# Patient Record
Sex: Female | Born: 1940 | Race: White | Hispanic: No | Marital: Married | Smoking: Former smoker
Health system: Southern US, Community
[De-identification: ages and names within clinical notes are randomized; demographics above are authoritative.]

## PROBLEM LIST (undated history)

## (undated) DIAGNOSIS — I422 Other hypertrophic cardiomyopathy: Secondary | ICD-10-CM

## (undated) DIAGNOSIS — R002 Palpitations: Secondary | ICD-10-CM

## (undated) DIAGNOSIS — I517 Cardiomegaly: Secondary | ICD-10-CM

## (undated) DIAGNOSIS — E669 Obesity, unspecified: Secondary | ICD-10-CM

## (undated) DIAGNOSIS — K219 Gastro-esophageal reflux disease without esophagitis: Secondary | ICD-10-CM

## (undated) DIAGNOSIS — K8020 Calculus of gallbladder without cholecystitis without obstruction: Secondary | ICD-10-CM

## (undated) DIAGNOSIS — K759 Inflammatory liver disease, unspecified: Secondary | ICD-10-CM

## (undated) DIAGNOSIS — I10 Essential (primary) hypertension: Secondary | ICD-10-CM

## (undated) DIAGNOSIS — K449 Diaphragmatic hernia without obstruction or gangrene: Secondary | ICD-10-CM

## (undated) DIAGNOSIS — K760 Fatty (change of) liver, not elsewhere classified: Secondary | ICD-10-CM

## (undated) DIAGNOSIS — I2510 Atherosclerotic heart disease of native coronary artery without angina pectoris: Secondary | ICD-10-CM

## (undated) DIAGNOSIS — E785 Hyperlipidemia, unspecified: Secondary | ICD-10-CM

## (undated) DIAGNOSIS — I4891 Unspecified atrial fibrillation: Secondary | ICD-10-CM

## (undated) DIAGNOSIS — R109 Unspecified abdominal pain: Secondary | ICD-10-CM

## (undated) DIAGNOSIS — G4730 Sleep apnea, unspecified: Secondary | ICD-10-CM

## (undated) DIAGNOSIS — M1990 Unspecified osteoarthritis, unspecified site: Secondary | ICD-10-CM

## (undated) HISTORY — DX: Diaphragmatic hernia without obstruction or gangrene: K44.9

## (undated) HISTORY — DX: Hyperlipidemia, unspecified: E78.5

## (undated) HISTORY — DX: Inflammatory liver disease, unspecified: K75.9

## (undated) HISTORY — DX: Calculus of gallbladder without cholecystitis without obstruction: K80.20

## (undated) HISTORY — DX: Palpitations: R00.2

## (undated) HISTORY — DX: Sleep apnea, unspecified: G47.30

## (undated) HISTORY — DX: Obesity, unspecified: E66.9

## (undated) HISTORY — DX: Cardiomegaly: I51.7

## (undated) HISTORY — PX: CHOLECYSTECTOMY: SHX55

## (undated) HISTORY — DX: Other hypertrophic cardiomyopathy: I42.2

## (undated) HISTORY — DX: Unspecified abdominal pain: R10.9

## (undated) HISTORY — PX: CATARACT EXTRACTION W/ INTRAOCULAR LENS IMPLANT: SHX1309

## (undated) HISTORY — PX: RETINAL DETACHMENT SURGERY: SHX105

## (undated) HISTORY — DX: Atherosclerotic heart disease of native coronary artery without angina pectoris: I25.10

## (undated) HISTORY — DX: Gastro-esophageal reflux disease without esophagitis: K21.9

## (undated) HISTORY — DX: Unspecified atrial fibrillation: I48.91

## (undated) HISTORY — DX: Fatty (change of) liver, not elsewhere classified: K76.0

## (undated) HISTORY — DX: Essential (primary) hypertension: I10

---

## 1998-11-09 ENCOUNTER — Other Ambulatory Visit: Admission: RE | Admit: 1998-11-09 | Discharge: 1998-11-09 | Payer: Self-pay | Admitting: Gynecology

## 2000-01-02 ENCOUNTER — Other Ambulatory Visit: Admission: RE | Admit: 2000-01-02 | Discharge: 2000-01-02 | Payer: Self-pay | Admitting: Gynecology

## 2000-01-29 ENCOUNTER — Encounter: Admission: RE | Admit: 2000-01-29 | Discharge: 2000-01-29 | Payer: Self-pay | Admitting: Gynecology

## 2000-01-29 ENCOUNTER — Encounter: Payer: Self-pay | Admitting: Gynecology

## 2001-01-20 ENCOUNTER — Other Ambulatory Visit: Admission: RE | Admit: 2001-01-20 | Discharge: 2001-01-20 | Payer: Self-pay | Admitting: Gynecology

## 2001-01-29 ENCOUNTER — Encounter: Payer: Self-pay | Admitting: Gynecology

## 2001-01-29 ENCOUNTER — Encounter: Admission: RE | Admit: 2001-01-29 | Discharge: 2001-01-29 | Payer: Self-pay | Admitting: Gynecology

## 2002-01-19 ENCOUNTER — Ambulatory Visit (HOSPITAL_BASED_OUTPATIENT_CLINIC_OR_DEPARTMENT_OTHER): Admission: RE | Admit: 2002-01-19 | Discharge: 2002-01-19 | Payer: Self-pay | Admitting: Internal Medicine

## 2002-01-22 ENCOUNTER — Other Ambulatory Visit: Admission: RE | Admit: 2002-01-22 | Discharge: 2002-01-22 | Payer: Self-pay | Admitting: Gynecology

## 2002-02-04 ENCOUNTER — Encounter: Admission: RE | Admit: 2002-02-04 | Discharge: 2002-02-04 | Payer: Self-pay | Admitting: Gynecology

## 2002-02-04 ENCOUNTER — Encounter: Payer: Self-pay | Admitting: Gynecology

## 2002-03-26 ENCOUNTER — Ambulatory Visit (HOSPITAL_COMMUNITY): Admission: RE | Admit: 2002-03-26 | Discharge: 2002-03-26 | Payer: Self-pay | Admitting: Gastroenterology

## 2002-12-03 HISTORY — PX: APPENDECTOMY: SHX54

## 2003-02-02 ENCOUNTER — Other Ambulatory Visit: Admission: RE | Admit: 2003-02-02 | Discharge: 2003-02-02 | Payer: Self-pay | Admitting: Gynecology

## 2003-02-08 ENCOUNTER — Encounter: Payer: Self-pay | Admitting: Gynecology

## 2003-02-08 ENCOUNTER — Encounter: Admission: RE | Admit: 2003-02-08 | Discharge: 2003-02-08 | Payer: Self-pay | Admitting: Gynecology

## 2003-11-27 ENCOUNTER — Encounter (INDEPENDENT_AMBULATORY_CARE_PROVIDER_SITE_OTHER): Payer: Self-pay | Admitting: Specialist

## 2003-11-27 ENCOUNTER — Inpatient Hospital Stay (HOSPITAL_COMMUNITY): Admission: EM | Admit: 2003-11-27 | Discharge: 2003-12-01 | Payer: Self-pay | Admitting: Emergency Medicine

## 2004-03-14 ENCOUNTER — Encounter: Admission: RE | Admit: 2004-03-14 | Discharge: 2004-03-14 | Payer: Self-pay | Admitting: Gynecology

## 2004-04-10 ENCOUNTER — Other Ambulatory Visit: Admission: RE | Admit: 2004-04-10 | Discharge: 2004-04-10 | Payer: Self-pay | Admitting: Gynecology

## 2004-08-15 HISTORY — PX: NM MYOCAR PERF WALL MOTION: HXRAD629

## 2005-03-15 ENCOUNTER — Encounter: Admission: RE | Admit: 2005-03-15 | Discharge: 2005-03-15 | Payer: Self-pay | Admitting: Gynecology

## 2005-04-16 ENCOUNTER — Other Ambulatory Visit: Admission: RE | Admit: 2005-04-16 | Discharge: 2005-04-16 | Payer: Self-pay | Admitting: Gynecology

## 2006-04-23 ENCOUNTER — Encounter: Admission: RE | Admit: 2006-04-23 | Discharge: 2006-04-23 | Payer: Self-pay | Admitting: Gynecology

## 2007-05-09 ENCOUNTER — Inpatient Hospital Stay (HOSPITAL_COMMUNITY): Admission: EM | Admit: 2007-05-09 | Discharge: 2007-05-13 | Payer: Self-pay | Admitting: Emergency Medicine

## 2007-05-12 ENCOUNTER — Encounter (INDEPENDENT_AMBULATORY_CARE_PROVIDER_SITE_OTHER): Payer: Self-pay | Admitting: Cardiology

## 2007-10-20 ENCOUNTER — Encounter: Admission: RE | Admit: 2007-10-20 | Discharge: 2007-10-20 | Payer: Self-pay | Admitting: Gynecology

## 2008-10-21 ENCOUNTER — Encounter: Admission: RE | Admit: 2008-10-21 | Discharge: 2008-10-21 | Payer: Self-pay | Admitting: Gynecology

## 2009-12-20 ENCOUNTER — Encounter: Admission: RE | Admit: 2009-12-20 | Discharge: 2009-12-20 | Payer: Self-pay | Admitting: Gynecology

## 2010-12-22 ENCOUNTER — Encounter
Admission: RE | Admit: 2010-12-22 | Discharge: 2010-12-22 | Payer: Self-pay | Source: Home / Self Care | Attending: Gynecology | Admitting: Gynecology

## 2010-12-24 ENCOUNTER — Encounter: Payer: Self-pay | Admitting: Gynecology

## 2011-02-01 ENCOUNTER — Other Ambulatory Visit: Payer: Self-pay | Admitting: Internal Medicine

## 2011-02-02 ENCOUNTER — Ambulatory Visit
Admission: RE | Admit: 2011-02-02 | Discharge: 2011-02-02 | Disposition: A | Discharge status: Home / Self Care | Payer: Medicare Other | Source: Ambulatory Visit | Attending: Internal Medicine | Admitting: Internal Medicine

## 2011-02-09 ENCOUNTER — Emergency Department (HOSPITAL_COMMUNITY)
Admission: EM | Admit: 2011-02-09 | Discharge: 2011-02-09 | Disposition: A | Discharge status: Home / Self Care | Payer: Medicare Other | Attending: Emergency Medicine | Admitting: Emergency Medicine

## 2011-02-09 ENCOUNTER — Emergency Department (HOSPITAL_COMMUNITY): Payer: Medicare Other

## 2011-02-09 DIAGNOSIS — K8020 Calculus of gallbladder without cholecystitis without obstruction: Secondary | ICD-10-CM | POA: Insufficient documentation

## 2011-02-09 DIAGNOSIS — I10 Essential (primary) hypertension: Secondary | ICD-10-CM | POA: Insufficient documentation

## 2011-02-09 DIAGNOSIS — I4891 Unspecified atrial fibrillation: Secondary | ICD-10-CM | POA: Insufficient documentation

## 2011-02-09 DIAGNOSIS — R079 Chest pain, unspecified: Secondary | ICD-10-CM | POA: Insufficient documentation

## 2011-02-09 DIAGNOSIS — F17200 Nicotine dependence, unspecified, uncomplicated: Secondary | ICD-10-CM | POA: Insufficient documentation

## 2011-02-09 DIAGNOSIS — E7800 Pure hypercholesterolemia, unspecified: Secondary | ICD-10-CM | POA: Insufficient documentation

## 2011-02-09 LAB — URINE MICROSCOPIC-ADD ON

## 2011-02-09 LAB — COMPREHENSIVE METABOLIC PANEL
Albumin: 4.2 g/dL (ref 3.5–5.2)
BUN: 15 mg/dL (ref 6–23)
Chloride: 102 mEq/L (ref 96–112)
Creatinine, Ser: 0.84 mg/dL (ref 0.4–1.2)
GFR calc Af Amer: >60 mL/min (ref >60)
Total Bilirubin: 0.5 mg/dL (ref 0.3–1.2)
Total Protein: 7.6 g/dL (ref 6.0–8.3)

## 2011-02-09 LAB — CBC
Hemoglobin: 15.6 g/dL — ABNORMAL HIGH (ref 12.0–15.0)
Platelets: 280 10*3/uL (ref 150–400)
RBC: 5.15 MIL/uL — ABNORMAL HIGH (ref 3.87–5.11)
RDW: 12.8 % (ref 11.5–15.5)

## 2011-02-09 LAB — TROPONIN I: Troponin I: 0.01 ng/mL (ref 0.00–0.06)

## 2011-02-09 LAB — URINALYSIS, ROUTINE W REFLEX MICROSCOPIC
Glucose, UA: NEGATIVE mg/dL
Hgb urine dipstick: NEGATIVE
Ketones, ur: NEGATIVE mg/dL
Nitrite: NEGATIVE
Specific Gravity, Urine: 1.023 (ref 1.005–1.030)
pH: 6.5 (ref 5.0–8.0)

## 2011-02-09 LAB — CK TOTAL AND CKMB (NOT AT ARMC)
CK, MB: 1.6 ng/mL (ref 0.3–4.0)
Relative Index: INVALID (ref 0.0–2.5)
Relative Index: INVALID (ref 0.0–2.5)
Total CK: 84 U/L (ref 7–177)

## 2011-02-09 LAB — DIFFERENTIAL
Eosinophils Absolute: 0.3 10*3/uL (ref 0.0–0.7)
Eosinophils Relative: 4 % (ref 0–5)
Lymphs Abs: 2.6 10*3/uL (ref 0.7–4.0)
Monocytes Absolute: 0.8 10*3/uL (ref 0.1–1.0)

## 2011-03-05 ENCOUNTER — Encounter (HOSPITAL_COMMUNITY): Payer: Medicare Other

## 2011-03-05 LAB — CBC
HCT: 46.2 % — ABNORMAL HIGH (ref 36.0–46.0)
MCV: 90.6 fL (ref 78.0–100.0)
RBC: 5.1 MIL/uL (ref 3.87–5.11)

## 2011-03-05 LAB — COMPREHENSIVE METABOLIC PANEL
ALT: 26 U/L (ref 0–35)
AST: 17 U/L (ref 0–37)
Albumin: 3.8 g/dL (ref 3.5–5.2)
BUN: 12 mg/dL (ref 6–23)
Calcium: 9.7 mg/dL (ref 8.4–10.5)
Chloride: 105 mEq/L (ref 96–112)
GFR calc Af Amer: >60 mL/min (ref >60)
Sodium: 143 mEq/L (ref 135–145)
Total Protein: 7 g/dL (ref 6.0–8.3)

## 2011-03-05 LAB — SURGICAL PCR SCREEN
MRSA, PCR: NEGATIVE
Staphylococcus aureus: NEGATIVE

## 2011-03-07 ENCOUNTER — Ambulatory Visit (HOSPITAL_COMMUNITY)
Admission: RE | Admit: 2011-03-07 | Discharge: 2011-03-08 | Disposition: A | Discharge status: Home / Self Care | Payer: Medicare Other | Source: Ambulatory Visit | Attending: Surgery | Admitting: Surgery

## 2011-03-07 ENCOUNTER — Other Ambulatory Visit: Payer: Self-pay | Admitting: Surgery

## 2011-03-07 DIAGNOSIS — K449 Diaphragmatic hernia without obstruction or gangrene: Secondary | ICD-10-CM | POA: Insufficient documentation

## 2011-03-07 DIAGNOSIS — K824 Cholesterolosis of gallbladder: Secondary | ICD-10-CM | POA: Insufficient documentation

## 2011-03-07 DIAGNOSIS — E7800 Pure hypercholesterolemia, unspecified: Secondary | ICD-10-CM | POA: Insufficient documentation

## 2011-03-07 DIAGNOSIS — G4733 Obstructive sleep apnea (adult) (pediatric): Secondary | ICD-10-CM | POA: Insufficient documentation

## 2011-03-07 DIAGNOSIS — I4891 Unspecified atrial fibrillation: Secondary | ICD-10-CM | POA: Insufficient documentation

## 2011-03-07 DIAGNOSIS — I10 Essential (primary) hypertension: Secondary | ICD-10-CM | POA: Insufficient documentation

## 2011-03-07 DIAGNOSIS — Z79899 Other long term (current) drug therapy: Secondary | ICD-10-CM | POA: Insufficient documentation

## 2011-03-07 DIAGNOSIS — E669 Obesity, unspecified: Secondary | ICD-10-CM | POA: Insufficient documentation

## 2011-03-07 DIAGNOSIS — K219 Gastro-esophageal reflux disease without esophagitis: Secondary | ICD-10-CM | POA: Insufficient documentation

## 2011-03-07 DIAGNOSIS — K8010 Calculus of gallbladder with chronic cholecystitis without obstruction: Secondary | ICD-10-CM | POA: Insufficient documentation

## 2011-03-13 NOTE — Op Note (Signed)
  NAME:  [name], [name] NO.:  [id number]  MEDICAL RECORD NO.:  [id number]           PATIENT TYPE:  O  LOCATION:  1537                         FACILITY:  [facility]  PHYSICIAN:  Abigail Miyamoto, M.D. DATE OF BIRTH:  [birth year]  DATE OF PROCEDURE:  03/07/2011 DATE OF DISCHARGE:                              OPERATIVE REPORT   PREOPERATIVE DIAGNOSIS:  Symptomatic cholelithiasis.  POSTOPERATIVE DIAGNOSIS:  Symptomatic cholelithiasis.  PROCEDURE:  Laparoscopic cholecystectomy.  SURGEON:  Abigail Miyamoto, M.D.  ASSISTANT:  Lennie Muckle, MD.  ANESTHESIA:  General and 0.5% Marcaine.  ESTIMATED BLOOD LOSS:  Minimal.  FINDINGS:  The patient found to have a chronically scarred appearing gallbladder with gallstones.  PROCEDURE IN DETAIL:  The patient was brought to the operative room, identified as Victoria Delgado.  She was placed supine on the operative room table and general anesthesia was induced.  Her abdomen was then prepped and draped in usual sterile fashion.  Using a #15 blade, a small vertical incision made above the umbilicus.  This was carried down the fascia which was then opened with scalpel.  Hemostat was then used to pass through the peritoneal cavity under direct vision.  Next, 0 Vicryl pursestring suture was placed around the fascial opening.  The [facility] port was placed through the opening and insufflation of the abdomen was begun.  A 5-mm port was then placed in the patient's epigastrium, two more in the right upper quadrant, all under direct vision.  The gallbladder was then grasped and retracted above the liver bed.  Several adhesions to the gallbladder were then taken down bluntly.  The duodenum was rolled up on the gallbladder and this peeled away easily and bluntly as well.  The cystic duct was then easily dissected out and a critical window was achieved around this, it was clipped 3 times proximally, once distally, and transected.  The cystic  artery was then identified and clipped several times proximally, distally, and transected as well.  The gallbladder was then dissected free from the liver bed with electrocautery.  Once it was free from liver bed, hemostasis was achieved on the liver bed with the electrocautery.  The gallbladder was then removed through the incision at the umbilicus once it was placed inside an Endosac.  A 0 Vicryl, umbilicus was tied in place closing the fascial defect.  The abdomen was then irrigated with normal saline. Again hemostasis appeared to be achieved.  All ports were removed under direct vision and the abdomen was deflated.  All incisions were anesthetized with Marcaine and closed with 4-0 Monocryl subcuticular sutures.  Steri-Strips and Band-Aids were then applied.  The patient tolerated the procedure well.  All sponge, needle, and instrument counts were correct at the end of the procedure. The patient was then extubated in the operating room and taken in stable condition to recovery room.     Abigail Miyamoto, M.D.     DB/MEDQ  D:  03/07/2011  T:  03/07/2011  Job:  [id number]  Electronically Signed by Abigail Miyamoto M.D. on 03/13/2011 03:53:51 PM

## 2011-04-17 NOTE — Cardiovascular Report (Signed)
NAME:  ANH, BIGOS NO.:  [id number]   MEDICAL RECORD NO.:  [id number]          PATIENT TYPE:  INP   LOCATION:  3737                         FACILITY:  [facility]   PHYSICIAN:  Nanetta Batty, M.D.   DATE OF BIRTH:  1941-06-17   DATE OF PROCEDURE:  05/12/2007  DATE OF DISCHARGE:                            CARDIAC CATHETERIZATION   Ms. Magaw is a 70 year old moderately overweight white female who was  admitted on May 09, 2007, with AF with RVR ultimately converting to  sinus rhythm and a small enzyme leak consistent with a non-Q-wave  myocardial infarction.  She was heparinized and presents now for  diagnostic coronary arteriography to define her anatomy and rule out  ischemic etiology.   DESCRIPTION OF PROCEDURE:  The patient brought to second floor [facility] in postabsorptive state.  She is premedicated with  p.o. Valium.  Her right groin was prepped and shaved in the usual  sterile fashion. We used 1% Xylocaine for local anesthesia.  A 6-French  sheath was inserted into the right femoral artery using standard  Seldinger technique.  The 6-French right and left  Judkins diagnostic  catheter as well as 6-French pigtail catheter was used for selective  cholangiography and left ventriculography, respectively.  Visipaque dye  was used for the entirety of the case.  Retrograde aortic, left  ventricular and pullback pressures were recorded.   HEMODYNAMIC RESULTS:  Aortic systolic pressure 167, diastolic pressure  83.  Left ventricular systolic pressure 168, end-diastolic pressure 14.   Selective coronary angiography:  1. Left main.  2. LAD; LAD had approximately 20% proximal segmental stenosis.  3. Left circumflex; normal.  4. Right coronary is dominant and normal.  5. Left ventriculography; RAO left ventriculogram was performed using      25 mL of Visipaque dye at 12 mL per second.  The overall LVEF was      estimated at greater than 60%  without focal wall motion      abnormality.   IMPRESSION:  Ms. Mexicano has essentially normal coronary arteries, normal  left ventricular function.  I believe her small enzyme leak was related  to her rapid heart rhythm in atrial fibrillation.  Medical therapy will  be recommended.  At this point, the patient does not require Coumadin  anticoagulation.   Sheath was removed and pressure was held on the groin to achieve  hemostasis.  The patient left the lab in stable condition.  She will be  discharged home later today and follow up with Dr. Julieanne Manson.      Nanetta Batty, M.D.  Electronically Signed     JB/MEDQ  D:  05/12/2007  T:  05/12/2007  Job:  [id number]   cc:   2nd Floor [facility]  [facility]  Candyce Churn, M.D.

## 2011-04-17 NOTE — Discharge Summary (Signed)
NAME:  Victoria Delgado, SCHUPP NO.:  [id number]   MEDICAL RECORD NO.:  [id number]          PATIENT TYPE:  INP   LOCATION:  3737                         FACILITY:  [facility]   PHYSICIAN:  Thereasa Solo. Little, M.D. DATE OF BIRTH:  September 29, 1941   DATE OF ADMISSION:  05/09/2007  DATE OF DISCHARGE:  05/12/2007                               DISCHARGE SUMMARY   DISCHARGE DIAGNOSES:  1. Atrial fibrillation with rapid ventricular response tachycardia,      resolved.  2. Mild elevation in troponin secondary to atrial fibrillation with      rapid ventricular tachycardia.  3. Cardiac catheterization was completed with normal coronary arteries      and normal left ventricle function.  4. Hypertension.  5. Hyperlipidemia.  6. Hiatal hernia.   DISCHARGE CONDITION:  Improved.   PROCEDURES:  On May 12, 2007, combined left heart cath by Dr. Nanetta Batty.   DISCHARGE MEDICATIONS:  1. Aspirin 81 mg daily.  2. Continue Zantac 150 mg twice a day.  3. Toprol XL 25 mg daily.  4. Cardizem CD 240 mg, one daily begin tonight at bedtime.  5. Multivitamin daily.  6. Fish oil 3 grams daily.  7. Vitamin C 1,000 mg daily.  8. Vitamin B12, one tablet daily.  9. Resume Crestor that she was going to take at home, one daily.  10.STOP Uniretic.   DISCHARGE INSTRUCTIONS:  1. Low-sodium heart-healthy diet.  2. Activity:  No activity much tonight, just rest and then increase      activity slowly.  3. May shower or bathe beginning tomorrow.  4. No lifting for 2 days.  5. No driving for 2 days.  6. Wash right groin cath site with soap and water.  Call if any      bleeding, swelling, or drainage.  7. Follow up with Dr. Clarene Duke, Wednesday, May 28, 2007 at 4 p.m.   HISTORY OF PRESENT ILLNESS:  This 70 year old white female with a known  history of atrial fibrillation and no previous coronary artery disease  was having episodes of palpitations over the last two and half to three  weeks.  She was seen by  her primary physician yesterday, Dr. Kevan Ny, and  sent in a Holter monitor.  On May 09, 2007, she was sitting outside  relaxing when suddenly she felt like her heart started racing.  She had  mild upper back and right shoulder discomfort.  She presented to [facility] ER, was in Afib with rapid ventricular response and started on  Cardizem.  She converted to a sinus rhythm in the next morning.  She was  seen by Dr. Elsie Lincoln.  In addition to her Cardizem drip, she was placed on  heparin IV.  Over the weekend, she had converted to sinus rhythm and  maintained sinus rhythm.  Her Uniretic was held, and she was placed on  Toprol or beta blocker and Cardizem.  They have been changed to long-  acting dosages today.  She continued to be stable and by this morning  she underwent a heart cath with normal __________  and normal LV  function.  It was believed that the elevated troponin was due to a  slight enzymes __________  secondary to Afib with rapid ventricular  response.  She will be stable.  If she has any problems prior to  discharge, she will be kept another 24 hours but for now she is stable  and can be discharged home if she ambulates without problems.   PAST MEDICAL HISTORY:  As stated.   FAMILY HISTORY:  See H&P.   SOCIAL HISTORY:  See H&P.   REVIEW OF SYSTEMS:  See H&P.   PHYSICAL EXAMINATION:  VITAL SIGNS:  At discharge blood pressure is  133/75, pulse 66, respirations 20, temp 96.9, room air oxygen saturation  98%.  HEART:  Regular rate and rhythm.  LUNGS:  Clear.   Chest x-ray on admission:  No acute findings.   LABORATORY DATA:  Hemoglobin ran 14.6 on admission with a hematocrit of  43.4, WBC 6.9, platelets 286.  These were maintained and stable at time  of discharge.  Chemistry on admission sodium 139, potassium 3.5 that was  replaced, chloride 103, CO2 26, BUN 13, creatinine 0.81, glucose 118.  These remained stable throughout hospitalization.  Coags:  Pro time  12.3, INR  0.9.  On heparin, she was therapeutic.  GI:  LFTs were normal.  AST 20, ALT 31, alkaline phos 76, total bili  0.5.  Enzymes:  Initial CK 0.05, CK 137, MB 4.8.  CKs went up to 12/1939  with an MB of 10.6, at discharge 86 with an MB of 0.39, and a peak  troponin was 0.54 and at discharge 0.27.  Magnesium 2.3.  Calcium was  normal 9.4.  UA was clear and TSH was 2.062.   A 2-D echo was done.  It had not been read at time of discharge.  Because of normal LV function on cardiac cath, she was felt stable to be  discharged.   The patient will follow up with Dr. Clarene Duke as described.  The 2-D echo  will be read.  If there are any problems, we will contact her.  She  asked about going to [facility].  I felt it was okay as long as she  would get out and walk around her car several times on the [place] to [place] and again no heavy lifting, no picking up her 56-year-old for a  week or so.  Her family was in the room when I reviewed her discharge  instructions.      [name], N.P.    ______________________________  Thereasa Solo Little, M.D.    LRI/MEDQ  D:  05/12/2007  T:  05/12/2007  Job:  [id number]   cc:   Kevan Ny, Dr.  Ike Bene, M.D.

## 2011-04-17 NOTE — Discharge Summary (Signed)
NAME:  Victoria Delgado, Victoria Delgado NO.:  [id number]   MEDICAL RECORD NO.:  [id number]          PATIENT TYPE:  INP   LOCATION:  3737                         FACILITY:  [facility]   PHYSICIAN:  Thereasa Solo. Little, M.D. DATE OF BIRTH:  1941/06/20   DATE OF ADMISSION:  05/09/2007  DATE OF DISCHARGE:                               DISCHARGE SUMMARY   ADDENDUM DISCHARGE SUMMARY:   DISCHARGE DATE:  Had been May 12, 2007, but we actually kept her until  May 13, 2007.   DISCHARGE DIAGNOSIS:  Right groin bleed was reason for another keeping  overnight after a cardiac catheterization.   [name], please see full discharge summary, but she was to go home on  May 12, 2007 after a heart cath.  When she walked in the hallway, she  had some bleeding in her right groin site.  Patient was held again and  she was stable.  She had no problems during the night and by May 13, 2007.  She ambulating in the hall with no further bleeding.  She was  seen and discharged by Dr. Domingo Sep.      [name], N.P.    ______________________________  Thereasa Solo Little, M.D.    LRI/MEDQ  D:  05/13/2007  T:  05/13/2007  Job:  [id number]

## 2011-04-18 ENCOUNTER — Encounter (INDEPENDENT_AMBULATORY_CARE_PROVIDER_SITE_OTHER): Payer: Self-pay | Admitting: Surgery

## 2011-04-20 NOTE — H&P (Signed)
NAME:  Victoria Delgado, Victoria Delgado NO.:  [id number]   MEDICAL RECORD NO.:  [id number]                   PATIENT TYPE:  EMS   LOCATION:  MAJO                                 FACILITY:  [facility]   PHYSICIAN:  Abigail Miyamoto, M.D.              DATE OF BIRTH:  11-Nov-1941   DATE OF ADMISSION:  11/27/2003  DATE OF DISCHARGE:                                HISTORY & PHYSICAL   CHIEF COMPLAINT:  Abdominal pain.   HISTORY:  This is a 70 year old female who presents with a two to three-day  history of abdominal pain.  She reports it was vague across her lower  abdomen and is now isolated in the right lower quadrant.  She has had nausea  with this, but no vomiting.  She is moving her bowels and has had no  dysuria.  She reports that she did have one episode of chest pain hurting to  her left arm, but has had no previous history of myocardial infarction.  This has since resolved.  She denies any fever or shortness of breath.   PAST MEDICAL HISTORY:  Significant for:  1. Hypertension.  2. Hiatal hernia.  3. Hypercholesterolemia.   MEDICATIONS:  Lipitor, Uniretic, and Zantac.   ALLERGIES:  She is allergic to PENICILLIN which caused her to have an  allergic reaction around a shot site and to CODEINE.   SOCIAL HISTORY:  She quit smoking 15 years ago and does not drink alcohol.   REVIEW OF SYSTEMS:  Otherwise negative.   PHYSICAL EXAMINATION:  GENERAL APPEARANCE:  An obese female in no acute  distress.  VITAL SIGNS:  The temperature is 99.7 degrees, blood pressure 185/76, heart  rate 120, respiratory rate 20, and she is saturating 98% on room air.  HEENT:  She is anicteric.  Pupils are reactive bilaterally.  Ears, Nose,  Mouth, and Throat:  The external ears and nose are normal.  Hearing is  normal.  The oropharynx is clear.  NECK:  Supple.  There is no cervical adenopathy.  There is no thyromegaly.  LUNGS:  Clear to auscultation bilaterally with normal respiratory  effort.  CARDIAC:  Regular rate and rhythm with no murmurs.  There is no peripheral  edema.  ABDOMEN:  Soft with tenderness and guarding in the right lower quadrant.  There are no masses.  There are no hernias.  EXTREMITIES:  Warm and well perfused.   LABORATORY DATA:  The patient's has a white blood count of 9.8, a hemoglobin  of 13.6, a hematocrit of 38.3, platelets of 255, and neutrophils are 75%.  Urinalysis shows her to have trace leukocyte esterase, otherwise  unremarkable.  The BUN is 19, the creatinine is 0.8, and the glucose is 125.  The patient has a CT scan of the abdomen and pelvis which showed  inflammation and fluid in the right lower quadrant and nonvisualization of  the appendix consistent  with acute appendicitis.   IMPRESSION AND PLAN:  This is a patient with right lower quadrant abdominal  tenderness and findings on CT scan consistent with acute appendicitis.  At  this point the decision has been made to proceed to the operating room for  appendectomy.  We will attempt a laparoscopic approach.  I will discuss this  with the patient in detail, including the risks of bleeding, infection, and  chance of finding a normal appendix, as well as chance for open surgery.  At  this point she wishes to proceed.  The surgery will be performed emergently.                                                Abigail Miyamoto, M.D.    DB/MEDQ  D:  11/27/2003  T:  11/28/2003  Job:  [id number]

## 2011-04-20 NOTE — Discharge Summary (Signed)
NAME:  Victoria Delgado, Victoria Delgado                           ACCOUNT NO.:  [id number]   MEDICAL RECORD NO.:  [id number]                   PATIENT TYPE:  INP   LOCATION:  5741                                 FACILITY:  [facility]   PHYSICIAN:  Abigail Miyamoto, M.D.              DATE OF BIRTH:  1941/02/04   DATE OF ADMISSION:  11/27/2003  DATE OF DISCHARGE:  12/01/2003                                 DISCHARGE SUMMARY   SUMMARY OF HISTORY:  Ms. Osterloh is a 70 year old white female who presented  to the emergency department with abdominal pain and nausea.  She was found  to have a white blood count of 9000 but with a left shift.  She had a CAT  scan of the abdomen and pelvic that showed probable appendicitis.   HOSPITAL COURSE:  The patient was admitted and taken to the operating room,  where she underwent a laparoscopic appendectomy and was found to have a  perforated appendicitis with small abscess.  Postoperatively, she was taken  to the regular surgical floor for IV antibiotics.  Over the next several  days, she was followed closely for this.  She did have mild hypokalemia  which was replaced IV and she was started back on her home medications as  her blood pressure increased.  Her white blood count remained normal at  8000.  She did have a fever on postoperative day 3 but it was felt to be  secondary to atelectasis.  By postoperative day 4, she was having minimal  discomfort.  She was afebrile.  Her incisions were healing well.  At this  point, decision was made to discharge the patient to home on oral  antibiotics.   DISCHARGE DIAGNOSES:  1. Acute perforated appendicitis, status post laparoscopic appendectomy.  2. She also had a history of:     a. Hypokalemia.     b. Hypertension.   DISCHARGE DIET:  Regular.   DISCHARGE ACTIVITY:  She is to do no heavy lifting greater than 20 pounds  for 3 weeks.   WOUND CARE:  She may shower.   DISCHARGE MEDICATIONS:  1. Discharge medications include  Percocet and Advil and Tylenol for pain.  2. She will take Cipro 500 mg b.i.d.  3. She will take Phenergan for nausea.  4. She will resume the rest of her home medications.   FOLLOWUP:  She will follow up in my office in 1 week post discharge.                                                Abigail Miyamoto, M.D.    DB/MEDQ  D:  01/03/2004  T:  01/03/2004  Job:  [id number]

## 2011-04-20 NOTE — Op Note (Signed)
NAME:  Victoria Delgado, Victoria Delgado                           ACCOUNT NO.:  [id number]   MEDICAL RECORD NO.:  [id number]                   PATIENT TYPE:  INP   LOCATION:  1824                                 FACILITY:  [facility]   PHYSICIAN:  Abigail Miyamoto, M.D.              DATE OF BIRTH:  July 09, 1941   DATE OF PROCEDURE:  11/27/2003  DATE OF DISCHARGE:                                 OPERATIVE REPORT   PREOPERATIVE DIAGNOSIS:  Acute appendicitis.   POSTOPERATIVE DIAGNOSIS:  Perforated appendicitis.   PROCEDURE:  Laparoscopic appendectomy.   SURGEON:  Abigail Miyamoto, M.D.   ANESTHESIA:  General endotracheal anesthesia with 0.25% Marcaine.   ESTIMATED BLOOD LOSS:  Minimal.   FINDINGS:  The patient was found to have a ruptured appendix at the base and  a small  focal abscess. The appendix was retrocecal with a necrotic base.   DESCRIPTION OF PROCEDURE:  The patient was brought to the operating room and  identified  as Victoria Delgado. She was placed in the supine position on the  operating table and general anesthesia was induced. Her abdomen was then  prepped and draped in the usual sterile fashion.   Using a #15 blade a small transverse incision was made below the umbilicus  and this was carried down to the fascia which was then opened with the  scalpel. A hemostat was used to pass through the peritoneal cavity. A 0  Vicryl pursestring suture was then placed around the fascial opening. The  Hasson port was then placed through opening and  insufflation of the abdomen  was begun.   A 5-mm port was then placed in the patient's right upper quadrant and a 12-  mm port was placed to exit the suprapubic area under direct vision. The  appendix was found to be acutely inflamed and slightly retrocecal. The small  bowel that was stuck to the inflamed appendix was taken down bluntly. A  small abscess cavity adjacent to the appendix and the retroperitoneum was  entered and pus was suctioned from  this.   The mesoappendix was finally identified  and controlled with the harmonic  scalpel. The base of the appendix was found to be necrotic and slightly more  retrocecal. The cecum had to be mobilized along the white line of Toldt with  the electrocautery. A phlegmon was found in this area.   The base could finally  be identified, however, the appendix tore off. The  base was controlled further, and then was finally transected with 2 separate  firings of the GIA endoscopic stapling device. The cecum was examined and  the staple lines appeared  to be secure over  the base of the appendix. The  appendix and the 2 pieces were then placed in an endo sac and removed  through the incision at the umbilicus.   The right lower quadrant was then irrigated with a liter of saline. The  staple site was again reexamined and found to be secure. Hemostasis was also  felt to be achieved. No further  purulence was identified. At this point all  ports were removed under direct vision and the abdomen was deflated.   The 0 Vicryl at the umbilicus  was tied in place, closing the fascial  defect. All port sites were anesthetized with 0.25% Marcaine and closed with  4-0 Monocryl subcuticular sutures. Steri-Strips, gauze and tape were then  applied.   The patient tolerated the procedure well. All sponge, instrument and needle  counts were correct at the end of the procedure. The patient was then  extubated in the operating room and taken in stable condition to the  recovery room.                                               Abigail Miyamoto, M.D.    DB/MEDQ  D:  11/27/2003  T:  11/28/2003  Job:  [id number]

## 2011-04-20 NOTE — Procedures (Signed)
[place]  Patient:    Victoria Delgado, Victoria Delgado Visit Number: [id number] MRN: [id number]          Service Type: END Location: ENDO Attending Physician:  Dennison Bulla Ii Dictated by:   Verlin Grills, M.D. Proc. Date: 03/26/02 Admit Date:  03/26/2002 Discharge Date: 03/26/2002   CC:         Jerl Santos, M.D.   Procedure Report  REFERRING PHYSICIAN:  Jerl Santos, M.D., [facility].  PROCEDURE:  Screening colonoscopy.  PROCEDURE INDICATION:  [name] is a 70 year old female born [birth year].  [name] is referred for her first screening colonoscopy with polypectomy to prevent colon cancer.  I discussed with [name] the complications associated with colonoscopy and polypectomy, including a 15 per thousand risk of bleeding and four per thousand risk of colon perforation requiring surgical repair.  [name] has signed the operative permit.  ENDOSCOPIST:  Verlin Grills, M.D.  PREMEDICATION:  Versed 7 mg, fentanyl 50 mcg.  ENDOSCOPE:  Olympus pediatric colonoscope.  DESCRIPTION OF PROCEDURE:  After obtaining informed consent, [name] was placed in the left lateral decubitus position.  I administered intravenous fentanyl and intravenous Versed to achieve conscious sedation for the procedure.  The patients blood pressure, oxygen saturation, and cardiac rhythm were monitored throughout the procedure and documented in the medical record.  Anal inspection was normal.  Digital rectal exam was normal.  The Olympus pediatric video colonoscope was introduced into the rectum and easily advanced to the cecum.  Colonic preparation for the exam today was excellent.  Rectum normal.  Sigmoid colon and descending colon normal.  Splenic flexure normal.  Transverse colon normal.  Hepatic flexure normal.  Ascending colon normal.  Cecum and ileocecal valve normal.  ASSESSMENT:  Normal  screening proctocolonoscopy to the cecum.  No endoscopic evidence for the presence of colorectal neoplasia. Dictated by:   Verlin Grills, M.D. Attending Physician:  Dennison Bulla Ii DD:  03/26/02 TD:  03/26/02 Job: [id number]

## 2011-04-24 ENCOUNTER — Encounter (INDEPENDENT_AMBULATORY_CARE_PROVIDER_SITE_OTHER): Payer: Self-pay | Admitting: Surgery

## 2011-09-20 LAB — CK TOTAL AND CKMB (NOT AT ARMC)
CK, MB: 4.8 — ABNORMAL HIGH
Relative Index: 3.5 — ABNORMAL HIGH
Total CK: 137

## 2011-09-20 LAB — LIPID PANEL
HDL: 40
LDL Cholesterol: 192 — ABNORMAL HIGH
Total CHOL/HDL Ratio: 7.5
Triglycerides: 337 — ABNORMAL HIGH
VLDL: 67 — ABNORMAL HIGH

## 2011-09-20 LAB — CBC
HCT: 40.4
HCT: 40.7
HCT: 41.8
HCT: 42.9
Hemoglobin: 13.6
Hemoglobin: 14
Hemoglobin: 14.1
Hemoglobin: 14.3
Hemoglobin: 14.6
MCHC: 33.9
MCHC: 34.4
MCV: 88.9
MCV: 91.7
MCV: 91.8
Platelets: 246
Platelets: 257
RBC: 4.56
RBC: 4.57
RBC: 4.81
RDW: 13.2
RDW: 13.4
RDW: 13.4
RDW: 13.9
WBC: 6
WBC: 7.5

## 2011-09-20 LAB — COMPREHENSIVE METABOLIC PANEL
Albumin: 3.7
Alkaline Phosphatase: 76
BUN: 13
Calcium: 9.3
Creatinine, Ser: 0.81
Potassium: 3.5
Total Protein: 6.7

## 2011-09-20 LAB — PROTIME-INR
INR: 0.9
Prothrombin Time: 12.3
Prothrombin Time: 12.3

## 2011-09-20 LAB — POCT I-STAT CREATININE
Creatinine, Ser: 0.9
Operator id: 189501

## 2011-09-20 LAB — BASIC METABOLIC PANEL
BUN: 12
CO2: 30
Chloride: 105
Chloride: 107
GFR calc Af Amer: >60
Glucose, Bld: 106 — ABNORMAL HIGH
Glucose, Bld: 113 — ABNORMAL HIGH
Potassium: 4.2
Sodium: 142
Sodium: 143

## 2011-09-20 LAB — CARDIAC PANEL(CRET KIN+CKTOT+MB+TROPI)
CK, MB: 10.6 — ABNORMAL HIGH
CK, MB: 3.9
CK, MB: 8.4 — ABNORMAL HIGH
Relative Index: 7.5 — ABNORMAL HIGH
Total CK: 115
Troponin I: 0.27 — ABNORMAL HIGH
Troponin I: 0.37 — ABNORMAL HIGH

## 2011-09-20 LAB — APTT: aPTT: 28

## 2011-09-20 LAB — URINALYSIS, ROUTINE W REFLEX MICROSCOPIC
Bilirubin Urine: NEGATIVE
Glucose, UA: NEGATIVE
Hgb urine dipstick: NEGATIVE
Ketones, ur: NEGATIVE
Specific Gravity, Urine: 1.011
pH: 6

## 2011-09-20 LAB — HEPARIN LEVEL (UNFRACTIONATED): Heparin Unfractionated: 0.75 — ABNORMAL HIGH

## 2011-09-20 LAB — I-STAT 8, (EC8 V) (CONVERTED LAB)
BUN: 14
Chloride: 109
HCT: 44
pCO2, Ven: 37.5 — ABNORMAL LOW
pH, Ven: 7.451 — ABNORMAL HIGH

## 2011-09-20 LAB — POCT CARDIAC MARKERS: Operator id: 189501

## 2011-11-21 ENCOUNTER — Other Ambulatory Visit: Payer: Self-pay | Admitting: Gynecology

## 2011-11-21 DIAGNOSIS — Z1231 Encounter for screening mammogram for malignant neoplasm of breast: Secondary | ICD-10-CM

## 2011-12-24 ENCOUNTER — Ambulatory Visit
Admission: RE | Admit: 2011-12-24 | Discharge: 2011-12-24 | Disposition: A | Discharge status: Home / Self Care | Payer: Medicare Other | Source: Ambulatory Visit | Attending: Gynecology | Admitting: Gynecology

## 2011-12-24 DIAGNOSIS — Z1231 Encounter for screening mammogram for malignant neoplasm of breast: Secondary | ICD-10-CM

## 2012-02-18 ENCOUNTER — Other Ambulatory Visit: Payer: Self-pay

## 2012-06-25 HISTORY — PX: US ECHOCARDIOGRAPHY: HXRAD669

## 2012-08-27 ENCOUNTER — Other Ambulatory Visit: Payer: Self-pay | Admitting: Dermatology

## 2012-10-09 ENCOUNTER — Institutional Professional Consult (permissible substitution): Payer: Medicare Other | Admitting: Cardiology

## 2012-10-16 ENCOUNTER — Encounter: Payer: Self-pay | Admitting: Cardiology

## 2012-10-16 ENCOUNTER — Ambulatory Visit (INDEPENDENT_AMBULATORY_CARE_PROVIDER_SITE_OTHER): Payer: Medicare Other | Admitting: Cardiology

## 2012-10-16 VITALS — BP 156/82 | HR 55 | Ht 68.0 in | Wt 270.8 lb

## 2012-10-16 DIAGNOSIS — I421 Obstructive hypertrophic cardiomyopathy: Secondary | ICD-10-CM | POA: Insufficient documentation

## 2012-10-16 DIAGNOSIS — R002 Palpitations: Secondary | ICD-10-CM | POA: Insufficient documentation

## 2012-10-16 DIAGNOSIS — I10 Essential (primary) hypertension: Secondary | ICD-10-CM | POA: Insufficient documentation

## 2012-10-16 DIAGNOSIS — G4730 Sleep apnea, unspecified: Secondary | ICD-10-CM

## 2012-10-16 MED ORDER — FUROSEMIDE 20 MG PO TABS: 20.0000 mg | ORAL_TABLET | Freq: Every day | ORAL | Status: DC

## 2012-10-16 MED ORDER — TOPROL XL 50 MG PO TB24: ORAL_TABLET | ORAL | Status: DC

## 2012-10-16 MED ORDER — DILTIAZEM HCL ER COATED BEADS 240 MG PO CP24: 240.0000 mg | ORAL_CAPSULE | Freq: Every day | ORAL | Status: DC

## 2012-10-16 NOTE — Progress Notes (Signed)
HPI The patient presents as a new patient for me. She has seen by another group. She was found in the past to have atrial fibrillation which she ultimately thinks is related to an arthritis shot. This was in 2008. At that time she was found to have asymmetric septal hypertrophy. This has been followed conservatively. Her last echocardiogram in July of this year demonstrated a peak gradient of 35 with the Valsalva maneuver. The EF was greater than 70%. She has had a history of long-standing isolated palpitations. She apparently had some supraventricular tachycardia in the past but she was sick with gallstones at that time. She otherwise says she rarely feels any palpitations. She does not have any chest pressure, neck or arm discomfort. She denies any shortness of breath, PND or orthopnea. She's had a slow weight gain over the years. She does have some mild lower extremity edema.  Allergies  Allergen Reactions  . Codeine   . Kenalog (Triamcinolone Acetonide)   . Penicillins     Current Outpatient Prescriptions  Medication Sig Dispense Refill  . ASPIRIN PO Take 25 mg by mouth.        . diltiazem (CARDIZEM CD) 240 MG 24 hr capsule Take 1 capsule (240 mg total) by mouth daily.  30 capsule  11  . furosemide (LASIX) 20 MG tablet Take 1 tablet (20 mg total) by mouth daily.  30 tablet  11  . GARLIC PO Take by mouth.        . hydrochlorothiazide 25 MG tablet Take 25 mg by mouth daily.        . ranitidine (ZANTAC) 150 MG tablet Take 150 mg by mouth 2 (two) times daily.      . TOPROL XL 50 MG 24 hr tablet Take a 1 tab in am and 1/2 of 50 mg in pm  45 tablet  11  . [DISCONTINUED] diltiazem (CARDIZEM CD) 240 MG 24 hr capsule Take 240 mg by mouth daily.        . [DISCONTINUED] furosemide (LASIX) 20 MG tablet       . [DISCONTINUED] TOPROL XL 50 MG 24 hr tablet Take 50 mg by mouth. Take a 1 tab in am and 1/2 of 50 mg in pm        Past Medical History  Diagnosis Date  . Hepatitis   . Glaucoma   .  Cataract   . Hypertension   . GERD (gastroesophageal reflux disease)   . Hyperlipidemia   . Sleep apnea     CPAP  . Hernia, hiatal   . Heart palpitations     Past Surgical History  Procedure Date  . Appendectomy 2004  . Cholecystectomy   . Retinal detachment surgery     Family History  Problem Relation Age of Onset  . Cancer Father 51  . Hypertension Mother   . CVA Mother     History   Social History  . Marital Status: Married    Spouse Name: N/A    Number of Children: 2  . Years of Education: N/A   Occupational History  .  Other   Social History Main Topics  . Smoking status: Former Games developer  . Smokeless tobacco: Not on file     Comment: QUIT IN 1990  . Alcohol Use:   . Drug Use:   . Sexually Active:    Other Topics Concern  . Not on file   Social History Narrative   Two grandchildren.    ROS:  Positive for hip pain, back pain, seasonal allergies, constipation, some difficulty swallowing and a hiatal hernia. Otherwise as stated in the HPI and negative for all other systems.  PHYSICAL EXAM BP 156/82  Pulse 55  Ht 5\' 8"  (1.727 m)  Wt 270 lb 12.8 oz (122.834 kg)  BMI 41.17 kg/m2 GENERAL:  Well appearing HEENT:  Pupils equal round and reactive, fundi not visualized, oral mucosa unremarkable NECK:  No jugular venous distention, waveform within normal limits, carotid upstroke brisk and symmetric, no bruits, no thyromegaly LYMPHATICS:  No cervical, inguinal adenopathy LUNGS:  Clear to auscultation bilaterally BACK:  No CVA tenderness CHEST:  Unremarkable HEART:  PMI not displaced or sustained,S1 and S2 within normal limits, no S3, no S4, no clicks, no rubs, 2/6 systolic murmur radiating out the aortic outflow tract and into the carotids not increasing with a strain phase of Valsalva, no diastolic murmurs ABD:  Flat, positive bowel sounds normal in frequency in pitch, no bruits, no rebound, no guarding, no midline pulsatile mass, no hepatomegaly, no  splenomegaly, obese EXT:  2 plus pulses throughout, trace edema, no cyanosis no clubbing SKIN:  No rashes no nodules NEURO:  Cranial nerves II through XII grossly intact, motor grossly intact throughout PSYCH:  Cognitively intact, oriented to person place and time   EKG:  Sinus rhythm, rate 55, axis within normal limits, intervals within normal limits, poor anterior R wave progression, no acute ST-T wave changes.  10/16/2012  ASSESSMENT AND PLAN  Hypertrophic cardiomyopathy - She has a mild gradient of the symptoms related to this. I will repeat an echocardiogram for mild baseline in about 6 months. For now the management will be salt restriction fluid restriction and hypertension. We discussed the symptoms that could develop if this gets worse  Dyslipidemia - She does not recall her LDL but says that she does have problems taking statins. Therefore she will come off of the Lipitor watch her diet and we will repeat a lipid profile in about 10 weeks.  Palpitations - She is rarely bothered by these. No change in therapy is indicated.  Overweight - We had a long discussion about this and she counts calories and I recommended the [employer] as well.  Hypertension - Her blood pressure is slightly elevated today but she says it otherwise as well controlled less than 140 systolic. Therefore, no change in therapy or further evaluation is indicated.

## 2012-10-16 NOTE — Patient Instructions (Addendum)
Your physician wants you to follow-up in: 6 months with Dr. Antoine Poche. You will receive a reminder letter in the mail two months in advance. If you don't receive a letter, please call our office to schedule the follow-up appointment.  Your physician has requested that you have an echocardiogram at your 6 month visit. Echocardiography is a painless test that uses sound waves to create images of your heart. It provides your doctor with information about the size and shape of your heart and how well your heart's chambers and valves are working. This procedure takes approximately one hour. There are no restrictions for this procedure.  STOP Lipitor.

## 2012-11-15 ENCOUNTER — Emergency Department (HOSPITAL_BASED_OUTPATIENT_CLINIC_OR_DEPARTMENT_OTHER)
Admission: EM | Admit: 2012-11-15 | Discharge: 2012-11-15 | Disposition: A | Discharge status: Home / Self Care | Payer: Medicare Other | Attending: Emergency Medicine | Admitting: Emergency Medicine

## 2012-11-15 ENCOUNTER — Encounter (HOSPITAL_BASED_OUTPATIENT_CLINIC_OR_DEPARTMENT_OTHER): Payer: Self-pay | Admitting: Emergency Medicine

## 2012-11-15 DIAGNOSIS — L0390 Cellulitis, unspecified: Secondary | ICD-10-CM

## 2012-11-15 DIAGNOSIS — G4730 Sleep apnea, unspecified: Secondary | ICD-10-CM | POA: Insufficient documentation

## 2012-11-15 DIAGNOSIS — Z8619 Personal history of other infectious and parasitic diseases: Secondary | ICD-10-CM | POA: Insufficient documentation

## 2012-11-15 DIAGNOSIS — Z8719 Personal history of other diseases of the digestive system: Secondary | ICD-10-CM | POA: Insufficient documentation

## 2012-11-15 DIAGNOSIS — I10 Essential (primary) hypertension: Secondary | ICD-10-CM | POA: Insufficient documentation

## 2012-11-15 DIAGNOSIS — Z9089 Acquired absence of other organs: Secondary | ICD-10-CM | POA: Insufficient documentation

## 2012-11-15 DIAGNOSIS — Z9989 Dependence on other enabling machines and devices: Secondary | ICD-10-CM | POA: Insufficient documentation

## 2012-11-15 DIAGNOSIS — E785 Hyperlipidemia, unspecified: Secondary | ICD-10-CM | POA: Insufficient documentation

## 2012-11-15 DIAGNOSIS — Z9849 Cataract extraction status, unspecified eye: Secondary | ICD-10-CM | POA: Insufficient documentation

## 2012-11-15 DIAGNOSIS — Z9889 Other specified postprocedural states: Secondary | ICD-10-CM | POA: Insufficient documentation

## 2012-11-15 DIAGNOSIS — Z79899 Other long term (current) drug therapy: Secondary | ICD-10-CM | POA: Insufficient documentation

## 2012-11-15 DIAGNOSIS — L03119 Cellulitis of unspecified part of limb: Secondary | ICD-10-CM | POA: Insufficient documentation

## 2012-11-15 DIAGNOSIS — Z87891 Personal history of nicotine dependence: Secondary | ICD-10-CM | POA: Insufficient documentation

## 2012-11-15 DIAGNOSIS — L02419 Cutaneous abscess of limb, unspecified: Secondary | ICD-10-CM | POA: Insufficient documentation

## 2012-11-15 DIAGNOSIS — L539 Erythematous condition, unspecified: Secondary | ICD-10-CM | POA: Insufficient documentation

## 2012-11-15 DIAGNOSIS — Z7982 Long term (current) use of aspirin: Secondary | ICD-10-CM | POA: Insufficient documentation

## 2012-11-15 MED ORDER — DOXYCYCLINE HYCLATE 100 MG PO CAPS: 100.0000 mg | ORAL_CAPSULE | Freq: Two times a day (BID) | ORAL | Status: DC

## 2012-11-15 NOTE — ED Provider Notes (Signed)
History     CSN: [id number]  Arrival date & time 11/15/12  1115   First MD Initiated Contact with Patient 11/15/12 1148      Chief Complaint  Patient presents with  . Skin Discoloration     Patient is a 71 y.o. female presenting with leg pain. The history is provided by the patient.  Leg Pain  The incident occurred more than 2 days ago. There was no injury mechanism. Pain location: left lateral malleolus. The quality of the pain is described as aching. The pain is mild. The pain has been improving since onset. Pertinent negatives include no loss of motion, no muscle weakness and no loss of sensation. The symptoms are aggravated by activity. She has tried rest for the symptoms. The treatment provided moderate relief.  pt reports insidious onset of left lateral malleolus pain/redness No trauma She can ambulate No h/o trauma or surgery to ankle No h/o dvt/pe No recent travel/surgery She is not on estrogen products She reports the redness is improving She does have h/o cellulitis in other areas previously  Past Medical History  Diagnosis Date  . Hepatitis   . Cataract   . Hypertension   . GERD (gastroesophageal reflux disease)   . Hyperlipidemia   . Sleep apnea     CPAP  . Hernia, hiatal   . Heart palpitations     Past Surgical History  Procedure Date  . Appendectomy 2004  . Cholecystectomy   . Retinal detachment surgery   . Cataract extraction w/ intraocular lens implant     both eyes    Family History  Problem Relation Age of Onset  . Cancer Father 91  . Hypertension Mother   . CVA Mother     History  Substance Use Topics  . Smoking status: Former Games developer  . Smokeless tobacco: Not on file     Comment: QUIT IN 1990  . Alcohol Use:     OB History    Grav Para Term Preterm Abortions TAB SAB Ect Mult Living                  Review of Systems  Constitutional: Negative for fever.  Respiratory: Negative for chest tightness and shortness of breath.    Cardiovascular: Negative for chest pain.  Gastrointestinal: Negative for abdominal pain.  Genitourinary: Negative for flank pain.  Musculoskeletal: Negative for back pain.  Skin: Positive for color change.  Neurological: Negative for weakness.  Psychiatric/Behavioral: Negative for agitation.  All other systems reviewed and are negative.    Allergies  Codeine; Kenalog; and Penicillins  Home Medications   Current Outpatient Rx  Name  Route  Sig  Dispense  Refill  . ASPIRIN PO   Oral   Take 81 mg by mouth.          Marland Kitchen DILTIAZEM HCL ER COATED BEADS 240 MG PO CP24   Oral   Take 1 capsule (240 mg total) by mouth daily.   30 capsule   11     Dispense as written.    NAME BRAND   . DOXYCYCLINE HYCLATE 100 MG PO CAPS   Oral   Take 1 capsule (100 mg total) by mouth 2 (two) times daily. One po bid x 7 days   14 capsule   0   . FUROSEMIDE 20 MG PO TABS   Oral   Take 1 tablet (20 mg total) by mouth daily.   30 tablet   11     Dispense  as written.   Marland Kitchen HYDROCHLOROTHIAZIDE 25 MG PO TABS   Oral   Take 25 mg by mouth daily.           Marland Kitchen RANITIDINE HCL 150 MG PO TABS   Oral   Take 150 mg by mouth 2 (two) times daily.         . TOPROL XL 50 MG PO TB24      Take a 1 tab in am and 1/2 of 50 mg in pm   45 tablet   11     Dispense as written.    BRAND NAME     BP 161/77  Pulse 64  Temp 98.1 F (36.7 C) (Oral)  Resp 16  Ht 5\' 8"  (1.727 m)  Wt 250 lb (113.399 kg)  BMI 38.01 kg/m2  SpO2 98%  Physical Exam CONSTITUTIONAL: Well developed/well nourished HEAD AND FACE: Normocephalic/atraumatic EYES: EOMI/PERRL ENMT: Mucous membranes moist NECK: supple no meningeal signs SPINE:entire spine nontender CV: S1/S2 noted, no murmurs/rubs/gallops noted LUNGS: Lungs are clear to auscultation bilaterally, no apparent distress ABDOMEN: soft, nontender, no rebound or guarding GU:no cva tenderness NEURO: Pt is awake/alert, moves all extremitiesx4 EXTREMITIES: pulses  normal, full ROM No edema noted.  No calf tenderness or palpable cord She has small area of erythema just proximal to left lateral malleolus with mild tenderness.  No crepitance/abscess.  She has full ROM of left ankle.  The erythema does not track distally or proximally.   SKIN: warm, color normal PSYCH: no abnormalities of mood noted  ED Course  Procedures   1. Cellulitis       MDM  Nursing notes including past medical history and social history reviewed and considered in documentation   Pt reports area of erythema is improving.  Could have small patch of cellulitis and she has tolerated doxy previously Pain is not out of proportion to exam.  No signs of DVT.  Doubt septic joint Advised that if symptoms not resolved with doxy needs close followup         Joya Gaskins, MD 11/15/12 1247

## 2012-11-15 NOTE — ED Notes (Signed)
Pt concerned about area on outer aspect of left ankle.  Some redness and swelling noted at area.  Pt states it is painful and has not seen any open areas or drainage.  No known fever.

## 2012-11-29 ENCOUNTER — Encounter (HOSPITAL_BASED_OUTPATIENT_CLINIC_OR_DEPARTMENT_OTHER): Payer: Self-pay | Admitting: Emergency Medicine

## 2012-11-29 ENCOUNTER — Emergency Department (HOSPITAL_BASED_OUTPATIENT_CLINIC_OR_DEPARTMENT_OTHER)
Admission: EM | Admit: 2012-11-29 | Discharge: 2012-11-29 | Disposition: A | Discharge status: Home / Self Care | Payer: Medicare Other | Attending: Emergency Medicine | Admitting: Emergency Medicine

## 2012-11-29 DIAGNOSIS — Z7982 Long term (current) use of aspirin: Secondary | ICD-10-CM | POA: Insufficient documentation

## 2012-11-29 DIAGNOSIS — Z8719 Personal history of other diseases of the digestive system: Secondary | ICD-10-CM | POA: Insufficient documentation

## 2012-11-29 DIAGNOSIS — Y929 Unspecified place or not applicable: Secondary | ICD-10-CM | POA: Insufficient documentation

## 2012-11-29 DIAGNOSIS — L089 Local infection of the skin and subcutaneous tissue, unspecified: Secondary | ICD-10-CM | POA: Insufficient documentation

## 2012-11-29 DIAGNOSIS — Z79899 Other long term (current) drug therapy: Secondary | ICD-10-CM | POA: Insufficient documentation

## 2012-11-29 DIAGNOSIS — Z87891 Personal history of nicotine dependence: Secondary | ICD-10-CM | POA: Insufficient documentation

## 2012-11-29 DIAGNOSIS — K219 Gastro-esophageal reflux disease without esophagitis: Secondary | ICD-10-CM | POA: Insufficient documentation

## 2012-11-29 DIAGNOSIS — I10 Essential (primary) hypertension: Secondary | ICD-10-CM | POA: Insufficient documentation

## 2012-11-29 DIAGNOSIS — E785 Hyperlipidemia, unspecified: Secondary | ICD-10-CM | POA: Insufficient documentation

## 2012-11-29 DIAGNOSIS — Z8669 Personal history of other diseases of the nervous system and sense organs: Secondary | ICD-10-CM | POA: Insufficient documentation

## 2012-11-29 DIAGNOSIS — X58XXXA Exposure to other specified factors, initial encounter: Secondary | ICD-10-CM | POA: Insufficient documentation

## 2012-11-29 DIAGNOSIS — Y939 Activity, unspecified: Secondary | ICD-10-CM | POA: Insufficient documentation

## 2012-11-29 DIAGNOSIS — G4730 Sleep apnea, unspecified: Secondary | ICD-10-CM | POA: Insufficient documentation

## 2012-11-29 MED ORDER — DOXYCYCLINE HYCLATE 100 MG PO CAPS: 100.0000 mg | ORAL_CAPSULE | Freq: Two times a day (BID) | ORAL | Status: DC

## 2012-11-29 NOTE — ED Notes (Signed)
Pt with wound to right inguinal fold for over one year treated in past by Doxycycline. Pt recently treated for left ankle cellulitis which has improved. Today found that right groin wound was worse and bleeding.

## 2012-11-29 NOTE — ED Provider Notes (Signed)
History     CSN: [id number]  Arrival date & time 11/29/12  1001   First MD Initiated Contact with Patient 11/29/12 1024      Chief Complaint  Patient presents with  . Wound Infection    (Consider location/radiation/quality/duration/timing/severity/associated sxs/prior treatment) HPI Pt reports she has had a poorly healing wound on her R groin for the last several months, typically improves with doxycycline but then returns. She was on a week of doxy for a ankle cellulitis a few weeks ago and her groin wound seem to be doing better, but she noticed some increased redness and swelling over the last several days and wound opened today while in the shower. Some blood came out then, but no drainage since. No fever, minimal pain.   Past Medical History  Diagnosis Date  . Hepatitis   . Cataract   . Hypertension   . GERD (gastroesophageal reflux disease)   . Hyperlipidemia   . Sleep apnea     CPAP  . Hernia, hiatal   . Heart palpitations     Past Surgical History  Procedure Date  . Appendectomy 2004  . Cholecystectomy   . Retinal detachment surgery   . Cataract extraction w/ intraocular lens implant     both eyes    Family History  Problem Relation Age of Onset  . Cancer Father 38  . Hypertension Mother   . CVA Mother     History  Substance Use Topics  . Smoking status: Former Games developer  . Smokeless tobacco: Not on file     Comment: QUIT IN 1990  . Alcohol Use:     OB History    Grav Para Term Preterm Abortions TAB SAB Ect Mult Living                  Review of Systems All other systems reviewed and are negative except as noted in HPI.   Allergies  Codeine; Kenalog; and Penicillins  Home Medications   Current Outpatient Rx  Name  Route  Sig  Dispense  Refill  . ASPIRIN PO   Oral   Take 81 mg by mouth.          Marland Kitchen DILTIAZEM HCL ER COATED BEADS 240 MG PO CP24   Oral   Take 1 capsule (240 mg total) by mouth daily.   30 capsule   11     Dispense as  written.    NAME BRAND   . DOXYCYCLINE HYCLATE 100 MG PO CAPS   Oral   Take 1 capsule (100 mg total) by mouth 2 (two) times daily. One po bid x 7 days   14 capsule   0   . FUROSEMIDE 20 MG PO TABS   Oral   Take 1 tablet (20 mg total) by mouth daily.   30 tablet   11     Dispense as written.   Marland Kitchen HYDROCHLOROTHIAZIDE 25 MG PO TABS   Oral   Take 25 mg by mouth daily.           Marland Kitchen RANITIDINE HCL 150 MG PO TABS   Oral   Take 150 mg by mouth 2 (two) times daily.         . TOPROL XL 50 MG PO TB24      Take a 1 tab in am and 1/2 of 50 mg in pm   45 tablet   11     Dispense as written.    BRAND NAME  BP 175/78  Pulse 61  Temp 98.6 F (37 C) (Oral)  Resp 18  Ht 5\' 8"  (1.727 m)  Wt 250 lb (113.399 kg)  BMI 38.01 kg/m2  SpO2 98%  Physical Exam  Constitutional: She is oriented to person, place, and time. She appears well-developed and well-nourished.  HENT:  Head: Normocephalic and atraumatic.  Neck: Neck supple.  Pulmonary/Chest: Effort normal.  Neurological: She is alert and oriented to person, place, and time. No cranial nerve deficit.  Skin: Skin is warm and dry.       There is an area of erythema and induration to R inguinal area, no lymphadenopathy, no fluctuance, no drainage  Psychiatric: She has a normal mood and affect. Her behavior is normal.    ED Course  Procedures (including critical care time)  Labs Reviewed - No data to display No results found.   No diagnosis found.    MDM  Will give a longer course of doxycycline, advised local wound care, return for worsening. Pt would like referral to Dr. Constance Goltz to establish as new PCP.         Charles B. Bernette Mayers, MD 11/29/12 1047

## 2012-12-17 ENCOUNTER — Encounter: Payer: Self-pay | Admitting: Internal Medicine

## 2012-12-17 ENCOUNTER — Ambulatory Visit (INDEPENDENT_AMBULATORY_CARE_PROVIDER_SITE_OTHER): Payer: Medicare Other | Admitting: Internal Medicine

## 2012-12-17 VITALS — BP 144/84 | HR 60 | Temp 97.4°F | Resp 18 | Wt 277.0 lb

## 2012-12-17 DIAGNOSIS — K219 Gastro-esophageal reflux disease without esophagitis: Secondary | ICD-10-CM

## 2012-12-17 DIAGNOSIS — Z139 Encounter for screening, unspecified: Secondary | ICD-10-CM

## 2012-12-17 DIAGNOSIS — G629 Polyneuropathy, unspecified: Secondary | ICD-10-CM

## 2012-12-17 DIAGNOSIS — M1990 Unspecified osteoarthritis, unspecified site: Secondary | ICD-10-CM

## 2012-12-17 DIAGNOSIS — E569 Vitamin deficiency, unspecified: Secondary | ICD-10-CM

## 2012-12-17 DIAGNOSIS — G4730 Sleep apnea, unspecified: Secondary | ICD-10-CM

## 2012-12-17 DIAGNOSIS — I421 Obstructive hypertrophic cardiomyopathy: Secondary | ICD-10-CM

## 2012-12-17 DIAGNOSIS — R630 Anorexia: Secondary | ICD-10-CM

## 2012-12-17 DIAGNOSIS — G609 Hereditary and idiopathic neuropathy, unspecified: Secondary | ICD-10-CM

## 2012-12-17 DIAGNOSIS — I10 Essential (primary) hypertension: Secondary | ICD-10-CM

## 2012-12-17 DIAGNOSIS — R002 Palpitations: Secondary | ICD-10-CM

## 2012-12-17 DIAGNOSIS — D649 Anemia, unspecified: Secondary | ICD-10-CM

## 2012-12-17 NOTE — Patient Instructions (Addendum)
Schedule CPE  mammgram today

## 2012-12-17 NOTE — Progress Notes (Signed)
Subjective:    Patient ID: Victoria Delgado, female    DOB: 07-01-1941, 72 y.o.   MRN: [id number]  HPI New pt here for first visit to establish primary care.Marland Kitchen  PMH of HOCM, DJD, HTN, GERD, cardiac palpitations,  OSA on Cpap.    She reports that she has had chronic groin infections and has had multiple antibiotics .  Most recent course of doxycycline (2 weeks) that she finished 5 days ago.  Area appears to be healed now per her report.  Secondly she notes she has had chronic numbness over years.  Numbness started in toes and now involves the entire foot bilaterally.   She has never had a work up for this but would like to talk to a neurologist  She is intolerant to statins due to muscle discomfort.    See BP  Pt reports at home her SBP runs around 130     Allergies  Allergen Reactions  . Codeine   . Kenalog (Triamcinolone Acetonide)   . Penicillins    Past Medical History  Diagnosis Date  . Hepatitis   . Cataract   . Hypertension   . GERD (gastroesophageal reflux disease)   . Hyperlipidemia   . Sleep apnea     CPAP  . Hernia, hiatal   . Heart palpitations    Past Surgical History  Procedure Date  . Appendectomy 2004  . Cholecystectomy   . Retinal detachment surgery   . Cataract extraction w/ intraocular lens implant     both eyes   History   Social History  . Marital Status: Married    Spouse Name: N/A    Number of Children: 2  . Years of Education: N/A   Occupational History  .  Other   Social History Main Topics  . Smoking status: Former Games developer  . Smokeless tobacco: Not on file     Comment: QUIT IN 1990  . Alcohol Use:   . Drug Use:   . Sexually Active:    Other Topics Concern  . Not on file   Social History Narrative   Two grandchildren.   Family History  Problem Relation Age of Onset  . Cancer Father 41  . Hypertension Mother   . CVA Mother    Patient Active Problem List  Diagnosis  . HOCM (hypertrophic obstructive cardiomyopathy)  . HTN  (hypertension)  . Sleep apnea  . Heart palpitations   Current Outpatient Prescriptions on File Prior to Visit  Medication Sig Dispense Refill  . ASPIRIN PO Take 81 mg by mouth.       . diltiazem (CARDIZEM CD) 240 MG 24 hr capsule Take 1 capsule (240 mg total) by mouth daily.  30 capsule  11  . doxycycline (VIBRAMYCIN) 100 MG capsule Take 1 capsule (100 mg total) by mouth 2 (two) times daily.  28 capsule  0  . furosemide (LASIX) 20 MG tablet Take 1 tablet (20 mg total) by mouth daily.  30 tablet  11  . hydrochlorothiazide 25 MG tablet Take 25 mg by mouth daily.        . ranitidine (ZANTAC) 150 MG tablet Take 150 mg by mouth 2 (two) times daily.      . TOPROL XL 50 MG 24 hr tablet Take a 1 tab in am and 1/2 of 50 mg in pm  45 tablet  11       Review of Systems See HPI    Objective:   Physical Exam Physical Exam  Nursing note and vitals reviewed.  Constitutional: She is oriented to person, place, and time. She appears well-developed and well-nourished.  HENT:  Head: Normocephalic and atraumatic.  Cardiovascular: Normal rate and regular rhythm. Exam reveals no gallop and no friction rub.  No murmur heard.  Pulmonary/Chest: Breath sounds normal. She has no wheezes. She has no rales.  Groin:  Area of concern on R side of groin appears to be healed  NO redness or edema.  Neurological: She is alert and oriented to person, place, and time.  Skin: Skin is warm and dry.  Psychiatric: She has a normal mood and affect. Her behavior is normal.                Assessment & Plan:  Probable hidranitis suppurativa R side of groin:  Area healed now. Pt to call if recurs.  Peripheral neuropathy  Pt wishes to speak to neurologist will refer to Dr. Vickey Huger    HTN  Repeat BP improved will monter over time.    DJD    Palpitaitons  , HOCM  Managed by Dr. Antoine Poche  See me as needed Schedule CPE,  Will order Mm today

## 2012-12-29 ENCOUNTER — Inpatient Hospital Stay (HOSPITAL_BASED_OUTPATIENT_CLINIC_OR_DEPARTMENT_OTHER): Admission: RE | Admit: 2012-12-29 | Payer: Medicare Other | Source: Ambulatory Visit

## 2012-12-29 ENCOUNTER — Ambulatory Visit (HOSPITAL_BASED_OUTPATIENT_CLINIC_OR_DEPARTMENT_OTHER)
Admission: RE | Admit: 2012-12-29 | Discharge: 2012-12-29 | Disposition: A | Discharge status: Home / Self Care | Payer: Medicare Other | Source: Ambulatory Visit | Attending: Internal Medicine | Admitting: Internal Medicine

## 2012-12-29 DIAGNOSIS — Z1231 Encounter for screening mammogram for malignant neoplasm of breast: Secondary | ICD-10-CM | POA: Insufficient documentation

## 2012-12-30 ENCOUNTER — Ambulatory Visit (INDEPENDENT_AMBULATORY_CARE_PROVIDER_SITE_OTHER): Payer: Medicare Other | Admitting: Internal Medicine

## 2012-12-30 ENCOUNTER — Encounter: Payer: Self-pay | Admitting: Internal Medicine

## 2012-12-30 VITALS — BP 136/88 | HR 58 | Temp 97.6°F | Resp 16 | Ht 68.0 in | Wt 274.0 lb

## 2012-12-30 DIAGNOSIS — L02219 Cutaneous abscess of trunk, unspecified: Secondary | ICD-10-CM

## 2012-12-30 DIAGNOSIS — I10 Essential (primary) hypertension: Secondary | ICD-10-CM

## 2012-12-30 DIAGNOSIS — L03314 Cellulitis of groin: Secondary | ICD-10-CM | POA: Insufficient documentation

## 2012-12-30 MED ORDER — SULFAMETHOXAZOLE-TRIMETHOPRIM 800-160 MG PO TABS: 1.0000 | ORAL_TABLET | Freq: Two times a day (BID) | ORAL | Status: DC

## 2012-12-30 NOTE — Patient Instructions (Addendum)
Get HIbiclens solution  Leave on skin for 10 minutes then wash off.  Do this twice a day

## 2012-12-30 NOTE — Progress Notes (Signed)
Subjective:    Patient ID: Victoria Delgado, female    DOB: 10-30-1941, 72 y.o.   MRN: [id number]  HPI Victoria Delgado is here for acute visit.   She states  That her R groin area had opened slightly two days ago and drained fluid but now drainage has stopped and it appears to be healing.  No draianage now no fever  Allergies  Allergen Reactions  . Codeine   . Kenalog (Triamcinolone Acetonide)   . Penicillins    Past Medical History  Diagnosis Date  . Hepatitis   . Cataract   . Hypertension   . GERD (gastroesophageal reflux disease)   . Hyperlipidemia   . Sleep apnea     CPAP  . Hernia, hiatal   . Heart palpitations    Past Surgical History  Procedure Date  . Appendectomy 2004  . Cholecystectomy   . Retinal detachment surgery   . Cataract extraction w/ intraocular lens implant     both eyes   History   Social History  . Marital Status: Married    Spouse Name: N/A    Number of Children: 2  . Years of Education: N/A   Occupational History  .  Other   Social History Main Topics  . Smoking status: Former Games developer  . Smokeless tobacco: Not on file     Comment: QUIT IN 1990  . Alcohol Use:   . Drug Use:   . Sexually Active:    Other Topics Concern  . Not on file   Social History Narrative   Two grandchildren.   Family History  Problem Relation Age of Onset  . Cancer Father 62  . Hypertension Mother   . CVA Mother    Patient Active Problem List  Diagnosis  . HOCM (hypertrophic obstructive cardiomyopathy)  . HTN (hypertension)  . Sleep apnea  . Heart palpitations  . DJD (degenerative joint disease)  . GERD (gastroesophageal reflux disease)  . Peripheral neuropathy   Current Outpatient Prescriptions on File Prior to Visit  Medication Sig Dispense Refill  . ASPIRIN PO Take 81 mg by mouth.       . diltiazem (CARDIZEM CD) 240 MG 24 hr capsule Take 1 capsule (240 mg total) by mouth daily.  30 capsule  11  . fish oil-omega-3 fatty acids 1000 MG capsule Take 2 g by  mouth daily.      . furosemide (LASIX) 20 MG tablet Take 1 tablet (20 mg total) by mouth daily.  30 tablet  11  . Garlic 2 MG CAPS Take 1 capsule by mouth 2 (two) times daily.      . hydrochlorothiazide 25 MG tablet Take 25 mg by mouth daily.        . Multiple Vitamins-Minerals (MULTIVITAMIN WITH MINERALS) tablet Take 1 tablet by mouth daily.      . ranitidine (ZANTAC) 150 MG tablet Take 150 mg by mouth 2 (two) times daily.      . TOPROL XL 50 MG 24 hr tablet Take a 1 tab in am and 1/2 of 50 mg in pm  45 tablet  11       Review of Systems    see HPI Objective:   Physical Exam  Physical Exam  Nursing note and vitals reviewed.  Constitutional: She is oriented to person, place, and time. She appears well-developed and well-nourished.  HENT:  Head: Normocephalic and atraumatic.  Cardiovascular: Normal rate and regular rhythm. Exam reveals no gallop and no friction rub.  No  murmur heard.  Pulmonary/Chest: Breath sounds normal. She has no wheezes. She has no rales.  R groin  Area of small cellulitis in groin no drainage now Neurological: She is alert and oriented to person, place, and time.  Skin: Skin is warm and dry.  Psychiatric: She has a normal mood and affect. Her behavior is normal.            Assessment & Plan:  R groin cellulitis   Bactrim DS bid 10 days.  This may represent hidranitis suppurativa.  If recurs she is to return to office  She may need surgical referral or imaging.  OK to use Hibiclens bid to area  HTN  Repeat BP improved.

## 2013-02-02 ENCOUNTER — Ambulatory Visit (HOSPITAL_BASED_OUTPATIENT_CLINIC_OR_DEPARTMENT_OTHER): Payer: Medicare Other

## 2013-02-04 ENCOUNTER — Encounter: Payer: Self-pay | Admitting: *Deleted

## 2013-02-08 ENCOUNTER — Encounter: Payer: Self-pay | Admitting: Internal Medicine

## 2013-02-08 DIAGNOSIS — E782 Mixed hyperlipidemia: Secondary | ICD-10-CM | POA: Insufficient documentation

## 2013-02-08 DIAGNOSIS — K760 Fatty (change of) liver, not elsewhere classified: Secondary | ICD-10-CM | POA: Insufficient documentation

## 2013-03-05 ENCOUNTER — Telehealth: Payer: Self-pay | Admitting: Internal Medicine

## 2013-03-05 ENCOUNTER — Ambulatory Visit (INDEPENDENT_AMBULATORY_CARE_PROVIDER_SITE_OTHER): Payer: Medicare Other | Admitting: Internal Medicine

## 2013-03-05 ENCOUNTER — Encounter: Payer: Self-pay | Admitting: Internal Medicine

## 2013-03-05 VITALS — BP 130/70 | HR 58 | Temp 97.0°F | Resp 20 | Ht 68.0 in | Wt 282.0 lb

## 2013-03-05 DIAGNOSIS — M609 Myositis, unspecified: Secondary | ICD-10-CM

## 2013-03-05 DIAGNOSIS — Z130 Encounter for screening for diseases of the blood and blood-forming organs and certain disorders involving the immune mechanism: Secondary | ICD-10-CM

## 2013-03-05 DIAGNOSIS — E785 Hyperlipidemia, unspecified: Secondary | ICD-10-CM

## 2013-03-05 DIAGNOSIS — Z780 Asymptomatic menopausal state: Secondary | ICD-10-CM

## 2013-03-05 DIAGNOSIS — I10 Essential (primary) hypertension: Secondary | ICD-10-CM

## 2013-03-05 DIAGNOSIS — M1990 Unspecified osteoarthritis, unspecified site: Secondary | ICD-10-CM

## 2013-03-05 DIAGNOSIS — E559 Vitamin D deficiency, unspecified: Secondary | ICD-10-CM | POA: Insufficient documentation

## 2013-03-05 DIAGNOSIS — Z13228 Encounter for screening for other metabolic disorders: Secondary | ICD-10-CM

## 2013-03-05 DIAGNOSIS — G4730 Sleep apnea, unspecified: Secondary | ICD-10-CM

## 2013-03-05 DIAGNOSIS — Z13820 Encounter for screening for osteoporosis: Secondary | ICD-10-CM

## 2013-03-05 DIAGNOSIS — IMO0001 Reserved for inherently not codable concepts without codable children: Secondary | ICD-10-CM

## 2013-03-05 DIAGNOSIS — G609 Hereditary and idiopathic neuropathy, unspecified: Secondary | ICD-10-CM | POA: Insufficient documentation

## 2013-03-05 DIAGNOSIS — Z1321 Encounter for screening for nutritional disorder: Secondary | ICD-10-CM

## 2013-03-05 LAB — POCT URINALYSIS DIPSTICK
Glucose, UA: NEGATIVE
Ketones, UA: NEGATIVE
Leukocytes, UA: NEGATIVE
Protein, UA: NEGATIVE
Urobilinogen, UA: NEGATIVE

## 2013-03-05 LAB — LIPID PANEL
Cholesterol: 261 mg/dL — ABNORMAL HIGH (ref 0–200)
Total CHOL/HDL Ratio: 7.7 Ratio
Triglycerides: 252 mg/dL — ABNORMAL HIGH (ref <150)

## 2013-03-05 LAB — CBC WITH DIFFERENTIAL/PLATELET
Eosinophils Relative: 2 % (ref 0–5)
HCT: 44.3 % (ref 36.0–46.0)
Lymphocytes Relative: 25 % (ref 12–46)
Lymphs Abs: 2.2 10*3/uL (ref 0.7–4.0)
MCH: 30.1 pg (ref 26.0–34.0)
MCV: 87 fL (ref 78.0–100.0)
Monocytes Absolute: 0.9 10*3/uL (ref 0.1–1.0)
RBC: 5.09 MIL/uL (ref 3.87–5.11)
WBC: 8.6 10*3/uL (ref 4.0–10.5)

## 2013-03-05 LAB — COMPREHENSIVE METABOLIC PANEL
BUN: 17 mg/dL (ref 6–23)
CO2: 27 mEq/L (ref 19–32)
Glucose, Bld: 113 mg/dL — ABNORMAL HIGH (ref 70–99)
Sodium: 142 mEq/L (ref 135–145)
Total Bilirubin: 0.4 mg/dL (ref 0.3–1.2)
Total Protein: 6.5 g/dL (ref 6.0–8.3)

## 2013-03-05 MED ORDER — DOXYCYCLINE HYCLATE 100 MG PO TABS: 100.0000 mg | ORAL_TABLET | Freq: Two times a day (BID) | ORAL | Status: DC

## 2013-03-05 NOTE — Telephone Encounter (Signed)
pt's appointment on 04.09.14 at 930 am... pt made aware of appointment via [phone] at 1120 am 04.03.14.. with date, time, location, and number... ad

## 2013-03-05 NOTE — Progress Notes (Signed)
Subjective:    Patient ID: Victoria Delgado, female    DOB: [birth year], 72 y.o.   MRN: [id number]  HPI Victoria Delgado is here for CPE  Overall doing well    She is afraid to take vaccines or medications  R groin area healed over but sometimes sees  Blood no pus  Shehas never had a Dexa  Hyperlipidemia  She is intolerant of statins  Allergies  Allergen Reactions  . Codeine   . Kenalog (Triamcinolone Acetonide)   . Penicillins   . Statins     Possible myositis    Past Medical History  Diagnosis Date  . Hepatitis   . Cataract   . Hypertension   . GERD (gastroesophageal reflux disease)   . Hyperlipidemia   . Sleep apnea     CPAP  . Hernia, hiatal   . Heart palpitations    Past Surgical History  Procedure Laterality Date  . Appendectomy  2004  . Cholecystectomy    . Retinal detachment surgery    . Cataract extraction w/ intraocular lens implant      both eyes   History   Social History  . Marital Status: Married    Spouse Name: N/A    Number of Children: 2  . Years of Education: N/A   Occupational History  .  Other   Social History Main Topics  . Smoking status: Former Games developer  . Smokeless tobacco: Not on file     Comment: QUIT IN 1990  . Alcohol Use: No  . Drug Use: Not on file  . Sexually Active: Yes   Other Topics Concern  . Not on file   Social History Narrative   Two grandchildren.            Family History  Problem Relation Age of Onset  . Cancer Father 34  . Hypertension Mother   . CVA Mother    Patient Active Problem List  Diagnosis  . HOCM (hypertrophic obstructive cardiomyopathy)  . HTN (hypertension)  . Sleep apnea  . Heart palpitations  . DJD (degenerative joint disease)  . GERD (gastroesophageal reflux disease)  . Peripheral neuropathy  . Cellulitis of groin, right  . Fatty infiltration of liver  . Other and unspecified hyperlipidemia  . Morbid obesity  . Statin-induced myositis  . Unspecified hereditary and idiopathic peripheral  neuropathy   Current Outpatient Prescriptions on File Prior to Visit  Medication Sig Dispense Refill  . ASPIRIN PO Take 81 mg by mouth.       . diltiazem (CARDIZEM CD) 240 MG 24 hr capsule Take 1 capsule (240 mg total) by mouth daily.  30 capsule  11  . furosemide (LASIX) 20 MG tablet Take 1 tablet (20 mg total) by mouth daily.  30 tablet  11  . hydrochlorothiazide 25 MG tablet Take 25 mg by mouth daily.        . ranitidine (ZANTAC) 150 MG tablet Take 150 mg by mouth 2 (two) times daily.      . TOPROL XL 50 MG 24 hr tablet Take a 1 tab in am and 1/2 of 50 mg in pm  45 tablet  11   No current facility-administered medications on file prior to visit.       Review of Systems    see HPI Objective:   Physical Exam Physical Exam  Nursing note and vitals reviewed.  Constitutional: She is oriented to person, place, and time. She appears well-developed and well-nourished.  HENT:  Head: Normocephalic  and atraumatic.  Right Ear: Tympanic membrane and ear canal normal. No drainage. Tympanic membrane is not injected and not erythematous.  Left Ear: Tympanic membrane and ear canal normal. No drainage. Tympanic membrane is not injected and not erythematous.  Nose: Nose normal. Right sinus exhibits no maxillary sinus tenderness and no frontal sinus tenderness. Left sinus exhibits no maxillary sinus tenderness and no frontal sinus tenderness.  Mouth/Throat: Oropharynx is clear and moist. No oral lesions. No oropharyngeal exudate.  Eyes: Conjunctivae and EOM are normal. Pupils are equal, round, and reactive to light.  Neck: Normal range of motion. Neck supple. No JVD present. Carotid bruit is not present. No mass and no thyromegaly present.  Cardiovascular: Normal rate, regular rhythm, S1 normal, S2 normal and intact distal pulses. Exam reveals no gallop and no friction rub.  No murmur heard.  Pulses:  Carotid pulses are 2+ on the right side, and 2+ on the left side.  Dorsalis pedis pulses are 2+  on the right side, and 2+ on the left side.  No carotid bruit. No LE edema  Pulmonary/Chest: Breath sounds normal. She has no wheezes. She has no rales. She exhibits no tenderness.  Abdominal: Soft. Bowel sounds are normal. She exhibits no distension and no mass. There is no hepatosplenomegaly. There is no tenderness. There is no CVA tenderness.  Musculoskeletal: Normal range of motion.  No active synovitis to joints.  Lymphadenopathy:  She has no cervical adenopathy.  She has no axillary adenopathy.  Right: No inguinal and no supraclavicular adenopathy present.  Left: No inguinal and no supraclavicular adenopathy present.  Neurological: She is alert and oriented to person, place, and time. She has normal strength and normal reflexes. She displays no tremor. No cranial nerve deficit or sensory deficit. Coordination and gait normal.  Skin: Skin is warm and dry. No rash noted. No cyanosis. Nails show no clubbing.   R groin in area of prior cath site minimal amt of blooddy drainage Psychiatric: She has a normal mood and affect. Her speech is normal and behavior is normal. Cognition and memory are normal.          Assessment & Plan:  Health Maintenance  Declines vaccines  Will schedule Dexa    Pap per Dr. Eustace Pen GYN  HTN  Continue meds  HOCM/Palapitaitons  Followed by Dr. Antoine Poche  Nonhealing lesion groin  Will try empiric doxycycline 100 mg bid for 4 weeks. If does not heal will consider ID consult  She is using Hiibclens  Hyperlipidemia  willl check today  Intolerant to statins  History of fatty liver

## 2013-03-11 ENCOUNTER — Telehealth: Payer: Self-pay | Admitting: *Deleted

## 2013-03-11 ENCOUNTER — Ambulatory Visit
Admission: RE | Admit: 2013-03-11 | Discharge: 2013-03-11 | Disposition: A | Discharge status: Home / Self Care | Payer: Medicare Other | Source: Ambulatory Visit | Attending: Internal Medicine | Admitting: Internal Medicine

## 2013-03-11 DIAGNOSIS — Z780 Asymptomatic menopausal state: Secondary | ICD-10-CM

## 2013-03-11 NOTE — Telephone Encounter (Signed)
Message copied by Mathews Robinsons on Wed Mar 11, 2013  3:43 PM ------      Message from: Raechel Chute D      Created: Tue Mar 10, 2013 11:23 AM       Karen Kitchens            Call Rooney and let her know that her cholesterol is not very good.  Give her a 30 min appt with me to discuss options  (she is intolerant of statins)              Message back with appt date             Ok to mail labs to her ------

## 2013-03-16 ENCOUNTER — Encounter: Payer: Self-pay | Admitting: Internal Medicine

## 2013-03-16 ENCOUNTER — Telehealth: Payer: Self-pay | Admitting: *Deleted

## 2013-03-16 DIAGNOSIS — Z9289 Personal history of other medical treatment: Secondary | ICD-10-CM | POA: Insufficient documentation

## 2013-03-16 NOTE — Telephone Encounter (Signed)
Message copied by Mathews Robinsons on Mon Mar 16, 2013 11:45 AM ------      Message from: Raechel Chute D      Created: Mon Mar 16, 2013 11:17 AM       Call pt and let her know that her  Bone density is normal .  She is to take Calcium 1200-1500 mg daily with vitamin D3 [phone] units daily ------

## 2013-03-16 NOTE — Telephone Encounter (Signed)
Notified pt that bone density was normal and advised her per Dr Constance Goltz to take ca 11-1499 and vit D [phone] daily

## 2013-03-17 ENCOUNTER — Telehealth: Payer: Self-pay | Admitting: Internal Medicine

## 2013-03-17 ENCOUNTER — Ambulatory Visit (INDEPENDENT_AMBULATORY_CARE_PROVIDER_SITE_OTHER): Payer: Medicare Other | Admitting: Internal Medicine

## 2013-03-17 ENCOUNTER — Encounter: Payer: Self-pay | Admitting: Internal Medicine

## 2013-03-17 VITALS — BP 139/72 | HR 84 | Temp 97.5°F | Resp 20 | Wt 280.0 lb

## 2013-03-17 DIAGNOSIS — L02219 Cutaneous abscess of trunk, unspecified: Secondary | ICD-10-CM

## 2013-03-17 DIAGNOSIS — I10 Essential (primary) hypertension: Secondary | ICD-10-CM

## 2013-03-17 DIAGNOSIS — L03314 Cellulitis of groin: Secondary | ICD-10-CM

## 2013-03-17 DIAGNOSIS — E785 Hyperlipidemia, unspecified: Secondary | ICD-10-CM

## 2013-03-17 NOTE — Patient Instructions (Addendum)
  Will refer to lipid clinic. 

## 2013-03-17 NOTE — Telephone Encounter (Signed)
Made appt with Dr Malachy Mood July 17, 2013 at 10:00 am.  Pt made aware of appt by cell Doctor, date, time, location, address, and phone number.Victoria Delgado

## 2013-03-17 NOTE — Progress Notes (Signed)
Subjective:    Patient ID: Victoria Delgado, female    DOB: 1941/09/18, 72 y.o.   MRN: [id number]  HPI Victoria Delgado is here for follow up of hyperlipidemia.  She reports intolerances to multiple statins including Pravachol, Lipitor and Crestor.  She had severe muscle aches in ant thighs and she feels her foot neuropathy may be statin related  She also has tried Zetia and Niaspan    Strtong FH of hyperlipidemia  Her groin cellulitis is healed per her report.    See BP  She is a non smoker  Allergies  Allergen Reactions  . Codeine   . Kenalog (Triamcinolone Acetonide)   . Penicillins   . Statins     Possible myositis    Past Medical History  Diagnosis Date  . Hepatitis   . Cataract   . Hypertension   . GERD (gastroesophageal reflux disease)   . Hyperlipidemia   . Sleep apnea     CPAP  . Hernia, hiatal   . Heart palpitations    Past Surgical History  Procedure Laterality Date  . Appendectomy  2004  . Cholecystectomy    . Retinal detachment surgery    . Cataract extraction w/ intraocular lens implant      both eyes   History   Social History  . Marital Status: Married    Spouse Name: N/A    Number of Children: 2  . Years of Education: N/A   Occupational History  .  Other   Social History Main Topics  . Smoking status: Former Games developer  . Smokeless tobacco: Not on file     Comment: QUIT IN 1990  . Alcohol Use: No  . Drug Use: Not on file  . Sexually Active: Yes   Other Topics Concern  . Not on file   Social History Narrative   Two grandchildren.            Family History  Problem Relation Age of Onset  . Cancer Father 3  . Hypertension Mother   . CVA Mother    Patient Active Problem List  Diagnosis  . HOCM (hypertrophic obstructive cardiomyopathy)  . HTN (hypertension)  . Sleep apnea  . Heart palpitations  . DJD (degenerative joint disease)  . GERD (gastroesophageal reflux disease)  . Peripheral neuropathy  . Cellulitis of groin, right  . Fatty  infiltration of liver  . Other and unspecified hyperlipidemia  . Morbid obesity  . Statin-induced myositis  . Unspecified hereditary and idiopathic peripheral neuropathy  . Unspecified vitamin D deficiency  . H/O bone density study   Current Outpatient Prescriptions on File Prior to Visit  Medication Sig Dispense Refill  . ASPIRIN PO Take 81 mg by mouth.       . diltiazem (CARDIZEM CD) 240 MG 24 hr capsule Take 1 capsule (240 mg total) by mouth daily.  30 capsule  11  . doxycycline (VIBRA-TABS) 100 MG tablet Take 1 tablet (100 mg total) by mouth 2 (two) times daily.  60 tablet  0  . hydrochlorothiazide 25 MG tablet Take 25 mg by mouth daily.        . ranitidine (ZANTAC) 150 MG tablet Take 150 mg by mouth 2 (two) times daily.      . TOPROL XL 50 MG 24 hr tablet Take a 1 tab in am and 1/2 of 50 mg in pm  45 tablet  11  . furosemide (LASIX) 20 MG tablet Take 1 tablet (20 mg total) by mouth daily.  30 tablet  11   No current facility-administered medications on file prior to visit.      Review of Systems See HPI    Objective:   Physical Exam Physical Exam  Nursing note and vitals reviewed.  Constitutional: She is oriented to person, place, and time. She appears well-developed and well-nourished.  HENT:  Head: Normocephalic and atraumatic.  Cardiovascular: Normal rate and regular rhythm. Exam reveals no gallop and no friction rub.  No murmur heard.  Pulmonary/Chest: Breath sounds normal. She has no wheezes. She has no rales.  Neurological: She is alert and oriented to person, place, and time.  Skin: Skin is warm and dry.  Psychiatric: She has a normal mood and affect. Her behavior is normal.              Assessment & Plan:  FAmilial hyperlipidemia  Will refer to lipid clinic at [facility]  Groin cellulitis  Improving  Htn  Continue current meds

## 2013-04-20 ENCOUNTER — Ambulatory Visit (HOSPITAL_COMMUNITY): Payer: Medicare Other | Attending: Cardiology

## 2013-04-20 DIAGNOSIS — R002 Palpitations: Secondary | ICD-10-CM | POA: Insufficient documentation

## 2013-04-20 DIAGNOSIS — Z87891 Personal history of nicotine dependence: Secondary | ICD-10-CM | POA: Insufficient documentation

## 2013-04-20 DIAGNOSIS — I079 Rheumatic tricuspid valve disease, unspecified: Secondary | ICD-10-CM | POA: Insufficient documentation

## 2013-04-20 DIAGNOSIS — I10 Essential (primary) hypertension: Secondary | ICD-10-CM | POA: Insufficient documentation

## 2013-04-20 DIAGNOSIS — I421 Obstructive hypertrophic cardiomyopathy: Secondary | ICD-10-CM | POA: Insufficient documentation

## 2013-04-20 DIAGNOSIS — I4891 Unspecified atrial fibrillation: Secondary | ICD-10-CM | POA: Insufficient documentation

## 2013-04-20 DIAGNOSIS — G4730 Sleep apnea, unspecified: Secondary | ICD-10-CM | POA: Insufficient documentation

## 2013-04-20 DIAGNOSIS — I359 Nonrheumatic aortic valve disorder, unspecified: Secondary | ICD-10-CM | POA: Insufficient documentation

## 2013-04-20 NOTE — Progress Notes (Signed)
Echocardiogram performed.  

## 2013-04-23 ENCOUNTER — Ambulatory Visit: Payer: Medicare Other | Admitting: Cardiology

## 2013-04-28 ENCOUNTER — Encounter: Payer: Self-pay | Admitting: Cardiology

## 2013-04-28 ENCOUNTER — Ambulatory Visit (INDEPENDENT_AMBULATORY_CARE_PROVIDER_SITE_OTHER): Payer: Medicare Other | Admitting: Cardiology

## 2013-04-28 VITALS — BP 140/70 | HR 57 | Ht 68.0 in | Wt 280.4 lb

## 2013-04-28 DIAGNOSIS — I421 Obstructive hypertrophic cardiomyopathy: Secondary | ICD-10-CM

## 2013-04-28 DIAGNOSIS — I10 Essential (primary) hypertension: Secondary | ICD-10-CM

## 2013-04-28 DIAGNOSIS — E785 Hyperlipidemia, unspecified: Secondary | ICD-10-CM

## 2013-04-28 DIAGNOSIS — R002 Palpitations: Secondary | ICD-10-CM

## 2013-04-28 NOTE — Progress Notes (Signed)
HPI The patient presents as a new patient for me. She has seen by another group. She was found in the past to have atrial fibrillation which she ultimately thinks is related to an arthritis shot. This was in 2008. At that time she was found to have asymmetric septal hypertrophy. This has been followed conservatively. Her last echocardiogram in July of this year demonstrated a peak gradient of 35 with the Valsalva maneuver. The EF was greater than 70%. She has had a history of long-standing isolated palpitations. She apparently had some supraventricular tachycardia in the past but she was sick with gallstones at that time. She otherwise says she rarely feels any palpitations. She does not have any chest pressure, neck or arm discomfort. She denies any shortness of breath, PND or orthopnea. She's had a slow weight gain over the years. She does have some mild lower extremity edema.  Allergies  Allergen Reactions  . Codeine   . Kenalog (Triamcinolone Acetonide)   . Penicillins   . Statins     Possible myositis     Current Outpatient Prescriptions  Medication Sig Dispense Refill  . ASPIRIN PO Take 81 mg by mouth.       . diltiazem (CARDIZEM CD) 240 MG 24 hr capsule Take 1 capsule (240 mg total) by mouth daily.  30 capsule  11  . furosemide (LASIX) 20 MG tablet Take 1 tablet (20 mg total) by mouth daily.  30 tablet  11  . hydrochlorothiazide 25 MG tablet Take 25 mg by mouth daily.        . ranitidine (ZANTAC) 150 MG tablet Take 150 mg by mouth 2 (two) times daily.      . TOPROL XL 50 MG 24 hr tablet Take a 1 tab in am and 1/2 of 50 mg in pm  45 tablet  11   No current facility-administered medications for this visit.    Past Medical History  Diagnosis Date  . Hepatitis   . Cataract   . Hypertension   . GERD (gastroesophageal reflux disease)   . Hyperlipidemia   . Sleep apnea     CPAP  . Hernia, hiatal   . Heart palpitations     Past Surgical History  Procedure Laterality Date  .  Appendectomy  2004  . Cholecystectomy    . Retinal detachment surgery    . Cataract extraction w/ intraocular lens implant      both eyes    ROS:  As stated in the HPI and negative for all other systems.  PHYSICAL EXAM BP 140/70  Pulse 57  Ht 5\' 8"  (1.727 m)  Wt 280 lb 6.4 oz (127.189 kg)  BMI 42.64 kg/m2 GENERAL:  Well appearing HEENT:  Pupils equal round and reactive, fundi not visualized, oral mucosa unremarkable NECK:  No jugular venous distention, waveform within normal limits, carotid upstroke brisk and symmetric, no bruits, no thyromegaly LYMPHATICS:  No cervical, inguinal adenopathy LUNGS:  Clear to auscultation bilaterally BACK:  No CVA tenderness CHEST:  Unremarkable HEART:  PMI not displaced or sustained,S1 and S2 within normal limits, no S3, no S4, no clicks, no rubs, 2/6 systolic murmur radiating out the aortic outflow tract and into the carotids not increasing with a strain phase of Valsalva, no diastolic murmurs ABD:  Flat, positive bowel sounds normal in frequency in pitch, no bruits, no rebound, no guarding, no midline pulsatile mass, no hepatomegaly, no splenomegaly, obese EXT:  2 plus pulses throughout, trace edema, no cyanosis no clubbing  EKG:  Sinus rhythm, rate 57, axis within normal limits, intervals within normal limits, poor anterior R wave progression, no acute ST-T wave changes.  04/28/2013  ASSESSMENT AND PLAN  Hypertrophic cardiomyopathy - I reviewed her echo results and looked at the films myself today. I believe her bradycardia and his submandibular predominantly though there is a trace of valvular thickening and regurgitation. Regardless the gradient is not significant and she is not symptomatic. Therefore, we will follow this serially examination and echo is as indicated. She will continue management of her blood pressure salt and fluid restriction as therapies.  Dyslipidemia - Her LDL was greater than 170 off of meds.  She is going to follow in  the Lipid Clinic  Palpitations - She is rarely bothered by these. No change in therapy is indicated.  Overweight - We have discussed this but she is limited by hip pain.   Hypertension - Her blood pressure is slightly elevated today but she says it otherwise as well controlled less than 140 systolic. Therefore, no change in therapy or further evaluation is indicated.

## 2013-04-28 NOTE — Patient Instructions (Addendum)
The current medical regimen is effective;  continue present plan and medications.  Please call back with the name of the medication your insurance will cover.  Follow up in 1 year with Dr Antoine Poche.  You will receive a letter in the mail 2 months before you are due.  Please call us when you receive this letter to schedule your follow up appointment.

## 2013-05-01 ENCOUNTER — Ambulatory Visit: Payer: Self-pay | Admitting: Neurology

## 2013-06-03 ENCOUNTER — Telehealth: Payer: Self-pay | Admitting: Cardiology

## 2013-06-03 MED ORDER — DILTIAZEM HCL ER COATED BEADS 240 MG PO CP24: 240.0000 mg | ORAL_CAPSULE | Freq: Every day | ORAL | Status: DC

## 2013-06-03 NOTE — Telephone Encounter (Signed)
Patient wants to switch to [place] XT instead of Cardizem.  Insurance will cover [employer]  If Dr. Antoine Poche is ok with this change, she would like a 90 day supply sent to the [facility] on [address] in [place].

## 2013-06-03 NOTE — Telephone Encounter (Signed)
New problem  Pt states that her insurance would cover Cartia XT. She said that her copay is too high for the [facility] CD. She said if you could give her a back before he calls this in.

## 2013-06-04 NOTE — Telephone Encounter (Signed)
OK to switch to Cartia XT 240 mg daily (please confirm that this is the current dose) and disp 90 days with 3 refills.

## 2013-07-26 ENCOUNTER — Encounter: Payer: Self-pay | Admitting: Internal Medicine

## 2013-08-05 ENCOUNTER — Ambulatory Visit (HOSPITAL_BASED_OUTPATIENT_CLINIC_OR_DEPARTMENT_OTHER)
Admission: RE | Admit: 2013-08-05 | Discharge: 2013-08-05 | Disposition: A | Discharge status: Home / Self Care | Payer: Medicare Other | Source: Ambulatory Visit | Attending: Internal Medicine | Admitting: Internal Medicine

## 2013-08-05 ENCOUNTER — Ambulatory Visit (INDEPENDENT_AMBULATORY_CARE_PROVIDER_SITE_OTHER): Payer: Medicare Other | Admitting: Internal Medicine

## 2013-08-05 ENCOUNTER — Encounter: Payer: Self-pay | Admitting: Internal Medicine

## 2013-08-05 VITALS — BP 140/72 | HR 60 | Temp 97.0°F | Resp 18 | Wt 285.0 lb

## 2013-08-05 DIAGNOSIS — IMO0002 Reserved for concepts with insufficient information to code with codable children: Secondary | ICD-10-CM | POA: Insufficient documentation

## 2013-08-05 DIAGNOSIS — M542 Cervicalgia: Secondary | ICD-10-CM

## 2013-08-05 DIAGNOSIS — M5030 Other cervical disc degeneration, unspecified cervical region: Secondary | ICD-10-CM | POA: Insufficient documentation

## 2013-08-05 DIAGNOSIS — M549 Dorsalgia, unspecified: Secondary | ICD-10-CM

## 2013-08-05 DIAGNOSIS — M546 Pain in thoracic spine: Secondary | ICD-10-CM | POA: Insufficient documentation

## 2013-08-05 MED ORDER — NABUMETONE 500 MG PO TABS: 500.0000 mg | ORAL_TABLET | Freq: Two times a day (BID) | ORAL | Status: DC

## 2013-08-05 NOTE — Progress Notes (Signed)
Subjective:    Patient ID: Victoria Delgado, female    DOB: [birth year], 72 y.o.   MRN: [id number]  HPI  Santasia is here for acute visit.  She reports 5-6 weeks of upper thoracic back pain and neck pain.  She denies trauma but "I attribute this to carrying heavy groceries"    She denies arm paresthesias or muscle weakness in either arm  Allergies  Allergen Reactions  . Codeine   . Kenalog [Triamcinolone Acetonide]   . Penicillins   . Statins     Possible myositis    Past Medical History  Diagnosis Date  . Hepatitis   . Cataract   . Hypertension   . GERD (gastroesophageal reflux disease)   . Hyperlipidemia   . Sleep apnea     CPAP  . Hernia, hiatal   . Heart palpitations    Past Surgical History  Procedure Laterality Date  . Appendectomy  2004  . Cholecystectomy    . Retinal detachment surgery    . Cataract extraction w/ intraocular lens implant      both eyes   History   Social History  . Marital Status: Married    Spouse Name: N/A    Number of Children: 2  . Years of Education: N/A   Occupational History  .  Other   Social History Main Topics  . Smoking status: Former Games developer  . Smokeless tobacco: Not on file     Comment: QUIT IN 1990  . Alcohol Use: No  . Drug Use: Not on file  . Sexual Activity: Yes   Other Topics Concern  . Not on file   Social History Narrative   Two grandchildren.            Family History  Problem Relation Age of Onset  . Cancer Father 41  . Hypertension Mother   . CVA Mother    Patient Active Problem List   Diagnosis Date Noted  . H/O bone density study 03/16/2013  . 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 Unspecified hereditary and idiopathic peripheral neuropathy 03/05/2013  . Unspecified vitamin D deficiency 03/05/2013  . Fatty infiltration of liver 02/08/2013  . Other and unspecified hyperlipidemia 02/08/2013  . Morbid obesity 02/08/2013  . Cellulitis of groin, right 12/30/2012  . DJD (degenerative joint disease)  12/17/2012  . GERD (gastroesophageal reflux disease) 12/17/2012  . Peripheral neuropathy 12/17/2012  . HOCM (hypertrophic obstructive cardiomyopathy) 10/16/2012  . HTN (hypertension) 10/16/2012  . Sleep apnea 10/16/2012  . Heart palpitations    Current Outpatient Prescriptions on File Prior to Visit  Medication Sig Dispense Refill  . ASPIRIN PO Take 81 mg by mouth.       . diltiazem (CARDIZEM CD) 240 MG 24 hr capsule Take 1 capsule (240 mg total) by mouth daily.  90 capsule  3  . furosemide (LASIX) 20 MG tablet Take 1 tablet (20 mg total) by mouth daily.  30 tablet  11  . hydrochlorothiazide 25 MG tablet Take 25 mg by mouth daily.        . ranitidine (ZANTAC) 150 MG tablet Take 150 mg by mouth 2 (two) times daily.      . TOPROL XL 50 MG 24 hr tablet Take a 1 tab in am and 1/2 of 50 mg in pm  45 tablet  11   No current facility-administered medications on file prior to visit.      Review of Systems    see HPI Objective:   Physical Exam  Physical Exam  Nursing note and vitals reviewed.  Constitutional: She is oriented to person, place, and time. She appears well-developed and well-nourished.  HENT:  Head: Normocephalic and atraumatic.  Cardiovascular: Normal rate and regular rhythm. Exam reveals no gallop and no friction rub.  No murmur heard.  Pulmonary/Chest: Breath sounds normal. She has no wheezes. She has no rales.  Neurological: She is alert and oriented to person, place, and time.  UE  Reflexes 2+ symmetric Motor  5/5 upper extremity muscle groups Sensation intact M/S   Subjective pain with neck extension only Paraspinal muscle stiffness thoracic area Skin: Skin is warm and dry.  Psychiatric: She has a normal mood and affect. Her behavior is normal.            Assessment & Plan:  Neck pain  Will get cervical spine xrays  RElafen  500 mg bid for 10 days.  She declines muscle relaxants although I believe this may help  Thoracic back strain  See above  Will  get films today.  She does have known DJD  See me in 10-12 days

## 2013-08-05 NOTE — Patient Instructions (Addendum)
See me in 10-12 days

## 2013-08-06 ENCOUNTER — Telehealth: Payer: Self-pay | Admitting: *Deleted

## 2013-08-06 NOTE — Telephone Encounter (Signed)
Victoria Delgado called to get the results of her xray yesterday.  Please call her at [phone].

## 2013-08-07 ENCOUNTER — Telehealth: Payer: Self-pay | Admitting: *Deleted

## 2013-08-07 NOTE — Telephone Encounter (Signed)
Notified pt of X ray results  

## 2013-08-07 NOTE — Telephone Encounter (Signed)
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            Advise her to keep her follow up with me ------

## 2013-08-10 ENCOUNTER — Telehealth: Payer: Self-pay | Admitting: *Deleted

## 2013-08-10 NOTE — Telephone Encounter (Signed)
Pt reports bilat leg swelling since taking the relefen states that she experiences the same thing with NSAIDs she has stopped medication and taken an extra dose of  lasix and swelling has improved pt would like to know should she continue to double her lasix until swelling has resolved. Advised pt against this until Dr Constance Goltz has reviewed. Pt denies SOB, CP, or recent travel.  Advised pt to seek emergency care immediately if these sx occur, attempted to rescheduled pt this week pt unable to due to death in family. Advised pt to keep appt on 15th Pt voiced understanding

## 2013-08-10 NOTE — Telephone Encounter (Signed)
Victoria Delgado called she having trouble with a new medication and would like to speak with you about it.  Please call her back.

## 2013-08-11 ENCOUNTER — Telehealth: Payer: Self-pay | Admitting: *Deleted

## 2013-08-11 NOTE — Telephone Encounter (Signed)
Notified pt to take her normal dose of lasix (20mg ) and keep appt on Monday. Advised pt that if swelling continued or worsened to contact us

## 2013-08-11 NOTE — Telephone Encounter (Signed)
Victoria Delgado  She should just take normal dose of Lasix and stop the Relafen  Advise her to keep follow up appt with me

## 2013-08-17 ENCOUNTER — Ambulatory Visit (INDEPENDENT_AMBULATORY_CARE_PROVIDER_SITE_OTHER): Payer: Medicare Other | Admitting: Internal Medicine

## 2013-08-17 ENCOUNTER — Encounter: Payer: Self-pay | Admitting: Internal Medicine

## 2013-08-17 VITALS — BP 138/72 | HR 61 | Temp 98.6°F | Resp 20 | Wt 285.0 lb

## 2013-08-17 DIAGNOSIS — M1990 Unspecified osteoarthritis, unspecified site: Secondary | ICD-10-CM

## 2013-08-17 DIAGNOSIS — I421 Obstructive hypertrophic cardiomyopathy: Secondary | ICD-10-CM

## 2013-08-17 DIAGNOSIS — M542 Cervicalgia: Secondary | ICD-10-CM

## 2013-08-17 DIAGNOSIS — R609 Edema, unspecified: Secondary | ICD-10-CM

## 2013-08-17 MED ORDER — TRAMADOL HCL 50 MG PO TABS: 50.0000 mg | ORAL_TABLET | Freq: Four times a day (QID) | ORAL | Status: DC | PRN

## 2013-08-17 NOTE — Patient Instructions (Signed)
See me in two weeks  Take lasix 40 mg daily for the next 6 days

## 2013-08-17 NOTE — Progress Notes (Signed)
Subjective:    Patient ID: Victoria Delgado, female    DOB: 1941/01/10, 72 y.o.   MRN: [id number]  HPI  Siyah is here for follow up  She tells me the Relafen made her legs swell significantly so she stopped and took one day of an extra Lasix.    She reports she has always had edema with NSAIDS in the past.   She is also intolerant of steroids as it made her glucose really elevate in the past when she has a steroid injection in her hip   See imaging  DJD and thoracic bone spurs.  She still has lots of pain in her cervical area.  No muscle weakness no paresthesisas  Allergies  Allergen Reactions  . Codeine   . Kenalog [Triamcinolone Acetonide]   . Penicillins   . Statins     Possible myositis    Past Medical History  Diagnosis Date  . Hepatitis   . Cataract   . Hypertension   . GERD (gastroesophageal reflux disease)   . Hyperlipidemia   . Sleep apnea     CPAP  . Hernia, hiatal   . Heart palpitations    Past Surgical History  Procedure Laterality Date  . Appendectomy  2004  . Cholecystectomy    . Retinal detachment surgery    . Cataract extraction w/ intraocular lens implant      both eyes   History   Social History  . Marital Status: Married    Spouse Name: N/A    Number of Children: 2  . Years of Education: N/A   Occupational History  .  Other   Social History Main Topics  . Smoking status: Former Games developer  . Smokeless tobacco: Not on file     Comment: QUIT IN 1990  . Alcohol Use: No  . Drug Use: Not on file  . Sexual Activity: Yes   Other Topics Concern  . Not on file   Social History Narrative   Two grandchildren.            Family History  Problem Relation Age of Onset  . Cancer Father 68  . Hypertension Mother   . CVA Mother    Patient Active Problem List   Diagnosis Date Noted  . Edema 08/17/2013  . H/O bone density study 03/16/2013  . 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 Unspecified hereditary and idiopathic peripheral neuropathy  03/05/2013  . Unspecified vitamin D deficiency 03/05/2013  . Fatty infiltration of liver 02/08/2013  . Other and unspecified hyperlipidemia 02/08/2013  . Morbid obesity 02/08/2013  . Cellulitis of groin, right 12/30/2012  . DJD (degenerative joint disease) 12/17/2012  . GERD (gastroesophageal reflux disease) 12/17/2012  . Peripheral neuropathy 12/17/2012  . HOCM (hypertrophic obstructive cardiomyopathy) 10/16/2012  . HTN (hypertension) 10/16/2012  . Sleep apnea 10/16/2012  . Heart palpitations    Current Outpatient Prescriptions on File Prior to Visit  Medication Sig Dispense Refill  . ASPIRIN PO Take 81 mg by mouth.       . diltiazem (CARDIZEM CD) 240 MG 24 hr capsule Take 1 capsule (240 mg total) by mouth daily.  90 capsule  3  . furosemide (LASIX) 20 MG tablet Take 1 tablet (20 mg total) by mouth daily.  30 tablet  11  . hydrochlorothiazide 25 MG tablet Take 25 mg by mouth daily.        . ranitidine (ZANTAC) 150 MG tablet Take 150 mg by mouth 2 (two) times daily.      Marland Kitchen  TOPROL XL 50 MG 24 hr tablet Take a 1 tab in am and 1/2 of 50 mg in pm  45 tablet  11  . nabumetone (RELAFEN) 500 MG tablet Take 1 tablet (500 mg total) by mouth 2 (two) times daily.  30 tablet  0   No current facility-administered medications on file prior to visit.      Review of Systems See HPI    Objective:   Physical Exam  Physical Exam  Nursing note and vitals reviewed.  Constitutional: She is oriented to person, place, and time. She appears well-developed and well-nourished.  HENT:  Head: Normocephalic and atraumatic.  Cardiovascular: Normal rate and regular rhythm. Exam reveals no gallop and no friction rub.  No murmur heard.  Pulmonary/Chest: Breath sounds normal. She has no wheezes. She has no rales.  Neurological: She is alert and oriented to person, place, and time. UE  Reflexes 2+ symmetric Sensation intact UE Motor  5/5 UE muslce groups Skin: Skin is warm and dry.  Ext  She has 2+ edema  bilaterally Psychiatric: She has a normal mood and affect. Her behavior is normal.            Assessment & Plan:  DJD/  Bone spurs.  She has been able  To tolerate tramadol in the past  willl RX for her  And refer to rheumatology  Edema  Not sure why she has both Lasix and HCTZ prescribed for her.  Advised ot take Lasix 40 mg daily (2 tablets) for the next 6 days and she can hold the HCTZ .  She may need  A daily dose of 40mg  ongoing.  She is to see me in 10-14 days.  Will need to check K at that time.    LE edema  See above  HOCM  Managed by Dr. Antoine Poche

## 2013-08-31 ENCOUNTER — Ambulatory Visit: Payer: Medicare Other | Admitting: Internal Medicine

## 2013-09-07 ENCOUNTER — Ambulatory Visit (INDEPENDENT_AMBULATORY_CARE_PROVIDER_SITE_OTHER): Payer: Medicare Other | Admitting: Internal Medicine

## 2013-09-07 ENCOUNTER — Encounter: Payer: Self-pay | Admitting: Internal Medicine

## 2013-09-07 VITALS — BP 128/76 | HR 65 | Temp 97.6°F | Resp 16 | Wt 280.0 lb

## 2013-09-07 DIAGNOSIS — R609 Edema, unspecified: Secondary | ICD-10-CM

## 2013-09-07 DIAGNOSIS — M1990 Unspecified osteoarthritis, unspecified site: Secondary | ICD-10-CM

## 2013-09-07 DIAGNOSIS — Z23 Encounter for immunization: Secondary | ICD-10-CM

## 2013-09-07 LAB — BASIC METABOLIC PANEL
CO2: 29 mEq/L (ref 19–32)
Calcium: 9.5 mg/dL (ref 8.4–10.5)
Glucose, Bld: 97 mg/dL (ref 70–99)
Potassium: 4.2 mEq/L (ref 3.5–5.3)
Sodium: 139 mEq/L (ref 135–145)

## 2013-09-07 MED ORDER — POTASSIUM CHLORIDE CRYS ER 20 MEQ PO TBCR: 20.0000 meq | EXTENDED_RELEASE_TABLET | Freq: Every day | ORAL | Status: DC

## 2013-09-07 MED ORDER — FUROSEMIDE 40 MG PO TABS: 40.0000 mg | ORAL_TABLET | Freq: Every day | ORAL | Status: DC

## 2013-09-07 NOTE — Patient Instructions (Addendum)
See me in 3-4 months 

## 2013-09-07 NOTE — Addendum Note (Signed)
Addended by: Mathews Robinsons on: 09/07/2013 12:43 PM   Modules accepted: Orders

## 2013-09-07 NOTE — Progress Notes (Signed)
Subjective:    Patient ID: Victoria Delgado, female    DOB: 15-Jun-1941, 72 y.o.   MRN: [id number]  HPI  Victoria Delgado is here for return follow up.  She is responding well to Tramadol  Allergies  Allergen Reactions  . Codeine   . Kenalog [Triamcinolone Acetonide]   . Penicillins   . Statins     Possible myositis    Past Medical History  Diagnosis Date  . Hepatitis   . Cataract   . Hypertension   . GERD (gastroesophageal reflux disease)   . Hyperlipidemia   . Sleep apnea     CPAP  . Hernia, hiatal   . Heart palpitations    Past Surgical History  Procedure Laterality Date  . Appendectomy  2004  . Cholecystectomy    . Retinal detachment surgery    . Cataract extraction w/ intraocular lens implant      both eyes   History   Social History  . Marital Status: Married    Spouse Name: N/A    Number of Children: 2  . Years of Education: N/A   Occupational History  .  Other   Social History Main Topics  . Smoking status: Former Games developer  . Smokeless tobacco: Not on file     Comment: QUIT IN 1990  . Alcohol Use: No  . Drug Use: Not on file  . Sexual Activity: Yes   Other Topics Concern  . Not on file   Social History Narrative   Two grandchildren.            Family History  Problem Relation Age of Onset  . Cancer Father 24  . Hypertension Mother   . CVA Mother    Patient Active Problem List   Diagnosis Date Noted  . Edema 08/17/2013  . H/O bone density study 03/16/2013  . 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 Unspecified hereditary and idiopathic peripheral neuropathy 03/05/2013  . Unspecified vitamin D deficiency 03/05/2013  . Fatty infiltration of liver 02/08/2013  . Other and unspecified hyperlipidemia 02/08/2013  . Morbid obesity 02/08/2013  . Cellulitis of groin, right 12/30/2012  . DJD (degenerative joint disease) 12/17/2012  . GERD (gastroesophageal reflux disease) 12/17/2012  . Peripheral neuropathy 12/17/2012  . HOCM (hypertrophic obstructive  cardiomyopathy) 10/16/2012  . HTN (hypertension) 10/16/2012  . Sleep apnea 10/16/2012  . Heart palpitations    Current Outpatient Prescriptions on File Prior to Visit  Medication Sig Dispense Refill  . ASPIRIN PO Take 81 mg by mouth.       . diltiazem (CARDIZEM CD) 240 MG 24 hr capsule Take 1 capsule (240 mg total) by mouth daily.  90 capsule  3  . hydrochlorothiazide 25 MG tablet Take 25 mg by mouth daily.        . nabumetone (RELAFEN) 500 MG tablet Take 1 tablet (500 mg total) by mouth 2 (two) times daily.  30 tablet  0  . ranitidine (ZANTAC) 150 MG tablet Take 150 mg by mouth 2 (two) times daily.      . TOPROL XL 50 MG 24 hr tablet Take a 1 tab in am and 1/2 of 50 mg in pm  45 tablet  11  . traMADol (ULTRAM) 50 MG tablet Take 1 tablet (50 mg total) by mouth every 6 (six) hours as needed for pain.  12 tablet  1   No current facility-administered medications on file prior to visit.      Review of Systems    see HPI Objective:  Physical Exam  Physical Exam  Nursing note and vitals reviewed.  Constitutional: She is oriented to person, place, and time. She appears well-developed and well-nourished.  HENT:  Head: Normocephalic and atraumatic.  Cardiovascular: Normal rate and regular rhythm. Exam reveals no gallop and no friction rub.  No murmur heard.  Pulmonary/Chest: Breath sounds normal. She has no wheezes. She has no rales.  Neurological: She is alert and oriented to person, place, and time.  Skin: Skin is warm and dry.  Psychiatric: She has a normal mood and affect. Her behavior is normal.             Assessment & Plan:  Neck pain DJD  Improved . She has upcoming appt with Dr. Corliss Skains  LE edema  Will stay on lasix 40 mg daily and K 20 meq daily  Will check BMP today  See me as needed

## 2013-09-08 ENCOUNTER — Encounter: Payer: Self-pay | Admitting: *Deleted

## 2013-11-11 ENCOUNTER — Other Ambulatory Visit: Payer: Self-pay

## 2013-11-11 MED ORDER — TOPROL XL 50 MG PO TB24: ORAL_TABLET | ORAL | Status: DC

## 2013-12-08 ENCOUNTER — Ambulatory Visit (HOSPITAL_BASED_OUTPATIENT_CLINIC_OR_DEPARTMENT_OTHER)
Admission: RE | Admit: 2013-12-08 | Discharge: 2013-12-08 | Disposition: A | Discharge status: Home / Self Care | Payer: Medicare Other | Source: Ambulatory Visit | Attending: Rheumatology | Admitting: Rheumatology

## 2013-12-08 ENCOUNTER — Other Ambulatory Visit (HOSPITAL_BASED_OUTPATIENT_CLINIC_OR_DEPARTMENT_OTHER): Payer: Self-pay | Admitting: Rheumatology

## 2013-12-08 DIAGNOSIS — M545 Low back pain, unspecified: Secondary | ICD-10-CM

## 2013-12-08 DIAGNOSIS — M47817 Spondylosis without myelopathy or radiculopathy, lumbosacral region: Secondary | ICD-10-CM | POA: Insufficient documentation

## 2013-12-08 DIAGNOSIS — M51379 Other intervertebral disc degeneration, lumbosacral region without mention of lumbar back pain or lower extremity pain: Secondary | ICD-10-CM | POA: Insufficient documentation

## 2013-12-08 DIAGNOSIS — M5137 Other intervertebral disc degeneration, lumbosacral region: Secondary | ICD-10-CM | POA: Insufficient documentation

## 2013-12-09 ENCOUNTER — Encounter: Payer: Self-pay | Admitting: Cardiovascular Disease

## 2013-12-09 ENCOUNTER — Ambulatory Visit (INDEPENDENT_AMBULATORY_CARE_PROVIDER_SITE_OTHER): Payer: Medicare Other | Admitting: Cardiovascular Disease

## 2013-12-09 VITALS — BP 127/66 | HR 64 | Ht 68.0 in | Wt 274.7 lb

## 2013-12-09 DIAGNOSIS — M1990 Unspecified osteoarthritis, unspecified site: Secondary | ICD-10-CM

## 2013-12-09 DIAGNOSIS — I10 Essential (primary) hypertension: Secondary | ICD-10-CM

## 2013-12-09 DIAGNOSIS — R609 Edema, unspecified: Secondary | ICD-10-CM

## 2013-12-09 DIAGNOSIS — G4730 Sleep apnea, unspecified: Secondary | ICD-10-CM

## 2013-12-09 DIAGNOSIS — R002 Palpitations: Secondary | ICD-10-CM

## 2013-12-09 NOTE — Patient Instructions (Signed)
Your physician recommends that you schedule a follow-up sleep clinic appointment  In 2 months. Follow up with [facility] as directed for equipment.

## 2013-12-24 ENCOUNTER — Telehealth: Payer: Self-pay | Admitting: *Deleted

## 2013-12-24 NOTE — Telephone Encounter (Signed)
Patient called stating that she needs a tier exception for brand name toprol xl. She has [employer] medicare complete and her id # is [id number]. The # she was given for us to call is [phone].  Thanks, MI

## 2013-12-25 ENCOUNTER — Telehealth: Payer: Self-pay | Admitting: *Deleted

## 2013-12-25 NOTE — Telephone Encounter (Signed)
Patient called about tier exception and she stated that it is critical that the tier exception is handled soon as she has frequent palpitations and really needs the brand name toprol xl. I am routing to you to see if it is possible that you could handle this in Kim's absence. Thanks, MI

## 2013-12-25 NOTE — Telephone Encounter (Signed)
Spoke with pt.  She tried generic metoprolol with no success years ago with Dr. Clarene DukeLittle.  Will try to pull paper chart prior to working on tier exception.  She is aware this will take up to a week and will go ahead and pay for prescription this month.

## 2013-12-28 ENCOUNTER — Encounter: Payer: Self-pay | Admitting: Cardiovascular Disease

## 2013-12-28 NOTE — Progress Notes (Signed)
Patient ID: Victoria Delgado, female   DOB: 12/18/1940, 73 y.o.   MRN: [id number]     HPI: Victoria Delgado, is a 73 y.o. female who presents for sleep clinic evaluation. She has a long-standing history of obstructive sleep apnea and has been on CPAP therapy since 2008. She is in need for a new machine.  Ms. Victoria Delgado is a 73 year old female who is a former patient of Dr. Rex Kras. She has a history of ACE symmetrical septal hypertrophy with a subvalvular 35 mm gradient noted on a 2008 echo. There is a history of hypertension, hyperlipidemia with statin intolerance, as well as intermittent lower extremity edema.  In 2008 she was diagnosed as having obstructive sleep apnea that was in the moderate range overall with an AHI of 25 per hour. However, her sleep apnea was severe during REM sleep NHI of 68.7. She has been on CPAP set at a pressure of 8 cm. She relates that the CPAP has been a "life changer."  She has used it very compliantly. Her machine is old. She has previously been exceptionally compliant. Recently, there have been some issues with intermittent malfunction. She presents today for reevaluation prior to obtaining a new CPAP machine.  On therapy, she denies significant residual daytime sleepiness. A new Epworth Sleepiness Scale score was calculated and results are shown below. She denies restless legs. She is unaware bruxism. She denies hypersomnolence.   Epworth Sleepiness Scale: Situation   Chance of Dozing/Sleeping (0 = never , 1 = slight chance , 2 = moderate chance , 3 = high chance )   sitting and reading 1   watching TV 0   sitting inactive in a public place 0   being a passenger in a motor vehicle for an hour or more 0   lying down in the afternoon 0   sitting and talking to someone 0   sitting quietly after lunch (no alcohol) 0   while stopped for a few minutes in traffic as the driver 0   Total Score  1    Past Medical History  Diagnosis Date  . Hepatitis   . Cataract     . Hypertension   . GERD (gastroesophageal reflux disease)   . Hyperlipidemia   . Sleep apnea     CPAP  . Hernia, hiatal   . Heart palpitations     Past Surgical History  Procedure Laterality Date  . Appendectomy  2004  . Cholecystectomy    . Retinal detachment surgery    . Cataract extraction w/ intraocular lens implant      both eyes    Allergies  Allergen Reactions  . Codeine   . Kenalog [Triamcinolone Acetonide]   . Penicillins   . Relafen [Nabumetone]     Edema   . Statins     Possible myositis     Current Outpatient Prescriptions  Medication Sig Dispense Refill  . ASPIRIN PO Take 81 mg by mouth.       . diltiazem (CARDIZEM CD) 240 MG 24 hr capsule Take 1 capsule (240 mg total) by mouth daily.  90 capsule  3  . furosemide (LASIX) 40 MG tablet Take 1 tablet (40 mg total) by mouth daily.  30 tablet  4  . NON FORMULARY CPAP THERAPY      . potassium chloride SA (K-DUR,KLOR-CON) 20 MEQ tablet Take 1 tablet (20 mEq total) by mouth daily.  30 tablet  4  . ranitidine (ZANTAC) 150 MG tablet Take  150 mg by mouth 2 (two) times daily.      . TOPROL XL 50 MG 24 hr tablet Take a 1 tab in am and 1/2 of 50 mg in pm  45 tablet  6   No current facility-administered medications for this visit.    Social history is notable in that she is married has 2 children and 2 grandchildren. There is no tobacco use. She has done water aerobics for exercise.  ROS negative for fever, chills or night sweats. She denies change in vision or hearing. She denies lymphadenopathy. She is unaware of any bruxism. She denies wheezing. There is no chest pressure. She denies nausea vomiting or diarrhea. She denies blood in stool or urine. At times she does note some mild hip discomfort. She denies claudication. She denies significant recent edema on her current dose of furosemide 40 mg. She does note rare palpitations for which she has been on beta blocker therapy. Grossly perspective, c Pap is working she  denies any breakthrough snoring. She denies hypersomnolence. There is no restless legs. She denies hypnagogic hallucinations. Other comprehensive 14 system review is negative.  PE BP 127/66  Pulse 64  Ht 5' 8"  (1.727 m)  Wt 274 lb 11.2 oz (124.603 kg)  BMI 41.78 kg/m2  General: Alert, oriented, no distress.  Skin: normal turgor, no rashes HEENT: Normocephalic, atraumatic. Pupils round and reactive; sclera anicteric; extraocular muscles intact; Fundi without hemorrhages or exudate Nose without nasal septal hypertrophy Mouth/Parynx benign; Mallinpatti scale 2 Neck: No JVD, no carotid briuts Lungs: clear to ausculatation and percussion; no wheezing or rales  Chest wall: No tenderness to palpation Heart: RRR, s1 s2 normal 2/6 systolic murmur Abdomen: soft, nontender; no hepatosplenomehaly, BS+; abdominal aorta nontender and not dilated by palpation. Back: No CVA tenderness Pulses 2+ Extremities: no clubbinbg cyanosis or edema, Homan's sign negative  Neurologic: grossly nonfocal; cranial nerves intact. Psychological: Normal affect and mood.   LABS:  BMET    Component Value Date/Time   NA 139 09/07/2013 1344   K 4.2 09/07/2013 1344   CL 103 09/07/2013 1344   CO2 29 09/07/2013 1344   GLUCOSE 97 09/07/2013 1344   BUN 15 09/07/2013 1344   CREATININE 0.92 09/07/2013 1344   CREATININE 0.92 03/05/2011 0920   CALCIUM 9.5 09/07/2013 1344   GFRNONAA >60 03/05/2011 0920   GFRAA  Value: >60        The eGFR has been calculated using the MDRD equation. This calculation has not been validated in all clinical situations. eGFR's persistently <60 mL/min signify possible Chronic Kidney Disease. 03/05/2011 0920     Hepatic Function Panel     Component Value Date/Time   PROT 6.5 03/05/2013 0906   ALBUMIN 4.5 03/05/2013 0906   AST 17 03/05/2013 0906   ALT 19 03/05/2013 0906   ALKPHOS 85 03/05/2013 0906   BILITOT 0.4 03/05/2013 0906     CBC    Component Value Date/Time   WBC 8.6 03/05/2013 0906   RBC 5.09  03/05/2013 0906   HGB 15.3* 03/05/2013 0906   HCT 44.3 03/05/2013 0906   PLT 284 03/05/2013 0906   MCV 87.0 03/05/2013 0906   MCH 30.1 03/05/2013 0906   MCHC 34.5 03/05/2013 0906   RDW 14.1 03/05/2013 0906   LYMPHSABS 2.2 03/05/2013 0906   MONOABS 0.9 03/05/2013 0906   EOSABS 0.2 03/05/2013 0906   BASOSABS 0.0 03/05/2013 0906     BNP No results found for this basename: probnp    Lipid  Panel     Component Value Date/Time   CHOL 261* 03/05/2013 0906   TRIG 252* 03/05/2013 0906   HDL 34* 03/05/2013 0906   CHOLHDL 7.7 03/05/2013 0906   VLDL 50* 03/05/2013 0906   LDLCALC 177* 03/05/2013 0906     RADIOLOGY: Mr Lumbar Spine Wo Contrast  12/08/2013   CLINICAL DATA:  Chronic lower back pain with bilateral leg weakness.  EXAM: MRI LUMBAR SPINE WITHOUT CONTRAST  TECHNIQUE: Multiplanar, multisequence MR imaging was performed. No intravenous contrast was administered.  COMPARISON:  CT abdomen and pelvis 11/26/2013  FINDINGS: Vertebral alignment is normal. Vertebral body heights are preserved. Mild multilevel disc space narrowing and disc desiccation are present throughout the lumbar spine. There is mild associated endplate spurring and adjacent degenerative marrow change, most notably at L2-3 anteriorly. The conus medullaris is normal in signal and terminates at T12-L1. The visualized paraspinal soft tissues are unremarkable.  L1-2: Trace disc bulge and mild left greater than right facet hypertrophy without spinal canal or neural foraminal stenosis.  L2-3: Mild to moderate circumferential disc bulge and mild facet hypertrophy result in mild bilateral lateral recess narrowing and mild bilateral neural foraminal narrowing. No spinal canal stenosis.  L3-4: Mild to moderate circumferential disc bulge and bilateral facet hypertrophy result in mild bilateral neural foraminal narrowing without spinal canal stenosis.  L4-5: Mild circumferential disc bulge and moderate facet hypertrophy result in mild bilateral lateral recess narrowing  without neural foraminal or spinal canal stenosis.  L5-S1: Trace disc bulge without spinal canal or neural foraminal stenosis.  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   Electronically Signed   By: Logan Bores   On: 12/08/2013 21:08      ASSESSMENT AND PLAN:  Ms. Arryn Terrones has a history of documented moderate sleep apnea which is very severe during REM sleep since her initial sleep study in 2008. She has consistently been using her CPAP therapy. I did obtain data from her machine which she brought with her and her today. It appears that he says is excellent. Recently, there has been some intermittent malfunction. She meets criteria for a new CPAP machine and have [facility]. She will be seen in sleep clinic in approximately 2 months after her new machine for a sleep clinic evaluation. She'll continue to take her current medical regimen. Apparently, she does have significant hyperlipidemia but in the past has been intolerant to statins. It may be worthwhile to consider a trial of Zetia to her medical regimen. She also has multilevel level degenerative disc disease and facet arthrosis without significant stenosis noted on recent CT imaging.     Troy Sine, MD, [facility]  12/28/2013 8:24 PM

## 2014-01-04 NOTE — Telephone Encounter (Signed)
Spoke with insurance company.  She was changed from generic to brand Toprol in January 2009.  Put in request for tier exception (#[id number]).  Will be 24-48 hours until request is processed.

## 2014-01-05 ENCOUNTER — Ambulatory Visit (INDEPENDENT_AMBULATORY_CARE_PROVIDER_SITE_OTHER): Payer: Medicare Other | Admitting: Internal Medicine

## 2014-01-05 ENCOUNTER — Encounter: Payer: Self-pay | Admitting: Internal Medicine

## 2014-01-05 VITALS — BP 131/81 | HR 53 | Temp 98.3°F | Resp 18 | Wt 273.0 lb

## 2014-01-05 DIAGNOSIS — R002 Palpitations: Secondary | ICD-10-CM

## 2014-01-05 DIAGNOSIS — R609 Edema, unspecified: Secondary | ICD-10-CM

## 2014-01-05 DIAGNOSIS — E785 Hyperlipidemia, unspecified: Secondary | ICD-10-CM

## 2014-01-05 DIAGNOSIS — I10 Essential (primary) hypertension: Secondary | ICD-10-CM

## 2014-01-05 LAB — BASIC METABOLIC PANEL
BUN: 19 mg/dL (ref 6–23)
CO2: 31 mEq/L (ref 19–32)
Calcium: 10.2 mg/dL (ref 8.4–10.5)
Chloride: 100 mEq/L (ref 96–112)
Creat: 1.01 mg/dL (ref 0.50–1.10)
Glucose, Bld: 105 mg/dL — ABNORMAL HIGH (ref 70–99)
Potassium: 4.5 mEq/L (ref 3.5–5.3)
Sodium: 141 mEq/L (ref 135–145)

## 2014-01-05 NOTE — Patient Instructions (Signed)
Schedule CPE  Go to lab today

## 2014-01-05 NOTE — Progress Notes (Signed)
   Subjective:    Patient ID: Victoria Delgado, female    DOB: May 01, 1941, 73 y.o.   MRN: [id number]  HPI Victoria Delgado is here for follow up several issues.  She has seen Dr. Corliss Skains and has follow up with her in March  She has lost 7# since last visit.  She is taking her Lasix 40 mg but only taking 1/2 of her K.  Seh thought the whole K pill was causing palpitations.  Edema is gone  Mulltiple bulging discs on MRI    She tried tramadol but she had auditory  Hallucinations with it.  Very intolerant of most pain meds   Review of Systems    see HPI Objective:   Physical Exam Physical Exam  Nursing note and vitals reviewed.   Weight down 7# Constitutional: She is oriented to person, place, and time. She appears well-developed and well-nourished.  HENT:  Head: Normocephalic and atraumatic.  Cardiovascular: Normal rate and regular rhythm. Exam reveals no gallop and no friction rub.  No murmur heard.  Pulmonary/Chest: Breath sounds normal. She has no wheezes. She has no rales.  Neurological: She is alert and oriented to person, place, and time.  Skin: Skin is warm and dry.  Edema  No edema today Psychiatric: She has a normal mood and affect. Her behavior is normal.         Assessment & Plan:  Edema  Much improved.  Continue lasix 40 mg    ADvised ot take whole 20 mg of K daily.  Check K today  Htn continue meds  DJD  Lumbar stenosis  Will follow with Dr. Corliss Skains  Palpitations  continue meds   Schedule CPE with me

## 2014-01-06 ENCOUNTER — Encounter: Payer: Self-pay | Admitting: Internal Medicine

## 2014-01-11 NOTE — Telephone Encounter (Signed)
Spoke with pt.  She was denied tier exception because she has not tried any formulary alternatives.  She is aware of this and will pay out of pocket until she sees Dr. Antoine Poche again and discusses other options.

## 2014-02-02 ENCOUNTER — Other Ambulatory Visit: Payer: Self-pay | Admitting: Internal Medicine

## 2014-02-02 MED ORDER — POTASSIUM CHLORIDE CRYS ER 20 MEQ PO TBCR: 20.0000 meq | EXTENDED_RELEASE_TABLET | Freq: Every day | ORAL | Status: DC

## 2014-02-02 NOTE — Telephone Encounter (Deleted)
Refill request

## 2014-02-08 ENCOUNTER — Ambulatory Visit: Payer: 59 | Admitting: Cardiovascular Disease

## 2014-02-18 ENCOUNTER — Ambulatory Visit (INDEPENDENT_AMBULATORY_CARE_PROVIDER_SITE_OTHER): Payer: Medicare Other | Admitting: Cardiovascular Disease

## 2014-02-18 VITALS — BP 127/62 | HR 59 | Ht 68.0 in | Wt 277.4 lb

## 2014-02-18 DIAGNOSIS — I421 Obstructive hypertrophic cardiomyopathy: Secondary | ICD-10-CM

## 2014-02-18 DIAGNOSIS — I10 Essential (primary) hypertension: Secondary | ICD-10-CM

## 2014-02-18 DIAGNOSIS — G4730 Sleep apnea, unspecified: Secondary | ICD-10-CM

## 2014-02-18 NOTE — Patient Instructions (Signed)
Your physician recommends that you schedule a follow-up appointment in: AS NEEDED WITH DR. Tresa Endo FOR SLEEP.

## 2014-02-23 ENCOUNTER — Other Ambulatory Visit: Payer: Self-pay | Admitting: Dermatology

## 2014-03-18 ENCOUNTER — Encounter: Payer: Self-pay | Admitting: Cardiovascular Disease

## 2014-03-18 NOTE — Progress Notes (Signed)
Patient ID: Victoria Delgado, female   DOB: 1941/06/08, 74 y.o.   MRN: [id number]     HPI: Victoria Delgado is a 73 y.o. female who is a former patient of Dr. Rex Kras and now sees Dr. Percival Spanish for   cardiology care.  She has a history of obstructive sleep apnea and presents to the office today following obtaining new CPAP and compliance requirements.  Victoria Delgado has a history of obstructive sleep apnea that dates back to her initial sleep study in 2008.  At that time, sleep apnea, was moderate overall, with an HA of approximately 25 per hour, but very severe during REM sleep with an AHI of 68.7 per hour.  On her initial sleep study, she had significant nocturnal oxygen desaturation to 78% with REM sleep and had loud snoring.  She has been on CPAP therapy since that time. She tells me approximately one month ago.  She had new equipment and now has a ResMed air since 10 AutoSet unit, which is set at a set pressure of 8 cm.  She also has an air-fluid mask.  She has noticed marked improvement in the technology with the system being exceptionally quiet in the mass being much more comfortable.  The download was obtained from 01/16/2014 through 02/14/2014.  I reviewed this.  She is 100% compliant with an average of 7 hours and 19 minutes of daily use.  HI is excellent at 1.3, with an apnea index of 1.1, and hypotony index of 0.2.  Examination of her therapy report indicates excellent mask fit with no leak of significance.  Presently, she is unaware of residual daytime sleepiness.  A complete Epworth scale is noted below.   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    Past Medical History  Diagnosis Date  . Hepatitis   . Cataract   . Hypertension   . GERD (gastroesophageal reflux disease)   . Hyperlipidemia   . Sleep apnea     CPAP  . Hernia, hiatal   . Heart palpitations     Past Surgical History  Procedure Laterality Date  . Appendectomy  2004  . Cholecystectomy    . Retinal detachment surgery    . Cataract extraction w/ intraocular lens implant      both eyes    Allergies  Allergen Reactions  . Codeine   . Kenalog [Triamcinolone Acetonide]   . Penicillins   . Relafen [Nabumetone]     Edema   . Statins     Possible myositis   . Tramadol     Halllucinations    Current Outpatient Prescriptions  Medication Sig Dispense Refill  . ASPIRIN  PO Take 81 mg by mouth.       . diltiazem (CARDIZEM CD) 240 MG 24 hr capsule Take 1 capsule (240 mg total) by mouth daily.  90 capsule  3  . furosemide (LASIX) 40 MG tablet TAKE 1 TABLET BY MOUTH DAILY  90 tablet  1  . NON FORMULARY CPAP THERAPY      . potassium chloride SA (K-DUR,KLOR-CON) 20 MEQ tablet Take 1 tablet (20 mEq total) by mouth daily.  90 tablet  1  . ranitidine (ZANTAC) 150 MG tablet Take 150 mg by mouth 2 (two) times daily.      . TOPROL XL 50 MG 24 hr tablet Take a 1 tab in am and 1/2 of 50 mg in pm  45 tablet  6   No current facility-administered medications for this visit.    Socially, she is married.  She has 2 children, and 2 grandchildren.  There is remote tobacco history, having quit in 1990.  She does not routinely exercise.  ROS negative for fever, chills or night sweats.  She denies any  significant weight change.  Has a long-standing history of obesity.  She denies change in vision or hearing.  She is unaware of lymphadenopathy.  She denies PND, or orthopnea.  She is unaware of recent palpitations.  She denies chest pressure.  She denies nausea, vomiting, or diarrhea.  She denies blood in stool or urine.  She denies claudication.  She does admit to occasional leg swelling appear  With CPAP, she denies breakthrough snoring.  There is no bruxism.  She denies hypnagogic hallucinations.  She denies residual daytime sleepiness.  Other comprehensive 14 point system review is negative.  PE BP 127/62  Pulse 59  Ht 5' 8"  (1.727 m)  Wt 277 lb 6.4 oz (125.828 kg)  BMI 42.19 kg/m2  General: Alert, oriented, no distress.  Skin: normal turgor, no rashes HEENT: Normocephalic, atraumatic. Pupils round and reactive; sclera anicteric; extraocular muscles intact; Fundi without hemorrhages or AV nicking Nose without nasal septal hypertrophy Mouth/Parynx benign; Mallinpatti scale 3 Neck: No JVD, no carotid bruits; transmitted murmur to carotids Lungs: clear to ausculatation and percussion; no wheezing or rales  Chest wall: No tenderness to palpation Heart: PMI not display RRR, s1 s2 normal; 2/6 systolic murmur with slight increase with Valsalva maneuver Abdomen: soft, nontender; no hepatosplenomehaly, BS+; abdominal aorta nontender and not dilated by palpation. Back: No CVA tenderness Pulses 2+ Extremities: Trivial edema; no clubbinbg cyanosis, Homan's sign negative  Neurologic: grossly nonfocal; cranial nerves intact. Psychological: Normal affect and mood.    LABS:  BMET    Component Value Date/Time   NA 141 01/05/2014 1343   K 4.5 01/05/2014 1343   CL 100 01/05/2014 1343   CO2 31 01/05/2014 1343   GLUCOSE 105* 01/05/2014 1343   BUN 19 01/05/2014 1343   CREATININE 1.01 01/05/2014 1343   CREATININE 0.92 03/05/2011 0920   CALCIUM 10.2 01/05/2014 1343   GFRNONAA >60 03/05/2011 0920   GFRAA  Value:  >60        The eGFR has been calculated using the MDRD equation. This calculation has not been validated in all clinical situations. eGFR's persistently <60 mL/min signify possible Chronic Kidney Disease. 03/05/2011 0920     Hepatic Function Panel     Component Value Date/Time   PROT 6.5 03/05/2013 0906   ALBUMIN 4.5 03/05/2013 0906   AST 17 03/05/2013 0906   ALT 19 03/05/2013 0906   ALKPHOS 85 03/05/2013 0906  BILITOT 0.4 03/05/2013 0906     CBC    Component Value Date/Time   WBC 8.6 03/05/2013 0906   RBC 5.09 03/05/2013 0906   HGB 15.3* 03/05/2013 0906   HCT 44.3 03/05/2013 0906   PLT 284 03/05/2013 0906   MCV 87.0 03/05/2013 0906   MCH 30.1 03/05/2013 0906   MCHC 34.5 03/05/2013 0906   RDW 14.1 03/05/2013 0906   LYMPHSABS 2.2 03/05/2013 0906   MONOABS 0.9 03/05/2013 0906   EOSABS 0.2 03/05/2013 0906   BASOSABS 0.0 03/05/2013 0906     BNP No results found for this basename: probnp    Lipid Panel     Component Value Date/Time   CHOL 261* 03/05/2013 0906   TRIG 252* 03/05/2013 0906   HDL 34* 03/05/2013 0906   CHOLHDL 7.7 03/05/2013 0906   VLDL 50* 03/05/2013 0906   LDLCALC 177* 03/05/2013 0906     RADIOLOGY: No results found.    ASSESSMENT AND PLAN: Victoria Delgado is a 73 year old female who is now followed by Dr. Percival Spanish for cardiology care.  She has a history of hypertrophic cardiomyopathy, hypertension, intermittent, peripheral edema, and hyperlipidemia.  She has been diagnosed as having obstructive sleep apnea since July 2008 and has been on CPAP therapy since that time.  She recently was able to obtain the recently released Air Sense 10 AutoSet CPAP unit, and notes marked improvement from her prior therapy.  I reviewed her compliance report in detail.  She is meeting Medicare compliance standards with 100% use and averaging 7 hours and 19 minutes per night.  AHI is excellent at 1.3.  There is no residual daytime sleepiness.  Her blood pressure today is controlled.  She is unaware of any breakthrough  atrial fibrillation.  I answered all her questions.  I'll be available on an as-needed basis from a sleep perspective.  She will follow up with Dr. Percival Spanish for cardiology care.     Troy Sine, MD, [facility]  03/18/2014 7:10 PM

## 2014-04-20 ENCOUNTER — Ambulatory Visit (INDEPENDENT_AMBULATORY_CARE_PROVIDER_SITE_OTHER): Payer: Medicare Other | Admitting: Internal Medicine

## 2014-04-20 ENCOUNTER — Encounter: Payer: Self-pay | Admitting: Internal Medicine

## 2014-04-20 VITALS — BP 125/64 | HR 53 | Temp 98.1°F | Resp 18 | Ht 68.0 in | Wt 280.0 lb

## 2014-04-20 DIAGNOSIS — I421 Obstructive hypertrophic cardiomyopathy: Secondary | ICD-10-CM

## 2014-04-20 DIAGNOSIS — M1990 Unspecified osteoarthritis, unspecified site: Secondary | ICD-10-CM

## 2014-04-20 DIAGNOSIS — Z139 Encounter for screening, unspecified: Secondary | ICD-10-CM

## 2014-04-20 DIAGNOSIS — K7689 Other specified diseases of liver: Secondary | ICD-10-CM

## 2014-04-20 DIAGNOSIS — K760 Fatty (change of) liver, not elsewhere classified: Secondary | ICD-10-CM

## 2014-04-20 DIAGNOSIS — E559 Vitamin D deficiency, unspecified: Secondary | ICD-10-CM

## 2014-04-20 DIAGNOSIS — Z0000 Encounter for general adult medical examination without abnormal findings: Secondary | ICD-10-CM

## 2014-04-20 DIAGNOSIS — Z1231 Encounter for screening mammogram for malignant neoplasm of breast: Secondary | ICD-10-CM

## 2014-04-20 DIAGNOSIS — I10 Essential (primary) hypertension: Secondary | ICD-10-CM

## 2014-04-20 DIAGNOSIS — E785 Hyperlipidemia, unspecified: Secondary | ICD-10-CM

## 2014-04-20 DIAGNOSIS — K219 Gastro-esophageal reflux disease without esophagitis: Secondary | ICD-10-CM

## 2014-04-20 LAB — POCT URINALYSIS DIPSTICK
Bilirubin, UA: NEGATIVE
Glucose, UA: NEGATIVE
Ketones, UA: NEGATIVE
Nitrite, UA: NEGATIVE
Protein, UA: NEGATIVE
RBC UA: NEGATIVE
Spec Grav, UA: 1.015
UROBILINOGEN UA: NEGATIVE
pH, UA: 6.5

## 2014-04-20 NOTE — Progress Notes (Signed)
Subjective:    Patient ID: Victoria Delgado, female    DOB: 1941/11/18, 73 y.o.   MRN: [id number]  HPI  Talin is here for CPE.    HM:  She is due for her mm  Pap per gyn Dr. Greta Doom.   Pt repeatedly declines another colonoscopy.  She quit smoking 25 years ago.   She is having significant discomfort in both hips from her advanced DJD  and has an upcoming appt with her Orthopedic soon.  She is using Tramadol when pain is severe .      Allergies  Allergen Reactions  . Codeine   . Kenalog [Triamcinolone Acetonide]   . Penicillins   . Relafen [Nabumetone]     Edema   . Statins     Possible myositis   . Tramadol     Halllucinations   Past Medical History  Diagnosis Date  . Hepatitis   . Cataract   . Hypertension   . GERD (gastroesophageal reflux disease)   . Hyperlipidemia   . Sleep apnea     CPAP  . Hernia, hiatal   . Heart palpitations    Past Surgical History  Procedure Laterality Date  . Appendectomy  2004  . Cholecystectomy    . Retinal detachment surgery    . Cataract extraction w/ intraocular lens implant      both eyes   History   Social History  . Marital Status: Married    Spouse Name: N/A    Number of Children: 2  . Years of Education: N/A   Occupational History  .  Other   Social History Main Topics  . Smoking status: Former Games developer  . Smokeless tobacco: Not on file     Comment: QUIT IN 1990  . Alcohol Use: No  . Drug Use: Not on file  . Sexual Activity: Yes   Other Topics Concern  . Not on file   Social History Narrative   Two grandchildren.            Family History  Problem Relation Age of Onset  . Cancer Father 55  . Hypertension Mother   . CVA Mother    Patient Active Problem List   Diagnosis Date Noted  . Edema 08/17/2013  . H/O bone density study 03/16/2013  . 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 Unspecified hereditary and idiopathic peripheral neuropathy 03/05/2013  . Unspecified vitamin D deficiency 03/05/2013  . Fatty  infiltration of liver 02/08/2013  . Other and unspecified hyperlipidemia 02/08/2013  . Morbid obesity 02/08/2013  . Cellulitis of groin, right 12/30/2012  . DJD (degenerative joint disease) 12/17/2012  . GERD (gastroesophageal reflux disease) 12/17/2012  . Peripheral neuropathy 12/17/2012  . HOCM (hypertrophic obstructive cardiomyopathy) 10/16/2012  . HTN (hypertension) 10/16/2012  . Sleep apnea 10/16/2012  . Heart palpitations    Current Outpatient Prescriptions on File Prior to Visit  Medication Sig Dispense Refill  . ASPIRIN PO Take 81 mg by mouth.       . diltiazem (CARDIZEM CD) 240 MG 24 hr capsule Take 1 capsule (240 mg total) by mouth daily.  90 capsule  3  . furosemide (LASIX) 40 MG tablet TAKE 1 TABLET BY MOUTH DAILY  90 tablet  1  . NON FORMULARY CPAP THERAPY      . potassium chloride SA (K-DUR,KLOR-CON) 20 MEQ tablet Take 1 tablet (20 mEq total) by mouth daily.  90 tablet  1  . ranitidine (ZANTAC) 150 MG tablet Take 150 mg by mouth  2 (two) times daily.      . TOPROL XL 50 MG 24 hr tablet Take a 1 tab in am and 1/2 of 50 mg in pm  45 tablet  6   No current facility-administered medications on file prior to visit.      Review of Systems See HPI     Objective:   Physical Exam  Physical Exam  Nursing note and vitals reviewed.  Constitutional: She is oriented to person, place, and time. She appears well-developed and well-nourished.  HENT:  Head: Normocephalic and atraumatic.  Right Ear: Tympanic membrane and ear canal normal. No drainage. Tympanic membrane is not injected and not erythematous.  Left Ear: Tympanic membrane and ear canal normal. No drainage. Tympanic membrane is not injected and not erythematous.  Nose: Nose normal. Right sinus exhibits no maxillary sinus tenderness and no frontal sinus tenderness. Left sinus exhibits no maxillary sinus tenderness and no frontal sinus tenderness.  Mouth/Throat: Oropharynx is clear and moist. No oral lesions. No  oropharyngeal exudate.  Eyes: Conjunctivae and EOM are normal. Pupils are equal, round, and reactive to light.  Neck: Normal range of motion. Neck supple. No JVD present. Carotid bruit is not present. No mass and no thyromegaly present.  Cardiovascular: Normal rate, regular rhythm, S1 normal, S2 normal and intact distal pulses. Exam reveals no gallop and no friction rub.  No murmur heard.  Pulses:  Carotid pulses are 2+ on the right side, and 2+ on the left side.  Dorsalis pedis pulses are 2+ on the right side, and 2+ on the left side.  No carotid bruit. No LE edema  Pulmonary/Chest: Breath sounds normal. She has no wheezes. She has no rales. She exhibits no tenderness.   Breast no discrete masses no nipple discharge no axillary adenopathy bilaterally Abdominal: Soft. Bowel sounds are normal. She exhibits no distension and no mass. There is no hepatosplenomegaly. There is no tenderness. There is no CVA tenderness.  Pelvic and rectal with guaiac  Pt declines   She is in too much hip pain Musculoskeletal: Normal range of motion.  No active synovitis to joints.  Lymphadenopathy:  She has no cervical adenopathy.  She has no axillary adenopathy.  Right: No inguinal and no supraclavicular adenopathy present.  Left: No inguinal and no supraclavicular adenopathy present.  Neurological: She is alert and oriented to person, place, and time. She has normal strength and normal reflexes. She displays no tremor. No cranial nerve deficit or sensory deficit. Coordination and gait normal.  Skin: Skin is warm and dry. No rash noted. No cyanosis. Nails show no clubbing.  Psychiatric: She has a normal mood and affect. Her speech is normal and behavior is normal. Cognition and memory are normal.          Assessment & Plan:  HM  Will schedule 3d  [facility]  She declines colonoscopy and Zostavax.   Quit smoking 25 years ago  HTn continue meds  Advanced DJD  Upcoming appt with Dr.  Despina HickAlusio  HYperlipidemia  She cannot tolerate statins  She wishes to try red yeast rice.   ADvised that it has the same SE profile and if she wishes to try see me in 4-6 weeks for lliver profile  She voices understanding  Obesity  HOCM   Fatty liver  Peripheral neuropathy  ?? Etiology    See me in 4-6 weeks after initiating  Red yeast rice

## 2014-04-20 NOTE — Patient Instructions (Signed)
Schedule 3D mm at [facility] hospital  See me  4-6 weeks after starting red yeast rice

## 2014-04-21 LAB — CBC WITH DIFFERENTIAL/PLATELET
BASOS ABS: 0 10*3/uL (ref 0.0–0.1)
Basophils Relative: 0 % (ref 0–1)
Eosinophils Absolute: 0.1 10*3/uL (ref 0.0–0.7)
Eosinophils Relative: 2 % (ref 0–5)
HEMATOCRIT: 43.6 % (ref 36.0–46.0)
HEMOGLOBIN: 15.2 g/dL — AB (ref 12.0–15.0)
LYMPHS ABS: 2 10*3/uL (ref 0.7–4.0)
Lymphocytes Relative: 28 % (ref 12–46)
MCH: 29.7 pg (ref 26.0–34.0)
MCHC: 34.9 g/dL (ref 30.0–36.0)
MCV: 85.3 fL (ref 78.0–100.0)
MONO ABS: 0.6 10*3/uL (ref 0.1–1.0)
MONOS PCT: 8 % (ref 3–12)
NEUTROS ABS: 4.3 10*3/uL (ref 1.7–7.7)
Neutrophils Relative %: 62 % (ref 43–77)
Platelets: 270 10*3/uL (ref 150–400)
RBC: 5.11 MIL/uL (ref 3.87–5.11)
RDW: 13.8 % (ref 11.5–15.5)
WBC: 7 10*3/uL (ref 4.0–10.5)

## 2014-04-21 LAB — COMPREHENSIVE METABOLIC PANEL
ALT: 17 U/L (ref 0–35)
AST: 16 U/L (ref 0–37)
Albumin: 4.3 g/dL (ref 3.5–5.2)
Alkaline Phosphatase: 102 U/L (ref 39–117)
BILIRUBIN TOTAL: 0.4 mg/dL (ref 0.2–1.2)
BUN: 17 mg/dL (ref 6–23)
CHLORIDE: 103 meq/L (ref 96–112)
CO2: 23 meq/L (ref 19–32)
CREATININE: 0.89 mg/dL (ref 0.50–1.10)
Calcium: 9.4 mg/dL (ref 8.4–10.5)
Glucose, Bld: 104 mg/dL — ABNORMAL HIGH (ref 70–99)
Potassium: 4.8 mEq/L (ref 3.5–5.3)
Sodium: 143 mEq/L (ref 135–145)
Total Protein: 7.1 g/dL (ref 6.0–8.3)

## 2014-04-21 LAB — LIPID PANEL
CHOL/HDL RATIO: 7.3 ratio
CHOLESTEROL: 262 mg/dL — AB (ref 0–200)
HDL: 36 mg/dL — ABNORMAL LOW (ref >39)
LDL Cholesterol: 181 mg/dL — ABNORMAL HIGH (ref 0–99)
Triglycerides: 227 mg/dL — ABNORMAL HIGH (ref <150)
VLDL: 45 mg/dL — ABNORMAL HIGH (ref 0–40)

## 2014-04-21 LAB — TSH: TSH: 2.553 u[IU]/mL (ref 0.350–4.500)

## 2014-04-28 ENCOUNTER — Ambulatory Visit (HOSPITAL_COMMUNITY)
Admission: RE | Admit: 2014-04-28 | Discharge: 2014-04-28 | Disposition: A | Discharge status: Home / Self Care | Payer: Medicare Other | Source: Ambulatory Visit | Attending: Internal Medicine | Admitting: Internal Medicine

## 2014-04-28 ENCOUNTER — Encounter: Payer: Self-pay | Admitting: Cardiovascular Disease

## 2014-04-28 DIAGNOSIS — Z1231 Encounter for screening mammogram for malignant neoplasm of breast: Secondary | ICD-10-CM | POA: Insufficient documentation

## 2014-04-30 ENCOUNTER — Encounter: Payer: Self-pay | Admitting: Cardiology

## 2014-04-30 ENCOUNTER — Ambulatory Visit (INDEPENDENT_AMBULATORY_CARE_PROVIDER_SITE_OTHER): Payer: Medicare Other | Admitting: Cardiology

## 2014-04-30 VITALS — BP 130/72 | HR 54 | Ht 68.0 in | Wt 278.0 lb

## 2014-04-30 DIAGNOSIS — R002 Palpitations: Secondary | ICD-10-CM

## 2014-04-30 DIAGNOSIS — I422 Other hypertrophic cardiomyopathy: Secondary | ICD-10-CM

## 2014-04-30 DIAGNOSIS — I421 Obstructive hypertrophic cardiomyopathy: Secondary | ICD-10-CM

## 2014-04-30 NOTE — Patient Instructions (Signed)

## 2014-04-30 NOTE — Progress Notes (Signed)
HPI The patient presents as a new patient for me. She has seen by another group. She was found in the past to have atrial fibrillation which she ultimately thinks is related to a steroid injection. This was in 2008. At that time she was found to have asymmetric septal hypertrophy. This has been followed conservatively. Her last echocardiogram in May of last year demonstrated a peak gradient of 46. This was slightly increased from previous.  She has had a history of long-standing isolated palpitations. She apparently had some supraventricular tachycardia in the past but she was sick with gallstones at that time.   Since I last saw her she has done well. She is going to have right hip replacement in Sept.  The patient denies any new symptoms such as chest discomfort, neck or arm discomfort. There has been no new shortness of breath, PND or orthopnea. There has been no reported presyncope or syncope.  She has rare palpitations that are less than previous.  She does have pain with walking and is limited.    Allergies  Allergen Reactions  . Codeine   . Kenalog [Triamcinolone Acetonide]   . Penicillins   . Relafen [Nabumetone]     Edema   . Statins     Possible myositis   . Tramadol     Halllucinations    Current Outpatient Prescriptions  Medication Sig Dispense Refill  . ASPIRIN PO Take 81 mg by mouth.       . diltiazem (CARDIZEM CD) 240 MG 24 hr capsule Take 1 capsule (240 mg total) by mouth daily.  90 capsule  3  . furosemide (LASIX) 40 MG tablet TAKE 1 TABLET BY MOUTH DAILY  90 tablet  1  . NON FORMULARY CPAP THERAPY      . potassium chloride SA (K-DUR,KLOR-CON) 20 MEQ tablet Take 1 tablet (20 mEq total) by mouth daily.  90 tablet  1  . ranitidine (ZANTAC) 150 MG tablet Take 150 mg by mouth 2 (two) times daily.      . TOPROL XL 50 MG 24 hr tablet Take a 1 tab in am and 1/2 of 50 mg in pm  45 tablet  6   No current facility-administered medications for this visit.    Past Medical  History  Diagnosis Date  . Hepatitis   . Cataract   . Hypertension   . GERD (gastroesophageal reflux disease)   . Hyperlipidemia   . Sleep apnea     CPAP  . Hernia, hiatal   . Heart palpitations     Past Surgical History  Procedure Laterality Date  . Appendectomy  2004  . Cholecystectomy    . Retinal detachment surgery    . Cataract extraction w/ intraocular lens implant      both eyes    ROS:  As stated in the HPI and negative for all other systems.  PHYSICAL EXAM BP 130/72  Pulse 54  Ht 5\' 8"  (1.727 m)  Wt 278 lb (126.1 kg)  BMI 42.28 kg/m2 GENERAL:  Well appearing HEENT:  Pupils equal round and reactive, fundi not visualized, oral mucosa unremarkable NECK:  No jugular venous distention, waveform within normal limits, carotid upstroke brisk and symmetric, no bruits, no thyromegaly LYMPHATICS:  No cervical, inguinal adenopathy LUNGS:  Clear to auscultation bilaterally BACK:  No CVA tenderness CHEST:  Unremarkable HEART:  PMI not displaced or sustained,S1 and S2 within normal limits, no S3, no S4, no clicks, no rubs, 2/6 systolic murmur radiating out  the aortic outflow tract and into the carotids not increasing with a strain phase of Valsalva, no diastolic murmurs ABD:  Flat, positive bowel sounds normal in frequency in pitch, no bruits, no rebound, no guarding, no midline pulsatile mass, no hepatomegaly, no splenomegaly, obese EXT:  2 plus pulses throughout, trace edema, no cyanosis no clubbing   EKG:  Sinus rhythm, rate 54, RAD, intervals within normal limits, poor anterior R wave progression, no acute ST-T wave changes.  04/30/2014  ASSESSMENT AND PLAN  Hypertrophic cardiomyopathy - I will check another echos since her gradient had increased.  However, she seems to be  Asymptomatic. I will follow this clinically. No change in medications is indicated.  Dyslipidemia - Her LDL is 181 which is increased.  I would like for her to be seen in the lipid  clinic.  Palpitations - She is rarely bothered by these. No change in therapy is indicated.  Hypertension - The blood pressure is at target. No change in medications is indicated. We will continue with therapeutic lifestyle changes (TLC).  Preop - The patient has no high risk findings for MI and she is not going for a high risk procedure.  Therefore, despite her lower functional level, based on ACC/AHA guidelines, the patient would be at acceptable risk for the planned procedure without further cardiovascular testing.   She certainly is at risk for volume excess of this is not watch closely at the time of her procedure. It is very likely that we might see some dysrhythmia such as atrial fibrillation which would nee to be addressed at the time of surgery.

## 2014-05-12 ENCOUNTER — Ambulatory Visit (HOSPITAL_COMMUNITY): Payer: Medicare Other | Attending: Cardiology | Admitting: Radiology

## 2014-05-12 DIAGNOSIS — I4891 Unspecified atrial fibrillation: Secondary | ICD-10-CM

## 2014-05-12 DIAGNOSIS — I422 Other hypertrophic cardiomyopathy: Secondary | ICD-10-CM

## 2014-05-12 DIAGNOSIS — I359 Nonrheumatic aortic valve disorder, unspecified: Secondary | ICD-10-CM

## 2014-05-12 DIAGNOSIS — I421 Obstructive hypertrophic cardiomyopathy: Secondary | ICD-10-CM | POA: Insufficient documentation

## 2014-05-12 NOTE — Progress Notes (Signed)
Echocardiogram performed.  

## 2014-05-28 ENCOUNTER — Other Ambulatory Visit: Payer: Self-pay

## 2014-05-28 MED ORDER — DILTIAZEM HCL ER COATED BEADS 240 MG PO CP24: 240.0000 mg | ORAL_CAPSULE | Freq: Every day | ORAL | Status: DC

## 2014-05-28 MED ORDER — TOPROL XL 50 MG PO TB24: ORAL_TABLET | ORAL | Status: DC

## 2014-07-03 ENCOUNTER — Ambulatory Visit (INDEPENDENT_AMBULATORY_CARE_PROVIDER_SITE_OTHER): Payer: Medicare Other | Admitting: Family Medicine

## 2014-07-03 VITALS — BP 130/70 | HR 57 | Temp 98.4°F | Resp 16 | Ht 68.0 in | Wt 278.0 lb

## 2014-07-03 DIAGNOSIS — L02619 Cutaneous abscess of unspecified foot: Secondary | ICD-10-CM

## 2014-07-03 DIAGNOSIS — L03039 Cellulitis of unspecified toe: Secondary | ICD-10-CM

## 2014-07-03 MED ORDER — MUPIROCIN 2 % EX OINT: TOPICAL_OINTMENT | CUTANEOUS | Status: DC

## 2014-07-03 NOTE — Progress Notes (Signed)
Chief Complaint:  Chief Complaint  Patient presents with  . Toe Pain    right foot, infection, x 10 days    HPI: Victoria Delgado is a 73 y.o. female who is here for  10 day history of changes of skin near left great toe, she thought it was an infection so has been soaking it, she has tried neosporin on it. NKI. Wants to know if it is infected. Has neurpoathy but just noticed skin color changes today. Had pain and redness but now gone, tried eposon salt bath and also neosporin . Has heart isseus so does not want anything oral which would make her heart beat fast or irregular.   Past Medical History  Diagnosis Date  . Hepatitis   . Cataract   . Hypertension   . GERD (gastroesophageal reflux disease)   . Hyperlipidemia   . Sleep apnea     CPAP  . Hernia, hiatal   . Heart palpitations    Past Surgical History  Procedure Laterality Date  . Appendectomy  2004  . Cholecystectomy    . Retinal detachment surgery    . Cataract extraction w/ intraocular lens implant      both eyes   History   Social History  . Marital Status: Married    Spouse Name: N/A    Number of Children: 2  . Years of Education: N/A   Occupational History  .  Other   Social History Main Topics  . Smoking status: Former Research scientist (life sciences)  . Smokeless tobacco: None     Comment: QUIT IN 1990  . Alcohol Use: No  . Drug Use: None  . Sexual Activity: Yes   Other Topics Concern  . None   Social History Narrative   Two grandchildren.            Family History  Problem Relation Age of Onset  . Cancer Father 80  . Hypertension Mother   . CVA Mother    Allergies  Allergen Reactions  . Codeine   . Kenalog [Triamcinolone Acetonide]   . Penicillins   . Relafen [Nabumetone]     Edema   . Statins     Possible myositis   . Tramadol     Halllucinations   Prior to Admission medications   Medication Sig Start Date End Date Taking? Authorizing Provider  ASPIRIN PO Take 81 mg by mouth.    Yes  Historical Provider, MD  diltiazem (CARDIZEM CD) 240 MG 24 hr capsule Take 1 capsule (240 mg total) by mouth daily. 05/28/14  Yes Minus Breeding, MD  furosemide (LASIX) 40 MG tablet TAKE 1 TABLET BY MOUTH DAILY 02/02/14  Yes Lanice Shirts, MD  NON FORMULARY CPAP THERAPY   Yes Historical Provider, MD  potassium chloride SA (K-DUR,KLOR-CON) 20 MEQ tablet Take 1 tablet (20 mEq total) by mouth daily. 02/02/14  Yes Lanice Shirts, MD  ranitidine (ZANTAC) 150 MG tablet Take 150 mg by mouth 2 (two) times daily.   Yes Historical Provider, MD  TOPROL XL 50 MG 24 hr tablet Take a 1 tab in am and 1/2 of 50 mg in pm 05/28/14  Yes Minus Breeding, MD     ROS: The patient denies fevers, chills, night sweats, unintentional weight loss, chest pain, palpitations, wheezing, dyspnea on exertion, nausea, vomiting, abdominal pain, dysuria, hematuria, melena, numbness, weakness, or tingling.   All other systems have been reviewed and were otherwise negative with the exception of those mentioned  in the HPI and as above.    PHYSICAL EXAM: Filed Vitals:   07/03/14 1055  BP: 130/70  Pulse: 57  Temp: 98.4 F (36.9 C)  Resp: 16   Filed Vitals:   07/03/14 1055  Height: 5' 8"  (1.727 m)  Weight: 278 lb (126.1 kg)   Body mass index is 42.28 kg/(m^2).  General: Alert, no acute distress HEENT:  Normocephalic, atraumatic, oropharynx patent. EOMI, PERRLA Cardiovascular:  Regular rate and rhythm, no rubs murmurs or gallops.  No Carotid bruits, radial pulse intact. No pedal edema.  Respiratory: Clear to auscultation bilaterally.  No wheezes, rales, or rhonchi.  No cyanosis, no use of accessory musculature GI: No organomegaly, abdomen is soft and non-tender, positive bowel sounds.  No masses. Skin: No e/o infection of area on toes she is concerned about, there looks to be minimal brown spot/discoloration ? Dried blister or broken vessels. IT is nontender at this time, no erythema, no dc, no pus.  She does have  significant onychomycosis bialterally  Neurologic: Facial musculature symmetric. Psychiatric: Patient is appropriate throughout our interaction. Lymphatic: No cervical lymphadenopathy Musculoskeletal: Gait intact.   LABS: Results for orders placed in visit on 04/20/14  COMPREHENSIVE METABOLIC PANEL      Result Value Ref Range   Sodium 143  135 - 145 mEq/L   Potassium 4.8  3.5 - 5.3 mEq/L   Chloride 103  96 - 112 mEq/L   CO2 23  19 - 32 mEq/L   Glucose, Bld 104 (*) 70 - 99 mg/dL   BUN 17  6 - 23 mg/dL   Creat 0.89  0.50 - 1.10 mg/dL   Total Bilirubin 0.4  0.2 - 1.2 mg/dL   Alkaline Phosphatase 102  39 - 117 U/L   AST 16  0 - 37 U/L   ALT 17  0 - 35 U/L   Total Protein 7.1  6.0 - 8.3 g/dL   Albumin 4.3  3.5 - 5.2 g/dL   Calcium 9.4  8.4 - 10.5 mg/dL  CBC WITH DIFFERENTIAL      Result Value Ref Range   WBC 7.0  4.0 - 10.5 K/uL   RBC 5.11  3.87 - 5.11 MIL/uL   Hemoglobin 15.2 (*) 12.0 - 15.0 g/dL   HCT 43.6  36.0 - 46.0 %   MCV 85.3  78.0 - 100.0 fL   MCH 29.7  26.0 - 34.0 pg   MCHC 34.9  30.0 - 36.0 g/dL   RDW 13.8  11.5 - 15.5 %   Platelets 270  150 - 400 K/uL   Neutrophils Relative % 62  43 - 77 %   Neutro Abs 4.3  1.7 - 7.7 K/uL   Lymphocytes Relative 28  12 - 46 %   Lymphs Abs 2.0  0.7 - 4.0 K/uL   Monocytes Relative 8  3 - 12 %   Monocytes Absolute 0.6  0.1 - 1.0 K/uL   Eosinophils Relative 2  0 - 5 %   Eosinophils Absolute 0.1  0.0 - 0.7 K/uL   Basophils Relative 0  0 - 1 %   Basophils Absolute 0.0  0.0 - 0.1 K/uL   Smear Review Criteria for review not met    TSH      Result Value Ref Range   TSH 2.553  0.350 - 4.500 uIU/mL  LIPID PANEL      Result Value Ref Range   Cholesterol 262 (*) 0 - 200 mg/dL   Triglycerides 227 (*) <150  mg/dL   HDL 36 (*) >39 mg/dL   Total CHOL/HDL Ratio 7.3     VLDL 45 (*) 0 - 40 mg/dL   LDL Cholesterol 181 (*) 0 - 99 mg/dL  POCT URINALYSIS DIPSTICK      Result Value Ref Range   Color, UA yellow     Clarity, UA clear      Glucose, UA neg     Bilirubin, UA neg     Ketones, UA neg     Spec Grav, UA 1.015     Blood, UA neg     pH, UA 6.5     Protein, UA neg     Urobilinogen, UA negative     Nitrite, UA neg     Leukocytes, UA small (1+)       EKG/XRAY:   Primary read interpreted by Dr. Marin Comment at [facility].   ASSESSMENT/PLAN: Encounter Diagnosis  Name Primary?  . Cellulitis of toe of left foot Yes   I am not sure if this is just a hyperpigmented blister that burst and now has dried or if this is true infection ie cellulitis  She si very worried about it getting infected, will due topical abx for now since very localized and on exam does not appear to be infected to me, will go with most conservative treatment in the event it could be infected Rx bactroban F/u prn  Gross sideeffects, risk and benefits, and alternatives of medications d/w patient. Patient is aware that all medications have potential sideeffects and we are unable to predict every sideeffect or drug-drug interaction that may occur.  Fate Galanti, [place], DO 07/03/2014 12:12 PM

## 2014-07-08 ENCOUNTER — Other Ambulatory Visit: Payer: Self-pay | Admitting: Orthopedic Surgery

## 2014-07-08 NOTE — Progress Notes (Signed)
Preoperative surgical orders have been place into the [facility] hospital system for Victoria Delgado on 07/08/2014, 9:46 AM  by Patrica DuelPERKINS, ALEXZANDREW for surgery on 08/04/2014.  Preop Total Hip - Anterior Approach orders including Experel Injecion, IV Tylenol, and IV Decadron as long as there are no contraindications to the above medications. Avel Peacerew Perkins, PA-C.

## 2014-07-26 ENCOUNTER — Encounter (HOSPITAL_COMMUNITY): Payer: Self-pay | Admitting: Pharmacist

## 2014-07-26 ENCOUNTER — Other Ambulatory Visit: Payer: Self-pay | Admitting: Internal Medicine

## 2014-07-26 NOTE — Telephone Encounter (Signed)
Requested Medications     Medication name:  Name from pharmacy:  furosemide (LASIX) 40 MG tablet  FUROSEMIDE  TABLETS    The source prescription has been discontinued.    Sig: TAKE 1 TABLET BY MOUTH EVERY DAY    Dispense: 90 tablet Refills: 0 Start: 07/26/2014  Class: Normal    Requested on: 07/26/2014    Originally ordered on: 09/07/2013 Last refill: 04/30/2014 Order History and Details

## 2014-07-27 ENCOUNTER — Other Ambulatory Visit: Payer: Self-pay | Admitting: Surgical

## 2014-07-27 NOTE — H&P (Signed)
TOTAL HIP ADMISSION H&P  Patient is admitted for right total hip arthroplasty.  Subjective:  Chief Complaint: right hip pain  HPI: Victoria Delgado, 73 y.o. female, has a history of pain and functional disability in the right hip(s) due to arthritis and patient has failed non-surgical conservative treatments for greater than 12 weeks to include NSAID's and/or analgesics, flexibility and strengthening excercises, use of assistive devices, weight reduction as appropriate and activity modification.  Onset of symptoms was gradual starting 5 years ago with gradually worsening course since that time.The patient noted no past surgery on the right hip(s).  Patient currently rates pain in the right hip at 7 out of 10 with activity. Patient has night pain, worsening of pain with activity and weight bearing, pain that interfers with activities of daily living, pain with passive range of motion and crepitus. Patient has evidence of periarticular osteophytes and joint space narrowing by imaging studies.  There is no current active infection.  Patient Active Problem List   Diagnosis Date Noted  . Edema 08/17/2013  . H/O bone density study 03/16/2013  . 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 Unspecified hereditary and idiopathic peripheral neuropathy 03/05/2013  . Unspecified vitamin D deficiency 03/05/2013  . Fatty infiltration of liver 02/08/2013  . Other and unspecified hyperlipidemia 02/08/2013  . Morbid obesity 02/08/2013  . Cellulitis of groin, right 12/30/2012  . DJD (degenerative joint disease) 12/17/2012  . GERD (gastroesophageal reflux disease) 12/17/2012  . Peripheral neuropathy 12/17/2012  . HOCM (hypertrophic obstructive cardiomyopathy) 10/16/2012  . HTN (hypertension) 10/16/2012  . Sleep apnea 10/16/2012  . Heart palpitations    Past Medical History  Diagnosis Date  . Hepatitis   . Cataract   . Hypertension   . GERD (gastroesophageal reflux disease)   . Hyperlipidemia   . Sleep apnea      CPAP  . Hernia, hiatal   . Heart palpitations     Past Surgical History  Procedure Laterality Date  . Appendectomy  2004  . Cholecystectomy    . Retinal detachment surgery    . Cataract extraction w/ intraocular lens implant      both eyes   Current outpatient prescriptions: ASPIRIN PO, Take 81 mg by mouth. , Disp: , Rfl: ;   diltiazem (CARDIZEM CD) 240 MG 24 hr capsule, Take 240 mg by mouth at bedtime., Disp: , Rfl: ;   furosemide (LASIX) 40 MG tablet, TAKE 1 TABLET BY MOUTH EVERY DAY, Disp: 90 tablet, Rfl: 0;   furosemide (LASIX) 40 MG tablet, Take 40 mg by mouth daily., Disp: , Rfl:  loteprednol (LOTEMAX) 0.2 % SUSP, Place 1 drop into the left eye daily as needed (inflammation)., Disp: , Rfl: ;   metoprolol succinate (TOPROL-XL) 50 MG 24 hr tablet, Take 25-50 mg by mouth 2 (two) times daily. Generic doesn't work. Takes  in am,  in pm., Disp: , Rfl: ;  NON FORMULARY, CPAP THERAPY, Disp: , Rfl: ;   potassium chloride (K-DUR) 10 MEQ tablet, Take 10 mEq by mouth daily., Disp: , Rfl:  ranitidine (ZANTAC) 150 MG tablet, Take 150 mg by mouth 2 (two) times daily., Disp: , Rfl:   Allergies  Allergen Reactions  . Codeine Nausea Only  . Kenalog [Triamcinolone Acetonide]     Had A.fib after she received the injection.  . Relafen [Nabumetone]     Edema   . Statins     Possible myositis   . Tramadol     Auditory halllucinations  . Penicillins Rash  Years ago, injection site was red and swollen.    History  Substance Use Topics  . Smoking status: Former Games developer  . Smokeless tobacco: Not on file     Comment: QUIT IN 1990  . Alcohol Use: No    Family History  Problem Relation Age of Onset  . Cancer Father 51  . Hypertension Mother   . CVA Mother      Review of Systems  Unable to perform ROS Constitutional: Negative.   HENT: Negative.   Eyes: Negative.   Respiratory: Positive for shortness of breath. Negative for cough, hemoptysis, sputum production and  wheezing.        SOB on exertion  Cardiovascular: Positive for palpitations. Negative for chest pain, orthopnea, claudication, leg swelling and PND.  Gastrointestinal: Positive for heartburn.  Genitourinary: Negative.   Musculoskeletal: Positive for back pain and joint pain. Negative for falls, myalgias and neck pain.       Right hip pain  Skin: Negative.   Neurological: Negative.   Endo/Heme/Allergies: Negative.   Psychiatric/Behavioral: Negative.     Objective:  Physical Exam  Constitutional: She appears well-developed. No distress.  Morbidly obese  HENT:  Head: Normocephalic and atraumatic.  Right Ear: External ear normal.  Left Ear: External ear normal.  Nose: Nose normal.  Mouth/Throat: Oropharynx is clear and moist.  Eyes: Conjunctivae and EOM are normal.  Neck: Normal range of motion. Neck supple.  Cardiovascular: Normal rate and intact distal pulses.  An irregularly irregular rhythm present.  Murmur heard.  Systolic murmur is present with a grade of 3/6  Respiratory: Effort normal and breath sounds normal. No respiratory distress. She has no wheezes.  GI: Soft. Bowel sounds are normal. She exhibits no distension. There is no tenderness.  Musculoskeletal:       Right hip: She exhibits decreased range of motion and decreased strength.       Left hip: She exhibits decreased range of motion. She exhibits normal strength.       Right knee: Normal.       Left knee: Normal.       Right lower leg: She exhibits no tenderness and no swelling.       Left lower leg: She exhibits no tenderness and no swelling.   Her right hip shows flexion to 90 degrees, no internal rotation, about 10-20 degrees of external rotation, and 20 degrees of abduction. Left hip with flexion to 110 degrees, rotation in 10 degrees, rotation out 30 degrees, and abduction to 30 degrees with discomfort. She is tender in the lower lumbar paraspinals, but no specific bony tenderness in her back. The knee exams  show crepitus on range of motion, but no effusion or instability. She has a significantly antalgic gait pattern.  Neurological: She has normal strength. No sensory deficit.  Skin: She is not diaphoretic.   Vitals  Weight: 270 lb Height: 68in Body Surface Area: 2.42 m Body Mass Index: 41.05 kg/m Pulse: 72 (Regular)  BP: 136/78 (Sitting, Left Arm, Standard)   Imaging Review Plain radiographs demonstrate severe degenerative joint disease of the right hip(s). The bone quality appears to be good for age and reported activity level.  Assessment/Plan:  End stage arthritis, right hip(s)  The patient history, physical examination, clinical judgement of the provider and imaging studies are consistent with end stage degenerative joint disease of the right hip(s) and total hip arthroplasty is deemed medically necessary. The treatment options including medical management, injection therapy, arthroscopy and arthroplasty were discussed at  length. The risks and benefits of total hip arthroplasty were presented and reviewed. The risks due to aseptic loosening, infection, stiffness, dislocation/subluxation,  thromboembolic complications and other imponderables were discussed.  The patient acknowledged the explanation, agreed to proceed with the plan and consent was signed. Patient is being admitted for inpatient treatment for surgery, pain control, PT, OT, prophylactic antibiotics, VTE prophylaxis, progressive ambulation and ADL's and discharge planning.The patient is planning to be discharged home with home health services   PCP: Dr. Constance Goltz Cardio: Dr. Wyvonne Lenz, PA-C

## 2014-07-28 ENCOUNTER — Encounter (HOSPITAL_COMMUNITY)
Admission: RE | Admit: 2014-07-28 | Discharge: 2014-07-28 | Disposition: A | Discharge status: Home / Self Care | Payer: Medicare Other | Source: Ambulatory Visit | Attending: Orthopedic Surgery | Admitting: Orthopedic Surgery

## 2014-07-28 ENCOUNTER — Ambulatory Visit (HOSPITAL_COMMUNITY)
Admission: RE | Admit: 2014-07-28 | Discharge: 2014-07-28 | Disposition: A | Discharge status: Home / Self Care | Payer: Medicare Other | Source: Ambulatory Visit | Attending: Anesthesiology | Admitting: Anesthesiology

## 2014-07-28 ENCOUNTER — Encounter (HOSPITAL_COMMUNITY): Payer: Self-pay

## 2014-07-28 ENCOUNTER — Ambulatory Visit (HOSPITAL_COMMUNITY)
Admission: RE | Admit: 2014-07-28 | Discharge: 2014-07-28 | Disposition: A | Discharge status: Home / Self Care | Payer: Medicare Other | Source: Ambulatory Visit | Attending: Orthopedic Surgery | Admitting: Orthopedic Surgery

## 2014-07-28 DIAGNOSIS — Z01818 Encounter for other preprocedural examination: Secondary | ICD-10-CM | POA: Diagnosis present

## 2014-07-28 DIAGNOSIS — I10 Essential (primary) hypertension: Secondary | ICD-10-CM | POA: Diagnosis not present

## 2014-07-28 HISTORY — DX: Unspecified osteoarthritis, unspecified site: M19.90

## 2014-07-28 LAB — URINALYSIS, ROUTINE W REFLEX MICROSCOPIC
Bilirubin Urine: NEGATIVE
Glucose, UA: NEGATIVE mg/dL
HGB URINE DIPSTICK: NEGATIVE
Ketones, ur: NEGATIVE mg/dL
Leukocytes, UA: NEGATIVE
NITRITE: NEGATIVE
PROTEIN: NEGATIVE mg/dL
SPECIFIC GRAVITY, URINE: 1.007 (ref 1.005–1.030)
Urobilinogen, UA: 0.2 mg/dL (ref 0.0–1.0)
pH: 6 (ref 5.0–8.0)

## 2014-07-28 LAB — PROTIME-INR
INR: 0.99 (ref 0.00–1.49)
Prothrombin Time: 13.1 seconds (ref 11.6–15.2)

## 2014-07-28 LAB — COMPREHENSIVE METABOLIC PANEL
ALK PHOS: 99 U/L (ref 39–117)
ALT: 16 U/L (ref 0–35)
ANION GAP: 15 (ref 5–15)
AST: 16 U/L (ref 0–37)
Albumin: 4 g/dL (ref 3.5–5.2)
BILIRUBIN TOTAL: 0.3 mg/dL (ref 0.3–1.2)
BUN: 14 mg/dL (ref 6–23)
CHLORIDE: 101 meq/L (ref 96–112)
CO2: 27 mEq/L (ref 19–32)
Calcium: 10.3 mg/dL (ref 8.4–10.5)
Creatinine, Ser: 0.97 mg/dL (ref 0.50–1.10)
GFR calc Af Amer: 67 mL/min — ABNORMAL LOW (ref >90)
GFR calc non Af Amer: 58 mL/min — ABNORMAL LOW (ref >90)
Glucose, Bld: 122 mg/dL — ABNORMAL HIGH (ref 70–99)
POTASSIUM: 4.5 meq/L (ref 3.7–5.3)
SODIUM: 143 meq/L (ref 137–147)
TOTAL PROTEIN: 7.6 g/dL (ref 6.0–8.3)

## 2014-07-28 LAB — SURGICAL PCR SCREEN
MRSA, PCR: NEGATIVE
STAPHYLOCOCCUS AUREUS: NEGATIVE

## 2014-07-28 LAB — CBC
HEMATOCRIT: 43 % (ref 36.0–46.0)
Hemoglobin: 14.7 g/dL (ref 12.0–15.0)
MCH: 29.9 pg (ref 26.0–34.0)
MCHC: 34.2 g/dL (ref 30.0–36.0)
MCV: 87.6 fL (ref 78.0–100.0)
Platelets: 250 10*3/uL (ref 150–400)
RBC: 4.91 MIL/uL (ref 3.87–5.11)
RDW: 13.1 % (ref 11.5–15.5)
WBC: 6.4 10*3/uL (ref 4.0–10.5)

## 2014-07-28 LAB — ABO/RH: ABO/RH(D): A POS

## 2014-07-28 LAB — APTT: APTT: 32 s (ref 24–37)

## 2014-07-28 NOTE — Pre-Procedure Instructions (Addendum)
07-28-14 EKG 5'15, Echo 6'15  Epic. CXR done today. Rt. Hip Xray done today.

## 2014-07-28 NOTE — Patient Instructions (Signed)
20 KYM SCANNELL  07/28/2014   Your procedure is scheduled on 9-2   -2015 Wednesday  Enter through [facility] Entrance and follow signs to [facility]. Arrive at  0730      AM   Call this number if you have problems the morning of surgery: [phone]  Or Presurgical Testing [phone].   For Living Will and/or Health Care Power Attorney Forms: please provide copy for your medical record,may bring AM of surgery(Forms should be already notarized -we do not provide this service).(07-28-14 No information preferred today).   For Cpap use: Bring mask and tubing only.   Do not eat food/ or drink: After Midnight.      Take these medicines the morning of surgery with A SIP OF WATER: Metoprolol. Ranitidine.    Do not wear jewelry, make-up or nail polish.  Do not wear lotions, powders, or perfumes. You may wear deodorant.  Do not shave 48 hours(2 days) prior to first CHG shower(legs and under arms).(Shaving face and neck okay.)  Do not bring valuables to the hospital.(Hospital is not responsible for lost valuables).  Contacts, dentures or removable bridgework, body piercing, hair pins may not be worn into surgery.  Leave suitcase in the car. After surgery it may be brought to your room.  For patients admitted to the hospital, checkout time is 11:00 AM the day of discharge.(Restricted visitors-Any Persons displaying flu-like symptoms or illness).    Patients discharged the day of surgery will not be allowed to drive home. Must have responsible person with you x 24 hours once discharged.  Name and phone number of your driver: Peyton Najjar, spouse -[phone] h Special Instructions: CHG(Chlorhedine 4%-"Hibiclens","Betasept","Aplicare") Shower Use Special Wash: see special instructions.(avoid face and genitals)   Please read over the following fact sheets that you were given: MRSA Information, Blood Transfusion fact sheet, Incentive Spirometry Instruction.  Remember : Type/Screen "Blue  armbands" - may not be removed once applied(would result in being retested AM of surgery, if removed).     _____________________    [facility] - Preparing for Surgery Before surgery, you can play an important role.  Because skin is not sterile, your skin needs to be as free of germs as possible.  You can reduce the number of germs on your skin by washing with CHG (chlorahexidine gluconate) soap before surgery.  CHG is an antiseptic cleaner which kills germs and bonds with the skin to continue killing germs even after washing. Please DO NOT use if you have an allergy to CHG or antibacterial soaps.  If your skin becomes reddened/irritated stop using the CHG and inform your nurse when you arrive at Short Stay. Do not shave (including legs and underarms) for at least 48 hours prior to the first CHG shower.  You may shave your face/neck. Please follow these instructions carefully:  1.  Shower with CHG Soap the night before surgery and the  morning of Surgery.  2.  If you choose to wash your hair, wash your hair first as usual with your  normal  shampoo.  3.  After you shampoo, rinse your hair and body thoroughly to remove the  shampoo.                           4.  Use CHG as you would any other liquid soap.  You can apply chg directly  to the skin and wash  Gently with a scrungie or clean washcloth.  5.  Apply the CHG Soap to your body ONLY FROM THE NECK DOWN.   Do not use on face/ open                           Wound or open sores. Avoid contact with eyes, ears mouth and genitals (private parts).                       Wash face,  Genitals (private parts) with your normal soap.             6.  Wash thoroughly, paying special attention to the area where your surgery  will be performed.  7.  Thoroughly rinse your body with warm water from the neck down.  8.  DO NOT shower/wash with your normal soap after using and rinsing off  the CHG Soap.                9.  Pat yourself dry with  a clean towel.            10.  Wear clean pajamas.            11.  Place clean sheets on your bed the night of your first shower and do not  sleep with pets. Day of Surgery : Do not apply any lotions/deodorants the morning of surgery.  Please wear clean clothes to the hospital/surgery center.  FAILURE TO FOLLOW THESE INSTRUCTIONS MAY RESULT IN THE CANCELLATION OF YOUR SURGERY PATIENT SIGNATURE_________________________________  NURSE SIGNATURE__________________________________  ________________________________________________________________________   Rogelia Mire  An incentive spirometer is a tool that can help keep your lungs clear and active. This tool measures how well you are filling your lungs with each breath. Taking long deep breaths may help reverse or decrease the chance of developing breathing (pulmonary) problems (especially infection) following:  A long period of time when you are unable to move or be active. BEFORE THE PROCEDURE   If the spirometer includes an indicator to show your best effort, your nurse or respiratory therapist will set it to a desired goal.  If possible, sit up straight or lean slightly forward. Try not to slouch.  Hold the incentive spirometer in an upright position. INSTRUCTIONS FOR USE  1. Sit on the edge of your bed if possible, or sit up as far as you can in bed or on a chair. 2. Hold the incentive spirometer in an upright position. 3. Breathe out normally. 4. Place the mouthpiece in your mouth and seal your lips tightly around it. 5. Breathe in slowly and as deeply as possible, raising the piston or the ball toward the top of the column. 6. Hold your breath for 3-5 seconds or for as long as possible. Allow the piston or ball to fall to the bottom of the column. 7. Remove the mouthpiece from your mouth and breathe out normally. 8. Rest for a few seconds and repeat Steps 1 through 7 at least 10 times every 1-2 hours when you are awake.  Take your time and take a few normal breaths between deep breaths. 9. The spirometer may include an indicator to show your best effort. Use the indicator as a goal to work toward during each repetition. 10. After each set of 10 deep breaths, practice coughing to be sure your lungs are clear. If you have an incision (the cut made at the time of  surgery), support your incision when coughing by placing a pillow or rolled up towels firmly against it. Once you are able to get out of bed, walk around indoors and cough well. You may stop using the incentive spirometer when instructed by your caregiver.  RISKS AND COMPLICATIONS  Take your time so you do not get dizzy or light-headed.  If you are in pain, you may need to take or ask for pain medication before doing incentive spirometry. It is harder to take a deep breath if you are having pain. AFTER USE  Rest and breathe slowly and easily.  It can be helpful to keep track of a log of your progress. Your caregiver can provide you with a simple table to help with this. If you are using the spirometer at home, follow these instructions: SEEK MEDICAL CARE IF:   You are having difficultly using the spirometer.  You have trouble using the spirometer as often as instructed.  Your pain medication is not giving enough relief while using the spirometer.  You develop fever of 100.5 F (38.1 C) or higher. SEEK IMMEDIATE MEDICAL CARE IF:   You cough up bloody sputum that had not been present before.  You develop fever of 102 F (38.9 C) or greater.  You develop worsening pain at or near the incision site. MAKE SURE YOU:   Understand these instructions.  Will watch your condition.  Will get help right away if you are not doing well or get worse. Document Released: 04/01/2007 Document Revised: 02/11/2012 Document Reviewed: 06/02/2007 ExitCare Patient Information 2014 ExitCare,  [place].   ________________________________________________________________________  WHAT IS A BLOOD TRANSFUSION? Blood Transfusion Information  A transfusion is the replacement of blood or some of its parts. Blood is made up of multiple cells which provide different functions.  Red blood cells carry oxygen and are used for blood loss replacement.  White blood cells fight against infection.  Platelets control bleeding.  Plasma helps clot blood.  Other blood products are available for specialized needs, such as hemophilia or other clotting disorders. BEFORE THE TRANSFUSION  Who gives blood for transfusions?   Healthy volunteers who are fully evaluated to make sure their blood is safe. This is blood bank blood. Transfusion therapy is the safest it has ever been in the practice of medicine. Before blood is taken from a donor, a complete history is taken to make sure that person has no history of diseases nor engages in risky social behavior (examples are intravenous drug use or sexual activity with multiple partners). The donor's travel history is screened to minimize risk of transmitting infections, such as malaria. The donated blood is tested for signs of infectious diseases, such as HIV and hepatitis. The blood is then tested to be sure it is compatible with you in order to minimize the chance of a transfusion reaction. If you or a relative donates blood, this is often done in anticipation of surgery and is not appropriate for emergency situations. It takes many days to process the donated blood. RISKS AND COMPLICATIONS Although transfusion therapy is very safe and saves many lives, the main dangers of transfusion include:   Getting an infectious disease.  Developing a transfusion reaction. This is an allergic reaction to something in the blood you were given. Every precaution is taken to prevent this. The decision to have a blood transfusion has been considered carefully by your caregiver  before blood is given. Blood is not given unless the benefits outweigh the risks. AFTER THE  TRANSFUSION  Right after receiving a blood transfusion, you will usually feel much better and more energetic. This is especially true if your red blood cells have gotten low (anemic). The transfusion raises the level of the red blood cells which carry oxygen, and this usually causes an energy increase.  The nurse administering the transfusion will monitor you carefully for complications. HOME CARE INSTRUCTIONS  No special instructions are needed after a transfusion. You may find your energy is better. Speak with your caregiver about any limitations on activity for underlying diseases you may have. SEEK MEDICAL CARE IF:   Your condition is not improving after your transfusion.  You develop redness or irritation at the intravenous (IV) site. SEEK IMMEDIATE MEDICAL CARE IF:  Any of the following symptoms occur over the next 12 hours:  Shaking chills.  You have a temperature by mouth above 102 F (38.9 C), not controlled by medicine.  Chest, back, or muscle pain.  People around you feel you are not acting correctly or are confused.  Shortness of breath or difficulty breathing.  Dizziness and fainting.  You get a rash or develop hives.  You have a decrease in urine output.  Your urine turns a dark color or changes to pink, red, or brown. Any of the following symptoms occur over the next 10 days:  You have a temperature by mouth above 102 F (38.9 C), not controlled by medicine.  Shortness of breath.  Weakness after normal activity.  The white part of the eye turns yellow (jaundice).  You have a decrease in the amount of urine or are urinating less often.  Your urine turns a dark color or changes to pink, red, or brown. Document Released: 11/16/2000 Document Revised: 02/11/2012 Document Reviewed: 07/05/2008 [facility] Patient Information 2014 [place],  [place].  _______________________________________________________________________

## 2014-08-03 MED ORDER — VANCOMYCIN HCL 10 G IV SOLR
1500.0000 mg | INTRAVENOUS | Status: AC
  Administered 2014-08-04: 1500 mg via INTRAVENOUS
  Filled 2014-08-03: qty 1500

## 2014-08-04 ENCOUNTER — Inpatient Hospital Stay (HOSPITAL_COMMUNITY)
Admission: RE | Admit: 2014-08-04 | Discharge: 2014-08-06 | DRG: 470 | Disposition: A | Discharge status: Home Health | Payer: Medicare Other | Source: Ambulatory Visit | Attending: Orthopedic Surgery | Admitting: Orthopedic Surgery

## 2014-08-04 ENCOUNTER — Encounter (HOSPITAL_COMMUNITY)
Admission: RE | Disposition: A | Discharge status: Home Health | Payer: Self-pay | Source: Ambulatory Visit | Attending: Orthopedic Surgery

## 2014-08-04 ENCOUNTER — Inpatient Hospital Stay (HOSPITAL_COMMUNITY): Payer: Medicare Other | Admitting: Anesthesiology

## 2014-08-04 ENCOUNTER — Inpatient Hospital Stay (HOSPITAL_COMMUNITY): Payer: Medicare Other

## 2014-08-04 ENCOUNTER — Encounter (HOSPITAL_COMMUNITY): Payer: Medicare Other | Admitting: Anesthesiology

## 2014-08-04 ENCOUNTER — Encounter (HOSPITAL_COMMUNITY): Payer: Self-pay

## 2014-08-04 DIAGNOSIS — IMO0001 Reserved for inherently not codable concepts without codable children: Secondary | ICD-10-CM | POA: Diagnosis present

## 2014-08-04 DIAGNOSIS — M169 Osteoarthritis of hip, unspecified: Secondary | ICD-10-CM | POA: Diagnosis present

## 2014-08-04 DIAGNOSIS — Z880 Allergy status to penicillin: Secondary | ICD-10-CM | POA: Diagnosis not present

## 2014-08-04 DIAGNOSIS — Z87891 Personal history of nicotine dependence: Secondary | ICD-10-CM | POA: Diagnosis not present

## 2014-08-04 DIAGNOSIS — E559 Vitamin D deficiency, unspecified: Secondary | ICD-10-CM | POA: Diagnosis present

## 2014-08-04 DIAGNOSIS — G608 Other hereditary and idiopathic neuropathies: Secondary | ICD-10-CM | POA: Diagnosis present

## 2014-08-04 DIAGNOSIS — Z79899 Other long term (current) drug therapy: Secondary | ICD-10-CM

## 2014-08-04 DIAGNOSIS — E785 Hyperlipidemia, unspecified: Secondary | ICD-10-CM | POA: Diagnosis present

## 2014-08-04 DIAGNOSIS — Z6841 Body Mass Index (BMI) 40.0 and over, adult: Secondary | ICD-10-CM | POA: Diagnosis not present

## 2014-08-04 DIAGNOSIS — G4730 Sleep apnea, unspecified: Secondary | ICD-10-CM | POA: Diagnosis present

## 2014-08-04 DIAGNOSIS — M1611 Unilateral primary osteoarthritis, right hip: Secondary | ICD-10-CM

## 2014-08-04 DIAGNOSIS — Z7982 Long term (current) use of aspirin: Secondary | ICD-10-CM | POA: Diagnosis not present

## 2014-08-04 DIAGNOSIS — Z8249 Family history of ischemic heart disease and other diseases of the circulatory system: Secondary | ICD-10-CM

## 2014-08-04 DIAGNOSIS — Z885 Allergy status to narcotic agent status: Secondary | ICD-10-CM | POA: Diagnosis not present

## 2014-08-04 DIAGNOSIS — Z9849 Cataract extraction status, unspecified eye: Secondary | ICD-10-CM | POA: Diagnosis not present

## 2014-08-04 DIAGNOSIS — Z888 Allergy status to other drugs, medicaments and biological substances status: Secondary | ICD-10-CM | POA: Diagnosis not present

## 2014-08-04 DIAGNOSIS — Z809 Family history of malignant neoplasm, unspecified: Secondary | ICD-10-CM

## 2014-08-04 DIAGNOSIS — M76899 Other specified enthesopathies of unspecified lower limb, excluding foot: Secondary | ICD-10-CM | POA: Diagnosis present

## 2014-08-04 DIAGNOSIS — M25559 Pain in unspecified hip: Secondary | ICD-10-CM | POA: Diagnosis present

## 2014-08-04 DIAGNOSIS — I421 Obstructive hypertrophic cardiomyopathy: Secondary | ICD-10-CM | POA: Diagnosis present

## 2014-08-04 DIAGNOSIS — K219 Gastro-esophageal reflux disease without esophagitis: Secondary | ICD-10-CM | POA: Diagnosis present

## 2014-08-04 DIAGNOSIS — M1610 Unilateral primary osteoarthritis, unspecified hip: Secondary | ICD-10-CM | POA: Diagnosis present

## 2014-08-04 DIAGNOSIS — I10 Essential (primary) hypertension: Secondary | ICD-10-CM | POA: Diagnosis present

## 2014-08-04 DIAGNOSIS — Z823 Family history of stroke: Secondary | ICD-10-CM

## 2014-08-04 DIAGNOSIS — Z96641 Presence of right artificial hip joint: Secondary | ICD-10-CM

## 2014-08-04 DIAGNOSIS — K7689 Other specified diseases of liver: Secondary | ICD-10-CM | POA: Diagnosis present

## 2014-08-04 HISTORY — PX: TOTAL HIP ARTHROPLASTY: SHX124

## 2014-08-04 LAB — TYPE AND SCREEN
ABO/RH(D): A POS
Antibody Screen: NEGATIVE

## 2014-08-04 SURGERY — ARTHROPLASTY, HIP, TOTAL, ANTERIOR APPROACH
Anesthesia: General | Site: Hip | Laterality: Right

## 2014-08-04 MED ORDER — PHENOL 1.4 % MT LIQD: 1.0000 | OROMUCOSAL | Status: DC | PRN

## 2014-08-04 MED ORDER — BUPIVACAINE HCL (PF) 0.25 % IJ SOLN
INTRAMUSCULAR | Status: AC
  Filled 2014-08-04: qty 30

## 2014-08-04 MED ORDER — METHOCARBAMOL 500 MG PO TABS
500.0000 mg | ORAL_TABLET | Freq: Four times a day (QID) | ORAL | Status: DC | PRN
  Administered 2014-08-05: 500 mg via ORAL
  Filled 2014-08-04 (×3): qty 1

## 2014-08-04 MED ORDER — MIDAZOLAM HCL 5 MG/5ML IJ SOLN
INTRAMUSCULAR | Status: DC | PRN
  Administered 2014-08-04: 2 mg via INTRAVENOUS

## 2014-08-04 MED ORDER — SODIUM CHLORIDE 0.9 % IJ SOLN
INTRAMUSCULAR | Status: AC
  Filled 2014-08-04: qty 50

## 2014-08-04 MED ORDER — ACETAMINOPHEN 10 MG/ML IV SOLN
1000.0000 mg | Freq: Once | INTRAVENOUS | Status: AC
  Administered 2014-08-04: 1000 mg via INTRAVENOUS
  Filled 2014-08-04: qty 100

## 2014-08-04 MED ORDER — LACTATED RINGERS IV SOLN
INTRAVENOUS | Status: DC
  Administered 2014-08-04: 1000 mL via INTRAVENOUS
  Administered 2014-08-04: 12:00:00 via INTRAVENOUS

## 2014-08-04 MED ORDER — ROCURONIUM BROMIDE 100 MG/10ML IV SOLN
INTRAVENOUS | Status: DC | PRN
  Administered 2014-08-04: 10 mg via INTRAVENOUS
  Administered 2014-08-04: 5 mg via INTRAVENOUS
  Administered 2014-08-04: 35 mg via INTRAVENOUS

## 2014-08-04 MED ORDER — MENTHOL 3 MG MT LOZG: 1.0000 | LOZENGE | OROMUCOSAL | Status: DC | PRN

## 2014-08-04 MED ORDER — FENTANYL CITRATE 0.05 MG/ML IJ SOLN
INTRAMUSCULAR | Status: AC
  Filled 2014-08-04: qty 2

## 2014-08-04 MED ORDER — POLYETHYLENE GLYCOL 3350 17 G PO PACK: 17.0000 g | PACK | Freq: Every day | ORAL | Status: DC | PRN

## 2014-08-04 MED ORDER — MEPERIDINE HCL 50 MG/ML IJ SOLN: 6.2500 mg | INTRAMUSCULAR | Status: DC | PRN

## 2014-08-04 MED ORDER — GLYCOPYRROLATE 0.2 MG/ML IJ SOLN
INTRAMUSCULAR | Status: AC
  Filled 2014-08-04: qty 3

## 2014-08-04 MED ORDER — CEFAZOLIN SODIUM-DEXTROSE 2-3 GM-% IV SOLR
2.0000 g | Freq: Four times a day (QID) | INTRAVENOUS | Status: AC
  Administered 2014-08-04 (×2): 2 g via INTRAVENOUS
  Filled 2014-08-04 (×2): qty 50

## 2014-08-04 MED ORDER — POLYVINYL ALCOHOL 1.4 % OP SOLN
1.0000 [drp] | OPHTHALMIC | Status: DC | PRN
  Administered 2014-08-04: 1 [drp] via OPHTHALMIC
  Filled 2014-08-04: qty 15

## 2014-08-04 MED ORDER — DEXAMETHASONE SODIUM PHOSPHATE 10 MG/ML IJ SOLN
INTRAMUSCULAR | Status: AC
  Filled 2014-08-04: qty 1

## 2014-08-04 MED ORDER — MORPHINE SULFATE 2 MG/ML IJ SOLN: 1.0000 mg | INTRAMUSCULAR | Status: DC | PRN

## 2014-08-04 MED ORDER — PROPOFOL 10 MG/ML IV BOLUS
INTRAVENOUS | Status: DC | PRN
  Administered 2014-08-04: 180 mg via INTRAVENOUS

## 2014-08-04 MED ORDER — ONDANSETRON HCL 4 MG/2ML IJ SOLN: 4.0000 mg | Freq: Four times a day (QID) | INTRAMUSCULAR | Status: DC | PRN

## 2014-08-04 MED ORDER — NEOSTIGMINE METHYLSULFATE 10 MG/10ML IV SOLN
INTRAVENOUS | Status: AC
  Filled 2014-08-04: qty 1

## 2014-08-04 MED ORDER — ACETAMINOPHEN 500 MG PO TABS
1000.0000 mg | ORAL_TABLET | Freq: Four times a day (QID) | ORAL | Status: AC
  Administered 2014-08-04 – 2014-08-05 (×4): 1000 mg via ORAL
  Filled 2014-08-04 (×4): qty 2

## 2014-08-04 MED ORDER — ONDANSETRON HCL 4 MG PO TABS: 4.0000 mg | ORAL_TABLET | Freq: Four times a day (QID) | ORAL | Status: DC | PRN

## 2014-08-04 MED ORDER — METOPROLOL SUCCINATE ER 25 MG PO TB24
25.0000 mg | ORAL_TABLET | Freq: Every day | ORAL | Status: DC
  Filled 2014-08-04: qty 1

## 2014-08-04 MED ORDER — FAMOTIDINE 20 MG PO TABS
20.0000 mg | ORAL_TABLET | Freq: Two times a day (BID) | ORAL | Status: DC
  Administered 2014-08-04 – 2014-08-06 (×4): 20 mg via ORAL
  Filled 2014-08-04 (×5): qty 1

## 2014-08-04 MED ORDER — DIPHENHYDRAMINE HCL 12.5 MG/5ML PO ELIX: 12.5000 mg | ORAL_SOLUTION | ORAL | Status: DC | PRN

## 2014-08-04 MED ORDER — BISACODYL 10 MG RE SUPP: 10.0000 mg | Freq: Every day | RECTAL | Status: DC | PRN

## 2014-08-04 MED ORDER — TRANEXAMIC ACID 100 MG/ML IV SOLN
1000.0000 mg | INTRAVENOUS | Status: AC
  Administered 2014-08-04: 1000 mg via INTRAVENOUS
  Filled 2014-08-04: qty 10

## 2014-08-04 MED ORDER — FUROSEMIDE 40 MG PO TABS
40.0000 mg | ORAL_TABLET | ORAL | Status: DC
  Administered 2014-08-05: 40 mg via ORAL
  Filled 2014-08-04: qty 1

## 2014-08-04 MED ORDER — BUPIVACAINE LIPOSOME 1.3 % IJ SUSP
20.0000 mL | Freq: Once | INTRAMUSCULAR | Status: DC
  Filled 2014-08-04: qty 20

## 2014-08-04 MED ORDER — SUCCINYLCHOLINE CHLORIDE 20 MG/ML IJ SOLN
INTRAMUSCULAR | Status: DC | PRN
  Administered 2014-08-04: 140 mg via INTRAVENOUS

## 2014-08-04 MED ORDER — ONDANSETRON HCL 4 MG/2ML IJ SOLN
INTRAMUSCULAR | Status: DC | PRN
  Administered 2014-08-04: 4 mg via INTRAVENOUS

## 2014-08-04 MED ORDER — HYDROMORPHONE HCL PF 2 MG/ML IJ SOLN
INTRAMUSCULAR | Status: AC
  Filled 2014-08-04: qty 1

## 2014-08-04 MED ORDER — BUPIVACAINE HCL (PF) 0.25 % IJ SOLN
INTRAMUSCULAR | Status: DC | PRN
  Administered 2014-08-04: 30 mL

## 2014-08-04 MED ORDER — CHLORHEXIDINE GLUCONATE 4 % EX LIQD: 60.0000 mL | Freq: Once | CUTANEOUS | Status: DC

## 2014-08-04 MED ORDER — SODIUM CHLORIDE 0.9 % IJ SOLN
INTRAMUSCULAR | Status: DC | PRN
  Administered 2014-08-04: 30 mL via INTRAVENOUS

## 2014-08-04 MED ORDER — SODIUM CHLORIDE 0.9 % IV SOLN: INTRAVENOUS | Status: DC

## 2014-08-04 MED ORDER — 0.9 % SODIUM CHLORIDE (POUR BTL) OPTIME
TOPICAL | Status: DC | PRN
  Administered 2014-08-04: 1000 mL

## 2014-08-04 MED ORDER — HYPROMELLOSE (GONIOSCOPIC) 2.5 % OP SOLN: 1.0000 [drp] | OPHTHALMIC | Status: DC | PRN

## 2014-08-04 MED ORDER — GLYCOPYRROLATE 0.2 MG/ML IJ SOLN
INTRAMUSCULAR | Status: DC | PRN
  Administered 2014-08-04: 0.6 mg via INTRAVENOUS

## 2014-08-04 MED ORDER — METOPROLOL SUCCINATE ER 25 MG PO TB24
25.0000 mg | ORAL_TABLET | Freq: Two times a day (BID) | ORAL | Status: DC
  Filled 2014-08-04: qty 2

## 2014-08-04 MED ORDER — LIDOCAINE HCL (CARDIAC) 20 MG/ML IV SOLN
INTRAVENOUS | Status: AC
  Filled 2014-08-04: qty 5

## 2014-08-04 MED ORDER — DEXAMETHASONE SODIUM PHOSPHATE 10 MG/ML IJ SOLN
INTRAMUSCULAR | Status: DC | PRN
  Administered 2014-08-04: 10 mg via INTRAVENOUS

## 2014-08-04 MED ORDER — ONDANSETRON HCL 4 MG/2ML IJ SOLN
INTRAMUSCULAR | Status: AC
  Filled 2014-08-04: qty 2

## 2014-08-04 MED ORDER — ROCURONIUM BROMIDE 100 MG/10ML IV SOLN
INTRAVENOUS | Status: AC
  Filled 2014-08-04: qty 1

## 2014-08-04 MED ORDER — SODIUM CHLORIDE 0.45 % IV SOLN
INTRAVENOUS | Status: DC
  Administered 2014-08-04 – 2014-08-05 (×2): via INTRAVENOUS

## 2014-08-04 MED ORDER — PROPOFOL 10 MG/ML IV BOLUS
INTRAVENOUS | Status: AC
  Filled 2014-08-04: qty 20

## 2014-08-04 MED ORDER — ACETAMINOPHEN 325 MG PO TABS
650.0000 mg | ORAL_TABLET | Freq: Four times a day (QID) | ORAL | Status: DC | PRN
  Administered 2014-08-05 – 2014-08-06 (×2): 650 mg via ORAL
  Filled 2014-08-04 (×2): qty 2

## 2014-08-04 MED ORDER — METOPROLOL SUCCINATE ER 50 MG PO TB24
50.0000 mg | ORAL_TABLET | Freq: Every day | ORAL | Status: DC
  Administered 2014-08-04: 50 mg via ORAL
  Filled 2014-08-04 (×2): qty 1

## 2014-08-04 MED ORDER — FLEET ENEMA 7-19 GM/118ML RE ENEM: 1.0000 | ENEMA | Freq: Once | RECTAL | Status: AC | PRN

## 2014-08-04 MED ORDER — DILTIAZEM HCL ER COATED BEADS 240 MG PO CP24
240.0000 mg | ORAL_CAPSULE | Freq: Every day | ORAL | Status: DC
  Administered 2014-08-04 – 2014-08-05 (×2): 240 mg via ORAL
  Filled 2014-08-04 (×3): qty 1

## 2014-08-04 MED ORDER — ACETAMINOPHEN 650 MG RE SUPP: 650.0000 mg | Freq: Four times a day (QID) | RECTAL | Status: DC | PRN

## 2014-08-04 MED ORDER — METHOCARBAMOL 1000 MG/10ML IJ SOLN
500.0000 mg | Freq: Four times a day (QID) | INTRAVENOUS | Status: DC | PRN
  Filled 2014-08-04: qty 5

## 2014-08-04 MED ORDER — METOCLOPRAMIDE HCL 10 MG PO TABS: 5.0000 mg | ORAL_TABLET | Freq: Three times a day (TID) | ORAL | Status: DC | PRN

## 2014-08-04 MED ORDER — FENTANYL CITRATE 0.05 MG/ML IJ SOLN: 25.0000 ug | INTRAMUSCULAR | Status: DC | PRN

## 2014-08-04 MED ORDER — BUPIVACAINE LIPOSOME 1.3 % IJ SUSP
INTRAMUSCULAR | Status: DC | PRN
  Administered 2014-08-04: 20 mL

## 2014-08-04 MED ORDER — POTASSIUM CHLORIDE ER 10 MEQ PO TBCR
10.0000 meq | EXTENDED_RELEASE_TABLET | Freq: Every day | ORAL | Status: DC
  Administered 2014-08-04 – 2014-08-06 (×3): 10 meq via ORAL
  Filled 2014-08-04 (×3): qty 1

## 2014-08-04 MED ORDER — METOCLOPRAMIDE HCL 5 MG/ML IJ SOLN: 5.0000 mg | Freq: Three times a day (TID) | INTRAMUSCULAR | Status: DC | PRN

## 2014-08-04 MED ORDER — ENOXAPARIN SODIUM 40 MG/0.4ML ~~LOC~~ SOLN
40.0000 mg | SUBCUTANEOUS | Status: DC
  Administered 2014-08-05 – 2014-08-06 (×2): 40 mg via SUBCUTANEOUS
  Filled 2014-08-04 (×4): qty 0.4

## 2014-08-04 MED ORDER — MIDAZOLAM HCL 2 MG/2ML IJ SOLN
INTRAMUSCULAR | Status: AC
  Filled 2014-08-04: qty 2

## 2014-08-04 MED ORDER — HYDROMORPHONE HCL PF 1 MG/ML IJ SOLN
INTRAMUSCULAR | Status: DC | PRN
  Administered 2014-08-04: .4 mg via INTRAVENOUS
  Administered 2014-08-04 (×2): 0.5 mg via INTRAVENOUS
  Administered 2014-08-04: .6 mg via INTRAVENOUS

## 2014-08-04 MED ORDER — PROMETHAZINE HCL 25 MG/ML IJ SOLN: 6.2500 mg | INTRAMUSCULAR | Status: DC | PRN

## 2014-08-04 MED ORDER — FENTANYL CITRATE 0.05 MG/ML IJ SOLN
INTRAMUSCULAR | Status: DC | PRN
  Administered 2014-08-04 (×2): 50 ug via INTRAVENOUS

## 2014-08-04 MED ORDER — OXYCODONE HCL 5 MG PO TABS
5.0000 mg | ORAL_TABLET | ORAL | Status: DC | PRN
  Administered 2014-08-05 (×2): 5 mg via ORAL
  Filled 2014-08-04 (×2): qty 1

## 2014-08-04 MED ORDER — NEOSTIGMINE METHYLSULFATE 10 MG/10ML IV SOLN
INTRAVENOUS | Status: DC | PRN
  Administered 2014-08-04: 4 mg via INTRAVENOUS

## 2014-08-04 MED ORDER — DOCUSATE SODIUM 100 MG PO CAPS
100.0000 mg | ORAL_CAPSULE | Freq: Two times a day (BID) | ORAL | Status: DC
  Administered 2014-08-04 – 2014-08-06 (×4): 100 mg via ORAL

## 2014-08-04 SURGICAL SUPPLY — 39 items
BAG ZIPLOCK 12X15 (MISCELLANEOUS) IMPLANT
BLADE EXTENDED COATED 6.5IN (ELECTRODE) ×3 IMPLANT
BLADE SAG 18X100X1.27 (BLADE) ×3 IMPLANT
CAPT HIP PF MOP ×3 IMPLANT
CLOSURE WOUND 1/2 X4 (GAUZE/BANDAGES/DRESSINGS) ×1
COVER PERINEAL POST (MISCELLANEOUS) ×3 IMPLANT
DECANTER SPIKE VIAL GLASS SM (MISCELLANEOUS) ×3 IMPLANT
DRAPE C-ARM 42X120 X-RAY (DRAPES) ×3 IMPLANT
DRAPE STERI IOBAN 125X83 (DRAPES) ×3 IMPLANT
DRAPE U-SHAPE 47X51 STRL (DRAPES) ×9 IMPLANT
DRSG ADAPTIC 3X8 NADH LF (GAUZE/BANDAGES/DRESSINGS) ×3 IMPLANT
DRSG MEPILEX BORDER 4X4 (GAUZE/BANDAGES/DRESSINGS) ×3 IMPLANT
DRSG MEPILEX BORDER 4X8 (GAUZE/BANDAGES/DRESSINGS) ×3 IMPLANT
DURAPREP 26ML APPLICATOR (WOUND CARE) ×3 IMPLANT
ELECT REM PT RETURN 9FT ADLT (ELECTROSURGICAL) ×3
ELECTRODE REM PT RTRN 9FT ADLT (ELECTROSURGICAL) ×1 IMPLANT
EVACUATOR 1/8 PVC DRAIN (DRAIN) ×3 IMPLANT
FACESHIELD WRAPAROUND (MASK) ×12 IMPLANT
GAUZE SPONGE 4X4 12PLY STRL (GAUZE/BANDAGES/DRESSINGS) IMPLANT
GLOVE BIO SURGEON STRL SZ7.5 (GLOVE) ×3 IMPLANT
GLOVE BIO SURGEON STRL SZ8 (GLOVE) ×6 IMPLANT
GLOVE BIOGEL PI IND STRL 8 (GLOVE) ×2 IMPLANT
GLOVE BIOGEL PI INDICATOR 8 (GLOVE) ×4
GOWN STRL REUS W/TWL LRG LVL3 (GOWN DISPOSABLE) ×3 IMPLANT
GOWN STRL REUS W/TWL XL LVL3 (GOWN DISPOSABLE) ×3 IMPLANT
KIT BASIN OR (CUSTOM PROCEDURE TRAY) ×3 IMPLANT
NDL SAFETY ECLIPSE 18X1.5 (NEEDLE) ×2 IMPLANT
NEEDLE HYPO 18GX1.5 SHARP (NEEDLE) ×4
PACK TOTAL JOINT (CUSTOM PROCEDURE TRAY) ×3 IMPLANT
STRIP CLOSURE SKIN 1/2X4 (GAUZE/BANDAGES/DRESSINGS) ×2 IMPLANT
SUT ETHIBOND NAB CT1 #1 30IN (SUTURE) ×3 IMPLANT
SUT MNCRL AB 4-0 PS2 18 (SUTURE) ×3 IMPLANT
SUT VIC AB 2-0 CT1 27 (SUTURE) ×4
SUT VIC AB 2-0 CT1 TAPERPNT 27 (SUTURE) ×2 IMPLANT
SUT VLOC 180 0 24IN GS25 (SUTURE) ×3 IMPLANT
SYR 20CC LL (SYRINGE) ×3 IMPLANT
SYR 50ML LL SCALE MARK (SYRINGE) ×3 IMPLANT
TOWEL OR 17X26 10 PK STRL BLUE (TOWEL DISPOSABLE) ×3 IMPLANT
TRAY FOLEY CATH 14FRSI W/METER (CATHETERS) ×3 IMPLANT

## 2014-08-04 NOTE — Anesthesia Preprocedure Evaluation (Signed)
Anesthesia Evaluation  Patient identified by MRN, date of birth, ID band Patient awake    Reviewed: Allergy & Precautions, H&P , NPO status , Patient's Chart, lab work & pertinent test results  Airway Mallampati: II TM Distance: >3 FB Neck ROM: Full    Dental no notable dental hx. (+) Caps, Dental Advisory Given,    Pulmonary neg pulmonary ROS, sleep apnea , former smoker,  breath sounds clear to auscultation  Pulmonary exam normal       Cardiovascular hypertension, Pt. on medications negative cardio ROS  + Valvular Problems/Murmurs AS Rhythm:Regular Rate:Normal     Neuro/Psych negative neurological ROS  negative psych ROS   GI/Hepatic negative GI ROS, Neg liver ROS,   Endo/Other  negative endocrine ROSMorbid obesity  Renal/GU negative Renal ROS  negative genitourinary   Musculoskeletal negative musculoskeletal ROS (+)   Abdominal   Peds negative pediatric ROS (+)  Hematology negative hematology ROS (+)   Anesthesia Other Findings   Reproductive/Obstetrics negative OB ROS                           Anesthesia Physical Anesthesia Plan  ASA: II  Anesthesia Plan: General   Post-op Pain Management:    Induction: Intravenous  Airway Management Planned: Oral ETT  Additional Equipment:   Intra-op Plan:   Post-operative Plan: Extubation in OR  Informed Consent: I have reviewed the patients History and Physical, chart, labs and discussed the procedure including the risks, benefits and alternatives for the proposed anesthesia with the patient or authorized representative who has indicated his/her understanding and acceptance.   Dental advisory given  Plan Discussed with: CRNA  Anesthesia Plan Comments: (Moderate AS. GA)        Anesthesia Quick Evaluation

## 2014-08-04 NOTE — Transfer of Care (Signed)
Immediate Anesthesia Transfer of Care Note  Patient: Victoria Delgado  Procedure(s) Performed: Procedure(s): RIGHT TOTAL HIP ARTHROPLASTY ANTERIOR APPROACH (Right)  Patient Location: PACU  Anesthesia Type:General  Level of Consciousness: awake, alert  and oriented  Airway & Oxygen Therapy: Patient Spontanous Breathing and Patient connected to face mask oxygen  Post-op Assessment: Report given to PACU RN and Post -op Vital signs reviewed and stable  Post vital signs: Reviewed and stable  Complications: No apparent anesthesia complications

## 2014-08-04 NOTE — Interval H&P Note (Signed)
History and Physical Interval Note:  08/04/2014 10:09 AM  Victoria Delgado  has presented today for surgery, with the diagnosis of OA RIGHT HIP  The various methods of treatment have been discussed with the patient and family. After consideration of risks, benefits and other options for treatment, the patient has consented to  Procedure(s): RIGHT TOTAL HIP ARTHROPLASTY ANTERIOR APPROACH (Right) as a surgical intervention .  The patient's history has been reviewed, patient examined, no change in status, stable for surgery.  I have reviewed the patient's chart and labs.  Questions were answered to the patient's satisfaction.     Loanne Drilling

## 2014-08-04 NOTE — Anesthesia Postprocedure Evaluation (Signed)
  Anesthesia Post-op Note  Patient: Victoria Delgado  Procedure(s) Performed: Procedure(s) (LRB): RIGHT TOTAL HIP ARTHROPLASTY ANTERIOR APPROACH (Right)  Patient Location: PACU  Anesthesia Type: General  Level of Consciousness: awake and alert   Airway and Oxygen Therapy: Patient Spontanous Breathing  Post-op Pain: mild  Post-op Assessment: Post-op Vital signs reviewed, Patient's Cardiovascular Status Stable, Respiratory Function Stable, Patent Airway and No signs of Nausea or vomiting  Last Vitals:  Filed Vitals:   08/04/14 1455  BP: 109/45  Pulse: 50  Temp: 36.5 C  Resp: 16    Post-op Vital Signs: stable   Complications: No apparent anesthesia complications

## 2014-08-04 NOTE — Op Note (Signed)
OPERATIVE REPORT  PREOPERATIVE DIAGNOSIS: Osteoarthritis of the Right hip.   POSTOPERATIVE DIAGNOSIS: Osteoarthritis of the Right  hip.   PROCEDURE: Right total hip arthroplasty, anterior approach.   SURGEON: Ollen Gross, MD   ASSISTANT: Avel Peace, PA-C  ANESTHESIA:  General  ESTIMATED BLOOD LOSS:- 300 ml  DRAINS: Hemovac x1.   COMPLICATIONS: None   CONDITION: PACU - hemodynamically stable.   BRIEF CLINICAL NOTE: Victoria Delgado is a 73 y.o. female who has advanced end-  stage arthritis of his Right  hip with progressively worsening pain and  dysfunction.The patient has failed nonoperative management and presents for  total hip arthroplasty.   PROCEDURE IN DETAIL: After successful administration of spinal  anesthetic, the traction boots for the [facility] bed were placed on both  feet and the patient was placed onto the [facility] bed, boots placed into the leg  holders. The Right hip was then isolated from the perineum with plastic  drapes and prepped and draped in the usual sterile fashion. ASIS and  greater trochanter were marked and a oblique incision was made, starting  at about 1 cm lateral and 2 cm distal to the ASIS and coursing towards  the anterior cortex of the femur. The skin was cut with a 10 blade  through subcutaneous tissue to the level of the fascia overlying the  tensor fascia lata muscle. The fascia was then incised in line with the  incision at the junction of the anterior third and posterior 2/3rd. The  muscle was teased off the fascia and then the interval between the TFL  and the rectus was developed. The Hohmann retractor was then placed at  the top of the femoral neck over the capsule. The vessels overlying the  capsule were cauterized and the fat on top of the capsule was removed.  A Hohmann retractor was then placed anterior underneath the rectus  femoris to give exposure to the entire anterior capsule. A T-shaped  capsulotomy was performed.  The edges were tagged and the femoral head  was identified.       Osteophytes are removed off the superior acetabulum.  The femoral neck was then cut in situ with an oscillating saw. Traction  was then applied to the left lower extremity utilizing the [facility]  traction. The femoral head was then removed. Retractors were placed  around the acetabulum and then circumferential removal of the labrum was  performed. Osteophytes were also removed. Reaming starts at 45 mm to  medialize and  Increased in 2 mm increments to 51 mm. We reamed in  approximately 40 degrees of abduction, 20 degrees anteversion. A 52 mm  pinnacle acetabular shell was then impacted in anatomic position under  fluoroscopic guidance with excellent purchase. We did not need to place  any additional dome screws. A 32 mm neutral + 4 marathon liner was then  placed into the acetabular shell.       The femoral lift was then placed along the lateral aspect of the femur  just distal to the vastus ridge. The leg was  externally rotated and capsule  was stripped off the inferior aspect of the femoral neck down to the  level of the lesser trochanter, this was done with electrocautery. The femur was lifted after this was performed. The  leg was then placed and extended in adducted position to essentially delivering the femur. We also removed the capsule superiorly and the  piriformis from the piriformis  fossa to gain excellent exposure of the  proximal femur. Rongeur was used to remove some cancellous bone to get  into the lateral portion of the proximal femur for placement of the  initial starter reamer. The starter broaches was placed  the starter broach  and was shown to go down the center of the canal. Broaching  with the  Corail system was then performed starting at size 8, coursing  Up to size 10. A size 10 had excellent torsional and rotational  and axial stability. The trial standard offset neck was then placed  with a 32 + 1  trial head. The hip was then reduced. We confirmed that  the stem was in the canal both on AP and lateral x-rays. It also has excellent sizing. The hip was reduced with outstanding stability through full extension, full external rotation,  and then flexion in adduction internal rotation. AP pelvis was taken  and the leg lengths were measured and found to be exactly equal. Hip  was then dislocated again and the femoral head and neck removed. The  femoral broach was removed. Size 10 Corail stem with a standard offset  neck was then impacted into the femur following native anteversion. Has  excellent purchase in the canal. Excellent torsional and rotational and  axial stability. It is confirmed to be in the canal on AP and lateral  fluoroscopic views. The 32 + 1 metal head was placed and the hip  reduced with outstanding stability. Again AP pelvis was taken and it  confirmed that the leg lengths were equal. The wound was then copiously  irrigated with saline solution and the capsule reattached and repaired  with Ethibond suture.  20 mL of Exparel mixed with 50 mL of saline then additional 20 ml of .25% Bupivicaine injected into the capsule and into the edge of the tensor fascia lata as well as subcutaneous tissue. The fascia overlying the tensor fascia lata was  then closed with a running #1 V-Loc. Subcu was closed with interrupted  2-0 Vicryl and subcuticular running 4-0 Monocryl. Incision was cleaned  and dried. Steri-Strips and a bulky sterile dressing applied. Hemovac  drain was hooked to suction and then he was awakened and transported to  recovery in stable condition.        Please note that a surgical assistant was a medical necessity for this procedure to perform it in a safe and expeditious manner. Assistant was necessary to provide appropriate retraction of vital neurovascular structures and to prevent femoral fracture and allow for anatomic placement of the prosthesis.  Ollen Gross,  M.D.

## 2014-08-04 NOTE — Progress Notes (Signed)
X-ray results noted 

## 2014-08-04 NOTE — Progress Notes (Signed)
Husband has CPAP Mask and Hose Setting is 8

## 2014-08-04 NOTE — Progress Notes (Signed)
Portable AP Pelvis X-ray done. 

## 2014-08-05 LAB — BASIC METABOLIC PANEL
Anion gap: 12 (ref 5–15)
BUN: 12 mg/dL (ref 6–23)
CALCIUM: 8.4 mg/dL (ref 8.4–10.5)
CO2: 23 mEq/L (ref 19–32)
Chloride: 106 mEq/L (ref 96–112)
Creatinine, Ser: 0.72 mg/dL (ref 0.50–1.10)
GFR, EST NON AFRICAN AMERICAN: 85 mL/min — AB (ref >90)
GLUCOSE: 152 mg/dL — AB (ref 70–99)
POTASSIUM: 4.3 meq/L (ref 3.7–5.3)
Sodium: 141 mEq/L (ref 137–147)

## 2014-08-05 LAB — CBC
HCT: 36.4 % (ref 36.0–46.0)
Hemoglobin: 12.5 g/dL (ref 12.0–15.0)
MCH: 30.1 pg (ref 26.0–34.0)
MCHC: 34.3 g/dL (ref 30.0–36.0)
MCV: 87.7 fL (ref 78.0–100.0)
Platelets: 216 10*3/uL (ref 150–400)
RBC: 4.15 MIL/uL (ref 3.87–5.11)
RDW: 12.8 % (ref 11.5–15.5)
WBC: 12.5 10*3/uL — ABNORMAL HIGH (ref 4.0–10.5)

## 2014-08-05 MED ORDER — METOPROLOL SUCCINATE ER 50 MG PO TB24
50.0000 mg | ORAL_TABLET | Freq: Every day | ORAL | Status: DC
  Administered 2014-08-05 – 2014-08-06 (×2): 50 mg via ORAL
  Filled 2014-08-05 (×2): qty 1

## 2014-08-05 MED ORDER — METOPROLOL SUCCINATE ER 25 MG PO TB24
25.0000 mg | ORAL_TABLET | Freq: Every day | ORAL | Status: DC
  Administered 2014-08-05: 25 mg via ORAL
  Filled 2014-08-05 (×2): qty 1

## 2014-08-05 NOTE — Evaluation (Addendum)
Physical Therapy Evaluation Patient Details Name: Victoria Delgado MRN: [id number] DOB: 1941/02/19 Today's Date: 08/05/2014   History of Present Illness  s/p right  DA THA;  Clinical Impression  Pt will benefit from PT to address deficits below; Expect excellent progress. Plan is for HHPT, will need RW.    Follow Up Recommendations Home health PT    Equipment Recommendations  Rolling walker with 5" wheels    Recommendations for Other Services       Precautions / Restrictions Precautions Precautions: Fall Restrictions Other Position/Activity Restrictions: WBAT      Mobility  Bed Mobility Overal bed mobility: Needs Assistance Bed Mobility: Supine to Sit     Supine to sit: Min assist     General bed mobility comments: cues for technique  Transfers Overall transfer level: Needs assistance Equipment used: Rolling walker (2 wheeled) Transfers: Sit to/from Stand Sit to Stand: Min assist;Min guard            Ambulation/Gait Ambulation/Gait assistance: Min guard;Min assist Ambulation Distance (Feet): 80 Feet (10' more) Assistive device: Rolling walker (2 wheeled) Gait Pattern/deviations: Step-to pattern;Step-through pattern;Decreased stride length     General Gait Details: cues for sequence, step through progression  Stairs            Wheelchair Mobility    Modified Rankin (Stroke Patients Only)       Balance                                             Pertinent Vitals/Pain Pain Assessment: No/denies pain    Home Living Family/patient expects to be discharged to:: Private residence Living Arrangements: Spouse/significant other   Type of Home: House   Entrance Stairs-Rails: None Entrance Stairs-Number of Steps: 3-4 Home Layout: One level Home Equipment: None      Prior Function Level of Independence: Independent               Hand Dominance        Extremity/Trunk Assessment   Upper Extremity Assessment:  Overall WFL for tasks assessed           Lower Extremity Assessment: RLE deficits/detail RLE Deficits / Details: Hip flexion limited to 90* d/t pain; ankle and knee grossly WFL       Communication   Communication: No difficulties  Cognition Arousal/Alertness: Awake/alert Behavior During Therapy: WFL for tasks assessed/performed Overall Cognitive Status: Within Functional Limits for tasks assessed                      General Comments      Exercises  quad sets and ankle pumps x 10 reps bil LEs      Assessment/Plan    PT Assessment Patient needs continued PT services  PT Diagnosis Difficulty walking   PT Problem List Decreased activity tolerance;Decreased mobility;Decreased knowledge of use of DME;Decreased range of motion;Decreased strength  PT Treatment Interventions DME instruction;Gait training;Stair training;Functional mobility training;Therapeutic activities;Therapeutic exercise;Patient/family education   PT Goals (Current goals can be found in the Care Plan section) Acute Rehab PT Goals Patient Stated Goal: decr pain, get better and coem back for left hip surgery PT Goal Formulation: With patient Time For Goal Achievement: 08/10/14 Potential to Achieve Goals: Good    Frequency 7X/week   Barriers to discharge        Co-evaluation  End of Session Equipment Utilized During Treatment: Gait belt Activity Tolerance: Patient tolerated treatment well Patient left: with call bell/phone within reach;in chair Nurse Communication: Mobility status         Time: 1224-8250 PT Time Calculation (min): 28 min   Charges:   PT Evaluation $Initial PT Evaluation Tier I: 1 Procedure PT Treatments $Gait Training: 23-37 mins   PT G Codes:          Victoria Delgado August 16, 2014, 9:17 AM

## 2014-08-05 NOTE — Care Management Note (Signed)
    Page 1 of 1   08/05/2014     11:06:21 AM CARE MANAGEMENT NOTE 08/05/2014  Patient:  LILLYIAN, HEIDT   Account Number:  [id number]  Date Initiated:  08/05/2014  Documentation initiated by:  [facility]  Subjective/Objective Assessment:   adm: right hip pain/ Right total hip arthroplasty, anterior approach     Action/Plan:   discharge planning   Anticipated DC Date:  08/05/2014   Anticipated DC Plan:  [place]  CM consult      [facility] Choice  HOME HEALTH   Choice offered to / List presented to:  C-1 Patient        [place] arranged  HH-2 PT      [place]   Status of service:  Completed, signed off Medicare Important Message given?  NA - LOS <3 / Initial given by admissions (If response is "NO", the following Medicare IM given date fields will be blank) Date Medicare IM given:   Medicare IM given by:   Date Additional Medicare IM given:   Additional Medicare IM given by:    Discharge Disposition:  [place]  Per UR Regulation:  Reviewed for med. necessity/level of care/duration of stay  If discussed at [place] of Stay Meetings, dates discussed:    Comments:  08/05/14 10:30 CM met with pt in room to offer choice of home health agency.  Pt chooses [facility] to render HHPT.  Address and contact information verified with pt.  Orders and F2F in place.  Referral called to [facility] (on unit) and DME rep, Lecretia to deliver 3n1 and rolling walker to room prior to discharge.  No other CM needs were communicated.  Mariane Masters, BSN, Cm [phone].

## 2014-08-05 NOTE — Evaluation (Signed)
Occupational Therapy Evaluation Patient Details Name: Victoria Delgado MRN: [id number] DOB: October 30, 1941 Today's Date: 08/05/2014    History of Present Illness s/p right  DA THA;   Clinical Impression   Pt doing very well. Practiced toilet transfers and educated on AE options. Will follow to practice shower transfers for d/c.    Follow Up Recommendations  No OT follow up    Equipment Recommendations  3 in 1 bedside comode (delivered)    Recommendations for Other Services       Precautions / Restrictions Precautions Precautions: Fall Restrictions Weight Bearing Restrictions: No Other Position/Activity Restrictions: WBAT      Mobility Bed Mobility              Transfers Overall transfer level: Needs assistance Equipment used: Rolling walker (2 wheeled) Transfers: Sit to/from Stand Sit to Stand: Min guard         General transfer comment: verbal cues hand placement.    Balance                                            ADL Overall ADL's : Needs assistance/impaired Eating/Feeding: Independent;Sitting   Grooming: Wash/dry hands;Set up;Sitting   Upper Body Bathing: Set up;Sitting   Lower Body Bathing: Minimal assistance;Sit to/from stand   Upper Body Dressing : Set up;Sitting   Lower Body Dressing: Moderate assistance;Sit to/from stand   Toilet Transfer: Min guard;Minimal assistance;Ambulation;BSC;RW   Toileting- Architect and Hygiene: Min guard;Sit to/from stand         General ADL Comments: Occasional min assist to negotiate tigher space in bathroom with walker. Pt states she has a tight space in her bathroom also so educated and practiced side stepping with walker also. Educated pt on 3in1 as shower chair but pt not sure if it will fit in shower. She does have a shower chair also. Educated on AE options and pt not interested in sock aid or shoe horn. She has a reacher so reviewed uses and demonstrated.      Vision                      Perception     Praxis      Pertinent Vitals/Pain Pain Assessment: 0-10 Pain Score: 2  Pain Location: R hip Pain Descriptors / Indicators: Sore Pain Intervention(s): Repositioned;Ice applied     Hand Dominance     Extremity/Trunk Assessment Upper Extremity Assessment Upper Extremity Assessment: Overall WFL for tasks assessed          Communication Communication Communication: No difficulties   Cognition Arousal/Alertness: Awake/alert Behavior During Therapy: WFL for tasks assessed/performed Overall Cognitive Status: Within Functional Limits for tasks assessed                     General Comments       Exercises       Shoulder Instructions      Home Living Family/patient expects to be discharged to:: Private residence Living Arrangements: Spouse/significant other   Type of Home: House   Entrance Stairs-Number of Steps: 3-4 Entrance Stairs-Rails: None Home Layout: One level     Bathroom Shower/Tub: Producer, television/film/video: Handicapped height     Home Equipment: None;Adaptive equipment Adaptive Equipment: Reacher;Long-handled sponge        Prior Functioning/Environment Level of Independence: Independent  OT Diagnosis: Generalized weakness   OT Problem List: Decreased strength;Decreased knowledge of use of DME or AE   OT Treatment/Interventions: Self-care/ADL training;Patient/family education;Therapeutic activities;DME and/or AE instruction    OT Goals(Current goals can be found in the care plan section) Acute Rehab OT Goals Patient Stated Goal: decr pain, get better and coem back for left hip surgery OT Goal Formulation: With patient Time For Goal Achievement: 08/12/14 Potential to Achieve Goals: Good  OT Frequency: Min 2X/week   Barriers to D/C:            Co-evaluation              End of Session Equipment Utilized During Treatment: Rolling walker;Other  (comment)  Activity Tolerance: Patient tolerated treatment well Patient left: in chair;with call bell/phone within reach   Time: 1009-1038 OT Time Calculation (min): 29 min Charges:  OT General Charges $OT Visit: 1 Procedure OT Evaluation $Initial OT Evaluation Tier I: 1 Procedure OT Treatments $Self Care/Home Management : 8-22 mins $Therapeutic Activity: 8-22 mins G-Codes:    Lennox Laity [phone] 08/05/2014, 11:13 AM

## 2014-08-05 NOTE — Progress Notes (Signed)
Physical Therapy Treatment Patient Details Name: Victoria Delgado MRN: [id number] DOB: Jul 04, 1941 Today's Date: 08/05/2014    History of Present Illness s/p right  DA THA;    PT Comments    Pt progressing well; She does c/o R knee pain greater than hip, discussed possibility of this being d/t  Surgical table/positioning during surgery.  We did defer a few exercises this pm so as not to exacerbate the knee  Follow Up Recommendations  Home health PT     Equipment Recommendations  Rolling walker with 5" wheels    Recommendations for Other Services       Precautions / Restrictions Precautions Precautions: Fall Restrictions Weight Bearing Restrictions: No Other Position/Activity Restrictions: WBAT    Mobility  Bed Mobility                  Transfers Overall transfer level: Needs assistance Equipment used: Rolling walker (2 wheeled) Transfers: Sit to/from Stand Sit to Stand: Supervision         General transfer comment: verbal cues hand placement, pt able to self correct  Ambulation/Gait Ambulation/Gait assistance: Supervision;Min guard Ambulation Distance (Feet): 80 Feet Assistive device: Rolling walker (2 wheeled) Gait Pattern/deviations: Step-through pattern     General Gait Details: cuesf ro sequence, rest breaks as needed    Stairs            Wheelchair Mobility    Modified Rankin (Stroke Patients Only)       Balance                                    Cognition Arousal/Alertness: Awake/alert Behavior During Therapy: WFL for tasks assessed/performed Overall Cognitive Status: Within Functional Limits for tasks assessed                      Exercises Total Joint Exercises Ankle Circles/Pumps: AROM;Both;5 reps Quad Sets: AROM;Both;10 reps Heel Slides: AROM;AAROM;Right;10 reps Hip ABduction/ADduction: AROM;AAROM;Right;10 reps Long Arc Quad: AROM;Strengthening;Right;10 reps;Seated    General Comments         Pertinent Vitals/Pain Pain Assessment: 0-10 Pain Score: 2  Pain Location: right knee and right hip Pain Descriptors / Indicators: Tightness;Sore Pain Intervention(s): Monitored during session;Premedicated before session;Repositioned;Ice applied    Home Living Family/patient expects to be discharged to:: Private residence Living Arrangements: Spouse/significant other   Type of Home: House   Entrance Stairs-Rails: None Home Layout: One level Home Equipment: None;Adaptive equipment      Prior Function Level of Independence: Independent          PT Goals (current goals can now be found in the care plan section) Acute Rehab PT Goals Patient Stated Goal: decr pain, get better and come  back for left hip surgery PT Goal Formulation: With patient Time For Goal Achievement: 08/10/14 Potential to Achieve Goals: Good Progress towards PT goals: Progressing toward goals    Frequency  7X/week    PT Plan Current plan remains appropriate    Co-evaluation             End of Session Equipment Utilized During Treatment: Gait belt Activity Tolerance: Patient tolerated treatment well Patient left: with call bell/phone within reach;in chair     Time: 4098-1191 PT Time Calculation (min): 26 min  Charges:  $Gait Training: 8-22 mins $Therapeutic Exercise: 8-22 mins  G CodesDrucilla Chalet 08/05/2014, 2:11 PM

## 2014-08-05 NOTE — Progress Notes (Signed)
   Subjective: 1 Day Post-Op Procedure(s) (LRB): RIGHT TOTAL HIP ARTHROPLASTY ANTERIOR APPROACH (Right) Patient reports pain as mild.   Patient seen in rounds by Dr. Lequita Halt. Doing well this morning. Patient is well, and has had no acute complaints or problems We will start therapy today.  Plan is to go Home after hospital stay. Possibly tomorrow.  Objective: Vital signs in last 24 hours: Temp:  [97.6 F (36.4 C)-98.9 F (37.2 C)] 98.3 F (36.8 C) (09/03 0607) Pulse Rate:  [49-71] 54 (09/03 0607) Resp:  [12-17] 17 (09/03 0607) BP: (100-146)/(41-60) 131/57 mmHg (09/03 0607) SpO2:  [92 %-98 %] 98 % (09/03 0607) Weight:  [128.368 kg (283 lb)] 128.368 kg (283 lb) (09/02 0904)  Intake/Output from previous day:  Intake/Output Summary (Last 24 hours) at 08/05/14 0856 Last data filed at 08/05/14 0755  Gross per 24 hour  Intake 3983.75 ml  Output   1985 ml  Net 1998.75 ml    Intake/Output this shift: Total I/O In: 360 [P.O.:360] Out: -   Labs:  Recent Labs  08/05/14 0520  HGB 12.5    Recent Labs  08/05/14 0520  WBC 12.5*  RBC 4.15  HCT 36.4  PLT 216    Recent Labs  08/05/14 0520  NA 141  K 4.3  CL 106  CO2 23  BUN 12  CREATININE 0.72  GLUCOSE 152*  CALCIUM 8.4   No results found for this basename: LABPT, INR,  in the last 72 hours  EXAM General - Patient is Alert, Appropriate and Oriented Extremity - Neurovascular intact Sensation intact distally Dressing - dressing C/D/I Motor Function - intact, moving foot and toes well on exam.  Hemovac pulled without difficulty.  Past Medical History  Diagnosis Date  . Cataract     corrective surgery done  . Hypertension   . GERD (gastroesophageal reflux disease)   . Hyperlipidemia   . Sleep apnea     CPAP  . Hernia, hiatal   . Heart palpitations   . Arthritis     osteoarthritis-hips,knees, back, hands  . Hepatitis     past hx.,many yrs ago ? food source    Assessment/Plan: 1 Day Post-Op  Procedure(s) (LRB): RIGHT TOTAL HIP ARTHROPLASTY ANTERIOR APPROACH (Right) Principal Problem:   OA (osteoarthritis) of hip  Estimated body mass index is 43.04 kg/(m^2) as calculated from the following:   Height as of this encounter: 5\' 8"  (1.727 m).   Weight as of this encounter: 128.368 kg (283 lb). Advance diet Up with therapy Discharge home with home health  DVT Prophylaxis - Lovenox (patient takes Cardizem) Weight Bearing As Tolerated right Leg Hemovac Pulled Begin Therapy   Avel Peace, PA-C Orthopaedic Surgery 08/05/2014, 8:56 AM

## 2014-08-06 LAB — BASIC METABOLIC PANEL
ANION GAP: 12 (ref 5–15)
BUN: 14 mg/dL (ref 6–23)
CHLORIDE: 106 meq/L (ref 96–112)
CO2: 28 mEq/L (ref 19–32)
Calcium: 9 mg/dL (ref 8.4–10.5)
Creatinine, Ser: 0.76 mg/dL (ref 0.50–1.10)
GFR calc Af Amer: >90 mL/min (ref >90)
GFR, EST NON AFRICAN AMERICAN: 83 mL/min — AB (ref >90)
Glucose, Bld: 140 mg/dL — ABNORMAL HIGH (ref 70–99)
POTASSIUM: 5 meq/L (ref 3.7–5.3)
SODIUM: 146 meq/L (ref 137–147)

## 2014-08-06 LAB — CBC
HCT: 38.5 % (ref 36.0–46.0)
Hemoglobin: 12.7 g/dL (ref 12.0–15.0)
MCH: 29.6 pg (ref 26.0–34.0)
MCHC: 33 g/dL (ref 30.0–36.0)
MCV: 89.7 fL (ref 78.0–100.0)
Platelets: 229 10*3/uL (ref 150–400)
RBC: 4.29 MIL/uL (ref 3.87–5.11)
RDW: 13.5 % (ref 11.5–15.5)
WBC: 14.4 10*3/uL — AB (ref 4.0–10.5)

## 2014-08-06 MED ORDER — OXYCODONE HCL 5 MG PO TABS: 5.0000 mg | ORAL_TABLET | ORAL | Status: DC | PRN

## 2014-08-06 MED ORDER — ENOXAPARIN (LOVENOX) PATIENT EDUCATION KIT
PACK | Freq: Once | Status: AC
  Administered 2014-08-06: 11:00:00
  Filled 2014-08-06: qty 1

## 2014-08-06 MED ORDER — METHOCARBAMOL 500 MG PO TABS: 500.0000 mg | ORAL_TABLET | Freq: Four times a day (QID) | ORAL | Status: DC | PRN

## 2014-08-06 MED ORDER — ENOXAPARIN SODIUM 40 MG/0.4ML ~~LOC~~ SOLN: 40.0000 mg | SUBCUTANEOUS | Status: DC

## 2014-08-06 NOTE — Progress Notes (Signed)
   Subjective: 2 Days Post-Op Procedure(s) (LRB): RIGHT TOTAL HIP ARTHROPLASTY ANTERIOR APPROACH (Right) Patient reports pain as mild.   Patient seen in rounds for Dr. Lequita Halt. Patient is well, but has had some minor complaints of pain in the hip, requiring pain medications Patient is ready to go home today after therapy  Objective: Vital signs in last 24 hours: Temp:  [97.6 F (36.4 C)-98.9 F (37.2 C)] 97.9 F (36.6 C) (09/04 0520) Pulse Rate:  [53-56] 55 (09/04 0520) Resp:  [16-18] 18 (09/03 2115) BP: (113-133)/(46-59) 124/59 mmHg (09/04 0520) SpO2:  [95 %-98 %] 96 % (09/04 0520)  Intake/Output from previous day:  Intake/Output Summary (Last 24 hours) at 08/06/14 0854 Last data filed at 08/06/14 0843  Gross per 24 hour  Intake    954 ml  Output   3200 ml  Net  -2246 ml    Intake/Output this shift: Total I/O In: 200 [P.O.:200] Out: -   Labs:  Recent Labs  08/05/14 0520 08/06/14 0549  HGB 12.5 12.7    Recent Labs  08/05/14 0520 08/06/14 0549  WBC 12.5* 14.4*  RBC 4.15 4.29  HCT 36.4 38.5  PLT 216 229    Recent Labs  08/05/14 0520 08/06/14 0549  NA 141 146  K 4.3 5.0  CL 106 106  CO2 23 28  BUN 12 14  CREATININE 0.72 0.76  GLUCOSE 152* 140*  CALCIUM 8.4 9.0   No results found for this basename: LABPT, INR,  in the last 72 hours  EXAM: General - Patient is Alert, Appropriate and Oriented Extremity - Neurovascular intact Sensation intact distally Dorsiflexion/Plantar flexion intact Incision - clean, dry, no drainage, healing Motor Function - intact, moving foot and toes well on exam.   Assessment/Plan: 2 Days Post-Op Procedure(s) (LRB): RIGHT TOTAL HIP ARTHROPLASTY ANTERIOR APPROACH (Right) Procedure(s) (LRB): RIGHT TOTAL HIP ARTHROPLASTY ANTERIOR APPROACH (Right) Past Medical History  Diagnosis Date  . Cataract     corrective surgery done  . Hypertension   . GERD (gastroesophageal reflux disease)   . Hyperlipidemia   . Sleep apnea      CPAP  . Hernia, hiatal   . Heart palpitations   . Arthritis     osteoarthritis-hips,knees, back, hands  . Hepatitis     past hx.,many yrs ago ? food source   Principal Problem:   OA (osteoarthritis) of hip  Estimated body mass index is 43.04 kg/(m^2) as calculated from the following:   Height as of this encounter: 5\' 8"  (1.727 m).   Weight as of this encounter: 128.368 kg (283 lb). Up with therapy Discharge home with home health Diet - Cardiac diet Follow up - in 2 weeks Activity - WBAT Disposition - Home Condition Upon Discharge - Good D/C Meds - See DC Summary DVT Prophylaxis - Lovenox and then Aspirin  Avel Peace, PA-C Orthopaedic Surgery 08/06/2014, 8:54 AM

## 2014-08-06 NOTE — Progress Notes (Signed)
[facility]   [facility] is providing the following services: RW and Commode  If patient discharges after hours, please call [phone].   Renard Hamper 08/06/2014, 9:44 AM

## 2014-08-06 NOTE — Progress Notes (Signed)
OT Cancellation Note  Patient Details Name: Victoria Delgado MRN: [id number] DOB: 11/30/1941   Cancelled Treatment:    Reason Eval/Treat Not Completed: Patient declined,  - pt states she feels she can manage transfer into walk in shower and doesn't feel she needs to practice and feels confident with her ability to perform BADLs.   Angelene Giovanni [place], OTR/L [phone]  08/06/2014, 10:53 AM

## 2014-08-06 NOTE — Discharge Instructions (Signed)
°Dr. Frank Aluisio °Total Joint Specialist °[facility] °[address] °[place], [place] [address] °[phone] ° ° ° °ANTERIOR APPROACH TOTAL HIP REPLACEMENT POSTOPERATIVE DIRECTIONS ° ° °Hip Rehabilitation, Guidelines Following Surgery  °The results of a hip operation are greatly improved after range of motion and muscle strengthening exercises. Follow all safety measures which are given to protect your hip. If any of these exercises cause increased pain or swelling in your joint, decrease the amount until you are comfortable again. Then slowly increase the exercises. Call your caregiver if you have problems or questions.  °HOME CARE INSTRUCTIONS  °Most of the following instructions are designed to prevent the dislocation of your new hip.  °Remove items at home which could result in a fall. This includes throw rugs or furniture in walking pathways.  °Continue medications as instructed at time of discharge. °· You may have some home medications which will be placed on hold until you complete the course of blood thinner medication. °· You may start showering once you are discharged home but do not submerge the incision under water. Just pat the incision dry and apply a dry gauze dressing on daily. °Do not put on socks or shoes without following the instructions of your caregivers.  °Sit on high chairs which makes it easier to stand.  °Sit on chairs with arms. Use the chair arms to help push yourself up when arising.  °Keep your leg on the side of the operation out in front of you when standing up.  °Arrange for the use of a toilet seat elevator so you are not sitting low.   °· Walk with walker as instructed.  °You may resume a sexual relationship in one month or when given the OK by your caregiver.  °Use walker as long as suggested by your caregivers.  °You may put full weight on your legs and walk as much as is comfortable. °Avoid periods of inactivity such as sitting longer than an hour  when not asleep. This helps prevent blood clots.  °You may return to work once you are cleared by your surgeon.  °Do not drive a car for 6 weeks or until released by your surgeon.  °Do not drive while taking narcotics.  °Wear elastic stockings for three weeks following surgery during the day but you may remove then at night.  °Make sure you keep all of your appointments after your operation with all of your doctors and caregivers. You should call the office at the above phone number and make an appointment for approximately two weeks after the date of your surgery. °Change the dressing daily and reapply a dry dressing each time. °Please pick up a stool softener and laxative for home use as long as you are requiring pain medications. °· Continue to use ice on the hip for pain and swelling from surgery. You may notice swelling that will progress down to the foot and ankle.  This is normal after  surgery.  Elevate the leg when you are not up walking on it.   °It is important for you to complete the blood thinner medication as prescribed by your doctor. °· Continue to use the breathing machine which will help keep your temperature down.  It is common for your temperature to cycle up and down following surgery, especially at night when you are not up moving around and exerting yourself.  The breathing machine keeps your lungs expanded and your temperature down. ° °RANGE OF MOTION AND STRENGTHENING EXERCISES  °  These exercises are designed to help you keep full movement of your hip joint. Follow your caregiver's or physical therapist's instructions. Perform all exercises about fifteen times, three times per day or as directed. Exercise both hips, even if you have had only one joint replacement. These exercises can be done on a training (exercise) mat, on the floor, on a table or on a bed. Use whatever works the best and is most comfortable for you. Use music or television while you are exercising so that the exercises are  a pleasant break in your day. This will make your life better with the exercises acting as a break in routine you can look forward to.  Lying on your back, slowly slide your foot toward your buttocks, raising your knee up off the floor. Then slowly slide your foot back down until your leg is straight again.  Lying on your back spread your legs as far apart as you can without causing discomfort.  Lying on your side, raise your upper leg and foot straight up from the floor as far as is comfortable. Slowly lower the leg and repeat.  Lying on your back, tighten up the muscle in the front of your thigh (quadriceps muscles). You can do this by keeping your leg straight and trying to raise your heel off the floor. This helps strengthen the largest muscle supporting your knee.  Lying on your back, tighten up the muscles of your buttocks both with the legs straight and with the knee bent at a comfortable angle while keeping your heel on the floor.   SKILLED REHAB INSTRUCTIONS: If the patient is transferred to a skilled rehab facility following release from the hospital, a list of the current medications will be sent to the facility for the patient to continue.  When discharged from the skilled rehab facility, please have the facility set up the patient's Home Health Physical Therapy prior to being released. Also, the skilled facility will be responsible for providing the patient with their medications at time of release from the facility to include their pain medication, the muscle relaxants, and their blood thinner medication. If the patient is still at the rehab facility at time of the two week follow up appointment, the skilled rehab facility will also need to assist the patient in arranging follow up appointment in our office and any transportation needs.  MAKE SURE YOU:  Understand these instructions.  Will watch your condition.  Will get help right away if you are not doing well or get worse.  Pick up  stool softner and laxative for home. Do not submerge incision under water. May shower. Continue to use ice for pain and swelling from surgery. Total Hip Protocol.  Lovenox for eight more days, then start taking a full dose 325 mg Aspirin twice a day for three weeks. After the three weeks of 325 mg Aspirin, then resume the daily 81 mg Aspirin.

## 2014-08-06 NOTE — Discharge Summary (Signed)
Physician Discharge Summary   Patient ID: Victoria Delgado MRN: [id number] DOB/AGE: 02/20/1941 73 y.o.  Admit date: 08/04/2014 Discharge date: 08/06/2014  Primary Diagnosis:  Osteoarthritis of the Right hip.   Admission Diagnoses:  Past Medical History  Diagnosis Date  . Cataract     corrective surgery done  . Hypertension   . GERD (gastroesophageal reflux disease)   . Hyperlipidemia   . Sleep apnea     CPAP  . Hernia, hiatal   . Heart palpitations   . Arthritis     osteoarthritis-hips,knees, back, hands  . Hepatitis     past hx.,many yrs ago ? food source   Discharge Diagnoses:   Principal Problem:   OA (osteoarthritis) of hip  Estimated body mass index is 43.04 kg/(m^2) as calculated from the following:   Height as of this encounter: 5' 8" (1.727 m).   Weight as of this encounter: 128.368 kg (283 lb).  Procedure(s) (LRB): RIGHT TOTAL HIP ARTHROPLASTY ANTERIOR APPROACH (Right)   Consults: None  HPI: Victoria Delgado is a 72 y.o. female who has advanced end-  stage arthritis of his Right hip with progressively worsening pain and  dysfunction.The patient has failed nonoperative management and presents for  total hip arthroplasty.   Laboratory Data: Admission on 08/04/2014  Component Date Value Ref Range Status  . WBC 08/05/2014 12.5* 4.0 - 10.5 K/uL Final  . RBC 08/05/2014 4.15  3.87 - 5.11 MIL/uL Final  . Hemoglobin 08/05/2014 12.5  12.0 - 15.0 g/dL Final  . HCT 08/05/2014 36.4  36.0 - 46.0 % Final  . MCV 08/05/2014 87.7  78.0 - 100.0 fL Final  . MCH 08/05/2014 30.1  26.0 - 34.0 pg Final  . MCHC 08/05/2014 34.3  30.0 - 36.0 g/dL Final  . RDW 08/05/2014 12.8  11.5 - 15.5 % Final  . Platelets 08/05/2014 216  150 - 400 K/uL Final  . Sodium 08/05/2014 141  137 - 147 mEq/L Final  . Potassium 08/05/2014 4.3  3.7 - 5.3 mEq/L Final  . Chloride 08/05/2014 106  96 - 112 mEq/L Final  . CO2 08/05/2014 23  19 - 32 mEq/L Final  . Glucose, Bld 08/05/2014 152* 70 - 99 mg/dL Final    . BUN 08/05/2014 12  6 - 23 mg/dL Final  . Creatinine, Ser 08/05/2014 0.72  0.50 - 1.10 mg/dL Final  . Calcium 08/05/2014 8.4  8.4 - 10.5 mg/dL Final  . GFR calc non Af Amer 08/05/2014 85* >90 mL/min Final  . GFR calc Af Amer 08/05/2014 >90  >90 mL/min Final   Comment: (NOTE)                          The eGFR has been calculated using the CKD EPI equation.                          This calculation has not been validated in all clinical situations.                          eGFR's persistently <90 mL/min signify possible Chronic Kidney                          Disease.  . Anion gap 08/05/2014 12  5 - 15 Final  . WBC 08/06/2014 14.4* 4.0 - 10.5 K/uL Final  . RBC 08/06/2014 4.29  3.87 - 5.11 MIL/uL Final  . Hemoglobin 08/06/2014 12.7  12.0 - 15.0 g/dL Final  . HCT 08/06/2014 38.5  36.0 - 46.0 % Final  . MCV 08/06/2014 89.7  78.0 - 100.0 fL Final  . MCH 08/06/2014 29.6  26.0 - 34.0 pg Final  . MCHC 08/06/2014 33.0  30.0 - 36.0 g/dL Final  . RDW 08/06/2014 13.5  11.5 - 15.5 % Final  . Platelets 08/06/2014 229  150 - 400 K/uL Final  . Sodium 08/06/2014 146  137 - 147 mEq/L Final  . Potassium 08/06/2014 5.0  3.7 - 5.3 mEq/L Final  . Chloride 08/06/2014 106  96 - 112 mEq/L Final  . CO2 08/06/2014 28  19 - 32 mEq/L Final  . Glucose, Bld 08/06/2014 140* 70 - 99 mg/dL Final  . BUN 08/06/2014 14  6 - 23 mg/dL Final  . Creatinine, Ser 08/06/2014 0.76  0.50 - 1.10 mg/dL Final  . Calcium 08/06/2014 9.0  8.4 - 10.5 mg/dL Final  . GFR calc non Af Amer 08/06/2014 83* >90 mL/min Final  . GFR calc Af Amer 08/06/2014 >90  >90 mL/min Final   Comment: (NOTE)                          The eGFR has been calculated using the CKD EPI equation.                          This calculation has not been validated in all clinical situations.                          eGFR's persistently <90 mL/min signify possible Chronic Kidney                          Disease.  Georgiann Hahn gap 08/06/2014 12  5 - 15 Final  Hospital  Outpatient Visit on 07/28/2014  Component Date Value Ref Range Status  . MRSA, PCR 07/28/2014 NEGATIVE  NEGATIVE Final  . Staphylococcus aureus 07/28/2014 NEGATIVE  NEGATIVE Final   Comment:                                 The Xpert SA Assay (FDA                          approved for NASAL specimens                          in patients over 80 years of age),                          is one component of                          a comprehensive surveillance                          program.  Test performance has                          been validated by [employer] [facility] for patients greater                          than or equal to 43 year old.                          It is not intended                          to diagnose infection nor to                          guide or monitor treatment.  Marland Kitchen aPTT 07/28/2014 32  24 - 37 seconds Final  . WBC 07/28/2014 6.4  4.0 - 10.5 K/uL Final  . RBC 07/28/2014 4.91  3.87 - 5.11 MIL/uL Final  . Hemoglobin 07/28/2014 14.7  12.0 - 15.0 g/dL Final  . HCT 07/28/2014 43.0  36.0 - 46.0 % Final  . MCV 07/28/2014 87.6  78.0 - 100.0 fL Final  . MCH 07/28/2014 29.9  26.0 - 34.0 pg Final  . MCHC 07/28/2014 34.2  30.0 - 36.0 g/dL Final  . RDW 07/28/2014 13.1  11.5 - 15.5 % Final  . Platelets 07/28/2014 250  150 - 400 K/uL Final  . Sodium 07/28/2014 143  137 - 147 mEq/L Final  . Potassium 07/28/2014 4.5  3.7 - 5.3 mEq/L Final  . Chloride 07/28/2014 101  96 - 112 mEq/L Final  . CO2 07/28/2014 27  19 - 32 mEq/L Final  . Glucose, Bld 07/28/2014 122* 70 - 99 mg/dL Final  . BUN 07/28/2014 14  6 - 23 mg/dL Final  . Creatinine, Ser 07/28/2014 0.97  0.50 - 1.10 mg/dL Final  . Calcium 07/28/2014 10.3  8.4 - 10.5 mg/dL Final  . Total Protein 07/28/2014 7.6  6.0 - 8.3 g/dL Final  . Albumin 07/28/2014 4.0  3.5 - 5.2 g/dL Final  . AST 07/28/2014 16  0 - 37 U/L Final   HEMOLYSIS AT THIS LEVEL MAY AFFECT RESULT  . ALT 07/28/2014 16  0 - 35 U/L Final   . Alkaline Phosphatase 07/28/2014 99  39 - 117 U/L Final  . Total Bilirubin 07/28/2014 0.3  0.3 - 1.2 mg/dL Final  . GFR calc non Af Amer 07/28/2014 58* >90 mL/min Final  . GFR calc Af Amer 07/28/2014 67* >90 mL/min Final   Comment: (NOTE)                          The eGFR has been calculated using the CKD EPI equation.                          This calculation has not been validated in all clinical situations.                          eGFR's persistently <90 mL/min signify possible Chronic Kidney                          Disease.  . Anion gap 07/28/2014 15  5 - 15 Final  . Prothrombin Time 07/28/2014 13.1  11.6 - 15.2 seconds Final  . INR 07/28/2014 0.99  0.00 - 1.49 Final  . ABO/RH(D) 07/28/2014 A POS   Final  .  Antibody Screen 07/28/2014 NEG   Final  . Sample Expiration 07/28/2014 08/07/2014   Final  . Color, Urine 07/28/2014 YELLOW  YELLOW Final  . APPearance 07/28/2014 CLEAR  CLEAR Final  . Specific Gravity, Urine 07/28/2014 1.007  1.005 - 1.030 Final  . pH 07/28/2014 6.0  5.0 - 8.0 Final  . Glucose, UA 07/28/2014 NEGATIVE  NEGATIVE mg/dL Final  . Hgb urine dipstick 07/28/2014 NEGATIVE  NEGATIVE Final  . Bilirubin Urine 07/28/2014 NEGATIVE  NEGATIVE Final  . Ketones, ur 07/28/2014 NEGATIVE  NEGATIVE mg/dL Final  . Protein, ur 07/28/2014 NEGATIVE  NEGATIVE mg/dL Final  . Urobilinogen, UA 07/28/2014 0.2  0.0 - 1.0 mg/dL Final  . Nitrite 07/28/2014 NEGATIVE  NEGATIVE Final  . Leukocytes, UA 07/28/2014 NEGATIVE  NEGATIVE Final   MICROSCOPIC NOT DONE ON URINES WITH NEGATIVE PROTEIN, BLOOD, LEUKOCYTES, NITRITE, OR GLUCOSE <1000 mg/dL.  . ABO/RH(D) 07/28/2014 A POS   Final     X-Rays:Dg Chest 2 View  07/28/2014   CLINICAL DATA:  Preoperative evaluation, hypertension  EXAM: CHEST  2 VIEW  COMPARISON:  02/09/2011  FINDINGS: The heart size and mediastinal contours are within normal limits. Both lungs are clear. The visualized skeletal structures are unremarkable.  IMPRESSION: No active  cardiopulmonary disease.   Electronically Signed   By: Inez Catalina M.D.   On: 07/28/2014 12:40   Dg Hip Complete Right  07/28/2014   CLINICAL DATA:  Preoperative evaluation for right hip surgery.  EXAM: RIGHT HIP - COMPLETE 2+ VIEW  COMPARISON:  No priors.  FINDINGS: AP view of the pelvis and AP and lateral views of the right hip demonstrate joint space narrowing, subchondral sclerosis, subchondral cyst formation and osteophyte formation in the hip joints bilaterally (right greater than left), compatible with moderate to severe osteoarthritis. No acute displaced fractures. The right hip is properly located. Small calcification in the central pelvis may represent a partially calcified fibroid or phlebolith.  IMPRESSION: 1. Moderate to severe bilateral hip joint osteoarthritis (right greater than left).   Electronically Signed   By: Vinnie Langton M.D.   On: 07/28/2014 12:37   Dg Pelvis Portable  08/04/2014   CLINICAL DATA:  Postop right hip arthroplasty.  EXAM: DG C-ARM 1-60 MIN - NRPT [facility]; PORTABLE PELVIS 1-2 VIEWS  COMPARISON:  07/28/2014.  FINDINGS: Interval right hip arthroplasty. Femoral stem appears well seated. Surgical drain overlies the right hip. Subcutaneous emphysema seen as well. There are moderate degenerative changes in the left hip.  IMPRESSION: 1. Interval right hip arthroplasty with expected postoperative findings. 2. Moderate left hip osteoarthritis.   Electronically Signed   By: Lorin Picket M.D.   On: 08/04/2014 13:25   Dg C-arm 1-60 Min-no Report  08/04/2014   CLINICAL DATA:  Postop right hip arthroplasty.  EXAM: DG C-ARM 1-60 MIN - [facility]PT [facility]; PORTABLE PELVIS 1-2 VIEWS  COMPARISON:  07/28/2014.  FINDINGS: Interval right hip arthroplasty. Femoral stem appears well seated. Surgical drain overlies the right hip. Subcutaneous emphysema seen as well. There are moderate degenerative changes in the left hip.  IMPRESSION: 1. Interval right hip arthroplasty with expected postoperative  findings. 2. Moderate left hip osteoarthritis.   Electronically Signed   By: Lorin Picket M.D.   On: 08/04/2014 13:25    EKG: Orders placed in visit on 04/30/14  . EKG 12-LEAD     Hospital Course: Patient was admitted to [facility] and taken to the OR and underwent the above state procedure without complications.  Patient tolerated the  procedure well and was later transferred to the recovery room and then to the orthopaedic floor for postoperative care.  They were given PO and IV analgesics for pain control following their surgery.  They were given 24 hours of postoperative antibiotics of  Anti-infectives   Start     Dose/Rate Route Frequency Ordered Stop   08/04/14 1800  ceFAZolin (ANCEF) IVPB 2 g/50 mL premix     2 g 100 mL/hr over 30 Minutes Intravenous Every 6 hours 08/04/14 1613 08/04/14 2350   08/04/14 0600  vancomycin (VANCOCIN) 1,500 mg in sodium chloride 0.9 % 500 mL IVPB     1,500 mg 250 mL/hr over 120 Minutes Intravenous On call to O.R. 08/03/14 1310 08/04/14 1220     and started on DVT prophylaxis in the form of Lovenox.   PT and OT were ordered for total hip protocol.  The patient was allowed to be WBAT with therapy. Discharge planning was consulted to help with postop disposition and equipment needs.  Patient had a good night on the evening of surgery.  They started to get up OOB with therapy on day one.  Hemovac drain was pulled without difficulty.  Continued to work with therapy into day two.  Dressing was changed on day two and the incision was healing well. Patient was seen in rounds and was ready to go home.  Discharge home with home health  Diet - Cardiac diet  Follow up - in 2 weeks  Activity - WBAT  Disposition - Home  Condition Upon Discharge - Good  D/C Meds - See DC Summary  DVT Prophylaxis - Lovenox and then Aspirin        Discharge Instructions   Call MD / Call 911    Complete by:  As directed   If you experience chest pain or shortness of  breath, CALL 911 and be transported to the hospital emergency room.  If you develope a fever above 101 F, pus (white drainage) or increased drainage or redness at the wound, or calf pain, call your surgeon's office.     Change dressing    Complete by:  As directed   You may change your dressing dressing daily with sterile 4 x 4 inch gauze dressing and paper tape.  Do not submerge the incision under water.     Constipation Prevention    Complete by:  As directed   Drink plenty of fluids.  Prune juice may be helpful.  You may use a stool softener, such as Colace (over the counter) 100 mg twice a day.  Use MiraLax (over the counter) for constipation as needed.     Diet - low sodium heart healthy    Complete by:  As directed      Discharge instructions    Complete by:  As directed   Pick up stool softner and laxative for home. Do not submerge incision under water. May shower. Continue to use ice for pain and swelling from surgery.  Total Hip Protocol.  Lovenox for eight more days, then start taking a full dose 325 mg Aspirin twice a day for three weeks. After the three weeks of 325 mg Aspirin, then resume the daily 81 mg Aspirin.     Do not sit on low chairs, stoools or toilet seats, as it may be difficult to get up from low surfaces    Complete by:  As directed      Driving restrictions    Complete by:  As directed  No driving until released by the physician.     Increase activity slowly as tolerated    Complete by:  As directed      Lifting restrictions    Complete by:  As directed   No lifting until released by the physician.     Patient may shower    Complete by:  As directed   You may shower without a dressing once there is no drainage.  Do not wash over the wound.  If drainage remains, do not shower until drainage stops.     TED hose    Complete by:  As directed   Use stockings (TED hose) for 3 weeks on both leg(s).  You may remove them at night for sleeping.     Weight  bearing as tolerated    Complete by:  As directed             Medication List    STOP taking these medications       ASPIRIN PO     NON FORMULARY      TAKE these medications       diltiazem 240 MG 24 hr capsule  Commonly known as:  CARDIZEM CD  Take 240 mg by mouth at bedtime.     enoxaparin 40 MG/0.4ML injection  Commonly known as:  LOVENOX  Inject 0.4 mLs (40 mg total) into the skin daily.     furosemide 40 MG tablet  Commonly known as:  LASIX  TAKE 1 TABLET BY MOUTH EVERY DAY     loteprednol 0.2 % Susp  Commonly known as:  LOTEMAX  Place 1 drop into the left eye daily as needed (inflammation).     ALREX 0.2 % Susp  Generic drug:  loteprednol  1 drop daily.     methocarbamol 500 MG tablet  Commonly known as:  ROBAXIN  Take 1 tablet (500 mg total) by mouth every 6 (six) hours as needed for muscle spasms.     metoprolol succinate 50 MG 24 hr tablet  Commonly known as:  TOPROL-XL  Take 25-50 mg by mouth 2 (two) times daily. Generic doesn't work. Takes 5m in am, 511min pm.     oxyCODONE 5 MG immediate release tablet  Commonly known as:  Oxy IR/ROXICODONE  Take 1-2 tablets (5-10 mg total) by mouth every 3 (three) hours as needed for moderate pain, severe pain or breakthrough pain.     potassium chloride 10 MEQ tablet  Commonly known as:  K-DUR  Take 10 mEq by mouth daily.     ZANTAC 150 MG tablet  Generic drug:  ranitidine  Take 150 mg by mouth 2 (two) times daily.       Follow-up Information   Follow up with [facility]. Schedule an appointment as soon as possible for a visit on 08/17/2014. (Call [phone] to make the appointment)    Specialty:  Orthopedic Surgery   Contact information:   [address] [phone]     [facility]    Contact information:   [address] [place] [phone]       [facility] up  with [facility] and [facility])    Contact information:   [address] [place] [phone]     Follow up with ALGearlean AlfMD. Schedule an appointment as soon  as possible for a visit on 08/19/2014.   Specialty:  Orthopedic Surgery   Contact information:   [address] [place] [address] [phone]       Signed: Arlee Muslim, PA-C Orthopaedic Surgery 08/06/2014, 9:01 AM

## 2014-08-06 NOTE — Progress Notes (Signed)
Physical Therapy Treatment Patient Details Name: Victoria Delgado MRN: [id number] DOB: September 17, 1941 Today's Date: 08/06/2014    History of Present Illness s/p right  DA THA;    PT Comments    **Stair training with pt's husband and pt completed. REady to DC home from PT standpoint. *  Follow Up Recommendations  Home health PT     Equipment Recommendations  Rolling walker with 5" wheels    Recommendations for Other Services       Precautions / Restrictions Restrictions Weight Bearing Restrictions: No    Mobility                Transfers Overall transfer level: Needs assistance Equipment used: Rolling walker (2 wheeled) Transfers: Sit to/from Stand Sit to Stand: Supervision         General transfer comment: verbal cues hand placement, pt able to self correct  Ambulation/Gait   Ambulation Distance (Feet): 120 Feet Assistive device: Rolling walker (2 wheeled) Gait Pattern/deviations: Step-through pattern   Gait velocity interpretation: Below normal speed for age/gender General Gait Details: steady with RW, good sequencing, cues to lift head   Stairs Stairs: Yes Stairs assistance: Min assist Stair Management: With walker;Backwards;Step to pattern;No rails Number of Stairs: 4 General stair comments: husband present, he assisted with management of RW, pt had good sequencing  Wheelchair Mobility    Modified Rankin (Stroke Patients Only)       Balance                                    Cognition Arousal/Alertness: Awake/alert Behavior During Therapy: WFL for tasks assessed/performed Overall Cognitive Status: Within Functional Limits for tasks assessed                      Exercises     General Comments        Pertinent Vitals/Pain Pain Score: 5  Pain Location: R hip and knee with walking Pain Descriptors / Indicators: Sore Pain Intervention(s): Ice applied;Monitored during session;Premedicated before session     Home Living                      Prior Function            PT Goals (current goals can now be found in the care plan section) Acute Rehab PT Goals Patient Stated Goal: decr pain, get better and come  back for left hip surgery PT Goal Formulation: With patient Time For Goal Achievement: 08/10/14 Potential to Achieve Goals: Good Progress towards PT goals: Goals met/education completed, patient discharged from PT    Frequency  7X/week    PT Plan Current plan remains appropriate    Co-evaluation             End of Session Equipment Utilized During Treatment: Gait belt Activity Tolerance: Patient tolerated treatment well Patient left: with call bell/phone within reach;in chair     Time: 1056-1106 PT Time Calculation (min): 10 min  Charges:  $Gait Training: 8-22 mins                    G Codes:      Philomena Doheny 08/06/2014, 11:16 AM

## 2014-08-06 NOTE — Progress Notes (Signed)
Physical Therapy Treatment Patient Details Name: Victoria Delgado MRN: [id number] DOB: Aug 29, 1941 Today's Date: 08/06/2014    History of Present Illness s/p right  DA THA;    PT Comments    **Very good progress with mobility. Pt walked 140' with RW, performed THA exercises with min A. Continues to report R knee pain with walking, pt stated ice relieves that pain some. Will do stair training later this morning when pt's husband arrives.  *  Follow Up Recommendations  Home health PT     Equipment Recommendations  Rolling walker with 5" wheels    Recommendations for Other Services       Precautions / Restrictions Restrictions Weight Bearing Restrictions: No    Mobility  Bed Mobility Overal bed mobility: Modified Independent Bed Mobility: Supine to Sit     Supine to sit: HOB elevated     General bed mobility comments: HOB up 25*  Transfers Overall transfer level: Needs assistance Equipment used: Rolling walker (2 wheeled) Transfers: Sit to/from Stand Sit to Stand: Supervision         General transfer comment: verbal cues hand placement, pt able to self correct  Ambulation/Gait   Ambulation Distance (Feet): 140 Feet Assistive device: Rolling walker (2 wheeled) Gait Pattern/deviations: Step-through pattern   Gait velocity interpretation: Below normal speed for age/gender General Gait Details: steady with RW, good sequencing, cues to lift head   Stairs Stairs: Yes Stairs assistance: Min assist Stair Management: No rails;With walker;Backwards Number of Stairs: 2 General stair comments: min A to manage RW, cues for sequencing  Wheelchair Mobility    Modified Rankin (Stroke Patients Only)       Balance                                    Cognition Arousal/Alertness: Awake/alert Behavior During Therapy: WFL for tasks assessed/performed Overall Cognitive Status: Within Functional Limits for tasks assessed                       Exercises Total Joint Exercises Ankle Circles/Pumps: AROM;Both;10 reps Quad Sets: AROM;Both;10 reps Heel Slides: AAROM;Right;10 reps;Supine Hip ABduction/ADduction: AAROM;Right;10 reps Straight Leg Raises: AAROM;Right;10 reps;Supine    General Comments        Pertinent Vitals/Pain Pain Score: 5  Pain Location: R hip and knee with walking Pain Descriptors / Indicators: Sore Pain Intervention(s): Ice applied;Monitored during session;Premedicated before session    Home Living                      Prior Function            PT Goals (current goals can now be found in the care plan section) Acute Rehab PT Goals Patient Stated Goal: decr pain, get better and come  back for left hip surgery PT Goal Formulation: With patient Time For Goal Achievement: 08/10/14 Potential to Achieve Goals: Good Progress towards PT goals: Goals met/education completed, patient discharged from PT    Frequency  7X/week    PT Plan Current plan remains appropriate    Co-evaluation             End of Session Equipment Utilized During Treatment: Gait belt Activity Tolerance: Patient tolerated treatment well Patient left: with call bell/phone within reach;in chair     Time: 0488-8916 PT Time Calculation (min): 31 min  Charges:  $Gait Training: 8-22 mins $Therapeutic Exercise: 8-22  mins                    G Codes:      Philomena Doheny 08/06/2014, 11:13 AM

## 2014-08-24 ENCOUNTER — Ambulatory Visit: Payer: Medicare Other | Attending: Orthopedic Surgery | Admitting: Physical Therapy

## 2014-08-24 DIAGNOSIS — M25669 Stiffness of unspecified knee, not elsewhere classified: Secondary | ICD-10-CM | POA: Insufficient documentation

## 2014-08-24 DIAGNOSIS — M25559 Pain in unspecified hip: Secondary | ICD-10-CM | POA: Insufficient documentation

## 2014-08-24 DIAGNOSIS — Z96649 Presence of unspecified artificial hip joint: Secondary | ICD-10-CM | POA: Insufficient documentation

## 2014-08-24 DIAGNOSIS — R609 Edema, unspecified: Secondary | ICD-10-CM | POA: Insufficient documentation

## 2014-08-24 DIAGNOSIS — IMO0001 Reserved for inherently not codable concepts without codable children: Secondary | ICD-10-CM | POA: Insufficient documentation

## 2014-08-24 DIAGNOSIS — R262 Difficulty in walking, not elsewhere classified: Secondary | ICD-10-CM | POA: Insufficient documentation

## 2014-08-25 ENCOUNTER — Ambulatory Visit: Payer: Medicare Other | Admitting: Physical Therapy

## 2014-08-25 DIAGNOSIS — IMO0001 Reserved for inherently not codable concepts without codable children: Secondary | ICD-10-CM | POA: Diagnosis not present

## 2014-08-30 ENCOUNTER — Ambulatory Visit: Payer: Medicare Other | Admitting: Physical Therapy

## 2014-08-30 DIAGNOSIS — IMO0001 Reserved for inherently not codable concepts without codable children: Secondary | ICD-10-CM | POA: Diagnosis not present

## 2014-09-01 ENCOUNTER — Ambulatory Visit: Payer: Medicare Other | Admitting: Physical Therapy

## 2014-09-01 DIAGNOSIS — IMO0001 Reserved for inherently not codable concepts without codable children: Secondary | ICD-10-CM | POA: Diagnosis not present

## 2014-09-02 ENCOUNTER — Ambulatory Visit (INDEPENDENT_AMBULATORY_CARE_PROVIDER_SITE_OTHER): Payer: Medicare Other | Admitting: Family Medicine

## 2014-09-02 VITALS — BP 132/60 | HR 72 | Temp 98.2°F | Resp 16 | Ht 68.0 in | Wt 250.0 lb

## 2014-09-02 DIAGNOSIS — R109 Unspecified abdominal pain: Secondary | ICD-10-CM

## 2014-09-02 DIAGNOSIS — L02211 Cutaneous abscess of abdominal wall: Secondary | ICD-10-CM

## 2014-09-02 DIAGNOSIS — L089 Local infection of the skin and subcutaneous tissue, unspecified: Secondary | ICD-10-CM

## 2014-09-02 DIAGNOSIS — L723 Sebaceous cyst: Secondary | ICD-10-CM

## 2014-09-02 MED ORDER — DOXYCYCLINE HYCLATE 100 MG PO CAPS: 100.0000 mg | ORAL_CAPSULE | Freq: Two times a day (BID) | ORAL | Status: DC

## 2014-09-02 NOTE — Progress Notes (Signed)
Subjective:    Patient ID: Victoria Delgado, female    DOB: 26-Apr-1941, 73 y.o.   MRN: [id number] This chart was scribed for Nilda SimmerKristi Mariachristina Holle, MD by Evon Slackerrance Branch, ED Scribe. This Patient was seen in room 04 and the patients care was started at 6:43 PM  09/02/2014  Abscess   Abscess Pertinent negatives include no chills, diaphoresis, fever or rash.   HPI Comments: Victoria Delgado is a 73 y.o. female who presents to the [facility] complaining of abscess on the LUQ of her abdomen onset 3 days ago.She states she has a Hx of cyst in this same area that required I&D several years ago. She states she has associated color change and that this area is very tender to touch. She states over the last few days it has progressively worsened. She states she has applied a hot compress with no relief. She states that she recently had a new hip replacement. She denies fever, chills or other related symptoms.      Review of Systems  Constitutional: Negative for fever, chills and diaphoresis.  Skin: Positive for color change. Negative for pallor and rash.    Past Medical History  Diagnosis Date  . Cataract     corrective surgery done  . Hypertension   . GERD (gastroesophageal reflux disease)   . Hyperlipidemia   . Sleep apnea     CPAP  . Hernia, hiatal   . Heart palpitations   . Arthritis     osteoarthritis-hips,knees, back, hands  . Hepatitis     past hx.,many yrs ago ? food source   Past Surgical History  Procedure Laterality Date  . Appendectomy  2004  . Cholecystectomy    . Retinal detachment surgery Bilateral     remains with slight hazy vision with lights  . Cataract extraction w/ intraocular lens implant      both eyes  . 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 Codeine Nausea  Only  . Kenalog [Triamcinolone Acetonide]     Had A.fib after she received the injection."Steroid meds"  . Relafen [Nabumetone]     Edema   . Statins     Possible myositis   . Tramadol     Auditory halllucinations  . Penicillins Rash    Years ago, injection site was red and swollen.   Current Outpatient Prescriptions  Medication Sig Dispense Refill  . aspirin 325 MG EC tablet Take 650 mg by mouth daily.      Marland Kitchen. diltiazem (CARDIZEM CD) 240 MG 24 hr capsule Take 240 mg by mouth at bedtime.      . furosemide (LASIX) 40 MG tablet TAKE 1 TABLET BY MOUTH EVERY DAY  90 tablet  0  . metoprolol succinate (TOPROL-XL) 50 MG 24 hr tablet Take 25-50 mg by mouth 2 (two) times daily. Generic doesn't work. Takes 25mg  in am, 50mg  in pm.      . potassium chloride (K-DUR) 10 MEQ tablet Take 10 mEq by mouth daily.      . ranitidine (ZANTAC) 150 MG tablet Take 150 mg by mouth 2 (two) times daily.      Marland Kitchen. doxycycline (VIBRAMYCIN) 100 MG capsule Take 1 capsule (100 mg total) by mouth 2 (two) times daily.  20 capsule  0  . loteprednol (  ALREX) 0.2 % SUSP 1 drop daily.      Marland Kitchen loteprednol (LOTEMAX) 0.2 % SUSP Place 1 drop into the left eye daily as needed (inflammation).      . methocarbamol (ROBAXIN) 500 MG tablet Take 1 tablet (500 mg total) by mouth every 6 (six) hours as needed for muscle spasms.  80 tablet  0  . oxyCODONE (OXY IR/ROXICODONE) 5 MG immediate release tablet Take 1-2 tablets (5-10 mg total) by mouth every 3 (three) hours as needed for moderate pain, severe pain or breakthrough pain.  80 tablet  0   No current facility-administered medications for this visit.       Objective:    BP 132/60  Pulse 72  Temp(Src) 98.2 F (36.8 C) (Oral)  Resp 16  Ht 5\' 8"  (1.727 m)  Wt 250 lb (113.399 kg)  BMI 38.02 kg/m2  SpO2 95%  Physical Exam  Nursing note and vitals reviewed. Constitutional: She is oriented to person, place, and time. She appears well-developed and well-nourished. No distress.    HENT:  Head: Normocephalic and atraumatic.  Eyes: Conjunctivae and EOM are normal.  Neck: Neck supple.  Pulmonary/Chest: Effort normal. No respiratory distress.  Musculoskeletal: Normal range of motion.  Neurological: She is alert and oriented to person, place, and time.  Skin: Skin is warm and dry. She is not diaphoretic.  1cm cystic lesion on LUQ of abdomen, 3cm of induration and fluctuance, 4x5cm of erythema   Psychiatric: She has a normal mood and affect. Her behavior is normal.   I&D completed; wound culture obtained.     Assessment & Plan:   1. Abdominal wall pain   2. Abscess of abdominal wall   3. Infected sebaceous cyst     1. Abdominal wall pain: New. Secondary to abscess/sebaceous cyst infection.  Recommend continuing Tylenol PRN. 2.  Infected sebaceous cyst with abscess: New. S/p I&D with packing in the office.  Wound culture obtained; rx for Doxycycline provided.  RTC 48 hours for wound recheck.  RTC sooner for fever, increasing pain, increasing redness, increasing swelling.   Meds ordered this encounter  Medications  . aspirin 325 MG EC tablet    Sig: Take 650 mg by mouth daily.  Marland Kitchen doxycycline (VIBRAMYCIN) 100 MG capsule    Sig: Take 1 capsule (100 mg total) by mouth 2 (two) times daily.    Dispense:  20 capsule    Refill:  0    No Follow-up on file.   I personally performed the services described in this documentation, which was scribed in my presence.  The recorded information has been reviewed and is accurate.  Nilda Simmer, M.D.  [facility] [address] [place], [place]  [address] [phone] phone [phone] fax

## 2014-09-02 NOTE — Progress Notes (Signed)
Procedure:  Consent obtained.  Local anesthesia with 2% lido.  Betadine prep.  #11 blade used to make a 1.5 cm horizontal incision into the area.  Purulent and sebaceous material expressed.  1/4in plain packing placed into the wound cavity.  Drsg placed.  Wound care d/w pt.

## 2014-09-04 ENCOUNTER — Ambulatory Visit (INDEPENDENT_AMBULATORY_CARE_PROVIDER_SITE_OTHER): Payer: Medicare Other | Admitting: Physician Assistant

## 2014-09-04 VITALS — BP 118/68 | HR 61 | Temp 98.2°F | Resp 20 | Ht 68.0 in | Wt 250.0 lb

## 2014-09-04 DIAGNOSIS — L02211 Cutaneous abscess of abdominal wall: Secondary | ICD-10-CM

## 2014-09-04 NOTE — Patient Instructions (Signed)
Continue the antibiotic as prescribed. Apply a warm compress to the area for 15-20 minutes 2-4 times each day. Change the dressing if it becomes saturated, leaks or falls off.  

## 2014-09-05 LAB — WOUND CULTURE: GRAM STAIN: NONE SEEN

## 2014-09-05 NOTE — Progress Notes (Signed)
   Subjective:    Patient ID: Victoria Delgado, female    DOB: [birth year], 73 y.o.   MRN: [id number]   PCP: Levon Hedger, MD  Chief Complaint  Patient presents with  . wound care    HPI  Presents for wound care after I&D of an infected sebaceous cyst 09/02/2014. She's tolerating the doxycycline without difficulty. Reduced pain. No fever/chills. Dressing changes as needed.  Review of Systems     Objective:   Physical Exam  BP 118/68  Pulse 61  Temp(Src) 98.2 F (36.8 C) (Oral)  Resp 20  Ht 5\' 8"  (1.727 m)  Wt 250 lb (113.399 kg)  BMI 38.02 kg/m2  SpO2 97% WDWNWF, A&O x3. Dressing and packing removed.  Beefy red wound base. Small amount of cyst sac wall visible and removed with pickups. Gently repacked with a small amount of 1/4 in packing. Dressing placed.      Assessment & Plan:  1. Abscess of abdominal wall Continue antibiotic, dressing changes, warm compresses. RTC 48-72 hours for re-evaluation. Anticipate no additional packing will be needed.   Fernande Bras, PA-C Physician Assistant-Certified [facility]

## 2014-09-06 ENCOUNTER — Ambulatory Visit (INDEPENDENT_AMBULATORY_CARE_PROVIDER_SITE_OTHER): Payer: Medicare Other | Admitting: Physician Assistant

## 2014-09-06 ENCOUNTER — Ambulatory Visit: Payer: Medicare Other | Attending: Orthopedic Surgery | Admitting: Physical Therapy

## 2014-09-06 VITALS — BP 116/64 | HR 65 | Temp 97.9°F | Resp 16

## 2014-09-06 DIAGNOSIS — M25651 Stiffness of right hip, not elsewhere classified: Secondary | ICD-10-CM | POA: Diagnosis not present

## 2014-09-06 DIAGNOSIS — I10 Essential (primary) hypertension: Secondary | ICD-10-CM | POA: Diagnosis not present

## 2014-09-06 DIAGNOSIS — R609 Edema, unspecified: Secondary | ICD-10-CM | POA: Insufficient documentation

## 2014-09-06 DIAGNOSIS — M25551 Pain in right hip: Secondary | ICD-10-CM | POA: Diagnosis not present

## 2014-09-06 DIAGNOSIS — Z96641 Presence of right artificial hip joint: Secondary | ICD-10-CM | POA: Insufficient documentation

## 2014-09-06 DIAGNOSIS — R262 Difficulty in walking, not elsewhere classified: Secondary | ICD-10-CM | POA: Insufficient documentation

## 2014-09-06 DIAGNOSIS — L02211 Cutaneous abscess of abdominal wall: Secondary | ICD-10-CM

## 2014-09-06 NOTE — Progress Notes (Signed)
   Subjective:    Patient ID: Victoria Delgado, female    DOB: [birth year], 73 y.o.   MRN: [id number]  HPI  Presents today for follow up wound care. Initially had sebaceous cyst abscess I&D and packing on 09/02/14. She returned for wound care on 09/04/14 and had the wound re-packed. She has been taking her doxycycline as prescribed. Changing her dressings every morning. She has been putting warm compresses on the site daily.   Review of Systems    No fever, chills, CP, SOB.   Objective:   Physical Exam  BP 116/64  Pulse 65  Temp(Src) 97.9 F (36.6 C) (Oral)  Resp 16  SpO2 95%  General: Seated on exam bed in no apparent distress. Wound: Previously I&Dd site on right abdomen. Dressing removed, packing taken out. Wound bed is beefy red with granulation tissue. 1.5 cm induration surrounding 1 mm incision hole. Erythema over indurated area has regressed since 10/3. No purulence. Small sac wall remnant removed with pickups.      Assessment & Plan:   Abscess of abdominal wall  Infected sebaceous cyst S/P I&D.   Wound healing appropriately with no signs of infection. Persistent induration is consistent with healing. No packing placed today. Gauze placed over wound. Instructed to continue to apply warm compress to site daily until doxycycline is finished. Patient instructed to RTC with any signs of infection or concerns.

## 2014-09-06 NOTE — Progress Notes (Signed)
I have examined this patient along with Mr. McVeigh, PA-C and agree.  

## 2014-09-08 ENCOUNTER — Ambulatory Visit: Payer: Medicare Other | Admitting: Physical Therapy

## 2014-09-08 DIAGNOSIS — M25551 Pain in right hip: Secondary | ICD-10-CM | POA: Diagnosis not present

## 2014-09-29 ENCOUNTER — Other Ambulatory Visit: Payer: Self-pay | Admitting: Internal Medicine

## 2014-09-29 NOTE — Telephone Encounter (Signed)
Refill request

## 2014-10-23 ENCOUNTER — Other Ambulatory Visit: Payer: Self-pay | Admitting: Internal Medicine

## 2014-10-25 ENCOUNTER — Ambulatory Visit: Payer: Self-pay | Admitting: Orthopedic Surgery

## 2014-10-25 NOTE — Telephone Encounter (Signed)
Refill request

## 2014-12-11 DIAGNOSIS — G4733 Obstructive sleep apnea (adult) (pediatric): Secondary | ICD-10-CM | POA: Diagnosis not present

## 2014-12-23 ENCOUNTER — Ambulatory Visit: Payer: Self-pay | Admitting: Orthopedic Surgery

## 2014-12-23 NOTE — Progress Notes (Signed)
Preoperative surgical orders have been place into the Epic hospital system for Victoria Delgado on 12/23/2014, 5:27 PM  by Patrica Duel for surgery on 01-05-2015.  Preop Total Hip - Anterior Approach orders including Experel Injecion, IV Tylenol  as long as there are no contraindications to the above medications. Avel Peace, PA-C

## 2014-12-24 NOTE — Progress Notes (Signed)
12-24-14 1240 -" Victoria Delgado" reviewed your note, but patient has conflicting antibiotic order and Hip xray order from 10-25-14 in Epic. Please clarify what is desired. Thanks

## 2014-12-27 ENCOUNTER — Ambulatory Visit (HOSPITAL_COMMUNITY)
Admission: RE | Admit: 2014-12-27 | Discharge: 2014-12-27 | Disposition: A | Discharge status: Home / Self Care | Payer: Medicare Other | Source: Ambulatory Visit | Attending: Orthopedic Surgery | Admitting: Orthopedic Surgery

## 2014-12-27 ENCOUNTER — Encounter (HOSPITAL_COMMUNITY): Payer: Self-pay

## 2014-12-27 ENCOUNTER — Encounter (HOSPITAL_COMMUNITY)
Admission: RE | Admit: 2014-12-27 | Discharge: 2014-12-27 | Disposition: A | Discharge status: Home / Self Care | Payer: Medicare Other | Source: Ambulatory Visit | Attending: Orthopedic Surgery | Admitting: Orthopedic Surgery

## 2014-12-27 DIAGNOSIS — M1612 Unilateral primary osteoarthritis, left hip: Secondary | ICD-10-CM | POA: Insufficient documentation

## 2014-12-27 DIAGNOSIS — Z96641 Presence of right artificial hip joint: Secondary | ICD-10-CM | POA: Diagnosis not present

## 2014-12-27 DIAGNOSIS — M25552 Pain in left hip: Secondary | ICD-10-CM | POA: Diagnosis present

## 2014-12-27 LAB — CBC
HEMATOCRIT: 42.9 % (ref 36.0–46.0)
HEMOGLOBIN: 14 g/dL (ref 12.0–15.0)
MCH: 29.2 pg (ref 26.0–34.0)
MCHC: 32.6 g/dL (ref 30.0–36.0)
MCV: 89.4 fL (ref 78.0–100.0)
PLATELETS: 240 10*3/uL (ref 150–400)
RBC: 4.8 MIL/uL (ref 3.87–5.11)
RDW: 14 % (ref 11.5–15.5)
WBC: 6 10*3/uL (ref 4.0–10.5)

## 2014-12-27 LAB — COMPREHENSIVE METABOLIC PANEL
ALT: 19 U/L (ref 0–35)
AST: 21 U/L (ref 0–37)
Albumin: 4 g/dL (ref 3.5–5.2)
Alkaline Phosphatase: 90 U/L (ref 39–117)
Anion gap: 8 (ref 5–15)
BILIRUBIN TOTAL: 0.5 mg/dL (ref 0.3–1.2)
BUN: 13 mg/dL (ref 6–23)
CO2: 28 mmol/L (ref 19–32)
Calcium: 9.3 mg/dL (ref 8.4–10.5)
Chloride: 107 mmol/L (ref 96–112)
Creatinine, Ser: 0.91 mg/dL (ref 0.50–1.10)
GFR calc Af Amer: 72 mL/min — ABNORMAL LOW (ref >90)
GFR, EST NON AFRICAN AMERICAN: 62 mL/min — AB (ref >90)
GLUCOSE: 127 mg/dL — AB (ref 70–99)
POTASSIUM: 4.8 mmol/L (ref 3.5–5.1)
Sodium: 143 mmol/L (ref 135–145)
TOTAL PROTEIN: 7 g/dL (ref 6.0–8.3)

## 2014-12-27 LAB — SURGICAL PCR SCREEN
MRSA, PCR: NEGATIVE
Staphylococcus aureus: NEGATIVE

## 2014-12-27 LAB — URINALYSIS, ROUTINE W REFLEX MICROSCOPIC
BILIRUBIN URINE: NEGATIVE
GLUCOSE, UA: NEGATIVE mg/dL
Hgb urine dipstick: NEGATIVE
Ketones, ur: NEGATIVE mg/dL
NITRITE: NEGATIVE
PROTEIN: NEGATIVE mg/dL
Specific Gravity, Urine: 1.021 (ref 1.005–1.030)
UROBILINOGEN UA: 1 mg/dL (ref 0.0–1.0)
pH: 7 (ref 5.0–8.0)

## 2014-12-27 LAB — URINE MICROSCOPIC-ADD ON

## 2014-12-27 LAB — APTT: aPTT: 31 seconds (ref 24–37)

## 2014-12-27 LAB — PROTIME-INR
INR: 1.03 (ref 0.00–1.49)
Prothrombin Time: 13.6 seconds (ref 11.6–15.2)

## 2014-12-27 NOTE — Progress Notes (Signed)
12-27-14 1025 AM -note urinalysis in Epic.note to pod [phone].

## 2014-12-27 NOTE — Patient Instructions (Addendum)
22 Victoria Delgado  12/27/2014 Wednesday  Your procedure is scheduled on:   01-05-2015 Wednesday  Enter through [facility]  Entrance and follow signs to [facility]. Arrive at    1100 AM(01-04-15 verified per phone with pt)..  Call this number if you have problems the morning of surgery: [phone]  Or Presurgical Testing [phone].   For Living Will and/or Health Care Power Attorney Forms: please provide copy for your medical record,may bring AM of surgery(Forms should be already notarized -we do not provide this service).(12-27-14  No information preferred today).  For Cpap use: Bring mask and tubing only.   Do not eat food/ or drink: After Midnight.  Exception: may have clear liquids:up to 6 Hours before arrival. Nothing after: 0700 am  Clear liquids include soda, tea, black coffee, apple or grape juice, broth.  Take these medicines the morning of surgery with A SIP OF WATER: Metoprolol.Ranitidine.   Do not wear jewelry, make-up or nail polish.  Do not wear deodorant, lotions, powders, or perfumes.   Do not shave legs and under arms- 48 hours(2 days) prior to first CHG shower.(Shaving face and neck okay.)  Do not bring valuables to the hospital.(Hospital is not responsible for lost valuables).  Contacts, dentures or removable bridgework, body piercing, hair pins may not be worn into surgery.  Leave suitcase in the car. After surgery it may be brought to your room.  For patients admitted to the hospital, checkout time is 11:00 AM the day of discharge.(Restricted visitors-Any Persons displaying flu-like symptoms or illness).    Patients discharged the day of surgery will not be allowed to drive home. Must have responsible person with you x 24 hours once discharged.  Name and phone number of your driver: Peyton Najjar spouse- [phone]     Please read over the following fact sheets that you were given:  CHG(Chlorhexidine Gluconate 4% Surgical Soap) use, MRSA Information, Blood  Transfusion fact sheet, Incentive Spirometry Instruction.  Remember : Type/Screen "Blue armbands" - may not be removed once applied(would result in being retested AM of surgery, if removed).         [place] - Preparing for Surgery Before surgery, you can play an important role.  Because skin is not sterile, your skin needs to be as free of germs as possible.  You can reduce the number of germs on your skin by washing with CHG (chlorahexidine gluconate) soap before surgery.  CHG is an antiseptic cleaner which kills germs and bonds with the skin to continue killing germs even after washing. Please DO NOT use if you have an allergy to CHG or antibacterial soaps.  If your skin becomes reddened/irritated stop using the CHG and inform your nurse when you arrive at Short Stay. Do not shave (including legs and underarms) for at least 48 hours prior to the first CHG shower.  You may shave your face/neck. Please follow these instructions carefully:  1.  Shower with CHG Soap the night before surgery and the  morning of Surgery.  2.  If you choose to wash your hair, wash your hair first as usual with your  normal  shampoo.  3.  After you shampoo, rinse your hair and body thoroughly to remove the  shampoo.                           4.  Use CHG as you would any other liquid soap.  You can apply  chg directly  to the skin and wash                       Gently with a scrungie or clean washcloth.  5.  Apply the CHG Soap to your body ONLY FROM THE NECK DOWN.   Do not use on face/ open                           Wound or open sores. Avoid contact with eyes, ears mouth and genitals (private parts).                       Wash face,  Genitals (private parts) with your normal soap.             6.  Wash thoroughly, paying special attention to the area where your surgery  will be performed.  7.  Thoroughly rinse your body with warm water from the neck down.  8.  DO NOT shower/wash with your normal soap after using and  rinsing off  the CHG Soap.                9.  Pat yourself dry with a clean towel.            10.  Wear clean pajamas.            11.  Place clean sheets on your bed the night of your first shower and do not  sleep with pets. Day of Surgery : Do not apply any lotions/deodorants the morning of surgery.  Please wear clean clothes to the hospital/surgery center.  FAILURE TO FOLLOW THESE INSTRUCTIONS MAY RESULT IN THE CANCELLATION OF YOUR SURGERY PATIENT SIGNATURE_________________________________  NURSE SIGNATURE__________________________________  ________________________________________________________________________   Rogelia Mire  An incentive spirometer is a tool that can help keep your lungs clear and active. This tool measures how well you are filling your lungs with each breath. Taking long deep breaths may help reverse or decrease the chance of developing breathing (pulmonary) problems (especially infection) following:  A long period of time when you are unable to move or be active. BEFORE THE PROCEDURE   If the spirometer includes an indicator to show your best effort, your nurse or respiratory therapist will set it to a desired goal.  If possible, sit up straight or lean slightly forward. Try not to slouch.  Hold the incentive spirometer in an upright position. INSTRUCTIONS FOR USE   Sit on the edge of your bed if possible, or sit up as far as you can in bed or on a chair.  Hold the incentive spirometer in an upright position.  Breathe out normally.  Place the mouthpiece in your mouth and seal your lips tightly around it.  Breathe in slowly and as deeply as possible, raising the piston or the ball toward the top of the column.  Hold your breath for 3-5 seconds or for as long as possible. Allow the piston or ball to fall to the bottom of the column.  Remove the mouthpiece from your mouth and breathe out normally.  Rest for a few seconds and repeat Steps 1  through 7 at least 10 times every 1-2 hours when you are awake. Take your time and take a few normal breaths between deep breaths.  The spirometer may include an indicator to show your best effort. Use the indicator as a goal to work toward during each  repetition.  After each set of 10 deep breaths, practice coughing to be sure your lungs are clear. If you have an incision (the cut made at the time of surgery), support your incision when coughing by placing a pillow or rolled up towels firmly against it. Once you are able to get out of bed, walk around indoors and cough well. You may stop using the incentive spirometer when instructed by your caregiver.  RISKS AND COMPLICATIONS  Take your time so you do not get dizzy or light-headed.  If you are in pain, you may need to take or ask for pain medication before doing incentive spirometry. It is harder to take a deep breath if you are having pain. AFTER USE  Rest and breathe slowly and easily.  It can be helpful to keep track of a log of your progress. Your caregiver can provide you with a simple table to help with this. If you are using the spirometer at home, follow these instructions: [place] IF:   You are having difficultly using the spirometer.  You have trouble using the spirometer as often as instructed.  Your pain medication is not giving enough relief while using the spirometer.  You develop fever of 100.5 F (38.1 C) or higher. SEEK IMMEDIATE MEDICAL CARE IF:   You cough up bloody sputum that had not been present before.  You develop fever of 102 F (38.9 C) or greater.  You develop worsening pain at or near the incision site. MAKE SURE YOU:   Understand these instructions.  Will watch your condition.  Will get help right away if you are not doing well or get worse. Document Released: 04/01/2007 Document Revised: 02/11/2012 Document Reviewed: 06/02/2007 Exit[employer] Patient Information 2014 ExitCare,  [place].   ________________________________________________________________________  WHAT IS A BLOOD TRANSFUSION? Blood Transfusion Information  A transfusion is the replacement of blood or some of its parts. Blood is made up of multiple cells which provide different functions.  Red blood cells carry oxygen and are used for blood loss replacement.  White blood cells fight against infection.  Platelets control bleeding.  Plasma helps clot blood.  Other blood products are available for specialized needs, such as hemophilia or other clotting disorders. BEFORE THE TRANSFUSION  Who gives blood for transfusions?   Healthy volunteers who are fully evaluated to make sure their blood is safe. This is blood bank blood. Transfusion therapy is the safest it has ever been in the practice of medicine. Before blood is taken from a donor, a complete history is taken to make sure that person has no history of diseases nor engages in risky social behavior (examples are intravenous drug use or sexual activity with multiple partners). The donor's travel history is screened to minimize risk of transmitting infections, such as malaria. The donated blood is tested for signs of infectious diseases, such as HIV and hepatitis. The blood is then tested to be sure it is compatible with you in order to minimize the chance of a transfusion reaction. If you or a relative donates blood, this is often done in anticipation of surgery and is not appropriate for emergency situations. It takes many days to process the donated blood. RISKS AND COMPLICATIONS Although transfusion therapy is very safe and saves many lives, the main dangers of transfusion include:   Getting an infectious disease.  Developing a transfusion reaction. This is an allergic reaction to something in the blood you were given. Every precaution is taken to prevent this. The  decision to have a blood transfusion has been considered carefully by your caregiver  before blood is given. Blood is not given unless the benefits outweigh the risks. AFTER THE TRANSFUSION  Right after receiving a blood transfusion, you will usually feel much better and more energetic. This is especially true if your red blood cells have gotten low (anemic). The transfusion raises the level of the red blood cells which carry oxygen, and this usually causes an energy increase.  The nurse administering the transfusion will monitor you carefully for complications. HOME CARE INSTRUCTIONS  No special instructions are needed after a transfusion. You may find your energy is better. Speak with your caregiver about any limitations on activity for underlying diseases you may have. SEEK MEDICAL CARE IF:   Your condition is not improving after your transfusion.  You develop redness or irritation at the intravenous (IV) site. SEEK IMMEDIATE MEDICAL CARE IF:  Any of the following symptoms occur over the next 12 hours:  Shaking chills.  You have a temperature by mouth above 102 F (38.9 C), not controlled by medicine.  Chest, back, or muscle pain.  People around you feel you are not acting correctly or are confused.  Shortness of breath or difficulty breathing.  Dizziness and fainting.  You get a rash or develop hives.  You have a decrease in urine output.  Your urine turns a dark color or changes to pink, red, or brown. Any of the following symptoms occur over the next 10 days:  You have a temperature by mouth above 102 F (38.9 C), not controlled by medicine.  Shortness of breath.  Weakness after normal activity.  The white part of the eye turns yellow (jaundice).  You have a decrease in the amount of urine or are urinating less often.  Your urine turns a dark color or changes to pink, red, or brown. Document Released: 11/16/2000 Document Revised: 02/11/2012 Document Reviewed: 07/05/2008 [facility] Patient Information 2014 [place],  [place].  _______________________________________________________________________

## 2014-12-29 ENCOUNTER — Other Ambulatory Visit: Payer: Self-pay

## 2014-12-29 MED ORDER — METOPROLOL SUCCINATE ER 50 MG PO TB24: ORAL_TABLET | ORAL | Status: DC

## 2015-01-04 NOTE — H&P (Signed)
TOTAL HIP ADMISSION H&P  Patient is admitted for left total hip arthroplasty.  Subjective:  Chief Complaint: left hip pain  HPI: Victoria Delgado, 74 y.o. female, has a history of pain and functional disability in the left hip(s) due to arthritis and patient has failed non-surgical conservative treatments for greater than 12 weeks to include NSAID's and/or analgesics, flexibility and strengthening excercises, use of assistive devices and activity modification.  Onset of symptoms was gradual starting 2 years ago with gradually worsening course since that time.The patient noted no past surgery on the left hip(s).  Patient currently rates pain in the left hip at 7 out of 10 with activity. Patient has night pain, worsening of pain with activity and weight bearing, pain that interfers with activities of daily living and pain with passive range of motion. Patient has evidence of periarticular osteophytes and joint space narrowing by imaging studies. This condition presents safety issues increasing the risk of falls. There is no current active infection.  Patient Active Problem List   Diagnosis Date Noted  . OA (osteoarthritis) of hip 08/04/2014  . Edema 08/17/2013  . H/O bone density study 03/16/2013  . 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 Unspecified hereditary and idiopathic peripheral neuropathy 03/05/2013  . Unspecified vitamin D deficiency 03/05/2013  . Fatty infiltration of liver 02/08/2013  . Other and unspecified hyperlipidemia 02/08/2013  . Morbid obesity 02/08/2013  . Cellulitis of groin, right 12/30/2012  . DJD (degenerative joint disease) 12/17/2012  . GERD (gastroesophageal reflux disease) 12/17/2012  . Peripheral neuropathy 12/17/2012  . HOCM (hypertrophic obstructive cardiomyopathy) 10/16/2012  . HTN (hypertension) 10/16/2012  . Sleep apnea 10/16/2012  . Heart palpitations    Past Medical History  Diagnosis Date  . Cataract     corrective surgery done  . Hypertension   .  GERD (gastroesophageal reflux disease)   . Hyperlipidemia   . Sleep apnea     CPAP  . Hernia, hiatal   . Heart palpitations   . Arthritis     osteoarthritis-hips,knees, back, hands  . Hepatitis     past hx.,many yrs ago ? food source    Past Surgical History  Procedure Laterality Date  . Appendectomy  2004  . Cholecystectomy    . Retinal detachment surgery Bilateral     remains with slight hazy vision with lights  . Cataract extraction w/ intraocular lens implant      both eyes  . Total hip arthroplasty Right 08/04/2014    Procedure: RIGHT TOTAL HIP ARTHROPLASTY ANTERIOR APPROACH;  Surgeon: Loanne Drilling, MD;  Location: [facility];  Service: Orthopedics;  Laterality: Right;     Current outpatient prescriptions:  .  acetaminophen (TYLENOL) 500 MG tablet, Take 1,000 mg by mouth every 6 (six) hours as needed for moderate pain., Disp: , Rfl:  .  aspirin EC 81 MG tablet, Take 81 mg by mouth every morning., Disp: , Rfl:  .  diltiazem (CARDIZEM CD) 240 MG 24 hr capsule, Take 240 mg by mouth at bedtime., Disp: , Rfl:  .  furosemide (LASIX) 40 MG tablet, TAKE 1 TABLET BY MOUTH DAILY (Patient taking differently: TAKE 1 TABLET BY MOUTH EVERY MORNING), Disp: 90 tablet, Rfl: 1 .  loteprednol (ALREX) 0.2 % SUSP, Place 1 drop into the left eye daily as needed (irritation.). , Disp: , Rfl:  .  potassium chloride (K-DUR) 10 MEQ tablet, Take 10 mEq by mouth every morning. , Disp: , Rfl:  .  Propylene Glycol (SYSTANE BALANCE OP), Apply 2-3  drops to eye 3 (three) times daily as needed (dryness.)., Disp: , Rfl:  .  ranitidine (ZANTAC) 150 MG tablet, Take 150 mg by mouth 2 (two) times daily., Disp: , Rfl:  .  metoprolol succinate (TOPROL-XL) 50 MG 24 hr tablet, Takes  in am,  in pm.--Name Brand, Disp: 45 tablet, Rfl: 6  Allergies  Allergen Reactions  . Codeine Nausea Only  . Kenalog [Triamcinolone Acetonide]     Had A.fib after she received the injection."Steroid meds"  . Relafen [Nabumetone]      Edema   . Statins     Possible myositis   . Tramadol     Auditory halllucinations  . Penicillins Rash    Years ago, injection site was red and swollen.    History  Substance Use Topics  . Smoking status: Former Games developer  . Smokeless tobacco: Never Used     Comment: QUIT IN 53  . Alcohol Use: No    Family History  Problem Relation Age of Onset  . Cancer Father 63  . Hypertension Mother   . CVA Mother      Review of Systems  Constitutional: Negative.   HENT: Negative.   Eyes: Negative.   Respiratory: Negative.   Cardiovascular: Positive for palpitations. Negative for chest pain, orthopnea, claudication, leg swelling and PND.  Gastrointestinal: Positive for heartburn. Negative for nausea, vomiting, abdominal pain, diarrhea, constipation, blood in stool and melena.  Genitourinary: Negative.   Musculoskeletal: Positive for myalgias, back pain and joint pain. Negative for falls and neck pain.       Left hip pain  Skin: Negative.   Neurological: Negative.   Endo/Heme/Allergies: Negative.   Psychiatric/Behavioral: Negative.     Objective:  Physical Exam  Constitutional: She is oriented to person, place, and time. She appears well-developed. No distress.  Obese  HENT:  Head: Normocephalic and atraumatic.  Right Ear: External ear normal.  Left Ear: External ear normal.  Nose: Nose normal.  Mouth/Throat: Oropharynx is clear and moist.  Eyes: Conjunctivae and EOM are normal.  Neck: Normal range of motion. Neck supple.  Cardiovascular: Normal rate and intact distal pulses.  A regularly irregular rhythm present.  Murmur heard.  Systolic murmur is present with a grade of 3/6  Respiratory: Effort normal and breath sounds normal. No respiratory distress. She has no wheezes.  GI: Soft. She exhibits no distension. There is no tenderness.  Musculoskeletal:       Right hip: Normal.       Left hip: She exhibits decreased range of motion and decreased strength.       Right  knee: Normal.       Left knee: Normal.       Right lower leg: She exhibits no tenderness and no swelling.       Left lower leg: She exhibits no tenderness and no swelling.  The right hip can be flexed to 120, rotate in 30, out 40 and abduct 40 without discomfort. The left hip flexes to 90, with minimal internal rotation, about 20 external rotation and 20 abduction.  Neurological: She is alert and oriented to person, place, and time. She has normal strength and normal reflexes. No sensory deficit.  Skin: No rash noted. She is not diaphoretic. No erythema.  Psychiatric: She has a normal mood and affect. Her behavior is normal.   Vitals  Weight: 289 lb Height: 68.25in Body Surface Area: 2.4 m Body Mass Index: 43.62 kg/m  Pulse: 65 (Regular)  BP: 131/68 (Sitting, Left  Arm, Standard)   Imaging Review Plain radiographs demonstrate severe degenerative joint disease of the left hip(s). The bone quality appears to be good for age and reported activity level.  Assessment/Plan:  End stage arthritis, left hip(s)  The patient history, physical examination, clinical judgement of the provider and imaging studies are consistent with end stage degenerative joint disease of the left hip(s) and total hip arthroplasty is deemed medically necessary. The treatment options including medical management, injection therapy, arthroscopy and arthroplasty were discussed at length. The risks and benefits of total hip arthroplasty were presented and reviewed. The risks due to aseptic loosening, infection, stiffness, dislocation/subluxation,  thromboembolic complications and other imponderables were discussed.  The patient acknowledged the explanation, agreed to proceed with the plan and consent was signed. Patient is being admitted for inpatient treatment for surgery, pain control, PT, OT, prophylactic antibiotics, VTE prophylaxis, progressive ambulation and ADL's and discharge planning.The patient is planning  to be discharged home with home health services    TX[facility] IV PCP: Dr. Raechel Chute Cardio: Dr. Wyvonne Lenz, PA-C

## 2015-01-05 ENCOUNTER — Inpatient Hospital Stay (HOSPITAL_COMMUNITY): Payer: Medicare Other | Admitting: Anesthesiology

## 2015-01-05 ENCOUNTER — Inpatient Hospital Stay (HOSPITAL_COMMUNITY): Payer: Medicare Other

## 2015-01-05 ENCOUNTER — Encounter (HOSPITAL_COMMUNITY): Payer: Self-pay | Admitting: *Deleted

## 2015-01-05 ENCOUNTER — Encounter (HOSPITAL_COMMUNITY)
Admission: RE | Disposition: A | Discharge status: Home Health | Payer: Self-pay | Source: Ambulatory Visit | Attending: Orthopedic Surgery

## 2015-01-05 ENCOUNTER — Inpatient Hospital Stay (HOSPITAL_COMMUNITY)
Admission: RE | Admit: 2015-01-05 | Discharge: 2015-01-07 | DRG: 470 | Disposition: A | Discharge status: Home Health | Payer: Medicare Other | Source: Ambulatory Visit | Attending: Orthopedic Surgery | Admitting: Orthopedic Surgery

## 2015-01-05 DIAGNOSIS — G4730 Sleep apnea, unspecified: Secondary | ICD-10-CM | POA: Diagnosis present

## 2015-01-05 DIAGNOSIS — Z96642 Presence of left artificial hip joint: Secondary | ICD-10-CM | POA: Diagnosis not present

## 2015-01-05 DIAGNOSIS — K449 Diaphragmatic hernia without obstruction or gangrene: Secondary | ICD-10-CM | POA: Diagnosis not present

## 2015-01-05 DIAGNOSIS — K219 Gastro-esophageal reflux disease without esophagitis: Secondary | ICD-10-CM | POA: Diagnosis not present

## 2015-01-05 DIAGNOSIS — Z471 Aftercare following joint replacement surgery: Secondary | ICD-10-CM | POA: Diagnosis not present

## 2015-01-05 DIAGNOSIS — Z6841 Body Mass Index (BMI) 40.0 and over, adult: Secondary | ICD-10-CM

## 2015-01-05 DIAGNOSIS — Z79899 Other long term (current) drug therapy: Secondary | ICD-10-CM | POA: Diagnosis not present

## 2015-01-05 DIAGNOSIS — Z96641 Presence of right artificial hip joint: Secondary | ICD-10-CM | POA: Diagnosis not present

## 2015-01-05 DIAGNOSIS — Z7982 Long term (current) use of aspirin: Secondary | ICD-10-CM | POA: Diagnosis not present

## 2015-01-05 DIAGNOSIS — M169 Osteoarthritis of hip, unspecified: Secondary | ICD-10-CM | POA: Diagnosis not present

## 2015-01-05 DIAGNOSIS — Z9049 Acquired absence of other specified parts of digestive tract: Secondary | ICD-10-CM | POA: Diagnosis not present

## 2015-01-05 DIAGNOSIS — M1612 Unilateral primary osteoarthritis, left hip: Principal | ICD-10-CM | POA: Diagnosis present

## 2015-01-05 DIAGNOSIS — E785 Hyperlipidemia, unspecified: Secondary | ICD-10-CM | POA: Diagnosis not present

## 2015-01-05 DIAGNOSIS — I10 Essential (primary) hypertension: Secondary | ICD-10-CM | POA: Diagnosis present

## 2015-01-05 DIAGNOSIS — Z87891 Personal history of nicotine dependence: Secondary | ICD-10-CM | POA: Diagnosis not present

## 2015-01-05 DIAGNOSIS — Z96649 Presence of unspecified artificial hip joint: Secondary | ICD-10-CM

## 2015-01-05 DIAGNOSIS — M25552 Pain in left hip: Secondary | ICD-10-CM | POA: Diagnosis present

## 2015-01-05 HISTORY — PX: TOTAL HIP ARTHROPLASTY: SHX124

## 2015-01-05 LAB — TYPE AND SCREEN
ABO/RH(D): A POS
Antibody Screen: NEGATIVE

## 2015-01-05 SURGERY — ARTHROPLASTY, HIP, TOTAL, ANTERIOR APPROACH
Anesthesia: General | Site: Hip | Laterality: Left

## 2015-01-05 MED ORDER — CIPROFLOXACIN IN D5W 400 MG/200ML IV SOLN
INTRAVENOUS | Status: AC
  Filled 2015-01-05: qty 200

## 2015-01-05 MED ORDER — ONDANSETRON HCL 4 MG PO TABS: 4.0000 mg | ORAL_TABLET | Freq: Four times a day (QID) | ORAL | Status: DC | PRN

## 2015-01-05 MED ORDER — MENTHOL 3 MG MT LOZG
1.0000 | LOZENGE | OROMUCOSAL | Status: DC | PRN
  Filled 2015-01-05: qty 9

## 2015-01-05 MED ORDER — ACETAMINOPHEN 10 MG/ML IV SOLN
1000.0000 mg | Freq: Once | INTRAVENOUS | Status: AC
  Administered 2015-01-05: 1000 mg via INTRAVENOUS
  Filled 2015-01-05: qty 100

## 2015-01-05 MED ORDER — METOPROLOL SUCCINATE ER 25 MG PO TB24
25.0000 mg | ORAL_TABLET | Freq: Every day | ORAL | Status: DC
  Filled 2015-01-05: qty 1

## 2015-01-05 MED ORDER — ACETAMINOPHEN 325 MG PO TABS
650.0000 mg | ORAL_TABLET | Freq: Four times a day (QID) | ORAL | Status: DC | PRN
  Administered 2015-01-07: 650 mg via ORAL
  Filled 2015-01-05: qty 2

## 2015-01-05 MED ORDER — CIPROFLOXACIN IN D5W 400 MG/200ML IV SOLN
400.0000 mg | Freq: Two times a day (BID) | INTRAVENOUS | Status: DC
  Administered 2015-01-05 (×2): 400 mg via INTRAVENOUS

## 2015-01-05 MED ORDER — CIPROFLOXACIN HCL 500 MG PO TABS
500.0000 mg | ORAL_TABLET | Freq: Two times a day (BID) | ORAL | Status: DC
  Administered 2015-01-05 – 2015-01-07 (×4): 500 mg via ORAL
  Filled 2015-01-05 (×6): qty 1

## 2015-01-05 MED ORDER — GLYCOPYRROLATE 0.2 MG/ML IJ SOLN
INTRAMUSCULAR | Status: DC | PRN
  Administered 2015-01-05 (×3): 0.2 mg via INTRAVENOUS

## 2015-01-05 MED ORDER — DILTIAZEM HCL ER COATED BEADS 240 MG PO CP24
240.0000 mg | ORAL_CAPSULE | Freq: Every day | ORAL | Status: DC
  Administered 2015-01-05: 240 mg via ORAL
  Filled 2015-01-05 (×3): qty 1

## 2015-01-05 MED ORDER — LIDOCAINE HCL (CARDIAC) 20 MG/ML IV SOLN
INTRAVENOUS | Status: DC | PRN
  Administered 2015-01-05: 75 mg via INTRAVENOUS

## 2015-01-05 MED ORDER — SODIUM CHLORIDE 0.9 % IV SOLN
INTRAVENOUS | Status: DC
  Administered 2015-01-06: 18:00:00 via INTRAVENOUS

## 2015-01-05 MED ORDER — CISATRACURIUM BESYLATE (PF) 10 MG/5ML IV SOLN
INTRAVENOUS | Status: DC | PRN
  Administered 2015-01-05: 4 mg via INTRAVENOUS
  Administered 2015-01-05: 8 mg via INTRAVENOUS

## 2015-01-05 MED ORDER — DIPHENHYDRAMINE HCL 12.5 MG/5ML PO ELIX: 12.5000 mg | ORAL_SOLUTION | ORAL | Status: DC | PRN

## 2015-01-05 MED ORDER — MIDAZOLAM HCL 2 MG/2ML IJ SOLN
INTRAMUSCULAR | Status: AC
  Filled 2015-01-05: qty 2

## 2015-01-05 MED ORDER — BISACODYL 10 MG RE SUPP: 10.0000 mg | Freq: Every day | RECTAL | Status: DC | PRN

## 2015-01-05 MED ORDER — METOPROLOL SUCCINATE ER 50 MG PO TB24
50.0000 mg | ORAL_TABLET | Freq: Every day | ORAL | Status: DC
Start: 2015-01-06 — End: 2015-01-07
  Administered 2015-01-06 – 2015-01-07 (×2): 50 mg via ORAL
  Filled 2015-01-05 (×3): qty 1

## 2015-01-05 MED ORDER — POTASSIUM CHLORIDE ER 10 MEQ PO TBCR
10.0000 meq | EXTENDED_RELEASE_TABLET | Freq: Every morning | ORAL | Status: DC
  Administered 2015-01-06 – 2015-01-07 (×2): 10 meq via ORAL
  Filled 2015-01-05 (×2): qty 1

## 2015-01-05 MED ORDER — METHOCARBAMOL 1000 MG/10ML IJ SOLN
500.0000 mg | Freq: Four times a day (QID) | INTRAVENOUS | Status: DC | PRN
  Administered 2015-01-05: 500 mg via INTRAVENOUS
  Filled 2015-01-05 (×2): qty 5

## 2015-01-05 MED ORDER — LACTATED RINGERS IV SOLN
INTRAVENOUS | Status: DC | PRN
  Administered 2015-01-05: 14:00:00 via INTRAVENOUS

## 2015-01-05 MED ORDER — 0.9 % SODIUM CHLORIDE (POUR BTL) OPTIME
TOPICAL | Status: DC | PRN
  Administered 2015-01-05: 1000 mL

## 2015-01-05 MED ORDER — EPHEDRINE SULFATE 50 MG/ML IJ SOLN
INTRAMUSCULAR | Status: DC | PRN
  Administered 2015-01-05: 5 mg via INTRAVENOUS
  Administered 2015-01-05: 10 mg via INTRAVENOUS

## 2015-01-05 MED ORDER — FLEET ENEMA 7-19 GM/118ML RE ENEM
1.0000 | ENEMA | Freq: Once | RECTAL | Status: AC | PRN
Start: 2015-01-05 — End: 2015-01-05

## 2015-01-05 MED ORDER — BUPIVACAINE LIPOSOME 1.3 % IJ SUSP
20.0000 mL | Freq: Once | INTRAMUSCULAR | Status: DC
  Filled 2015-01-05: qty 20

## 2015-01-05 MED ORDER — CEFAZOLIN SODIUM-DEXTROSE 2-3 GM-% IV SOLR: 2.0000 g | INTRAVENOUS | Status: DC

## 2015-01-05 MED ORDER — DOCUSATE SODIUM 100 MG PO CAPS
100.0000 mg | ORAL_CAPSULE | Freq: Two times a day (BID) | ORAL | Status: DC
  Administered 2015-01-07: 100 mg via ORAL

## 2015-01-05 MED ORDER — PROPOFOL 10 MG/ML IV BOLUS
INTRAVENOUS | Status: DC | PRN
  Administered 2015-01-05: 175 mg via INTRAVENOUS

## 2015-01-05 MED ORDER — FENTANYL CITRATE 0.05 MG/ML IJ SOLN
INTRAMUSCULAR | Status: AC
  Filled 2015-01-05: qty 2

## 2015-01-05 MED ORDER — LACTATED RINGERS IV SOLN: INTRAVENOUS | Status: DC

## 2015-01-05 MED ORDER — LOTEPREDNOL ETABONATE 0.2 % OP SUSP: 1.0000 [drp] | Freq: Every day | OPHTHALMIC | Status: DC | PRN

## 2015-01-05 MED ORDER — ONDANSETRON HCL 4 MG/2ML IJ SOLN: 4.0000 mg | Freq: Four times a day (QID) | INTRAMUSCULAR | Status: DC | PRN

## 2015-01-05 MED ORDER — FENTANYL CITRATE 0.05 MG/ML IJ SOLN
INTRAMUSCULAR | Status: DC | PRN
  Administered 2015-01-05 (×3): 50 ug via INTRAVENOUS

## 2015-01-05 MED ORDER — HYDROMORPHONE HCL 1 MG/ML IJ SOLN: 0.2500 mg | INTRAMUSCULAR | Status: DC | PRN

## 2015-01-05 MED ORDER — ONDANSETRON HCL 4 MG/2ML IJ SOLN
INTRAMUSCULAR | Status: AC
  Filled 2015-01-05: qty 2

## 2015-01-05 MED ORDER — PHENOL 1.4 % MT LIQD: 1.0000 | OROMUCOSAL | Status: DC | PRN

## 2015-01-05 MED ORDER — MORPHINE SULFATE 2 MG/ML IJ SOLN: 1.0000 mg | INTRAMUSCULAR | Status: DC | PRN

## 2015-01-05 MED ORDER — DEXAMETHASONE SODIUM PHOSPHATE 10 MG/ML IJ SOLN
INTRAMUSCULAR | Status: DC | PRN
  Administered 2015-01-05: 10 mg via INTRAVENOUS

## 2015-01-05 MED ORDER — SODIUM CHLORIDE 0.9 % IJ SOLN
INTRAMUSCULAR | Status: DC | PRN
  Administered 2015-01-05: 50 mL via INTRAVENOUS

## 2015-01-05 MED ORDER — FENTANYL CITRATE 0.05 MG/ML IJ SOLN
INTRAMUSCULAR | Status: AC
  Filled 2015-01-05: qty 5

## 2015-01-05 MED ORDER — DEXAMETHASONE SODIUM PHOSPHATE 10 MG/ML IJ SOLN
INTRAMUSCULAR | Status: AC
  Filled 2015-01-05: qty 1

## 2015-01-05 MED ORDER — TRANEXAMIC ACID 100 MG/ML IV SOLN
1000.0000 mg | INTRAVENOUS | Status: AC
  Administered 2015-01-05: 1000 mg via INTRAVENOUS
  Filled 2015-01-05: qty 10

## 2015-01-05 MED ORDER — CEFAZOLIN SODIUM-DEXTROSE 2-3 GM-% IV SOLR
INTRAVENOUS | Status: AC
  Filled 2015-01-05: qty 50

## 2015-01-05 MED ORDER — FAMOTIDINE 20 MG PO TABS
20.0000 mg | ORAL_TABLET | Freq: Two times a day (BID) | ORAL | Status: DC
  Administered 2015-01-05 – 2015-01-07 (×4): 20 mg via ORAL
  Filled 2015-01-05 (×5): qty 1

## 2015-01-05 MED ORDER — NEOSTIGMINE METHYLSULFATE 10 MG/10ML IV SOLN
INTRAVENOUS | Status: DC | PRN
  Administered 2015-01-05: 2 mg via INTRAVENOUS

## 2015-01-05 MED ORDER — METOCLOPRAMIDE HCL 5 MG/ML IJ SOLN
INTRAMUSCULAR | Status: DC | PRN
  Administered 2015-01-05: 5 mg via INTRAVENOUS

## 2015-01-05 MED ORDER — BUPIVACAINE HCL (PF) 0.25 % IJ SOLN
INTRAMUSCULAR | Status: DC | PRN
  Administered 2015-01-05: 30 mL

## 2015-01-05 MED ORDER — SODIUM CHLORIDE 0.9 % IV SOLN
INTRAVENOUS | Status: DC
  Administered 2015-01-05: 1000 mL via INTRAVENOUS
  Administered 2015-01-05: 16:00:00 via INTRAVENOUS

## 2015-01-05 MED ORDER — PHENYLEPHRINE HCL 10 MG/ML IJ SOLN
INTRAMUSCULAR | Status: DC | PRN
  Administered 2015-01-05: 40 ug via INTRAVENOUS

## 2015-01-05 MED ORDER — VANCOMYCIN HCL IN DEXTROSE 1-5 GM/200ML-% IV SOLN
1000.0000 mg | Freq: Two times a day (BID) | INTRAVENOUS | Status: AC
  Administered 2015-01-06: 1000 mg via INTRAVENOUS
  Filled 2015-01-05: qty 200

## 2015-01-05 MED ORDER — METOCLOPRAMIDE HCL 10 MG PO TABS: 5.0000 mg | ORAL_TABLET | Freq: Three times a day (TID) | ORAL | Status: DC | PRN

## 2015-01-05 MED ORDER — MIDAZOLAM HCL 5 MG/5ML IJ SOLN
INTRAMUSCULAR | Status: DC | PRN
  Administered 2015-01-05 (×2): 1 mg via INTRAVENOUS

## 2015-01-05 MED ORDER — HYDROMORPHONE HCL 2 MG PO TABS
2.0000 mg | ORAL_TABLET | ORAL | Status: DC | PRN
  Administered 2015-01-05 – 2015-01-06 (×5): 2 mg via ORAL
  Filled 2015-01-05 (×6): qty 1

## 2015-01-05 MED ORDER — METHOCARBAMOL 500 MG PO TABS
500.0000 mg | ORAL_TABLET | Freq: Four times a day (QID) | ORAL | Status: DC | PRN
  Administered 2015-01-07: 500 mg via ORAL
  Filled 2015-01-05: qty 1

## 2015-01-05 MED ORDER — ACETAMINOPHEN 650 MG RE SUPP: 650.0000 mg | Freq: Four times a day (QID) | RECTAL | Status: DC | PRN

## 2015-01-05 MED ORDER — BUPIVACAINE LIPOSOME 1.3 % IJ SUSP
INTRAMUSCULAR | Status: DC | PRN
  Administered 2015-01-05: 20 mL

## 2015-01-05 MED ORDER — LACTATED RINGERS IV SOLN
INTRAVENOUS | Status: DC
  Administered 2015-01-05: 1000 mL via INTRAVENOUS

## 2015-01-05 MED ORDER — SODIUM CHLORIDE 0.9 % IJ SOLN
INTRAMUSCULAR | Status: AC
  Filled 2015-01-05: qty 50

## 2015-01-05 MED ORDER — CHLORHEXIDINE GLUCONATE 4 % EX LIQD: 60.0000 mL | Freq: Once | CUTANEOUS | Status: DC

## 2015-01-05 MED ORDER — ONDANSETRON HCL 4 MG/2ML IJ SOLN
INTRAMUSCULAR | Status: DC | PRN
  Administered 2015-01-05 (×2): 2 mg via INTRAVENOUS

## 2015-01-05 MED ORDER — FUROSEMIDE 40 MG PO TABS
40.0000 mg | ORAL_TABLET | Freq: Every morning | ORAL | Status: DC
Start: 2015-01-06 — End: 2015-01-07
  Administered 2015-01-06 – 2015-01-07 (×2): 40 mg via ORAL
  Filled 2015-01-05 (×2): qty 1

## 2015-01-05 MED ORDER — ACETAMINOPHEN 500 MG PO TABS
1000.0000 mg | ORAL_TABLET | Freq: Four times a day (QID) | ORAL | Status: AC
Start: 2015-01-05 — End: 2015-01-06
  Administered 2015-01-05 – 2015-01-06 (×4): 1000 mg via ORAL
  Filled 2015-01-05 (×4): qty 2

## 2015-01-05 MED ORDER — METOCLOPRAMIDE HCL 5 MG/ML IJ SOLN: 5.0000 mg | Freq: Three times a day (TID) | INTRAMUSCULAR | Status: DC | PRN

## 2015-01-05 MED ORDER — RIVAROXABAN 10 MG PO TABS
10.0000 mg | ORAL_TABLET | Freq: Every day | ORAL | Status: DC
  Filled 2015-01-05 (×2): qty 1

## 2015-01-05 MED ORDER — LOTEPREDNOL ETABONATE 0.5 % OP SUSP: 1.0000 [drp] | Freq: Every day | OPHTHALMIC | Status: DC | PRN

## 2015-01-05 MED ORDER — POLYETHYLENE GLYCOL 3350 17 G PO PACK: 17.0000 g | PACK | Freq: Every day | ORAL | Status: DC | PRN

## 2015-01-05 MED ORDER — VANCOMYCIN HCL 10 G IV SOLR
1500.0000 mg | Freq: Once | INTRAVENOUS | Status: AC
  Administered 2015-01-05: 1500 mg via INTRAVENOUS
  Filled 2015-01-05: qty 1500

## 2015-01-05 MED ORDER — SUCCINYLCHOLINE CHLORIDE 20 MG/ML IJ SOLN
INTRAMUSCULAR | Status: DC | PRN
  Administered 2015-01-05: 120 mg via INTRAVENOUS

## 2015-01-05 MED ORDER — PROPOFOL 10 MG/ML IV BOLUS
INTRAVENOUS | Status: AC
  Filled 2015-01-05: qty 20

## 2015-01-05 MED ORDER — BUPIVACAINE HCL (PF) 0.25 % IJ SOLN
INTRAMUSCULAR | Status: AC
  Filled 2015-01-05: qty 30

## 2015-01-05 SURGICAL SUPPLY — 39 items
BAG ZIPLOCK 12X15 (MISCELLANEOUS) IMPLANT
BLADE EXTENDED COATED 6.5IN (ELECTRODE) ×3 IMPLANT
BLADE SAG 18X100X1.27 (BLADE) ×3 IMPLANT
CAPT HIP TOTAL 2 ×3 IMPLANT
CLOSURE WOUND 1/2 X4 (GAUZE/BANDAGES/DRESSINGS) ×1
COVER PERINEAL POST (MISCELLANEOUS) ×3 IMPLANT
DECANTER SPIKE VIAL GLASS SM (MISCELLANEOUS) ×3 IMPLANT
DRAPE C-ARM 42X120 X-RAY (DRAPES) ×3 IMPLANT
DRAPE STERI IOBAN 125X83 (DRAPES) ×3 IMPLANT
DRAPE U-SHAPE 47X51 STRL (DRAPES) ×9 IMPLANT
DRSG ADAPTIC 3X8 NADH LF (GAUZE/BANDAGES/DRESSINGS) ×3 IMPLANT
DRSG MEPILEX BORDER 4X4 (GAUZE/BANDAGES/DRESSINGS) ×3 IMPLANT
DRSG MEPILEX BORDER 4X8 (GAUZE/BANDAGES/DRESSINGS) ×3 IMPLANT
DURAPREP 26ML APPLICATOR (WOUND CARE) ×3 IMPLANT
ELECT REM PT RETURN 9FT ADLT (ELECTROSURGICAL) ×3
ELECTRODE REM PT RTRN 9FT ADLT (ELECTROSURGICAL) ×1 IMPLANT
EVACUATOR 1/8 PVC DRAIN (DRAIN) ×3 IMPLANT
FACESHIELD WRAPAROUND (MASK) ×12 IMPLANT
GLOVE BIO SURGEON STRL SZ7.5 (GLOVE) ×3 IMPLANT
GLOVE BIO SURGEON STRL SZ8 (GLOVE) ×6 IMPLANT
GLOVE BIOGEL PI IND STRL 8 (GLOVE) ×2 IMPLANT
GLOVE BIOGEL PI INDICATOR 8 (GLOVE) ×4
GOWN STRL REUS W/TWL LRG LVL3 (GOWN DISPOSABLE) ×3 IMPLANT
GOWN STRL REUS W/TWL XL LVL3 (GOWN DISPOSABLE) ×3 IMPLANT
KIT BASIN OR (CUSTOM PROCEDURE TRAY) ×3 IMPLANT
NDL SAFETY ECLIPSE 18X1.5 (NEEDLE) ×2 IMPLANT
NEEDLE HYPO 18GX1.5 SHARP (NEEDLE) ×4
PACK TOTAL JOINT (CUSTOM PROCEDURE TRAY) ×3 IMPLANT
STRIP CLOSURE SKIN 1/2X4 (GAUZE/BANDAGES/DRESSINGS) ×2 IMPLANT
SUT ETHIBOND NAB CT1 #1 30IN (SUTURE) ×3 IMPLANT
SUT MNCRL AB 4-0 PS2 18 (SUTURE) ×3 IMPLANT
SUT VIC AB 2-0 CT1 27 (SUTURE) ×4
SUT VIC AB 2-0 CT1 TAPERPNT 27 (SUTURE) ×2 IMPLANT
SUT VLOC 180 0 24IN GS25 (SUTURE) ×3 IMPLANT
SYR 20CC LL (SYRINGE) ×3 IMPLANT
SYR 50ML LL SCALE MARK (SYRINGE) ×3 IMPLANT
TOWEL OR 17X26 10 PK STRL BLUE (TOWEL DISPOSABLE) ×3 IMPLANT
TOWEL OR NON WOVEN STRL DISP B (DISPOSABLE) IMPLANT
TRAY FOLEY CATH 14FRSI W/METER (CATHETERS) ×3 IMPLANT

## 2015-01-05 NOTE — Anesthesia Postprocedure Evaluation (Signed)
  Anesthesia Post-op Note  Patient: Victoria Delgado  Procedure(s) Performed: Procedure(s) (LRB): LEFT TOTAL HIP ARTHROPLASTY ANTERIOR APPROACH (Left)  Patient Location: PACU  Anesthesia Type: general  Level of Consciousness: awake and alert   Airway and Oxygen Therapy: Patient Spontanous Breathing  Post-op Pain: mild  Post-op Assessment: Post-op Vital signs reviewed, Patient's Cardiovascular Status Stable, Respiratory Function Stable, Patent Airway and No signs of Nausea or vomiting  Last Vitals:  Filed Vitals:   01/05/15 1845  BP: 117/49  Pulse: 55  Temp: 36.6 C  Resp: 16    Post-op Vital Signs: stable   Complications: No apparent anesthesia complications

## 2015-01-05 NOTE — Interval H&P Note (Signed)
History and Physical Interval Note:  01/05/2015 12:50 PM  Victoria Delgado  has presented today for surgery, with the diagnosis of OA OF LEFT HIP  The various methods of treatment have been discussed with the patient and family. After consideration of risks, benefits and other options for treatment, the patient has consented to  Procedure(s): LEFT TOTAL HIP ARTHROPLASTY ANTERIOR APPROACH (Left) as a surgical intervention .  The patient's history has been reviewed, patient examined, no change in status, stable for surgery.  I have reviewed the patient's chart and labs.  Questions were answered to the patient's satisfaction.     Loanne Drilling

## 2015-01-05 NOTE — Anesthesia Procedure Notes (Signed)
Procedure Name: Intubation Date/Time: 01/05/2015 1:40 PM Performed by: Edison Pace Pre-anesthesia Checklist: Patient identified, Timeout performed, Emergency Drugs available, Suction available and Patient being monitored Patient Re-evaluated:Patient Re-evaluated prior to inductionOxygen Delivery Method: Circle system utilized Preoxygenation: Pre-oxygenation with 100% oxygen Intubation Type: IV induction and Cricoid Pressure applied Ventilation: Mask ventilation without difficulty Laryngoscope Size: Mac and 4 Grade View: Grade II Tube type: Oral Tube size: 7.5 mm Number of attempts: 1 Airway Equipment and Method: Stylet Placement Confirmation: ETT inserted through vocal cords under direct vision,  breath sounds checked- equal and bilateral and positive ETCO2 Secured at: 21 cm Tube secured with: Tape Dental Injury: Teeth and Oropharynx as per pre-operative assessment

## 2015-01-05 NOTE — Progress Notes (Signed)
Pt. States she has a current UTI, has only taken two doses of antibiotic so far.  Dr. Lequita Halt notifies, will give Cipro here.

## 2015-01-05 NOTE — Progress Notes (Signed)
Pt stated that she would self administer CPAP when ready for bed.  Current settings is 8 CMH20 with 2 LPM O2 bleed in via Pt home nasal pillows and tubing.  Pt to call if any problems should arise, RT to monitor and assess as needed.

## 2015-01-05 NOTE — Transfer of Care (Signed)
Immediate Anesthesia Transfer of Care Note  Patient: Victoria Delgado  Procedure(s) Performed: Procedure(s): LEFT TOTAL HIP ARTHROPLASTY ANTERIOR APPROACH (Left)  Patient Location: PACU  Anesthesia Type:General  Level of Consciousness: awake, oriented, patient cooperative, lethargic and responds to stimulation  Airway & Oxygen Therapy: Patient Spontanous Breathing and Patient connected to face mask oxygen  Post-op Assessment: Report given to RN, Post -op Vital signs reviewed and stable and Patient moving all extremities  Post vital signs: Reviewed and stable  Last Vitals:  Filed Vitals:   01/05/15 1055  BP: 131/57  Pulse: 62  Temp: 36.6 C  Resp: 18    Complications: No apparent anesthesia complications

## 2015-01-05 NOTE — Op Note (Signed)
OPERATIVE REPORT  PREOPERATIVE DIAGNOSIS: Osteoarthritis of the Left hip.   POSTOPERATIVE DIAGNOSIS: Osteoarthritis of the Left  hip.   PROCEDURE: Left total hip arthroplasty, anterior approach.   SURGEON: Ollen Gross, MD   ASSISTANT: Avel Peace, PA-C  ANESTHESIA:  General  ESTIMATED BLOOD LOSS:- 350 ml   DRAINS: Hemovac x1.   COMPLICATIONS: None   CONDITION: PACU - hemodynamically stable.   BRIEF CLINICAL NOTE: Victoria Delgado is a 74 y.o. female who has advanced end-  stage arthritis of her Left  hip with progressively worsening pain and  dysfunction.The patient has failed nonoperative management and presents for  total hip arthroplasty.   PROCEDURE IN DETAIL: After successful administration of spinal  anesthetic, the traction boots for the [facility] bed were placed on both  feet and the patient was placed onto the [facility] bed, boots placed into the leg  holders. The Left hip was then isolated from the perineum with plastic  drapes and prepped and draped in the usual sterile fashion. ASIS and  greater trochanter were marked and a oblique incision was made, starting  at about 1 cm lateral and 2 cm distal to the ASIS and coursing towards  the anterior cortex of the femur. The skin was cut with a 10 blade  through subcutaneous tissue to the level of the fascia overlying the  tensor fascia lata muscle. The fascia was then incised in line with the  incision at the junction of the anterior third and posterior 2/3rd. The  muscle was teased off the fascia and then the interval between the TFL  and the rectus was developed. The Hohmann retractor was then placed at  the top of the femoral neck over the capsule. The vessels overlying the  capsule were cauterized and the fat on top of the capsule was removed.  A Hohmann retractor was then placed anterior underneath the rectus  femoris to give exposure to the entire anterior capsule. A T-shaped  capsulotomy was performed. The  edges were tagged and the femoral head  was identified.       Osteophytes are removed off the superior acetabulum.  The femoral neck was then cut in situ with an oscillating saw. Traction  was then applied to the left lower extremity utilizing the [facility]  traction. The femoral head was then removed. Retractors were placed  around the acetabulum and then circumferential removal of the labrum was  performed. Osteophytes were also removed. Reaming starts at 47 mm to  medialize and  Increased in 2 mm increments to 51 mm. We reamed in  approximately 40 degrees of abduction, 20 degrees anteversion. A 52 mm  pinnacle acetabular shell was then impacted in anatomic position under  fluoroscopic guidance with excellent purchase. We did not need to place  any additional dome screws. A 32 mm neutral + 4 marathon liner was then  placed into the acetabular shell.       The femoral lift was then placed along the lateral aspect of the femur  just distal to the vastus ridge. The leg was  externally rotated and capsule  was stripped off the inferior aspect of the femoral neck down to the  level of the lesser trochanter, this was done with electrocautery. The femur was lifted after this was performed. The  leg was then placed and extended in adducted position to essentially delivering the femur. We also removed the capsule superiorly and the  piriformis from the  piriformis fossa to gain excellent exposure of the  proximal femur. Rongeur was used to remove some cancellous bone to get  into the lateral portion of the proximal femur for placement of the  initial starter reamer. The starter broaches was placed  the starter broach  and was shown to go down the center of the canal. Broaching  with the  Corail system was then performed starting at size 8, coursing  Up to size 10. A size 10 had excellent torsional and rotational  and axial stability. The trial standard offset neck was then placed  with a 32 + 1 trial  head. The hip was then reduced. We confirmed that  the stem was in the canal both on AP and lateral x-rays. It also has excellent sizing. The hip was reduced with outstanding stability through full extension, full external rotation,  and then flexion in adduction internal rotation. AP pelvis was taken  and the leg lengths were measured and found to be exactly equal. Hip  was then dislocated again and the femoral head and neck removed. The  femoral broach was removed. Size 10 Corail stem with a standard offset  neck was then impacted into the femur following native anteversion. Has  excellent purchase in the canal. Excellent torsional and rotational and  axial stability. It is confirmed to be in the canal on AP and lateral  fluoroscopic views. The 32 + 1 ceramic head was placed and the hip  reduced with outstanding stability. Again AP pelvis was taken and it  confirmed that the leg lengths were equal. The wound was then copiously  irrigated with saline solution and the capsule reattached and repaired  with Ethibond suture.  20 mL of Exparel mixed with 50 mL of saline then additional 20 ml of .25% Bupivicaine injected into the capsule and into the edge of the tensor fascia lata as well as subcutaneous tissue. The fascia overlying the tensor fascia lata was  then closed with a running #1 V-Loc. Subcu was closed with interrupted  2-0 Vicryl and subcuticular running 4-0 Monocryl. Incision was cleaned  and dried. Steri-Strips and a bulky sterile dressing applied. Hemovac  drain was hooked to suction and then he was awakened and transported to  recovery in stable condition.        Please note that a surgical assistant was a medical necessity for this procedure to perform it in a safe and expeditious manner. Assistant was necessary to provide appropriate retraction of vital neurovascular structures and to prevent femoral fracture and allow for anatomic placement of the prosthesis.  Gaynelle Arabian, M.D.

## 2015-01-05 NOTE — Anesthesia Preprocedure Evaluation (Addendum)
Anesthesia Evaluation  Patient identified by MRN, date of birth, ID band Patient awake    Reviewed: Allergy & Precautions, NPO status , Patient's Chart, lab work & pertinent test results  Airway Mallampati: II  TM Distance: >3 FB Neck ROM: Full    Dental no notable dental hx.    Pulmonary sleep apnea , former smoker,  breath sounds clear to auscultation  Pulmonary exam normal       Cardiovascular Exercise Tolerance: Good hypertension, Pt. on medications and Pt. on home beta blockers Rhythm:Regular Rate:Normal  Cardiology office note 04-30-14 Dr. Antoine Poche reviewed.  ECHO:05-12-14 reviewed: moderate aortic stenosis Normal LV function.   Neuro/Psych  Neuromuscular disease negative psych ROS   GI/Hepatic hiatal hernia, GERD-  Medicated,(+) Hepatitis -  Endo/Other  Morbid obesity  Renal/GU negative Renal ROS  negative genitourinary   Musculoskeletal  (+) Arthritis -,   Abdominal   Peds negative pediatric ROS (+)  Hematology negative hematology ROS (+)   Anesthesia Other Findings H/O bilateral detached retinas. Left eye worse and chronically irritated and red. It is red today. She states she had pain in right eye after last THA.  H/O dry eyes  Plan lubrication today while asleep.  Reproductive/Obstetrics negative OB ROS                           Anesthesia Physical Anesthesia Plan  ASA: III  Anesthesia Plan: General   Post-op Pain Management:    Induction: Intravenous  Airway Management Planned: Oral ETT  Additional Equipment:   Intra-op Plan:   Post-operative Plan: Extubation in OR  Informed Consent: I have reviewed the patients History and Physical, chart, labs and discussed the procedure including the risks, benefits and alternatives for the proposed anesthesia with the patient or authorized representative who has indicated his/her understanding and acceptance.   Dental advisory  given  Plan Discussed with: CRNA  Anesthesia Plan Comments: (Discussed spinal and general. She had general for other THA last September. She prefers general today. )       Anesthesia Quick Evaluation

## 2015-01-06 ENCOUNTER — Encounter (HOSPITAL_COMMUNITY): Payer: Self-pay | Admitting: Orthopedic Surgery

## 2015-01-06 LAB — BASIC METABOLIC PANEL
Anion gap: 8 (ref 5–15)
BUN: 11 mg/dL (ref 6–23)
CALCIUM: 9 mg/dL (ref 8.4–10.5)
CO2: 24 mmol/L (ref 19–32)
Chloride: 109 mmol/L (ref 96–112)
Creatinine, Ser: 0.73 mg/dL (ref 0.50–1.10)
GFR calc Af Amer: >90 mL/min (ref >90)
GFR calc non Af Amer: 84 mL/min — ABNORMAL LOW (ref >90)
GLUCOSE: 191 mg/dL — AB (ref 70–99)
POTASSIUM: 4.7 mmol/L (ref 3.5–5.1)
SODIUM: 141 mmol/L (ref 135–145)

## 2015-01-06 LAB — CBC
HEMATOCRIT: 39.4 % (ref 36.0–46.0)
Hemoglobin: 12.9 g/dL (ref 12.0–15.0)
MCH: 28.9 pg (ref 26.0–34.0)
MCHC: 32.7 g/dL (ref 30.0–36.0)
MCV: 88.1 fL (ref 78.0–100.0)
PLATELETS: 223 10*3/uL (ref 150–400)
RBC: 4.47 MIL/uL (ref 3.87–5.11)
RDW: 13.6 % (ref 11.5–15.5)
WBC: 11.6 10*3/uL — AB (ref 4.0–10.5)

## 2015-01-06 MED ORDER — METOPROLOL SUCCINATE ER 25 MG PO TB24
25.0000 mg | ORAL_TABLET | Freq: Every day | ORAL | Status: DC
  Filled 2015-01-06 (×2): qty 1

## 2015-01-06 MED ORDER — ENOXAPARIN SODIUM 40 MG/0.4ML ~~LOC~~ SOLN
40.0000 mg | Freq: Every day | SUBCUTANEOUS | Status: DC
Start: 2015-01-06 — End: 2015-01-07
  Administered 2015-01-06 – 2015-01-07 (×2): 40 mg via SUBCUTANEOUS
  Filled 2015-01-06 (×2): qty 0.4

## 2015-01-06 NOTE — Progress Notes (Signed)
Utilization review completed.  

## 2015-01-06 NOTE — Progress Notes (Signed)
Physical Therapy Treatment Patient Details Name: Victoria Delgado MRN: [id number] DOB: 1941/02/03 Today's Date: January 19, 2015    History of Present Illness L THR; s/p R THR 9/15    PT Comments      Follow Up Recommendations  Home health PT     Equipment Recommendations  None recommended by PT    Recommendations for Other Services OT consult     Precautions / Restrictions Precautions Precautions: Fall Restrictions Weight Bearing Restrictions: No Other Position/Activity Restrictions: WBAT    Mobility  Bed Mobility Overal bed mobility: Needs Assistance Bed Mobility: Sit to Supine       Sit to supine: Min assist   General bed mobility comments: cues for sequence and use of R LE to self assist  Transfers Overall transfer level: Needs assistance Equipment used: Rolling walker (2 wheeled) Transfers: Sit to/from Stand Sit to Stand: Min assist;From elevated surface         General transfer comment: cues for LE management and use of UEs to self assistq  Ambulation/Gait Ambulation/Gait assistance: Min assist Ambulation Distance (Feet): 150 Feet Assistive device: Rolling walker (2 wheeled) Gait Pattern/deviations: Step-to pattern;Step-through pattern;Decreased step length - right;Decreased step length - left;Shuffle;Antalgic     General Gait Details: cues for posture, position from RW and initial sequence   Stairs            Wheelchair Mobility    Modified Rankin (Stroke Patients Only)       Balance                                    Cognition Arousal/Alertness: Awake/alert Behavior During Therapy: WFL for tasks assessed/performed Overall Cognitive Status: Within Functional Limits for tasks assessed                      Exercises      General Comments        Pertinent Vitals/Pain Pain Assessment: 0-10 Pain Score: 4  Pain Location: L hip Pain Descriptors / Indicators: Aching;Sore Pain Intervention(s): Limited activity  within patient's tolerance;Monitored during session;Ice applied;Patient requesting pain meds-RN notified    Home Living                      Prior Function            PT Goals (current goals can now be found in the care plan section) Acute Rehab PT Goals Patient Stated Goal: Resume previous lifestyle with decreased pain PT Goal Formulation: With patient Time For Goal Achievement: 01/13/15 Potential to Achieve Goals: Good Progress towards PT goals: Progressing toward goals    Frequency  7X/week    PT Plan Current plan remains appropriate    Co-evaluation             End of Session Equipment Utilized During Treatment: Gait belt Activity Tolerance: Patient tolerated treatment well Patient left: in bed;with call bell/phone within reach;with family/visitor present     Time: 1354-1405 PT Time Calculation (min) (ACUTE ONLY): 11 min  Charges:  $Gait Training: 8-22 mins                    G Codes:      Emberley Kral Jan 19, 2015, 4:29 PM

## 2015-01-06 NOTE — Progress Notes (Addendum)
   Subjective: 1 Day Post-Op Procedure(s) (LRB): LEFT TOTAL HIP ARTHROPLASTY ANTERIOR APPROACH (Left) Patient reports pain as mild.   Patient seen in rounds for Dr. Lequita Halt. Patient is well, but has had some minor complaints of pain in the hip, requiring pain medications We will start therapy today.  Plan is to go Home after hospital stay.  Objective: Vital signs in last 24 hours: Temp:  [97.2 F (36.2 C)-98.4 F (36.9 C)] 97.3 F (36.3 C) (02/04 0926) Pulse Rate:  [54-99] 63 (02/04 0926) Resp:  [12-18] 16 (02/04 0926) BP: (103-154)/(43-57) 141/49 mmHg (02/04 0926) SpO2:  [95 %-100 %] 96 % (02/04 0926) Weight:  [132.45 kg (292 lb)] 132.45 kg (292 lb) (02/03 1646)  Intake/Output from previous day:  Intake/Output Summary (Last 24 hours) at 01/06/15 1004 Last data filed at 01/06/15 0817  Gross per 24 hour  Intake 2336.25 ml  Output   2435 ml  Net -98.75 ml    Intake/Output this shift: Total I/O In: 360 [P.O.:360] Out: 400 [Urine:400]  Labs:  Recent Labs  01/06/15 0527  HGB 12.9    Recent Labs  01/06/15 0527  WBC 11.6*  RBC 4.47  HCT 39.4  PLT 223    Recent Labs  01/06/15 0527  NA 141  K 4.7  CL 109  CO2 24  BUN 11  CREATININE 0.73  GLUCOSE 191*  CALCIUM 9.0   No results for input(s): LABPT, INR in the last 72 hours.  EXAM General - Patient is Alert, Appropriate and Oriented Extremity - Neurovascular intact Sensation intact distally Dorsiflexion/Plantar flexion intact Dressing - dressing C/D/I Motor Function - intact, moving foot and toes well on exam.  Hemovac pulled without difficulty.  Past Medical History  Diagnosis Date  . Cataract     corrective surgery done  . Hypertension   . GERD (gastroesophageal reflux disease)   . Hyperlipidemia   . Sleep apnea     CPAP  . Hernia, hiatal   . Heart palpitations   . Arthritis     osteoarthritis-hips,knees, back, hands  . Hepatitis     past hx.,many yrs ago ? food source     Assessment/Plan: 1 Day Post-Op Procedure(s) (LRB): LEFT TOTAL HIP ARTHROPLASTY ANTERIOR APPROACH (Left) Active Problems:   OA (osteoarthritis) of hip  Estimated body mass index is 44.41 kg/(m^2) as calculated from the following:   Height as of this encounter: 5\' 8"  (1.727 m).   Weight as of this encounter: 132.45 kg (292 lb). Advance diet Up with therapy Plan for discharge tomorrow Discharge home with home health  DVT Prophylaxis - Xarelto Weight Bearing As Tolerated left Leg Hemovac Pulled Begin Therapy  Avel Peace, PA-C Orthopaedic Surgery 01/06/2015, 10:04 AM  Addendum - Discussed the current DVT prophylaxis medication.  She questioned the use of it due to the fact we changed her over to the injectable Lovenox with her last surgery back in September of last year.  She tolerated the injection last time and is very comfortable with using the injection again this year to avoid any drug interaction.  We discuss that it was a low risk use but she seemed more comfortable with the treatment plan from before.  Will DC the Xarelto and start the Lovenox today and then transition over to Aspirin on an outpatient basis later.

## 2015-01-06 NOTE — Progress Notes (Signed)
RT placed patient on CPAP). Home setting is 8 cmH2O. Sterile water added to water chamber for humidification. RT will continue to assess and monitor as needed.

## 2015-01-06 NOTE — Evaluation (Signed)
Physical Therapy Evaluation Patient Details Name: Victoria Delgado MRN: [id number] DOB: January 20, 1941 Today's Date: 01/06/2015   History of Present Illness  L THR; s/p R THR 9/15  Clinical Impression  Pt s/p L THR presents with decreased L LE strength/ROM and post op pain limiting functional mobility.  Pt should progress well to d.c home with family assist and HHPT follow up.    Follow Up Recommendations Home health PT    Equipment Recommendations  None recommended by PT    Recommendations for Other Services OT consult     Precautions / Restrictions Precautions Precautions: Fall Restrictions Weight Bearing Restrictions: No Other Position/Activity Restrictions: WBAT      Mobility  Bed Mobility Overal bed mobility: Needs Assistance Bed Mobility: Supine to Sit     Supine to sit: Min assist;Mod assist     General bed mobility comments: cues for sequence and use of R LE to self assist  Transfers Overall transfer level: Needs assistance Equipment used: Rolling walker (2 wheeled) Transfers: Sit to/from Stand Sit to Stand: Min assist;From elevated surface         General transfer comment: cues for LE management and use of UEs to self assistq  Ambulation/Gait Ambulation/Gait assistance: Min assist Ambulation Distance (Feet): 150 Feet Assistive device: Rolling walker (2 wheeled) Gait Pattern/deviations: Step-to pattern;Step-through pattern;Decreased step length - right;Decreased step length - left;Shuffle     General Gait Details: cues for posture, position from RW and initial sequence  Stairs            Wheelchair Mobility    Modified Rankin (Stroke Patients Only)       Balance                                             Pertinent Vitals/Pain Pain Assessment: 0-10 Pain Score: 3  Pain Location: L hip Pain Descriptors / Indicators: Aching;Sore Pain Intervention(s): Limited activity within patient's tolerance;Monitored during  session;Premedicated before session;Ice applied    Home Living Family/patient expects to be discharged to:: Private residence Living Arrangements: Spouse/significant other Available Help at Discharge: Family Type of Home: House Home Access: Stairs to enter Entrance Stairs-Rails: Right Entrance Stairs-Number of Steps: 3-4 Home Layout: One level Home Equipment: Environmental consultant - 2 wheels;Cane - single point      Prior Function Level of Independence: Independent               Hand Dominance        Extremity/Trunk Assessment   Upper Extremity Assessment: Overall WFL for tasks assessed           Lower Extremity Assessment: LLE deficits/detail   LLE Deficits / Details: hip strength 2+/5 with AAROM at hip to 90 flex and 15 abd  Cervical / Trunk Assessment: Normal  Communication   Communication: No difficulties  Cognition Arousal/Alertness: Awake/alert Behavior During Therapy: WFL for tasks assessed/performed Overall Cognitive Status: Within Functional Limits for tasks assessed                      General Comments      Exercises Total Joint Exercises Ankle Circles/Pumps: AROM;Both;15 reps;Supine Quad Sets: AROM;Both;10 reps;Supine Heel Slides: AAROM;Left;15 reps;Supine Hip ABduction/ADduction: AAROM;Left;10 reps;Supine      Assessment/Plan    PT Assessment Patient needs continued PT services  PT Diagnosis Difficulty walking   PT Problem List Decreased strength;Decreased range of  motion;Decreased activity tolerance;Decreased mobility;Decreased knowledge of use of DME;Pain  PT Treatment Interventions DME instruction;Gait training;Stair training;Functional mobility training;Therapeutic activities;Therapeutic exercise;Patient/family education   PT Goals (Current goals can be found in the Care Plan section) Acute Rehab PT Goals Patient Stated Goal: Resume previous lifestyle with decreased pain PT Goal Formulation: With patient Time For Goal Achievement:  01/13/15 Potential to Achieve Goals: Good    Frequency 7X/week   Barriers to discharge        Co-evaluation               End of Session Equipment Utilized During Treatment: Gait belt Activity Tolerance: Patient tolerated treatment well Patient left: in chair;with call bell/phone within reach Nurse Communication: Mobility status         Time: 0850-0928 PT Time Calculation (min) (ACUTE ONLY): 38 min   Charges:   PT Evaluation $Initial PT Evaluation Tier I: 1 Procedure PT Treatments $Gait Training: 8-22 mins $Therapeutic Exercise: 8-22 mins   PT G Codes:        Victoria Delgado 2015/02/03, 11:13 AM

## 2015-01-06 NOTE — Progress Notes (Signed)
OT Cancellation Note  Patient Details Name: Victoria Delgado MRN: [id number] DOB: 1941-06-10   Cancelled Treatment:    Reason Eval/Treat Not Completed: OT screened, no needs identified, will sign off. Pt states she is familiar from previous hip surgery in Sept last year with ADL tasks. Husband present and confirms he can assist. Pt has her 3in1. Will sign off per pt request.  Lennox Laity  [phone] 01/06/2015, 12:04 PM

## 2015-01-06 NOTE — Care Management Note (Signed)
    Page 1 of 1   01/06/2015     11:51:47 AM CARE MANAGEMENT NOTE 01/06/2015  Patient:  Victoria Delgado, Victoria Delgado   Account Number:  [id number]  Date Initiated:  01/06/2015  Documentation initiated by:  [facility]  Subjective/Objective Assessment:   adm: LEFT TOTAL HIP ARTHROPLASTY ANTERIOR APPROACH (Left)     Action/Plan:   discharge planning   Anticipated DC Date:  01/07/2015   Anticipated DC Plan:  [place]  CM consult      [facility]  HOME HEALTH   Choice offered to / List presented to:  C-1 Patient        [place] arranged  [place]      [place]   Status of service:   Medicare Important Message given?   (If response is "NO", the following Medicare IM given date fields will be blank) Date Medicare IM given:   Medicare IM given by:   Date Additional Medicare IM given:   Additional Medicare IM given by:    Discharge Disposition:  [place]  Per UR Regulation:    If discussed at Long Length of Stay Meetings, dates discussed:    Comments:  01/06/15 11:00 CM met with pt in room to offer choice of home health agency.  Pt chooses [facility] to render HHPT.  address and contact information verified by pt.  No DME needed. Referral given to [employer], Tim.  No other CM needs were communicated.  Mariane Masters, BSN, [place]

## 2015-01-07 LAB — BASIC METABOLIC PANEL
Anion gap: 7 (ref 5–15)
BUN: 13 mg/dL (ref 6–23)
CALCIUM: 8.5 mg/dL (ref 8.4–10.5)
CO2: 26 mmol/L (ref 19–32)
Chloride: 109 mmol/L (ref 96–112)
Creatinine, Ser: 0.78 mg/dL (ref 0.50–1.10)
GFR calc Af Amer: >90 mL/min (ref >90)
GFR, EST NON AFRICAN AMERICAN: 82 mL/min — AB (ref >90)
Glucose, Bld: 168 mg/dL — ABNORMAL HIGH (ref 70–99)
POTASSIUM: 4.2 mmol/L (ref 3.5–5.1)
Sodium: 142 mmol/L (ref 135–145)

## 2015-01-07 LAB — CBC
HCT: 36.5 % (ref 36.0–46.0)
HEMOGLOBIN: 11.6 g/dL — AB (ref 12.0–15.0)
MCH: 28.8 pg (ref 26.0–34.0)
MCHC: 31.8 g/dL (ref 30.0–36.0)
MCV: 90.6 fL (ref 78.0–100.0)
PLATELETS: 221 10*3/uL (ref 150–400)
RBC: 4.03 MIL/uL (ref 3.87–5.11)
RDW: 14.4 % (ref 11.5–15.5)
WBC: 11.8 10*3/uL — ABNORMAL HIGH (ref 4.0–10.5)

## 2015-01-07 MED ORDER — ENOXAPARIN SODIUM 40 MG/0.4ML ~~LOC~~ SOLN: 40.0000 mg | Freq: Every day | SUBCUTANEOUS | Status: DC

## 2015-01-07 MED ORDER — HYDROMORPHONE HCL 2 MG PO TABS: 2.0000 mg | ORAL_TABLET | ORAL | Status: DC | PRN

## 2015-01-07 MED ORDER — CIPROFLOXACIN HCL 500 MG PO TABS: 500.0000 mg | ORAL_TABLET | Freq: Two times a day (BID) | ORAL | Status: DC

## 2015-01-07 NOTE — Progress Notes (Signed)
RN reviewed discharge instructions with patient and family. All questions answered.  Paperwork and prescriptions given.   NT rolled patient down in wheelchair to family car.  

## 2015-01-07 NOTE — Discharge Instructions (Signed)
Dr. Gaynelle Arabian [facility] [address] 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 Follow all safety measures which are given to protect your hip. If any of these exercises cause increased pain or swelling in your joint, decrease the amount until you are comfortable again. Then slowly increase the exercises. Call your caregiver if you have problems or questions.  HOME CARE INSTRUCTIONS  Most of the following instructions are designed to prevent the dislocation of your new hip.  Remove items at home which could result in a fall. This includes throw rugs or furniture in walking pathways.  Continue medications as instructed at time of discharge.  You may have some home medications which will be placed on hold until you complete the course of blood thinner medication.  You may start showering once you are discharged home but do not submerge the incision under water. Just pat the incision dry and apply a dry gauze dressing on daily. Do not put on socks or shoes without following the instructions of your caregivers.  Sit on high chairs which makes it easier to stand.  Sit on chairs with arms. Use the chair arms to help push yourself up when arising.  Keep your leg on the side of the operation out in front of you when standing up.  Arrange for the use of a toilet seat elevator so you are not sitting low.    Walk with walker as instructed.  You may resume a sexual relationship in one month or when given the OK by your caregiver.  Use walker as long as suggested by your caregivers.  You may put full weight on your legs and walk as much as is comfortable. Avoid periods of inactivity such as sitting longer than an hour  when not asleep. This helps prevent blood clots.  You may return to work once you are cleared by [employer], [employer].  Do not drive a car for 6 weeks or until released by your surgeon.  Do not drive while taking narcotics.  Wear elastic stockings for three weeks following surgery during the day but you may remove then at night.  Make sure you keep all of your appointments after your operation with all of your doctors and caregivers. You should call the office at the above phone number and make an appointment for approximately two weeks after the date of your surgery. Change the dressing daily and reapply a dry dressing each time. Please pick up a stool softener and laxative for home use as long as you are requiring pain medications.  ICE to the affected hip every three hours for 30 minutes at a time and then as needed for pain and swelling.  Continue to use ice on the hip for pain and swelling from surgery. You may notice swelling that will progress down to the foot and ankle.  This is normal after surgery.  Elevate the leg when you are not up walking on it.   It is important for you to complete the blood thinner medication as prescribed by your doctor.  Continue to use the breathing machine which will help keep your temperature down.  It is common for your temperature to cycle up and down following surgery, especially at night when you are not up moving around  and exerting yourself.  The breathing machine keeps your lungs expanded and your temperature down.  RANGE OF MOTION AND STRENGTHENING EXERCISES  These exercises are designed to help you keep full movement of your hip joint. Follow your caregiver's or physical therapist's instructions. Perform all exercises about fifteen times, three times per day or as directed. Exercise both hips, even if you have had only one joint replacement. These exercises can be done on a training (exercise) mat, on the floor, on a table or on a bed. Use whatever works the best  and is most comfortable for you. Use music or television while you are exercising so that the exercises are a pleasant break in your day. This will make your life better with the exercises acting as a break in routine you can look forward to.  Lying on your back, slowly slide your foot toward your buttocks, raising your knee up off the floor. Then slowly slide your foot back down until your leg is straight again.  Lying on your back spread your legs as far apart as you can without causing discomfort.  Lying on your side, raise your upper leg and foot straight up from the floor as far as is comfortable. Slowly lower the leg and repeat.  Lying on your back, tighten up the muscle in the front of your thigh (quadriceps muscles). You can do this by keeping your leg straight and trying to raise your heel off the floor. This helps strengthen the largest muscle supporting your knee.  Lying on your back, tighten up the muscles of your buttocks both with the legs straight and with the knee bent at a comfortable angle while keeping your heel on the floor.   SKILLED REHAB INSTRUCTIONS: If the patient is transferred to a skilled rehab facility following release from the hospital, a list of the current medications will be sent to the facility for the patient to continue.  When discharged from the skilled rehab facility, please have the facility set up the patient's Home Health Physical Therapy prior to being released. Also, the skilled facility will be responsible for providing the patient with their medications at time of release from the facility to include their pain medication, the muscle relaxants, and their blood thinner medication. If the patient is still at the rehab facility at time of the two week follow up appointment, the skilled rehab facility will also need to assist the patient in arranging follow up appointment in our office and any transportation needs.  MAKE SURE YOU:  Understand these instructions.    Will watch your condition.  Will get help right away if you are not doing well or get worse.  Pick up stool softner and laxative for home use following surgery while on pain medications. Do not submerge incision under water. Please use good hand washing techniques while changing dressing each day. May shower starting three days after surgery. Please use a clean towel to pat the incision dry following showers. Continue to use ice for pain and swelling after surgery. Do not use any lotions or creams on the incision until instructed by your surgeon. Total Hip Protocol.  Continue Lovenox injections for seven more days and then start a 325 mg Aspirin daily for three more weeks. After the three weeks of 325 mg Aspirin, then resume the home dosing of 81 mg Aspirin daily.  Postoperative Constipation Protocol  Constipation - defined medically as fewer than three stools per week and severe constipation as less than one  stool per week.  One of the most common issues patients have following surgery is constipation.  Even if you have a regular bowel pattern at home, your normal regimen is likely to be disrupted due to multiple reasons following surgery.  Combination of anesthesia, postoperative narcotics, change in appetite and fluid intake all can affect your bowels.  In order to avoid complications following surgery, here are some recommendations in order to help you during your recovery period.  Colace (docusate) - Pick up an over-the-counter form of Colace or another stool softener and take twice a day as long as you are requiring postoperative pain medications.  Take with a full glass of water daily.  If you experience loose stools or diarrhea, hold the colace until you stool forms back up.  If your symptoms do not get better within 1 week or if they get worse, check with your doctor.  Dulcolax (bisacodyl) - Pick up over-the-counter and take as directed by the product packaging as needed to assist  with the movement of your bowels.  Take with a full glass of water.  Use this product as needed if not relieved by Colace only.   MiraLax (polyethylene glycol) - Pick up over-the-counter to have on hand.  MiraLax is a solution that will increase the amount of water in your bowels to assist with bowel movements.  Take as directed and can mix with a glass of water, juice, soda, coffee, or tea.  Take if you go more than two days without a movement. Do not use MiraLax more than once per day. Call your doctor if you are still constipated or irregular after using this medication for 7 days in a row.  If you continue to have problems with postoperative constipation, please contact the office for further assistance and recommendations.  If you experience "the worst abdominal pain ever" or develop nausea or vomiting, please contact the office immediatly for further recommendations for treatment.

## 2015-01-07 NOTE — Discharge Summary (Signed)
Physician Discharge Summary   Patient ID: MIATA CULBRETH MRN: [id number] DOB/AGE: Jul 14, 1941 74 y.o.  Admit date: 01/05/2015 Discharge date: 01-07-2015  Primary Diagnosis:  Osteoarthritis of the Left hip.  Admission Diagnoses:  Past Medical History  Diagnosis Date  . Cataract     corrective surgery done  . Hypertension   . GERD (gastroesophageal reflux disease)   . Hyperlipidemia   . Sleep apnea     CPAP  . Hernia, hiatal   . Heart palpitations   . Arthritis     osteoarthritis-hips,knees, back, hands  . Hepatitis     past hx.,many yrs ago ? food source   Discharge Diagnoses:   Active Problems:   OA (osteoarthritis) of hip  Estimated body mass index is 44.41 kg/(m^2) as calculated from the following:   Height as of this encounter: _0  (1.727 m).   Weight as of this encounter: 132.45 kg (292 lb).  Procedure(s) (LRB): LEFT TOTAL HIP ARTHROPLASTY ANTERIOR APPROACH (Left)   Consults: None  HPI: Victoria Delgado is a 74 y.o. female who has advanced end-  stage arthritis of her Left hip with progressively worsening pain and  dysfunction.The patient has failed nonoperative management and presents for  total hip arthroplasty.  Laboratory Data: Admission on 01/05/2015  Component Date Value Ref Range Status  . WBC 01/06/2015 11.6* 4.0 - 10.5 K/uL Final  . RBC 01/06/2015 4.47  3.87 - 5.11 MIL/uL Final  . Hemoglobin 01/06/2015 12.9  12.0 - 15.0 g/dL Final  . HCT 01/06/2015 39.4  36.0 - 46.0 % Final  . MCV 01/06/2015 88.1  78.0 - 100.0 fL Final  . MCH 01/06/2015 28.9  26.0 - 34.0 pg Final  . MCHC 01/06/2015 32.7  30.0 - 36.0 g/dL Final  . RDW 01/06/2015 13.6  11.5 - 15.5 % Final  . Platelets 01/06/2015 223  150 - 400 K/uL Final  . Sodium 01/06/2015 141  135 - 145 mmol/L Final  . Potassium 01/06/2015 4.7  3.5 - 5.1 mmol/L Final  . Chloride 01/06/2015 109  96 - 112 mmol/L Final  . CO2 01/06/2015 24  19 - 32 mmol/L Final  . Glucose, Bld 01/06/2015 191* 70 - 99 mg/dL Final  .  BUN 01/06/2015 11  6 - 23 mg/dL Final  . Creatinine, Ser 01/06/2015 0.73  0.50 - 1.10 mg/dL Final  . Calcium 01/06/2015 9.0  8.4 - 10.5 mg/dL Final  . GFR calc non Af Amer 01/06/2015 84* >90 mL/min Final  . GFR calc Af Amer 01/06/2015 >90  >90 mL/min Final   Comment: (NOTE) The eGFR has been calculated using the CKD EPI equation. This calculation has not been validated in all clinical situations. eGFR's persistently <90 mL/min signify possible Chronic Kidney Disease.   . Anion gap 01/06/2015 8  5 - 15 Final  . WBC 01/07/2015 11.8* 4.0 - 10.5 K/uL Final  . RBC 01/07/2015 4.03  3.87 - 5.11 MIL/uL Final  . Hemoglobin 01/07/2015 11.6* 12.0 - 15.0 g/dL Final  . HCT 01/07/2015 36.5  36.0 - 46.0 % Final  . MCV 01/07/2015 90.6  78.0 - 100.0 fL Final  . MCH 01/07/2015 28.8  26.0 - 34.0 pg Final  . MCHC 01/07/2015 31.8  30.0 - 36.0 g/dL Final  . RDW 01/07/2015 14.4  11.5 - 15.5 % Final  . Platelets 01/07/2015 221  150 - 400 K/uL Final  . Sodium 01/07/2015 142  135 - 145 mmol/L Final  . Potassium 01/07/2015 4.2  3.5 - 5.1  mmol/L Final  . Chloride 01/07/2015 109  96 - 112 mmol/L Final  . CO2 01/07/2015 26  19 - 32 mmol/L Final  . Glucose, Bld 01/07/2015 168* 70 - 99 mg/dL Final  . BUN 01/07/2015 13  6 - 23 mg/dL Final  . Creatinine, Ser 01/07/2015 0.78  0.50 - 1.10 mg/dL Final  . Calcium 01/07/2015 8.5  8.4 - 10.5 mg/dL Final  . GFR calc non Af Amer 01/07/2015 82* >90 mL/min Final  . GFR calc Af Amer 01/07/2015 >90  >90 mL/min Final   Comment: (NOTE) The eGFR has been calculated using the CKD EPI equation. This calculation has not been validated in all clinical situations. eGFR's persistently <90 mL/min signify possible Chronic Kidney Disease.   Georgiann Hahn gap 01/07/2015 7  5 - 15 Final  Hospital Outpatient Visit on 12/27/2014  Component Date Value Ref Range Status  . MRSA, PCR 12/27/2014 NEGATIVE  NEGATIVE Final  . Staphylococcus aureus 12/27/2014 NEGATIVE  NEGATIVE Final   Comment:         The Xpert SA Assay (FDA approved for NASAL specimens in patients over 63 years of age), is one component of a comprehensive surveillance program.  Test performance has been validated by [facility] for patients greater than or equal to 2 year old. It is not intended to diagnose infection nor to guide or monitor treatment.   Marland Kitchen aPTT 12/27/2014 31  24 - 37 seconds Final  . WBC 12/27/2014 6.0  4.0 - 10.5 K/uL Final  . RBC 12/27/2014 4.80  3.87 - 5.11 MIL/uL Final  . Hemoglobin 12/27/2014 14.0  12.0 - 15.0 g/dL Final  . HCT 12/27/2014 42.9  36.0 - 46.0 % Final  . MCV 12/27/2014 89.4  78.0 - 100.0 fL Final  . MCH 12/27/2014 29.2  26.0 - 34.0 pg Final  . MCHC 12/27/2014 32.6  30.0 - 36.0 g/dL Final  . RDW 12/27/2014 14.0  11.5 - 15.5 % Final  . Platelets 12/27/2014 240  150 - 400 K/uL Final  . Sodium 12/27/2014 143  135 - 145 mmol/L Final  . Potassium 12/27/2014 4.8  3.5 - 5.1 mmol/L Final  . Chloride 12/27/2014 107  96 - 112 mmol/L Final  . CO2 12/27/2014 28  19 - 32 mmol/L Final  . Glucose, Bld 12/27/2014 127* 70 - 99 mg/dL Final  . BUN 12/27/2014 13  6 - 23 mg/dL Final  . Creatinine, Ser 12/27/2014 0.91  0.50 - 1.10 mg/dL Final  . Calcium 12/27/2014 9.3  8.4 - 10.5 mg/dL Final  . Total Protein 12/27/2014 7.0  6.0 - 8.3 g/dL Final  . Albumin 12/27/2014 4.0  3.5 - 5.2 g/dL Final  . AST 12/27/2014 21  0 - 37 U/L Final  . ALT 12/27/2014 19  0 - 35 U/L Final  . Alkaline Phosphatase 12/27/2014 90  39 - 117 U/L Final  . Total Bilirubin 12/27/2014 0.5  0.3 - 1.2 mg/dL Final  . GFR calc non Af Amer 12/27/2014 62* >90 mL/min Final  . GFR calc Af Amer 12/27/2014 72* >90 mL/min Final   Comment: (NOTE) The eGFR has been calculated using the CKD EPI equation. This calculation has not been validated in all clinical situations. eGFR's persistently <90 mL/min signify possible Chronic Kidney Disease.   . Anion gap 12/27/2014 8  5 - 15 Final  . Prothrombin Time 12/27/2014 13.6  11.6 - 15.2  seconds Final  . INR 12/27/2014 1.03  0.00 - 1.49 Final  . ABO/RH(D) 12/27/2014  A POS   Final  . Antibody Screen 12/27/2014 NEG   Final  . Sample Expiration 12/27/2014 01/08/2015   Final  . Color, Urine 12/27/2014 YELLOW  YELLOW Final  . APPearance 12/27/2014 CLOUDY* CLEAR Final  . Specific Gravity, Urine 12/27/2014 1.021  1.005 - 1.030 Final  . pH 12/27/2014 7.0  5.0 - 8.0 Final  . Glucose, UA 12/27/2014 NEGATIVE  NEGATIVE mg/dL Final  . Hgb urine dipstick 12/27/2014 NEGATIVE  NEGATIVE Final  . Bilirubin Urine 12/27/2014 NEGATIVE  NEGATIVE Final  . Ketones, ur 12/27/2014 NEGATIVE  NEGATIVE mg/dL Final  . Protein, ur 12/27/2014 NEGATIVE  NEGATIVE mg/dL Final  . Urobilinogen, UA 12/27/2014 1.0  0.0 - 1.0 mg/dL Final  . Nitrite 12/27/2014 NEGATIVE  NEGATIVE Final  . Leukocytes, UA 12/27/2014 MODERATE* NEGATIVE Final  . WBC, UA 12/27/2014 3-6  <3 WBC/hpf Final  . Bacteria, UA 12/27/2014 FEW* RARE Final  . Urine-Other 12/27/2014 MUCOUS PRESENT   Final     X-Rays:Dg Pelvis Portable  01/05/2015   CLINICAL DATA:  Postop left anterior total hip arthroplasty.  EXAM: PORTABLE PELVIS 1-2 VIEWS; DG C-ARM 1-60 MIN - NRPT [facility]  COMPARISON:  Pelvic radiographs 08/04/2014.  FLUOROSCOPY TIME:  C-arm fluoroscopic images were obtained intraoperatively and submitted for post operative interpretation. Please see the performing provider's procedural report for the fluoroscopy time utilized.  FINDINGS: Interval left total hip arthroplasty. The hardware appears well positioned. There is a surgical drain in place. A small amount of gas is present within the left hip joint and immediate surrounding soft tissues. There is no evidence of acute fracture or dislocation. The visualized right total hip arthroplasty has a stable appearance.  IMPRESSION: No demonstrated complication following left total hip arthroplasty.   Electronically Signed   By: Camie Patience M.D.   On: 01/05/2015 16:10   Dg C-arm 1-60 Min-no  Report  01/05/2015   CLINICAL DATA:  Postop left anterior total hip arthroplasty.  EXAM: PORTABLE PELVIS 1-2 VIEWS; DG C-ARM 1-60 MIN - NRPT [facility]  COMPARISON:  Pelvic radiographs 08/04/2014.  FLUOROSCOPY TIME:  C-arm fluoroscopic images were obtained intraoperatively and submitted for post operative interpretation. Please see the performing provider's procedural report for the fluoroscopy time utilized.  FINDINGS: Interval left total hip arthroplasty. The hardware appears well positioned. There is a surgical drain in place. A small amount of gas is present within the left hip joint and immediate surrounding soft tissues. There is no evidence of acute fracture or dislocation. The visualized right total hip arthroplasty has a stable appearance.  IMPRESSION: No demonstrated complication following left total hip arthroplasty.   Electronically Signed   By: Camie Patience M.D.   On: 01/05/2015 16:10   Dg Hip Unilat With Pelvis 2-3 Views Left  12/27/2014   CLINICAL DATA:  Osteoarthritis of left hip.  Severe left hip pain.  EXAM: DG HIP W/ PELVIS 2-3V*L*  COMPARISON:  August 04, 2014.  FINDINGS: Status post right total hip arthroplasty. No fracture or dislocation is noted. Mild narrowing and osteophyte formation is seen involving the left hip joint. Phlebolith is noted in the pelvis. Sacroiliac joints appear normal.  IMPRESSION: Mild degenerative joint disease of the left hip. Status post right total hip arthroplasty.   Electronically Signed   By: Sabino Dick M.D.   On: 12/27/2014 10:14    EKG: Orders placed or performed in visit on 04/30/14  . EKG 12-Lead     Hospital Course: Patient was admitted to [facility] and taken  to the OR and underwent the above state procedure without complications.  Patient tolerated the procedure well and was later transferred to the recovery room and then to the orthopaedic floor for postoperative care.  They were given PO and IV analgesics for pain control following their  surgery.  They were given 24 hours of postoperative antibiotics of  Anti-infectives    Start     Dose/Rate Route Frequency Ordered Stop   01/07/15 0000  ciprofloxacin (CIPRO) 500 MG tablet     500 mg Oral 2 times daily 01/07/15 0909     01/06/15 0200  vancomycin (VANCOCIN) IVPB 1000 mg/200 mL premix     1,000 mg200 mL/hr over 60 Minutes Intravenous Every 12 hours 01/05/15 1702 01/06/15 0319   01/05/15 2000  ciprofloxacin (CIPRO) tablet 500 mg     500 mg Oral 2 times daily 01/05/15 1702     01/05/15 1315  vancomycin (VANCOCIN) 1,500 mg in sodium chloride 0.9 % 500 mL IVPB     1,500 mg250 mL/hr over 120 Minutes Intravenous  Once 01/05/15 1312 01/05/15 1345   01/05/15 1300  ciprofloxacin (CIPRO) IVPB 400 mg  Status:  Discontinued     400 mg200 mL/hr over 60 Minutes Intravenous Every 12 hours 01/05/15 1257 01/05/15 1646   01/05/15 1118  ceFAZolin (ANCEF) IVPB 2 g/50 mL premix  Status:  Discontinued     2 g100 mL/hr over 30 Minutes Intravenous On call to O.R. 01/05/15 1118 01/05/15 1312     and started on DVT prophylaxis in the form of Xarelto but later switched over to Lovenox injections after conferring with the patient and pharmacy.   PT and OT were ordered for total hip protocol.  The patient was allowed to be WBAT with therapy. Discharge planning was consulted to help with postop disposition and equipment needs.  Patient had a decent night on the evening of surgery.  They started to get up OOB with therapy on day one.  Hemovac drain was pulled without difficulty.  Continued to work with therapy into day two.  Dressing was changed on day two and the incision was healing well. Patient was seen in rounds and was ready to go home.  Discharge home with home health Diet - Cardiac diet Follow up - in 2 weeks Activity - WBAT Disposition - Home Condition Upon Discharge - Good D/C Meds - See DC Summary DVT Prophylaxis - Lovenox for one more week, then Aspirin 325 mg daily for three weeks and then  resume the daily baby 81 mg Aspirin.  Discharge Instructions    Call MD / Call 911    Complete by:  As directed   If you experience chest pain or shortness of breath, CALL 911 and be transported to the hospital emergency room.  If you develope a fever above 101 F, pus (white drainage) or increased drainage or redness at the wound, or calf pain, call your surgeon's office.     Change dressing    Complete by:  As directed   You may change your dressing dressing daily with sterile 4 x 4 inch gauze dressing and paper tape.  Do not submerge the incision under water.     Constipation Prevention    Complete by:  As directed   Drink plenty of fluids.  Prune juice may be helpful.  You may use a stool softener, such as Colace (over the counter) 100 mg twice a day.  Use MiraLax (over the counter) for constipation as needed.  Diet - low sodium heart healthy    Complete by:  As directed      Discharge instructions    Complete by:  As directed   Pick up stool softner and laxative for home use following surgery while on pain medications. Do not submerge incision under water. Please use good hand washing techniques while changing dressing each day. May shower starting three days after surgery. Please use a clean towel to pat the incision dry following showers. Continue to use ice for pain and swelling after surgery. Do not use any lotions or creams on the incision until instructed by your surgeon. Hip precautions.  Total Hip Protocol.  Continue Lovenox for seven more days and then start a 325 mg Aspirin daily for three more weeks. After the three weeks of 325 mg Aspirin, then resume the home dosing of 81 mg Aspirin daily.  Postoperative Constipation Protocol  Constipation - defined medically as fewer than three stools per week and severe constipation as less than one stool per week.  One of the most common issues patients have following surgery is constipation.  Even if you have a regular bowel  pattern at home, your normal regimen is likely to be disrupted due to multiple reasons following surgery.  Combination of anesthesia, postoperative narcotics, change in appetite and fluid intake all can affect your bowels.  In order to avoid complications following surgery, here are some recommendations in order to help you during your recovery period.  Colace (docusate) - Pick up an over-the-counter form of Colace or another stool softener and take twice a day as long as you are requiring postoperative pain medications.  Take with a full glass of water daily.  If you experience loose stools or diarrhea, hold the colace until you stool forms back up.  If your symptoms do not get better within 1 week or if they get worse, check with your doctor.  Dulcolax (bisacodyl) - Pick up over-the-counter and take as directed by the product packaging as needed to assist with the movement of your bowels.  Take with a full glass of water.  Use this product as needed if not relieved by Colace only.   MiraLax (polyethylene glycol) - Pick up over-the-counter to have on hand.  MiraLax is a solution that will increase the amount of water in your bowels to assist with bowel movements.  Take as directed and can mix with a glass of water, juice, soda, coffee, or tea.  Take if you go more than two days without a movement. Do not use MiraLax more than once per day. Call your doctor if you are still constipated or irregular after using this medication for 7 days in a row.  If you continue to have problems with postoperative constipation, please contact the office for further assistance and recommendations.  If you experience "the worst abdominal pain ever" or develop nausea or vomiting, please contact the office immediatly for further recommendations for treatment.     Do not sit on low chairs, stoools or toilet seats, as it may be difficult to get up from low surfaces    Complete by:  As directed      Driving restrictions     Complete by:  As directed   No driving until released by the physician.     Increase activity slowly as tolerated    Complete by:  As directed      Lifting restrictions    Complete by:  As directed   No  lifting until released by the physician.     Patient may shower    Complete by:  As directed   You may shower without a dressing once there is no drainage.  Do not wash over the wound.  If drainage remains, do not shower until drainage stops.     TED hose    Complete by:  As directed   Use stockings (TED hose) for 3 weeks on both leg(s).  You may remove them at night for sleeping.     Weight bearing as tolerated    Complete by:  As directed   Laterality:  left  Extremity:  Lower            Medication List    STOP taking these medications        aspirin EC 81 MG tablet     doxycycline 100 MG capsule  Commonly known as:  VIBRAMYCIN     oxyCODONE 5 MG immediate release tablet  Commonly known as:  Oxy IR/ROXICODONE     potassium chloride 10 MEQ tablet  Commonly known as:  K-DUR      TAKE these medications        acetaminophen 500 MG tablet  Commonly known as:  TYLENOL  Take 1,000 mg by mouth every 6 (six) hours as needed for moderate pain.     ALREX 0.2 % Susp  Generic drug:  loteprednol  Place 1 drop into the left eye daily as needed (irritation.).     ciprofloxacin 500 MG tablet  Commonly known as:  CIPRO  Take 1 tablet (500 mg total) by mouth 2 (two) times daily.     diltiazem 240 MG 24 hr capsule  Commonly known as:  CARDIZEM CD  Take 240 mg by mouth at bedtime.     enoxaparin 40 MG/0.4ML injection  Commonly known as:  LOVENOX  Inject 0.4 mLs (40 mg total) into the skin daily. Continue Lovenox for seven more days and then start a 325 mg Aspirin daily for three more weeks.     furosemide 40 MG tablet  Commonly known as:  LASIX  TAKE 1 TABLET BY MOUTH DAILY     HYDROmorphone 2 MG tablet  Commonly known as:  DILAUDID  Take 1-2 tablets (2-4 mg total) by  mouth every 4 (four) hours as needed for severe pain.     methocarbamol 500 MG tablet  Commonly known as:  ROBAXIN  Take 1 tablet (500 mg total) by mouth every 6 (six) hours as needed for muscle spasms.     metoprolol succinate 50 MG 24 hr tablet  Commonly known as:  TOPROL-XL  Takes 75m in am, 238min pm.--Name Brand     potassium chloride SA 20 MEQ tablet  Commonly known as:  K-DUR,KLOR-CON  TAKE 1 TABLET BY MOUTH EVERY DAY     SYSTANE BALANCE OP  Apply 2-3 drops to eye 3 (three) times daily as needed (dryness.).     ZANTAC 150 MG tablet  Generic drug:  ranitidine  Take 150 mg by mouth 2 (two) times daily.           Follow-up Information    [facility]  Why:  home health physical therapy   Contact information:   [address] [phone]     Follow up with [facility]. Schedule an appointment as soon as possible for a visit on 01/18/2015.   Specialty:  Orthopedic Surgery   Why:  call office at [phone] to setup appointment on Tuesday 01/18/2015 with Dr. Wynelle Link.   Contact information:   [address] [place] [address] [phone]       Signed: Arlee Muslim, PA-C Orthopaedic Surgery 01/07/2015, 9:11 AM

## 2015-01-07 NOTE — Progress Notes (Signed)
Physical Therapy Treatment Patient Details Name: Victoria Delgado MRN: [id number] DOB: 1941/04/14 Today's Date: 01/07/2015    History of Present Illness L THR; s/p R THR 9/15    PT Comments    Progressing well and eager for dc today.  Reviewed stairs and car transfers with pt.  Follow Up Recommendations  Home health PT     Equipment Recommendations  None recommended by PT    Recommendations for Other Services OT consult     Precautions / Restrictions Precautions Precautions: Fall Restrictions Weight Bearing Restrictions: No Other Position/Activity Restrictions: WBAT    Mobility  Bed Mobility Overal bed mobility: Needs Assistance Bed Mobility: Supine to Sit;Sit to Supine     Supine to sit: Min guard Sit to supine: Min assist   General bed mobility comments: cues for sequence and use of R LE to self assist  Transfers Overall transfer level: Needs assistance Equipment used: Rolling walker (2 wheeled) Transfers: Sit to/from Stand Sit to Stand: Supervision         General transfer comment: cues for LE management and use of UEs to self assistq  Ambulation/Gait Ambulation/Gait assistance: Min guard;Supervision Ambulation Distance (Feet): 200 Feet Assistive device: Rolling walker (2 wheeled) Gait Pattern/deviations: Step-to pattern;Step-through pattern;Decreased step length - right;Decreased step length - left;Shuffle;Trunk flexed     General Gait Details: cues for posture, position from RW and initial sequence   Stairs Stairs: Yes Stairs assistance: Min assist Stair Management: No rails;Step to pattern;Backwards;With walker Number of Stairs: 2 General stair comments: cues for sequence and foot/RW placement.  Pt attempted with rail and cane but unable to sufficiently WB on cane.  Wheelchair Mobility    Modified Rankin (Stroke Patients Only)       Balance                                    Cognition Arousal/Alertness:  Awake/alert Behavior During Therapy: WFL for tasks assessed/performed Overall Cognitive Status: Within Functional Limits for tasks assessed                      Exercises Total Joint Exercises Ankle Circles/Pumps: AROM;Both;15 reps;Supine Quad Sets: AROM;Both;10 reps;Supine Gluteal Sets: AROM;Both;10 reps;Supine Heel Slides: AAROM;Left;Supine;20 reps Hip ABduction/ADduction: AAROM;Left;Supine;15 reps    General Comments        Pertinent Vitals/Pain Pain Assessment: 0-10 Pain Score: 3  Pain Location: L hip Pain Descriptors / Indicators: Aching;Sore Pain Intervention(s): Limited activity within patient's tolerance;Monitored during session;Ice applied;Premedicated before session    Home Living                      Prior Function            PT Goals (current goals can now be found in the care plan section) Acute Rehab PT Goals Patient Stated Goal: Resume previous lifestyle with decreased pain PT Goal Formulation: With patient Time For Goal Achievement: 01/13/15 Potential to Achieve Goals: Good Progress towards PT goals: Progressing toward goals    Frequency  7X/week    PT Plan Current plan remains appropriate    Co-evaluation             End of Session   Activity Tolerance: Patient tolerated treatment well Patient left: in bed;with call bell/phone within reach     Time: 9147-8295 PT Time Calculation (min) (ACUTE ONLY): 38 min  Charges:  $Gait Training: 8-22 mins $  Therapeutic Exercise: 8-22 mins $Therapeutic Activity: 8-22 mins                    G Codes:      Marisella Puccio 01-31-2015, 11:16 AM

## 2015-01-07 NOTE — Progress Notes (Signed)
   Subjective: 2 Days Post-Op Procedure(s) (LRB): LEFT TOTAL HIP ARTHROPLASTY ANTERIOR APPROACH (Left) Patient reports pain as mild.   Patient seen in rounds for Dr. Lequita Halt. Patient is well, and has had no acute complaints or problems Patient is ready to go home later today.  Objective: Vital signs in last 24 hours: Temp:  [97.3 F (36.3 C)-98.8 F (37.1 C)] 98 F (36.7 C) (02/05 0624) Pulse Rate:  [54-65] 65 (02/05 0832) Resp:  [16-20] 20 (02/05 0624) BP: (110-141)/(39-49) 120/48 mmHg (02/05 0624) SpO2:  [96 %-99 %] 98 % (02/05 0624)  Intake/Output from previous day:  Intake/Output Summary (Last 24 hours) at 01/07/15 0857 Last data filed at 01/07/15 0811  Gross per 24 hour  Intake 1817.5 ml  Output      0 ml  Net 1817.5 ml    Intake/Output this shift: Total I/O In: 240 [P.O.:240] Out: -   Labs:  Recent Labs  01/06/15 0527 01/07/15 0524  HGB 12.9 11.6*    Recent Labs  01/06/15 0527 01/07/15 0524  WBC 11.6* 11.8*  RBC 4.47 4.03  HCT 39.4 36.5  PLT 223 221    Recent Labs  01/06/15 0527 01/07/15 0524  NA 141 142  K 4.7 4.2  CL 109 109  CO2 24 26  BUN 11 13  CREATININE 0.73 0.78  GLUCOSE 191* 168*  CALCIUM 9.0 8.5   No results for input(s): LABPT, INR in the last 72 hours.  EXAM: General - Patient is Alert, Appropriate and Oriented Extremity - Neurovascular intact Sensation intact distally Dorsiflexion/Plantar flexion intact Incision - clean, dry, no drainage, healing Motor Function - intact, moving foot and toes well on exam.   Assessment/Plan: 2 Days Post-Op Procedure(s) (LRB): LEFT TOTAL HIP ARTHROPLASTY ANTERIOR APPROACH (Left) Procedure(s) (LRB): LEFT TOTAL HIP ARTHROPLASTY ANTERIOR APPROACH (Left) Past Medical History  Diagnosis Date  . Cataract     corrective surgery done  . Hypertension   . GERD (gastroesophageal reflux disease)   . Hyperlipidemia   . Sleep apnea     CPAP  . Hernia, hiatal   . Heart palpitations   .  Arthritis     osteoarthritis-hips,knees, back, hands  . Hepatitis     past hx.,many yrs ago ? food source   Active Problems:   OA (osteoarthritis) of hip  Estimated body mass index is 44.41 kg/(m^2) as calculated from the following:   Height as of this encounter:  (1.727 m).   Weight as of this encounter: 132.45 kg (292 lb). Up with therapy Discharge home with home health Diet - Cardiac diet Follow up - in 2 weeks Activity - WBAT Disposition - Home Condition Upon Discharge - Good D/C Meds - See DC Summary DVT Prophylaxis - Lovenox for one more week, then Aspirin 325 mg daily for three weeks and then resume the daily baby 81 mg Aspirin.  Avel Peace, PA-C Orthopaedic Surgery 01/07/2015, 8:57 AM

## 2015-01-08 DIAGNOSIS — I421 Obstructive hypertrophic cardiomyopathy: Secondary | ICD-10-CM | POA: Diagnosis not present

## 2015-01-08 DIAGNOSIS — K219 Gastro-esophageal reflux disease without esophagitis: Secondary | ICD-10-CM | POA: Diagnosis not present

## 2015-01-08 DIAGNOSIS — G4730 Sleep apnea, unspecified: Secondary | ICD-10-CM | POA: Diagnosis not present

## 2015-01-08 DIAGNOSIS — G609 Hereditary and idiopathic neuropathy, unspecified: Secondary | ICD-10-CM | POA: Diagnosis not present

## 2015-01-08 DIAGNOSIS — Z96642 Presence of left artificial hip joint: Secondary | ICD-10-CM | POA: Diagnosis not present

## 2015-01-08 DIAGNOSIS — Z96641 Presence of right artificial hip joint: Secondary | ICD-10-CM | POA: Diagnosis not present

## 2015-01-08 DIAGNOSIS — Z471 Aftercare following joint replacement surgery: Secondary | ICD-10-CM | POA: Diagnosis not present

## 2015-01-08 DIAGNOSIS — E785 Hyperlipidemia, unspecified: Secondary | ICD-10-CM | POA: Diagnosis not present

## 2015-01-08 DIAGNOSIS — I10 Essential (primary) hypertension: Secondary | ICD-10-CM | POA: Diagnosis not present

## 2015-01-10 DIAGNOSIS — G609 Hereditary and idiopathic neuropathy, unspecified: Secondary | ICD-10-CM | POA: Diagnosis not present

## 2015-01-10 DIAGNOSIS — I421 Obstructive hypertrophic cardiomyopathy: Secondary | ICD-10-CM | POA: Diagnosis not present

## 2015-01-10 DIAGNOSIS — K219 Gastro-esophageal reflux disease without esophagitis: Secondary | ICD-10-CM | POA: Diagnosis not present

## 2015-01-10 DIAGNOSIS — G4730 Sleep apnea, unspecified: Secondary | ICD-10-CM | POA: Diagnosis not present

## 2015-01-10 DIAGNOSIS — I10 Essential (primary) hypertension: Secondary | ICD-10-CM | POA: Diagnosis not present

## 2015-01-10 DIAGNOSIS — Z471 Aftercare following joint replacement surgery: Secondary | ICD-10-CM | POA: Diagnosis not present

## 2015-01-10 DIAGNOSIS — Z96641 Presence of right artificial hip joint: Secondary | ICD-10-CM | POA: Diagnosis not present

## 2015-01-10 DIAGNOSIS — E785 Hyperlipidemia, unspecified: Secondary | ICD-10-CM | POA: Diagnosis not present

## 2015-01-10 DIAGNOSIS — Z96642 Presence of left artificial hip joint: Secondary | ICD-10-CM | POA: Diagnosis not present

## 2015-01-11 DIAGNOSIS — Z96641 Presence of right artificial hip joint: Secondary | ICD-10-CM | POA: Diagnosis not present

## 2015-01-11 DIAGNOSIS — G4730 Sleep apnea, unspecified: Secondary | ICD-10-CM | POA: Diagnosis not present

## 2015-01-11 DIAGNOSIS — K219 Gastro-esophageal reflux disease without esophagitis: Secondary | ICD-10-CM | POA: Diagnosis not present

## 2015-01-11 DIAGNOSIS — I10 Essential (primary) hypertension: Secondary | ICD-10-CM | POA: Diagnosis not present

## 2015-01-11 DIAGNOSIS — G609 Hereditary and idiopathic neuropathy, unspecified: Secondary | ICD-10-CM | POA: Diagnosis not present

## 2015-01-11 DIAGNOSIS — E785 Hyperlipidemia, unspecified: Secondary | ICD-10-CM | POA: Diagnosis not present

## 2015-01-11 DIAGNOSIS — Z96642 Presence of left artificial hip joint: Secondary | ICD-10-CM | POA: Diagnosis not present

## 2015-01-11 DIAGNOSIS — I421 Obstructive hypertrophic cardiomyopathy: Secondary | ICD-10-CM | POA: Diagnosis not present

## 2015-01-11 DIAGNOSIS — Z471 Aftercare following joint replacement surgery: Secondary | ICD-10-CM | POA: Diagnosis not present

## 2015-01-12 ENCOUNTER — Telehealth: Payer: Self-pay | Admitting: Cardiology

## 2015-01-12 NOTE — Telephone Encounter (Signed)
Victoria Delgado is calling because her insurance company needs a letter stating that she has to take the name brand of Toprol XL and not the generic . Please cal if you any questions .    Thanks

## 2015-01-13 DIAGNOSIS — G4730 Sleep apnea, unspecified: Secondary | ICD-10-CM | POA: Diagnosis not present

## 2015-01-13 DIAGNOSIS — I421 Obstructive hypertrophic cardiomyopathy: Secondary | ICD-10-CM | POA: Diagnosis not present

## 2015-01-13 DIAGNOSIS — I10 Essential (primary) hypertension: Secondary | ICD-10-CM | POA: Diagnosis not present

## 2015-01-13 DIAGNOSIS — K219 Gastro-esophageal reflux disease without esophagitis: Secondary | ICD-10-CM | POA: Diagnosis not present

## 2015-01-13 DIAGNOSIS — Z471 Aftercare following joint replacement surgery: Secondary | ICD-10-CM | POA: Diagnosis not present

## 2015-01-13 DIAGNOSIS — E785 Hyperlipidemia, unspecified: Secondary | ICD-10-CM | POA: Diagnosis not present

## 2015-01-13 DIAGNOSIS — Z96642 Presence of left artificial hip joint: Secondary | ICD-10-CM | POA: Diagnosis not present

## 2015-01-13 DIAGNOSIS — Z96641 Presence of right artificial hip joint: Secondary | ICD-10-CM | POA: Diagnosis not present

## 2015-01-13 DIAGNOSIS — G609 Hereditary and idiopathic neuropathy, unspecified: Secondary | ICD-10-CM | POA: Diagnosis not present

## 2015-01-18 DIAGNOSIS — E785 Hyperlipidemia, unspecified: Secondary | ICD-10-CM | POA: Diagnosis not present

## 2015-01-18 DIAGNOSIS — I421 Obstructive hypertrophic cardiomyopathy: Secondary | ICD-10-CM | POA: Diagnosis not present

## 2015-01-18 DIAGNOSIS — Z96641 Presence of right artificial hip joint: Secondary | ICD-10-CM | POA: Diagnosis not present

## 2015-01-18 DIAGNOSIS — Z471 Aftercare following joint replacement surgery: Secondary | ICD-10-CM | POA: Diagnosis not present

## 2015-01-18 DIAGNOSIS — I10 Essential (primary) hypertension: Secondary | ICD-10-CM | POA: Diagnosis not present

## 2015-01-18 DIAGNOSIS — K219 Gastro-esophageal reflux disease without esophagitis: Secondary | ICD-10-CM | POA: Diagnosis not present

## 2015-01-18 DIAGNOSIS — G609 Hereditary and idiopathic neuropathy, unspecified: Secondary | ICD-10-CM | POA: Diagnosis not present

## 2015-01-18 DIAGNOSIS — Z96642 Presence of left artificial hip joint: Secondary | ICD-10-CM | POA: Diagnosis not present

## 2015-01-18 DIAGNOSIS — G4730 Sleep apnea, unspecified: Secondary | ICD-10-CM | POA: Diagnosis not present

## 2015-01-18 NOTE — Telephone Encounter (Signed)
Message sent to Dr.Hochrein's nurse JC. 

## 2015-01-19 DIAGNOSIS — Z96642 Presence of left artificial hip joint: Secondary | ICD-10-CM | POA: Diagnosis not present

## 2015-01-19 DIAGNOSIS — K219 Gastro-esophageal reflux disease without esophagitis: Secondary | ICD-10-CM | POA: Diagnosis not present

## 2015-01-19 DIAGNOSIS — I421 Obstructive hypertrophic cardiomyopathy: Secondary | ICD-10-CM | POA: Diagnosis not present

## 2015-01-19 DIAGNOSIS — E785 Hyperlipidemia, unspecified: Secondary | ICD-10-CM | POA: Diagnosis not present

## 2015-01-19 DIAGNOSIS — Z96641 Presence of right artificial hip joint: Secondary | ICD-10-CM | POA: Diagnosis not present

## 2015-01-19 DIAGNOSIS — I10 Essential (primary) hypertension: Secondary | ICD-10-CM | POA: Diagnosis not present

## 2015-01-19 DIAGNOSIS — Z471 Aftercare following joint replacement surgery: Secondary | ICD-10-CM | POA: Diagnosis not present

## 2015-01-19 DIAGNOSIS — G4730 Sleep apnea, unspecified: Secondary | ICD-10-CM | POA: Diagnosis not present

## 2015-01-19 DIAGNOSIS — G609 Hereditary and idiopathic neuropathy, unspecified: Secondary | ICD-10-CM | POA: Diagnosis not present

## 2015-01-20 ENCOUNTER — Encounter: Payer: Self-pay | Admitting: *Deleted

## 2015-01-20 ENCOUNTER — Telehealth: Payer: Self-pay | Admitting: Cardiology

## 2015-01-20 ENCOUNTER — Other Ambulatory Visit: Payer: Self-pay | Admitting: *Deleted

## 2015-01-20 DIAGNOSIS — Z96642 Presence of left artificial hip joint: Secondary | ICD-10-CM | POA: Diagnosis not present

## 2015-01-20 DIAGNOSIS — I421 Obstructive hypertrophic cardiomyopathy: Secondary | ICD-10-CM | POA: Diagnosis not present

## 2015-01-20 DIAGNOSIS — K219 Gastro-esophageal reflux disease without esophagitis: Secondary | ICD-10-CM | POA: Diagnosis not present

## 2015-01-20 DIAGNOSIS — I10 Essential (primary) hypertension: Secondary | ICD-10-CM | POA: Diagnosis not present

## 2015-01-20 DIAGNOSIS — E785 Hyperlipidemia, unspecified: Secondary | ICD-10-CM | POA: Diagnosis not present

## 2015-01-20 DIAGNOSIS — G4730 Sleep apnea, unspecified: Secondary | ICD-10-CM | POA: Diagnosis not present

## 2015-01-20 DIAGNOSIS — Z96641 Presence of right artificial hip joint: Secondary | ICD-10-CM | POA: Diagnosis not present

## 2015-01-20 DIAGNOSIS — Z471 Aftercare following joint replacement surgery: Secondary | ICD-10-CM | POA: Diagnosis not present

## 2015-01-20 DIAGNOSIS — G609 Hereditary and idiopathic neuropathy, unspecified: Secondary | ICD-10-CM | POA: Diagnosis not present

## 2015-01-20 NOTE — Telephone Encounter (Signed)
Trey Paula need a dx code for the pt

## 2015-01-20 NOTE — Telephone Encounter (Signed)
[employer] called for ICD 10 code for patient's Prior Authorization. Verbal confirmation on phone that this PA was approved.

## 2015-01-20 NOTE — Telephone Encounter (Signed)
Code given.

## 2015-01-20 NOTE — Telephone Encounter (Signed)
Letter sent to [employer], pt. Called and informed

## 2015-01-24 ENCOUNTER — Telehealth: Payer: Self-pay | Admitting: *Deleted

## 2015-01-24 NOTE — Telephone Encounter (Signed)
Victoria Delgado called and said that she had a UTI before

## 2015-01-25 ENCOUNTER — Encounter: Payer: Self-pay | Admitting: Internal Medicine

## 2015-01-25 ENCOUNTER — Ambulatory Visit (INDEPENDENT_AMBULATORY_CARE_PROVIDER_SITE_OTHER): Payer: Medicare Other | Admitting: Internal Medicine

## 2015-01-25 VITALS — BP 139/56 | HR 70 | Resp 16 | Ht 68.0 in

## 2015-01-25 DIAGNOSIS — N760 Acute vaginitis: Secondary | ICD-10-CM

## 2015-01-25 DIAGNOSIS — L298 Other pruritus: Secondary | ICD-10-CM

## 2015-01-25 DIAGNOSIS — N898 Other specified noninflammatory disorders of vagina: Secondary | ICD-10-CM

## 2015-01-25 LAB — POCT URINALYSIS DIPSTICK
BILIRUBIN UA: NEGATIVE
GLUCOSE UA: NEGATIVE
Ketones, UA: NEGATIVE
NITRITE UA: NEGATIVE
RBC UA: NEGATIVE
SPEC GRAV UA: 1.01
Urobilinogen, UA: NEGATIVE
pH, UA: 6.5

## 2015-01-25 MED ORDER — CLOTRIMAZOLE 2 % VA CREA: TOPICAL_CREAM | VAGINAL | Status: DC

## 2015-01-25 MED ORDER — FLUCONAZOLE 150 MG PO TABS
ORAL_TABLET | ORAL | Status: DC
Start: 2015-01-25 — End: 2015-07-07

## 2015-01-25 NOTE — Progress Notes (Signed)
Subjective:    Patient ID: Victoria Delgado, female    DOB: [birth year], 74 y.o.   MRN: [id number]  HPI  09/2014  Urgent care note  Assessment & Plan:   Abscess of abdominal wall  Infected sebaceous cyst S/P I&D.   Wound healing appropriately with no signs of infection. Persistent induration is consistent with healing. No packing placed today. Gauze placed over wound. Instructed to continue to apply warm compress to site daily until doxycycline is finished. Patient instructed to RTC with any signs of infection or concerns.         04/2014 note  Assessment & Plan:  HM Will schedule 3d Mm at [facility] She declines colonoscopy and Zostavax. Quit smoking 25 years ago  HTn continue meds  Advanced DJD Upcoming appt with Dr. Despina Delgado  HYperlipidemia She cannot tolerate statins She wishes to try red yeast rice. ADvised that it has the same SE profile and if she wishes to try see me in 4-6 weeks for lliver profile She voices understanding  Obesity  HOCM   Fatty liver  Peripheral neuropathy ?? Etiology   See me in 4-6 weeks after initiating Red yeast rice          TODAY  Victoria Delgado is here for acute visit.  She had her R THR done 2/3 .  Was found to have UTI at that time .   She was given 7 days course of Cipro.   She also had IV Vancomycin during her surgery .    She has lots of vaginal itching  No vaginal discharge.    No fever no dysuria or urgency  She cannot get into proper position for exam and asks that she not be examined today   Allergies  Allergen Reactions  . Codeine Nausea Only  . Kenalog [Triamcinolone Acetonide]     Had A.fib after she received the injection."Steroid meds"  . Relafen [Nabumetone]     Edema   . Statins     Possible myositis   . Tramadol     Auditory halllucinations  . Penicillins Rash    Years ago, injection site was red and swollen.   Past Medical History  Diagnosis Date  . Cataract     corrective surgery done  .  Hypertension   . GERD (gastroesophageal reflux disease)   . Hyperlipidemia   . Sleep apnea     CPAP  . Hernia, hiatal   . Heart palpitations   . Arthritis     osteoarthritis-hips,knees, back, hands  . Hepatitis     past hx.,many yrs ago ? food source   Past Surgical History  Procedure Laterality Date  . Appendectomy  2004  . Cholecystectomy    . Retinal detachment surgery Bilateral     remains with slight hazy vision with lights  . Cataract extraction w/ intraocular lens implant      both eyes  . Total hip arthroplasty Right 08/04/2014    Procedure: RIGHT TOTAL HIP ARTHROPLASTY ANTERIOR APPROACH;  Surgeon: Victoria Drilling, MD;  Location: [facility];  Service: Orthopedics;  Laterality: Right;  . Total hip arthroplasty Left 01/05/2015    Procedure: LEFT TOTAL HIP ARTHROPLASTY ANTERIOR APPROACH;  Surgeon: Victoria Drilling, MD;  Location: [facility];  Service: Orthopedics;  Laterality: Left;   History   Social History  . Marital Status: Married    Spouse Name: N/A  . Number of Children: 2  . Years of Education: N/A   Occupational History  .  Other   Social History Main Topics  . Smoking status: Former Games developer  . Smokeless tobacco: Never Used     Comment: QUIT IN 86  . Alcohol Use: No  . Drug Use: No  . Sexual Activity: Yes   Other Topics Concern  . Not on file   Social History Narrative   Two grandchildren.            Family History  Problem Relation Age of Onset  . Cancer Father 2  . Hypertension Mother   . CVA Mother    Patient Active Problem List   Diagnosis Date Noted  . OA (osteoarthritis) of hip 08/04/2014  . Edema 08/17/2013  . H/O bone density study 03/16/2013  . 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 Unspecified hereditary and idiopathic peripheral neuropathy 03/05/2013  . Unspecified vitamin D deficiency 03/05/2013  . Fatty infiltration of liver 02/08/2013  . Other and unspecified hyperlipidemia 02/08/2013  . Morbid obesity 02/08/2013  . Cellulitis of  groin, right 12/30/2012  . DJD (degenerative joint disease) 12/17/2012  . GERD (gastroesophageal reflux disease) 12/17/2012  . Peripheral neuropathy 12/17/2012  . HOCM (hypertrophic obstructive cardiomyopathy) 10/16/2012  . HTN (hypertension) 10/16/2012  . Sleep apnea 10/16/2012  . Heart palpitations    Current Outpatient Prescriptions on File Prior to Visit  Medication Sig Dispense Refill  . acetaminophen (TYLENOL) 500 MG tablet Take 1,000 mg by mouth every 6 (six) hours as needed for moderate pain.    . ciprofloxacin (CIPRO) 500 MG tablet Take 1 tablet (500 mg total) by mouth 2 (two) times daily. 6 tablet 0  . diltiazem (CARDIZEM CD) 240 MG 24 hr capsule Take 240 mg by mouth at bedtime.    . enoxaparin (LOVENOX) 40 MG/0.4ML injection Inject 0.4 mLs (40 mg total) into the skin daily. Continue Lovenox for seven more days and then start a 325 mg Aspirin daily for three more weeks. 7 Syringe 0  . furosemide (LASIX) 40 MG tablet TAKE 1 TABLET BY MOUTH DAILY (Patient taking differently: TAKE 1 TABLET BY MOUTH EVERY MORNING) 90 tablet 1  . HYDROmorphone (DILAUDID) 2 MG tablet Take 1-2 tablets (2-4 mg total) by mouth every 4 (four) hours as needed for severe pain. 30 tablet 0  . loteprednol (ALREX) 0.2 % SUSP Place 1 drop into the left eye daily as needed (irritation.).     Marland Kitchen methocarbamol (ROBAXIN) 500 MG tablet Take 1 tablet (500 mg total) by mouth every 6 (six) hours as needed for muscle spasms. (Patient not taking: Reported on 12/27/2014) 80 tablet 0  . metoprolol succinate (TOPROL-XL) 50 MG 24 hr tablet Takes  in am,  in pm.--Name Brand 45 tablet 6  . potassium chloride SA (K-DUR,KLOR-CON) 20 MEQ tablet TAKE 1 TABLET BY MOUTH EVERY DAY (Patient not taking: Reported on 12/27/2014) 90 tablet 1  . Propylene Glycol (SYSTANE BALANCE OP) Apply 2-3 drops to eye 3 (three) times daily as needed (dryness.).    Marland Kitchen ranitidine (ZANTAC) 150 MG tablet Take 150 mg by mouth 2 (two) times daily.     No  current facility-administered medications on file prior to visit.      Review of Systems See HPI    Objective:   Physical Exam Physical Exam  Nursing note and vitals reviewed.  Constitutional: She is oriented to person, place, and time. She appears well-developed and well-nourished.  HENT:  Head: Normocephalic and atraumatic.  Cardiovascular: Normal rate and regular rhythm. Exam reveals no gallop and no friction rub.  No murmur  heard.  Pulmonary/Chest: Breath sounds normal. She has no wheezes. She has no rales.  Neurological: She is alert and oriented to person, place, and time.  Skin: Skin is warm and dry.  Psychiatric: She has a normal mood and affect. Her behavior is normal.   Did not do external vaginal exam today      Assessment & Plan:  Probable yeast vaginitis:  Will give Diflucan 150 mg for 2 days and clotrimazole cream to be used externally   See me as needed

## 2015-02-07 DIAGNOSIS — G4733 Obstructive sleep apnea (adult) (pediatric): Secondary | ICD-10-CM | POA: Diagnosis not present

## 2015-02-10 DIAGNOSIS — Z96642 Presence of left artificial hip joint: Secondary | ICD-10-CM | POA: Diagnosis not present

## 2015-02-10 DIAGNOSIS — Z471 Aftercare following joint replacement surgery: Secondary | ICD-10-CM | POA: Diagnosis not present

## 2015-04-19 ENCOUNTER — Other Ambulatory Visit: Payer: Self-pay | Admitting: Internal Medicine

## 2015-04-19 NOTE — Telephone Encounter (Signed)
Refill req 

## 2015-05-03 ENCOUNTER — Ambulatory Visit (INDEPENDENT_AMBULATORY_CARE_PROVIDER_SITE_OTHER): Payer: Medicare Other | Admitting: Cardiology

## 2015-05-03 ENCOUNTER — Encounter: Payer: Self-pay | Admitting: Cardiology

## 2015-05-03 VITALS — BP 150/79 | HR 58 | Ht 68.0 in | Wt 287.0 lb

## 2015-05-03 DIAGNOSIS — I480 Paroxysmal atrial fibrillation: Secondary | ICD-10-CM

## 2015-05-03 DIAGNOSIS — E785 Hyperlipidemia, unspecified: Secondary | ICD-10-CM

## 2015-05-03 DIAGNOSIS — I10 Essential (primary) hypertension: Secondary | ICD-10-CM | POA: Diagnosis not present

## 2015-05-03 NOTE — Progress Notes (Signed)
HPI The patient presents as a new patient for me. She has seen by another group. She was found in the past to have atrial fibrillation which she ultimately thinks was related to a steroid injection. This was in 2008. At that time she was found to have asymmetric septal hypertrophy. This has been followed conservatively. As the echocardiogram did demonstrate concentric LV hypertrophy which was moderate and moderate left ear. I saw her previously I cleared her for hip surgery and she has since had both hips replaced. With this she had no cardiovascular problems. There was no recurrent atrial fibrillation. She's actually getting fewer palpitations and she was previously. She has some chronic dyspnea with exertion but this is not increased. She's not having any PND or orthopnea. She's not having any chest pressure, neck or arm discomfort. She's had a slow recovery from the second hip replacement and she is just going to increase her activity level now that her pool is open.    Allergies  Allergen Reactions  . Codeine Nausea Only  . Kenalog [Triamcinolone Acetonide]     Had A.fib after she received the injection."Steroid meds"  . Relafen [Nabumetone]     Edema   . Statins     Possible myositis   . Tramadol     Auditory halllucinations  . Penicillins Rash    Years ago, injection site was red and swollen.    Current Outpatient Prescriptions  Medication Sig Dispense Refill  . acetaminophen (TYLENOL) 500 MG tablet Take 1,000 mg by mouth every 6 (six) hours as needed for moderate pain.    . clotrimazole (GYNE-LOTRIMIN 3) 2 % vaginal cream Apply to external rash bid 21 g 0  . diltiazem (CARDIZEM CD) 240 MG 24 hr capsule Take 240 mg by mouth at bedtime.    . fluconazole (DIFLUCAN) 150 MG tablet Take one tablet daily for two days 2 tablet 0  . furosemide (LASIX) 40 MG tablet Take one daily with your potassium pill 90 tablet 1  . loteprednol (ALREX) 0.2 % SUSP Place 1 drop into the left eye daily as  needed (irritation.).     Marland Kitchen metoprolol succinate (TOPROL-XL) 50 MG 24 hr tablet Takes 50mg  in am, 25mg  in pm.--Name Brand 45 tablet 6  . potassium chloride SA (K-DUR,KLOR-CON) 20 MEQ tablet TAKE 1 TABLET BY MOUTH EVERY DAY 90 tablet 1  . Propylene Glycol (SYSTANE BALANCE OP) Apply 2-3 drops to eye 3 (three) times daily as needed (dryness.).    Marland Kitchen ranitidine (ZANTAC) 150 MG tablet Take 150 mg by mouth 2 (two) times daily.     No current facility-administered medications for this visit.    Past Medical History  Diagnosis Date  . Cataract     corrective surgery done  . Hypertension   . GERD (gastroesophageal reflux disease)   . Hyperlipidemia   . Sleep apnea     CPAP  . Hernia, hiatal   . Heart palpitations   . Arthritis     osteoarthritis-hips,knees, back, hands  . Hepatitis     past hx.,many yrs ago ? food source    Past Surgical History  Procedure Laterality Date  . Appendectomy  2004  . Cholecystectomy    . Retinal detachment surgery Bilateral     remains with slight hazy vision with lights  . Cataract extraction w/ intraocular lens implant      both eyes  . 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 Total hip arthroplasty Left 01/05/2015    Procedure: LEFT TOTAL HIP ARTHROPLASTY ANTERIOR APPROACH;  Surgeon: Loanne Drilling, MD;  Location: [facility];  Service: Orthopedics;  Laterality: Left;  . Joint replacement      ROS:  As stated in the HPI and negative for all other systems.  PHYSICAL EXAM BP 150/79 mmHg  Pulse 58  Ht  (1.727 m)  Wt 287 lb (130.182 kg)  BMI 43.65 kg/m2 GENERAL:  Well appearing HEENT:  Pupils equal round and reactive, fundi not visualized, oral mucosa unremarkable NECK:  No jugular venous distention, waveform within normal limits, carotid upstroke brisk and symmetric, no bruits, no  thyromegaly LYMPHATICS:  No cervical, inguinal adenopathy LUNGS:  Clear to auscultation bilaterally BACK:  No CVA tenderness CHEST:  Unremarkable HEART:  PMI not displaced or sustained,S1 and S2 within normal limits, no S3, no S4, no clicks, no rubs, 2/6 systolic murmur radiating out the aortic outflow tract and into the carotids not increasing with a strain phase of Valsalva, no diastolic murmurs ABD:  Flat, positive bowel sounds normal in frequency in pitch, no bruits, no rebound, no guarding, no midline pulsatile mass, no hepatomegaly, no splenomegaly, obese EXT:  2 plus pulses throughout, trace edema, no cyanosis no clubbing   EKG:  Sinus rhythm, rate 58, RAD, intervals within normal limits, poor anterior R wave progression, no acute ST-T wave changes.  05/03/2015  ASSESSMENT AND PLAN  Hypertrophic cardiomyopathy -  she may have some mild degree of this. However, I don't think this is hemodynamically significant or causing symptoms. I will follow this clinically.   AS - She does have some moderate aortic stenosis and I would not suspect that this was worse. No change in therapy or further echo is indicated at this time.   Dyslipidemia - She hasn't tolerated statins because of muscle aches but she wonders if this could've been really her orthopedic problems. Because she's going to start exercising more I do not wanting to cloud the picture with a statin as she might be getting more muscle pains just from her exercise. Rather after she has completed more rehabilitation in about 3 months I would like for her to be seen in the lipid clinic to restart a statin probably Crestor. If she ultimately is determined not to tolerate his statin she might be a candidate for  PCSK9.  Palpitations - She is rarely bothered by these. No change in therapy is indicated.   Hypertension  The blood pressure is elevated. However, she reports it's in the 120s over 60s at home. No change in therapy is  indicated.Marland Kitchen No change in medications is indicated. We will continue with therapeutic lifestyle changes (TLC).

## 2015-05-03 NOTE — Patient Instructions (Signed)
Your physician recommends that you schedule a follow-up appointment in: one year with Dr. Hochrein  

## 2015-05-11 ENCOUNTER — Encounter: Payer: Self-pay | Admitting: *Deleted

## 2015-05-25 ENCOUNTER — Telehealth: Payer: Self-pay | Admitting: *Deleted

## 2015-05-25 NOTE — Telephone Encounter (Signed)
Returned CPAP supply order to [facility]. 

## 2015-05-31 ENCOUNTER — Other Ambulatory Visit: Payer: Self-pay | Admitting: *Deleted

## 2015-05-31 MED ORDER — DILTIAZEM HCL ER COATED BEADS 240 MG PO CP24: 240.0000 mg | ORAL_CAPSULE | Freq: Every day | ORAL | Status: DC

## 2015-06-09 ENCOUNTER — Ambulatory Visit: Payer: Medicare Other | Admitting: Pharmacist Clinician (PhC)/ Clinical Pharmacy Specialist

## 2015-06-23 ENCOUNTER — Telehealth: Payer: Self-pay | Admitting: Pharmacist Clinician (PhC)/ Clinical Pharmacy Specialist

## 2015-06-23 ENCOUNTER — Ambulatory Visit: Payer: Medicare Other | Admitting: Pharmacist Clinician (PhC)/ Clinical Pharmacy Specialist

## 2015-06-23 DIAGNOSIS — E785 Hyperlipidemia, unspecified: Secondary | ICD-10-CM

## 2015-06-23 NOTE — Telephone Encounter (Signed)
Spoke with patient, has appt for lipid clinic today.  Last cholesterol labs were from 04/2014.  Made arrangements to move appt to 8/4 and will mail order for patient to get lipid/hepatic labs done before that date.  Pt voiced understanding.

## 2015-07-07 ENCOUNTER — Encounter: Payer: Self-pay | Admitting: Pharmacist Clinician (PhC)/ Clinical Pharmacy Specialist

## 2015-07-07 ENCOUNTER — Ambulatory Visit (INDEPENDENT_AMBULATORY_CARE_PROVIDER_SITE_OTHER): Payer: Medicare Other | Admitting: Pharmacist Clinician (PhC)/ Clinical Pharmacy Specialist

## 2015-07-07 VITALS — Ht 68.0 in | Wt 279.5 lb

## 2015-07-07 DIAGNOSIS — E782 Mixed hyperlipidemia: Secondary | ICD-10-CM

## 2015-07-07 DIAGNOSIS — E785 Hyperlipidemia, unspecified: Secondary | ICD-10-CM

## 2015-07-07 MED ORDER — ROSUVASTATIN CALCIUM 5 MG PO TABS
ORAL_TABLET | ORAL | Status: DC
Start: 2015-07-07 — End: 2015-08-21

## 2015-07-07 NOTE — Patient Instructions (Addendum)
Try to increase oatmeal, metamucil and almonds in your diet.  Start Crestor 5 mg once weekly.  If you do well with this, try increasing to twice weekly after 6 weeks  Repeat labs in mid-October and see me October 27 at 3:30

## 2015-07-07 NOTE — Progress Notes (Signed)
07/07/2015 Victoria Delgado [birth year] [id number]   HPI:  Victoria Delgado is a 74 y.o. female patient of Dr Antoine Poche, who presents today for a lipid clinic evaluation.   She has had hip problems for several years, and spent much of the past 2 years with poor mobility, spending very little time on her feet.  She had hip replacements in September 2015 and February 2016, and since then has felt much better, with less leg pain.  She has been working on increasing her mobility, and currently walks in a pool 45-60 minutes 3 times each week.  Her hope is to do more walking on land as well as in the pool.    RF:  HTN; she is primary prevention, LDL goal <100  Meds: none   Tried in the past: has tired both Lipitor 20 and Crestor 5 daily - both of these caused myalgias in her legs, although now she believes that some of that may have been due to the need for hip replacement.  Zetia caused fluid retention, Niacin caused flushing and fenofibrate caused GI upset and constipation   Family history: mother died from stroke at age 10, had high cholesterol and hypertension.  Diet: follows low sodium diet - retains fluid easily; eats eggs for breakfast most days, some breakfast meats.  Dinners usually meat and vegetables, avoids starches.  Exercise: pool walking for 45-60 min 3x/week    Labs:  06/2015 - TC 211, TG 245, HDL 30, LDL 132 04/2014 - TC 262, TG 227, HDL 34, LDL 181   Current Outpatient Prescriptions  Medication Sig Dispense Refill  . diltiazem (CARDIZEM CD) 240 MG 24 hr capsule Take 1 capsule (240 mg total) by mouth at bedtime. 30 capsule 5  . furosemide (LASIX) 40 MG tablet Take one daily with your potassium pill 90 tablet 1  . loteprednol (ALREX) 0.2 % SUSP Place 1 drop into the left eye daily as needed (irritation.).     Marland Kitchen metoprolol succinate (TOPROL-XL) 50 MG 24 hr tablet Takes 50mg  in am, 25mg  in pm.--Name Brand 45 tablet 6  . potassium chloride SA (K-DUR,KLOR-CON) 20 MEQ tablet TAKE 1 TABLET BY  MOUTH EVERY DAY 90 tablet 1  . Propylene Glycol (SYSTANE BALANCE OP) Apply 2-3 drops to eye 3 (three) times daily as needed (dryness.).    Marland Kitchen ranitidine (ZANTAC) 150 MG tablet Take 150 mg by mouth 2 (two) times daily.    . rosuvastatin (CRESTOR) 5 MG tablet Take 1 tablet by mouth every other day or as directed 15 tablet 1   No current facility-administered medications for this visit.    Allergies  Allergen Reactions  . Codeine Nausea Only  . Kenalog [Triamcinolone Acetonide]     Had A.fib after she received the injection."Steroid meds"  . Relafen [Nabumetone]     Edema   . Statins     Possible myositis   . Tramadol     Auditory halllucinations  . Penicillins Rash    Years ago, injection site was red and swollen.    Past Medical History  Diagnosis Date  . Cataract     corrective surgery done  . Hypertension   . GERD (gastroesophageal reflux disease)   . Hyperlipidemia   . Sleep apnea     CPAP  . Hernia, hiatal   . Heart palpitations   . Arthritis     osteoarthritis-hips,knees, back, hands  . Hepatitis     past hx.,many yrs ago ? food source  . Asymmetric  septal hypertrophy     Height  (1.727 m), weight 279 lb 8 oz (126.78 kg).   Phillips Hay PharmD CPP [facility]

## 2015-07-07 NOTE — Assessment & Plan Note (Signed)
Pt had labs drawn at [facility] on July 25, we did not receive copy of them until after her visit today.  Will call patient tomorrow and go over results.  In office we reviewed options for decreasing LDL, which 1 year ago was 810.  She is not a candidate for PCSK-9 inhibitors at this time because she is primary prevention.  She is willing to try once weekly Crestor, as she believes some of her previous pains may have been due to her bad hips.  We will start her on once weekly Crestor 5 mg, and she can increase to twice weekly after 4-6 weeks if she has no problems with that.  We talked about diet and she is going to incorporate more oatmeal, fiber and almonds into her diet. Will repeat labs in mid-October and see her a week or two later.

## 2015-07-08 ENCOUNTER — Telehealth: Payer: Self-pay | Admitting: Pharmacist Clinician (PhC)/ Clinical Pharmacy Specialist

## 2015-07-08 NOTE — Telephone Encounter (Signed)
Spoke with patient, reviewed cholesterol labs.  LDL not as bad as previous, TG worse.  Pt to continue with plan to change dietary habits and continue exercise.  Will repeat labs in October.  Pt agreeable to plan

## 2015-07-14 ENCOUNTER — Other Ambulatory Visit: Payer: Self-pay | Admitting: Cardiology

## 2015-07-14 NOTE — Telephone Encounter (Signed)
Rx(s) sent to pharmacy electronically.  

## 2015-08-21 ENCOUNTER — Other Ambulatory Visit: Payer: Self-pay | Admitting: Cardiology

## 2015-08-23 NOTE — Telephone Encounter (Signed)
Should this be written for twice weekly? Please advise. Thanks, MI

## 2015-09-13 LAB — HEPATIC FUNCTION PANEL
ALBUMIN: 4.2 g/dL (ref 3.6–5.1)
ALT: 15 U/L (ref 6–29)
AST: 16 U/L (ref 10–35)
Alkaline Phosphatase: 96 U/L (ref 33–130)
BILIRUBIN INDIRECT: 0.3 mg/dL (ref 0.2–1.2)
Bilirubin, Direct: 0.1 mg/dL (ref <=0.2)
TOTAL PROTEIN: 6.7 g/dL (ref 6.1–8.1)
Total Bilirubin: 0.4 mg/dL (ref 0.2–1.2)

## 2015-09-13 LAB — LIPID PANEL
CHOL/HDL RATIO: 6.9 ratio — AB (ref <=5.0)
Cholesterol: 199 mg/dL (ref 125–200)
HDL: 29 mg/dL — ABNORMAL LOW (ref >=46)
LDL CALC: 105 mg/dL (ref <130)
TRIGLYCERIDES: 323 mg/dL — AB (ref <150)
VLDL: 65 mg/dL — AB (ref <30)

## 2015-09-29 ENCOUNTER — Encounter: Payer: Self-pay | Admitting: Pharmacist Clinician (PhC)/ Clinical Pharmacy Specialist

## 2015-09-29 ENCOUNTER — Ambulatory Visit (INDEPENDENT_AMBULATORY_CARE_PROVIDER_SITE_OTHER): Payer: Medicare Other | Admitting: Pharmacist Clinician (PhC)/ Clinical Pharmacy Specialist

## 2015-09-29 DIAGNOSIS — E782 Mixed hyperlipidemia: Secondary | ICD-10-CM

## 2015-09-29 NOTE — Assessment & Plan Note (Addendum)
While the Crestor dropped her LDL significantly, she has been unable to tolerate the medication any longer.  Unfortunately, her triglycerides went up significantly from her last labs as well.  She has not been able to tolerate the medications we have tried. She will not qualify for PCSK-9, as she has no ASCVD or familial hyperlipidemia.  For now she would like to work on diet and exercise, as she is primary prevention.  We had a long discussion about foods that are beneficial, including oatmeal (which she has taken to eating regularly), almonds and high fiber foods.  We suggested a fiber supplement to help with this and she will do what she can to stay active and healthy.  Will have her repeat her labs in 6 months and see where she is at that time.

## 2015-09-29 NOTE — Progress Notes (Signed)
09/29/2015 Victoria Delgado 1941/08/19 [id number]   HPI:  Victoria Delgado is a 74 y.o. female patient of Dr Antoine Poche, who presents today for a lipid clinic follow up.   She has had hip problems for several years, leading to hip replacements in September 2015 and February 2016.  Since then she has felt much better, with less leg pain.  At her last visit in August we restarted Crestor 5 mg once weekly.  She took this until about 2 weeks ago, when the leg pain got to be too much.  Her LDL dropped from 181 to 105 on that low dose of Crestor, but she could tolerate it no longer.      RF:  HTN; she is primary prevention, LDL goal <100  Meds: none   Tried in the past: has tired both Lipitor 20 and Crestor 5 daily and once weekly,  Zetia caused fluid retention, Niacin caused flushing and fenofibrate caused GI upset and constipation   Family history: mother died from stroke at age 19, had high cholesterol and hypertension.  Diet: follows low sodium diet - retains fluid easily; eats eggs for breakfast most days, some breakfast meats.  Dinners usually meat and vegetables, avoids starches.  Exercise: pool walking for 45-60 min 3x/week    Labs:  09/2015 - TC 199, TG 323, HDL 29, LDL 105 (Crestor 5 mg weely) 06/2015 - TC 211, TG 245, HDL 30, LDL 132 04/2014 - TC 262, TG 227, HDL 34, LDL 181   Current Outpatient Prescriptions  Medication Sig Dispense Refill  . CRESTOR 5 MG tablet TAKE 1 TABLET BY MOUTH EVERY OTHER DAY OR AS DIRECTED 15 tablet 3  . diltiazem (CARDIZEM CD) 240 MG 24 hr capsule Take 1 capsule (240 mg total) by mouth at bedtime. 30 capsule 5  . furosemide (LASIX) 40 MG tablet Take one daily with your potassium pill 90 tablet 1  . loteprednol (ALREX) 0.2 % SUSP Place 1 drop into the left eye daily as needed (irritation.).     Marland Kitchen potassium chloride SA (K-DUR,KLOR-CON) 20 MEQ tablet TAKE 1 TABLET BY MOUTH EVERY DAY 90 tablet 1  . Propylene Glycol (SYSTANE BALANCE OP) Apply 2-3 drops to eye 3  (three) times daily as needed (dryness.).    Marland Kitchen ranitidine (ZANTAC) 150 MG tablet Take 150 mg by mouth 2 (two) times daily.    . TOPROL XL 50 MG 24 hr tablet TAKE 1 TABLET BY MOUTH EVERY MORNING AND 1/2 TABLET EVERY EVENING 45 tablet 10   No current facility-administered medications for this visit.    Allergies  Allergen Reactions  . Codeine Nausea Only  . Kenalog [Triamcinolone Acetonide]     Had A.fib after she received the injection."Steroid meds"  . Relafen [Nabumetone]     Edema   . Statins     Possible myositis   . Tramadol     Auditory halllucinations  . Penicillins Rash    Years ago, injection site was red and swollen.    Past Medical History  Diagnosis Date  . Cataract     corrective surgery done  . Hypertension   . GERD (gastroesophageal reflux disease)   . Hyperlipidemia   . Sleep apnea     CPAP  . Hernia, hiatal   . Heart palpitations   . Arthritis     osteoarthritis-hips,knees, back, hands  . Hepatitis     past hx.,many yrs ago ? food source  . Asymmetric septal hypertrophy (HCC)  There were no vitals taken for this visit.   Phillips Hay PharmD CPP [facility]

## 2015-09-29 NOTE — Patient Instructions (Signed)
Repeat labs in 6 months (March 2017)   Your most recent cholesterol results are as follows: Sep 12, 2015 Total Cholesterol  199  Triglycerides  323  HDL 29  LDL  105   Your goal LDL level is 105. LDL is the bad cholesterol and we use medications to lower this number based on your specific cardiovascular risk. In patients who are higher risk, we try to keep this number below 70.  . Your risk factors include: none at this time,  You are being treated for "primary prevention" - we are trying to prevent any cardiovascular events from happening.  It is also recommended that patients with high cholesterol adhere to a heart healthy diet, get regular exercise, avoid use of tobacco products, and maintain a healthy weight. Steps that you can take to help in these areas:  . Limit consumption of trans fats, saturated fats, and cholesterol in your diet  . Increase intake of lean meats such as chicken, [place], and fish  . Increase intake of foods rich in fiber such as fresh fruits, vegetables, beans and oatmeal . Exercise as you are able; even 30 minutes of walking daily can aid in increasing heart health

## 2015-10-21 ENCOUNTER — Other Ambulatory Visit: Payer: Self-pay | Admitting: *Deleted

## 2015-10-21 MED ORDER — FUROSEMIDE 40 MG PO TABS: ORAL_TABLET | ORAL | Status: DC

## 2015-10-25 ENCOUNTER — Other Ambulatory Visit: Payer: Self-pay

## 2015-10-25 MED ORDER — POTASSIUM CHLORIDE CRYS ER 20 MEQ PO TBCR: 20.0000 meq | EXTENDED_RELEASE_TABLET | Freq: Every day | ORAL | Status: DC

## 2015-11-15 ENCOUNTER — Telehealth: Payer: Self-pay | Admitting: Cardiology

## 2015-11-15 NOTE — Telephone Encounter (Signed)
Pt dropped ff a prior authorization form on 10-18-15. She wants to know if it have been completed?

## 2015-11-15 NOTE — Telephone Encounter (Signed)
Returned call to patient.She stated she needs a new prior authorization for Toprol.Stated it will expire 12/03/15.Stated to call [employer].Advised I will send message to [facility] LPN who does our PA's.Patient request will call her and let her know when done.

## 2015-11-21 ENCOUNTER — Other Ambulatory Visit: Payer: Self-pay | Admitting: Cardiology

## 2015-11-29 ENCOUNTER — Telehealth: Payer: Self-pay | Admitting: Cardiology

## 2015-11-29 NOTE — Telephone Encounter (Signed)
Patient would like to know where in the process her prior authorization is for her  From Victoria Delgado's note on 12/13 see that the request for prior authorization was sent to [employer]. Patient has not heard anything back I called Victoria Delgado who told me she hasn't got to it yet but states she will get to it before 12/31  Patient request to have contact information of office manager; Given Victoria Delgado's contact information  Called patient back and told her that it was sent to Victoria Delgado on 12/13 by Victoria Delgado and Victoria Delgado said that she will get it done by the end of the week Patient said she has brought the form to the office in November and given to the front desk No forms from this patient found in Dr. Lindaann Delgado pre-auth folder.

## 2015-11-29 NOTE — Telephone Encounter (Signed)
Please call,concerning medical acceptance form.

## 2015-11-30 ENCOUNTER — Telehealth: Payer: Self-pay

## 2015-11-30 NOTE — Telephone Encounter (Signed)
Prior auth for brand name Toprol XL 50mg  sent to [facility].

## 2015-11-30 NOTE — Telephone Encounter (Signed)
Email received from patient requesting follow up on her insurance request for Toprol XL (brand name) prior authorization.  Writer contacted pharmacy who reports current authorization expires 12/03/15.  Call to [facility] received PA #[id number] good for 2017.  Attempted to contact pt x2 to provide an update, message left requesting return call. Jim Like MHA RN CCM

## 2015-12-01 ENCOUNTER — Telehealth: Payer: Self-pay

## 2015-12-01 NOTE — Telephone Encounter (Signed)
Approval for brand name Toprol XL 50mg , good through 12/02/2016. PA # [id number].

## 2015-12-23 ENCOUNTER — Other Ambulatory Visit: Payer: Self-pay | Admitting: Pharmacist Clinician (PhC)/ Clinical Pharmacy Specialist

## 2015-12-26 ENCOUNTER — Encounter: Payer: Self-pay | Admitting: Cardiology

## 2016-01-03 ENCOUNTER — Telehealth: Payer: Self-pay | Admitting: Pharmacist Clinician (PhC)/ Clinical Pharmacy Specialist

## 2016-01-11 ENCOUNTER — Other Ambulatory Visit: Payer: Self-pay

## 2016-01-11 DIAGNOSIS — Z1231 Encounter for screening mammogram for malignant neoplasm of breast: Secondary | ICD-10-CM

## 2016-01-24 ENCOUNTER — Ambulatory Visit: Payer: Self-pay | Admitting: Pharmacist Clinician (PhC)/ Clinical Pharmacy Specialist

## 2016-01-24 NOTE — Telephone Encounter (Signed)
Close encounter 

## 2016-02-24 ENCOUNTER — Emergency Department (HOSPITAL_COMMUNITY): Payer: Medicare Other

## 2016-02-24 ENCOUNTER — Encounter (HOSPITAL_COMMUNITY): Payer: Self-pay

## 2016-02-24 ENCOUNTER — Ambulatory Visit: Payer: Self-pay

## 2016-02-24 ENCOUNTER — Emergency Department (HOSPITAL_COMMUNITY)
Admission: EM | Admit: 2016-02-24 | Discharge: 2016-02-24 | Disposition: A | Discharge status: Home / Self Care | Payer: Medicare Other | Attending: Emergency Medicine | Admitting: Emergency Medicine

## 2016-02-24 DIAGNOSIS — Z79899 Other long term (current) drug therapy: Secondary | ICD-10-CM | POA: Diagnosis not present

## 2016-02-24 DIAGNOSIS — K219 Gastro-esophageal reflux disease without esophagitis: Secondary | ICD-10-CM | POA: Diagnosis not present

## 2016-02-24 DIAGNOSIS — Z8639 Personal history of other endocrine, nutritional and metabolic disease: Secondary | ICD-10-CM | POA: Insufficient documentation

## 2016-02-24 DIAGNOSIS — Z880 Allergy status to penicillin: Secondary | ICD-10-CM | POA: Diagnosis not present

## 2016-02-24 DIAGNOSIS — J3501 Chronic tonsillitis: Secondary | ICD-10-CM | POA: Diagnosis not present

## 2016-02-24 DIAGNOSIS — G4730 Sleep apnea, unspecified: Secondary | ICD-10-CM | POA: Diagnosis not present

## 2016-02-24 DIAGNOSIS — Z87891 Personal history of nicotine dependence: Secondary | ICD-10-CM | POA: Insufficient documentation

## 2016-02-24 DIAGNOSIS — J029 Acute pharyngitis, unspecified: Secondary | ICD-10-CM | POA: Diagnosis present

## 2016-02-24 DIAGNOSIS — Z7982 Long term (current) use of aspirin: Secondary | ICD-10-CM | POA: Diagnosis not present

## 2016-02-24 DIAGNOSIS — I10 Essential (primary) hypertension: Secondary | ICD-10-CM | POA: Insufficient documentation

## 2016-02-24 DIAGNOSIS — M158 Other polyosteoarthritis: Secondary | ICD-10-CM | POA: Insufficient documentation

## 2016-02-24 DIAGNOSIS — Z9981 Dependence on supplemental oxygen: Secondary | ICD-10-CM | POA: Diagnosis not present

## 2016-02-24 LAB — CBC WITH DIFFERENTIAL/PLATELET
BASOS ABS: 0 10*3/uL (ref 0.0–0.1)
Basophils Relative: 0 %
EOS PCT: 0 %
Eosinophils Absolute: 0 10*3/uL (ref 0.0–0.7)
HEMATOCRIT: 43.1 % (ref 36.0–46.0)
HEMOGLOBIN: 14.6 g/dL (ref 12.0–15.0)
LYMPHS PCT: 29 %
Lymphs Abs: 2.7 10*3/uL (ref 0.7–4.0)
MCH: 30.4 pg (ref 26.0–34.0)
MCHC: 33.9 g/dL (ref 30.0–36.0)
MCV: 89.6 fL (ref 78.0–100.0)
MONOS PCT: 13 %
Monocytes Absolute: 1.2 10*3/uL — ABNORMAL HIGH (ref 0.1–1.0)
NEUTROS ABS: 5.4 10*3/uL (ref 1.7–7.7)
Neutrophils Relative %: 58 %
Platelets: 241 10*3/uL (ref 150–400)
RBC: 4.81 MIL/uL (ref 3.87–5.11)
RDW: 13.4 % (ref 11.5–15.5)
WBC: 9.3 10*3/uL (ref 4.0–10.5)

## 2016-02-24 LAB — I-STAT CHEM 8, ED
BUN: 18 mg/dL (ref 6–20)
CHLORIDE: 103 mmol/L (ref 101–111)
Calcium, Ion: 1.03 mmol/L — ABNORMAL LOW (ref 1.13–1.30)
Creatinine, Ser: 1 mg/dL (ref 0.44–1.00)
Glucose, Bld: 139 mg/dL — ABNORMAL HIGH (ref 65–99)
HEMATOCRIT: 44 % (ref 36.0–46.0)
Hemoglobin: 15 g/dL (ref 12.0–15.0)
Potassium: 4.5 mmol/L (ref 3.5–5.1)
Sodium: 140 mmol/L (ref 135–145)
TCO2: 24 mmol/L (ref 0–100)

## 2016-02-24 LAB — RAPID STREP SCREEN (MED CTR MEBANE ONLY): Streptococcus, Group A Screen (Direct): NEGATIVE

## 2016-02-24 MED ORDER — PANTOPRAZOLE SODIUM 20 MG PO TBEC: 20.0000 mg | DELAYED_RELEASE_TABLET | Freq: Every day | ORAL | Status: DC

## 2016-02-24 MED ORDER — CLINDAMYCIN HCL 300 MG PO CAPS: 300.0000 mg | ORAL_CAPSULE | Freq: Three times a day (TID) | ORAL | Status: DC

## 2016-02-24 MED ORDER — IOPAMIDOL (ISOVUE-300) INJECTION 61%
75.0000 mL | Freq: Once | INTRAVENOUS | Status: AC | PRN
  Administered 2016-02-24: 75 mL via INTRAVENOUS

## 2016-02-24 NOTE — Discharge Instructions (Signed)
Take clindamycin as prescribed until all gone. protonix as prescribed for acid reflux. Follow up with ENT for recheck and further treatment.    Tonsillitis Tonsillitis is an infection of the throat that causes the tonsils to become red, tender, and swollen. Tonsils are collections of lymphoid tissue at the back of the throat. Each tonsil has crevices (crypts). Tonsils help fight nose and throat infections and keep infection from spreading to other parts of the body for the first 18 months of life.  CAUSES Sudden (acute) tonsillitis is usually caused by infection with streptococcal bacteria. Long-lasting (chronic) tonsillitis occurs when the crypts of the tonsils become filled with pieces of food and bacteria, which makes it easy for the tonsils to become repeatedly infected. SYMPTOMS  Symptoms of tonsillitis include:  A sore throat, with possible difficulty swallowing.  White patches on the tonsils.  Fever.  Tiredness.  New episodes of snoring during sleep, when you did not snore before.  Small, foul-smelling, yellowish-white pieces of material (tonsilloliths) that you occasionally cough up or spit out. The tonsilloliths can also cause you to have bad breath. DIAGNOSIS Tonsillitis can be diagnosed through a physical exam. Diagnosis can be confirmed with the results of lab tests, including a throat culture. TREATMENT  The goals of tonsillitis treatment include the reduction of the severity and duration of symptoms and prevention of associated conditions. Symptoms of tonsillitis can be improved with the use of steroids to reduce the swelling. Tonsillitis caused by bacteria can be treated with antibiotic medicines. Usually, treatment with antibiotic medicines is started before the cause of the tonsillitis is known. However, if it is determined that the cause is not bacterial, antibiotic medicines will not treat the tonsillitis. If attacks of tonsillitis are severe and frequent, your health care  provider may recommend surgery to remove the tonsils (tonsillectomy). HOME CARE INSTRUCTIONS   Rest as much as possible and get plenty of sleep.  Drink plenty of fluids. While the throat is very sore, eat soft foods or liquids, such as sherbet, soups, or instant breakfast drinks.  Eat frozen ice pops.  Gargle with a warm or cold liquid to help soothe the throat. Mix 1/4 teaspoon of salt and 1/4 teaspoon of baking soda in 8 oz of water. SEEK MEDICAL CARE IF:   Large, tender lumps develop in your neck.  A rash develops.  A green, yellow-brown, or bloody substance is coughed up.  You are unable to swallow liquids or food for 24 hours.  You notice that only one of the tonsils is swollen. SEEK IMMEDIATE MEDICAL CARE IF:   You develop any new symptoms such as vomiting, severe headache, stiff neck, chest pain, or trouble breathing or swallowing.  You have severe throat pain along with drooling or voice changes.  You have severe pain, unrelieved with recommended medications.  You are unable to fully open the mouth.  You develop redness, swelling, or severe pain anywhere in the neck.  You have a fever. MAKE SURE YOU:   Understand these instructions.  Will watch your condition.  Will get help right away if you are not doing well or get worse.   This information is not intended to replace advice given to you by your health care provider. Make sure you discuss any questions you have with your health care provider.   Document Released: 08/29/2005 Document Revised: 12/10/2014 Document Reviewed: 05/08/2013 Elsevier Interactive Patient Education [employer]

## 2016-02-24 NOTE — ED Notes (Signed)
Pt complains of throat pain mainly on the right side, she states that she was taking bactrim for a sinus infection and nystatin for a sore throat that was given to her a few weeks ago, tonight the throat pain woke her up

## 2016-02-24 NOTE — ED Provider Notes (Signed)
CSN: [id number]     Arrival date & time 02/24/16  0303 History   First MD Initiated Contact with Patient 02/24/16 [phone]     Chief Complaint  Patient presents with  . Sore Throat     (Consider location/radiation/quality/duration/timing/severity/associated sxs/prior Treatment) HPI Victoria Delgado is a 75 y.o. female with history of acid reflux, hiatal hernia, presents to emergency department complaining of right-sided throat discomfort for approximately 3 months. Patient states her symptoms started with nasal congestion and sore throat after receiving her flu shot 3 months ago. She reports at that time going to an urgent care where "they did not even examine me he gave me nystatin." Patient states that did not help her throat. She says then followed up with her primary care doctor twice and has completed a course of Bactrim for "sinus infection and sore throat" which did not help her throat either. She states pain is only on the right side. She feels that when she swallows. She denies any pain with palpation of the throat. She states she has some pain with movement of the neck. She denies any difficulty swallowing. She denies any difficulty breathing. Pt states this pain woke her up at 2am this morning and states it is worse than it has ever been so decided to come in.   Past Medical History  Diagnosis Date  . Cataract     corrective surgery done  . Hypertension   . GERD (gastroesophageal reflux disease)   . Hyperlipidemia   . Sleep apnea     CPAP  . Hernia, hiatal   . Heart palpitations   . Arthritis     osteoarthritis-hips,knees, back, hands  . Hepatitis     past hx.,many yrs ago ? food source  . Asymmetric septal hypertrophy [facility])    Past Surgical History  Procedure Laterality Date  . Appendectomy  2004  . Cholecystectomy    . Retinal detachment surgery Bilateral     remains with slight hazy vision with lights  . Cataract extraction w/ intraocular lens implant      both eyes  .  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 Total hip arthroplasty Left 01/05/2015    Procedure: LEFT TOTAL HIP ARTHROPLASTY ANTERIOR APPROACH;  Surgeon: Loanne Drilling, MD;  Location: [facility];  Service: Orthopedics;  Laterality: Left;  . Joint replacement    . US echocardiography  06/25/2012    Moderate ASH,LV hyperdynamic,LA is mod. dilated,trace MR,TR,AI  . Nm myocar perf wall motion  08/15/04    No ischemia   Family History  Problem Relation Age of Onset  . Cancer Father 65  . Hypertension Mother   . CVA Mother    Social History  Substance Use Topics  . Smoking status: Former Games developer  . Smokeless tobacco: Never Used     Comment: QUIT IN 30  . Alcohol Use: No   OB History    Gravida Para Term Preterm AB TAB SAB Ectopic Multiple Living   2 2             Review of Systems  Constitutional: Negative for fever and chills.  Respiratory: Negative for cough, chest tightness and shortness of breath.   Cardiovascular: Negative for chest pain, palpitations and leg swelling.  Gastrointestinal: Negative for nausea, vomiting, abdominal pain and diarrhea.  Genitourinary: Negative for dysuria,  flank pain, vaginal bleeding, vaginal discharge, vaginal pain and pelvic pain.  Musculoskeletal: Negative for myalgias, arthralgias, neck pain and neck stiffness.  Skin: Negative for rash.  Neurological: Negative for dizziness, weakness and headaches.  All other systems reviewed and are negative.     Allergies  Codeine; Kenalog; Relafen; Statins; Tramadol; and Penicillins  Home Medications   Prior to Admission medications   Medication Sig Start Date End Date Taking? Authorizing Provider  aspirin EC 81 MG tablet Take 81 mg by mouth daily.   Yes Historical Provider, MD  CARTIA XT 240 MG 24 hr capsule TAKE 1 CAPSULE(240 MG) BY MOUTH AT BEDTIME  11/22/15  Yes Rollene Rotunda, MD  furosemide (LASIX) 40 MG tablet Take one daily with your potassium pill 10/21/15  Yes Rollene Rotunda, MD  potassium chloride SA (K-DUR,KLOR-CON) 20 MEQ tablet Take 1 tablet (20 mEq total) by mouth daily. Patient taking differently: Take 10 mEq by mouth daily.  10/25/15  Yes Rollene Rotunda, MD  ranitidine (ZANTAC) 150 MG tablet Take 150 mg by mouth 2 (two) times daily.   Yes Historical Provider, MD  TOPROL XL 50 MG 24 hr tablet TAKE 1 TABLET BY MOUTH EVERY MORNING AND 1/2 TABLET EVERY EVENING 07/14/15  Yes Rollene Rotunda, MD  CRESTOR 5 MG tablet TAKE 1 TABLET BY MOUTH EVERY OTHER DAY OR AS DIRECTED Patient not taking: Reported on 02/24/2016 08/23/15   Rollene Rotunda, MD   BP 163/69 mmHg  Pulse 79  Temp(Src) 98.2 F (36.8 C) (Oral)  Resp 18  Ht 5\' 8"  (1.727 m)  Wt 122.471 kg  BMI 41.06 kg/m2  SpO2 95% Physical Exam  Constitutional: She is oriented to person, place, and time. She appears well-developed and well-nourished. No distress.  HENT:  Head: Normocephalic.  Normal oropharynx  Eyes: Conjunctivae are normal.  Neck: Neck supple.  No neck tenderness bilaterally. No bruits. No pulsatile masses.  Cardiovascular: Normal rate, regular rhythm and normal heart sounds.   Pulmonary/Chest: Effort normal and breath sounds normal. No respiratory distress. She has no wheezes. She has no rales.  Abdominal: Soft. Bowel sounds are normal. She exhibits no distension. There is no tenderness. There is no rebound.  Musculoskeletal: She exhibits no edema.  Lymphadenopathy:    She has no cervical adenopathy.  Neurological: She is alert and oriented to person, place, and time.  Skin: Skin is warm and dry.  Psychiatric: She has a normal mood and affect. Her behavior is normal.  Nursing note and vitals reviewed.   ED Course  Procedures (including critical care time) Labs Review Labs Reviewed  RAPID STREP SCREEN (NOT AT [facility])  CULTURE, GROUP A STREP ([facility])  CBC WITH  DIFFERENTIAL/PLATELET  I-STAT CHEM 8, ED    Imaging Review No results found. I have personally reviewed and evaluated these images and lab results as part of my medical decision-making.   EKG Interpretation None      MDM   Final diagnoses:  Chronic tonsillitis   Pt with right sided neck pain that is worse with swallowing. Symptoms for 3 months. Worsening. Will get CT neck to ro infection or masses.  Pt is in no acute distress. Protecting airway. Does not appear to have trouble swallowing.   7:58 AM CT consistent with chronic tonsillitis. Will start on antibiotic. Pt is penicillin allergic. Will start on clindamycin. Follow up with ENT. Also will place on protonix, pt has hx of acid reflux that she believes is getting worse.   Filed Vitals:   02/24/16  0308  BP: 163/69  Pulse: 79  Temp: 98.2 F (36.8 C)  TempSrc: Oral  Resp: 18  Height: 5\' 8"  (1.727 m)  Weight: 122.471 kg  SpO2: 95%       Jaynie Crumble, PA-C 02/24/16 0802  April Palumbo, MD 03/08/16 2300

## 2016-02-26 LAB — CULTURE, GROUP A STREP (THRC)

## 2016-02-29 ENCOUNTER — Encounter: Payer: Self-pay | Admitting: Internal Medicine

## 2016-03-29 ENCOUNTER — Other Ambulatory Visit: Payer: Self-pay

## 2016-04-17 ENCOUNTER — Ambulatory Visit
Admission: RE | Admit: 2016-04-17 | Discharge: 2016-04-17 | Disposition: A | Discharge status: Home / Self Care | Payer: Medicare Other | Source: Ambulatory Visit

## 2016-04-17 DIAGNOSIS — Z1231 Encounter for screening mammogram for malignant neoplasm of breast: Secondary | ICD-10-CM

## 2016-04-18 ENCOUNTER — Other Ambulatory Visit: Payer: Self-pay | Admitting: Internal Medicine

## 2016-04-18 DIAGNOSIS — R928 Other abnormal and inconclusive findings on diagnostic imaging of breast: Secondary | ICD-10-CM

## 2016-04-23 ENCOUNTER — Ambulatory Visit
Admission: RE | Admit: 2016-04-23 | Discharge: 2016-04-23 | Disposition: A | Discharge status: Home / Self Care | Payer: Medicare Other | Source: Ambulatory Visit | Attending: Internal Medicine | Admitting: Internal Medicine

## 2016-04-23 ENCOUNTER — Ambulatory Visit (INDEPENDENT_AMBULATORY_CARE_PROVIDER_SITE_OTHER): Payer: Medicare Other | Admitting: Internal Medicine

## 2016-04-23 ENCOUNTER — Encounter: Payer: Self-pay | Admitting: Internal Medicine

## 2016-04-23 VITALS — BP 142/72 | HR 64 | Ht 68.0 in | Wt 294.0 lb

## 2016-04-23 DIAGNOSIS — Z1211 Encounter for screening for malignant neoplasm of colon: Secondary | ICD-10-CM | POA: Diagnosis not present

## 2016-04-23 DIAGNOSIS — R928 Other abnormal and inconclusive findings on diagnostic imaging of breast: Secondary | ICD-10-CM

## 2016-04-23 DIAGNOSIS — K219 Gastro-esophageal reflux disease without esophagitis: Secondary | ICD-10-CM

## 2016-04-23 DIAGNOSIS — M542 Cervicalgia: Secondary | ICD-10-CM | POA: Diagnosis not present

## 2016-04-23 NOTE — Patient Instructions (Signed)
You will be contacted by [employer] to initiate the process for your Cologuard

## 2016-04-23 NOTE — Progress Notes (Signed)
HISTORY OF PRESENT ILLNESS:  Victoria Delgado is a 75 y.o. female with past medical history as listed below who is referred by the emergency room physician assistant regarding problems with right neck and throat pain possibly related to GERD. I have reviewed her emergency room visit from March 2017. At that time she was having discomfort in her right neck region. She was concerned this may represent a coronary condition. She was worked up including CT scan of the neck. She was diagnosed with chronic tonsillitis and treated with clindamycin. Her symptoms resolved. She saw ENT is recommended. No further evaluation or treatment plan. She continues to be asymptomatic. She does have a history of GERD for which she takes Zantac 150 mg twice a day. For the most part, excellent control symptoms. Occasionally needs Rolaids for breakthrough. She has been seen previously by Dr. Danise Edge of [facility]. Last colonoscopy 2003 was normal. Except for bloating, GI review of systems is otherwise negative.  REVIEW OF SYSTEMS:  All non-GI ROS negative except for sinus and allergy, arthritis, back pain, breast change, heart murmur, sore throat (resolved)  Past Medical History  Diagnosis Date  . Cataract     corrective surgery done  . Hypertension   . GERD (gastroesophageal reflux disease)   . Hyperlipidemia   . Sleep apnea     CPAP  . Hernia, hiatal   . Heart palpitations   . Arthritis     osteoarthritis-hips,knees, back, hands  . Hepatitis     hepatitis A; past hx.,many yrs ago ? food source  . Asymmetric septal hypertrophy (HCC)   . Fatty liver   . Atrial fibrillation (HCC)   . Gallstones   . Hyperlipidemia   . Obesity     Past Surgical History  Procedure Laterality Date  . Appendectomy  2004  . Cholecystectomy    . Retinal detachment surgery Bilateral     remains with slight hazy vision with lights  . Cataract extraction w/ intraocular lens implant      both eyes  . Total hip arthroplasty Right  08/04/2014    Procedure: RIGHT TOTAL HIP ARTHROPLASTY ANTERIOR APPROACH;  Surgeon: Loanne Drilling, MD;  Location: [facility];  Service: Orthopedics;  Laterality: Right;  . Total hip arthroplasty Left 01/05/2015    Procedure: LEFT TOTAL HIP ARTHROPLASTY ANTERIOR APPROACH;  Surgeon: Loanne Drilling, MD;  Location: [facility];  Service: Orthopedics;  Laterality: Left;  . US echocardiography  06/25/2012    Moderate ASH,LV hyperdynamic,LA is mod. dilated,trace MR,TR,AI  . Nm myocar perf wall motion  08/15/04    No ischemia    Social History Victoria Delgado  reports that she has quit smoking. She has never used smokeless tobacco. She reports that she does not drink alcohol or use illicit drugs.  family history includes CVA in her mother; Diabetes in her other; Esophageal cancer in her other; Hypertension in her mother; Lung cancer (age of onset: 68) in her father. There is no history of Colon cancer, Pancreatic cancer, Kidney disease, or Liver disease.  Allergies  Allergen Reactions  . Codeine Nausea Only  . Kenalog [Triamcinolone Acetonide]     Had A.fib after she received the injection."Steroid meds"  . Relafen [Nabumetone]     Edema   . Statins     Possible myositis   . Tramadol     Auditory halllucinations  . Penicillins Rash    Years ago, injection site was red and swollen.  PHYSICAL EXAMINATION: Vital signs: BP 142/72 mmHg  Pulse 64  Ht  (1.727 m)  Wt 294 lb (133.358 kg)  BMI 44.71 kg/m2  Constitutional: generally well-appearing, no acute distress Psychiatric: alert and oriented x3, cooperative Eyes: extraocular movements intact, anicteric, conjunctiva pink Mouth: oral pharynx moist, no lesions Neck: supple no lymphadenopathy Cardiovascular: heart regular rate and rhythm, no murmur Lungs: clear to auscultation bilaterally Abdomen: soft,Obese, nontender, nondistended, no obvious ascites, no peritoneal signs, normal bowel sounds, no organomegaly Rectal:Omitted Extremities:  no clubbing cyanosis or lower extremity edema bilaterally Skin: no lesions on visible extremities Neuro: No focal deficits. Cranial nerves intact  ASSESSMENT:  #1. Recent problems with right neck discomfort. Non-GI. Resolved. Defer to ENT if problems recur #2. Chronic GERD. No alarm features. Managed with twice a day H2 receptor antagonist therapy #3. Morbid obesity #4 Colon cancer screening. Previous screening colonoscopy 2003 negative. Discussed screening colonoscopy versus cologuard. Her husband did cologuard. She is opting for cologuard understanding that if it were positive she will need optical colonoscopy   PLAN:  #1. Reflux precautions with attention to weight loss #2. Continue H2 receptor antagonist therapy for GERD #3. Cologuard screening. Appropriate candidate. We'll contact her with results when available. If negative could consider repeating in 3 years. She would be 77

## 2016-04-27 ENCOUNTER — Telehealth: Payer: Self-pay | Admitting: Cardiology

## 2016-04-27 NOTE — Telephone Encounter (Signed)
Closed Encounter  °

## 2016-05-04 ENCOUNTER — Telehealth: Payer: Self-pay | Admitting: Internal Medicine

## 2016-05-04 NOTE — Telephone Encounter (Signed)
Spoke with patient and told her that, per our Cologuard representative, she should be fine doing her Cologuard as long as no known blood in her stool.  Patient acknowledged and understood.

## 2016-05-07 ENCOUNTER — Telehealth: Payer: Self-pay | Admitting: *Deleted

## 2016-05-07 ENCOUNTER — Ambulatory Visit (INDEPENDENT_AMBULATORY_CARE_PROVIDER_SITE_OTHER): Payer: Medicare Other | Admitting: Nurse Practitioner

## 2016-05-07 ENCOUNTER — Encounter: Payer: Self-pay | Admitting: Nurse Practitioner

## 2016-05-07 VITALS — BP 142/82 | HR 67 | Ht 68.0 in | Wt 292.4 lb

## 2016-05-07 DIAGNOSIS — I259 Chronic ischemic heart disease, unspecified: Secondary | ICD-10-CM

## 2016-05-07 DIAGNOSIS — I422 Other hypertrophic cardiomyopathy: Secondary | ICD-10-CM

## 2016-05-07 DIAGNOSIS — I10 Essential (primary) hypertension: Secondary | ICD-10-CM

## 2016-05-07 DIAGNOSIS — Z955 Presence of coronary angioplasty implant and graft: Secondary | ICD-10-CM

## 2016-05-07 DIAGNOSIS — E785 Hyperlipidemia, unspecified: Secondary | ICD-10-CM | POA: Diagnosis not present

## 2016-05-07 DIAGNOSIS — I350 Nonrheumatic aortic (valve) stenosis: Secondary | ICD-10-CM

## 2016-05-07 LAB — BASIC METABOLIC PANEL
BUN: 16 mg/dL (ref 7–25)
CO2: 27 mmol/L (ref 20–31)
Calcium: 9.7 mg/dL (ref 8.6–10.4)
Chloride: 103 mmol/L (ref 98–110)
Creat: 0.82 mg/dL (ref 0.60–0.93)
Glucose, Bld: 118 mg/dL — ABNORMAL HIGH (ref 65–99)
Potassium: 4.8 mmol/L (ref 3.5–5.3)
Sodium: 142 mmol/L (ref 135–146)

## 2016-05-07 LAB — HEPATIC FUNCTION PANEL
ALT: 15 U/L (ref 6–29)
AST: 13 U/L (ref 10–35)
Albumin: 4.1 g/dL (ref 3.6–5.1)
Alkaline Phosphatase: 88 U/L (ref 33–130)
Bilirubin, Direct: 0.1 mg/dL (ref <=0.2)
Indirect Bilirubin: 0.3 mg/dL (ref 0.2–1.2)
Total Bilirubin: 0.4 mg/dL (ref 0.2–1.2)
Total Protein: 6.8 g/dL (ref 6.1–8.1)

## 2016-05-07 LAB — CBC
HCT: 45 % (ref 35.0–45.0)
Hemoglobin: 15.3 g/dL (ref 11.7–15.5)
MCH: 29.9 pg (ref 27.0–33.0)
MCHC: 34 g/dL (ref 32.0–36.0)
MCV: 87.9 fL (ref 80.0–100.0)
MPV: 10.5 fL (ref 7.5–12.5)
Platelets: 269 10*3/uL (ref 140–400)
RBC: 5.12 MIL/uL — ABNORMAL HIGH (ref 3.80–5.10)
RDW: 14.1 % (ref 11.0–15.0)
WBC: 9.7 10*3/uL (ref 3.8–10.8)

## 2016-05-07 LAB — TSH: TSH: 2.83 mIU/L

## 2016-05-07 MED ORDER — NITROGLYCERIN 0.4 MG SL SUBL: 0.4000 mg | SUBLINGUAL_TABLET | SUBLINGUAL | Status: DC | PRN

## 2016-05-07 NOTE — Telephone Encounter (Signed)
S/w Hilma Favors at our [facility] location. Stated she would make pt appointment with Dr. Antoine Poche in 3 weeks and call pt with appointment.

## 2016-05-07 NOTE — Patient Instructions (Addendum)
We will be checking the following labs today - BMET, CBC, HPF and TSH   Medication Instructions:    Continue with your current medicines.  I have sent in a RX for NTG - Use your NTG under your tongue for recurrent chest pain. May take one tablet every 5 minutes. If you are still having discomfort after 3 tablets in 15 minutes, call 911.     Testing/Procedures To Be Arranged:  Echocardiogram  Follow-Up:   See Dr. Antoine Poche at the end of the month as planned  Will send message to cardiac rehab    Other Special Instructions:    We will need a medical release from [facility] Here are my tips to lose weight:  1. Drink only water. You do not need milk, juice, tea, soda or diet soda.  2. Do not eat anything "white". This includes white bread, potatoes, rice or mayo  3. Stay away from fried foods and sweets  4. Your portion should be the size of the palm of your hand.  5. Know what your weaknesses are and avoid.  6. Find an exercise you like and do it every day for 45 to 60 minutes.         If you need a refill on your cardiac medications before your next appointment, please call your pharmacy.   Call the [facility] office at [phone] if you have any questions, problems or concerns.

## 2016-05-07 NOTE — Addendum Note (Signed)
Addended by: Rosalio Macadamia on: 05/07/2016 12:47 PM   Modules accepted: Kipp Brood

## 2016-05-07 NOTE — Progress Notes (Addendum)
CARDIOLOGY OFFICE NOTE  Date:  05/07/2016    [name] Date of Birth: [birth year] Medical Record #[id number]  PCP:  Levon Hedger, MD  Cardiologist:  [facility]    Chief Complaint  Patient presents with  . Coronary Artery Disease  . Hyperlipidemia  . Atrial Fibrillation    Post hospital visit - seen for Dr. Antoine Poche    History of Present Illness: Victoria Delgado is a 75 y.o. female who presents today for what was to be a TOC visit - no phone call documented however. She is seen for Dr. Antoine Poche.   Seen for the first time a year ago. Noted to have had PAF - she thought was related to a steroid injection. This was in 2008. At that time she was found to have asymmetric septal hypertrophy. This has been followed conservatively.   Comes in today. Here with her husband. Admitted 5/24 after waking up with left arm pain - 2nd troponin + - had cath with PCI to the mid LCX and 1st DX. She is on DAPT with aspirin/Effient.  No records other than her discharge instructions. Back on statin therapy - says she is going to take it "no matter if my legs fall off". She has done ok since discharge. No more arm pain. She was back in the ER back in March here - this was for neck pain - she wonders if this was heart related - did NOT have this with this most recent event. She has gained a considerable amount of weight. She would like to go back to pool exercise. She has had some shortness of breath but feels this is better since the stents. No syncope noted. She says she did not get an echo.   Past Medical History  Diagnosis Date  . Cataract     corrective surgery done  . Hypertension   . GERD (gastroesophageal reflux disease)   . Hyperlipidemia   . Sleep apnea     CPAP  . Hernia, hiatal   . Heart palpitations   . Arthritis     osteoarthritis-hips,knees, back, hands  . Hepatitis     hepatitis A; past hx.,many yrs ago ? food source  . Asymmetric septal hypertrophy (HCC)   . Fatty liver     . Atrial fibrillation (HCC)   . Gallstones   . Hyperlipidemia   . Obesity     Past Surgical History  Procedure Laterality Date  . Appendectomy  2004  . Cholecystectomy    . Retinal detachment surgery Bilateral     remains with slight hazy vision with lights  . Cataract extraction w/ intraocular lens implant      both eyes  . 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 Total hip arthroplasty Left 01/05/2015    Procedure: LEFT TOTAL HIP ARTHROPLASTY ANTERIOR APPROACH;  Surgeon: Loanne Drilling, MD;  Location: [facility];  Service: Orthopedics;  Laterality: Left;  . US echocardiography  06/25/2012    Moderate ASH,LV hyperdynamic,LA is mod. dilated,trace MR,TR,AI  . Nm myocar perf wall motion  08/15/04    No ischemia     Medications: Current Outpatient Prescriptions  Medication Sig Dispense Refill  . aspirin EC 81 MG tablet Take 81 mg by mouth daily.    Marland Kitchen CARTIA XT 240 MG 24 hr capsule TAKE 1 CAPSULE(240 MG)  BY MOUTH AT BEDTIME 90 capsule 1  . EFFIENT 10 MG TABS tablet daily  0  . furosemide (LASIX) 40 MG tablet Take one daily with your potassium pill 90 tablet 3  . potassium chloride (K-DUR,KLOR-CON) 10 MEQ tablet Take 10 mEq by mouth daily.     . ranitidine (ZANTAC) 150 MG tablet Take 150 mg by mouth 2 (two) times daily.    . simvastatin (ZOCOR) 40 MG tablet Take 40 mg by mouth daily.    . TOPROL XL 50 MG 24 hr tablet TAKE 1 TABLET BY MOUTH EVERY MORNING AND 1/2 TABLET EVERY EVENING 45 tablet 10  . nitroGLYCERIN (NITROSTAT) 0.4 MG SL tablet Place 1 tablet (0.4 mg total) under the tongue every 5 (five) minutes as needed for chest pain. 25 tablet 3   No current facility-administered medications for this visit.    Allergies: Allergies  Allergen Reactions  . Codeine Nausea Only  . Kenalog [Triamcinolone Acetonide]     Had A.fib after  she received the injection."Steroid meds"  . Relafen [Nabumetone]     Edema   . Statins     Possible myositis   . Tramadol     Auditory halllucinations  . Penicillins Rash    Years ago, injection site was red and swollen.    Social History: The patient  reports that she has quit smoking. She has never used smokeless tobacco. She reports that she does not drink alcohol or use illicit drugs.   Family History: The patient's family history includes CVA in her mother; Diabetes in her other; Esophageal cancer in her other; Hypertension in her mother; Lung cancer (age of onset: 57) in her father. There is no history of Colon cancer, Pancreatic cancer, Kidney disease, or Liver disease.   Review of Systems: Please see the history of present illness.   Otherwise, the review of systems is positive for none.   All other systems are reviewed and negative.   Physical Exam: VS:  BP 142/82 mmHg  Pulse 67  Ht 5\' 8"  (1.727 m)  Wt 292 lb 6.4 oz (132.632 kg)  BMI 44.47 kg/m2 .  BMI Body mass index is 44.47 kg/(m^2).  Wt Readings from Last 3 Encounters:  05/07/16 292 lb 6.4 oz (132.632 kg)  04/23/16 294 lb (133.358 kg)  02/24/16 270 lb (122.471 kg)    General: Pleasant. Morbidly obese. Very pleasant. She is in no acute distress.  HEENT: Normal. Neck: Supple, no JVD, carotid bruits, or masses noted.  Cardiac: Regular rate and rhythm. Harsh outflow murmur. No edema.  Respiratory:  Lungs are clear to auscultation bilaterally with normal work of breathing.  GI: Soft and nontender.  MS: No deformity or atrophy. Gait and ROM intact. Skin: Warm and dry. Color is normal.  Neuro:  Strength and sensation are intact and no gross focal deficits noted.  Psych: Alert, appropriate and with normal affect. Right wrist cath site looks good.    LABORATORY DATA:  EKG:  EKG is ordered today. This demonstrates NSR with lateral Q's.  Lab Results  Component Value Date   WBC 9.3 02/24/2016   HGB 15.0  02/24/2016   HCT 44.0 02/24/2016   PLT 241 02/24/2016   GLUCOSE 139* 02/24/2016   CHOL 199 09/12/2015   TRIG 323* 09/12/2015   HDL 29* 09/12/2015   LDLCALC 105 09/12/2015   ALT 15 09/12/2015   AST 16 09/12/2015   NA 140 02/24/2016   K 4.5 02/24/2016   CL 103 02/24/2016  CREATININE 1.00 02/24/2016   BUN 18 02/24/2016   CO2 26 01/07/2015   TSH 2.553 04/20/2014   INR 1.03 12/27/2014    BNP (last 3 results) No results for input(s): BNP in the last 8760 hours.  ProBNP (last 3 results) No results for input(s): PROBNP in the last 8760 hours.   Other Studies Reviewed Today:   Echo Study Conclusions from 05/2014  - Left ventricle: E/e&'>17.2 suggestive of elevate LV filling pressure. The cavity size was normal. There was moderate concentric hypertrophy. Systolic function was normal. The estimated ejection fraction was in the range of 60% to 65%. Wall motion was normal; there were no regional wall motion abnormalities. There was an increased relative contribution of atrial contraction to ventricular filling. Doppler parameters are consistent with abnormal left ventricular relaxation (grade 1 diastolic dysfunction). - Aortic valve: Moderate thickening and calcification. There was moderate stenosis. There was mild regurgitation. 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 - Mitral valve: Moderately calcified annulus. Mildly thickened leaflets . - Left atrium: The atrium was mildly dilated.  Assessment/Plan: 1. Recent MI with PCI to the mid LX and 1st DX - on DAPT for at least one year. Will need her cath CD reviewed. Request for records. Doing well clinically. Lab today. Refer to cardiac rehab.   2. Aortic stenosis - needs echo updated.   3. HTN - BP fair - she notes she is nervous today  4. Morbid obesity - discussed at length. My tips given to her today.   5. Hypertrophic CM - checking echo  6. HLD - prior statin intolerance - may  need to reconsider going back to lipid clinic for PCSK9 therapy.   Current medicines are reviewed with the patient today.  The patient does not have concerns regarding medicines other than what has been noted above.  The following changes have been made:  See above.  Labs/ tests ordered today include:    Orders Placed This Encounter  Procedures  . Basic metabolic panel  . CBC  . Hepatic function panel  . TSH  . EKG 12-Lead  . ECHOCARDIOGRAM COMPLETE     Disposition:   FU with Dr.Hochrein after echo later this month (she had an appointment for later this month that was cancelled).   Patient is agreeable to this plan and will call if any problems develop in the interim.   Signed: Rosalio Macadamia, RN, ANP-C 05/07/2016 10:04 AM  [facility] [address] [place], [place]  [address] Phone: [phone] Fax: [phone]   Addendum:  I have had her cath CD reviewed by Dr. Clifton James here in the office today - he notes normal LAD, normal RCA, 60 to 70% diagonal with stent placed and about 80% mid LCX with stent placed. Normal LV function.   Rosalio Macadamia, RN, ANP-C [facility] [address] [place], [place]  [address] [phone]

## 2016-05-09 ENCOUNTER — Ambulatory Visit: Payer: Self-pay | Admitting: Cardiology

## 2016-05-09 NOTE — Telephone Encounter (Signed)
Still waiting for scheduler to find spot stated still working on it.

## 2016-05-11 ENCOUNTER — Encounter (HOSPITAL_COMMUNITY): Payer: Self-pay | Admitting: Emergency Medicine

## 2016-05-11 ENCOUNTER — Emergency Department (HOSPITAL_COMMUNITY)
Admission: EM | Admit: 2016-05-11 | Discharge: 2016-05-12 | Disposition: A | Discharge status: Home / Self Care | Payer: Medicare Other | Attending: Emergency Medicine | Admitting: Emergency Medicine

## 2016-05-11 ENCOUNTER — Emergency Department (HOSPITAL_COMMUNITY): Payer: Medicare Other

## 2016-05-11 DIAGNOSIS — Z79899 Other long term (current) drug therapy: Secondary | ICD-10-CM | POA: Insufficient documentation

## 2016-05-11 DIAGNOSIS — Z96643 Presence of artificial hip joint, bilateral: Secondary | ICD-10-CM | POA: Diagnosis not present

## 2016-05-11 DIAGNOSIS — M79602 Pain in left arm: Secondary | ICD-10-CM | POA: Diagnosis not present

## 2016-05-11 DIAGNOSIS — I10 Essential (primary) hypertension: Secondary | ICD-10-CM | POA: Diagnosis not present

## 2016-05-11 DIAGNOSIS — Z87891 Personal history of nicotine dependence: Secondary | ICD-10-CM | POA: Insufficient documentation

## 2016-05-11 DIAGNOSIS — R002 Palpitations: Secondary | ICD-10-CM | POA: Diagnosis not present

## 2016-05-11 DIAGNOSIS — Z7982 Long term (current) use of aspirin: Secondary | ICD-10-CM | POA: Diagnosis not present

## 2016-05-11 DIAGNOSIS — E785 Hyperlipidemia, unspecified: Secondary | ICD-10-CM | POA: Insufficient documentation

## 2016-05-11 DIAGNOSIS — R079 Chest pain, unspecified: Secondary | ICD-10-CM | POA: Diagnosis present

## 2016-05-11 LAB — CBC
HEMATOCRIT: 42.4 % (ref 36.0–46.0)
Hemoglobin: 14 g/dL (ref 12.0–15.0)
MCH: 29.9 pg (ref 26.0–34.0)
MCHC: 33 g/dL (ref 30.0–36.0)
MCV: 90.4 fL (ref 78.0–100.0)
Platelets: 238 10*3/uL (ref 150–400)
RBC: 4.69 MIL/uL (ref 3.87–5.11)
RDW: 13.5 % (ref 11.5–15.5)
WBC: 8.7 10*3/uL (ref 4.0–10.5)

## 2016-05-11 LAB — BASIC METABOLIC PANEL
Anion gap: 11 (ref 5–15)
BUN: 19 mg/dL (ref 6–20)
CALCIUM: 9.2 mg/dL (ref 8.9–10.3)
CO2: 24 mmol/L (ref 22–32)
CREATININE: 0.87 mg/dL (ref 0.44–1.00)
Chloride: 104 mmol/L (ref 101–111)
GFR calc Af Amer: >60 mL/min (ref >60)
GLUCOSE: 135 mg/dL — AB (ref 65–99)
POTASSIUM: 3.7 mmol/L (ref 3.5–5.1)
SODIUM: 139 mmol/L (ref 135–145)

## 2016-05-11 LAB — I-STAT TROPONIN, ED: Troponin i, poc: 0.02 ng/mL (ref 0.00–0.08)

## 2016-05-11 NOTE — Telephone Encounter (Signed)
Victoria Delgado called today to let us know appointment has been made with Dr. Antoine Poche.

## 2016-05-11 NOTE — ED Provider Notes (Signed)
CSN: [id number]     Arrival date & time 05/11/16  2015 History   First MD Initiated Contact with Patient 05/11/16 2107     Chief Complaint  Patient presents with  . Chest Pain  . Shortness of Breath     (Consider location/radiation/quality/duration/timing/severity/associated sxs/prior Treatment) HPI   Victoria Delgado is a 75 y.o. female who presents for evaluation of left arm pain which lasted 1 hour, from the left shoulder to the left elbow, which reminded her of her pain, when she recently had an MI. She was seen and evaluated for it, 2 weeks ago was diagnosed with "small heart attack", had cardiac catheterization with resulting stent 2. This procedure was done in [place], [place]. Tonight the pain was accompanied by palpitations, intermittently. There was no syncope, nausea, vomiting, shortness of breath or diaphoresis. She took an extra aspirin. She does not use nitroglycerin. She followed up with her cardiologist here in [place], one week ago, and they plan on seeing her again in a month. She has been eating well. She denies dizziness, paresthesias, back pain. There are no other no modifying factors.   Past Medical History  Diagnosis Date  . Cataract     corrective surgery done  . Hypertension   . GERD (gastroesophageal reflux disease)   . Hyperlipidemia   . Sleep apnea     CPAP  . Hernia, hiatal   . Heart palpitations   . Arthritis     osteoarthritis-hips,knees, back, hands  . Hepatitis     hepatitis A; past hx.,many yrs ago ? food source  . Asymmetric septal hypertrophy (HCC)   . Fatty liver   . Atrial fibrillation (HCC)   . Gallstones   . Hyperlipidemia   . Obesity    Past Surgical History  Procedure Laterality Date  . Appendectomy  2004  . Cholecystectomy    . Retinal detachment surgery Bilateral     remains with slight hazy vision with lights  . Cataract extraction w/ intraocular lens implant      both eyes  . Total hip arthroplasty Right 08/04/2014     Procedure: RIGHT TOTAL HIP ARTHROPLASTY ANTERIOR APPROACH;  Surgeon: Loanne Drilling, MD;  Location: [facility];  Service: Orthopedics;  Laterality: Right;  . Total hip arthroplasty Left 01/05/2015    Procedure: LEFT TOTAL HIP ARTHROPLASTY ANTERIOR APPROACH;  Surgeon: Loanne Drilling, MD;  Location: [facility];  Service: Orthopedics;  Laterality: Left;  . US echocardiography  06/25/2012    Moderate ASH,LV hyperdynamic,LA is mod. dilated,trace MR,TR,AI  . Nm myocar perf wall motion  08/15/04    No ischemia   Family History  Problem Relation Age of Onset  . Lung cancer Father 27    asbestos exposure  . Hypertension Mother   . CVA Mother   . Colon cancer Neg Hx   . Esophageal cancer Other     nephew  . Pancreatic cancer Neg Hx   . Diabetes Other     nephew  . Kidney disease Neg Hx   . Liver disease Neg Hx    Social History  Substance Use Topics  . Smoking status: Former Games developer  . Smokeless tobacco: Never Used     Comment: QUIT IN 69  . Alcohol Use: No   OB History    Gravida Para Term Preterm AB TAB SAB Ectopic Multiple Living   2 2             Review of Systems  All  other systems reviewed and are negative.     Allergies  Codeine; Kenalog; Relafen; Statins; Tramadol; and Penicillins  Home Medications   Prior to Admission medications   Medication Sig Start Date End Date Taking? Authorizing Provider  aspirin EC 81 MG tablet Take 81 mg by mouth daily.   Yes Historical Provider, MD  CARTIA XT 240 MG 24 hr capsule TAKE 1 CAPSULE(240 MG) BY MOUTH AT BEDTIME 11/22/15  Yes Rollene Rotunda, MD  furosemide (LASIX) 40 MG tablet Take one daily with your potassium pill 10/21/15  Yes Rollene Rotunda, MD  metoprolol succinate (TOPROL-XL) 25 MG 24 hr tablet Take 25 mg by mouth every evening. Take along with the 50mg  tablet   Yes Historical Provider, MD  metoprolol succinate (TOPROL-XL) 50 MG 24 hr tablet Take 50 mg by mouth every morning. Take with or immediately following a meal. Take along  with the 25mg  tablet   Yes Historical Provider, MD  nitroGLYCERIN (NITROSTAT) 0.4 MG SL tablet Place 1 tablet (0.4 mg total) under the tongue every 5 (five) minutes as needed for chest pain. 05/07/16  Yes Rosalio Macadamia, NP  potassium chloride (K-DUR,KLOR-CON) 10 MEQ tablet Take 10 mEq by mouth daily.    Yes Historical Provider, MD  prasugrel (EFFIENT) 10 MG TABS tablet Take 10 mg by mouth daily.   Yes Historical Provider, MD  ranitidine (ZANTAC) 150 MG tablet Take 150 mg by mouth 2 (two) times daily.   Yes Historical Provider, MD  simvastatin (ZOCOR) 40 MG tablet Take 40 mg by mouth daily.   Yes Historical Provider, MD  TOPROL XL 50 MG 24 hr tablet TAKE 1 TABLET BY MOUTH EVERY MORNING AND 1/2 TABLET EVERY EVENING Patient not taking: Reported on 05/11/2016 07/14/15   Rollene Rotunda, MD   BP 131/53 mmHg  Pulse 71  Temp(Src) 97.8 F (36.6 C) (Oral)  Resp 21  Ht 5\' 8"  (1.727 m)  Wt 285 lb (129.275 kg)  BMI 43.34 kg/m2  SpO2 96% Physical Exam  Constitutional: She is oriented to person, place, and time. She appears well-developed.  Obese  HENT:  Head: Normocephalic and atraumatic.  Right Ear: External ear normal.  Left Ear: External ear normal.  Eyes: Conjunctivae and EOM are normal. Pupils are equal, round, and reactive to light.  Neck: Normal range of motion and phonation normal. Neck supple.  Cardiovascular: Normal rate, regular rhythm and normal heart sounds.   Pulmonary/Chest: Effort normal and breath sounds normal. No respiratory distress. She exhibits no bony tenderness.  Abdominal: Soft. She exhibits no distension. There is no tenderness.  Musculoskeletal: Normal range of motion. She exhibits no edema or tenderness.  Neurological: She is alert and oriented to person, place, and time. No cranial nerve deficit or sensory deficit. She exhibits normal muscle tone. Coordination normal.  Skin: Skin is warm, dry and intact.  Psychiatric: She has a normal mood and affect. Her behavior is  normal. Judgment and thought content normal.  Nursing note and vitals reviewed.   ED Course  Procedures (including critical care time)  Initial clinical impression- atypical for cardiac pain, with palpitations. Low risk for acute coronary syndrome, having been cathed and stented. Initial troponin normal. We'll check a delta troponin, and reevaluate.  Medications - No data to display  Patient Vitals for the past 24 hrs:  BP Temp Temp src Pulse Resp SpO2 Height Weight  05/11/16 2120 - - - - - - 5\' 8"  (1.727 m) 285 lb (129.275 kg)  05/11/16 2119 - 97.8  F (36.6 C) Oral - - - - -  05/11/16 2045 (!) 131/53 mmHg - - 71 21 96 % - -  05/11/16 2042 - - - 67 21 96 % - -  05/11/16 2035 - - - - - 96 % - -  05/11/16 2030 145/69 mmHg - - 68 16 96 % - -    12:27 AM Reevaluation with update and discussion. After initial assessment and treatment, an updated evaluation reveals No palpitations or pain at this time. Findings discussed with patient and husband, all questions answered. Melven Stockard L    Labs Review Labs Reviewed  BASIC METABOLIC PANEL - Abnormal; Notable for the following:    Glucose, Bld 135 (*)    All other components within normal limits  CBC  I-STAT TROPOININ, ED  Rosezena Sensor, ED    Imaging Review Dg Chest 2 View  05/11/2016  CLINICAL DATA:  MI with 2 stents placed 2 weeks ago. Patients symptoms then were left arm pain. Left arm pain started again today. Hx of HTN. EXAM: CHEST  2 VIEW COMPARISON:  07/28/2014 FINDINGS: Midline trachea. Borderline cardiomegaly. Atherosclerosis in the transverse aorta. No pleural effusion or pneumothorax. Clear lungs. IMPRESSION: Borderline cardiomegaly, without acute disease. Aortic atherosclerosis. Electronically Signed   By: Jeronimo Greaves M.D.   On: 05/11/2016 21:18   I have personally reviewed and evaluated these images and lab results as part of my medical decision-making.   EKG Interpretation   Date/Time:  Friday May 11 2016  20:27:05 EDT Ventricular Rate:  73 PR Interval:  193 QRS Duration: 104 QT Interval:  430 QTC Calculation: 474 R Axis:   -102 Text Interpretation:  Sinus rhythm Incomplete RBBB and LAFB RSR' in V1 or  V2, right VCD or RVH since last tracing no significant change Confirmed by  [facility]  MD, Larkyn Greenberger ([id number]) on 05/11/2016 9:14:21 PM      MDM   Final diagnoses:  Palpitations  Left arm pain    Nonspecific palpitations and left arm discomfort. Doubt ACS, PE, pneumonia, or impending vascular collapse.  Nursing Notes Reviewed/ Care Coordinated Applicable Imaging Reviewed Interpretation of Laboratory Data incorporated into ED treatment  The patient appears reasonably screened and/or stabilized for discharge and I doubt any other medical condition or other [facility] requiring further screening, evaluation, or treatment in the ED at this time prior to discharge.  Plan: Home Medications- usual; Home Treatments- rest; return here if the recommended treatment, does not improve the symptoms; Recommended follow up- PCP prn     Mancel Bale, MD 05/12/16 [phone]

## 2016-05-11 NOTE — ED Notes (Signed)
Patient transported to X-ray 

## 2016-05-11 NOTE — ED Notes (Signed)
Pt began to experience dull left arm pain around 8pm tonight, denies diaphoresis, N/V/SOB. Pt had two stints done on the 24th of May, at that time she did not have chest pain.  EMS stated EKG was unremarkable.  Pt reports that her "PVCs have increased since the stints were completed.

## 2016-05-12 LAB — I-STAT TROPONIN, ED: Troponin i, poc: 0.01 ng/mL (ref 0.00–0.08)

## 2016-05-12 NOTE — Discharge Instructions (Signed)
Continue your regular medications. Follow-up with your cardiac physician if not better in 2 or 3 days. Return here if needed, for problems.   Palpitations A palpitation is the feeling that your heartbeat is irregular or is faster than normal. It may feel like your heart is fluttering or skipping a beat. Palpitations are usually not a serious problem. However, in some cases, you may need further medical evaluation. CAUSES  Palpitations can be caused by:  Smoking.  Caffeine or other stimulants, such as diet pills or energy drinks.  Alcohol.  Stress and anxiety.  Strenuous physical activity.  Fatigue.  Certain medicines.  Heart disease, especially if you have a history of irregular heart rhythms (arrhythmias), such as atrial fibrillation, atrial flutter, or supraventricular tachycardia.  An improperly working pacemaker or defibrillator. DIAGNOSIS  To find the cause of your palpitations, your health care provider will take your medical history and perform a physical exam. Your health care provider may also have you take a test called an ambulatory electrocardiogram (ECG). An ECG records your heartbeat patterns over a 24-hour period. You may also have other tests, such as:  Transthoracic echocardiogram (TTE). During echocardiography, sound waves are used to evaluate how blood flows through your heart.  Transesophageal echocardiogram (TEE).  Cardiac monitoring. This allows your health care provider to monitor your heart rate and rhythm in real time.  Holter monitor. This is a portable device that records your heartbeat and can help diagnose heart arrhythmias. It allows your health care provider to track your heart activity for several days, if needed.  Stress tests by exercise or by giving medicine that makes the heart beat faster. TREATMENT  Treatment of palpitations depends on the cause of your symptoms and can vary greatly. Most cases of palpitations do not require any treatment  other than time, relaxation, and monitoring your symptoms. Other causes, such as atrial fibrillation, atrial flutter, or supraventricular tachycardia, usually require further treatment. HOME CARE INSTRUCTIONS   Avoid:  Caffeinated coffee, tea, soft drinks, diet pills, and energy drinks.  Chocolate.  Alcohol.  Stop smoking if you smoke.  Reduce your stress and anxiety. Things that can help you relax include:  A method of controlling things in your body, such as your heartbeats, with your mind (biofeedback).  Yoga.  Meditation.  Physical activity such as swimming, jogging, or walking.  Get plenty of rest and sleep. SEEK MEDICAL CARE IF:   You continue to have a fast or irregular heartbeat beyond 24 hours.  Your palpitations occur more often. SEEK IMMEDIATE MEDICAL CARE IF:  You have chest pain or shortness of breath.  You have a severe headache.  You feel dizzy or you faint. MAKE SURE YOU:  Understand these instructions.  Will watch your condition.  Will get help right away if you are not doing well or get worse.   This information is not intended to replace advice given to you by your health care provider. Make sure you discuss any questions you have with your health care provider.   Document Released: 11/16/2000 Document Revised: 11/24/2013 Document Reviewed: 01/18/2012 Elsevier Interactive Patient Education [employer]

## 2016-05-18 ENCOUNTER — Telehealth: Payer: Self-pay

## 2016-05-18 ENCOUNTER — Telehealth: Payer: Self-pay | Admitting: Cardiology

## 2016-05-18 NOTE — Telephone Encounter (Signed)
Faxed notes to nl 

## 2016-05-18 NOTE — Telephone Encounter (Signed)
Records rec from [facility]-placed in Chart prep bin- also faxed a copy to  [facility] office attention [facility] Records for Hochrein Appt 05/25/16.

## 2016-05-20 ENCOUNTER — Other Ambulatory Visit: Payer: Self-pay | Admitting: Cardiology

## 2016-05-21 ENCOUNTER — Telehealth: Payer: Self-pay | Admitting: Cardiology

## 2016-05-21 NOTE — Telephone Encounter (Signed)
Rx(s) sent to pharmacy electronically.  

## 2016-05-21 NOTE — Telephone Encounter (Signed)
Received records from [facility] for appointment on 05/25/16 with Dr Antoine Poche.  Records given to [facility] (medical records) for Dr Deanne Coffer schedule on 05/25/16. lp

## 2016-05-21 NOTE — Telephone Encounter (Signed)
05/21/2016 Received faxed referral packet from [facility] for upcoming appointment with Dr. Antoine Poche on 05/25/2016.  Records given to [facility].  cbr

## 2016-05-24 ENCOUNTER — Other Ambulatory Visit: Payer: Self-pay

## 2016-05-24 ENCOUNTER — Ambulatory Visit (HOSPITAL_COMMUNITY): Payer: Medicare Other | Attending: Cardiology

## 2016-05-24 DIAGNOSIS — I2510 Atherosclerotic heart disease of native coronary artery without angina pectoris: Secondary | ICD-10-CM | POA: Insufficient documentation

## 2016-05-24 DIAGNOSIS — E669 Obesity, unspecified: Secondary | ICD-10-CM | POA: Diagnosis not present

## 2016-05-24 DIAGNOSIS — Z6841 Body Mass Index (BMI) 40.0 and over, adult: Secondary | ICD-10-CM | POA: Insufficient documentation

## 2016-05-24 DIAGNOSIS — I259 Chronic ischemic heart disease, unspecified: Secondary | ICD-10-CM | POA: Diagnosis not present

## 2016-05-24 DIAGNOSIS — I422 Other hypertrophic cardiomyopathy: Secondary | ICD-10-CM | POA: Diagnosis not present

## 2016-05-24 DIAGNOSIS — Z955 Presence of coronary angioplasty implant and graft: Secondary | ICD-10-CM

## 2016-05-24 DIAGNOSIS — E785 Hyperlipidemia, unspecified: Secondary | ICD-10-CM

## 2016-05-24 DIAGNOSIS — I351 Nonrheumatic aortic (valve) insufficiency: Secondary | ICD-10-CM | POA: Insufficient documentation

## 2016-05-24 DIAGNOSIS — I119 Hypertensive heart disease without heart failure: Secondary | ICD-10-CM | POA: Insufficient documentation

## 2016-05-24 DIAGNOSIS — I350 Nonrheumatic aortic (valve) stenosis: Secondary | ICD-10-CM | POA: Diagnosis present

## 2016-05-24 DIAGNOSIS — I10 Essential (primary) hypertension: Secondary | ICD-10-CM | POA: Diagnosis not present

## 2016-05-24 LAB — ECHOCARDIOGRAM COMPLETE
AO mean calculated velocity dopler: 217 cm/s
AV Area VTI index: 0.53 cm2/m2
AV Area VTI: 1.08 cm2
AV Area mean vel: 1.16 cm2
AV Mean grad: 23 mmHg
AV Peak grad: 51 mmHg
AV VEL mean LVOT/AV: 0.37
AV area mean vel ind: 0.46 cm2/m2
AV peak Index: 0.42
AV pk vel: 356 cm/s
AV vel: 1.36
Ao pk vel: 0.35 m/s
E decel time: 412 msec
E/e' ratio: 30.41
FS: 48 % — AB (ref 28–44)
IVS/LV PW RATIO, ED: 0.92
LA ID, A-P, ES: 47 mm
LA diam end sys: 47 mm
LA diam index: 1.84 cm/m2
LA vol A4C: 74 ml
LA vol index: 32.1 mL/m2
LA vol: 82 mL
LV E/e' medial: 30.41
LV E/e'average: 30.41
LV PW d: 12 mm — AB (ref 0.6–1.1)
LV e' LATERAL: 3.18 cm/s
LVOT SV: 102 mL
LVOT VTI: 32.6 cm
LVOT area: 3.14 cm2
LVOT diameter: 20 mm
LVOT peak VTI: 0.43 cm
LVOT peak grad rest: 6 mmHg
LVOT peak vel: 123 cm/s
MV Dec: 412
MV Peak grad: 4 mmHg
MV pk A vel: 97.2 m/s
MV pk E vel: 96.7 m/s
TDI e' lateral: 3.18
TDI e' medial: 5.15
VTI: 75.4 cm
Valve area index: 0.53
Valve area: 1.36 cm2

## 2016-05-24 NOTE — Progress Notes (Signed)
HPI The patient presents for follow up of atrial fibrillation which she ultimately thinks was related to a steroid injection. This was in 2008. At that time she was found to have asymmetric septal hypertrophy. This has been followed conservatively.  Echocardiogram did demonstrate concentric LV hypertrophy which was moderate and moderate LAE.  Since I saw her in May she was in the hospital with CAD.  She was admitted to [facility] on 5/24 after waking up with left arm pain .  She had positive troponin with  PCI to the mid LCX and 1st DX. She is on DAPT with aspirin/Effient.  She presents for follow up.  I was able today to review records from [facility].   She had 2 drug-eluting stents placed in the circumflex leading into a marginal. This was apparently somewhat small circumflex. She also had a diagonal had a drug-eluting stent placed. She had a preserved ejection fraction. Yesterday she came back and had an echocardiogram. There was a well preserved ejection fraction. There was some moderate aortic stenosis. There was no mention of hypertrophic obstruction. Of note she was also in the emergency room a few days ago and I reviewed these records. She has some palpitations and some left arm pain. However, enzymes were negative and EKG was nonacute.  She otherwise is doing well. She's no longer having the palpitations and started eating more walnuts. This increased her magnesium. She denies any chest pressure, neck or arm discomfort. She will get short of breath she's walking around on ground but she does doesn't get short of breath with water exercises  Allergies  Allergen Reactions  . Codeine Nausea Only  . Kenalog [Triamcinolone Acetonide]     Had A.fib after she received the injection."Steroid meds"  . Relafen [Nabumetone]     Edema   . Statins     Possible myositis   . Tramadol     Auditory halllucinations  . Penicillins Rash    Years ago, injection site was red and swollen. Has  patient had a PCN reaction causing immediate rash, facial/tongue/throat swelling, SOB or lightheadedness with hypotension: YES Has patient had a PCN reaction causing severe rash involving mucus membranes or skin necrosis: NO Has patient had a PCN reaction that required hospitalization. No  Has patient had a PCN reaction occurring within the last 10 years: NO If all of the above answers are "NO", then may proceed with Cephalosporin use.     Current Outpatient Prescriptions  Medication Sig Dispense Refill  . aspirin EC 81 MG tablet Take 81 mg by mouth daily.    Marland Kitchen CARTIA XT 240 MG 24 hr capsule TAKE 1 CAPSULE(240 MG) BY MOUTH AT BEDTIME 90 capsule 3  . furosemide (LASIX) 40 MG tablet Take one daily with your potassium pill 90 tablet 3  . nitroGLYCERIN (NITROSTAT) 0.4 MG SL tablet Place 1 tablet (0.4 mg total) under the tongue every 5 (five) minutes as needed for chest pain. 25 tablet 3  . potassium chloride (K-DUR,KLOR-CON) 10 MEQ tablet Take 10 mEq by mouth daily.     . prasugrel (EFFIENT) 10 MG TABS tablet Take 1 tablet (10 mg total) by mouth daily. 90 tablet 3  . ranitidine (ZANTAC) 150 MG tablet Take 150 mg by mouth 2 (two) times daily.    . simvastatin (ZOCOR) 40 MG tablet Take 1 tablet (40 mg total) by mouth daily. 90 tablet 3  . TOPROL XL 50 MG 24 hr tablet TAKE 1 TABLET BY MOUTH  EVERY MORNING AND 1/2 TABLET EVERY EVENING 45 tablet 10   No current facility-administered medications for this visit.    Past Medical History  Diagnosis Date  . Cataract     corrective surgery done  . Hypertension   . GERD (gastroesophageal reflux disease)   . Hyperlipidemia   . Sleep apnea     CPAP  . Hernia, hiatal   . Heart palpitations   . Arthritis     osteoarthritis-hips,knees, back, hands  . Hepatitis     hepatitis A; past hx.,many yrs ago ? food source  . Asymmetric septal hypertrophy (HCC)   . Fatty liver   . Atrial fibrillation (HCC)   . Gallstones   . Hyperlipidemia   . Obesity      Past Surgical History  Procedure Laterality Date  . Appendectomy  2004  . Cholecystectomy    . Retinal detachment surgery Bilateral     remains with slight hazy vision with lights  . Cataract extraction w/ intraocular lens implant      both eyes  . 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 Total hip arthroplasty Left 01/05/2015    Procedure: LEFT TOTAL HIP ARTHROPLASTY ANTERIOR APPROACH;  Surgeon: Loanne Drilling, MD;  Location: [facility];  Service: Orthopedics;  Laterality: Left;  . US echocardiography  06/25/2012    Moderate ASH,LV hyperdynamic,LA is mod. dilated,trace MR,TR,AI  . Nm myocar perf wall motion  08/15/04    No ischemia    ROS:  As stated in the HPI and negative for all other systems.  PHYSICAL EXAM BP 148/85 mmHg  Pulse 69  Ht  (1.727 m)  Wt 292 lb 12.8 oz (132.813 kg)  BMI 44.53 kg/m2 GENERAL:  Well appearing HEENT:  Pupils equal round and reactive, fundi not visualized, oral mucosa unremarkable NECK:  No jugular venous distention, waveform within normal limits, carotid upstroke brisk and symmetric, no bruits, no thyromegaly LYMPHATICS:  No cervical, inguinal adenopathy LUNGS:  Clear to auscultation bilaterally BACK:  No CVA tenderness CHEST:  Unremarkable HEART:  PMI not displaced or sustained,S1 and S2 within normal limits, no S3, no S4, no clicks, no rubs, 3/6 systolic murmur radiating out the aortic outflow tract and into the carotids not increasing with a strain phase of Valsalva, no diastolic murmurs ABD:  Flat, positive bowel sounds normal in frequency in pitch, no bruits, no rebound, no guarding, no midline pulsatile mass, no hepatomegaly, no splenomegaly, obese EXT:  2 plus pulses throughout, trace edema, no cyanosis no clubbing   ASSESSMENT AND PLAN  CAD:  We will enroll her in cardiac  rehabilitation. She will otherwise continue the meds as listed.  Hypertrophic cardiomyopathy - I do not see evidence of this on the recent echo.  No change in therapy.   AS - She does have some moderate aortic stenosis.  I will follow this clinically.    Dyslipidemia - She agrees to stay on Zocor. She'll get a lipid profile liver enzymes in 6 weeks.  Palpitations - She is rarely bothered by these. No change in therapy is indicated.   Hypertension  The blood pressure is elevated. However, she reports it's in the 120s over 60s at home. No change in therapy is indicated.Marland Kitchen No change in medications is indicated. We will continue with therapeutic lifestyle changes (TLC).

## 2016-05-25 ENCOUNTER — Ambulatory Visit (INDEPENDENT_AMBULATORY_CARE_PROVIDER_SITE_OTHER): Payer: Medicare Other | Admitting: Cardiology

## 2016-05-25 ENCOUNTER — Encounter: Payer: Self-pay | Admitting: Cardiology

## 2016-05-25 VITALS — BP 148/85 | HR 69 | Ht 68.0 in | Wt 292.8 lb

## 2016-05-25 DIAGNOSIS — I214 Non-ST elevation (NSTEMI) myocardial infarction: Secondary | ICD-10-CM | POA: Diagnosis not present

## 2016-05-25 DIAGNOSIS — E782 Mixed hyperlipidemia: Secondary | ICD-10-CM | POA: Diagnosis not present

## 2016-05-25 MED ORDER — SIMVASTATIN 40 MG PO TABS: 40.0000 mg | ORAL_TABLET | Freq: Every day | ORAL | Status: DC

## 2016-05-25 MED ORDER — PRASUGREL HCL 10 MG PO TABS: 10.0000 mg | ORAL_TABLET | Freq: Every day | ORAL | Status: DC

## 2016-05-25 NOTE — Patient Instructions (Signed)
Medication Instructions:  Continue current medications  Labwork: Fasting Lipid Liver in 6 Weeks  Testing/Procedures: NONE  Follow-Up: Your physician wants you to follow-up in: 6 Months. You will receive a reminder letter in the mail two months in advance. If you don't receive a letter, please call our office to schedule the follow-up appointment.  You have been referred to Cardiac Rehabilitation   Any Other Special Instructions Will Be Listed Below (If Applicable).  If you need a refill on your cardiac medications before your next appointment, please call your pharmacy.

## 2016-05-30 ENCOUNTER — Encounter: Payer: Self-pay | Admitting: Cardiology

## 2016-05-31 ENCOUNTER — Ambulatory Visit: Payer: Self-pay | Admitting: Cardiology

## 2016-06-15 ENCOUNTER — Other Ambulatory Visit: Payer: Self-pay

## 2016-06-15 LAB — COLOGUARD: COLOGUARD: NEGATIVE

## 2016-06-19 IMAGING — CR DG HIP COMPLETE 2+V*R*
3 series · 3 of 3 positions shown · non-contrast
Comparison: No priors.

CLINICAL DATA: Preoperative evaluation for right hip surgery.

EXAM:
RIGHT HIP - COMPLETE 2+ VIEW

[t pelvis a.p.]
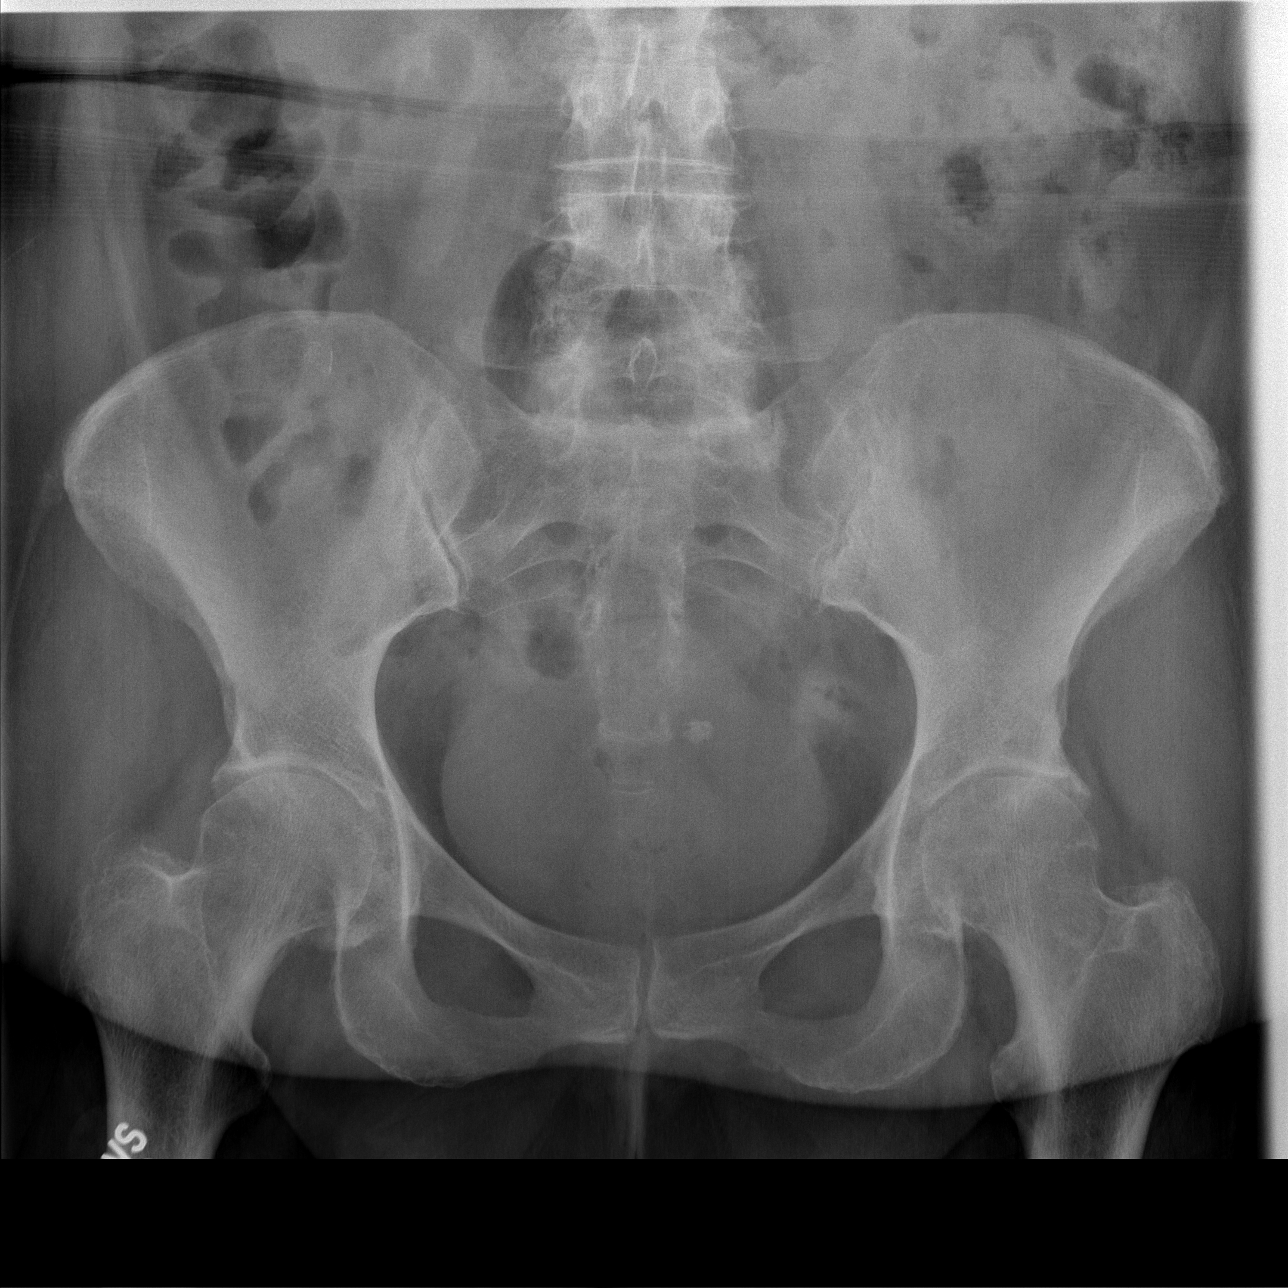

[t hip ap right]
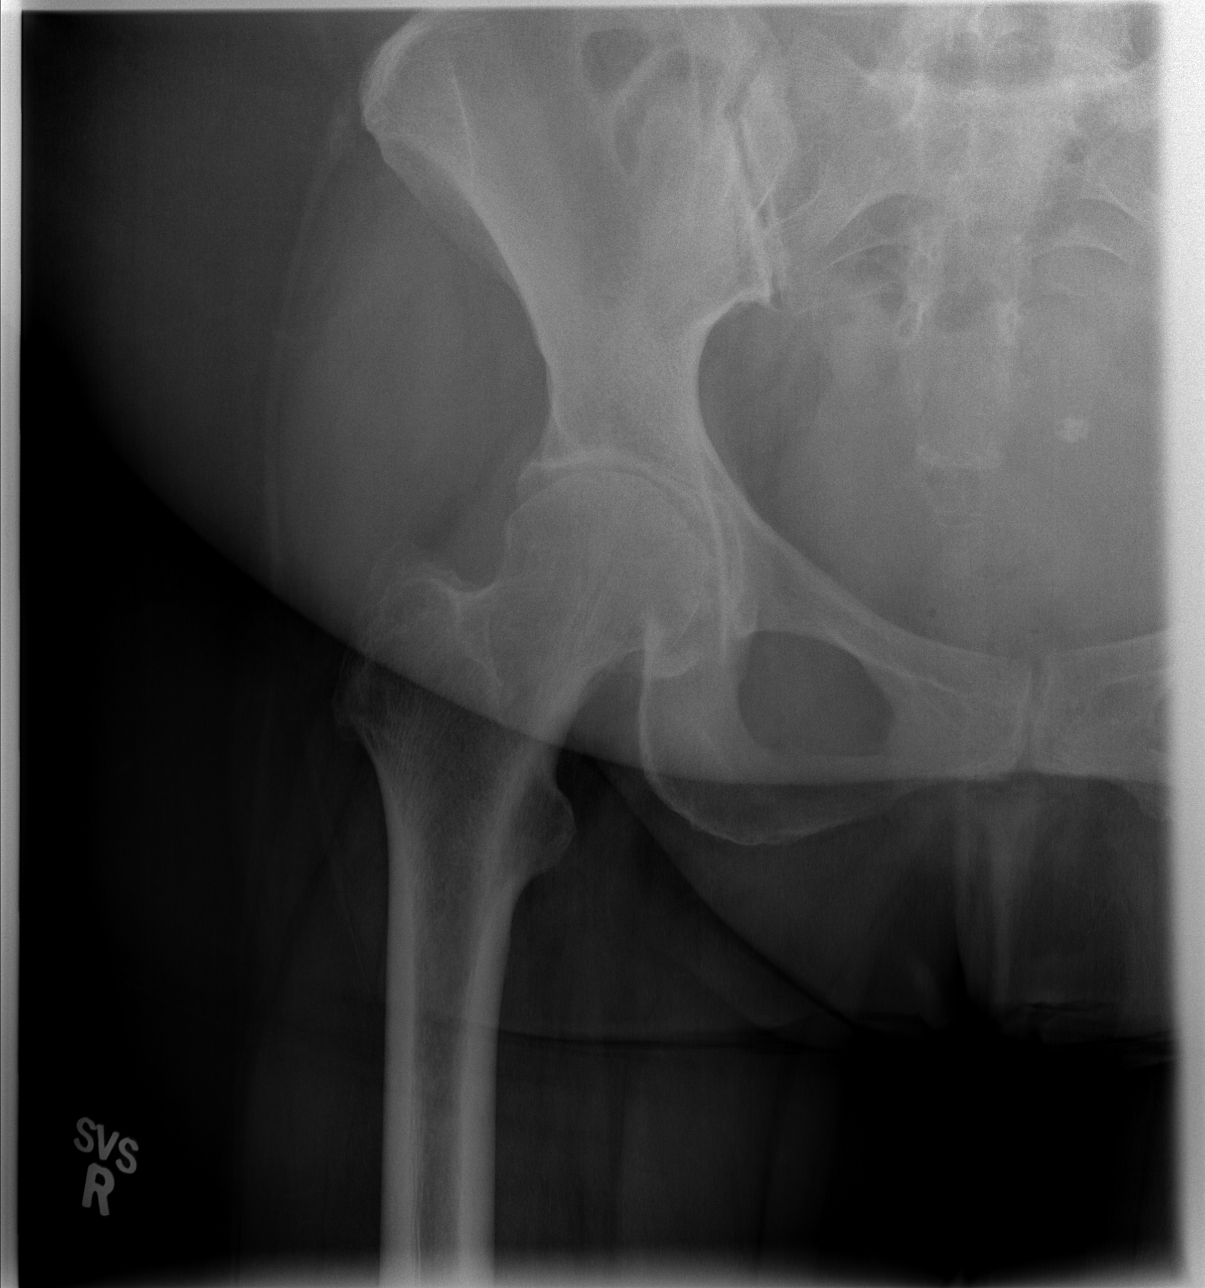

[t hip frog leg right]
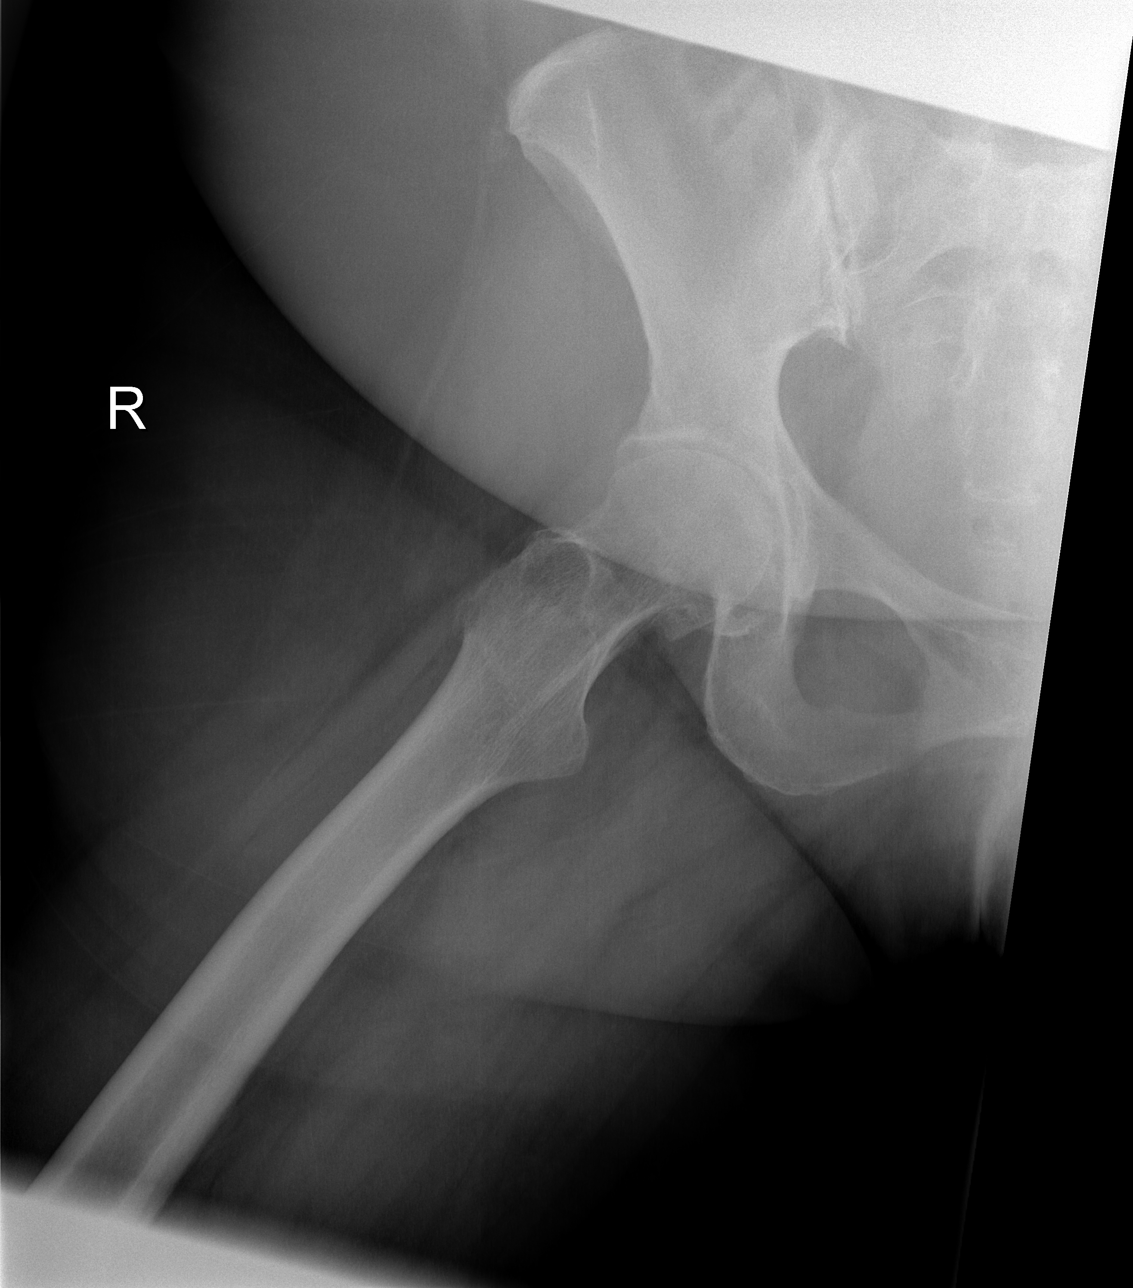

[3 of 3 positions shown; findings below may reference images not displayed]

FINDINGS: AP view of the pelvis and AP and lateral views of the right hip
demonstrate joint space narrowing, subchondral sclerosis,
subchondral cyst formation and osteophyte formation in the hip
joints bilaterally (right greater than left), compatible with
moderate to severe osteoarthritis. No acute displaced fractures. The
right hip is properly located. Small calcification in the central
pelvis may represent a partially calcified fibroid or phlebolith.
IMPRESSION: 1. Moderate to severe bilateral hip joint osteoarthritis (right
greater than left).

## 2016-07-02 ENCOUNTER — Telehealth (HOSPITAL_COMMUNITY): Payer: Self-pay | Admitting: *Deleted

## 2016-07-02 ENCOUNTER — Telehealth: Payer: Self-pay | Admitting: Cardiology

## 2016-07-02 NOTE — Telephone Encounter (Signed)
Please call,she thinks she is having problems with the Effient.

## 2016-07-02 NOTE — Telephone Encounter (Signed)
Agree 

## 2016-07-02 NOTE — Telephone Encounter (Signed)
Returned patient call:  Pt reports 2-3 weeks of digestive issues-diarrhea, gas, bloating, decreased appetite.  States she is concerned it may be due to her effient and/or zocor.  Reports beginning these medications after last hospitalization in May.  Reports intolerance to statins previously but had muscle pain, no GI issues. Denies recent sick contacts.      Will route to clinical pharmacist for recommendations/advice.

## 2016-07-02 NOTE — Telephone Encounter (Signed)
Called pt in regards to upcoming appt for orientation to cardiac rehab.  Pt reports having diarrhea stools for the past 3 weeks whenever she eats something.  Pt at times can not control it thinking she is having gas.  Pt reports gas and bloating when she eats. Pt with 2 new medications Effient and Zocar.  Pt thinks it may be one of those that was recently added. Pt has a call to her cardiologist - Hochrein for further recommendations.  Advised pt that she should contact her primary MD.  Pt indicated that she would call them.  Pt also recently seen by gi doctor and will call them as well.  Will postpone pt orientation due to diarrhea of unknown orgin until 8/22. Will check back with pt for resolution of symptoms prior to starting rehab. Pt verbalized understanding and is in agreement of this. Alanson Aly, BSN

## 2016-07-02 NOTE — Telephone Encounter (Signed)
She was previously intolerant to statins with muscle pains, so would suggest to start by switching Effient to Brilinta 90 mg bid.  If problems persist we can consider switching statin.  Will run this past Dr. Antoine Poche for any other thoughts.

## 2016-07-03 ENCOUNTER — Inpatient Hospital Stay (HOSPITAL_COMMUNITY): Admission: RE | Admit: 2016-07-03 | Payer: Self-pay | Source: Ambulatory Visit

## 2016-07-03 MED ORDER — TICAGRELOR 90 MG PO TABS: 90.0000 mg | ORAL_TABLET | Freq: Two times a day (BID) | ORAL | 5 refills | Status: DC

## 2016-07-03 NOTE — Telephone Encounter (Signed)
Received return call from patient. Discussed recommendations from provider and pharmD. Patient voiced understanding of rationale for med switch. We discussed Brilinta in detail, including freq of administration, medication SEs, and when patient may need to call to report issues. Discussed the potential for this medication to cause feeling of dyspnea and recommended intervention. Pt aware that her digestive issues may take several days to week to begin to resolve - she is aware she should call if any new problems or concerns not discussed. She is aware that the symptoms she has had are more likely to be caused by antiplatelet meds, but if they continue beyond timeframe suggested, she should call so that we can determine need for statin holiday/change.  30 day supply of Brilinta samples at front desk for patient pickup - she is aware. Sent Rx to pharmacy w/ notification of Effient discontinuation.  Pt voiced thanks for the call and assistance.

## 2016-07-03 NOTE — Telephone Encounter (Signed)
Please contact patient about med change

## 2016-07-03 NOTE — Telephone Encounter (Signed)
Left message to call back  

## 2016-07-04 LAB — HEPATIC FUNCTION PANEL
ALT: 18 U/L (ref 6–29)
AST: 16 U/L (ref 10–35)
Albumin: 4.2 g/dL (ref 3.6–5.1)
Alkaline Phosphatase: 82 U/L (ref 33–130)
Bilirubin, Direct: 0.1 mg/dL (ref <=0.2)
Indirect Bilirubin: 0.4 mg/dL (ref 0.2–1.2)
TOTAL PROTEIN: 6.6 g/dL (ref 6.1–8.1)
Total Bilirubin: 0.5 mg/dL (ref 0.2–1.2)

## 2016-07-04 LAB — LIPID PANEL
CHOLESTEROL: 158 mg/dL (ref 125–200)
HDL: 36 mg/dL — ABNORMAL LOW (ref >=46)
LDL Cholesterol: 85 mg/dL (ref <130)
TRIGLYCERIDES: 185 mg/dL — AB (ref <150)
Total CHOL/HDL Ratio: 4.4 Ratio (ref <=5.0)
VLDL: 37 mg/dL — ABNORMAL HIGH (ref <30)

## 2016-07-09 ENCOUNTER — Inpatient Hospital Stay (HOSPITAL_COMMUNITY): Admission: RE | Admit: 2016-07-09 | Payer: Self-pay | Source: Ambulatory Visit

## 2016-07-10 ENCOUNTER — Telehealth: Payer: Self-pay | Admitting: Cardiology

## 2016-07-10 MED ORDER — PRASUGREL HCL 10 MG PO TABS: 10.0000 mg | ORAL_TABLET | Freq: Every day | ORAL | 3 refills | Status: AC

## 2016-07-10 NOTE — Telephone Encounter (Signed)
Patient called following up from last week - we recommended change from Effient to brilinta bc of SE's she'd been having that were thought to be from Effient.  Pt notes when she got home w/ samples last week to start taking the Brilinta, she had discussion w husband who noted she'd been eating a lot of fruit recently and had been taking antacids for heartburn from the fruit and asked her if this might be contributing.  She elected to remain on the Effient - notes she was nervous about starting the brilinta anyway - and states this week her GI symptoms are resolved "99 percent". She informs me she would like to remain on the Effient for now but will give this a couple more weeks to make sure her home interventions for the GI upset are working. Advised patient this should be fine and that I would let provider know. Med list updated. Pt voiced thanks for the call.

## 2016-07-10 NOTE — Telephone Encounter (Signed)
Victoria Delgado is calling in reference to her  medication  change, Please call

## 2016-07-10 NOTE — Telephone Encounter (Signed)
Noted, chart still reflects Effient use.

## 2016-07-11 ENCOUNTER — Encounter (HOSPITAL_COMMUNITY): Payer: Medicare Other

## 2016-07-13 ENCOUNTER — Encounter (HOSPITAL_COMMUNITY): Payer: Medicare Other

## 2016-07-16 ENCOUNTER — Encounter (HOSPITAL_COMMUNITY): Payer: Medicare Other

## 2016-07-18 ENCOUNTER — Telehealth: Payer: Self-pay | Admitting: Cardiology

## 2016-07-18 ENCOUNTER — Encounter (HOSPITAL_COMMUNITY): Payer: Medicare Other

## 2016-07-18 NOTE — Telephone Encounter (Signed)
Patient sees Dr. Tresa Endo for OSA and she uses a CPAP machine - she sleeps about 4-4.5 hours, some nights better than others. She always uses her CPAP machine. Patient states her heart beats really hard and this wakes up her at night and when she wakes up, this goes away. She states this has been going on for several weeks. Patient states she does not feel like she is getting an air leak with her mask (nasal pillow).   Patient wonders if her CPAP settings are off? She states this happened previously.   She uses [facility] for DME Her machine downloads automatically - she states she can go online and view her reports and she states it has shown at the most 1.5 per hour. She states there is nothing on her online reports that in her opinion sticks out as a cause of these symptoms.   Will defer to MD & B. Dillard, CMA @ [employer] who works w/sleep studies/CPAP as Burna Mortimer, CMA is out of the office

## 2016-07-18 NOTE — Telephone Encounter (Signed)
New message   Pt said verbalized she done fell like she is getting enough sleep and after 3 or 4 hours her heart beats pretty hard

## 2016-07-19 NOTE — Telephone Encounter (Signed)
I have spoken with [facility] and they are going to check the patient's machine and call her.  I have asked that they call me back with they have more information.   I have also spoken with the patient, and she is aware that we are trying to get more information.   Once I get a call back from either [employer] or the patient (after they call her),  I will route it to the appropriate MD. Dr. Tresa Endo is the MD for her OSA, Hochrein is her regular Cardiologist.   Patient was grateful for the call, and expects to speak with me again once we hear back from [employer].  She has my direct office number to call me if she needs anything.

## 2016-07-20 ENCOUNTER — Encounter (HOSPITAL_COMMUNITY): Payer: Medicare Other

## 2016-07-20 NOTE — Telephone Encounter (Signed)
Spoke with [facility].  They have confirmed that patient's machine is working correctly, and that patient's download is good.  This is most likely not a sleep related issues, so [facility] has asked that I route this message to her Cardiologist.

## 2016-07-22 NOTE — Telephone Encounter (Signed)
May need a cardaic monitor but defer to Dr. Antoine Poche who is he primary cardiologist

## 2016-07-23 ENCOUNTER — Encounter (HOSPITAL_COMMUNITY): Payer: Medicare Other

## 2016-07-24 ENCOUNTER — Encounter (HOSPITAL_COMMUNITY)
Admission: RE | Admit: 2016-07-24 | Discharge: 2016-07-24 | Disposition: A | Discharge status: Home / Self Care | Payer: Medicare Other | Source: Ambulatory Visit | Attending: Cardiology | Admitting: Cardiology

## 2016-07-24 VITALS — BP 143/64 | HR 60 | Ht 68.0 in | Wt 289.0 lb

## 2016-07-24 DIAGNOSIS — Z955 Presence of coronary angioplasty implant and graft: Secondary | ICD-10-CM

## 2016-07-24 DIAGNOSIS — I214 Non-ST elevation (NSTEMI) myocardial infarction: Secondary | ICD-10-CM | POA: Diagnosis not present

## 2016-07-24 NOTE — Telephone Encounter (Signed)
Message routed to Dr. Antoine Poche & Tamela Oddi

## 2016-07-24 NOTE — Progress Notes (Signed)
Cardiac Rehab Medication Review by a Pharmacist  Does the patient feel that his/her medications are working for him/her?  yes  Has the patient been experiencing any side effects to the medications prescribed?  yes  Does the patient measure his/her own blood pressure or blood glucose at home?  yes   Does the patient have any problems obtaining medications due to transportation or finances?   No  Understanding of regimen: excellent Understanding of indications: excellent Potential of compliance: excellent    Pharmacist comments: Patient presents today for medication history. She reports good compliance using a pill box and understanding of medications. She does endorse some dizziness, heart palpitations, fatigue, and low heart rate (52) at times. Recommended keeping a log of her heart rates and to address them at her next doctor's appointment- may need to adjust beta blocker dose.   Mackie Pai, PharmD PGY1 Pharmacy Resident Pager: [phone] 07/24/2016 1:41 PM

## 2016-07-25 ENCOUNTER — Encounter (HOSPITAL_COMMUNITY): Payer: Medicare Other

## 2016-07-26 ENCOUNTER — Encounter: Payer: Self-pay | Admitting: Cardiology

## 2016-07-26 ENCOUNTER — Telehealth: Payer: Self-pay | Admitting: Cardiology

## 2016-07-26 NOTE — Progress Notes (Signed)
HPI The patient presents for follow up of medications.  She has atrial fibrillation which she ultimately thinks was related to a steroid injection. This was in 2008. At that time she was found to have asymmetric septal hypertrophy. This has been followed conservatively.  Echocardiogram did demonstrate concentric LV hypertrophy which was moderate and moderate LAE.  Since I saw her in May she was in the hospital with CAD.  She was admitted to [facility] on 5/24 after waking up with left arm pain .  She had positive troponin with  PCI to the mid LCX and 1st DX. She is on DAPT with aspirin/Effient. She had 2 drug-eluting stents placed in the circumflex leading into a marginal. This was apparently somewhat small circumflex. She also had a diagonal had a drug-eluting stent placed. She had a preserved ejection fraction. An echo in our office a well preserved ejection fraction. There was some moderate aortic stenosis. There was no mention of hypertrophic obstruction.   She called in his conversation with our staff and pharmacist regarding nausea and muscle aches. It was decided to stop her Effient and try Brilinta.  However, she called yesterday wanting to be added to my schedule to discuss this further.  She reports that she was very anxious about Brilinta.  She thought that her GI symptoms improved with stopping Zocor.  Since stopping that she has done much better.  The patient denies any new symptoms such as chest discomfort, neck or arm discomfort. There has been no new shortness of breath, PND or orthopnea. There have been no reported palpitations, presyncope or syncope.  Allergies  Allergen Reactions  . Atorvastatin Other (See Comments)    myalgias  . Codeine Nausea Only  . Kenalog [Triamcinolone Acetonide] Other (See Comments)    Had A.fib after she received the injection."Steroid meds"  . Relafen [Nabumetone] Other (See Comments)    Edema   . Penicillins Rash    40 years ago, injection site was  red and swollen.   Has patient had a PCN reaction causing immediate rash, facial/tongue/throat swelling, SOB or lightheadedness with hypotension: YES Has patient had a PCN reaction causing severe rash involving mucus membranes or skin necrosis: NO Has patient had a PCN reaction that required hospitalization. No  Has patient had a PCN reaction occurring within the last 10 years: NO If all of the above answers are "NO", then may proceed with Cephalosporin use.    . Tramadol Other (See Comments)    Auditory halllucinations    Current Outpatient Prescriptions  Medication Sig Dispense Refill  . acetaminophen (TYLENOL) 500 MG tablet Take 500 mg by mouth every 6 (six) hours as needed for mild pain.    Marland Kitchen aspirin EC 81 MG tablet Take 81 mg by mouth daily.    Marland Kitchen CARTIA XT 240 MG 24 hr capsule TAKE 1 CAPSULE(240 MG) BY MOUTH AT BEDTIME 90 capsule 3  . furosemide (LASIX) 40 MG tablet Take one daily with your potassium pill 90 tablet 3  . nitroGLYCERIN (NITROSTAT) 0.4 MG SL tablet Place 1 tablet (0.4 mg total) under the tongue every 5 (five) minutes as needed for chest pain. 25 tablet 3  . potassium chloride (K-DUR,KLOR-CON) 10 MEQ tablet Take 10 mEq by mouth daily.     . prasugrel (EFFIENT) 10 MG TABS tablet Take 1 tablet (10 mg total) by mouth daily. 90 tablet 3  . ranitidine (ZANTAC) 150 MG tablet Take 150 mg by mouth 2 (two) times daily.    Marland Kitchen  simvastatin (ZOCOR) 40 MG tablet Take 1 tablet (40 mg total) by mouth daily. 90 tablet 3  . TOPROL XL 50 MG 24 hr tablet TAKE 1 TABLET BY MOUTH EVERY MORNING AND 1/2 TABLET EVERY EVENING 45 tablet 10   No current facility-administered medications for this visit.     Past Medical History:  Diagnosis Date  . Arthritis    osteoarthritis-hips,knees, back, hands  . Asymmetric septal hypertrophy (HCC)   . Atrial fibrillation (HCC)   . Cataract    corrective surgery done  . Fatty liver   . Gallstones   . GERD (gastroesophageal reflux disease)   . Heart  palpitations   . Hepatitis    hepatitis A; past hx.,many yrs ago ? food source  . Hernia, hiatal   . Hyperlipidemia   . Hyperlipidemia   . Hypertension   . Obesity   . Sleep apnea    CPAP    Past Surgical History:  Procedure Laterality Date  . APPENDECTOMY  2004  . CATARACT EXTRACTION W/ INTRAOCULAR LENS IMPLANT     both eyes  . CHOLECYSTECTOMY    . NM MYOCAR PERF WALL MOTION  08/15/04   No ischemia  . RETINAL DETACHMENT SURGERY Bilateral    remains with slight hazy vision with lights  . TOTAL HIP ARTHROPLASTY Right 08/04/2014   Procedure: RIGHT TOTAL HIP ARTHROPLASTY ANTERIOR APPROACH;  Surgeon: Loanne DrillingFrank Aluisio V, MD;  Location: [facility];  Service: Orthopedics;  Laterality: Right;  . TOTAL HIP ARTHROPLASTY Left 01/05/2015   Procedure: LEFT TOTAL HIP ARTHROPLASTY ANTERIOR APPROACH;  Surgeon: Loanne DrillingFrank Aluisio V, MD;  Location: [facility];  Service: Orthopedics;  Laterality: Left;  . US ECHOCARDIOGRAPHY  06/25/2012   Moderate ASH,LV hyperdynamic,LA is mod. dilated,trace MR,TR,AI    ROS:   As stated in the HPI and negative for all other systems.  PHYSICAL EXAM There were no vitals taken for this visit. GENERAL:  Well appearing HEENT:  Pupils equal round and reactive, fundi not visualized, oral mucosa unremarkable NECK:  No jugular venous distention, waveform within normal limits, carotid upstroke brisk and symmetric, no bruits, no thyromegaly LYMPHATICS:  No cervical, inguinal adenopathy LUNGS:  Clear to auscultation bilaterally BACK:  No CVA tenderness CHEST:  Unremarkable HEART:  PMI not displaced or sustained,S1 and S2 within normal limits, no S3, no S4, no clicks, no rubs, 3/6 systolic murmur radiating out the aortic outflow tract and into the carotids not increasing with a strain phase of Valsalva, no diastolic murmurs ABD:  Flat, positive bowel sounds normal in frequency in pitch, no bruits, no rebound, no guarding, no midline pulsatile mass, no hepatomegaly, no splenomegaly, obese EXT:   2 plus pulses throughout, trace edema, no cyanosis no clubbing  Lab Results  Component Value Date   CHOL 158 07/03/2016   TRIG 185 (H) 07/03/2016   HDL 36 (L) 07/03/2016   LDLCALC 85 07/03/2016     ASSESSMENT AND PLAN  CAD:  She is starting cardiac rehab. She will otherwise continue the meds as listed.  Hypertrophic cardiomyopathy - I do not see evidence of this on the recent echo.  No change in therapy.   AS - She does have some moderate aortic stenosis.  I will follow this clinically.    Dyslipidemia - She will start Crestor after two weeks off of Zocor.  We will see if her GI symptoms improve.   Palpitations - She is rarely bothered by these. No change in therapy is indicated.   Hypertension  The  blood pressure is at target. No change in medications is indicated. We will continue with therapeutic lifestyle changes (TLC).

## 2016-07-26 NOTE — Telephone Encounter (Signed)
Spoke with pt, aware dr hochrein is out of the office until after lunch today. She is wanting to talk to him about changing from the effient to brilinta. She is nervous about the brilinta and does not want to make that change without talking to dr hochrein. Aware it will be late todqy before he will be able to return her call. Patient voiced understanding

## 2016-07-26 NOTE — Telephone Encounter (Signed)
Pt have appt tomorrow @ 2:00 pm

## 2016-07-26 NOTE — Telephone Encounter (Signed)
New message     Pt called wanting an appt with dr today or tomorrow about the change of her medication. She stated that she spoke with someone here about it but only wants to talk to the dr about it going forward. She insists that she see him today or tomorrow. I offered 10/2 with Hochrein and pt refused. I offered 9/7 @ 8 am with Barrett and pt refused. Please advise.

## 2016-07-27 ENCOUNTER — Encounter: Payer: Self-pay | Admitting: Cardiology

## 2016-07-27 ENCOUNTER — Encounter (HOSPITAL_COMMUNITY): Payer: Medicare Other

## 2016-07-27 ENCOUNTER — Ambulatory Visit (INDEPENDENT_AMBULATORY_CARE_PROVIDER_SITE_OTHER): Payer: Medicare Other | Admitting: Cardiology

## 2016-07-27 VITALS — BP 112/62 | HR 68 | Ht 68.0 in | Wt 290.0 lb

## 2016-07-27 DIAGNOSIS — I259 Chronic ischemic heart disease, unspecified: Secondary | ICD-10-CM | POA: Diagnosis not present

## 2016-07-27 DIAGNOSIS — R110 Nausea: Secondary | ICD-10-CM | POA: Diagnosis not present

## 2016-07-27 DIAGNOSIS — I422 Other hypertrophic cardiomyopathy: Secondary | ICD-10-CM

## 2016-07-27 MED ORDER — ROSUVASTATIN CALCIUM 20 MG PO TABS: 20.0000 mg | ORAL_TABLET | Freq: Every day | ORAL | 3 refills | Status: DC

## 2016-07-27 NOTE — Patient Instructions (Signed)
Your physician has recommended you make the following change in your medication:  1. START Crestor 20 mg - take 1 tablet by mouth once daily  Your physician recommends that you schedule a follow-up appointment in: 3 months with Dr Antoine PocheHochrein  If you need a refill on your cardiac medications before your next appointment, please call your pharmacy.

## 2016-07-27 NOTE — Progress Notes (Signed)
Cardiac Individual Treatment Plan  Patient Details  Name: Victoria Delgado MRN: [id number] Date of Birth: August 16, 1941 Referring Provider:   Flowsheet Row CARDIAC REHAB PHASE II ORIENTATION from 07/24/2016 in [facility] CARD[facility]AC [facility]  Referring Provider  Angelina Sheriff MD      Initial Encounter Date:  Flowsheet Row CARDIAC REHAB PHASE II ORIENTATION from 07/24/2016 in [facility] CARDIAC REHAB  Date  07/26/16  Referring Provider  Angelina Sheriff MD      Visit Diagnosis: NSTEMI (non-ST elevated myocardial infarction) [facility])  Stented coronary artery  Patient's Home Medications on Admission:  Current Outpatient Prescriptions:  .  acetaminophen (TYLENOL) 500 MG tablet, Take 500 mg by mouth every 6 (six) hours as needed for mild pain., Disp: , Rfl:  .  aspirin EC 81 MG tablet, Take 81 mg by mouth daily., Disp: , Rfl:  .  CARTIA XT 240 MG 24 hr capsule, TAKE 1 CAPSULE(240 MG) BY MOUTH AT BEDTIME, Disp: 90 capsule, Rfl: 3 .  furosemide (LASIX) 40 MG tablet, Take one daily with your potassium pill, Disp: 90 tablet, Rfl: 3 .  nitroGLYCERIN (NITROSTAT) 0.4 MG SL tablet, Place 1 tablet (0.4 mg total) under the tongue every 5 (five) minutes as needed for chest pain., Disp: 25 tablet, Rfl: 3 .  potassium chloride (K-DUR,KLOR-CON) 10 MEQ tablet, Take 10 mEq by mouth daily. , Disp: , Rfl:  .  prasugrel (EFFIENT) 10 MG TABS tablet, Take 1 tablet (10 mg total) by mouth daily., Disp: 90 tablet, Rfl: 3 .  ranitidine (ZANTAC) 150 MG tablet, Take 150 mg by mouth 2 (two) times daily., Disp: , Rfl:  .  simvastatin (ZOCOR) 40 MG tablet, Take 1 tablet (40 mg total) by mouth daily., Disp: 90 tablet, Rfl: 3 .  TOPROL XL 50 MG 24 hr tablet, TAKE 1 TABLET BY MOUTH EVERY MORNING AND 1/2 TABLET EVERY EVENING, Disp: 45 tablet, Rfl: 10  Past Medical History: Past Medical History:  Diagnosis Date  . Arthritis    osteoarthritis-hips,knees, back, hands  . Asymmetric septal hypertrophy  (HCC)   . Atrial fibrillation (HCC)   . Cataract    corrective surgery done  . Fatty liver   . Gallstones   . GERD (gastroesophageal reflux disease)   . Heart palpitations   . Hepatitis    hepatitis A; past hx.,many yrs ago ? food source  . Hernia, hiatal   . Hyperlipidemia   . Hyperlipidemia   . Hypertension   . Obesity   . Sleep apnea    CPAP    Tobacco Use: History  Smoking Status  . Former Smoker  Smokeless Tobacco  . Never Used    Comment: QUIT IN 1990    Labs: Recent Review Flowsheet Data    Labs for ITP Cardiac and Pulmonary Rehab Latest Ref Rng & Units 03/05/2013 04/20/2014 09/12/2015 02/24/2016 07/03/2016   Cholestrol 125 - 200 mg/dL 409(W) 119(J) 478 - 295   LDLCALC <130 mg/dL 621(H) 086(V) 784 - 85   HDL >=46 mg/dL 69(G) 29(B) 28(U) - 13(K)   Trlycerides <150 mg/dL 440(N) 027(O) 536(U) - 185(H)   TCO2 0 - 100 mmol/L - - - 24 -      Capillary Blood Glucose: No results found for: GLUCAP   Exercise Target Goals: Date: 07/26/16  Exercise Program Goal: Individual exercise prescription set with THRR, safety & activity barriers. Participant demonstrates ability to understand and report RPE using BORG scale, to self-measure pulse accurately, and to acknowledge the  importance of the exercise prescription.  Exercise Prescription Goal: Starting with aerobic activity 30 plus minutes a day, 3 days per week for initial exercise prescription. Provide home exercise prescription and guidelines that participant acknowledges understanding prior to discharge.  Activity Barriers & Risk Stratification:     Activity Barriers & Cardiac Risk Stratification - 07/24/16 1654      Activity Barriers & Cardiac Risk Stratification   Activity Barriers Back Problems;Arthritis;History of Falls   Cardiac Risk Stratification High      6 Minute Walk:     6 Minute Walk    Row Name 07/24/16 1722 07/26/16 1744 07/26/16 1752     6 Minute Walk   Phase  - Initial  -   Distance 963  feet  -  -   Walk Time 6 minutes  -  -   # of Rest Breaks  - 0  -   MPH  - 1.82  -   METS  - 0.9  -   RPE 12  -  -   VO2 Peak  - 3.2  -   Symptoms  - No Yes (comment)   Comments  -  - c/o chronic B hip pain during the walk test. Used a rolling walker for stability    Resting HR 61 bpm  -  -   Resting BP 143/64  -  -   Max Ex. HR 72 bpm  -  -   Max Ex. BP 122/70  -  -   2 Minute Post BP 102/60  -  -      Initial Exercise Prescription:     Initial Exercise Prescription - 07/26/16 1700      Date of Initial Exercise RX and Referring Provider   Date 07/26/16   Referring Provider Angelina SheriffHochrein, Jake MD     NuStep   Level 1   Minutes 30   METs 1.5     Prescription Details   Frequency (times per week) 3   Duration Progress to 30 minutes of continuous aerobic without signs/symptoms of physical distress     Intensity   THRR 40-80% of Max Heartrate 66-118   Ratings of Perceived Exertion 11-13   Perceived Dyspnea 0-4     Progression   Progression Continue to progress workloads to maintain intensity without signs/symptoms of physical distress.     Resistance Training   Training Prescription Yes   Weight 1lb   Reps 10-12      Perform Capillary Blood Glucose checks as needed.  Exercise Prescription Changes:   Exercise Comments:   Discharge Exercise Prescription (Final Exercise Prescription Changes):   Nutrition:  Target Goals: Understanding of nutrition guidelines, daily intake of sodium 1500mg , cholesterol 200mg , calories 30% from fat and 7% or less from saturated fats, daily to have 5 or more servings of fruits and vegetables.  Biometrics:    Nutrition Therapy Plan and Nutrition Goals:   Nutrition Discharge: Nutrition Scores:   Nutrition Goals Re-Evaluation:   Psychosocial: Target Goals: Acknowledge presence or absence of depression, maximize coping skills, provide positive support system. Participant is able to verbalize types and ability to use  techniques and skills needed for reducing stress and depression.  Initial Review & Psychosocial Screening:     Initial Psych Review & Screening - 07/24/16 1703      Initial Review   Current issues with Current Stress Concerns   Source of Stress Concerns Chronic Illness;Unable to participate in former interests or hobbies  Family Dynamics   Good Support System? Yes     Barriers   Psychosocial barriers to participate in program There are no identifiable barriers or psychosocial needs.     Screening Interventions   Interventions Encouraged to exercise      Quality of Life Scores:     Quality of Life - 07/26/16 1751      Quality of Life Scores   Health/Function Pre 22.5 %   Socioeconomic Pre 26.07 %   Psych/Spiritual Pre 25.5 %   Family Pre 30 %   GLOBAL Pre 24.96 %      PHQ-9: Recent Review Flowsheet Data    Depression screen [facility] 2/9 04/20/2014 03/05/2013   Decreased Interest 0 0   Down, Depressed, Hopeless 0 1   PHQ - 2 Score 0 1      Psychosocial Evaluation and Intervention:     Psychosocial Evaluation - 07/24/16 1703      Psychosocial Evaluation & Interventions   Interventions Stress management education;Relaxation education;Encouraged to exercise with the program and follow exercise prescription   Continued Psychosocial Services Needed Yes  health related stress management      Psychosocial Re-Evaluation:   Vocational Rehabilitation: Provide vocational rehab assistance to qualifying candidates.   Vocational Rehab Evaluation & Intervention:     Vocational Rehab - 07/24/16 1655      Initial Vocational Rehab Evaluation & Intervention   Assessment shows need for Vocational Rehabilitation No      Education: Education Goals: Education classes will be provided on a weekly basis, covering required topics. Participant will state understanding/return demonstration of topics presented.  Learning Barriers/Preferences:     Learning  Barriers/Preferences - 07/24/16 1226      Learning Barriers/Preferences   Learning Barriers Sight;Hearing   Learning Preferences Written Material      Education Topics: Count Your Pulse:  -Group instruction provided by verbal instruction, demonstration, patient participation and written materials to support subject.  Instructors address importance of being able to find your pulse and how to count your pulse when at home without a heart monitor.  Patients get hands on experience counting their pulse with staff help and individually.   Heart Attack, Angina, and Risk Factor Modification:  -Group instruction provided by verbal instruction, video, and written materials to support subject.  Instructors address signs and symptoms of angina and heart attacks.    Also discuss risk factors for heart disease and how to make changes to improve heart health risk factors.   Functional Fitness:  -Group instruction provided by verbal instruction, demonstration, patient participation, and written materials to support subject.  Instructors address safety measures for doing things around the house.  Discuss how to get up and down off the floor, how to pick things up properly, how to safely get out of a chair without assistance, and balance training.   Meditation and Mindfulness:  -Group instruction provided by verbal instruction, patient participation, and written materials to support subject.  Instructor addresses importance of mindfulness and meditation practice to help reduce stress and improve awareness.  Instructor also leads participants through a meditation exercise.    Stretching for Flexibility and Mobility:  -Group instruction provided by verbal instruction, patient participation, and written materials to support subject.  Instructors lead participants through series of stretches that are designed to increase flexibility thus improving mobility.  These stretches are additional exercise for major  muscle groups that are typically performed during regular warm up and cool down.   Hands Only CPR Anytime:  -  Group instruction provided by verbal instruction, video, patient participation and written materials to support subject.  Instructors co-teach with AHA video for hands only CPR.  Participants get hands on experience with mannequins.   Nutrition I class: Heart Healthy Eating:  -Group instruction provided by PowerPoint slides, verbal discussion, and written materials to support subject matter. The instructor gives an explanation and review of the Therapeutic Lifestyle Changes diet recommendations, which includes a discussion on lipid goals, dietary fat, sodium, fiber, plant stanol/sterol esters, sugar, and the components of a well-balanced, healthy diet.   Nutrition II class: Lifestyle Skills:  -Group instruction provided by PowerPoint slides, verbal discussion, and written materials to support subject matter. The instructor gives an explanation and review of label reading, grocery shopping for heart health, heart healthy recipe modifications, and ways to make healthier choices when eating out.   Diabetes Question & Answer:  -Group instruction provided by PowerPoint slides, verbal discussion, and written materials to support subject matter. The instructor gives an explanation and review of diabetes co-morbidities, pre- and post-prandial blood glucose goals, pre-exercise blood glucose goals, signs, symptoms, and treatment of hypoglycemia and hyperglycemia, and foot care basics.   Diabetes Blitz:  -Group instruction provided by PowerPoint slides, verbal discussion, and written materials to support subject matter. The instructor gives an explanation and review of the physiology behind type 1 and type 2 diabetes, diabetes medications and rational behind using different medications, pre- and post-prandial blood glucose recommendations and Hemoglobin A1c goals, diabetes diet, and exercise  including blood glucose guidelines for exercising safely.    Portion Distortion:  -Group instruction provided by PowerPoint slides, verbal discussion, written materials, and food models to support subject matter. The instructor gives an explanation of serving size versus portion size, changes in portions sizes over the last 20 years, and what consists of a serving from each food group.   Stress Management:  -Group instruction provided by verbal instruction, video, and written materials to support subject matter.  Instructors review role of stress in heart disease and how to cope with stress positively.     Exercising on Your Own:  -Group instruction provided by verbal instruction, power point, and written materials to support subject.  Instructors discuss benefits of exercise, components of exercise, frequency and intensity of exercise, and end points for exercise.  Also discuss use of nitroglycerin and activating EMS.  Review options of places to exercise outside of rehab.  Review guidelines for sex with heart disease.   Cardiac Drugs I:  -Group instruction provided by verbal instruction and written materials to support subject.  Instructor reviews cardiac drug classes: antiplatelets, anticoagulants, beta blockers, and statins.  Instructor discusses reasons, side effects, and lifestyle considerations for each drug class.   Cardiac Drugs II:  -Group instruction provided by verbal instruction and written materials to support subject.  Instructor reviews cardiac drug classes: angiotensin converting enzyme inhibitors (ACE-I), angiotensin II receptor blockers (ARBs), nitrates, and calcium channel blockers.  Instructor discusses reasons, side effects, and lifestyle considerations for each drug class.   Anatomy and Physiology of the Circulatory System:  -Group instruction provided by verbal instruction, video, and written materials to support subject.  Reviews functional anatomy of heart, how it  relates to various diagnoses, and what role the heart plays in the overall system.   Knowledge Questionnaire Score:     Knowledge Questionnaire Score - 07/26/16 1748      Knowledge Questionnaire Score   Pre Score 23/24      Core Components/Risk Factors/Patient  Goals at Admission:     Personal Goals and Risk Factors at Admission - 07/24/16 1707      Core Components/Risk Factors/Patient Goals on Admission   Hypertension Yes   Intervention Provide education on lifestyle modifcations including regular physical activity/exercise, weight management, moderate sodium restriction and increased consumption of fresh fruit, vegetables, and low fat dairy, alcohol moderation, and smoking cessation.;Monitor prescription use compliance.   Expected Outcomes Short Term: Continued assessment and intervention until BP is < 140/78mm HG in hypertensive participants. < 130/50mm HG in hypertensive participants with diabetes, heart failure or chronic kidney disease.;Long Term: Maintenance of blood pressure at goal levels.   Lipids Yes   Intervention Provide education and support for participant on nutrition & aerobic/resistive exercise along with prescribed medications to achieve LDL 70mg , HDL >40mg .   Expected Outcomes Short Term: Participant states understanding of desired cholesterol values and is compliant with medications prescribed. Participant is following exercise prescription and nutrition guidelines.;Long Term: Cholesterol controlled with medications as prescribed, with individualized exercise RX and with personalized nutrition plan. Value goals: LDL < 70mg , HDL > 40 mg.      Core Components/Risk Factors/Patient Goals Review:    Core Components/Risk Factors/Patient Goals at Discharge (Final Review):    ITP Comments:     ITP Comments    Row Name 07/24/16 1221           ITP Comments Dr. Armanda Magic, Medical Director           Comments: Patient attended orientation from 1330 to 1530 to  review rules and guidelines for program. Completed 6 minute walk test, Intitial ITP, and exercise prescription.  VSS. Telemetry-sinus rhythm  Asymptomatic.

## 2016-07-30 ENCOUNTER — Encounter (HOSPITAL_COMMUNITY)
Admission: RE | Admit: 2016-07-30 | Discharge: 2016-07-30 | Disposition: A | Discharge status: Home / Self Care | Payer: Medicare Other | Source: Ambulatory Visit | Attending: Cardiology | Admitting: Cardiology

## 2016-07-30 ENCOUNTER — Encounter (HOSPITAL_COMMUNITY): Payer: Self-pay

## 2016-07-30 DIAGNOSIS — I214 Non-ST elevation (NSTEMI) myocardial infarction: Secondary | ICD-10-CM | POA: Diagnosis not present

## 2016-07-30 DIAGNOSIS — Z955 Presence of coronary angioplasty implant and graft: Secondary | ICD-10-CM

## 2016-07-30 NOTE — Progress Notes (Signed)
Daily Session Note  Patient Details  Name: Victoria Delgado MRN: [id number] Date of Birth: [birth year] Referring Provider:   Flowsheet Row CARDIAC REHAB PHASE II ORIENTATION from 07/24/2016 in [place]  Referring Provider  Marijo File MD      Encounter Date: 07/30/2016  Check In:     Session Check In - 07/30/16 1417      Check-In   Location [facility]   Staff Present Luetta Nutting Fair, MS, ACSM RCEP, Exercise Physiologist;Maria Arenac, RN, BSN;Aarin Sparkman, RN, BSN   Supervising physician immediately available to respond to emergencies [facility] immediately available   Physician(s) Dr. Marily Memos   Medication changes reported     No   Fall or balance concerns reported    No   Warm-up and Cool-down Performed as group-led instruction   Resistance Training Performed Yes   VAD Patient? No     Pain Assessment   Currently in Pain? No/denies   Multiple Pain Sites No      Capillary Blood Glucose: No results found for this or any previous visit (from the past 24 hour(s)).   Goals Met:  Exercise tolerated well  Goals Unmet:  Not Applicable  Comments: Pt started cardiac rehab today.  Pt tolerated light exercise without difficulty. VSS, telemetry-sinus rhythm, asymptomatic.  Medication list reconciled. Pt denies barriers to medicaiton compliance.  PSYCHOSOCIAL ASSESSMENT:  PHQ-1.  Pt does admit to health related depressive symptoms occasionally.   Pt feels this is mostly due to her decreased mobility and physical limitations from her illness.  In addition to heart disease, pt has had bilateral hip replacements over past 2 years. Pt exhibits positive coping skills, hopeful outlook with supportive family.  Pt would benefit from stress reduction/relaxation education in addition to exercise to decrease these symptoms. Pt admits she is looking forward to support of group setting. Otherwise No psychosocial needs identified at this time, no  psychosocial interventions necessary.    Pt enjoys spending time with her family, traveling, her [place] location [place]. Pt goals for cardiac rehab are to lose weight and be able to live life without fear of heart disease. Pt oriented to exercise equipment and routine.    Understanding verbalized.    Dr. Fransico Him is Medical Director for Cardiac Rehab at [facility].

## 2016-08-01 ENCOUNTER — Encounter (HOSPITAL_COMMUNITY)
Admission: RE | Admit: 2016-08-01 | Discharge: 2016-08-01 | Disposition: A | Discharge status: Home / Self Care | Payer: Medicare Other | Source: Ambulatory Visit | Attending: Cardiology | Admitting: Cardiology

## 2016-08-01 DIAGNOSIS — I214 Non-ST elevation (NSTEMI) myocardial infarction: Secondary | ICD-10-CM

## 2016-08-01 DIAGNOSIS — Z955 Presence of coronary angioplasty implant and graft: Secondary | ICD-10-CM

## 2016-08-03 ENCOUNTER — Encounter (HOSPITAL_COMMUNITY)
Admission: RE | Admit: 2016-08-03 | Discharge: 2016-08-03 | Disposition: A | Discharge status: Home / Self Care | Payer: Medicare Other | Source: Ambulatory Visit | Attending: Cardiology | Admitting: Cardiology

## 2016-08-03 DIAGNOSIS — I214 Non-ST elevation (NSTEMI) myocardial infarction: Secondary | ICD-10-CM | POA: Diagnosis present

## 2016-08-03 DIAGNOSIS — Z955 Presence of coronary angioplasty implant and graft: Secondary | ICD-10-CM

## 2016-08-05 ENCOUNTER — Other Ambulatory Visit: Payer: Self-pay | Admitting: Cardiology

## 2016-08-07 NOTE — Telephone Encounter (Signed)
Rx(s) sent to pharmacy electronically.  

## 2016-08-08 ENCOUNTER — Encounter (HOSPITAL_COMMUNITY)
Admission: RE | Admit: 2016-08-08 | Discharge: 2016-08-08 | Disposition: A | Discharge status: Home / Self Care | Payer: Medicare Other | Source: Ambulatory Visit | Attending: Cardiology | Admitting: Cardiology

## 2016-08-08 DIAGNOSIS — I214 Non-ST elevation (NSTEMI) myocardial infarction: Secondary | ICD-10-CM | POA: Diagnosis not present

## 2016-08-08 DIAGNOSIS — Z955 Presence of coronary angioplasty implant and graft: Secondary | ICD-10-CM

## 2016-08-10 ENCOUNTER — Encounter (HOSPITAL_COMMUNITY)
Admission: RE | Admit: 2016-08-10 | Discharge: 2016-08-10 | Disposition: A | Discharge status: Home / Self Care | Payer: Medicare Other | Source: Ambulatory Visit | Attending: Cardiology | Admitting: Cardiology

## 2016-08-10 DIAGNOSIS — I214 Non-ST elevation (NSTEMI) myocardial infarction: Secondary | ICD-10-CM

## 2016-08-10 DIAGNOSIS — Z955 Presence of coronary angioplasty implant and graft: Secondary | ICD-10-CM

## 2016-08-13 ENCOUNTER — Encounter (HOSPITAL_COMMUNITY)
Admission: RE | Admit: 2016-08-13 | Discharge: 2016-08-13 | Disposition: A | Discharge status: Home / Self Care | Payer: Medicare Other | Source: Ambulatory Visit | Attending: Cardiology | Admitting: Cardiology

## 2016-08-13 DIAGNOSIS — I214 Non-ST elevation (NSTEMI) myocardial infarction: Secondary | ICD-10-CM

## 2016-08-13 DIAGNOSIS — Z955 Presence of coronary angioplasty implant and graft: Secondary | ICD-10-CM

## 2016-08-13 NOTE — Progress Notes (Addendum)
Reviewed home exercise with pt today.  Pt plans to walk, use stationary bike, and do hip strengthening exercises, 2-3x/week in addition to coming to cardiac rehab  Reviewed THR, pulse, RPE, sign and symptoms, NTG use, and when to call 911 or MD.  Also discussed weather considerations and indoor options.  Pt voiced understanding.    [employer]

## 2016-08-15 ENCOUNTER — Encounter (HOSPITAL_COMMUNITY): Payer: Medicare Other

## 2016-08-17 ENCOUNTER — Encounter (HOSPITAL_COMMUNITY): Payer: Medicare Other

## 2016-08-17 NOTE — Progress Notes (Signed)
Cardiac Individual Treatment Plan  Patient Details  Name: Victoria Delgado MRN: [id number] Date of Birth: 01/16/1941 Referring Provider:   Flowsheet Row CARDIAC REHAB PHASE II ORIENTATION [facility]  Referring Provider  Angelina SheriffHochrein, Jake MD      Initial Encounter Date:  Flowsheet Row CARDIAC REHAB PHASE II ORIENTATION [facility]2016 [facility]  Date  07/26/16  Referring Provider  Angelina SheriffHochrein, Jake MD      Visit Diagnosis: NSTEMI (non-ST elevated myocardial infarction) [facility](HCC)  Stented coronary artery  Patient's Home Medications on Admission:  Current Outpatient Prescriptions:  .  acetaminophen (TYLENOL) 500 MG tablet, Take 500 mg by mouth every 6 (six) hours as needed for mild pain., Disp: , Rfl:  .  aspirin EC 81 MG tablet, Take 81 mg by mouth daily., Disp: , Rfl:  .  CARTIA XT 240 MG 24 hr capsule, TAKE 1 CAPSULE(240 MG) BY MOUTH AT BEDTIME, Disp: 90 capsule, Rfl: 3 .  furosemide (LASIX) 40 MG tablet, Take one daily with your potassium pill, Disp: 90 tablet, Rfl: 3 .  nitroGLYCERIN (NITROSTAT) 0.4 MG SL tablet, Place 1 tablet (0.4 mg total) under the tongue every 5 (five) minutes as needed for chest pain., Disp: 25 tablet, Rfl: 3 .  potassium chloride (K-DUR,KLOR-CON) 10 MEQ tablet, Take 10 mEq by mouth daily. , Disp: , Rfl:  .  prasugrel (EFFIENT) 10 MG TABS tablet, Take 1 tablet (10 mg total) by mouth daily., Disp: 90 tablet, Rfl: 3 .  ranitidine (ZANTAC) 150 MG tablet, Take 150 mg by mouth 2 (two) times daily., Disp: , Rfl:  .  rosuvastatin (CRESTOR) 20 MG tablet, Take 1 tablet (20 mg total) by mouth daily., Disp: 90 tablet, Rfl: 3 .  simvastatin (ZOCOR) 40 MG tablet, Take 1 tablet (40 mg total) by mouth daily., Disp: 90 tablet, Rfl: 3 .  TOPROL XL 50 MG 24 hr tablet, TAKE 1 TABLET BY MOUTH EVERY MORNING AND 1/2 TABLET EVERY EVENING, Disp: 135 tablet, Rfl: 2  Past Medical History: Past Medical  History:  Diagnosis Date  . Arthritis    osteoarthritis-hips,knees, back, hands  . Asymmetric septal hypertrophy (HCC)   . Atrial fibrillation (HCC)   . CAD (coronary artery disease)    2 drug-eluting stents placed in the circumflex leading into a marginal.  [place] [place]  . Cataract    corrective surgery done  . Fatty liver   . Gallstones   . GERD (gastroesophageal reflux disease)   . Heart palpitations   . Hepatitis    hepatitis A; past hx.,many yrs ago ? food source  . Hernia, hiatal   . Hyperlipidemia   . Hyperlipidemia   . Hypertension   . Obesity   . Sleep apnea    CPAP    Tobacco Use: History  Smoking Status  . Former Smoker  Smokeless Tobacco  . Never Used    Comment: QUIT IN 1990    Labs: Recent Review Flowsheet Data    Labs for ITP Cardiac and Pulmonary Rehab Latest Ref Rng & Units 03/05/2013 04/20/2014 09/12/2015 02/24/2016 07/03/2016   Cholestrol 125 - 200 mg/dL 409(W261(H) 119(J262(H) 478199 - 295158   LDLCALC <130 mg/dL 621(H177(H) 086(V181(H) 784105 - 85   HDL >=46 mg/dL 69(G34(L) 29(B36(L) 28(U29(L) - 13(K36(L)   Trlycerides <150 mg/dL 440(N252(H) 027(O227(H) 536(U323(H) - 185(H)   TCO2 0 - 100 mmol/L - - - 24 -      Capillary  Blood Glucose: No results found for: GLUCAP   Exercise Target Goals:    Exercise Program Goal: Individual exercise prescription set with THRR, safety & activity barriers. Participant demonstrates ability to understand and report RPE using BORG scale, to self-measure pulse accurately, and to acknowledge the importance of the exercise prescription.  Exercise Prescription Goal: Starting with aerobic activity 30 plus minutes a day, 3 days per week for initial exercise prescription. Provide home exercise prescription and guidelines that participant acknowledges understanding prior to discharge.  Activity Barriers & Risk Stratification:     Activity Barriers & Cardiac Risk Stratification - 07/24/16 1654      Activity Barriers & Cardiac Risk Stratification   Activity Barriers  Back Problems;Arthritis;History of Falls   Cardiac Risk Stratification High      6 Minute Walk:     6 Minute Walk    Row Name 07/24/16 1722 07/26/16 1744 07/26/16 1752     6 Minute Walk   Phase  - Initial  -   Distance 963 feet  -  -   Walk Time 6 minutes  -  -   # of Rest Breaks  - 0  -   MPH  - 1.82  -   METS  - 0.9  -   RPE 12  -  -   VO2 Peak  - 3.2  -   Symptoms  - No Yes (comment)   Comments  -  - c/o chronic B hip pain during the walk test. Used a rolling walker for stability    Resting HR 61 bpm  -  -   Resting BP 143/64  -  -   Max Ex. HR 72 bpm  -  -   Max Ex. BP 122/70  -  -   2 Minute Post BP 102/60  -  -      Initial Exercise Prescription:     Initial Exercise Prescription - 07/26/16 1700      Date of Initial Exercise RX and Referring Provider   Date 07/26/16   Referring Provider Angelina Sheriff MD     NuStep   Level 1   Minutes 30   METs 1.5     Prescription Details   Frequency (times per week) 3   Duration Progress to 30 minutes of continuous aerobic without signs/symptoms of physical distress     Intensity   THRR 40-80% of Max Heartrate 66-118   Ratings of Perceived Exertion 11-13   Perceived Dyspnea 0-4     Progression   Progression Continue to progress workloads to maintain intensity without signs/symptoms of physical distress.     Resistance Training   Training Prescription Yes   Weight 1lb   Reps 10-12      Perform Capillary Blood Glucose checks as needed.  Exercise Prescription Changes:     Exercise Prescription Changes    Row Name 08/13/16 1600             Exercise Review   Progression Yes         Response to Exercise   Blood Pressure (Admit) 124/72       Blood Pressure (Exercise) 132/70       Blood Pressure (Exit) 106/70       Heart Rate (Admit) 59 bpm       Heart Rate (Exercise) 80 bpm       Heart Rate (Exit) 53 bpm       Rating of Perceived Exertion (Exercise) 12  Comments Reviewed HEP on 08/13/16        Duration Progress to 30 minutes of continuous aerobic without signs/symptoms of physical distress       Intensity THRR unchanged         Progression   Average METs 1.7         Resistance Training   Training Prescription Yes       Weight 2lbs       Reps 10-12         NuStep   Level 1       Minutes 30       METs 1.7         Home Exercise Plan   Plans to continue exercise at Home  Reviewed HEP on 08/13/16. See progress note       Frequency Add 2 additional days to program exercise sessions.          Exercise Comments:     Exercise Comments    Row Name 08/13/16 1632           Exercise Comments Reviewed METs and goals. Pt is tolerating exercise well; will continue to monitor exercise progression.          Discharge Exercise Prescription (Final Exercise Prescription Changes):     Exercise Prescription Changes - 08/13/16 1600      Exercise Review   Progression Yes     Response to Exercise   Blood Pressure (Admit) 124/72   Blood Pressure (Exercise) 132/70   Blood Pressure (Exit) 106/70   Heart Rate (Admit) 59 bpm   Heart Rate (Exercise) 80 bpm   Heart Rate (Exit) 53 bpm   Rating of Perceived Exertion (Exercise) 12   Comments Reviewed HEP on 08/13/16   Duration Progress to 30 minutes of continuous aerobic without signs/symptoms of physical distress   Intensity THRR unchanged     Progression   Average METs 1.7     Resistance Training   Training Prescription Yes   Weight 2lbs   Reps 10-12     NuStep   Level 1   Minutes 30   METs 1.7     Home Exercise Plan   Plans to continue exercise at Home  Reviewed HEP on 08/13/16. See progress note   Frequency Add 2 additional days to program exercise sessions.      Nutrition:  Target Goals: Understanding of nutrition guidelines, daily intake of sodium 1500mg , cholesterol 200mg , calories 30% from fat and 7% or less from saturated fats, daily to have 5 or more servings of fruits and  vegetables.  Biometrics:    Nutrition Therapy Plan and Nutrition Goals:   Nutrition Discharge: Nutrition Scores:   Nutrition Goals Re-Evaluation:   Psychosocial: Target Goals: Acknowledge presence or absence of depression, maximize coping skills, provide positive support system. Participant is able to verbalize types and ability to use techniques and skills needed for reducing stress and depression.  Initial Review & Psychosocial Screening:     Initial Psych Review & Screening - 07/24/16 1703      Initial Review   Current issues with Current Stress Concerns   Source of Stress Concerns Chronic Illness;Unable to participate in former interests or hobbies     Family Dynamics   Good Support System? Yes     Barriers   Psychosocial barriers to participate in program There are no identifiable barriers or psychosocial needs.     Screening Interventions   Interventions Encouraged to exercise      Quality  of Life Scores:     Quality of Life - 07/26/16 1751      Quality of Life Scores   Health/Function Pre 22.5 %   Socioeconomic Pre 26.07 %   Psych/Spiritual Pre 25.5 %   Family Pre 30 %   GLOBAL Pre 24.96 %      PHQ-9: Recent Review Flowsheet Data    [facility] 07/30/2016 04/20/2014 03/05/2013   Decreased Interest - 0 0   Down, Depressed, Hopeless 1  0 1   PHQ - 2 Score 1 0 1      Psychosocial Evaluation and Intervention:     Psychosocial Evaluation - 08/17/16 1654      Psychosocial Evaluation & Interventions   Continued Psychosocial Services Needed --      Psychosocial Re-Evaluation:   Vocational Rehabilitation: Provide vocational rehab assistance to qualifying candidates.   Vocational Rehab Evaluation & Intervention:     Vocational Rehab - 07/24/16 1655      Initial Vocational Rehab Evaluation & Intervention   Assessment shows need for Vocational Rehabilitation No      Education: Education Goals: Education classes will be  provided on a weekly basis, covering required topics. Participant will state understanding/return demonstration of topics presented.  Learning Barriers/Preferences:     Learning Barriers/Preferences - 07/24/16 1226      Learning Barriers/Preferences   Learning Barriers Sight;Hearing   Learning Preferences Written Material      Education Topics: Count Your Pulse:  -Group instruction provided by verbal instruction, demonstration, patient participation and written materials to support subject.  Instructors address importance of being able to find your pulse and how to count your pulse when at home without a heart monitor.  Patients get hands on experience counting their pulse with staff help and individually. Flowsheet Row CARDIAC REHAB PHASE II EXERC[facility]  Date  08/03/16  Educator  Gladstone LighterMaria Whitaker, RN  Instruction Review Code  2- meets goals/outcomes      Heart Attack, Angina, and Risk Factor Modification:  -Group instruction provided by verbal instruction, video, and written materials to support subject.  Instructors address signs and symptoms of angina and heart attacks.    Also discuss risk factors for heart disease and how to make changes to improve heart health risk factors.   Functional Fitness:  -Group instruction provided by verbal instruction, demonstration, patient participation, and written materials to support subject.  Instructors address safety measures for doing things around the house.  Discuss how to get up and down off the floor, how to pick things up properly, how to safely get out of a chair without assistance, and balance training.   Meditation and Mindfulness:  -Group instruction provided by verbal instruction, patient participation, and written materials to support subject.  Instructor addresses importance of mindfulness and meditation practice to help reduce stress and improve awareness.  Instructor also  leads participants through a meditation exercise.    Stretching for Flexibility and Mobility:  -Group instruction provided by verbal instruction, patient participation, and written materials to support subject.  Instructors lead participants through series of stretches that are designed to increase flexibility thus improving mobility.  These stretches are additional exercise for major muscle groups that are typically performed during regular warm up and cool down.   Hands Only CPR Anytime:  -Group instruction provided by verbal instruction, video, patient participation and written materials to support subject.  Instructors co-teach with AHA video for hands only CPR.  Participants get hands on experience with mannequins.   Nutrition I class: Heart Healthy Eating:  -Group instruction provided by PowerPoint slides, verbal discussion, and written materials to support subject matter. The instructor gives an explanation and review of the Therapeutic Lifestyle Changes diet recommendations, which includes a discussion on lipid goals, dietary fat, sodium, fiber, plant stanol/sterol esters, sugar, and the components of a well-balanced, healthy diet.   Nutrition II class: Lifestyle Skills:  -Group instruction provided by PowerPoint slides, verbal discussion, and written materials to support subject matter. The instructor gives an explanation and review of label reading, grocery shopping for heart health, heart healthy recipe modifications, and ways to make healthier choices when eating out.   Diabetes Question & Answer:  -Group instruction provided by PowerPoint slides, verbal discussion, and written materials to support subject matter. The instructor gives an explanation and review of diabetes co-morbidities, pre- and post-prandial blood glucose goals, pre-exercise blood glucose goals, signs, symptoms, and treatment of hypoglycemia and hyperglycemia, and foot care basics.   Diabetes Blitz:  -Group  instruction provided by PowerPoint slides, verbal discussion, and written materials to support subject matter. The instructor gives an explanation and review of the physiology behind type 1 and type 2 diabetes, diabetes medications and rational behind using different medications, pre- and post-prandial blood glucose recommendations and Hemoglobin A1c goals, diabetes diet, and exercise including blood glucose guidelines for exercising safely.    Portion Distortion:  -Group instruction provided by PowerPoint slides, verbal discussion, written materials, and food models to support subject matter. The instructor gives an explanation of serving size versus portion size, changes in portions sizes over the last 20 years, and what consists of a serving from each food group.   Stress Management:  -Group instruction provided by verbal instruction, video, and written materials to support subject matter.  Instructors review role of stress in heart disease and how to cope with stress positively.     Exercising on Your Own:  -Group instruction provided by verbal instruction, power point, and written materials to support subject.  Instructors discuss benefits of exercise, components of exercise, frequency and intensity of exercise, and end points for exercise.  Also discuss use of nitroglycerin and activating EMS.  Review options of places to exercise outside of rehab.  Review guidelines for sex with heart disease.   Cardiac Drugs I:  -Group instruction provided by verbal instruction and written materials to support subject.  Instructor reviews cardiac drug classes: antiplatelets, anticoagulants, beta blockers, and statins.  Instructor discusses reasons, side effects, and lifestyle considerations for each drug class.   Cardiac Drugs II:  -Group instruction provided by verbal instruction and written materials to support subject.  Instructor reviews cardiac drug classes: angiotensin converting enzyme inhibitors  (ACE-I), angiotensin II receptor blockers (ARBs), nitrates, and calcium channel blockers.  Instructor discusses reasons, side effects, and lifestyle considerations for each drug class.   Anatomy and Physiology of the Circulatory System:  -Group instruction provided by verbal instruction, video, and written materials to support subject.  Reviews functional anatomy of heart, how it relates to various diagnoses, and what role the heart plays in the overall system. Flowsheet Row CARDIAC REHAB PHASE II EXERCISE from 08/08/2016 in [facility]IAC REHAB  Date  08/08/16  Instruction Review Code  2- meets goals/outcomes      Knowledge Questionnaire Score:     Knowledge Questionnaire Score - 07/26/16 1748      Knowledge Questionnaire Score   Pre Score 23/24  Core Components/Risk Factors/Patient Goals at Admission:     Personal Goals and Risk Factors at Admission - 07/24/16 1707      Core Components/Risk Factors/Patient Goals on Admission   Hypertension Yes   Intervention Provide education on lifestyle modifcations including regular physical activity/exercise, weight management, moderate sodium restriction and increased consumption of fresh fruit, vegetables, and low fat dairy, alcohol moderation, and smoking cessation.;Monitor prescription use compliance.   Expected Outcomes Short Term: Continued assessment and intervention until BP is < 140/72mm HG in hypertensive participants. < 130/43mm HG in hypertensive participants with diabetes, heart failure or chronic kidney disease.;Long Term: Maintenance of blood pressure at goal levels.   Lipids Yes   Intervention Provide education and support for participant on nutrition & aerobic/resistive exercise along with prescribed medications to achieve LDL 70mg , HDL >40mg .   Expected Outcomes Short Term: Participant states understanding of desired cholesterol values and is compliant with medications prescribed. Participant is  following exercise prescription and nutrition guidelines.;Long Term: Cholesterol controlled with medications as prescribed, with individualized exercise RX and with personalized nutrition plan. Value goals: LDL < 70mg , HDL > 40 mg.      Core Components/Risk Factors/Patient Goals Review:      Goals and Risk Factor Review    Row Name 08/13/16 1632             Core Components/Risk Factors/Patient Goals Review   Personal Goals Review Other;Increase Strength and Stamina       Review Discussed home exercise to assist with improving cardiovascular fitness.        Expected Outcomes Pt will exercise 2-3x/week at home           Core Components/Risk Factors/Patient Goals at Discharge (Final Review):      Goals and Risk Factor Review - 08/13/16 1632      Core Components/Risk Factors/Patient Goals Review   Personal Goals Review Other;Increase Strength and Stamina   Review Discussed home exercise to assist with improving cardiovascular fitness.    Expected Outcomes Pt will exercise 2-3x/week at home       ITP Comments:     ITP Comments    Row Name 07/24/16 1221           ITP Comments Dr. Armanda Magic, Medical Director           Comments: Pt is making expected progress toward personal goals after completing  7 sessions. Recommend continued exercise and life style modification education including  stress management and relaxation techniques to decrease cardiac risk profile.

## 2016-08-20 ENCOUNTER — Encounter (HOSPITAL_COMMUNITY)
Admission: RE | Admit: 2016-08-20 | Discharge: 2016-08-20 | Disposition: A | Discharge status: Home / Self Care | Payer: Medicare Other | Source: Ambulatory Visit | Attending: Cardiology | Admitting: Cardiology

## 2016-08-20 DIAGNOSIS — Z955 Presence of coronary angioplasty implant and graft: Secondary | ICD-10-CM

## 2016-08-20 DIAGNOSIS — I214 Non-ST elevation (NSTEMI) myocardial infarction: Secondary | ICD-10-CM

## 2016-08-22 ENCOUNTER — Telehealth: Payer: Self-pay | Admitting: Cardiology

## 2016-08-22 ENCOUNTER — Encounter (HOSPITAL_COMMUNITY)
Admission: RE | Admit: 2016-08-22 | Discharge: 2016-08-22 | Disposition: A | Discharge status: Home / Self Care | Payer: Medicare Other | Source: Ambulatory Visit | Attending: Cardiology | Admitting: Cardiology

## 2016-08-22 DIAGNOSIS — I214 Non-ST elevation (NSTEMI) myocardial infarction: Secondary | ICD-10-CM | POA: Diagnosis not present

## 2016-08-22 DIAGNOSIS — Z955 Presence of coronary angioplasty implant and graft: Secondary | ICD-10-CM

## 2016-08-22 NOTE — Telephone Encounter (Signed)
pt arrived at cardiac rehab and "felt a little dizzy' getting out of car, symptoms resolved once in building. Randa Evens obtained orthostatic vitals   126/48 HR 60 lying 123/60 HR 65 sitting 109/55 HR 72 standing  symptoms when lying to sitting  currently tolerating rehab exercises w/o problems.  Koziel advised pt to increase fluid intake pre-exercise. Pt requested Dr. Antoine Poche to be aware of issue, Hurrell calling to report. No concerns noted at this time.  Will route as FYI per patient.

## 2016-08-22 NOTE — Telephone Encounter (Signed)
Agree with increased fluids but no change in meds at this point.  Call Ms. Umphlett with the results and send results to Levon HedgerEBBIE SCHOENHOFF, MD

## 2016-08-24 ENCOUNTER — Encounter (HOSPITAL_COMMUNITY)
Admission: RE | Admit: 2016-08-24 | Discharge: 2016-08-24 | Disposition: A | Discharge status: Home / Self Care | Payer: Medicare Other | Source: Ambulatory Visit | Attending: Cardiology | Admitting: Cardiology

## 2016-08-24 DIAGNOSIS — I214 Non-ST elevation (NSTEMI) myocardial infarction: Secondary | ICD-10-CM | POA: Diagnosis not present

## 2016-08-24 DIAGNOSIS — Z955 Presence of coronary angioplasty implant and graft: Secondary | ICD-10-CM

## 2016-08-27 ENCOUNTER — Encounter (HOSPITAL_COMMUNITY)
Admission: RE | Admit: 2016-08-27 | Discharge: 2016-08-27 | Disposition: A | Discharge status: Home / Self Care | Payer: Medicare Other | Source: Ambulatory Visit | Attending: Cardiology | Admitting: Cardiology

## 2016-08-27 DIAGNOSIS — I214 Non-ST elevation (NSTEMI) myocardial infarction: Secondary | ICD-10-CM | POA: Diagnosis not present

## 2016-08-27 DIAGNOSIS — Z955 Presence of coronary angioplasty implant and graft: Secondary | ICD-10-CM

## 2016-08-29 ENCOUNTER — Encounter (HOSPITAL_COMMUNITY)
Admission: RE | Admit: 2016-08-29 | Discharge: 2016-08-29 | Disposition: A | Discharge status: Home / Self Care | Payer: Medicare Other | Source: Ambulatory Visit | Attending: Cardiology | Admitting: Cardiology

## 2016-08-29 DIAGNOSIS — I214 Non-ST elevation (NSTEMI) myocardial infarction: Secondary | ICD-10-CM | POA: Diagnosis not present

## 2016-08-29 DIAGNOSIS — Z955 Presence of coronary angioplasty implant and graft: Secondary | ICD-10-CM

## 2016-08-31 ENCOUNTER — Encounter (HOSPITAL_COMMUNITY)
Admission: RE | Admit: 2016-08-31 | Discharge: 2016-08-31 | Disposition: A | Discharge status: Home / Self Care | Payer: Medicare Other | Source: Ambulatory Visit | Attending: Cardiology | Admitting: Cardiology

## 2016-08-31 DIAGNOSIS — Z955 Presence of coronary angioplasty implant and graft: Secondary | ICD-10-CM

## 2016-08-31 DIAGNOSIS — I214 Non-ST elevation (NSTEMI) myocardial infarction: Secondary | ICD-10-CM | POA: Diagnosis not present

## 2016-09-03 ENCOUNTER — Encounter (HOSPITAL_COMMUNITY)
Admission: RE | Admit: 2016-09-03 | Discharge: 2016-09-03 | Disposition: A | Discharge status: Home / Self Care | Payer: Medicare Other | Source: Ambulatory Visit | Attending: Cardiology | Admitting: Cardiology

## 2016-09-03 DIAGNOSIS — Z955 Presence of coronary angioplasty implant and graft: Secondary | ICD-10-CM

## 2016-09-03 DIAGNOSIS — I214 Non-ST elevation (NSTEMI) myocardial infarction: Secondary | ICD-10-CM

## 2016-09-03 NOTE — Progress Notes (Signed)
Victoria Delgado 75 y.o. female Nutrition Note Spoke with pt. Pt states she wants to lose wt "but it's not working out so well." Wt loss tips discussed including bariatric surgery as an option to help promote wt loss. Nutrition Survey reviewed with pt. Pt is following Step 2 of the Therapeutic Lifestyle Changes diet. Pt expressed understanding of the information reviewed. Pt aware of nutrition education classes offered and may attend nutrition classes. No results found for: HGBA1C Wt Readings from Last 3 Encounters:  07/27/16 290 lb (131.5 kg)  07/24/16 289 lb 0.4 oz (131.1 kg)  05/25/16 292 lb 12.8 oz (132.8 kg)   Nutrition Diagnosis ? Food-and nutrition-related knowledge deficit related to lack of exposure to information as related to diagnosis of: ? CVD ? Obesity related to excessive energy intake as evidenced by a BMI of 44.2 Nutrition Intervention ? Benefits of adopting Therapeutic Lifestyle Changes discussed when Medficts reviewed. ? Pt to attend the Portion Distortion class ? Pt to attend the  ? Nutrition I class                        ? Nutrition II class ? Pt given handouts for: ? Nutrition I class ? Nutrition II class  ? Continue client-centered nutrition education by RD, as part of interdisciplinary care.  Goal(s) ? Pt to identify food quantities necessary to achieve weight loss of 6-24 lb (2.7-10.9 kg) at graduation from cardiac rehab.   Monitor and Evaluate progress toward nutrition goal with team.  Mickle Plumb, M.Ed, RD, LDN, CDE 09/03/2016 4:07 PM

## 2016-09-05 ENCOUNTER — Encounter (HOSPITAL_COMMUNITY)
Admission: RE | Admit: 2016-09-05 | Discharge: 2016-09-05 | Disposition: A | Discharge status: Home / Self Care | Payer: Medicare Other | Source: Ambulatory Visit | Attending: Cardiology | Admitting: Cardiology

## 2016-09-05 DIAGNOSIS — I214 Non-ST elevation (NSTEMI) myocardial infarction: Secondary | ICD-10-CM

## 2016-09-05 DIAGNOSIS — Z955 Presence of coronary angioplasty implant and graft: Secondary | ICD-10-CM

## 2016-09-07 ENCOUNTER — Encounter (HOSPITAL_COMMUNITY)
Admission: RE | Admit: 2016-09-07 | Discharge: 2016-09-07 | Disposition: A | Discharge status: Home / Self Care | Payer: Medicare Other | Source: Ambulatory Visit | Attending: Cardiology | Admitting: Cardiology

## 2016-09-07 DIAGNOSIS — Z955 Presence of coronary angioplasty implant and graft: Secondary | ICD-10-CM

## 2016-09-07 DIAGNOSIS — I214 Non-ST elevation (NSTEMI) myocardial infarction: Secondary | ICD-10-CM | POA: Diagnosis not present

## 2016-09-10 ENCOUNTER — Encounter (HOSPITAL_COMMUNITY): Payer: Medicare Other

## 2016-09-12 ENCOUNTER — Encounter (HOSPITAL_COMMUNITY)
Admission: RE | Admit: 2016-09-12 | Discharge: 2016-09-12 | Disposition: A | Discharge status: Home / Self Care | Payer: Medicare Other | Source: Ambulatory Visit | Attending: Cardiology | Admitting: Cardiology

## 2016-09-14 ENCOUNTER — Encounter (HOSPITAL_COMMUNITY): Payer: Medicare Other

## 2016-09-14 NOTE — Progress Notes (Signed)
Cardiac Individual Treatment Plan  Patient Details  Name: Victoria Delgado MRN: [id number] Date of Birth: 01/16/1941 Referring Provider:   Flowsheet Row CARDIAC REHAB PHASE II ORIENTATION [facility]  Referring Provider  Angelina SheriffHochrein, Jake MD      Initial Encounter Date:  Flowsheet Row CARDIAC REHAB PHASE II ORIENTATION [facility]2016 [facility]  Date  07/26/16  Referring Provider  Angelina SheriffHochrein, Jake MD      Visit Diagnosis: NSTEMI (non-ST elevated myocardial infarction) [facility](HCC)  Stented coronary artery  Patient's Home Medications on Admission:  Current Outpatient Prescriptions:  .  acetaminophen (TYLENOL) 500 MG tablet, Take 500 mg by mouth every 6 (six) hours as needed for mild pain., Disp: , Rfl:  .  aspirin EC 81 MG tablet, Take 81 mg by mouth daily., Disp: , Rfl:  .  CARTIA XT 240 MG 24 hr capsule, TAKE 1 CAPSULE(240 MG) BY MOUTH AT BEDTIME, Disp: 90 capsule, Rfl: 3 .  furosemide (LASIX) 40 MG tablet, Take one daily with your potassium pill, Disp: 90 tablet, Rfl: 3 .  nitroGLYCERIN (NITROSTAT) 0.4 MG SL tablet, Place 1 tablet (0.4 mg total) under the tongue every 5 (five) minutes as needed for chest pain., Disp: 25 tablet, Rfl: 3 .  potassium chloride (K-DUR,KLOR-CON) 10 MEQ tablet, Take 10 mEq by mouth daily. , Disp: , Rfl:  .  prasugrel (EFFIENT) 10 MG TABS tablet, Take 1 tablet (10 mg total) by mouth daily., Disp: 90 tablet, Rfl: 3 .  ranitidine (ZANTAC) 150 MG tablet, Take 150 mg by mouth 2 (two) times daily., Disp: , Rfl:  .  rosuvastatin (CRESTOR) 20 MG tablet, Take 1 tablet (20 mg total) by mouth daily., Disp: 90 tablet, Rfl: 3 .  simvastatin (ZOCOR) 40 MG tablet, Take 1 tablet (40 mg total) by mouth daily., Disp: 90 tablet, Rfl: 3 .  TOPROL XL 50 MG 24 hr tablet, TAKE 1 TABLET BY MOUTH EVERY MORNING AND 1/2 TABLET EVERY EVENING, Disp: 135 tablet, Rfl: 2  Past Medical History: Past Medical  History:  Diagnosis Date  . Arthritis    osteoarthritis-hips,knees, back, hands  . Asymmetric septal hypertrophy (HCC)   . Atrial fibrillation (HCC)   . CAD (coronary artery disease)    2 drug-eluting stents placed in the circumflex leading into a marginal.  [place] [place]  . Cataract    corrective surgery done  . Fatty liver   . Gallstones   . GERD (gastroesophageal reflux disease)   . Heart palpitations   . Hepatitis    hepatitis A; past hx.,many yrs ago ? food source  . Hernia, hiatal   . Hyperlipidemia   . Hyperlipidemia   . Hypertension   . Obesity   . Sleep apnea    CPAP    Tobacco Use: History  Smoking Status  . Former Smoker  Smokeless Tobacco  . Never Used    Comment: QUIT IN 1990    Labs: Recent Review Flowsheet Data    Labs for ITP Cardiac and Pulmonary Rehab Latest Ref Rng & Units 03/05/2013 04/20/2014 09/12/2015 02/24/2016 07/03/2016   Cholestrol 125 - 200 mg/dL 409(W261(H) 119(J262(H) 478199 - 295158   LDLCALC <130 mg/dL 621(H177(H) 086(V181(H) 784105 - 85   HDL >=46 mg/dL 69(G34(L) 29(B36(L) 28(U29(L) - 13(K36(L)   Trlycerides <150 mg/dL 440(N252(H) 027(O227(H) 536(U323(H) - 185(H)   TCO2 0 - 100 mmol/L - - - 24 -      Capillary  Blood Glucose: No results found for: GLUCAP   Exercise Target Goals:    Exercise Program Goal: Individual exercise prescription set with THRR, safety & activity barriers. Participant demonstrates ability to understand and report RPE using BORG scale, to self-measure pulse accurately, and to acknowledge the importance of the exercise prescription.  Exercise Prescription Goal: Starting with aerobic activity 30 plus minutes a day, 3 days per week for initial exercise prescription. Provide home exercise prescription and guidelines that participant acknowledges understanding prior to discharge.  Activity Barriers & Risk Stratification:     Activity Barriers & Cardiac Risk Stratification - 07/24/16 1654      Activity Barriers & Cardiac Risk Stratification   Activity Barriers  Back Problems;Arthritis;History of Falls   Cardiac Risk Stratification High      6 Minute Walk:     6 Minute Walk    Row Name 07/24/16 1722 07/26/16 1744 07/26/16 1752     6 Minute Walk   Phase  - Initial  -   Distance 963 feet  -  -   Walk Time 6 minutes  -  -   # of Rest Breaks  - 0  -   MPH  - 1.82  -   METS  - 0.9  -   RPE 12  -  -   VO2 Peak  - 3.2  -   Symptoms  - No Yes (comment)   Comments  -  - c/o chronic B hip pain during the walk test. Used a rolling walker for stability    Resting HR 61 bpm  -  -   Resting BP 143/64  -  -   Max Ex. HR 72 bpm  -  -   Max Ex. BP 122/70  -  -   2 Minute Post BP 102/60  -  -      Initial Exercise Prescription:     Initial Exercise Prescription - 07/26/16 1700      Date of Initial Exercise RX and Referring Provider   Date 07/26/16   Referring Provider Angelina Sheriff MD     NuStep   Level 1   Minutes 30   METs 1.5     Prescription Details   Frequency (times per week) 3   Duration Progress to 30 minutes of continuous aerobic without signs/symptoms of physical distress     Intensity   THRR 40-80% of Max Heartrate 66-118   Ratings of Perceived Exertion 11-13   Perceived Dyspnea 0-4     Progression   Progression Continue to progress workloads to maintain intensity without signs/symptoms of physical distress.     Resistance Training   Training Prescription Yes   Weight 1lb   Reps 10-12      Perform Capillary Blood Glucose checks as needed.  Exercise Prescription Changes:     Exercise Prescription Changes    Row Name 08/13/16 1600             Exercise Review   Progression Yes         Response to Exercise   Blood Pressure (Admit) 124/72       Blood Pressure (Exercise) 132/70       Blood Pressure (Exit) 106/70       Heart Rate (Admit) 59 bpm       Heart Rate (Exercise) 80 bpm       Heart Rate (Exit) 53 bpm       Rating of Perceived Exertion (Exercise) 12  Comments Reviewed HEP on 08/13/16        Duration Progress to 30 minutes of continuous aerobic without signs/symptoms of physical distress       Intensity THRR unchanged         Progression   Average METs 1.7         Resistance Training   Training Prescription Yes       Weight 2lbs       Reps 10-12         NuStep   Level 1       Minutes 30       METs 1.7         Home Exercise Plan   Plans to continue exercise at Home  Reviewed HEP on 08/13/16. See progress note       Frequency Add 2 additional days to program exercise sessions.          Exercise Comments:     Exercise Comments    Row Name 08/13/16 1632           Exercise Comments Reviewed METs and goals. Pt is tolerating exercise well; will continue to monitor exercise progression.          Discharge Exercise Prescription (Final Exercise Prescription Changes):     Exercise Prescription Changes - 08/13/16 1600      Exercise Review   Progression Yes     Response to Exercise   Blood Pressure (Admit) 124/72   Blood Pressure (Exercise) 132/70   Blood Pressure (Exit) 106/70   Heart Rate (Admit) 59 bpm   Heart Rate (Exercise) 80 bpm   Heart Rate (Exit) 53 bpm   Rating of Perceived Exertion (Exercise) 12   Comments Reviewed HEP on 08/13/16   Duration Progress to 30 minutes of continuous aerobic without signs/symptoms of physical distress   Intensity THRR unchanged     Progression   Average METs 1.7     Resistance Training   Training Prescription Yes   Weight 2lbs   Reps 10-12     NuStep   Level 1   Minutes 30   METs 1.7     Home Exercise Plan   Plans to continue exercise at Home  Reviewed HEP on 08/13/16. See progress note   Frequency Add 2 additional days to program exercise sessions.      Nutrition:  Target Goals: Understanding of nutrition guidelines, daily intake of sodium 1500mg , cholesterol 200mg , calories 30% from fat and 7% or less from saturated fats, daily to have 5 or more servings of fruits and  vegetables.  Biometrics:    Nutrition Therapy Plan and Nutrition Goals:     Nutrition Therapy & Goals - 09/03/16 1609      Nutrition Therapy   Diet Therapeutic Lifestyle Changes     Personal Nutrition Goals   Personal Goal #1 1-2 lb wt loss/week to a goal of 6-24 lb wt loss     Intervention Plan   Intervention Prescribe, educate and counsel regarding individualized specific dietary modifications aiming towards targeted core components such as weight, hypertension, lipid management, diabetes, heart failure and other comorbidities.   Expected Outcomes Short Term Goal: Understand basic principles of dietary content, such as calories, fat, sodium, cholesterol and nutrients.;Long Term Goal: Adherence to prescribed nutrition plan.      Nutrition Discharge: Nutrition Scores:     Nutrition Assessments - 09/03/16 1606      MEDFICTS Scores   Pre Score 19      Nutrition  Goals Re-Evaluation:   Psychosocial: Target Goals: Acknowledge presence or absence of depression, maximize coping skills, provide positive support system. Participant is able to verbalize types and ability to use techniques and skills needed for reducing stress and depression.  Initial Review & Psychosocial Screening:     Initial Psych Review & Screening - 07/24/16 1703      Initial Review   Current issues with Current Stress Concerns   Source of Stress Concerns Chronic Illness;Unable to participate in former interests or hobbies     Family Dynamics   Good Support System? Yes     Barriers   Psychosocial barriers to participate in program There are no identifiable barriers or psychosocial needs.     Screening Interventions   Interventions Encouraged to exercise      Quality of Life Scores:     Quality of Life - 08/29/16 1701      Quality of Life Scores   GLOBAL Pre --  reviewed with pt who reports major concern is health related physical decline and inablity to participate in tasks as prior to  cardiac event. pt health related anxiety due to cardiac event occurring while on vacation.       PHQ-9: Recent Review Flowsheet Data    Depression screen [facility] 2/9 07/30/2016 04/20/2014 03/05/2013   Decreased Interest - 0 0   Down, Depressed, Hopeless 1  0 1   PHQ - 2 Score 1 0 1      Psychosocial Evaluation and Intervention:     Psychosocial Evaluation - 08/17/16 1654      Psychosocial Evaluation & Interventions   Continued Psychosocial Services Needed --      Psychosocial Re-Evaluation:     Psychosocial Re-Evaluation    Row Name 09/12/16 1134             Psychosocial Re-Evaluation   Interventions Encouraged to attend Cardiac Rehabilitation for the exercise       Comments pt health related anxiety is diminishing. pt feels more comfortable and confident with travel.  pt is looking forward to traveling with family to [place] this week. pt encouraged to pace herself and take rest breaks when necessary.  pt reports she and her family have noticed her increased strength and stamina.       Continued Psychosocial Services Needed No  no psychosocial needs identified, no inteventions necessary           Vocational Rehabilitation: Provide vocational rehab assistance to qualifying candidates.   Vocational Rehab Evaluation & Intervention:     Vocational Rehab - 07/24/16 1655      Initial Vocational Rehab Evaluation & Intervention   Assessment shows need for Vocational Rehabilitation No      Education: Education Goals: Education classes will be provided on a weekly basis, covering required topics. Participant will state understanding/return demonstration of topics presented.  Learning Barriers/Preferences:     Learning Barriers/Preferences - 07/24/16 1226      Learning Barriers/Preferences   Learning Barriers Sight;Hearing   Learning Preferences Written Material      Education Topics: Count Your Pulse:  -Group instruction provided by verbal instruction,  demonstration, patient participation and written materials to support subject.  Instructors address importance of being able to find your pulse and how to count your pulse when at home without a heart monitor.  Patients get hands on experience counting their pulse with staff help and individually. Flowsheet Row CARDIAC REHAB PHASE II EXERCISE from 09/05/2016 in [facility]  Date  08/03/16  Educator  Gladstone Lighter, RN  Instruction Review Code  2- meets goals/outcomes      Heart Attack, Angina, and Risk Factor Modification:  -Group instruction provided by verbal instruction, video, and written materials to support subject.  Instructors address signs and symptoms of angina and heart attacks.    Also discuss risk factors for heart disease and how to make changes to improve heart health risk factors.   Functional Fitness:  -Group instruction provided by verbal instruction, demonstration, patient participation, and written materials to support subject.  Instructors address safety measures for doing things around the house.  Discuss how to get up and down off the floor, how to pick things up properly, how to safely get out of a chair without assistance, and balance training.   Meditation and Mindfulness:  -Group instruction provided by verbal instruction, patient participation, and written materials to support subject.  Instructor addresses importance of mindfulness and meditation practice to help reduce stress and improve awareness.  Instructor also leads participants through a meditation exercise.    Stretching for Flexibility and Mobility:  -Group instruction provided by verbal instruction, patient participation, and written materials to support subject.  Instructors lead participants through series of stretches that are designed to increase flexibility thus improving mobility.  These stretches are additional exercise for major muscle groups that are typically  performed during regular warm up and cool down.   Hands Only CPR Anytime:  -Group instruction provided by verbal instruction, video, patient participation and written materials to support subject.  Instructors co-teach with AHA video for hands only CPR.  Participants get hands on experience with mannequins.   Nutrition I class: Heart Healthy Eating:  -Group instruction provided by PowerPoint slides, verbal discussion, and written materials to support subject matter. The instructor gives an explanation and review of the Therapeutic Lifestyle Changes diet recommendations, which includes a discussion on lipid goals, dietary fat, sodium, fiber, plant stanol/sterol esters, sugar, and the components of a well-balanced, healthy diet. Flowsheet Row CARDIAC REHAB PHASE II EXERCISE from 09/05/2016 in [facility] CARDIAC REHAB  Date  09/04/16  Educator  RD  Instruction Review Code  2- meets goals/outcomes      Nutrition II class: Lifestyle Skills:  -Group instruction provided by PowerPoint slides, verbal discussion, and written materials to support subject matter. The instructor gives an explanation and review of label reading, grocery shopping for heart health, heart healthy recipe modifications, and ways to make healthier choices when eating out. Flowsheet Row CARDIAC REHAB PHASE II EXERCISE from 09/05/2016 in [facility] CARDIAC REHAB  Date  09/03/16  Educator  RD  Instruction Review Code  Not applicable [class handouts given]      Diabetes Question & Answer:  -Group instruction provided by PowerPoint slides, verbal discussion, and written materials to support subject matter. The instructor gives an explanation and review of diabetes co-morbidities, pre- and post-prandial blood glucose goals, pre-exercise blood glucose goals, signs, symptoms, and treatment of hypoglycemia and hyperglycemia, and foot care basics.   Diabetes Blitz:  -Group instruction provided by  PowerPoint slides, verbal discussion, and written materials to support subject matter. The instructor gives an explanation and review of the physiology behind type 1 and type 2 diabetes, diabetes medications and rational behind using different medications, pre- and post-prandial blood glucose recommendations and Hemoglobin A1c goals, diabetes diet, and exercise including blood glucose guidelines for exercising safely.    Portion Distortion:  -Group instruction provided by PowerPoint slides,  verbal discussion, written materials, and food models to support subject matter. The instructor gives an explanation of serving size versus portion size, changes in portions sizes over the last 20 years, and what consists of a serving from each food group. Flowsheet Row CARDIAC REHAB PHASE II EXERCISE from 09/05/2016 in [facility] CARDIAC REHAB  Date  09/05/16  Educator  RD  Instruction Review Code  2- meets goals/outcomes      Stress Management:  -Group instruction provided by verbal instruction, video, and written materials to support subject matter.  Instructors review role of stress in heart disease and how to cope with stress positively.   Flowsheet Row CARDIAC REHAB PHASE II EXERCISE from 09/05/2016 in [facility] CARDIAC REHAB  Date  08/29/16  Instruction Review Code  2- meets goals/outcomes      Exercising on Your Own:  -Group instruction provided by verbal instruction, power point, and written materials to support subject.  Instructors discuss benefits of exercise, components of exercise, frequency and intensity of exercise, and end points for exercise.  Also discuss use of nitroglycerin and activating EMS.  Review options of places to exercise outside of rehab.  Review guidelines for sex with heart disease. Flowsheet Row CARDIAC REHAB PHASE II EXERCISE from 09/05/2016 in [facility] CARDIAC REHAB  Date  08/22/16  Instruction Review Code  2- meets  goals/outcomes      Cardiac Drugs I:  -Group instruction provided by verbal instruction and written materials to support subject.  Instructor reviews cardiac drug classes: antiplatelets, anticoagulants, beta blockers, and statins.  Instructor discusses reasons, side effects, and lifestyle considerations for each drug class.   Cardiac Drugs II:  -Group instruction provided by verbal instruction and written materials to support subject.  Instructor reviews cardiac drug classes: angiotensin converting enzyme inhibitors (ACE-I), angiotensin II receptor blockers (ARBs), nitrates, and calcium channel blockers.  Instructor discusses reasons, side effects, and lifestyle considerations for each drug class.   Anatomy and Physiology of the Circulatory System:  -Group instruction provided by verbal instruction, video, and written materials to support subject.  Reviews functional anatomy of heart, how it relates to various diagnoses, and what role the heart plays in the overall system. Flowsheet Row CARDIAC REHAB PHASE II EXERCISE from 09/05/2016 in [facility] CARDIAC REHAB  Date  08/08/16  Instruction Review Code  2- meets goals/outcomes      Knowledge Questionnaire Score:     Knowledge Questionnaire Score - 07/26/16 1748      Knowledge Questionnaire Score   Pre Score 23/24      Core Components/Risk Factors/Patient Goals at Admission:     Personal Goals and Risk Factors at Admission - 07/24/16 1707      Core Components/Risk Factors/Patient Goals on Admission   Hypertension Yes   Intervention Provide education on lifestyle modifcations including regular physical activity/exercise, weight management, moderate sodium restriction and increased consumption of fresh fruit, vegetables, and low fat dairy, alcohol moderation, and smoking cessation.;Monitor prescription use compliance.   Expected Outcomes Short Term: Continued assessment and intervention until BP is < 140/53mm HG in  hypertensive participants. < 130/52mm HG in hypertensive participants with diabetes, heart failure or chronic kidney disease.;Long Term: Maintenance of blood pressure at goal levels.   Lipids Yes   Intervention Provide education and support for participant on nutrition & aerobic/resistive exercise along with prescribed medications to achieve LDL 70mg , HDL >40mg .   Expected Outcomes Short Term: Participant states understanding of desired cholesterol  values and is compliant with medications prescribed. Participant is following exercise prescription and nutrition guidelines.;Long Term: Cholesterol controlled with medications as prescribed, with individualized exercise RX and with personalized nutrition plan. Value goals: LDL < 70mg , HDL > 40 mg.      Core Components/Risk Factors/Patient Goals Review:      Goals and Risk Factor Review    Row Name 08/13/16 1632             Core Components/Risk Factors/Patient Goals Review   Personal Goals Review Other;Increase Strength and Stamina       Review Discussed home exercise to assist with improving cardiovascular fitness.        Expected Outcomes Pt will exercise 2-3x/week at home           Core Components/Risk Factors/Patient Goals at Discharge (Final Review):      Goals and Risk Factor Review - 08/13/16 1632      Core Components/Risk Factors/Patient Goals Review   Personal Goals Review Other;Increase Strength and Stamina   Review Discussed home exercise to assist with improving cardiovascular fitness.    Expected Outcomes Pt will exercise 2-3x/week at home       ITP Comments:     ITP Comments    Row Name 07/24/16 1221 09/07/16 1442         ITP Comments Dr. Armanda Magic, Medical Director  attended Hypertension education lecture         Comments: Pt is making expected progress toward personal goals after completing 15 sessions.  Pt absences have been due to previously scheduled family vacations.  Recommend continued exercise and  life style modification education including  stress management and relaxation techniques to decrease cardiac risk profile.

## 2016-09-17 ENCOUNTER — Encounter (HOSPITAL_COMMUNITY): Payer: Medicare Other

## 2016-09-19 ENCOUNTER — Encounter (HOSPITAL_COMMUNITY): Payer: Medicare Other

## 2016-09-21 ENCOUNTER — Encounter (HOSPITAL_COMMUNITY): Payer: Medicare Other

## 2016-09-24 ENCOUNTER — Encounter (HOSPITAL_COMMUNITY)
Admission: RE | Admit: 2016-09-24 | Discharge: 2016-09-24 | Disposition: A | Discharge status: Home / Self Care | Payer: Medicare Other | Source: Ambulatory Visit | Attending: Cardiology | Admitting: Cardiology

## 2016-09-24 DIAGNOSIS — Z955 Presence of coronary angioplasty implant and graft: Secondary | ICD-10-CM

## 2016-09-24 DIAGNOSIS — I214 Non-ST elevation (NSTEMI) myocardial infarction: Secondary | ICD-10-CM

## 2016-09-24 NOTE — Progress Notes (Signed)
Incomplete Session Note  Patient Details  Name: Victoria Delgado MRN: [id number] Date of Birth: Nov 05, 1941 Referring Provider:   Flowsheet Row CARDIAC REHAB PHASE II ORIENTATION from 07/24/2016 in [facility] CARDI[facility] [place]  Referring Provider  Angelina Sheriff MD      Victoria Delgado did not complete her rehab session.  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 Victoria Delgado came to cardiac rehab to let us know that she does not know when she will be able to return to exercise at cardiac rehab. Victoria Delgado will follow up with her eye doctor tomorrow and let us know.

## 2016-09-26 ENCOUNTER — Encounter (HOSPITAL_COMMUNITY): Payer: Medicare Other

## 2016-09-27 ENCOUNTER — Other Ambulatory Visit: Payer: Self-pay | Admitting: Cardiology

## 2016-09-27 ENCOUNTER — Ambulatory Visit: Payer: Self-pay | Admitting: Internal Medicine

## 2016-09-28 ENCOUNTER — Encounter (HOSPITAL_COMMUNITY): Payer: Medicare Other

## 2016-09-28 NOTE — Telephone Encounter (Signed)
Rx(s) sent to pharmacy electronically.  

## 2016-10-01 ENCOUNTER — Encounter (HOSPITAL_COMMUNITY): Payer: Medicare Other

## 2016-10-03 ENCOUNTER — Encounter (HOSPITAL_COMMUNITY): Payer: Medicare Other

## 2016-10-03 ENCOUNTER — Telehealth: Payer: Self-pay | Admitting: Cardiology

## 2016-10-03 NOTE — Telephone Encounter (Signed)
Patient wanted to know if she needs to have sclera buckling for detach retina with Dr Clelia Croft ( [facility]) should she stop/hold effient for surgery. It has not been schedule yet , but patient is expecting that to be the next step. ( she will also need cardiac clearance)

## 2016-10-03 NOTE — Telephone Encounter (Signed)
Patient is taking Effient since May.  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  She is nervous about having surgery while taking Effient. Please call pt.

## 2016-10-04 NOTE — Telephone Encounter (Signed)
She should not hold the Effient for 12 months if the procedure is elective.  However, if it is needed to preserve her sight and it cannot be done on Effient she would need to hold that medication for 7 days prior to the procedure.

## 2016-10-04 NOTE — Telephone Encounter (Signed)
Spoke to patient . information given.  if or when Dr Alver Fisher call for clearance to hold effient , office can send this message. Patient verbalized understanding.

## 2016-10-05 ENCOUNTER — Encounter (HOSPITAL_COMMUNITY): Payer: Medicare Other

## 2016-10-08 ENCOUNTER — Encounter (HOSPITAL_COMMUNITY): Payer: Medicare Other

## 2016-10-10 ENCOUNTER — Encounter (HOSPITAL_COMMUNITY): Payer: Medicare Other

## 2016-10-10 ENCOUNTER — Encounter (HOSPITAL_COMMUNITY): Payer: Self-pay | Admitting: Cardiac Rehabilitation

## 2016-10-10 NOTE — Addendum Note (Signed)
Encounter addended by: Robyne Peers, RN on: 10/10/2016  6:05 PM<BR>    Actions taken: Visit Navigator Flowsheet section accepted, Sign clinical note

## 2016-10-10 NOTE — Progress Notes (Signed)
Cardiac Individual Treatment Plan  Patient Details  Name: Victoria Delgado MRN: [id number] Date of Birth: 01-May-1941 Referring Provider:   Flowsheet Row CARDIAC REHAB PHASE II [facility]  Referring Provider  Angelina SheriffHochrein, Jake MD      Initial Encounter Date:  Flowsheet Row CARDIAC REHAB PHASE II ORIENTATION [facility]/24/2016 [facility]  Date  07/26/16  Referring Provider  Angelina SheriffHochrein, Jake MD      Visit Diagnosis: No diagnosis found.  Patient's Home Medications on Admission:  Current Outpatient Prescriptions:  .  acetaminophen (TYLENOL) 500 MG tablet, Take 500 mg by mouth every 6 (six) hours as needed for mild pain., Disp: , Rfl:  .  aspirin EC 81 MG tablet, Take 81 mg by mouth daily., Disp: , Rfl:  .  CARTIA XT 240 MG 24 hr capsule, TAKE 1 CAPSULE(240 MG) BY MOUTH AT BEDTIME, Disp: 90 capsule, Rfl: 3 .  furosemide (LASIX) 40 MG tablet, TAKE 1 TABLET BY MOUTH EVERY DAY WITH POTASSIUM PILL, Disp: 90 tablet, Rfl: 2 .  nitroGLYCERIN (NITROSTAT) 0.4 MG SL tablet, Place 1 tablet (0.4 mg total) under the tongue every 5 (five) minutes as needed for chest pain., Disp: 25 tablet, Rfl: 3 .  potassium chloride (K-DUR,KLOR-CON) 10 MEQ tablet, Take 10 mEq by mouth daily. , Disp: , Rfl:  .  ranitidine (ZANTAC) 150 MG tablet, Take 150 mg by mouth 2 (two) times daily., Disp: , Rfl:  .  rosuvastatin (CRESTOR) 20 MG tablet, Take 1 tablet (20 mg total) by mouth daily., Disp: 90 tablet, Rfl: 3 .  simvastatin (ZOCOR) 40 MG tablet, Take 1 tablet (40 mg total) by mouth daily., Disp: 90 tablet, Rfl: 3 .  TOPROL XL 50 MG 24 hr tablet, TAKE 1 TABLET BY MOUTH EVERY MORNING AND 1/2 TABLET EVERY EVENING, Disp: 135 tablet, Rfl: 2  Past Medical History: Past Medical History:  Diagnosis Date  . Arthritis    osteoarthritis-hips,knees, back, hands  . Asymmetric septal hypertrophy (HCC)   . Atrial fibrillation (HCC)   . CAD  (coronary artery disease)    2 drug-eluting stents placed in the circumflex leading into a marginal.  [place]  . Cataract    corrective surgery done  . Fatty liver   . Gallstones   . GERD (gastroesophageal reflux disease)   . Heart palpitations   . Hepatitis    hepatitis A; past hx.,many yrs ago ? food source  . Hernia, hiatal   . Hyperlipidemia   . Hyperlipidemia   . Hypertension   . Obesity   . Sleep apnea    CPAP    Tobacco Use: History  Smoking Status  . Former Smoker  Smokeless Tobacco  . Never Used    Comment: QUIT IN 1990    Labs: Recent Review Flowsheet Data    Labs for ITP Cardiac and Pulmonary Rehab Latest Ref Rng & Units 03/05/2013 04/20/2014 09/12/2015 02/24/2016 07/03/2016   Cholestrol 125 - 200 mg/dL 409(W261(H) 119(J262(H) 478199 - 295158   LDLCALC <130 mg/dL 621(H177(H) 086(V181(H) 784105 - 85   HDL >=46 mg/dL 69(G34(L) 29(B36(L) 28(U29(L) - 13(K36(L)   Trlycerides <150 mg/dL 440(N252(H) 027(O227(H) 536(U323(H) - 185(H)   TCO2 0 - 100 mmol/L - - - 24 -      Capillary Blood Glucose: No results found for: GLUCAP   Exercise Target Goals:    Exercise Program Goal: Individual exercise prescription set with THRR, safety &  activity barriers. Participant demonstrates ability to understand and report RPE using BORG scale, to self-measure pulse accurately, and to acknowledge the importance of the exercise prescription.  Exercise Prescription Goal: Starting with aerobic activity 30 plus minutes a day, 3 days per week for initial exercise prescription. Provide home exercise prescription and guidelines that participant acknowledges understanding prior to discharge.  Activity Barriers & Risk Stratification:   6 Minute Walk:   Initial Exercise Prescription:   Perform Capillary Blood Glucose checks as needed.  Exercise Prescription Changes:     Exercise Prescription Changes    Row Name 08/13/16 1600             Exercise Review   Progression Yes         Response to Exercise   Blood Pressure  (Admit) 124/72       Blood Pressure (Exercise) 132/70       Blood Pressure (Exit) 106/70       Heart Rate (Admit) 59 bpm       Heart Rate (Exercise) 80 bpm       Heart Rate (Exit) 53 bpm       Rating of Perceived Exertion (Exercise) 12       Comments Reviewed HEP on 08/13/16       Duration Progress to 30 minutes of continuous aerobic without signs/symptoms of physical distress       Intensity THRR unchanged         Progression   Average METs 1.7         Resistance Training   Training Prescription Yes       Weight 2lbs       Reps 10-12         NuStep   Level 1       Minutes 30       METs 1.7         Home Exercise Plan   Plans to continue exercise at Home  Reviewed HEP on 08/13/16. See progress note       Frequency Add 2 additional days to program exercise sessions.          Exercise Comments:     Exercise Comments    Row Name 08/13/16 1632           Exercise Comments Reviewed METs and goals. Pt is tolerating exercise well; will continue to monitor exercise progression.          Discharge Exercise Prescription (Final Exercise Prescription Changes):     Exercise Prescription Changes - 08/13/16 1600      Exercise Review   Progression Yes     Response to Exercise   Blood Pressure (Admit) 124/72   Blood Pressure (Exercise) 132/70   Blood Pressure (Exit) 106/70   Heart Rate (Admit) 59 bpm   Heart Rate (Exercise) 80 bpm   Heart Rate (Exit) 53 bpm   Rating of Perceived Exertion (Exercise) 12   Comments Reviewed HEP on 08/13/16   Duration Progress to 30 minutes of continuous aerobic without signs/symptoms of physical distress   Intensity THRR unchanged     Progression   Average METs 1.7     Resistance Training   Training Prescription Yes   Weight 2lbs   Reps 10-12     NuStep   Level 1   Minutes 30   METs 1.7     Home Exercise Plan   Plans to continue exercise at Home  Reviewed HEP on 08/13/16. See progress note   Frequency  Add 2 additional days to  program exercise sessions.      Nutrition:  Target Goals: Understanding of nutrition guidelines, daily intake of sodium 1500mg , cholesterol 200mg , calories 30% from fat and 7% or less from saturated fats, daily to have 5 or more servings of fruits and vegetables.  Biometrics:    Nutrition Therapy Plan and Nutrition Goals:     Nutrition Therapy & Goals - 09/03/16 1609      Nutrition Therapy   Diet Therapeutic Lifestyle Changes     Personal Nutrition Goals   Personal Goal #1 1-2 lb wt loss/week to a goal of 6-24 lb wt loss     Intervention Plan   Intervention Prescribe, educate and counsel regarding individualized specific dietary modifications aiming towards targeted core components such as weight, hypertension, lipid management, diabetes, heart failure and other comorbidities.   Expected Outcomes Short Term Goal: Understand basic principles of dietary content, such as calories, fat, sodium, cholesterol and nutrients.;Long Term Goal: Adherence to prescribed nutrition plan.      Nutrition Discharge: Nutrition Scores:     Nutrition Assessments - 09/03/16 1606      MEDFICTS Scores   Pre Score 19      Nutrition Goals Re-Evaluation:   Psychosocial: Target Goals: Acknowledge presence or absence of depression, maximize coping skills, provide positive support system. Participant is able to verbalize types and ability to use techniques and skills needed for reducing stress and depression.  Initial Review & Psychosocial Screening:   Quality of Life Scores:     Quality of Life - 08/29/16 1701      Quality of Life Scores   GLOBAL Pre --  reviewed with pt who reports major concern is health related physical decline and inablity to participate in tasks as prior to cardiac event. pt health related anxiety due to cardiac event occurring while on vacation.       PHQ-9: Recent Review Flowsheet Data    Depression screen [facility] 2/9 07/30/2016 04/20/2014 03/05/2013   Decreased  Interest - 0 0   Down, Depressed, Hopeless 1  0 1   PHQ - 2 Score 1 0 1      Psychosocial Evaluation and Intervention:   Psychosocial Re-Evaluation:     Psychosocial Re-Evaluation    Row Name 09/12/16 1134 10/10/16 1803           Psychosocial Re-Evaluation   Interventions Encouraged to attend Cardiac Rehabilitation for the exercise  -      Comments pt health related anxiety is diminishing. pt feels more comfortable and confident with travel.  pt is looking forward to traveling with family to [place] this week. pt encouraged to pace herself and take rest breaks when necessary.  pt reports she and her family have noticed her increased strength and stamina. pt with frequent absences due to scheduled family vacation and medical complications following her trip.       Continued Psychosocial Services Needed No  no psychosocial needs identified, no inteventions necessary  No         Vocational Rehabilitation: Provide vocational rehab assistance to qualifying candidates.   Vocational Rehab Evaluation & Intervention:   Education: Education Goals: Education classes will be provided on a weekly basis, covering required topics. Participant will state understanding/return demonstration of topics presented.  Learning Barriers/Preferences:   Education Topics: Count Your Pulse:  -Group instruction provided by verbal instruction, demonstration, patient participation and written materials to support subject.  Instructors address importance of being able to find your pulse  and how to count your pulse when at home without a heart monitor.  Patients get hands on experience counting their pulse with staff help and individually. Flowsheet Row CARDIAC REHAB PHASE II EXERCISE from 09/05/2016 in [facility] CARDIAC REHAB  Date  08/03/16  Educator  Gladstone Lighter, RN  Instruction Review Code  2- meets goals/outcomes      Heart Attack, Angina, and Risk Factor Modification:  -Group  instruction provided by verbal instruction, video, and written materials to support subject.  Instructors address signs and symptoms of angina and heart attacks.    Also discuss risk factors for heart disease and how to make changes to improve heart health risk factors.   Functional Fitness:  -Group instruction provided by verbal instruction, demonstration, patient participation, and written materials to support subject.  Instructors address safety measures for doing things around the house.  Discuss how to get up and down off the floor, how to pick things up properly, how to safely get out of a chair without assistance, and balance training.   Meditation and Mindfulness:  -Group instruction provided by verbal instruction, patient participation, and written materials to support subject.  Instructor addresses importance of mindfulness and meditation practice to help reduce stress and improve awareness.  Instructor also leads participants through a meditation exercise.    Stretching for Flexibility and Mobility:  -Group instruction provided by verbal instruction, patient participation, and written materials to support subject.  Instructors lead participants through series of stretches that are designed to increase flexibility thus improving mobility.  These stretches are additional exercise for major muscle groups that are typically performed during regular warm up and cool down.   Hands Only CPR Anytime:  -Group instruction provided by verbal instruction, video, patient participation and written materials to support subject.  Instructors co-teach with [facility] video for hands only CPR.  Participants get hands on experience with mannequins.   Nutrition I class: Heart Healthy Eating:  -Group instruction provided by PowerPoint slides, verbal discussion, and written materials to support subject matter. The instructor gives an explanation and review of the Therapeutic Lifestyle Changes diet recommendations,  which includes a discussion on lipid goals, dietary fat, sodium, fiber, plant stanol/sterol esters, sugar, and the components of a well-balanced, healthy diet. Flowsheet Row CARDIAC REHAB PHASE II EXERCISE from 09/05/2016 in [facility] CARDIAC REHAB  Date  09/04/16  Educator  RD  Instruction Review Code  2- meets goals/outcomes      Nutrition II class: Lifestyle Skills:  -Group instruction provided by PowerPoint slides, verbal discussion, and written materials to support subject matter. The instructor gives an explanation and review of label reading, grocery shopping for heart health, heart healthy recipe modifications, and ways to make healthier choices when eating out. Flowsheet Row CARDIAC REHAB PHASE II EXERCISE from 09/05/2016 in [facility] CARDIAC REHAB  Date  09/03/16  Educator  RD  Instruction Review Code  Not applicable [class handouts given]      Diabetes Question & Answer:  -Group instruction provided by PowerPoint slides, verbal discussion, and written materials to support subject matter. The instructor gives an explanation and review of diabetes co-morbidities, pre- and post-prandial blood glucose goals, pre-exercise blood glucose goals, signs, symptoms, and treatment of hypoglycemia and hyperglycemia, and foot care basics.   Diabetes Blitz:  -Group instruction provided by PowerPoint slides, verbal discussion, and written materials to support subject matter. The instructor gives an explanation and review of the physiology behind type 1 and  type 2 diabetes, diabetes medications and rational behind using different medications, pre- and post-prandial blood glucose recommendations and Hemoglobin A1c goals, diabetes diet, and exercise including blood glucose guidelines for exercising safely.    Portion Distortion:  -Group instruction provided by PowerPoint slides, verbal discussion, written materials, and food models to support subject matter. The  instructor gives an explanation of serving size versus portion size, changes in portions sizes over the last 20 years, and what consists of a serving from each food group. Flowsheet Row CARDIAC REHAB PHASE II EXERCISE from 09/05/2016 in [facility] CARDIAC REHAB  Date  09/05/16  Educator  RD  Instruction Review Code  2- meets goals/outcomes      Stress Management:  -Group instruction provided by verbal instruction, video, and written materials to support subject matter.  Instructors review role of stress in heart disease and how to cope with stress positively.   Flowsheet Row CARDIAC REHAB PHASE II EXERCISE from 09/05/2016 in [facility] CARDIAC REHAB  Date  08/29/16  Instruction Review Code  2- meets goals/outcomes      Exercising on Your Own:  -Group instruction provided by verbal instruction, power point, and written materials to support subject.  Instructors discuss benefits of exercise, components of exercise, frequency and intensity of exercise, and end points for exercise.  Also discuss use of nitroglycerin and activating EMS.  Review options of places to exercise outside of rehab.  Review guidelines for sex with heart disease. Flowsheet Row CARDIAC REHAB PHASE II EXERCISE from 09/05/2016 in [facility] CARDIAC REHAB  Date  08/22/16  Instruction Review Code  2- meets goals/outcomes      Cardiac Drugs I:  -Group instruction provided by verbal instruction and written materials to support subject.  Instructor reviews cardiac drug classes: antiplatelets, anticoagulants, beta blockers, and statins.  Instructor discusses reasons, side effects, and lifestyle considerations for each drug class.   Cardiac Drugs II:  -Group instruction provided by verbal instruction and written materials to support subject.  Instructor reviews cardiac drug classes: angiotensin converting enzyme inhibitors (ACE-I), angiotensin II receptor blockers (ARBs),  nitrates, and calcium channel blockers.  Instructor discusses reasons, side effects, and lifestyle considerations for each drug class.   Anatomy and Physiology of the Circulatory System:  -Group instruction provided by verbal instruction, video, and written materials to support subject.  Reviews functional anatomy of heart, how it relates to various diagnoses, and what role the heart plays in the overall system. Flowsheet Row CARDIAC REHAB PHASE II EXERCISE from 09/05/2016 in [facility] CARDIAC REHAB  Date  08/08/16  Instruction Review Code  2- meets goals/outcomes      Knowledge Questionnaire Score:   Core Components/Risk Factors/Patient Goals at Admission:   Core Components/Risk Factors/Patient Goals Review:      Goals and Risk Factor Review    Row Name 08/13/16 1632             Core Components/Risk Factors/Patient Goals Review   Personal Goals Review Other;Increase Strength and Stamina       Review Discussed home exercise to assist with improving cardiovascular fitness.        Expected Outcomes Pt will exercise 2-3x/week at home           Core Components/Risk Factors/Patient Goals at Discharge (Final Review):      Goals and Risk Factor Review - 08/13/16 1632      Core Components/Risk Factors/Patient Goals Review   Personal Goals Review Other;Increase  Strength and Stamina   Review Discussed home exercise to assist with improving cardiovascular fitness.    Expected Outcomes Pt will exercise 2-3x/week at home       ITP Comments:     ITP Comments    Row Name 09/07/16 1442           ITP Comments attended Hypertension education lecture          Comments: Pt is not making expected progress toward personal goals after completing 16 sessions due to frequent absences.  Most recent absence of family travel complicated by detached retina.  Pt will most likely benefit from withdrawing from program at this time and resume when medically stable.   Recommend continued exercise as tolerated and increase  life style modifications  including  stress management and relaxation techniques to decrease cardiac risk profile.

## 2016-10-10 NOTE — Progress Notes (Signed)
Cardiac Individual Treatment Plan  Patient Details  Name: Victoria Delgado MRN: [id number] Date of Birth: Aug 19, 1941 Referring Provider:   Flowsheet Row CARDIAC REHAB PHASE II ORIENTATION from 07/24/2016 in [facility] CARDIAC REHAB  Referring Provider  Angelina Sheriff MD      Initial Encounter Date:  Flowsheet Row CARDIAC REHAB PHASE II ORIENTATION from 07/24/2016 in [facility] CARDIAC REHAB  Date  07/26/16  Referring Provider  Angelina Sheriff MD      Visit Diagnosis: NSTEMI (non-ST elevated myocardial infarction) [facility])  Stented coronary artery  Patient's Home Medications on Admission:  Current Outpatient Prescriptions:  .  acetaminophen (TYLENOL) 500 MG tablet, Take 500 mg by mouth every 6 (six) hours as needed for mild pain., Disp: , Rfl:  .  aspirin EC 81 MG tablet, Take 81 mg by mouth daily., Disp: , Rfl:  .  CARTIA XT 240 MG 24 hr capsule, TAKE 1 CAPSULE(240 MG) BY MOUTH AT BEDTIME, Disp: 90 capsule, Rfl: 3 .  furosemide (LASIX) 40 MG tablet, TAKE 1 TABLET BY MOUTH EVERY DAY WITH POTASSIUM PILL, Disp: 90 tablet, Rfl: 2 .  nitroGLYCERIN (NITROSTAT) 0.4 MG SL tablet, Place 1 tablet (0.4 mg total) under the tongue every 5 (five) minutes as needed for chest pain., Disp: 25 tablet, Rfl: 3 .  potassium chloride (K-DUR,KLOR-CON) 10 MEQ tablet, Take 10 mEq by mouth daily. , Disp: , Rfl:  .  ranitidine (ZANTAC) 150 MG tablet, Take 150 mg by mouth 2 (two) times daily., Disp: , Rfl:  .  rosuvastatin (CRESTOR) 20 MG tablet, Take 1 tablet (20 mg total) by mouth daily., Disp: 90 tablet, Rfl: 3 .  simvastatin (ZOCOR) 40 MG tablet, Take 1 tablet (40 mg total) by mouth daily., Disp: 90 tablet, Rfl: 3 .  TOPROL XL 50 MG 24 hr tablet, TAKE 1 TABLET BY MOUTH EVERY MORNING AND 1/2 TABLET EVERY EVENING, Disp: 135 tablet, Rfl: 2  Past Medical History: Past Medical History:  Diagnosis Date  . Arthritis    osteoarthritis-hips,knees, back, hands  . Asymmetric septal  hypertrophy (HCC)   . Atrial fibrillation (HCC)   . CAD (coronary artery disease)    2 drug-eluting stents placed in the circumflex leading into a marginal.  May 2017.  [name]  . Cataract    corrective surgery done  . Fatty liver   . Gallstones   . GERD (gastroesophageal reflux disease)   . Heart palpitations   . Hepatitis    hepatitis A; past hx.,many yrs ago ? food source  . Hernia, hiatal   . Hyperlipidemia   . Hyperlipidemia   . Hypertension   . Obesity   . Sleep apnea    CPAP    Tobacco Use: History  Smoking Status  . Former Smoker  Smokeless Tobacco  . Never Used    Comment: QUIT IN 1990    Labs: Recent Review Flowsheet Data    Labs for ITP Cardiac and Pulmonary Rehab Latest Ref Rng & Units 03/05/2013 04/20/2014 09/12/2015 02/24/2016 07/03/2016   Cholestrol 125 - 200 mg/dL 657(Q) 469(G) 295 - 284   LDLCALC <130 mg/dL 132(G) 401(U) 272 - 85   HDL >=46 mg/dL 53(G) 64(Q) 03(K) - 74(Q)   Trlycerides <150 mg/dL 595(G) 387(F) 643(P) - 185(H)   TCO2 0 - 100 mmol/L - - - 24 -      Capillary Blood Glucose: No results found for: GLUCAP   Exercise Target Goals:    Exercise Program Goal: Individual  exercise prescription set with THRR, safety & activity barriers. Participant demonstrates ability to understand and report RPE using BORG scale, to self-measure pulse accurately, and to acknowledge the importance of the exercise prescription.  Exercise Prescription Goal: Starting with aerobic activity 30 plus minutes a day, 3 days per week for initial exercise prescription. Provide home exercise prescription and guidelines that participant acknowledges understanding prior to discharge.  Activity Barriers & Risk Stratification:     Activity Barriers & Cardiac Risk Stratification - 07/24/16 1654      Activity Barriers & Cardiac Risk Stratification   Activity Barriers Back Problems;Arthritis;History of Falls   Cardiac Risk Stratification High      6 Minute Walk:      6 Minute Walk    Row Name 07/24/16 1722 07/26/16 1744 07/26/16 1752     6 Minute Walk   Phase  - Initial  -   Distance 963 feet  -  -   Walk Time 6 minutes  -  -   # of Rest Breaks  - 0  -   MPH  - 1.82  -   METS  - 0.9  -   RPE 12  -  -   VO2 Peak  - 3.2  -   Symptoms  - No Yes (comment)   Comments  -  - c/o chronic B hip pain during the walk test. Used a rolling walker for stability    Resting HR 61 bpm  -  -   Resting BP 143/64  -  -   Max Ex. HR 72 bpm  -  -   Max Ex. BP 122/70  -  -   2 Minute Post BP 102/60  -  -      Initial Exercise Prescription:     Initial Exercise Prescription - 07/26/16 1700      Date of Initial Exercise RX and Referring Provider   Date 07/26/16   Referring Provider Angelina Sheriff MD     NuStep   Level 1   Minutes 30   METs 1.5     Prescription Details   Frequency (times per week) 3   Duration Progress to 30 minutes of continuous aerobic without signs/symptoms of physical distress     Intensity   THRR 40-80% of Max Heartrate 66-118   Ratings of Perceived Exertion 11-13   Perceived Dyspnea 0-4     Progression   Progression Continue to progress workloads to maintain intensity without signs/symptoms of physical distress.     Resistance Training   Training Prescription Yes   Weight 1lb   Reps 10-12      Perform Capillary Blood Glucose checks as needed.  Exercise Prescription Changes:     Exercise Prescription Changes    Row Name 08/13/16 1600             Exercise Review   Progression Yes         Response to Exercise   Blood Pressure (Admit) 124/72       Blood Pressure (Exercise) 132/70       Blood Pressure (Exit) 106/70       Heart Rate (Admit) 59 bpm       Heart Rate (Exercise) 80 bpm       Heart Rate (Exit) 53 bpm       Rating of Perceived Exertion (Exercise) 12       Comments Reviewed HEP on 08/13/16       Duration Progress to 30  minutes of continuous aerobic without signs/symptoms of physical distress        Intensity THRR unchanged         Progression   Average METs 1.7         Resistance Training   Training Prescription Yes       Weight 2lbs       Reps 10-12         NuStep   Level 1       Minutes 30       METs 1.7         Home Exercise Plan   Plans to continue exercise at Home  Reviewed HEP on 08/13/16. See progress note       Frequency Add 2 additional days to program exercise sessions.          Exercise Comments:     Exercise Comments    Row Name 08/13/16 1632           Exercise Comments Reviewed METs and goals. Pt is tolerating exercise well; will continue to monitor exercise progression.          Discharge Exercise Prescription (Final Exercise Prescription Changes):     Exercise Prescription Changes - 08/13/16 1600      Exercise Review   Progression Yes     Response to Exercise   Blood Pressure (Admit) 124/72   Blood Pressure (Exercise) 132/70   Blood Pressure (Exit) 106/70   Heart Rate (Admit) 59 bpm   Heart Rate (Exercise) 80 bpm   Heart Rate (Exit) 53 bpm   Rating of Perceived Exertion (Exercise) 12   Comments Reviewed HEP on 08/13/16   Duration Progress to 30 minutes of continuous aerobic without signs/symptoms of physical distress   Intensity THRR unchanged     Progression   Average METs 1.7     Resistance Training   Training Prescription Yes   Weight 2lbs   Reps 10-12     NuStep   Level 1   Minutes 30   METs 1.7     Home Exercise Plan   Plans to continue exercise at Home  Reviewed HEP on 08/13/16. See progress note   Frequency Add 2 additional days to program exercise sessions.      Nutrition:  Target Goals: Understanding of nutrition guidelines, daily intake of sodium 1500mg , cholesterol 200mg , calories 30% from fat and 7% or less from saturated fats, daily to have 5 or more servings of fruits and vegetables.  Biometrics:    Nutrition Therapy Plan and Nutrition Goals:     Nutrition Therapy & Goals - 09/03/16 1609       Nutrition Therapy   Diet Therapeutic Lifestyle Changes     Personal Nutrition Goals   Personal Goal #1 1-2 lb wt loss/week to a goal of 6-24 lb wt loss     Intervention Plan   Intervention Prescribe, educate and counsel regarding individualized specific dietary modifications aiming towards targeted core components such as weight, hypertension, lipid management, diabetes, heart failure and other comorbidities.   Expected Outcomes Short Term Goal: Understand basic principles of dietary content, such as calories, fat, sodium, cholesterol and nutrients.;Long Term Goal: Adherence to prescribed nutrition plan.      Nutrition Discharge: Nutrition Scores:     Nutrition Assessments - 09/03/16 1606      MEDFICTS Scores   Pre Score 19      Nutrition Goals Re-Evaluation:   Psychosocial: Target Goals: Acknowledge presence or absence of depression, maximize coping  skills, provide positive support system. Participant is able to verbalize types and ability to use techniques and skills needed for reducing stress and depression.  Initial Review & Psychosocial Screening:     Initial Psych Review & Screening - 07/24/16 1703      Initial Review   Current issues with Current Stress Concerns   Source of Stress Concerns Chronic Illness;Unable to participate in former interests or hobbies     Family Dynamics   Good Support System? Yes     Barriers   Psychosocial barriers to participate in program There are no identifiable barriers or psychosocial needs.     Screening Interventions   Interventions Encouraged to exercise      Quality of Life Scores:     Quality of Life - 08/29/16 1701      Quality of Life Scores   GLOBAL Pre --  reviewed with pt who reports major concern is health related physical decline and inablity to participate in tasks as prior to cardiac event. pt health related anxiety due to cardiac event occurring while on vacation.       PHQ-9: Recent Review Flowsheet Data     Depression screen [facility] 2/9 07/30/2016 04/20/2014 03/05/2013   Decreased Interest - 0 0   Down, Depressed, Hopeless 1  0 1   PHQ - 2 Score 1 0 1      Psychosocial Evaluation and Intervention:     Psychosocial Evaluation - 08/17/16 1654      Psychosocial Evaluation & Interventions   Continued Psychosocial Services Needed --      Psychosocial Re-Evaluation:     Psychosocial Re-Evaluation    Row Name 09/12/16 1134 10/10/16 1803           Psychosocial Re-Evaluation   Interventions Encouraged to attend Cardiac Rehabilitation for the exercise  -      Comments pt health related anxiety is diminishing. pt feels more comfortable and confident with travel.  pt is looking forward to traveling with family to [place] this week. pt encouraged to pace herself and take rest breaks when necessary.  pt reports she and her family have noticed her increased strength and stamina. pt with frequent absences due to scheduled family vacation and medical complications following her trip.       Continued Psychosocial Services Needed No  no psychosocial needs identified, no inteventions necessary  No         Vocational Rehabilitation: Provide vocational rehab assistance to qualifying candidates.   Vocational Rehab Evaluation & Intervention:     Vocational Rehab - 07/24/16 1655      Initial Vocational Rehab Evaluation & Intervention   Assessment shows need for Vocational Rehabilitation No      Education: Education Goals: Education classes will be provided on a weekly basis, covering required topics. Participant will state understanding/return demonstration of topics presented.  Learning Barriers/Preferences:     Learning Barriers/Preferences - 07/24/16 1226      Learning Barriers/Preferences   Learning Barriers Sight;Hearing   Learning Preferences Written Material      Education Topics: Count Your Pulse:  -Group instruction provided by verbal instruction, demonstration, patient  participation and written materials to support subject.  Instructors address importance of being able to find your pulse and how to count your pulse when at home without a heart monitor.  Patients get hands on experience counting their pulse with staff help and individually. Flowsheet Row CARDIAC REHAB PHASE II EXERCISE from 09/05/2016 in [place] CARDIAC [facility]  Date  08/03/16  Educator  Gladstone LighterMaria Whitaker, RN  Instruction Review Code  2- meets goals/outcomes      Heart Attack, Angina, and Risk Factor Modification:  -Group instruction provided by verbal instruction, video, and written materials to support subject.  Instructors address signs and symptoms of angina and heart attacks.    Also discuss risk factors for heart disease and how to make changes to improve heart health risk factors.   Functional Fitness:  -Group instruction provided by verbal instruction, demonstration, patient participation, and written materials to support subject.  Instructors address safety measures for doing things around the house.  Discuss how to get up and down off the floor, how to pick things up properly, how to safely get out of a chair without assistance, and balance training.   Meditation and Mindfulness:  -Group instruction provided by verbal instruction, patient participation, and written materials to support subject.  Instructor addresses importance of mindfulness and meditation practice to help reduce stress and improve awareness.  Instructor also leads participants through a meditation exercise.    Stretching for Flexibility and Mobility:  -Group instruction provided by verbal instruction, patient participation, and written materials to support subject.  Instructors lead participants through series of stretches that are designed to increase flexibility thus improving mobility.  These stretches are additional exercise for major muscle groups that are typically performed during regular warm  up and cool down.   Hands Only CPR Anytime:  -Group instruction provided by verbal instruction, video, patient participation and written materials to support subject.  Instructors co-teach with [facility] video for hands only CPR.  Participants get hands on experience with mannequins.   Nutrition I class: Heart Healthy Eating:  -Group instruction provided by PowerPoint slides, verbal discussion, and written materials to support subject matter. The instructor gives an explanation and review of the Therapeutic Lifestyle Changes diet recommendations, which includes a discussion on lipid goals, dietary fat, sodium, fiber, plant stanol/sterol esters, sugar, and the components of a well-balanced, healthy diet. Flowsheet Row CARDIAC REHAB [facility]  Date  09/04/16  Educator  RD  Instruction Review Code  2- meets goals/outcomes      Nutrition II class: Lifestyle Skills:  -Group instruction provided by PowerPoint slides, verbal discussion, and written materials to support subject matter. The instructor gives an explanation and review of label reading, grocery shopping for heart health, heart healthy recipe modifications, and ways to make healthier choices when eating out. Flowsheet Row CARDIAC REHAB PHASE [facility]  Date  09/03/16  Educator  RD  Instruction Review Code  Not applicable [class handouts given]      Diabetes Question & Answer:  -Group instruction provided by PowerPoint slides, verbal discussion, and written materials to support subject matter. The instructor gives an explanation and review of diabetes co-morbidities, pre- and post-prandial blood glucose goals, pre-exercise blood glucose goals, signs, symptoms, and treatment of hypoglycemia and hyperglycemia, and foot care basics.   Diabetes Blitz:  -Group instruction provided by PowerPoint slides, verbal  discussion, and written materials to support subject matter. The instructor gives an explanation and review of the physiology behind type 1 and type 2 diabetes, diabetes medications and rational behind using different medications, pre- and post-prandial blood glucose recommendations and Hemoglobin A1c goals, diabetes diet, and exercise including blood glucose guidelines for exercising safely.    Portion Distortion:  -Group instruction provided by PowerPoint slides,  verbal discussion, written materials, and food models to support subject matter. The instructor gives an explanation of serving size versus portion size, changes in portions sizes over the last 20 years, and what consists of a serving from each food group. Flowsheet Row CARDIAC REHAB PHASE II EXERCISE from 09/05/2016 in [facility] CARDIAC REHAB  Date  09/05/16  Educator  RD  Instruction Review Code  2- meets goals/outcomes      Stress Management:  -Group instruction provided by verbal instruction, video, and written materials to support subject matter.  Instructors review role of stress in heart disease and how to cope with stress positively.   Flowsheet Row CARDIAC REHAB PHASE II EXERCISE from 09/05/2016 in [facility] CARDIAC REHAB  Date  08/29/16  Instruction Review Code  2- meets goals/outcomes      Exercising on Your Own:  -Group instruction provided by verbal instruction, power point, and written materials to support subject.  Instructors discuss benefits of exercise, components of exercise, frequency and intensity of exercise, and end points for exercise.  Also discuss use of nitroglycerin and activating EMS.  Review options of places to exercise outside of rehab.  Review guidelines for sex with heart disease. Flowsheet Row CARDIAC REHAB PHASE II EXERCISE from 09/05/2016 in [facility] CARDIAC REHAB  Date  08/22/16  Instruction Review Code  2- meets goals/outcomes       Cardiac Drugs I:  -Group instruction provided by verbal instruction and written materials to support subject.  Instructor reviews cardiac drug classes: antiplatelets, anticoagulants, beta blockers, and statins.  Instructor discusses reasons, side effects, and lifestyle considerations for each drug class.   Cardiac Drugs II:  -Group instruction provided by verbal instruction and written materials to support subject.  Instructor reviews cardiac drug classes: angiotensin converting enzyme inhibitors (ACE-I), angiotensin II receptor blockers (ARBs), nitrates, and calcium channel blockers.  Instructor discusses reasons, side effects, and lifestyle considerations for each drug class.   Anatomy and Physiology of the Circulatory System:  -Group instruction provided by verbal instruction, video, and written materials to support subject.  Reviews functional anatomy of heart, how it relates to various diagnoses, and what role the heart plays in the overall system. Flowsheet Row CARDIAC REHAB PHASE II EXERCISE from 09/05/2016 in [facility] CARDIAC REHAB  Date  08/08/16  Instruction Review Code  2- meets goals/outcomes      Knowledge Questionnaire Score:     Knowledge Questionnaire Score - 07/26/16 1748      Knowledge Questionnaire Score   Pre Score 23/24      Core Components/Risk Factors/Patient Goals at Admission:     Personal Goals and Risk Factors at Admission - 07/24/16 1707      Core Components/Risk Factors/Patient Goals on Admission   Hypertension Yes   Intervention Provide education on lifestyle modifcations including regular physical activity/exercise, weight management, moderate sodium restriction and increased consumption of fresh fruit, vegetables, and low fat dairy, alcohol moderation, and smoking cessation.;Monitor prescription use compliance.   Expected Outcomes Short Term: Continued assessment and intervention until BP is < 140/49mm HG in hypertensive  participants. < 130/31mm HG in hypertensive participants with diabetes, heart failure or chronic kidney disease.;Long Term: Maintenance of blood pressure at goal levels.   Lipids Yes   Intervention Provide education and support for participant on nutrition & aerobic/resistive exercise along with prescribed medications to achieve LDL 70mg , HDL >40mg .   Expected Outcomes Short Term: Participant states understanding of desired cholesterol  values and is compliant with medications prescribed. Participant is following exercise prescription and nutrition guidelines.;Long Term: Cholesterol controlled with medications as prescribed, with individualized exercise RX and with personalized nutrition plan. Value goals: LDL < 70mg , HDL > 40 mg.      Core Components/Risk Factors/Patient Goals Review:      Goals and Risk Factor Review    Row Name 08/13/16 1632             Core Components/Risk Factors/Patient Goals Review   Personal Goals Review Other;Increase Strength and Stamina       Review Discussed home exercise to assist with improving cardiovascular fitness.        Expected Outcomes Pt will exercise 2-3x/week at home           Core Components/Risk Factors/Patient Goals at Discharge (Final Review):      Goals and Risk Factor Review - 08/13/16 1632      Core Components/Risk Factors/Patient Goals Review   Personal Goals Review Other;Increase Strength and Stamina   Review Discussed home exercise to assist with improving cardiovascular fitness.    Expected Outcomes Pt will exercise 2-3x/week at home       ITP Comments:     ITP Comments    Row Name 07/24/16 1221 09/07/16 1442         ITP Comments Dr. Armanda Magic, Medical Director  attended Hypertension education lecture         Comments: Pt is not making expected progress toward personal goals after completing 16 sessions, due to frequent absences.  Recommend continued exercise, as tolerated  and life style modification education  including  stress management and relaxation techniques to decrease cardiac risk profile.

## 2016-10-12 ENCOUNTER — Encounter (HOSPITAL_COMMUNITY): Payer: Medicare Other

## 2016-10-15 ENCOUNTER — Encounter (HOSPITAL_COMMUNITY): Admission: RE | Admit: 2016-10-15 | Payer: Medicare Other | Source: Ambulatory Visit

## 2016-10-17 ENCOUNTER — Encounter (HOSPITAL_COMMUNITY): Payer: Medicare Other

## 2016-10-19 ENCOUNTER — Encounter (HOSPITAL_COMMUNITY)
Admission: RE | Admit: 2016-10-19 | Discharge: 2016-10-19 | Disposition: A | Discharge status: Home / Self Care | Payer: Medicare Other | Source: Ambulatory Visit | Attending: Cardiology | Admitting: Cardiology

## 2016-10-19 DIAGNOSIS — Z955 Presence of coronary angioplasty implant and graft: Secondary | ICD-10-CM

## 2016-10-19 DIAGNOSIS — I214 Non-ST elevation (NSTEMI) myocardial infarction: Secondary | ICD-10-CM | POA: Diagnosis present

## 2016-10-19 NOTE — Progress Notes (Signed)
Daily Session Note  Patient Details  Name: Victoria Delgado MRN: [id number] Date of Birth: [birth year] Referring Provider:   Flowsheet Row CARDIAC REHAB PHASE II ORIENTATION from 07/24/2016 in [place]  Referring Provider  Marijo File MD      Encounter Date: 10/19/2016  Check In:     Session Check In - 10/19/16 1421      Check-In   Location [facility]   Staff Present Cleda Mccreedy, MS, Exercise Physiologist;Carlette Wilber Oliphant, RN, Marga Melnick, RN, BSN;Amber Fair, MS, ACSM RCEP, Exercise Physiologist;Joann Rion, RN, BSN   Supervising physician immediately available to respond to emergencies [facility] immediately available   Physician(s) Dr. Grandville Silos    Medication changes reported     No   Fall or balance concerns reported    No   Warm-up and Cool-down Performed as group-led instruction   Resistance Training Performed Yes   VAD Patient? No     Pain Assessment   Currently in Pain? No/denies      Capillary Blood Glucose: No results found for this or any previous visit (from the past 24 hour(s)).   Goals Met:  Exercise tolerated well  Goals Unmet:  Not Applicable  Comments: pt returned to exercise today after extended absence for family vacation and medical complications following her trip.  Pt tolerated exercise without difficulty.   Dr. Fransico Him is Medical Director for Cardiac Rehab at [facility].

## 2016-10-22 ENCOUNTER — Encounter (HOSPITAL_COMMUNITY)
Admission: RE | Admit: 2016-10-22 | Discharge: 2016-10-22 | Disposition: A | Discharge status: Home / Self Care | Payer: Medicare Other | Source: Ambulatory Visit | Attending: Cardiology | Admitting: Cardiology

## 2016-10-22 DIAGNOSIS — I214 Non-ST elevation (NSTEMI) myocardial infarction: Secondary | ICD-10-CM | POA: Diagnosis not present

## 2016-10-22 DIAGNOSIS — Z955 Presence of coronary angioplasty implant and graft: Secondary | ICD-10-CM

## 2016-10-24 ENCOUNTER — Encounter: Payer: Self-pay | Admitting: *Deleted

## 2016-10-24 ENCOUNTER — Encounter (HOSPITAL_COMMUNITY)
Admission: RE | Admit: 2016-10-24 | Discharge: 2016-10-24 | Disposition: A | Discharge status: Home / Self Care | Payer: Medicare Other | Source: Ambulatory Visit | Attending: Cardiology | Admitting: Cardiology

## 2016-10-24 DIAGNOSIS — I214 Non-ST elevation (NSTEMI) myocardial infarction: Secondary | ICD-10-CM | POA: Diagnosis not present

## 2016-10-24 DIAGNOSIS — Z955 Presence of coronary angioplasty implant and graft: Secondary | ICD-10-CM

## 2016-10-26 ENCOUNTER — Encounter (HOSPITAL_COMMUNITY): Payer: Medicare Other

## 2016-10-28 ENCOUNTER — Encounter: Payer: Self-pay | Admitting: Cardiology

## 2016-10-28 NOTE — Progress Notes (Signed)
HPI The patient presents for follow up of medications.  She has atrial fibrillation which she ultimately thinks was related to a steroid injection. This was in 2008. At that time she was found to have asymmetric septal hypertrophy. This has been followed conservatively.  Echocardiogram did demonstrate concentric LV hypertrophy which was moderate and moderate LAE.  In May she was in the hospital with CAD.  She was admitted to [facility] on 5/24 after waking up with left arm pain.  She had positive troponin with  PCI to the mid LCX and 1st DX. She is on DAPT with aspirin/Effient. She had 2 drug-eluting stents placed in the circumflex leading into a marginal. This was apparently somewhat small circumflex. She also had a diagonal had a drug-eluting stent placed. She had a preserved ejection fraction. An echo in our office showed well preserved ejection fraction. There was some moderate aortic stenosis. There was no mention of hypertrophic obstruction.    Since I last saw her she's continue have problems with a retinal tear. She wasn't doing cardiac rehabilitation but is about to start this. Her biggest problem has been intolerance of her Crestor. She says it causes a lot of GI problems. She had the same problem was Zocor. She has a lot of palpitations which she thinks is related to the Crestor but says it could be related to the GI problem she gets the Crestor. She stopped taking her Crestor about a week ago and her palpitations are better. He does have some dyspnea with exertion. However, she's not having any PND or orthopnea. She's not having any palpitations, presyncope or syncope.  Allergies  Allergen Reactions  . Other Other (See Comments)    Gas bubble [id number] has green bracelet **  . Atorvastatin Other (See Comments)    myalgias  . Codeine Nausea Only  . Kenalog [Triamcinolone Acetonide] Other (See Comments)    Had A.fib after she received the injection."Steroid meds"  . Relafen [Nabumetone]  Other (See Comments)    Edema   . Penicillins Rash    40 years ago, injection site was red and swollen.   Has patient had a PCN reaction causing immediate rash, facial/tongue/throat swelling, SOB or lightheadedness with hypotension: YES Has patient had a PCN reaction causing severe rash involving mucus membranes or skin necrosis: NO Has patient had a PCN reaction that required hospitalization. No  Has patient had a PCN reaction occurring within the last 10 years: NO If all of the above answers are "NO", then may proceed with Cephalosporin use.    . Tramadol Other (See Comments)    Auditory halllucinations    Current Outpatient Prescriptions  Medication Sig Dispense Refill  . acetaminophen (TYLENOL) 500 MG tablet Take 500 mg by mouth every 6 (six) hours as needed for mild pain.    Marland Kitchen aspirin EC 81 MG tablet Take 81 mg by mouth daily.    Marland Kitchen CARTIA XT 240 MG 24 hr capsule TAKE 1 CAPSULE(240 MG) BY MOUTH AT BEDTIME 90 capsule 3  . furosemide (LASIX) 40 MG tablet TAKE 1 TABLET BY MOUTH EVERY DAY WITH POTASSIUM PILL 90 tablet 2  . nitroGLYCERIN (NITROSTAT) 0.4 MG SL tablet Place 1 tablet (0.4 mg total) under the tongue every 5 (five) minutes as needed for chest pain. 25 tablet 3  . potassium chloride (K-DUR,KLOR-CON) 10 MEQ tablet Take 10 mEq by mouth daily.     . prasugrel (EFFIENT) 10 MG TABS tablet Take 10 mg by mouth daily.    Marland Kitchen  ranitidine (ZANTAC) 150 MG tablet Take 150 mg by mouth 2 (two) times daily.    . rosuvastatin (CRESTOR) 20 MG tablet Take 20 mg by mouth every 3 (three) days.     No current facility-administered medications for this visit.     Past Medical History:  Diagnosis Date  . Arthritis    osteoarthritis-hips,knees, back, hands  . Asymmetric septal hypertrophy (HCC)   . Atrial fibrillation (HCC)   . CAD (coronary artery disease)    2 drug-eluting stents placed in the circumflex leading into a marginal.  [place]  [facility]  . Cataract    corrective surgery done   . Fatty liver   . Gallstones   . GERD (gastroesophageal reflux disease)   . Heart palpitations   . Hepatitis    hepatitis A; past hx.,many yrs ago ? food source  . Hernia, hiatal   . Hyperlipidemia   . Hypertension   . Obesity   . Sleep apnea    CPAP    Past Surgical History:  Procedure Laterality Date  . APPENDECTOMY  2004  . CATARACT EXTRACTION W/ INTRAOCULAR LENS IMPLANT     both eyes  . CHOLECYSTECTOMY    . NM MYOCAR PERF WALL MOTION  08/15/04   No ischemia  . RETINAL DETACHMENT SURGERY Bilateral    remains with slight hazy vision with lights  . TOTAL HIP ARTHROPLASTY Right 08/04/2014   Procedure: RIGHT TOTAL HIP ARTHROPLASTY ANTERIOR APPROACH;  Surgeon: Loanne DrillingFrank Aluisio V, MD;  Location: [facility];  Service: Orthopedics;  Laterality: Right;  . TOTAL HIP ARTHROPLASTY Left 01/05/2015   Procedure: LEFT TOTAL HIP ARTHROPLASTY ANTERIOR APPROACH;  Surgeon: Loanne DrillingFrank Aluisio V, MD;  Location: [facility];  Service: Orthopedics;  Laterality: Left;  . US ECHOCARDIOGRAPHY  06/25/2012   Moderate ASH,LV hyperdynamic,LA is mod. dilated,trace MR,TR,AI    ROS:    As stated in the HPI and negative for all other systems.  PHYSICAL EXAM BP 128/72   Pulse 77   Ht 5\' 8"  (1.727 m)   Wt 287 lb 6.4 oz (130.4 kg)   BMI 43.70 kg/m  GENERAL:  Well appearing HEENT:  Pupils equal round and reactive, fundi not visualized, oral mucosa unremarkable NECK:  No jugular venous distention, waveform within normal limits, carotid upstroke brisk and symmetric, no bruits, no thyromegaly LYMPHATICS:  No cervical, inguinal adenopathy LUNGS:  Clear to auscultation bilaterally BACK:  No CVA tenderness CHEST:  Unremarkable HEART:  PMI not displaced or sustained,S1 and S2 within normal limits, no S3, no S4, no clicks, no rubs, 3/6 systolic murmur radiating out the aortic outflow tract and into the carotids not increasing with a strain phase of Valsalva, no diastolic murmurs ABD:  Flat, positive bowel sounds normal in frequency  in pitch, no bruits, no rebound, no guarding, no midline pulsatile mass, no hepatomegaly, no splenomegaly, obese EXT:  2 plus pulses throughout, trace edema, no cyanosis no clubbing  Lab Results  Component Value Date   CHOL 158 07/03/2016   TRIG 185 (H) 07/03/2016   HDL 36 (L) 07/03/2016   LDLCALC 85 07/03/2016    ASSESSMENT AND PLAN  CAD:  She is restarting cardiac rehab. She will otherwise continue the meds as listed.  She will let me know if her dyspnea gets worse rather than better.  Hypertrophic cardiomyopathy - I do not see evidence of this on the recent echo.  No change in therapy.   AS - She does have some moderate aortic stenosis.  I  will follow this clinically.  I will repeat an echo next year.  Dyslipidemia - She will take her Crestor every third day and if she is tolerating this in about 8 weeks we will get a lipid profile and liver enzymes. She does not want to think about a PCSK9 inhibitor.  Palpitations - Right now these are better but she'll let me know if they get worse.  Hypertension  The blood pressure is at target. No change in medications is indicated. We will continue with therapeutic lifestyle changes (TLC).  Dyspnea I'm going to check a BNP level.

## 2016-10-29 ENCOUNTER — Encounter (HOSPITAL_COMMUNITY)
Admission: RE | Admit: 2016-10-29 | Discharge: 2016-10-29 | Disposition: A | Discharge status: Home / Self Care | Payer: Medicare Other | Source: Ambulatory Visit | Attending: Cardiology | Admitting: Cardiology

## 2016-10-29 DIAGNOSIS — I214 Non-ST elevation (NSTEMI) myocardial infarction: Secondary | ICD-10-CM | POA: Diagnosis not present

## 2016-10-29 DIAGNOSIS — Z955 Presence of coronary angioplasty implant and graft: Secondary | ICD-10-CM

## 2016-10-30 ENCOUNTER — Ambulatory Visit (INDEPENDENT_AMBULATORY_CARE_PROVIDER_SITE_OTHER): Payer: Medicare Other | Admitting: Cardiology

## 2016-10-30 ENCOUNTER — Encounter: Payer: Self-pay | Admitting: Cardiology

## 2016-10-30 VITALS — BP 128/72 | HR 77 | Ht 68.0 in | Wt 287.4 lb

## 2016-10-30 DIAGNOSIS — E782 Mixed hyperlipidemia: Secondary | ICD-10-CM | POA: Diagnosis not present

## 2016-10-30 DIAGNOSIS — I10 Essential (primary) hypertension: Secondary | ICD-10-CM

## 2016-10-30 DIAGNOSIS — I2510 Atherosclerotic heart disease of native coronary artery without angina pectoris: Secondary | ICD-10-CM

## 2016-10-30 DIAGNOSIS — R0602 Shortness of breath: Secondary | ICD-10-CM | POA: Diagnosis not present

## 2016-10-30 DIAGNOSIS — I350 Nonrheumatic aortic (valve) stenosis: Secondary | ICD-10-CM

## 2016-10-30 NOTE — Patient Instructions (Signed)
Medication Instructions:  TAKE Crestor every 3 days  Labwork: BNP Today  Testing/Procedures: None Ordered  Follow-Up: Your physician wants you to follow-up in: 3 Months. You will receive a reminder letter in the mail two months in advance. If you don't receive a letter, please call our office to schedule the follow-up appointment.   Any Other Special Instructions Will Be Listed Below (If Applicable).     If you need a refill on your cardiac medications before your next appointment, please call your pharmacy.

## 2016-10-31 ENCOUNTER — Encounter (HOSPITAL_COMMUNITY)
Admission: RE | Admit: 2016-10-31 | Discharge: 2016-10-31 | Disposition: A | Discharge status: Home / Self Care | Payer: Medicare Other | Source: Ambulatory Visit | Attending: Cardiology | Admitting: Cardiology

## 2016-10-31 DIAGNOSIS — Z955 Presence of coronary angioplasty implant and graft: Secondary | ICD-10-CM

## 2016-10-31 DIAGNOSIS — I214 Non-ST elevation (NSTEMI) myocardial infarction: Secondary | ICD-10-CM

## 2016-10-31 LAB — BRAIN NATRIURETIC PEPTIDE: Brain Natriuretic Peptide: 80.9 pg/mL (ref <100)

## 2016-11-02 ENCOUNTER — Encounter (HOSPITAL_COMMUNITY)
Admission: RE | Admit: 2016-11-02 | Discharge: 2016-11-02 | Disposition: A | Discharge status: Home / Self Care | Payer: Medicare Other | Source: Ambulatory Visit | Attending: Cardiology | Admitting: Cardiology

## 2016-11-02 DIAGNOSIS — I214 Non-ST elevation (NSTEMI) myocardial infarction: Secondary | ICD-10-CM | POA: Insufficient documentation

## 2016-11-02 DIAGNOSIS — Z955 Presence of coronary angioplasty implant and graft: Secondary | ICD-10-CM

## 2016-11-02 NOTE — Progress Notes (Signed)
Pt c/o left arm discomfort off and on since cardiac rehab exercise 2 days ago.  Pt describes as a dull ache around wrist, forearm and bicep area. Pt reports symptoms come and go, are sensitive to touch and movement, however are also associated with "jolt" of pain that awakens her at night. Pt reports symptoms are self relieved and not brought on my any specific activities.  Pt reports she used arm crank forcefully at last rehab session. Pt states her anginal equivalent was left arm pain however this is not a similar type symptom.  Pt reassured. Pt advised to avoid overexertion of left arm. Pt also instructed to notify MD if symptoms persist or worsen and present to ED for severe unrelieved symptoms.  Pt verbalized understanding.

## 2016-11-05 ENCOUNTER — Encounter (HOSPITAL_COMMUNITY): Payer: Medicare Other

## 2016-11-05 ENCOUNTER — Encounter (HOSPITAL_COMMUNITY)
Admission: RE | Admit: 2016-11-05 | Discharge: 2016-11-05 | Disposition: A | Discharge status: Home / Self Care | Payer: Medicare Other | Source: Ambulatory Visit | Attending: Cardiology | Admitting: Cardiology

## 2016-11-05 DIAGNOSIS — Z955 Presence of coronary angioplasty implant and graft: Secondary | ICD-10-CM

## 2016-11-05 DIAGNOSIS — I214 Non-ST elevation (NSTEMI) myocardial infarction: Secondary | ICD-10-CM

## 2016-11-07 ENCOUNTER — Encounter (HOSPITAL_COMMUNITY)
Admission: RE | Admit: 2016-11-07 | Discharge: 2016-11-07 | Disposition: A | Discharge status: Home / Self Care | Payer: Medicare Other | Source: Ambulatory Visit | Attending: Cardiology | Admitting: Cardiology

## 2016-11-07 DIAGNOSIS — I214 Non-ST elevation (NSTEMI) myocardial infarction: Secondary | ICD-10-CM

## 2016-11-07 DIAGNOSIS — Z955 Presence of coronary angioplasty implant and graft: Secondary | ICD-10-CM

## 2016-11-09 ENCOUNTER — Encounter (HOSPITAL_COMMUNITY)
Admission: RE | Admit: 2016-11-09 | Discharge: 2016-11-09 | Disposition: A | Discharge status: Home / Self Care | Payer: Medicare Other | Source: Ambulatory Visit | Attending: Cardiology | Admitting: Cardiology

## 2016-11-09 NOTE — Progress Notes (Signed)
Cardiac Individual Treatment Plan  Patient Details  Name: Victoria Delgado MRN: [id number] Date of Birth: Oct 20, 1941 Referring Provider:   Flowsheet Row CARDIAC REHAB PHASE II ORIENTATION from 07/24/2016 in [facility] CARDIAC [facility]  Referring Provider  Angelina Sheriff MD      Initial Encounter Date:  Flowsheet Row CARDIAC REHAB PHASE II ORIENTATION from 07/24/2016 in [facility] CARDIAC REHAB  Date  07/26/16  Referring Provider  Angelina Sheriff MD      Visit Diagnosis: No diagnosis found.  Patient's Home Medications on Admission:  Current Outpatient Prescriptions:  .  acetaminophen (TYLENOL) 500 MG tablet, Take 500 mg by mouth every 6 (six) hours as needed for mild pain., Disp: , Rfl:  .  aspirin EC 81 MG tablet, Take 81 mg by mouth daily., Disp: , Rfl:  .  CARTIA XT 240 MG 24 hr capsule, TAKE 1 CAPSULE(240 MG) BY MOUTH AT BEDTIME, Disp: 90 capsule, Rfl: 3 .  furosemide (LASIX) 40 MG tablet, TAKE 1 TABLET BY MOUTH EVERY DAY WITH POTASSIUM PILL, Disp: 90 tablet, Rfl: 2 .  nitroGLYCERIN (NITROSTAT) 0.4 MG SL tablet, Place 1 tablet (0.4 mg total) under the tongue every 5 (five) minutes as needed for chest pain., Disp: 25 tablet, Rfl: 3 .  potassium chloride (K-DUR,KLOR-CON) 10 MEQ tablet, Take 10 mEq by mouth daily. , Disp: , Rfl:  .  prasugrel (EFFIENT) 10 MG TABS tablet, Take 10 mg by mouth daily., Disp: , Rfl:  .  ranitidine (ZANTAC) 150 MG tablet, Take 150 mg by mouth 2 (two) times daily., Disp: , Rfl:  .  rosuvastatin (CRESTOR) 20 MG tablet, Take 20 mg by mouth every 3 (three) days., Disp: , Rfl:   Past Medical History: Past Medical History:  Diagnosis Date  . Arthritis    osteoarthritis-hips,knees, back, hands  . Asymmetric septal hypertrophy (HCC)   . Atrial fibrillation (HCC)   . CAD (coronary artery disease)    2 drug-eluting stents placed in the circumflex leading into a marginal.  May 2017.  [name]  . Cataract    corrective surgery  done  . Fatty liver   . Gallstones   . GERD (gastroesophageal reflux disease)   . Heart palpitations   . Hepatitis    hepatitis A; past hx.,many yrs ago ? food source  . Hernia, hiatal   . Hyperlipidemia   . Hypertension   . Obesity   . Sleep apnea    CPAP    Tobacco Use: History  Smoking Status  . Former Smoker  Smokeless Tobacco  . Never Used    Comment: QUIT IN 1990    Labs: Recent Review Flowsheet Data    Labs for ITP Cardiac and Pulmonary Rehab Latest Ref Rng & Units 03/05/2013 04/20/2014 09/12/2015 02/24/2016 07/03/2016   Cholestrol 125 - 200 mg/dL 409(W) 119(J) 478 - 295   LDLCALC <130 mg/dL 621(H) 086(V) 784 - 85   HDL >=46 mg/dL 69(G) 29(B) 28(U) - 13(K)   Trlycerides <150 mg/dL 440(N) 027(O) 536(U) - 185(H)   TCO2 0 - 100 mmol/L - - - 24 -      Capillary Blood Glucose: No results found for: GLUCAP   Exercise Target Goals:    Exercise Program Goal: Individual exercise prescription set with THRR, safety & activity barriers. Participant demonstrates ability to understand and report RPE using BORG scale, to self-measure pulse accurately, and to acknowledge the importance of the exercise prescription.  Exercise Prescription Goal: Starting with aerobic activity  30 plus minutes a day, 3 days per week for initial exercise prescription. Provide home exercise prescription and guidelines that participant acknowledges understanding prior to discharge.  Activity Barriers & Risk Stratification:     Activity Barriers & Cardiac Risk Stratification - 07/24/16 1654      Activity Barriers & Cardiac Risk Stratification   Activity Barriers Back Problems;Arthritis;History of Falls   Cardiac Risk Stratification High      6 Minute Walk:     6 Minute Walk    Row Name 07/24/16 1722 07/26/16 1744 07/26/16 1752     6 Minute Walk   Phase  - Initial  -   Distance 963 feet  -  -   Walk Time 6 minutes  -  -   # of Rest Breaks  - 0  -   MPH  - 1.82  -   METS  - 0.9  -   RPE  12  -  -   VO2 Peak  - 3.2  -   Symptoms  - No Yes (comment)   Comments  -  - c/o chronic B hip pain during the walk test. Used a rolling walker for stability    Resting HR 61 bpm  -  -   Resting BP 143/64  -  -   Max Ex. HR 72 bpm  -  -   Max Ex. BP 122/70  -  -   2 Minute Post BP 102/60  -  -      Initial Exercise Prescription:     Initial Exercise Prescription - 07/26/16 1700      Date of Initial Exercise RX and Referring Provider   Date 07/26/16   Referring Provider Angelina Sheriff MD     NuStep   Level 1   Minutes 30   METs 1.5     Prescription Details   Frequency (times per week) 3   Duration Progress to 30 minutes of continuous aerobic without signs/symptoms of physical distress     Intensity   THRR 40-80% of Max Heartrate 66-118   Ratings of Perceived Exertion 11-13   Perceived Dyspnea 0-4     Progression   Progression Continue to progress workloads to maintain intensity without signs/symptoms of physical distress.     Resistance Training   Training Prescription Yes   Weight 1lb   Reps 10-12      Perform Capillary Blood Glucose checks as needed.  Exercise Prescription Changes:     Exercise Prescription Changes    Row Name 08/13/16 1600 11/07/16 1000           Exercise Review   Progression Yes Yes        Response to Exercise   Blood Pressure (Admit) 124/72 114/60      Blood Pressure (Exercise) 132/70 120/60      Blood Pressure (Exit) 106/70 104/68      Heart Rate (Admit) 59 bpm 71 bpm      Heart Rate (Exercise) 80 bpm 74 bpm      Heart Rate (Exit) 53 bpm 61 bpm      Rating of Perceived Exertion (Exercise) 12 11      Symptoms  - none      Comments Reviewed HEP on 08/13/16 Reviewed HEP on 08/13/16      Duration Progress to 30 minutes of continuous aerobic without signs/symptoms of physical distress Progress to 30 minutes of continuous aerobic without signs/symptoms of physical distress  Intensity THRR unchanged THRR unchanged         Progression   Average METs 1.7 2.1        Resistance Training   Training Prescription Yes Yes      Weight 2lbs 2lbs      Reps 10-12 10-12        NuStep   Level 1 4      Minutes 30 30      METs 1.7 2.1        Home Exercise Plan   Plans to continue exercise at Home  Reviewed HEP on 08/13/16. See progress note Home  Reviewed HEP on 08/13/16. See progress note      Frequency Add 2 additional days to program exercise sessions. Add 2 additional days to program exercise sessions.         Exercise Comments:     Exercise Comments    Row Name 08/13/16 1632 11/07/16 1052         Exercise Comments Reviewed METs and goals. Pt is tolerating exercise well; will continue to monitor exercise progression. Reviewed METs and goals. Pt is tolerating exercise well; will continue to monitor exercise progression.         Discharge Exercise Prescription (Final Exercise Prescription Changes):     Exercise Prescription Changes - 11/07/16 1000      Exercise Review   Progression Yes     Response to Exercise   Blood Pressure (Admit) 114/60   Blood Pressure (Exercise) 120/60   Blood Pressure (Exit) 104/68   Heart Rate (Admit) 71 bpm   Heart Rate (Exercise) 74 bpm   Heart Rate (Exit) 61 bpm   Rating of Perceived Exertion (Exercise) 11   Symptoms none   Comments Reviewed HEP on 08/13/16   Duration Progress to 30 minutes of continuous aerobic without signs/symptoms of physical distress   Intensity THRR unchanged     Progression   Average METs 2.1     Resistance Training   Training Prescription Yes   Weight 2lbs   Reps 10-12     NuStep   Level 4   Minutes 30   METs 2.1     Home Exercise Plan   Plans to continue exercise at Home  Reviewed HEP on 08/13/16. See progress note   Frequency Add 2 additional days to program exercise sessions.      Nutrition:  Target Goals: Understanding of nutrition guidelines, daily intake of sodium 1500mg , cholesterol 200mg , calories 30% from fat and  7% or less from saturated fats, daily to have 5 or more servings of fruits and vegetables.  Biometrics:    Nutrition Therapy Plan and Nutrition Goals:     Nutrition Therapy & Goals - 09/03/16 1609      Nutrition Therapy   Diet Therapeutic Lifestyle Changes     Personal Nutrition Goals   Personal Goal #1 1-2 lb wt loss/week to a goal of 6-24 lb wt loss     Intervention Plan   Intervention Prescribe, educate and counsel regarding individualized specific dietary modifications aiming towards targeted core components such as weight, hypertension, lipid management, diabetes, heart failure and other comorbidities.   Expected Outcomes Short Term Goal: Understand basic principles of dietary content, such as calories, fat, sodium, cholesterol and nutrients.;Long Term Goal: Adherence to prescribed nutrition plan.      Nutrition Discharge: Nutrition Scores:     Nutrition Assessments - 09/03/16 1606      MEDFICTS Scores   Pre Score 19  Nutrition Goals Re-Evaluation:   Psychosocial: Target Goals: Acknowledge presence or absence of depression, maximize coping skills, provide positive support system. Participant is able to verbalize types and ability to use techniques and skills needed for reducing stress and depression.  Initial Review & Psychosocial Screening:     Initial Psych Review & Screening - 07/24/16 1703      Initial Review   Current issues with Current Stress Concerns   Source of Stress Concerns Chronic Illness;Unable to participate in former interests or hobbies     Family Dynamics   Good Support System? Yes     Barriers   Psychosocial barriers to participate in program There are no identifiable barriers or psychosocial needs.     Screening Interventions   Interventions Encouraged to exercise      Quality of Life Scores:     Quality of Life - 08/29/16 1701      Quality of Life Scores   GLOBAL Pre --  reviewed with pt who reports major concern is  health related physical decline and inablity to participate in tasks as prior to cardiac event. pt health related anxiety due to cardiac event occurring while on vacation.       PHQ-9: Recent Review Flowsheet Data    [facility] 07/30/2016 04/20/2014 03/05/2013   Decreased Interest - 0 0   Down, Depressed, Hopeless 1  0 1   PHQ - 2 Score 1 0 1      Psychosocial Evaluation and Intervention:     Psychosocial Evaluation - 08/17/16 1654      Psychosocial Evaluation & Interventions   Continued Psychosocial Services Needed --      Psychosocial Re-Evaluation:     Psychosocial Re-Evaluation    Row Name 09/12/16 1134 10/10/16 1803 11/06/16 1028         Psychosocial Re-Evaluation   Interventions Encouraged to attend Cardiac Rehabilitation for the exercise  - Encouraged to attend Cardiac Rehabilitation for the exercise     Comments pt health related anxiety is diminishing. pt feels more comfortable and confident with travel.  pt [place]. pt encouraged to pace herself and take rest breaks when necessary.  pt reports she and her family have noticed her increased strength and stamina. pt with frequent absences due to scheduled family vacation and medical complications following her trip.  no psychosocial needs identified, no interventions necessary.  pt feels like she is starting over after extended absence for travel and retina complications.       Continued Psychosocial Services Needed No  no psychosocial needs identified, no inteventions necessary  No No        Vocational Rehabilitation: Provide vocational rehab assistance to qualifying candidates.   Vocational Rehab Evaluation & Intervention:     Vocational Rehab - 07/24/16 1655      Initial Vocational Rehab Evaluation & Intervention   Assessment shows need for Vocational Rehabilitation No      Education: Education Goals: Education classes will be provided on  a weekly basis, covering required topics. Participant will state understanding/return demonstration of topics presented.  Learning Barriers/Preferences:     Learning Barriers/Preferences - 07/24/16 1226      Learning Barriers/Preferences   Learning Barriers Sight;Hearing   Learning Preferences Written Material      Education Topics: Count Your Pulse:  -Group instruction provided by verbal instruction, demonstration, patient participation and written materials to support subject.  Instructors address importance of being able  to find your pulse and how to count your pulse when at home without a heart monitor.  Patients get hands on experience counting their pulse with staff help and individually. Flowsheet Row CARDIAC REHAB PHASE II EXERCISE from 09/05/2016 in [facility] CARDIAC REHAB  Date  08/03/16  Educator  Gladstone Lighter, RN  Instruction Review Code  2- meets goals/outcomes      Heart Attack, Angina, and Risk Factor Modification:  -Group instruction provided by verbal instruction, video, and written materials to support subject.  Instructors address signs and symptoms of angina and heart attacks.    Also discuss risk factors for heart disease and how to make changes to improve heart health risk factors.   Functional Fitness:  -Group instruction provided by verbal instruction, demonstration, patient participation, and written materials to support subject.  Instructors address safety measures for doing things around the house.  Discuss how to get up and down off the floor, how to pick things up properly, how to safely get out of a chair without assistance, and balance training.   Meditation and Mindfulness:  -Group instruction provided by verbal instruction, patient participation, and written materials to support subject.  Instructor addresses importance of mindfulness and meditation practice to help reduce stress and improve awareness.  Instructor also leads  participants through a meditation exercise.    Stretching for Flexibility and Mobility:  -Group instruction provided by verbal instruction, patient participation, and written materials to support subject.  Instructors lead participants through series of stretches that are designed to increase flexibility thus improving mobility.  These stretches are additional exercise for major muscle groups that are typically performed during regular warm up and cool down.   Hands Only CPR Anytime:  -Group instruction provided by verbal instruction, video, patient participation and written materials to support subject.  Instructors co-teach with AHA video for hands only CPR.  Participants get hands on experience with mannequins.   Nutrition I class: Heart Healthy Eating:  -Group instruction provided by PowerPoint slides, verbal discussion, and written materials to support subject matter. The instructor gives an explanation and review of the Therapeutic Lifestyle Changes diet recommendations, which includes a discussion on lipid goals, dietary fat, sodium, fiber, plant stanol/sterol esters, sugar, and the components of a well-balanced, healthy diet. Flowsheet Row CARDIAC REHAB PHASE II EXERCISE from 09/05/2016 in [facility] CARDIAC REHAB  Date  09/04/16  Educator  RD  Instruction Review Code  2- meets goals/outcomes      Nutrition II class: Lifestyle Skills:  -Group instruction provided by PowerPoint slides, verbal discussion, and written materials to support subject matter. The instructor gives an explanation and review of label reading, grocery shopping for heart health, heart healthy recipe modifications, and ways to make healthier choices when eating out. Flowsheet Row CARDIAC REHAB PHASE II EXERCISE from 09/05/2016 in [facility] CARDIAC REHAB  Date  09/03/16  Educator  RD  Instruction Review Code  Not applicable [class handouts given]      Diabetes Question &  Answer:  -Group instruction provided by PowerPoint slides, verbal discussion, and written materials to support subject matter. The instructor gives an explanation and review of diabetes co-morbidities, pre- and post-prandial blood glucose goals, pre-exercise blood glucose goals, signs, symptoms, and treatment of hypoglycemia and hyperglycemia, and foot care basics.   Diabetes Blitz:  -Group instruction provided by PowerPoint slides, verbal discussion, and written materials to support subject matter. The instructor gives an explanation and review of the physiology  behind type 1 and type 2 diabetes, diabetes medications and rational behind using different medications, pre- and post-prandial blood glucose recommendations and Hemoglobin A1c goals, diabetes diet, and exercise including blood glucose guidelines for exercising safely.    Portion Distortion:  -Group instruction provided by PowerPoint slides, verbal discussion, written materials, and food models to support subject matter. The instructor gives an explanation of serving size versus portion size, changes in portions sizes over the last 20 years, and what consists of a serving from each food group. Flowsheet Row CARDIAC REHAB PHASE II EXERCISE from 09/05/2016 in [facility] CARDIAC REHAB  Date  09/05/16  Educator  RD  Instruction Review Code  2- meets goals/outcomes      Stress Management:  -Group instruction provided by verbal instruction, video, and written materials to support subject matter.  Instructors review role of stress in heart disease and how to cope with stress positively.   Flowsheet Row CARDIAC REHAB PHASE II EXERCISE from 09/05/2016 in [facility] CARDIAC REHAB  Date  08/29/16  Instruction Review Code  2- meets goals/outcomes      Exercising on Your Own:  -Group instruction provided by verbal instruction, power point, and written materials to support subject.  Instructors discuss benefits  of exercise, components of exercise, frequency and intensity of exercise, and end points for exercise.  Also discuss use of nitroglycerin and activating EMS.  Review options of places to exercise outside of rehab.  Review guidelines for sex with heart disease. Flowsheet Row CARDIAC REHAB PHASE II EXERCISE from 09/05/2016 in [facility] CARDIAC REHAB  Date  08/22/16  Instruction Review Code  2- meets goals/outcomes      Cardiac Drugs I:  -Group instruction provided by verbal instruction and written materials to support subject.  Instructor reviews cardiac drug classes: antiplatelets, anticoagulants, beta blockers, and statins.  Instructor discusses reasons, side effects, and lifestyle considerations for each drug class.   Cardiac Drugs II:  -Group instruction provided by verbal instruction and written materials to support subject.  Instructor reviews cardiac drug classes: angiotensin converting enzyme inhibitors (ACE-I), angiotensin II receptor blockers (ARBs), nitrates, and calcium channel blockers.  Instructor discusses reasons, side effects, and lifestyle considerations for each drug class.   Anatomy and Physiology of the Circulatory System:  -Group instruction provided by verbal instruction, video, and written materials to support subject.  Reviews functional anatomy of heart, how it relates to various diagnoses, and what role the heart plays in the overall system. Flowsheet Row CARDIAC REHAB PHASE II EXERCISE from 09/05/2016 in [facility] CARDIAC REHAB  Date  08/08/16  Instruction Review Code  2- meets goals/outcomes      Knowledge Questionnaire Score:     Knowledge Questionnaire Score - 07/26/16 1748      Knowledge Questionnaire Score   Pre Score 23/24      Core Components/Risk Factors/Patient Goals at Admission:     Personal Goals and Risk Factors at Admission - 07/24/16 1707      Core Components/Risk Factors/Patient Goals on Admission    Hypertension Yes   Intervention Provide education on lifestyle modifcations including regular physical activity/exercise, weight management, moderate sodium restriction and increased consumption of fresh fruit, vegetables, and low fat dairy, alcohol moderation, and smoking cessation.;Monitor prescription use compliance.   Expected Outcomes Short Term: Continued assessment and intervention until BP is < 140/18mm HG in hypertensive participants. < 130/57mm HG in hypertensive participants with diabetes, heart failure or chronic kidney disease.;Long  Term: Maintenance of blood pressure at goal levels.   Lipids Yes   Intervention Provide education and support for participant on nutrition & aerobic/resistive exercise along with prescribed medications to achieve LDL 70mg , HDL >40mg .   Expected Outcomes Short Term: Participant states understanding of desired cholesterol values and is compliant with medications prescribed. Participant is following exercise prescription and nutrition guidelines.;Long Term: Cholesterol controlled with medications as prescribed, with individualized exercise RX and with personalized nutrition plan. Value goals: LDL < 70mg , HDL > 40 mg.      Core Components/Risk Factors/Patient Goals Review:      Goals and Risk Factor Review    Row Name 08/13/16 1632 11/06/16 0837           Core Components/Risk Factors/Patient Goals Review   Personal Goals Review Other;Increase Strength and Stamina Other      Review Discussed home exercise to assist with improving cardiovascular fitness.  Pt c/o SOB and thinks it may be due to meds. Encouraged speaking with RN or cardiologist      Expected Outcomes Pt will exercise 2-3x/week at home  Pt will start to improve SOB symptoms         Core Components/Risk Factors/Patient Goals at Discharge (Final Review):      Goals and Risk Factor Review - 11/06/16 0837      Core Components/Risk Factors/Patient Goals Review   Personal Goals Review Other    Review Pt c/o SOB and thinks it may be due to meds. Encouraged speaking with RN or cardiologist   Expected Outcomes Pt will start to improve SOB symptoms      ITP Comments:     ITP Comments    Row Name 07/24/16 1221 09/07/16 1442         ITP Comments Dr. Armanda Magic, Medical Director  attended Hypertension education lecture         Comments: Pt is making slow  progress toward personal goals after completing 24 sessions. Pt has had frequent absences however is now able to attend class consistently.  Recommend continued exercise and life style modification education including  stress management and relaxation techniques to decrease cardiac risk profile.

## 2016-11-12 ENCOUNTER — Encounter (HOSPITAL_COMMUNITY)
Admission: RE | Admit: 2016-11-12 | Discharge: 2016-11-12 | Disposition: A | Discharge status: Home / Self Care | Payer: Medicare Other | Source: Ambulatory Visit | Attending: Cardiology | Admitting: Cardiology

## 2016-11-12 DIAGNOSIS — I214 Non-ST elevation (NSTEMI) myocardial infarction: Secondary | ICD-10-CM

## 2016-11-12 DIAGNOSIS — Z955 Presence of coronary angioplasty implant and graft: Secondary | ICD-10-CM

## 2016-11-14 ENCOUNTER — Encounter (HOSPITAL_COMMUNITY)
Admission: RE | Admit: 2016-11-14 | Discharge: 2016-11-14 | Disposition: A | Discharge status: Home / Self Care | Payer: Medicare Other | Source: Ambulatory Visit | Attending: Cardiology | Admitting: Cardiology

## 2016-11-14 DIAGNOSIS — I214 Non-ST elevation (NSTEMI) myocardial infarction: Secondary | ICD-10-CM | POA: Diagnosis not present

## 2016-11-14 DIAGNOSIS — Z955 Presence of coronary angioplasty implant and graft: Secondary | ICD-10-CM

## 2016-11-16 ENCOUNTER — Encounter (HOSPITAL_COMMUNITY)
Admission: RE | Admit: 2016-11-16 | Discharge: 2016-11-16 | Disposition: A | Discharge status: Home / Self Care | Payer: Medicare Other | Source: Ambulatory Visit | Attending: Cardiology | Admitting: Cardiology

## 2016-11-16 DIAGNOSIS — I214 Non-ST elevation (NSTEMI) myocardial infarction: Secondary | ICD-10-CM | POA: Diagnosis not present

## 2016-11-16 DIAGNOSIS — Z955 Presence of coronary angioplasty implant and graft: Secondary | ICD-10-CM

## 2016-11-19 ENCOUNTER — Encounter (HOSPITAL_COMMUNITY)
Admission: RE | Admit: 2016-11-19 | Discharge: 2016-11-19 | Disposition: A | Discharge status: Home / Self Care | Payer: Medicare Other | Source: Ambulatory Visit | Attending: Cardiology | Admitting: Cardiology

## 2016-11-19 VITALS — Ht 68.0 in | Wt 289.7 lb

## 2016-11-19 DIAGNOSIS — Z955 Presence of coronary angioplasty implant and graft: Secondary | ICD-10-CM

## 2016-11-19 DIAGNOSIS — I214 Non-ST elevation (NSTEMI) myocardial infarction: Secondary | ICD-10-CM

## 2016-11-28 ENCOUNTER — Encounter (HOSPITAL_COMMUNITY): Payer: Self-pay

## 2016-12-17 NOTE — Addendum Note (Signed)
Encounter addended by: Jacques Earthly, RD on: 12/17/2016  2:28 PM<BR>    Actions taken: Flowsheet data copied forward, Visit Navigator Flowsheet section accepted

## 2017-01-07 ENCOUNTER — Encounter (HOSPITAL_COMMUNITY)
Admission: RE | Admit: 2017-01-07 | Discharge: 2017-01-07 | Disposition: A | Discharge status: Home / Self Care | Payer: Self-pay | Source: Ambulatory Visit | Attending: Cardiology | Admitting: Cardiology

## 2017-01-07 DIAGNOSIS — I214 Non-ST elevation (NSTEMI) myocardial infarction: Secondary | ICD-10-CM | POA: Insufficient documentation

## 2017-01-09 ENCOUNTER — Encounter (HOSPITAL_COMMUNITY)
Admission: RE | Admit: 2017-01-09 | Discharge: 2017-01-09 | Disposition: A | Discharge status: Home / Self Care | Payer: Self-pay | Source: Ambulatory Visit | Attending: Cardiology | Admitting: Cardiology

## 2017-01-11 ENCOUNTER — Encounter (HOSPITAL_COMMUNITY)
Admission: RE | Admit: 2017-01-11 | Discharge: 2017-01-11 | Disposition: A | Discharge status: Home / Self Care | Payer: Self-pay | Source: Ambulatory Visit | Attending: Cardiology | Admitting: Cardiology

## 2017-01-14 ENCOUNTER — Other Ambulatory Visit: Payer: Self-pay

## 2017-01-14 ENCOUNTER — Encounter (HOSPITAL_COMMUNITY)
Admission: RE | Admit: 2017-01-14 | Discharge: 2017-01-14 | Disposition: A | Discharge status: Home / Self Care | Payer: Self-pay | Source: Ambulatory Visit | Attending: Cardiology | Admitting: Cardiology

## 2017-01-14 ENCOUNTER — Telehealth: Payer: Self-pay | Admitting: Cardiology

## 2017-01-14 DIAGNOSIS — I10 Essential (primary) hypertension: Secondary | ICD-10-CM

## 2017-01-14 DIAGNOSIS — E782 Mixed hyperlipidemia: Secondary | ICD-10-CM

## 2017-01-14 NOTE — Telephone Encounter (Signed)
Returned call to patient.Advised I will put in new fasting lab orders for lipid,hepatic panels [facility].

## 2017-01-14 NOTE — Telephone Encounter (Signed)
New Message   Pt calling stating the last time she came in, she received orders for labs through [facility]. She states her insurance wont cover [facility] and needs a new order.

## 2017-01-16 ENCOUNTER — Encounter (HOSPITAL_COMMUNITY)
Admission: RE | Admit: 2017-01-16 | Discharge: 2017-01-16 | Disposition: A | Discharge status: Home / Self Care | Payer: Self-pay | Source: Ambulatory Visit | Attending: Cardiology | Admitting: Cardiology

## 2017-01-18 ENCOUNTER — Encounter (HOSPITAL_COMMUNITY): Payer: Self-pay

## 2017-01-19 LAB — HEPATIC FUNCTION PANEL
ALBUMIN: 4.3 g/dL (ref 3.5–4.8)
ALK PHOS: 97 IU/L (ref 39–117)
ALT: 19 IU/L (ref 0–32)
AST: 17 IU/L (ref 0–40)
Bilirubin Total: 0.4 mg/dL (ref 0.0–1.2)
Bilirubin, Direct: 0.1 mg/dL (ref 0.00–0.40)
TOTAL PROTEIN: 7 g/dL (ref 6.0–8.5)

## 2017-01-19 LAB — LIPID PANEL
CHOLESTEROL TOTAL: 237 mg/dL — AB (ref 100–199)
Chol/HDL Ratio: 6.6 ratio units — ABNORMAL HIGH (ref 0.0–4.4)
HDL: 36 mg/dL — ABNORMAL LOW (ref >39)
LDL Calculated: 133 mg/dL — ABNORMAL HIGH (ref 0–99)
Triglycerides: 341 mg/dL — ABNORMAL HIGH (ref 0–149)
VLDL Cholesterol Cal: 68 mg/dL — ABNORMAL HIGH (ref 5–40)

## 2017-01-21 ENCOUNTER — Encounter (HOSPITAL_COMMUNITY)
Admission: RE | Admit: 2017-01-21 | Discharge: 2017-01-21 | Disposition: A | Discharge status: Home / Self Care | Payer: Self-pay | Source: Ambulatory Visit | Attending: Cardiology | Admitting: Cardiology

## 2017-01-23 ENCOUNTER — Encounter (HOSPITAL_COMMUNITY)
Admission: RE | Admit: 2017-01-23 | Discharge: 2017-01-23 | Disposition: A | Discharge status: Home / Self Care | Payer: Self-pay | Source: Ambulatory Visit | Attending: Cardiology | Admitting: Cardiology

## 2017-01-24 ENCOUNTER — Ambulatory Visit (INDEPENDENT_AMBULATORY_CARE_PROVIDER_SITE_OTHER): Payer: Medicare Other | Admitting: Pharmacist Clinician (PhC)/ Clinical Pharmacy Specialist

## 2017-01-24 DIAGNOSIS — E782 Mixed hyperlipidemia: Secondary | ICD-10-CM | POA: Diagnosis not present

## 2017-01-24 NOTE — Progress Notes (Signed)
01/25/2017 Victoria Delgado Sep 13, 1941 [id number]   HPI:  Victoria Delgado is a 76 y.o. female patient of Dr Antoine Poche, who presents today for a lipid clinic evaluation.  Her cardiac history is significant for CAD with PCI and 2 DES in May 2017; and atrial fibrillation.  Current Medications:  Rosuvastatin 10 mg q3d (4 doses total)  Cholesterol Goals:   LDL < 70 Intolerant/previously tried:  Was on atorvastatin for 10 or more years, stopped due to myalgias, but unsure if they were caused by medication or need for hip problems.  After stents placed in May 2017 was put on simvastatin 40 mg but this caused her to have severe gas, bloating and uncontrolled stools.  Was replaced with rosuvastatin 20 mg with the same problems.  Dose has been held then tapered down to where she is now on rosuvastatin 10 mg every 3rd day.  She seems to be tolerating this, although has only been 4 doses.    Family history:   Mother died at 49 with ASCVD and stroke, no other significant cardiac history  Diet:   Avoids eating out, avoids fried foods and starchy "white" foods  Exercise:    Started cardiac rehab recently, usually will do water exercises in summer months Labs:   01-2017:  TC 237, TG 341, HDL 36, LDL 133  07-2016:  TC 158, TG 185, HDL 36, LDL 85  Current Outpatient Prescriptions  Medication Sig Dispense Refill  . acetaminophen (TYLENOL) 500 MG tablet Take 500 mg by mouth every 6 (six) hours as needed for mild pain.    Marland Kitchen aspirin EC 81 MG tablet Take 81 mg by mouth daily.    Marland Kitchen CARTIA XT 240 MG 24 hr capsule TAKE 1 CAPSULE(240 MG) BY MOUTH AT BEDTIME 90 capsule 3  . furosemide (LASIX) 40 MG tablet TAKE 1 TABLET BY MOUTH EVERY DAY WITH POTASSIUM PILL 90 tablet 2  . METOPROLOL SUCCINATE ER PO Take 50 mg by mouth 2 (two) times daily. 50 mg QAM, 25 mg QPM per pt    . nitroGLYCERIN (NITROSTAT) 0.4 MG SL tablet Place 1 tablet (0.4 mg total) under the tongue every 5 (five) minutes as needed for chest pain. 25 tablet 3   . potassium chloride (K-DUR,KLOR-CON) 10 MEQ tablet Take 10 mEq by mouth daily.     . prasugrel (EFFIENT) 10 MG TABS tablet Take 10 mg by mouth daily.    . ranitidine (ZANTAC) 150 MG tablet Take 150 mg by mouth 2 (two) times daily.    . rosuvastatin (CRESTOR) 20 MG tablet Take 20 mg by mouth every 3 (three) days.     No current facility-administered medications for this visit.     Allergies  Allergen Reactions  . Other Other (See Comments)    Gas bubble C3F8 has green bracelet **  . Atorvastatin Other (See Comments)    myalgias  . Codeine Nausea Only  . Kenalog [Triamcinolone Acetonide] Other (See Comments)    Had A.fib after she received the injection."Steroid meds"  . Relafen [Nabumetone] Other (See Comments)    Edema   . Penicillins Rash    40 years ago, injection site was red and swollen.   Has patient had a PCN reaction causing immediate rash, facial/tongue/throat swelling, SOB or lightheadedness with hypotension: YES Has patient had a PCN reaction causing severe rash involving mucus membranes or skin necrosis: NO Has patient had a PCN reaction that required hospitalization. No  Has patient had a PCN reaction occurring within  the last 10 years: NO If all of the above answers are "NO", then may proceed with Cephalosporin use.    . Tramadol Other (See Comments)    Auditory halllucinations    Past Medical History:  Diagnosis Date  . Arthritis    osteoarthritis-hips,knees, back, hands  . Asymmetric septal hypertrophy (HCC)   . Atrial fibrillation (HCC)   . CAD (coronary artery disease)    2 drug-eluting stents placed in the circumflex leading into a marginal.  May 2017.  [name]  . Cataract    corrective surgery done  . Fatty liver   . Gallstones   . GERD (gastroesophageal reflux disease)   . Heart palpitations   . Hepatitis    hepatitis A; past hx.,many yrs ago ? food source  . Hernia, hiatal   . Hyperlipidemia   . Hypertension   . Obesity   . Sleep apnea     CPAP    There were no vitals taken for this visit.   Mixed hyperlipidemia Patient very eager to be on lipid lowering therapy and continues to work at finding acceptable medication tolerance.  She has taken 4 doses of rosuvastatin 10 mg, every 3rd day and has done well so far.  She will continue with this plan for the next few months.  If she develops an intolerance, she would like to re-challenge with atorvastatin, which did not cause her GI problems.  We also discussed the options of Repatha or research studies.  She will call if any problems in the next few weeks, otherwise we will plan to repeat lipid labs in 2-3 months and make any further adjustments at that time.     Phillips Hay PharmD CPP  [facility]

## 2017-01-24 NOTE — Patient Instructions (Signed)
Continue with the rosuvastatin 10 mg every 3 days.  If you need to cut back to weekly, please do so.    If you decide that it is not going to work for you, please give me a call Victoria Delgado at [phone]).  At that time we can consider re-challenging you with the Lipitor.   Cholesterol Cholesterol is a fat. Your body needs a small amount of cholesterol. Cholesterol (plaque) may build up in your blood vessels (arteries). That makes you more likely to have a heart attack or stroke. You cannot feel your cholesterol level. Having a blood test is the only way to find out if your level is high. Keep your test results. Work with your doctor to keep your cholesterol at a good level. What do the results mean?  Total cholesterol is how much cholesterol is in your blood.  LDL is bad cholesterol. This is the type that can build up. Try to have low LDL.  HDL is good cholesterol. It cleans your blood vessels and carries LDL away. Try to have high HDL.  Triglycerides are fat that the body can store or burn for energy. What are good levels of cholesterol?  Total cholesterol below 200.  LDL below 100 is good for people who have health risks. LDL below 70 is good for people who have very high risks.  HDL above 40 is good. It is best to have HDL of 60 or higher.  Triglycerides below 150. How can I lower my cholesterol? Diet  Follow your diet program as told by your doctor.  Choose fish, white meat chicken, or [place] that is roasted or baked. Try not to eat red meat, fried foods, sausage, or lunch meats.  Eat lots of fresh fruits and vegetables.  Choose whole grains, beans, pasta, potatoes, and cereals.  Choose olive oil, corn oil, or canola oil. Only use small amounts.  Try not to eat butter, mayonnaise, shortening, or palm kernel oils.  Try not to eat foods with trans fats.  Choose low-fat or nonfat dairy foods.  Drink skim or nonfat milk.  Eat low-fat or nonfat yogurt and cheeses.  Try  not to drink whole milk or cream.  Try not to eat ice cream, egg yolks, or full-fat cheeses.  Healthy desserts include angel food cake, ginger snaps, animal crackers, hard candy, popsicles, and low-fat or nonfat frozen yogurt. Try not to eat pastries, cakes, pies, and cookies. Exercise  Follow your exercise program as told by your doctor.  Be more active. Try gardening, walking, and taking the stairs.  Ask your doctor about ways that you can be more active. Medicine  Take over-the-counter and prescription medicines only as told by your doctor. This information is not intended to replace advice given to you by your health care provider. Make sure you discuss any questions you have with your health care provider. Document Released: 02/15/2009 Document Revised: 06/20/2016 Document Reviewed: 05/31/2016 Elsevier Interactive Patient Education  2017 [employer].

## 2017-01-25 ENCOUNTER — Encounter: Payer: Self-pay | Admitting: Pharmacist Clinician (PhC)/ Clinical Pharmacy Specialist

## 2017-01-25 ENCOUNTER — Encounter (HOSPITAL_COMMUNITY)
Admission: RE | Admit: 2017-01-25 | Discharge: 2017-01-25 | Disposition: A | Discharge status: Home / Self Care | Payer: Self-pay | Source: Ambulatory Visit | Attending: Cardiology | Admitting: Cardiology

## 2017-01-25 NOTE — Assessment & Plan Note (Signed)
Patient very eager to be on lipid lowering therapy and continues to work at finding acceptable medication tolerance.  She has taken 4 doses of rosuvastatin 10 mg, every 3rd day and has done well so far.  She will continue with this plan for the next few months.  If she develops an intolerance, she would like to re-challenge with atorvastatin, which did not cause her GI problems.  We also discussed the options of Repatha or research studies.  She will call if any problems in the next few weeks, otherwise we will plan to repeat lipid labs in 2-3 months and make any further adjustments at that time.

## 2017-01-28 ENCOUNTER — Encounter (HOSPITAL_COMMUNITY)
Admission: RE | Admit: 2017-01-28 | Discharge: 2017-01-28 | Disposition: A | Discharge status: Home / Self Care | Payer: Self-pay | Source: Ambulatory Visit | Attending: Cardiology | Admitting: Cardiology

## 2017-01-30 ENCOUNTER — Encounter (HOSPITAL_COMMUNITY): Payer: Self-pay

## 2017-02-01 ENCOUNTER — Encounter (HOSPITAL_COMMUNITY): Payer: Self-pay

## 2017-02-01 DIAGNOSIS — I214 Non-ST elevation (NSTEMI) myocardial infarction: Secondary | ICD-10-CM | POA: Insufficient documentation

## 2017-02-04 ENCOUNTER — Encounter (HOSPITAL_COMMUNITY)
Admission: RE | Admit: 2017-02-04 | Discharge: 2017-02-04 | Disposition: A | Discharge status: Home / Self Care | Payer: Self-pay | Source: Ambulatory Visit | Attending: Cardiology | Admitting: Cardiology

## 2017-02-04 NOTE — Progress Notes (Signed)
HPI The patient presents for follow up of CAD.  She has atrial fibrillation which she ultimately thinks was related to a steroid injection. This was in 2008. At that time she was found to have asymmetric septal hypertrophy. This has been followed conservatively.  Echocardiogram did demonstrate concentric LV hypertrophy which was moderate and moderate LAE.  In May of last year she was in the hospital with CAD.  She was admitted to [facility] on 5/24 after waking up with left arm pain.  She had positive troponin with  PCI to the mid LCX and 1st DX. She is on DAPT with aspirin/Effient. She had 2 drug-eluting stents placed in the circumflex leading into a marginal. This was apparently somewhat small circumflex. She also had a diagonal had a drug-eluting stent placed. She had a preserved ejection fraction. An echo in our office showed well preserved ejection fraction. There was some moderate aortic stenosis. There was no mention of hypertrophic obstruction.    When I last saw her she was having problems with a retinal tear. She wasn't doing cardiac rehabilitation.  She was complaining of dyspnea but a BNP level was normal.  Since then she has done very well.  She is back in rehab.  The patient denies any new symptoms such as chest discomfort, neck or arm discomfort. There has been no new shortness of breath, PND or orthopnea. There have been no reported palpitations, presyncope or syncope.  Allergies  Allergen Reactions  . Other Other (See Comments)    Gas bubble C3F8 has green bracelet **  . Atorvastatin Other (See Comments)    myalgias  . Codeine Nausea Only  . Kenalog [Triamcinolone Acetonide] Other (See Comments)    Had A.fib after she received the injection."Steroid meds"  . Relafen [Nabumetone] Other (See Comments)    Edema   . Penicillins Rash    40 years ago, injection site was red and swollen.   Has patient had a PCN reaction causing immediate rash, facial/tongue/throat swelling, SOB  or lightheadedness with hypotension: YES Has patient had a PCN reaction causing severe rash involving mucus membranes or skin necrosis: NO Has patient had a PCN reaction that required hospitalization. No  Has patient had a PCN reaction occurring within the last 10 years: NO If all of the above answers are "NO", then may proceed with Cephalosporin use.    . Tramadol Other (See Comments)    Auditory halllucinations    Current Outpatient Prescriptions  Medication Sig Dispense Refill  . acetaminophen (TYLENOL) 500 MG tablet Take 500 mg by mouth every 6 (six) hours as needed for mild pain.    Marland Kitchen aspirin EC 81 MG tablet Take 81 mg by mouth daily.    Marland Kitchen CARTIA XT 240 MG 24 hr capsule TAKE 1 CAPSULE(240 MG) BY MOUTH AT BEDTIME 90 capsule 3  . furosemide (LASIX) 40 MG tablet TAKE 1 TABLET BY MOUTH EVERY DAY WITH POTASSIUM PILL 90 tablet 2  . METOPROLOL SUCCINATE ER PO Take 50 mg by mouth 2 (two) times daily. 50 mg QAM, 25 mg QPM per pt    . nitroGLYCERIN (NITROSTAT) 0.4 MG SL tablet Place 1 tablet (0.4 mg total) under the tongue every 5 (five) minutes as needed for chest pain. 25 tablet 3  . potassium chloride (K-DUR,KLOR-CON) 10 MEQ tablet Take 10 mEq by mouth daily.     . prasugrel (EFFIENT) 10 MG TABS tablet Take 10 mg by mouth daily.    . ranitidine (ZANTAC) 150 MG  tablet Take 150 mg by mouth 2 (two) times daily.    . rosuvastatin (CRESTOR) 10 MG tablet Take 10 mg by mouth every 3 (three) days.      No current facility-administered medications for this visit.     Past Medical History:  Diagnosis Date  . Arthritis    osteoarthritis-hips,knees, back, hands  . Asymmetric septal hypertrophy (HCC)   . Atrial fibrillation (HCC)   . CAD (coronary artery disease)    2 drug-eluting stents placed in the circumflex leading into a marginal.  May 2017.  [name]  . Cataract    corrective surgery done  . Fatty liver   . Gallstones   . GERD (gastroesophageal reflux disease)   . Heart  palpitations   . Hepatitis    hepatitis A; past hx.,many yrs ago ? food source  . Hernia, hiatal   . Hyperlipidemia   . Hypertension   . Obesity   . Sleep apnea    CPAP    Past Surgical History:  Procedure Laterality Date  . APPENDECTOMY  2004  . CATARACT EXTRACTION W/ INTRAOCULAR LENS IMPLANT     both eyes  . CHOLECYSTECTOMY    . NM MYOCAR PERF WALL MOTION  08/15/04   No ischemia  . RETINAL DETACHMENT SURGERY Bilateral    remains with slight hazy vision with lights  . TOTAL HIP ARTHROPLASTY Right 08/04/2014   Procedure: RIGHT TOTAL HIP ARTHROPLASTY ANTERIOR APPROACH;  Surgeon: Loanne Drilling, MD;  Location: [facility];  Service: Orthopedics;  Laterality: Right;  . TOTAL HIP ARTHROPLASTY Left 01/05/2015   Procedure: LEFT TOTAL HIP ARTHROPLASTY ANTERIOR APPROACH;  Surgeon: Loanne Drilling, MD;  Location: [facility];  Service: Orthopedics;  Laterality: Left;  . US ECHOCARDIOGRAPHY  06/25/2012   Moderate ASH,LV hyperdynamic,LA is mod. dilated,trace MR,TR,AI    ROS:     As stated in the HPI and negative for all other systems.  PHYSICAL EXAM BP 116/60   Pulse (!) 59   Ht 5\' 8"  (1.727 m)   Wt 290 lb (131.5 kg)   BMI 44.09 kg/m  GENERAL:  Well appearing NECK:  No jugular venous distention, waveform within normal limits, carotid upstroke brisk and symmetric, no bruits, no thyromegaly LUNGS:  Clear to auscultation bilaterally CHEST:  Unremarkable HEART:  PMI not displaced or sustained,S1 and S2 within normal limits, no S3, no S4, no clicks, no rubs, 3/6 systolic murmur radiating out the aortic outflow tract and into the carotids not increasing with a strain phase of Valsalva, no diastolic murmurs ABD:  Flat, positive bowel sounds normal in frequency in pitch, no bruits, no rebound, no guarding, no midline pulsatile mass, no hepatomegaly, no splenomegaly, obese EXT:  2 plus pulses throughout, trace edema, no cyanosis no clubbing  Lab Results  Component Value Date   CHOL 237 (H) 01/18/2017    TRIG 341 (H) 01/18/2017   HDL 36 (L) 01/18/2017   LDLCALC 133 (H) 01/18/2017   EKG:  Sinus rhythm, rate 59, axis within normal limits, intervals within normal limits, no acute ST-T wave changes.  02/05/2017  ASSESSMENT AND PLAN  CAD:  The patient has no new sypmtoms.  No further cardiovascular testing is indicated.  We will continue with aggressive risk reduction and meds as listed.  I will see her back in late May or early June and likely stop her Brilinta.   Hypertrophic cardiomyopathy - I do not see evidence of this on the recent echo.  No change in therapy.  AS - She does have some moderate aortic stenosis.  I will follow this clinically.  I will repeat an echo later this year.   Dyslipidemia - She is tolerating the statin again and will follow in the lipid clinic.  Palpitations - She is currently not having these  Hypertension  The blood pressure is at target. No change in medications is indicated. We will continue with therapeutic lifestyle changes (TLC).  Dyspnea This is improved.

## 2017-02-05 ENCOUNTER — Ambulatory Visit (INDEPENDENT_AMBULATORY_CARE_PROVIDER_SITE_OTHER): Payer: Medicare Other | Admitting: Cardiology

## 2017-02-05 ENCOUNTER — Encounter: Payer: Self-pay | Admitting: Cardiology

## 2017-02-05 VITALS — BP 116/60 | HR 59 | Ht 68.0 in | Wt 290.0 lb

## 2017-02-05 DIAGNOSIS — I2510 Atherosclerotic heart disease of native coronary artery without angina pectoris: Secondary | ICD-10-CM

## 2017-02-05 DIAGNOSIS — I480 Paroxysmal atrial fibrillation: Secondary | ICD-10-CM

## 2017-02-05 DIAGNOSIS — I350 Nonrheumatic aortic (valve) stenosis: Secondary | ICD-10-CM | POA: Diagnosis not present

## 2017-02-05 NOTE — Patient Instructions (Signed)
Medication Instructions:  Continue current medications  Labwork: None ordered  Testing/Procedures: None Ordered  Follow-Up: Your physician recommends that you schedule a follow-up appointment in: June 2018   Any Other Special Instructions Will Be Listed Below (If Applicable).   If you need a refill on your cardiac medications before your next appointment, please call your pharmacy.

## 2017-02-06 ENCOUNTER — Encounter (HOSPITAL_COMMUNITY)
Admission: RE | Admit: 2017-02-06 | Discharge: 2017-02-06 | Disposition: A | Discharge status: Home / Self Care | Payer: Self-pay | Source: Ambulatory Visit | Attending: Cardiology | Admitting: Cardiology

## 2017-02-08 ENCOUNTER — Encounter (HOSPITAL_COMMUNITY)
Admission: RE | Admit: 2017-02-08 | Discharge: 2017-02-08 | Disposition: A | Discharge status: Home / Self Care | Payer: Self-pay | Source: Ambulatory Visit | Attending: Cardiology | Admitting: Cardiology

## 2017-02-11 ENCOUNTER — Encounter (HOSPITAL_COMMUNITY): Admission: RE | Admit: 2017-02-11 | Payer: Self-pay | Source: Ambulatory Visit

## 2017-02-13 ENCOUNTER — Encounter (HOSPITAL_COMMUNITY): Payer: Self-pay

## 2017-02-15 ENCOUNTER — Encounter (HOSPITAL_COMMUNITY): Payer: Self-pay

## 2017-02-18 ENCOUNTER — Encounter (HOSPITAL_COMMUNITY)
Admission: RE | Admit: 2017-02-18 | Discharge: 2017-02-18 | Disposition: A | Discharge status: Home / Self Care | Payer: Self-pay | Source: Ambulatory Visit | Attending: Cardiology | Admitting: Cardiology

## 2017-02-20 ENCOUNTER — Encounter (HOSPITAL_COMMUNITY)
Admission: RE | Admit: 2017-02-20 | Discharge: 2017-02-20 | Disposition: A | Discharge status: Home / Self Care | Payer: Self-pay | Source: Ambulatory Visit | Attending: Cardiology | Admitting: Cardiology

## 2017-02-22 ENCOUNTER — Encounter (HOSPITAL_COMMUNITY)
Admission: RE | Admit: 2017-02-22 | Discharge: 2017-02-22 | Disposition: A | Discharge status: Home / Self Care | Payer: Self-pay | Source: Ambulatory Visit | Attending: Cardiology | Admitting: Cardiology

## 2017-02-25 ENCOUNTER — Encounter (HOSPITAL_COMMUNITY)
Admission: RE | Admit: 2017-02-25 | Discharge: 2017-02-25 | Disposition: A | Discharge status: Home / Self Care | Payer: Self-pay | Source: Ambulatory Visit | Attending: Cardiology | Admitting: Cardiology

## 2017-02-27 ENCOUNTER — Encounter (HOSPITAL_COMMUNITY): Payer: Self-pay

## 2017-03-01 ENCOUNTER — Encounter (HOSPITAL_COMMUNITY): Payer: Self-pay

## 2017-03-04 ENCOUNTER — Encounter (HOSPITAL_COMMUNITY): Payer: Self-pay

## 2017-03-06 ENCOUNTER — Encounter (HOSPITAL_COMMUNITY): Payer: Self-pay

## 2017-03-08 ENCOUNTER — Encounter (HOSPITAL_COMMUNITY): Payer: Self-pay

## 2017-03-11 ENCOUNTER — Encounter (HOSPITAL_COMMUNITY): Payer: Self-pay

## 2017-03-13 ENCOUNTER — Encounter (HOSPITAL_COMMUNITY): Payer: Self-pay

## 2017-03-15 ENCOUNTER — Encounter (HOSPITAL_COMMUNITY): Payer: Self-pay

## 2017-03-18 ENCOUNTER — Encounter (HOSPITAL_COMMUNITY): Payer: Self-pay

## 2017-03-20 ENCOUNTER — Encounter (HOSPITAL_COMMUNITY): Payer: Self-pay

## 2017-03-20 ENCOUNTER — Encounter: Payer: Self-pay | Admitting: Cardiology

## 2017-03-20 DIAGNOSIS — E785 Hyperlipidemia, unspecified: Secondary | ICD-10-CM

## 2017-03-20 NOTE — Telephone Encounter (Signed)
Spoke with [place] Pharmacist and was told to order fasting lipids for pt to get done next week, call and spoke with pt she is aware to get labs fasting, pt [facility]

## 2017-03-22 ENCOUNTER — Encounter (HOSPITAL_COMMUNITY): Payer: Self-pay

## 2017-03-25 ENCOUNTER — Encounter (HOSPITAL_COMMUNITY): Payer: Self-pay

## 2017-03-25 ENCOUNTER — Other Ambulatory Visit: Payer: Self-pay | Admitting: Cardiology

## 2017-03-26 LAB — LIPID PANEL
CHOLESTEROL TOTAL: 193 mg/dL (ref 100–199)
Chol/HDL Ratio: 6 ratio — ABNORMAL HIGH (ref 0.0–4.4)
HDL: 32 mg/dL — AB (ref >39)
LDL CALC: 103 mg/dL — AB (ref 0–99)
Triglycerides: 289 mg/dL — ABNORMAL HIGH (ref 0–149)
VLDL CHOLESTEROL CAL: 58 mg/dL — AB (ref 5–40)

## 2017-03-26 LAB — HEPATIC FUNCTION PANEL
ALBUMIN: 4 g/dL (ref 3.5–4.8)
ALT: 14 IU/L (ref 0–32)
AST: 13 IU/L (ref 0–40)
Alkaline Phosphatase: 85 IU/L (ref 39–117)
BILIRUBIN TOTAL: 0.4 mg/dL (ref 0.0–1.2)
BILIRUBIN, DIRECT: 0.13 mg/dL (ref 0.00–0.40)
TOTAL PROTEIN: 6.3 g/dL (ref 6.0–8.5)

## 2017-03-27 ENCOUNTER — Encounter (HOSPITAL_COMMUNITY): Payer: Self-pay

## 2017-03-28 ENCOUNTER — Encounter: Payer: Self-pay | Admitting: Pharmacist Clinician (PhC)/ Clinical Pharmacy Specialist

## 2017-03-29 ENCOUNTER — Encounter (HOSPITAL_COMMUNITY): Payer: Self-pay

## 2017-04-01 ENCOUNTER — Encounter (HOSPITAL_COMMUNITY): Payer: Self-pay

## 2017-04-03 ENCOUNTER — Encounter (HOSPITAL_COMMUNITY): Payer: Self-pay

## 2017-04-05 ENCOUNTER — Encounter (HOSPITAL_COMMUNITY): Payer: Self-pay

## 2017-04-08 ENCOUNTER — Encounter (HOSPITAL_COMMUNITY): Payer: Self-pay

## 2017-04-10 ENCOUNTER — Encounter (HOSPITAL_COMMUNITY): Payer: Self-pay

## 2017-04-12 ENCOUNTER — Encounter (HOSPITAL_COMMUNITY): Payer: Self-pay

## 2017-04-15 ENCOUNTER — Encounter (HOSPITAL_COMMUNITY): Payer: Self-pay

## 2017-04-17 ENCOUNTER — Encounter (HOSPITAL_COMMUNITY): Payer: Self-pay

## 2017-04-19 ENCOUNTER — Encounter (HOSPITAL_COMMUNITY): Payer: Self-pay

## 2017-04-22 ENCOUNTER — Encounter (HOSPITAL_COMMUNITY): Payer: Self-pay

## 2017-04-24 ENCOUNTER — Encounter (HOSPITAL_COMMUNITY): Payer: Self-pay

## 2017-04-26 ENCOUNTER — Encounter (HOSPITAL_COMMUNITY): Payer: Self-pay

## 2017-05-01 ENCOUNTER — Encounter (HOSPITAL_COMMUNITY): Payer: Self-pay

## 2017-05-02 NOTE — Progress Notes (Signed)
HPI The patient presents for follow up of CAD.  She has atrial fibrillation which she ultimately thinks was related to a steroid injection. This was in 2008. At that time she was found to have asymmetric septal hypertrophy. This has been followed conservatively.  Echocardiogram did demonstrate concentric LV hypertrophy which was moderate and moderate LAE.  In May of last year she was in the hospital with CAD.  She was admitted to [facility] on 5/24 after waking up with left arm pain.  She had positive troponin with  PCI to the mid LCX and 1st DX. She is on DAPT with aspirin/Effient. She had 2 drug-eluting stents placed in the circumflex leading into a marginal. This was apparently somewhat small circumflex. She also had a diagonal had a drug-eluting stent placed. She had a preserved ejection fraction. An echo in our office showed well preserved ejection fraction. There was some moderate aortic stenosis. There was no mention of hypertrophic obstruction.    Since I last saw her she has done well.  The patient denies any new symptoms such as chest discomfort, neck or arm discomfort. There has been no new shortness of breath, PND or orthopnea. There have been no reported palpitations, presyncope or syncope.  She has some chronic dyspnea but she has not been exercising secondary to rain.   Allergies  Allergen Reactions  . Other Other (See Comments)    Gas bubble C3F8 has green bracelet **  . Atorvastatin Other (See Comments)    myalgias  . Codeine Nausea Only  . Kenalog [Triamcinolone Acetonide] Other (See Comments)    Had A.fib after she received the injection."Steroid meds"  . Relafen [Nabumetone] Other (See Comments)    Edema   . Penicillins Rash    40 years ago, injection site was red and swollen.   Has patient had a PCN reaction causing immediate rash, facial/tongue/throat swelling, SOB or lightheadedness with hypotension: YES Has patient had a PCN reaction causing severe rash involving  mucus membranes or skin necrosis: NO Has patient had a PCN reaction that required hospitalization. No  Has patient had a PCN reaction occurring within the last 10 years: NO If all of the above answers are "NO", then may proceed with Cephalosporin use.    . Tramadol Other (See Comments)    Auditory halllucinations    Current Outpatient Prescriptions  Medication Sig Dispense Refill  . acetaminophen (TYLENOL) 500 MG tablet Take 500 mg by mouth every 6 (six) hours as needed for mild pain.    Marland Kitchen aspirin EC 81 MG tablet Take 81 mg by mouth daily.    Marland Kitchen CARTIA XT 240 MG 24 hr capsule TAKE 1 CAPSULE(240 MG) BY MOUTH AT BEDTIME 90 capsule 3  . furosemide (LASIX) 40 MG tablet TAKE 1 TABLET BY MOUTH EVERY DAY WITH POTASSIUM PILL 90 tablet 2  . METOPROLOL SUCCINATE ER PO Take 50 mg by mouth 2 (two) times daily. 50 mg QAM, 25 mg QPM per pt    . nitroGLYCERIN (NITROSTAT) 0.4 MG SL tablet Place 1 tablet (0.4 mg total) under the tongue every 5 (five) minutes as needed for chest pain. 25 tablet 3  . potassium chloride (K-DUR,KLOR-CON) 10 MEQ tablet Take 10 mEq by mouth daily.     . ranitidine (ZANTAC) 150 MG tablet Take 150 mg by mouth 2 (two) times daily.    . rosuvastatin (CRESTOR) 10 MG tablet Take 10 mg by mouth every 3 (three) days.      No current  facility-administered medications for this visit.     Past Medical History:  Diagnosis Date  . Arthritis    osteoarthritis-hips,knees, back, hands  . Asymmetric septal hypertrophy (HCC)   . Atrial fibrillation (HCC)   . CAD (coronary artery disease)    2 drug-eluting stents placed in the circumflex leading into a marginal.  May 2017.  [name]  . Cataract    corrective surgery done  . Fatty liver   . Gallstones   . GERD (gastroesophageal reflux disease)   . Heart palpitations   . Hepatitis    hepatitis A; past hx.,many yrs ago ? food source  . Hernia, hiatal   . Hyperlipidemia   . Hypertension   . Obesity   . Sleep apnea    CPAP     Past Surgical History:  Procedure Laterality Date  . APPENDECTOMY  2004  . CATARACT EXTRACTION W/ INTRAOCULAR LENS IMPLANT     both eyes  . CHOLECYSTECTOMY    . NM MYOCAR PERF WALL MOTION  08/15/04   No ischemia  . RETINAL DETACHMENT SURGERY Bilateral    remains with slight hazy vision with lights  . TOTAL HIP ARTHROPLASTY Right 08/04/2014   Procedure: RIGHT TOTAL HIP ARTHROPLASTY ANTERIOR APPROACH;  Surgeon: Loanne Drilling, MD;  Location: [facility];  Service: Orthopedics;  Laterality: Right;  . TOTAL HIP ARTHROPLASTY Left 01/05/2015   Procedure: LEFT TOTAL HIP ARTHROPLASTY ANTERIOR APPROACH;  Surgeon: Loanne Drilling, MD;  Location: [facility];  Service: Orthopedics;  Laterality: Left;  . US ECHOCARDIOGRAPHY  06/25/2012   Moderate ASH,LV hyperdynamic,LA is mod. dilated,trace MR,TR,AI    ROS:     As stated in the HPI and negative for all other systems.  PHYSICAL EXAM BP (!) 142/66   Pulse 74   Ht 5\' 8"  (1.727 m)   Wt 291 lb (132 kg)   BMI 44.25 kg/m   GENERAL:  Well appearing NECK:  No jugular venous distention, waveform within normal limits, carotid upstroke brisk and symmetric, no bruits, no thyromegaly LUNGS:  Clear to auscultation bilaterally CHEST:  Unremarkable HEART:  PMI not displaced or sustained,S1 and S2 within normal limits, no S3, no S4, no clicks, no rubs, 3 out of 6 apical systolic murmur radiating out the outflow tract and into the carotids, no diastolic murmurs ABD:  Flat, positive bowel sounds normal in frequency in pitch, no bruits, no rebound, no guarding, no midline pulsatile mass, no hepatomegaly, no splenomegaly EXT:  2 plus pulses throughout, no edema, no cyanosis no clubbing   Lab Results  Component Value Date   CHOL 193 03/25/2017   TRIG 289 (H) 03/25/2017   HDL 32 (L) 03/25/2017   LDLCALC 103 (H) 03/25/2017    ASSESSMENT AND PLAN  CAD:  She has no chest pain.  She can stop her Effient.    She needs continued risk reduction.   Hypertrophic  cardiomyopathy - There was no evidence of this on echo.  No further imaging is planned for this.   AS - There was mild AS on echo last year.  I will follow up with an echo next year.   Dyslipidemia -  she has been able to tolerate a slightly higher dose of statin. She has instructions to get a repeat lipid profile and she's followed in the lipid clinic.  Hypertension  The blood pressure is at target. No change in medications is indicated. We will continue with therapeutic lifestyle changes (TLC).

## 2017-05-03 ENCOUNTER — Encounter (HOSPITAL_COMMUNITY): Payer: Self-pay

## 2017-05-03 ENCOUNTER — Ambulatory Visit (INDEPENDENT_AMBULATORY_CARE_PROVIDER_SITE_OTHER): Payer: Medicare Other | Admitting: Cardiology

## 2017-05-03 ENCOUNTER — Encounter: Payer: Self-pay | Admitting: Cardiology

## 2017-05-03 VITALS — BP 142/66 | HR 74 | Ht 68.0 in | Wt 291.0 lb

## 2017-05-03 DIAGNOSIS — E785 Hyperlipidemia, unspecified: Secondary | ICD-10-CM

## 2017-05-03 DIAGNOSIS — I350 Nonrheumatic aortic (valve) stenosis: Secondary | ICD-10-CM

## 2017-05-03 DIAGNOSIS — I2510 Atherosclerotic heart disease of native coronary artery without angina pectoris: Secondary | ICD-10-CM | POA: Diagnosis not present

## 2017-05-03 NOTE — Patient Instructions (Addendum)
Medication Instructions:  STOP- Effient   Labwork: None Ordered  Testing/Procedures: None Ordered  Follow-Up: Your physician wants you to follow-up in: 6 Months. You will receive a reminder letter in the mail two months in advance. If you don't receive a letter, please call our office to schedule the follow-up appointment.   Any Other Special Instructions Will Be Listed Below (If Applicable).   If you need a refill on your cardiac medications before your next appointment, please call your pharmacy.

## 2017-05-06 ENCOUNTER — Encounter (HOSPITAL_COMMUNITY): Payer: Self-pay

## 2017-05-08 ENCOUNTER — Encounter (HOSPITAL_COMMUNITY): Payer: Self-pay

## 2017-05-10 ENCOUNTER — Encounter (HOSPITAL_COMMUNITY): Payer: Self-pay

## 2017-05-13 ENCOUNTER — Encounter (HOSPITAL_COMMUNITY): Payer: Self-pay

## 2017-05-15 ENCOUNTER — Encounter (HOSPITAL_COMMUNITY): Payer: Self-pay

## 2017-05-17 ENCOUNTER — Encounter (HOSPITAL_COMMUNITY): Payer: Self-pay

## 2017-05-20 ENCOUNTER — Encounter (HOSPITAL_COMMUNITY): Payer: Self-pay

## 2017-05-22 ENCOUNTER — Encounter (HOSPITAL_COMMUNITY): Payer: Self-pay

## 2017-05-24 ENCOUNTER — Encounter (HOSPITAL_COMMUNITY): Payer: Self-pay

## 2017-05-25 ENCOUNTER — Other Ambulatory Visit: Payer: Self-pay | Admitting: Cardiology

## 2017-05-27 ENCOUNTER — Other Ambulatory Visit: Payer: Self-pay | Admitting: Cardiology

## 2017-05-28 NOTE — Telephone Encounter (Signed)
Rx(s) sent to pharmacy electronically.  

## 2017-06-04 ENCOUNTER — Other Ambulatory Visit: Payer: Self-pay | Admitting: Cardiology

## 2017-06-05 LAB — LIPID PANEL
Chol/HDL Ratio: 5.2 ratio — ABNORMAL HIGH (ref 0.0–4.4)
Cholesterol, Total: 162 mg/dL (ref 100–199)
HDL: 31 mg/dL — AB (ref >39)
LDL Calculated: 84 mg/dL (ref 0–99)
TRIGLYCERIDES: 234 mg/dL — AB (ref 0–149)
VLDL CHOLESTEROL CAL: 47 mg/dL — AB (ref 5–40)

## 2017-06-10 ENCOUNTER — Telehealth: Payer: Self-pay | Admitting: Pharmacist

## 2017-06-10 NOTE — Telephone Encounter (Signed)
Results of lipid panel discussed with patient. Current therapy included rosuvastatin 10mg  every other day. Patient tolerating therapy well.  Recommendation: 1. Cotinue to work on lifestyle modifications 2. Increase rosuvastatin to 10mg  4 times per week x 4 weeks, then to 5 days per week if able to tolerate.

## 2017-06-22 ENCOUNTER — Emergency Department (HOSPITAL_BASED_OUTPATIENT_CLINIC_OR_DEPARTMENT_OTHER)
Admission: EM | Admit: 2017-06-22 | Discharge: 2017-06-22 | Disposition: A | Discharge status: Home / Self Care | Payer: Medicare Other | Attending: Emergency Medicine | Admitting: Emergency Medicine

## 2017-06-22 ENCOUNTER — Encounter (HOSPITAL_BASED_OUTPATIENT_CLINIC_OR_DEPARTMENT_OTHER): Payer: Self-pay | Admitting: *Deleted

## 2017-06-22 ENCOUNTER — Emergency Department (HOSPITAL_BASED_OUTPATIENT_CLINIC_OR_DEPARTMENT_OTHER): Payer: Medicare Other

## 2017-06-22 DIAGNOSIS — I2510 Atherosclerotic heart disease of native coronary artery without angina pectoris: Secondary | ICD-10-CM | POA: Diagnosis not present

## 2017-06-22 DIAGNOSIS — Z7982 Long term (current) use of aspirin: Secondary | ICD-10-CM | POA: Diagnosis not present

## 2017-06-22 DIAGNOSIS — N3091 Cystitis, unspecified with hematuria: Secondary | ICD-10-CM | POA: Diagnosis not present

## 2017-06-22 DIAGNOSIS — R319 Hematuria, unspecified: Secondary | ICD-10-CM | POA: Diagnosis present

## 2017-06-22 DIAGNOSIS — Z79899 Other long term (current) drug therapy: Secondary | ICD-10-CM | POA: Insufficient documentation

## 2017-06-22 DIAGNOSIS — I10 Essential (primary) hypertension: Secondary | ICD-10-CM | POA: Insufficient documentation

## 2017-06-22 DIAGNOSIS — Z87891 Personal history of nicotine dependence: Secondary | ICD-10-CM | POA: Diagnosis not present

## 2017-06-22 DIAGNOSIS — Z96643 Presence of artificial hip joint, bilateral: Secondary | ICD-10-CM | POA: Diagnosis not present

## 2017-06-22 LAB — CBC WITH DIFFERENTIAL/PLATELET
BASOS ABS: 0 10*3/uL (ref 0.0–0.1)
BASOS PCT: 0 %
EOS ABS: 0.2 10*3/uL (ref 0.0–0.7)
Eosinophils Relative: 1 %
HCT: 42.3 % (ref 36.0–46.0)
HEMOGLOBIN: 14.6 g/dL (ref 12.0–15.0)
Lymphocytes Relative: 15 %
Lymphs Abs: 1.9 10*3/uL (ref 0.7–4.0)
MCH: 30.7 pg (ref 26.0–34.0)
MCHC: 34.5 g/dL (ref 30.0–36.0)
MCV: 88.9 fL (ref 78.0–100.0)
Monocytes Absolute: 0.8 10*3/uL (ref 0.1–1.0)
Monocytes Relative: 7 %
NEUTROS PCT: 77 %
Neutro Abs: 9.4 10*3/uL — ABNORMAL HIGH (ref 1.7–7.7)
PLATELETS: 208 10*3/uL (ref 150–400)
RBC: 4.76 MIL/uL (ref 3.87–5.11)
RDW: 13.9 % (ref 11.5–15.5)
WBC: 12.3 10*3/uL — AB (ref 4.0–10.5)

## 2017-06-22 LAB — BASIC METABOLIC PANEL
Anion gap: 11 (ref 5–15)
BUN: 14 mg/dL (ref 6–20)
CALCIUM: 9 mg/dL (ref 8.9–10.3)
CHLORIDE: 103 mmol/L (ref 101–111)
CO2: 25 mmol/L (ref 22–32)
CREATININE: 0.94 mg/dL (ref 0.44–1.00)
GFR, EST NON AFRICAN AMERICAN: 59 mL/min — AB (ref >60)
Glucose, Bld: 137 mg/dL — ABNORMAL HIGH (ref 65–99)
Potassium: 3.8 mmol/L (ref 3.5–5.1)
SODIUM: 139 mmol/L (ref 135–145)

## 2017-06-22 LAB — URINALYSIS, MICROSCOPIC (REFLEX)

## 2017-06-22 LAB — URINALYSIS, ROUTINE W REFLEX MICROSCOPIC

## 2017-06-22 MED ORDER — CIPROFLOXACIN HCL 500 MG PO TABS: 500.0000 mg | ORAL_TABLET | Freq: Two times a day (BID) | ORAL | 0 refills | Status: DC

## 2017-06-22 MED ORDER — PHENAZOPYRIDINE HCL 200 MG PO TABS
200.0000 mg | ORAL_TABLET | Freq: Three times a day (TID) | ORAL | 0 refills | Status: DC
Start: 2017-06-22 — End: 2017-10-11

## 2017-06-22 NOTE — Discharge Instructions (Signed)
Cipro and Pyridium as prescribed.  Follow-up with your primary Dr. if not improving in the next few days, and return to the emergency department if your symptoms significantly worsen or change.

## 2017-06-22 NOTE — ED Notes (Signed)
Attempted phlebotomy stick without success

## 2017-06-22 NOTE — ED Provider Notes (Signed)
[facility] Provider Note   CSN: [id number] Arrival date & time: 06/22/17  4098     History   Chief Complaint Chief Complaint  Patient presents with  . Hematuria    HPI Victoria Delgado is a 76 y.o. female.  Patient is a 76 year old female with past medical history of coronary artery disease, atrial fibrillation, anemia. She presents today for evaluation of hematuria. She reports waking up this morning with bladder pressure in an urge to void. When she urinated, she noticed it was mostly blood. She denies any fevers or chills. She does report some bladder pressure. She has no prior history of hysterectomy, however denies that this is coming from her vagina. She says this feels like a urinary tract infection.   The history is provided by the patient.  Hematuria  This is a new problem. The current episode started less than 1 hour ago. The problem occurs constantly. The problem has not changed since onset.Pertinent negatives include no abdominal pain. Nothing aggravates the symptoms. Nothing relieves the symptoms. She has tried nothing for the symptoms.    Past Medical History:  Diagnosis Date  . Arthritis    osteoarthritis-hips,knees, back, hands  . Asymmetric septal hypertrophy (HCC)   . Atrial fibrillation (HCC)   . CAD (coronary artery disease)    2 drug-eluting stents placed in the circumflex leading into a marginal.  May 2017.  [name]  . Cataract    corrective surgery done  . Fatty liver   . Gallstones   . GERD (gastroesophageal reflux disease)   . Heart palpitations   . Hepatitis    hepatitis A; past hx.,many yrs ago ? food source  . Hernia, hiatal   . Hyperlipidemia   . Hypertension   . Obesity   . Sleep apnea    CPAP    Patient Active Problem List   Diagnosis Date Noted  . PAF (paroxysmal atrial fibrillation) (HCC) 05/03/2015  . OA (osteoarthritis) of hip 08/04/2014  . Edema 08/17/2013  . H/O bone density study 03/16/2013  . 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 Unspecified hereditary and idiopathic peripheral neuropathy 03/05/2013  . Unspecified vitamin D deficiency 03/05/2013  . Fatty infiltration of liver 02/08/2013  . Mixed hyperlipidemia 02/08/2013  . Morbid obesity (HCC) 02/08/2013  . Cellulitis of groin, right 12/30/2012  . DJD (degenerative joint disease) 12/17/2012  . GERD (gastroesophageal reflux disease) 12/17/2012  . Peripheral neuropathy 12/17/2012  . HOCM (hypertrophic obstructive cardiomyopathy) (HCC) 10/16/2012  . HTN (hypertension) 10/16/2012  . Sleep apnea 10/16/2012  . Heart palpitations     Past Surgical History:  Procedure Laterality Date  . APPENDECTOMY  2004  . CATARACT EXTRACTION W/ INTRAOCULAR LENS IMPLANT     both eyes  . CHOLECYSTECTOMY    . NM MYOCAR PERF WALL MOTION  08/15/04   No ischemia  . RETINAL DETACHMENT SURGERY Bilateral    remains with slight hazy vision with lights  . TOTAL HIP ARTHROPLASTY Right 08/04/2014   Procedure: RIGHT TOTAL HIP ARTHROPLASTY ANTERIOR APPROACH;  Surgeon: Loanne Drilling, MD;  Location: [facility];  Service: Orthopedics;  Laterality: Right;  . TOTAL HIP ARTHROPLASTY Left 01/05/2015   Procedure: LEFT TOTAL HIP ARTHROPLASTY ANTERIOR APPROACH;  Surgeon: Loanne Drilling, MD;  Location: [facility];  Service: Orthopedics;  Laterality: Left;  . US ECHOCARDIOGRAPHY  06/25/2012   Moderate ASH,LV hyperdynamic,LA is mod. dilated,trace MR,TR,AI    OB History    Gravida Para Term Preterm AB Living   2  2           SAB TAB Ectopic Multiple Live Births                   Home Medications    Prior to Admission medications   Medication Sig Start Date End Date Taking? Authorizing Provider  acetaminophen (TYLENOL) 500 MG tablet Take 500 mg by mouth every 6 (six) hours as needed for mild pain.    [provider]  aspirin EC 81 MG tablet Take 81 mg by mouth daily.    [provider]  CARTIA XT 240 MG 24 hr capsule TAKE 1 CAPSULE(240 MG) BY MOUTH AT BEDTIME  05/27/17   Rollene Rotunda, MD  furosemide (LASIX) 40 MG tablet TAKE 1 TABLET BY MOUTH EVERY DAY WITH POTASSIUM PILL 05/27/17   Rollene Rotunda, MD  nitroGLYCERIN (NITROSTAT) 0.4 MG SL tablet Place 1 tablet (0.4 mg total) under the tongue every 5 (five) minutes as needed for chest pain. 05/07/16   Rosalio Macadamia, NP  potassium chloride (K-DUR,KLOR-CON) 10 MEQ tablet Take 10 mEq by mouth daily.     [provider]  ranitidine (ZANTAC) 150 MG tablet Take 150 mg by mouth 2 (two) times daily.    [provider]  rosuvastatin (CRESTOR) 10 MG tablet Take 10 mg by mouth every 3 (three) days.     [provider]  TOPROL XL 50 MG 24 hr tablet TAKE 1 TABLET BY MOUTH EVERY MORNING AND 1/2 TABLET EVERY EVENING 05/28/17   Rollene Rotunda, MD    Family History Family History  Problem Relation Age of Onset  . Hypertension Mother   . CVA Mother   . Lung cancer Father 22       asbestos exposure  . Esophageal cancer Other        nephew  . Diabetes Other        nephew  . Colon cancer Neg Hx   . Pancreatic cancer Neg Hx   . Kidney disease Neg Hx   . Liver disease Neg Hx     Social History Social History  Substance Use Topics  . Smoking status: Former Games developer  . Smokeless tobacco: Never Used     Comment: QUIT IN 18  . Alcohol use No     Allergies   Other; Atorvastatin; Codeine; Kenalog [triamcinolone acetonide]; Relafen [nabumetone]; Penicillins; and Tramadol   Review of Systems Review of Systems  Gastrointestinal: Negative for abdominal pain.  Genitourinary: Positive for hematuria.  All other systems reviewed and are negative.    Physical Exam Updated Vital Signs BP (!) 137/59 (BP Location: Left Arm)   Pulse 70   Temp 98.3 F (36.8 C) (Oral)   Resp 20   Ht 5\' 8"  (1.727 m)   Wt 129.3 kg (285 lb)   SpO2 98%   BMI 43.33 kg/m   Physical Exam  Constitutional: She is oriented to person, place, and time. She appears well-developed and well-nourished. No  distress.  HENT:  Head: Normocephalic and atraumatic.  Neck: Normal range of motion. Neck supple.  Cardiovascular: Normal rate and regular rhythm.  Exam reveals no gallop and no friction rub.   Murmur heard. There is a 2/6 systolic ejection murmur heard best at the right upper sternal border.  Pulmonary/Chest: Effort normal and breath sounds normal. No respiratory distress. She has no wheezes.  Abdominal: Soft. Bowel sounds are normal. She exhibits no distension. There is no tenderness.  Musculoskeletal: Normal range of motion.  Neurological:  She is alert and oriented to person, place, and time.  Skin: Skin is warm and dry. She is not diaphoretic.  Nursing note and vitals reviewed.    ED Treatments / Results  Labs (all labs ordered are listed, but only abnormal results are displayed) Labs Reviewed  URINALYSIS, ROUTINE W REFLEX MICROSCOPIC    EKG  EKG Interpretation None       Radiology No results found.  Procedures Procedures (including critical care time)  Medications Ordered in ED Medications - No data to display   Initial Impression / Assessment and Plan / ED Course  I have reviewed the triage vital signs and the nursing notes.  Pertinent labs & imaging results that were available during my care of the patient were reviewed by me and considered in my medical decision making (see chart for details).  Patient presents with urinary frequency, dysuria, and hematuria appears to be related to a hemorrhagic cystitis. Her urinalysis was frankly bloody. Blood counts are otherwise unremarkable with the exception of a mild leukocytosis of 12,000. CT shows no evidence for renal mass, obstructing calculus, or other urologic pathology. She will be discharged with Cipro, Pyridium, and follow-up as needed.  Final Clinical Impressions(s) / ED Diagnoses   Final diagnoses:  None    New Prescriptions New Prescriptions   No medications on file     Geoffery Lyons, MD 06/22/17  [phone]

## 2017-06-22 NOTE — ED Triage Notes (Signed)
Pt woke this am with blood in urine. As well as frequency urgency and pain upon urination denies fevers flank pain or N/V

## 2017-06-25 ENCOUNTER — Encounter: Payer: Self-pay | Admitting: Internal Medicine

## 2017-07-02 ENCOUNTER — Other Ambulatory Visit: Payer: Self-pay | Admitting: Internal Medicine

## 2017-07-02 DIAGNOSIS — E279 Disorder of adrenal gland, unspecified: Secondary | ICD-10-CM

## 2017-07-24 ENCOUNTER — Other Ambulatory Visit: Payer: Self-pay

## 2017-07-24 ENCOUNTER — Ambulatory Visit
Admission: RE | Admit: 2017-07-24 | Discharge: 2017-07-24 | Disposition: A | Discharge status: Home / Self Care | Payer: Medicare Other | Source: Ambulatory Visit | Attending: Internal Medicine | Admitting: Internal Medicine

## 2017-07-24 DIAGNOSIS — E279 Disorder of adrenal gland, unspecified: Secondary | ICD-10-CM

## 2017-07-31 ENCOUNTER — Telehealth: Payer: Self-pay | Admitting: Internal Medicine

## 2017-07-31 NOTE — Telephone Encounter (Signed)
Dr. Lonell Face office called in reference to referral that was faxed over Monday 07/29/17. Please call Dr. Lonell Face office and advise at [phone]

## 2017-07-31 NOTE — Telephone Encounter (Signed)
Have we seen the referral for this??   Thanks!

## 2017-08-01 ENCOUNTER — Telehealth: Payer: Self-pay | Admitting: Internal Medicine

## 2017-08-01 NOTE — Telephone Encounter (Signed)
We haven't received it yet

## 2017-08-01 NOTE — Telephone Encounter (Signed)
Patient returning call about referral. Call patient back to advise. It may have been Elease Hashimoto. Call dropped.

## 2017-08-02 NOTE — Telephone Encounter (Signed)
Pt is schedule for 10/22

## 2017-09-20 ENCOUNTER — Encounter: Payer: Self-pay | Admitting: Cardiology

## 2017-09-23 ENCOUNTER — Ambulatory Visit: Payer: Self-pay | Admitting: Internal Medicine

## 2017-10-08 ENCOUNTER — Ambulatory Visit: Payer: Medicare Other | Admitting: Cardiology

## 2017-10-10 NOTE — Progress Notes (Signed)
HPI The patient presents for follow up of CAD.  She has atrial fibrillation which she ultimately thinks was related to a steroid injection. This was in 2008. At that time she was found to have asymmetric septal hypertrophy. This has been followed conservatively.  Echocardiogram did demonstrate concentric LV hypertrophy which was moderate and moderate LAE.  In May of last year she was in the hospital with CAD.  [facility] after waking up with left arm pain.  She had positive troponin with  PCI to the mid LCX and 1st DX. She is on DAPT with aspirin/Effient. She had 2 drug-eluting stents placed in the circumflex leading into a marginal. This was apparently somewhat small circumflex. She also had a diagonal had a drug-eluting stent placed. She had a preserved ejection fraction. An echo in our office showed well preserved ejection fraction. There was some moderate aortic stenosis. There was no mention of hypertrophic obstruction although there is LVH.  Since I last saw her she continues to get occasional palpitations.  She is mostly been bothered by back problems and leg weakness and she is being seen by orthopedics for this.  She is doing some of this on her statins but it turns out she is not having any increased symptoms if she does not take this medication.  He is taking a somewhat reduced dose right now.  Her sugars have been up and she possibly needing to take medications.  She has had no arm discomfort similar to her previous angina.  However, she has been limited].  She is never really had any chest pressure she is not having presyncope or syncope.  She is or orthopnea  Allergies  Allergen Reactions  . Other Other (See Comments)    Gas bubble C3F8 has green bracelet **  . Atorvastatin Other (See Comments)    myalgias  . Codeine Nausea Only  . Kenalog [Triamcinolone Acetonide] Other (See Comments)    Had A.fib after she received the injection."Steroid meds"  .  Relafen [Nabumetone] Other (See Comments)    Edema   . Penicillins Rash    40 years ago, injection site was red and swollen.   Has patient had a PCN reaction causing immediate rash, facial/tongue/throat swelling, SOB or lightheadedness with hypotension: YES Has patient had a PCN reaction causing severe rash involving mucus membranes or skin necrosis: NO Has patient had a PCN reaction that required hospitalization. No  Has patient had a PCN reaction occurring within the last 10 years: NO If all of the above answers are "NO", then may proceed with Cephalosporin use.    . Tramadol Other (See Comments)    Auditory halllucinations    Current Outpatient Medications  Medication Sig Dispense Refill  . acetaminophen (TYLENOL) 500 MG tablet Take 500 mg by mouth every 6 (six) hours as needed for mild pain.    Marland Kitchen. aspirin EC 81 MG tablet Take 81 mg by mouth daily.    Marland Kitchen. CARTIA XT 240 MG 24 hr capsule TAKE 1 CAPSULE(240 MG) BY MOUTH AT BEDTIME 90 capsule 3  . furosemide (LASIX) 40 MG tablet TAKE 1 TABLET BY MOUTH EVERY DAY WITH POTASSIUM PILL 90 tablet 3  . nitroGLYCERIN (NITROSTAT) 0.4 MG SL tablet Place 1 tablet (0.4 mg total) under the tongue every 5 (five) minutes as needed for chest pain. 25 tablet 3  . potassium chloride (K-DUR,KLOR-CON) 10 MEQ tablet Take 10 mEq by mouth daily.     .Marland Kitchen  ranitidine (ZANTAC) 150 MG tablet Take 150 mg by mouth 2 (two) times daily.    . rosuvastatin (CRESTOR) 10 MG tablet Take 1 tablet (10 mg total) every 3 (three) days by mouth. 90 tablet 3  . TOPROL XL 50 MG 24 hr tablet TAKE 1 TABLET BY MOUTH EVERY MORNING AND 1/2 TABLET EVERY EVENING 135 tablet 2   No current facility-administered medications for this visit.     Past Medical History:  Diagnosis Date  . Arthritis    osteoarthritis-hips,knees, back, hands  . Asymmetric septal hypertrophy (HCC)   . Atrial fibrillation (HCC)   . CAD (coronary artery disease)    2 drug-eluting stents placed in the circumflex  leading into a marginal.  May 2017.  [name]  . Cataract    corrective surgery done  . Fatty liver   . Gallstones   . GERD (gastroesophageal reflux disease)   . Heart palpitations   . Hepatitis    hepatitis A; past hx.,many yrs ago ? food source  . Hernia, hiatal   . Hyperlipidemia   . Hypertension   . Obesity   . Sleep apnea    CPAP    Past Surgical History:  Procedure Laterality Date  . APPENDECTOMY  2004  . CATARACT EXTRACTION W/ INTRAOCULAR LENS IMPLANT     both eyes  . CHOLECYSTECTOMY    . NM MYOCAR PERF WALL MOTION  08/15/04   No ischemia  . RETINAL DETACHMENT SURGERY Bilateral    remains with slight hazy vision with lights  . US ECHOCARDIOGRAPHY  06/25/2012   Moderate ASH,LV hyperdynamic,LA is mod. dilated,trace MR,TR,AI    ROS:     As stated in the HPI and negative for all other systems.  PHYSICAL EXAM BP 130/70   Pulse 62   Ht 5\' 8"  (1.727 m)   Wt 285 lb (129.3 kg)   SpO2 97%   BMI 43.33 kg/m   GENERAL:  Well appearing NECK:  No jugular venous distention, waveform within normal limits, carotid upstroke brisk and symmetric, no bruits, no thyromegaly LUNGS:  Clear to auscultation bilaterally CHEST:  Unremarkable HEART:  PMI not displaced or sustained,S1 and S2 within normal limits, no S3, no S4, no clicks, no rubs, 2 out of 6 apical systolic murmur not increasing with the strain phase of Valsalva, no diastolic murmurs ABD:  Flat, positive bowel sounds normal in frequency in pitch, no bruits, no rebound, no guarding, no midline pulsatile mass, no hepatomegaly, no splenomegaly, morbid obesity, EXT:  2 plus pulses throughout, trace edema, no cyanosis no clubbing   Lab Results  Component Value Date   CHOL 162 06/04/2017   TRIG 234 (H) 06/04/2017   HDL 31 (L) 06/04/2017   LDLCALC 84 06/04/2017   EKG: Sinus rhythm, rate 62, axis within normal limits, intervals, premature ventricular contractions, no acute ST-T wave changes.  10/11/17  ASSESSMENT AND  PLAN  CAD:  The patient has no new sypmtoms.  No further cardiovascular testing is indicated.  We will continue with aggressive risk reduction and meds as listed.  Hypertrophic cardiomyopathy - There was no evidence of dynamic outflow obstruction but there was some ventricular hypertrophy.  I am going to repeat an echocardiogram to further evaluate.  I will have a low threshold for MRI if there is any change in this or significant hypertrophy that I think is out of proportion to her LVH.   AS - There was mild AS on echo last year.  If this will be  followed as above.  Dyslipidemia - She is tolerating low-dose statin and she will continue with meds as listed.she's followed in the lipid clinic.  Hypertension  The blood pressure is at target.  No change in therapy is indicated.

## 2017-10-11 ENCOUNTER — Encounter: Payer: Self-pay | Admitting: Cardiology

## 2017-10-11 ENCOUNTER — Ambulatory Visit: Payer: Medicare Other | Admitting: Cardiology

## 2017-10-11 VITALS — BP 130/70 | HR 62 | Ht 68.0 in | Wt 285.0 lb

## 2017-10-11 DIAGNOSIS — I10 Essential (primary) hypertension: Secondary | ICD-10-CM

## 2017-10-11 DIAGNOSIS — I350 Nonrheumatic aortic (valve) stenosis: Secondary | ICD-10-CM | POA: Insufficient documentation

## 2017-10-11 DIAGNOSIS — E785 Hyperlipidemia, unspecified: Secondary | ICD-10-CM

## 2017-10-11 DIAGNOSIS — I517 Cardiomegaly: Secondary | ICD-10-CM | POA: Insufficient documentation

## 2017-10-11 DIAGNOSIS — I2510 Atherosclerotic heart disease of native coronary artery without angina pectoris: Secondary | ICD-10-CM | POA: Diagnosis not present

## 2017-10-11 MED ORDER — ROSUVASTATIN CALCIUM 10 MG PO TABS: 10.0000 mg | ORAL_TABLET | ORAL | 3 refills | Status: DC

## 2017-10-11 NOTE — Patient Instructions (Signed)
Medication Instructions:  Continue current medications  If you need a refill on your cardiac medications before your next appointment, please call your pharmacy.  Labwork: None Ordered   Testing/Procedures: Your physician has requested that you have an echocardiogram. Echocardiography is a painless test that uses sound waves to create images of your heart. It provides your doctor with information about the size and shape of your heart and how well your heart's chambers and valves are working. This procedure takes approximately one hour. There are no restrictions for this procedure.  Follow-Up: Your physician wants you to follow-up in: 6 Months. You should receive a reminder letter in the mail two months in advance. If you do not receive a letter, please call our office [phone].     Thank you for choosing [facility]!!      

## 2017-10-15 ENCOUNTER — Telehealth: Payer: Self-pay | Admitting: Cardiology

## 2017-10-15 ENCOUNTER — Other Ambulatory Visit: Payer: Self-pay | Admitting: *Deleted

## 2017-10-15 MED ORDER — ROSUVASTATIN CALCIUM 10 MG PO TABS: 10.0000 mg | ORAL_TABLET | ORAL | 3 refills | Status: DC

## 2017-10-15 MED ORDER — CRESTOR 10 MG PO TABS: 10.0000 mg | ORAL_TABLET | ORAL | 3 refills | Status: DC

## 2017-10-15 NOTE — Telephone Encounter (Signed)
Spoke with patient and she states that she was told to take her crestor daily as tolerated and that she is now up to almost being able to take it daily. Patient is requesting a prescription for brand name only crestor 10 mg daily to be sent to her pharmacy. Patient did confirm that she is followed in the lipid clinic for her cholesterol.

## 2017-10-15 NOTE — Telephone Encounter (Signed)
Pt says she just picked up her Crestor at the pharmacist and it was wrong.

## 2017-10-16 NOTE — Telephone Encounter (Signed)
OK to fill as requested.

## 2017-10-30 ENCOUNTER — Ambulatory Visit (HOSPITAL_COMMUNITY): Payer: Medicare Other | Attending: Cardiology

## 2017-10-30 ENCOUNTER — Other Ambulatory Visit: Payer: Self-pay

## 2017-10-30 DIAGNOSIS — I350 Nonrheumatic aortic (valve) stenosis: Secondary | ICD-10-CM | POA: Diagnosis not present

## 2017-10-30 DIAGNOSIS — I517 Cardiomegaly: Secondary | ICD-10-CM | POA: Diagnosis not present

## 2017-10-30 DIAGNOSIS — I2510 Atherosclerotic heart disease of native coronary artery without angina pectoris: Secondary | ICD-10-CM | POA: Insufficient documentation

## 2017-10-30 DIAGNOSIS — I10 Essential (primary) hypertension: Secondary | ICD-10-CM | POA: Insufficient documentation

## 2017-10-30 DIAGNOSIS — E785 Hyperlipidemia, unspecified: Secondary | ICD-10-CM | POA: Insufficient documentation

## 2017-11-05 ENCOUNTER — Encounter: Payer: Self-pay | Admitting: Internal Medicine

## 2017-11-05 ENCOUNTER — Ambulatory Visit: Payer: Medicare Other | Admitting: Internal Medicine

## 2017-11-05 VITALS — BP 138/70 | HR 60 | Wt 288.6 lb

## 2017-11-05 DIAGNOSIS — E279 Disorder of adrenal gland, unspecified: Secondary | ICD-10-CM

## 2017-11-05 DIAGNOSIS — E278 Other specified disorders of adrenal gland: Secondary | ICD-10-CM | POA: Insufficient documentation

## 2017-11-05 LAB — POTASSIUM: Potassium: 4.5 mEq/L (ref 3.5–5.1)

## 2017-11-05 NOTE — Patient Instructions (Signed)
Please stop at the lab.  Patient information (Up-to-Date): Collection of a 24-hour urine specimen   - You should collect every drop of urine during each 24-hour period. It does not matter how much or little urine is passed each time, as long as every drop is collected. - Begin the urine collection in the morning after you wake up, after you have emptied your bladder for the first time. - Urinate (empty the bladder) for the first time and flush it down the toilet. Note the exact time (eg, 6:15 AM). You will begin the urine collection at this time. - Collect every drop of urine during the day and night in an empty collection bottle. Store the bottle at room temperature or in the refrigerator. - If you need to have a bowel movement, any urine passed with the bowel movement should be collected. Try not to include feces with the urine collection. If feces does get mixed in, do not try to remove the feces from the urine collection bottle. - Finish by collecting the first urine passed the next morning, adding it to the collection bottle. This should be within ten minutes before or after the time of the first morning void on the first day (which was flushed). In this example, you would try to void between 6:05 and 6:25 on the second day. - If you need to urinate one hour before the final collection time, drink a full glass of water so that you can void again at the appropriate time. If you have to urinate 20 minutes before, try to hold the urine until the proper time. - Please note the exact time of the final collection, even if it is not the same time as when collection began on day 1. - The bottle(s) may be kept at room temperature for a day or two, but should be kept cool or refrigerated for longer periods of time.  Read the following books: Dr. Suzzette Righter - Prevent and Reverse Heart Disease Dr. Lequita Asal - Program for Reversing Diabetes Rip Neomia Dear - The Engine 2 diet  Please watch:  "[employer]".   Please come back for a follow-up appointment in 1 year.

## 2017-11-05 NOTE — Progress Notes (Signed)
Patient ID: Victoria Delgado, female   DOB: [birth year], 76 y.o.   MRN: [id number]    HPI  Victoria Delgado is a 76 y.o.-year-old female, referred by her PCP, Dr. 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  Pt's adrenal mass was incidentally found during investigation for hemorrhagic cystitis. She has a long standing h/o back pain.  I reviewed pt's previous imaging test reports and images: Abdominal CT (06/22/2017): 2.5 cm R adrenal nodule, 15 HU, not present in 2004; likely adenoma. Abdominal MRI (07/24/2017): hypointense nodule with homogeneous T2 signal and signal dropout consistent with benign adenoma  She also has a h/o HTN >> at home, BP 120-125/75-80. She is on Metoprolol, Cardizem, Furosemide. She takes 10 mEq potassium daily and drinks 3 cups low-Na V8 juice.  Pt. also has a history of prediabetes  - developed after she started statin in the past. Latest HbA1c 6.3% reportedly.  She had a steroid inj for bursitis ~2008, not since. 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  She also has a h/o AMI - s/p stents  - 04/2016.  She had 3 episodes of retinal detachment.  ROS: Constitutional: no weight gain/no weight loss, no fatigue, no subjective hyperthermia, no subjective hypothermia Eyes: no blurry vision, no xerophthalmia ENT: no sore throat, no nodules palpated in throat, no dysphagia, no odynophagia, no hoarseness, + hypoacusis, + tinnitus Cardiovascular: no CP/+ SOB/+ palpitations/no leg swelling Respiratory: no cough/+ SOB/no wheezing Gastrointestinal: no N/no V/+ D/no C/+ acid reflux Musculoskeletal: + muscle aches/+ joint aches Skin: no rashes, no hair loss Neurological: no tremors/no numbness/no tingling/no dizziness Psychiatric: no depression/no anxiety  Past Medical History:  Diagnosis Date  . Arthritis    osteoarthritis-hips,knees, back, hands  . Asymmetric septal hypertrophy (HCC)   . Atrial fibrillation (HCC)   . CAD (coronary artery disease)    2  drug-eluting stents placed in the circumflex leading into a marginal.  May 2017.  [name]  . Cataract    corrective surgery done  . Fatty liver   . Gallstones   . GERD (gastroesophageal reflux disease)   . Heart palpitations   . Hepatitis    hepatitis A; past hx.,many yrs ago ? food source  . Hernia, hiatal   . Hyperlipidemia   . Hypertension   . Obesity   . Sleep apnea    CPAP   Past Surgical History:  Procedure Laterality Date  . APPENDECTOMY  2004  . CATARACT EXTRACTION W/ INTRAOCULAR LENS IMPLANT     both eyes  . CHOLECYSTECTOMY    . NM MYOCAR PERF WALL MOTION  08/15/04   No ischemia  . RETINAL DETACHMENT SURGERY Bilateral    remains with slight hazy vision with lights  . TOTAL HIP ARTHROPLASTY Right 08/04/2014   Procedure: RIGHT TOTAL HIP ARTHROPLASTY ANTERIOR APPROACH;  Surgeon: Loanne Drilling, MD;  Location: [facility];  Service: Orthopedics;  Laterality: Right;  . TOTAL HIP ARTHROPLASTY Left 01/05/2015   Procedure: LEFT TOTAL HIP ARTHROPLASTY ANTERIOR APPROACH;  Surgeon: Loanne Drilling, MD;  Location: [facility];  Service: Orthopedics;  Laterality: Left;  . US ECHOCARDIOGRAPHY  06/25/2012   Moderate ASH,LV hyperdynamic,LA is mod. dilated,trace MR,TR,AI   Social History   Socioeconomic History  . Marital status: Married    Spouse name: Not on file  . Number of children: 2  . Years of education: Not on file  . Highest education level: Not on file  Social Needs  . Financial resource strain: Not on file  .  Food insecurity - worry: Not on file  . Food insecurity - inability: Not on file  . Transportation needs - medical: Not on file  . Transportation needs - non-medical: Not on file  Occupational History    Employer: OTHER  Tobacco Use  . Smoking status: Former Games developer  . Smokeless tobacco: Never Used  . Tobacco comment: QUIT IN 1990  Substance and Sexual Activity  . Alcohol use: No    Alcohol/week: 0.0 oz  . Drug use: No  . Sexual activity: Yes  Other Topics  Concern  . Not on file  Social History Narrative   Two grandchildren.            Current Outpatient Medications on File Prior to Visit  Medication Sig Dispense Refill  . acetaminophen (TYLENOL) 500 MG tablet Take 500 mg by mouth every 6 (six) hours as needed for mild pain.    Marland Kitchen aspirin EC 81 MG tablet Take 81 mg by mouth daily.    Marland Kitchen CARTIA XT 240 MG 24 hr capsule TAKE 1 CAPSULE(240 MG) BY MOUTH AT BEDTIME 90 capsule 3  . CRESTOR 10 MG tablet Take 1 tablet (10 mg total) as directed by mouth. 90 tablet 3  . furosemide (LASIX) 40 MG tablet TAKE 1 TABLET BY MOUTH EVERY DAY WITH POTASSIUM PILL 90 tablet 3  . nitroGLYCERIN (NITROSTAT) 0.4 MG SL tablet Place 1 tablet (0.4 mg total) under the tongue every 5 (five) minutes as needed for chest pain. 25 tablet 3  . potassium chloride (K-DUR,KLOR-CON) 10 MEQ tablet Take 10 mEq by mouth daily.     . ranitidine (ZANTAC) 150 MG tablet Take 150 mg by mouth 2 (two) times daily.    . rosuvastatin (CRESTOR) 10 MG tablet Take 1 tablet (10 mg total) every 3 (three) days by mouth. 90 tablet 3  . TOPROL XL 50 MG 24 hr tablet TAKE 1 TABLET BY MOUTH EVERY MORNING AND 1/2 TABLET EVERY EVENING 135 tablet 2   No current facility-administered medications on file prior to visit.    Allergies  Allergen Reactions  . Other Other (See Comments)    Gas bubble C3F8 has green bracelet **  . Atorvastatin Other (See Comments)    myalgias  . Codeine Nausea Only  . Kenalog [Triamcinolone Acetonide] Other (See Comments)    Had A.fib after she received the injection."Steroid meds"  . Relafen [Nabumetone] Other (See Comments)    Edema   . Penicillins Rash    40 years ago, injection site was red and swollen.   Has patient had a PCN reaction causing immediate rash, facial/tongue/throat swelling, SOB or lightheadedness with hypotension: YES Has patient had a PCN reaction causing severe rash involving mucus membranes or skin necrosis: NO Has patient had a PCN reaction that  required hospitalization. No  Has patient had a PCN reaction occurring within the last 10 years: NO If all of the above answers are "NO", then may proceed with Cephalosporin use.    . Tramadol Other (See Comments)    Auditory halllucinations   Family History  Problem Relation Age of Onset  . Hypertension Mother   . CVA Mother   . Lung cancer Father 51       asbestos exposure  . Esophageal cancer Other        nephew  . Diabetes Other        nephew  . Colon cancer Neg Hx   . Pancreatic cancer Neg Hx   . Kidney disease Neg  Hx   . Liver disease Neg Hx    PE: BP 138/70   Pulse 60   Wt 288 lb 9.6 oz (130.9 kg)   SpO2 94%   BMI 43.88 kg/m  Wt Readings from Last 3 Encounters:  11/05/17 288 lb 9.6 oz (130.9 kg)  10/11/17 285 lb (129.3 kg)  06/22/17 285 lb (129.3 kg)   Constitutional: obese, in NAD, no full supraclavicular fat pads, no moon facies Eyes: PERRLA, EOMI, no exophthalmos, no lid lag, no stare ENT: moist mucous membranes, no thyromegaly, no cervical lymphadenopathy Cardiovascular: RRR, No RG, +2/6 SEM Respiratory: CTA B Gastrointestinal: abdomen soft, NT, ND, BS+ Musculoskeletal: no deformities, strength intact in all 4 Skin: moist, warm, no rashes, no violaceous striae Neurological: no tremor with outstretched hands, DTR normal in all 4  ASSESSMENT: 1. R Adrenal incidentaloma  2. Obesity class 3  PLAN:  1. Patient with a R adrenal nodule discovered incidentally. - We discussed about the fact that there are 3 possible scenarios: - A nonfunctioning adrenal nodule - A functioning adrenal adenoma - which can hypersecrete catecholamines/metanephrines, cortisol, or aldosterone - Adrenal cancer/metastasis We do not have blood tests to check for cancer, but the best indicator is lack of change in size and appearance over time. The nodule does not appear to be present on the Ct abdomen from 2004; however, per my review of the images, it is possible that the R adrenal  was not well visualized then to r/o a nodule. I discussed this with the pt today.  - To differentiate between a functioning and a nonfunctioning adrenal nodule, we'll need to rule out hypersecretion by checking the following tests  - she refuses a dexamethasone suppression test as she tells me she is very sensitive to exogenous steroids ( >> A fib) >> will check a 24h urine free cortisol to rule out Cushing syndrome (6% of adrenal incidentalomas). I explained the urine collection protocol and she also received written instructions. - Plasma fractionated metanephrines and catecholamines to rule out pheochromocytoma (3% of adrenal incidentalomas) - Plasma renin activity and aldosterone level to rule out primary hyperaldosteronism (0.6% of adrenal incidentalomas) - I ordered the above tests today. - We discussed about the need for an other CT scan, and I believe that we can wait for another year and then have her back for repeat hormonal testing and ordering a dedicated adrenal CT. Will order it with and without contrast, if the Hounsfield units are low and the lesion again appears benign, we might not need to get the contrasted CT for washout.  - we discussed about f/u:   hormonal testing yearly for 5 years   CT scans yearly x1-2 - I explained all the above to the patient, and she agrees with the plan. - I will see her back in a year  Orders Placed This Encounter  Procedures  . Aldosterone + renin activity w/ ratio  . Catecholamines, fractionated, plasma  . Creatinine, urine, 24 hour  . Metanephrines, plasma  . Potassium  . Cortisol, urine, 24 hour   2. Obesity class 3 - reviewed her weight and BMI >> severe obesity - she tried low fat, low carb diets, 1200 calorie diets >> no results - I suggested a plant based diet >> given references and explained the benefits  Office Visit on 11/05/2017  Component Date Value Ref Range Status  . Aldosterone, Serum 11/05/2017 4  ng/dL Final   Comment:  . Unable to flag abnormal result(s), please refer  to reference range(s) below: . Adult Reference Ranges for Aldosterone, LC/MS/MS:     Upright  8:00 - 10:00 am    < or = 28 ng/dL     Upright  1:61 -  0:96 pm    < or = 21 ng/dL     Supine   0:45 - 40:98 am       3 - 16 ng/dL .   Marland Kitchen Renin Activity 11/05/2017 0.88  0.25 - 5.82 ng/mL/h Final  . ALDO / PRA Ratio 11/05/2017 4.5  0.9 - 28.9 Ratio Final   Comment: . This test was developed and its analytical performance characteristics have been determined by [facility] [place], [place]. It has not been cleared or approved by the U.S. Food and Drug Administration. This assay has been validated pursuant to the CLIA regulations and is used for clinical purposes. .   . Epinephrine 11/05/2017 see note  pg/mL Final   Comment: Results are below reportable range for this analyte, which is 20 pg/mL. . This test was developed and its analytical performance characteristics have been determined by [facility] [place], [place]. It has not been cleared or approved by the U.S. Food and Drug Administration. This assay has been validated pursuant to the CLIA regulations and is used for clinical purposes. .   . Norepinephrine 11/05/2017 471  pg/mL Final   Comment: . This test was developed and its analytical performance characteristics have been determined by [facility] [place], [place]. It has not been cleared or approved by the U.S. Food and Drug Administration. This assay has been validated pursuant to the CLIA regulations and is used for clinical purposes. .   . Dopamine 11/05/2017 see note  pg/mL Final   Comment: Results are below reportable range for this analyte, which is 30 pg/mL. . This test was developed and its analytical performance characteristics have been determined by [facility] [place], [place]. It has not been cleared or approved by  the U.S. Food and Drug Administration. This assay has been validated pursuant to the CLIA regulations and is used for clinical purposes. .   . Catecholamines, Total 11/05/2017 471  pg/mL Final   Comment: . Adult Reference Ranges for Catecholamines, Plasma . Epinephrine         Supine:  LESS THAN 50 pg/mL                     Upright: LESS THAN 95 pg/mL . Norepinephrine      Supine:  112-658 pg/mL                     Upright: [phone] pg/mL . Dopamine            Supine:  LESS THAN 30 pg/mL                     Upright: LESS THAN 30 pg/mL . Total (N+E)         Supine:  123-671 pg/mL                     Upright: [phone] pg/mL . Pediatric Reference Ranges for Catecholamines, Plasma . Due to stress, plasma catecholamine levels are generally unreliable in infants and small children. Urinary catecholamine assays are more reliable. Marland Kitchen Epinephrine         Supine:  LESS THAN OR EQUAL    3-15 Years  TO 464 pg/mL                     Upright: Not Available . Norepinephrine      Supine:  LESS THAN OR EQUAL    3-15 Years                TO 1251 pg/mL                     Upright: Not Available . Dopamine            Supine:  LESS THAN 60 pg/mL    3-15 Years                                 Upright: Not Available . Pediatric data from [facility] [id number]. . This test was developed and its analytical performance characteristics have been determined by [facility] [place], [place]. It has not been cleared or approved by the U.S. Food and Drug Administration. This assay has been validated pursuant to the CLIA regulations and is used for clinical purposes. Armstead Peaks, Free 11/05/2017 27  <=57 pg/mL Final   Comment: . This test was developed and its analytical performance characteristics have been determined by [facility] [place], [place]. It has not been cleared or approved by the U.S. Food and  Drug Administration. This assay has been validated pursuant to the CLIA regulations and is used for clinical purposes. Marland Kitchen   Sheryn Bison, Free 11/05/2017 134  <=148 pg/mL Final   Comment: . This test was developed and its analytical performance characteristics have been determined by [facility] [place], [place]. It has not been cleared or approved by the U.S. Food and Drug Administration. This assay has been validated pursuant to the CLIA regulations and is used for clinical purposes. .   . Total Metanephrines-Plasma 11/05/2017 161  <=205 pg/mL Final   Comment: . For additional information, please refer to http://education.[facility].com/faq/MetFractFree (This link is being provided for informational/educatio informational/educational purposes only.) . Elevations >4-fold upper reference range: strongly suggestive of a pheochromocytoma(1). . Elevations >1- 4-fold upper reference range: significant but not diagnostic, may be due to medications or stress. Suggest running 24 hr urine fractionated metanephrines and/or serum Chromagranin A for confirmation. . Reference: . (1)Algeciras-Schimnich A et al, Plasma Chromogranin A or Urine Fractionated Metanephrines Follow-Up Testing Improves the Diagnostic Accuracy of Plasma Fractionated Metanephrines for Pheochromocytoma. The Journal of Clinical Endocrinology # Metabolism 93(1), 91-95, 2008. . . . This test was developed and its analytical performance characteristics have been determined by [facility] [place], [place]. It has not been cleared or a                          pproved by the U.S. Food and Drug Administration. This assay has been validated pursuant to the CLIA regulations and is used for clinical purposes. .   . Potassium 11/05/2017 4.5  3.5 - 5.1 mEq/L Final  . 24 Hour urine volume (VMAHVA) 11/08/2017 2,250  mL Final  . Cortisol (Ur), Free 11/08/2017 14.4   4.0 - 50.0 mcg/24 h Final   Comment: . Analysis performed by [employer] . This test was developed and its analytical performance characteristics have been determined by [facility]. It has not been cleared or approved  by FDA. This assay has been validated pursuant to the CLIA regulations and is used for clinical purposes.   Marland Kitchen RESULTS RECEIVED 11/08/2017 1.46  0.50 - 2.15 g/24 h Final   All labs are normal.  Carlus Pavlov, MD PhD [facility] Endocrinology

## 2017-11-11 ENCOUNTER — Telehealth: Payer: Self-pay | Admitting: Internal Medicine

## 2017-11-11 LAB — METANEPHRINES, PLASMA
Metanephrine, Free: 27 pg/mL (ref <=57)
NORMETANEPHRINE FREE: 134 pg/mL (ref <=148)
TOTAL METANEPHRINES-PLASMA: 161 pg/mL (ref <=205)

## 2017-11-11 LAB — CATECHOLAMINES, FRACTIONATED, PLASMA
Catecholamines, Total: 471 pg/mL
NOREPINEPHRINE: 471 pg/mL

## 2017-11-11 LAB — ALDOSTERONE + RENIN ACTIVITY W/ RATIO
ALDO / PRA RATIO: 4.5 ratio (ref 0.9–28.9)
ALDOSTERONE, SERUM: 4 ng/dL
RENIN ACTIVITY: 0.88 ng/mL/h (ref 0.25–5.82)

## 2017-11-11 LAB — TIQ-NTM

## 2017-11-11 NOTE — Telephone Encounter (Signed)
The patient's urine was collected in the wrong container [facility]  They requested that you please call the patient to recollecte and reorder the appropriate tests

## 2017-11-12 LAB — TIQ-MISC

## 2017-11-12 LAB — TIQ-NTM

## 2017-11-13 NOTE — Telephone Encounter (Signed)
Penny, I thought the jug was ok for cortisol, we were just talking about this... Can you please check with them and if another urine is needed, can you please let pt know. I sent her a message telling her that we may need a re-collection. Ty, C

## 2017-11-16 LAB — CORTISOL, URINE, 24 HOUR
24 HOUR URINE VOLUME (VMAHVA): 2250 mL
Cortisol (Ur), Free: 14.4 mcg/24 h (ref 4.0–50.0)
RESULTS RECEIVED: 1.46 g/(24.h) (ref 0.50–2.15)

## 2017-11-16 LAB — EXTRA URINE SPECIMEN

## 2017-12-27 ENCOUNTER — Other Ambulatory Visit: Payer: Self-pay | Admitting: Cardiology

## 2017-12-27 NOTE — Telephone Encounter (Signed)
Rx has been sent to the pharmacy electronically. ° °

## 2018-03-16 ENCOUNTER — Other Ambulatory Visit: Payer: Self-pay

## 2018-03-16 ENCOUNTER — Encounter (HOSPITAL_COMMUNITY): Payer: Self-pay

## 2018-03-16 ENCOUNTER — Observation Stay (HOSPITAL_COMMUNITY)
Admission: EM | Admit: 2018-03-16 | Discharge: 2018-03-19 | Disposition: A | Discharge status: Home / Self Care | Payer: Medicare Other | Attending: Cardiology | Admitting: Cardiology

## 2018-03-16 ENCOUNTER — Emergency Department (HOSPITAL_COMMUNITY): Payer: Medicare Other

## 2018-03-16 DIAGNOSIS — I493 Ventricular premature depolarization: Secondary | ICD-10-CM | POA: Insufficient documentation

## 2018-03-16 DIAGNOSIS — M609 Myositis, unspecified: Secondary | ICD-10-CM | POA: Diagnosis present

## 2018-03-16 DIAGNOSIS — Z955 Presence of coronary angioplasty implant and graft: Secondary | ICD-10-CM | POA: Insufficient documentation

## 2018-03-16 DIAGNOSIS — Z79899 Other long term (current) drug therapy: Secondary | ICD-10-CM | POA: Diagnosis not present

## 2018-03-16 DIAGNOSIS — I480 Paroxysmal atrial fibrillation: Secondary | ICD-10-CM | POA: Diagnosis present

## 2018-03-16 DIAGNOSIS — I421 Obstructive hypertrophic cardiomyopathy: Secondary | ICD-10-CM | POA: Insufficient documentation

## 2018-03-16 DIAGNOSIS — Z87891 Personal history of nicotine dependence: Secondary | ICD-10-CM | POA: Insufficient documentation

## 2018-03-16 DIAGNOSIS — I200 Unstable angina: Secondary | ICD-10-CM

## 2018-03-16 DIAGNOSIS — G4733 Obstructive sleep apnea (adult) (pediatric): Secondary | ICD-10-CM | POA: Insufficient documentation

## 2018-03-16 DIAGNOSIS — I10 Essential (primary) hypertension: Secondary | ICD-10-CM | POA: Insufficient documentation

## 2018-03-16 DIAGNOSIS — I252 Old myocardial infarction: Secondary | ICD-10-CM | POA: Insufficient documentation

## 2018-03-16 DIAGNOSIS — R609 Edema, unspecified: Secondary | ICD-10-CM | POA: Diagnosis present

## 2018-03-16 DIAGNOSIS — I2510 Atherosclerotic heart disease of native coronary artery without angina pectoris: Secondary | ICD-10-CM | POA: Insufficient documentation

## 2018-03-16 DIAGNOSIS — R079 Chest pain, unspecified: Secondary | ICD-10-CM | POA: Diagnosis present

## 2018-03-16 DIAGNOSIS — T466X5A Adverse effect of antihyperlipidemic and antiarteriosclerotic drugs, initial encounter: Secondary | ICD-10-CM | POA: Diagnosis not present

## 2018-03-16 DIAGNOSIS — I517 Cardiomegaly: Secondary | ICD-10-CM | POA: Diagnosis present

## 2018-03-16 DIAGNOSIS — G720 Drug-induced myopathy: Secondary | ICD-10-CM | POA: Diagnosis not present

## 2018-03-16 DIAGNOSIS — Z7982 Long term (current) use of aspirin: Secondary | ICD-10-CM | POA: Diagnosis not present

## 2018-03-16 DIAGNOSIS — Z880 Allergy status to penicillin: Secondary | ICD-10-CM | POA: Diagnosis not present

## 2018-03-16 DIAGNOSIS — G4730 Sleep apnea, unspecified: Secondary | ICD-10-CM | POA: Diagnosis present

## 2018-03-16 DIAGNOSIS — Z96643 Presence of artificial hip joint, bilateral: Secondary | ICD-10-CM | POA: Diagnosis not present

## 2018-03-16 DIAGNOSIS — R9439 Abnormal result of other cardiovascular function study: Secondary | ICD-10-CM

## 2018-03-16 DIAGNOSIS — E782 Mixed hyperlipidemia: Secondary | ICD-10-CM | POA: Diagnosis not present

## 2018-03-16 DIAGNOSIS — I214 Non-ST elevation (NSTEMI) myocardial infarction: Secondary | ICD-10-CM | POA: Diagnosis not present

## 2018-03-16 DIAGNOSIS — Z888 Allergy status to other drugs, medicaments and biological substances status: Secondary | ICD-10-CM | POA: Insufficient documentation

## 2018-03-16 DIAGNOSIS — K219 Gastro-esophageal reflux disease without esophagitis: Secondary | ICD-10-CM | POA: Diagnosis not present

## 2018-03-16 DIAGNOSIS — Z885 Allergy status to narcotic agent status: Secondary | ICD-10-CM | POA: Diagnosis not present

## 2018-03-16 DIAGNOSIS — R600 Localized edema: Secondary | ICD-10-CM | POA: Insufficient documentation

## 2018-03-16 DIAGNOSIS — I350 Nonrheumatic aortic (valve) stenosis: Secondary | ICD-10-CM

## 2018-03-16 LAB — BASIC METABOLIC PANEL
ANION GAP: 10 (ref 5–15)
BUN: 14 mg/dL (ref 6–20)
CALCIUM: 9.6 mg/dL (ref 8.9–10.3)
CHLORIDE: 105 mmol/L (ref 101–111)
CO2: 27 mmol/L (ref 22–32)
Creatinine, Ser: 0.89 mg/dL (ref 0.44–1.00)
GFR calc non Af Amer: >60 mL/min (ref >60)
Glucose, Bld: 155 mg/dL — ABNORMAL HIGH (ref 65–99)
POTASSIUM: 4.3 mmol/L (ref 3.5–5.1)
Sodium: 142 mmol/L (ref 135–145)

## 2018-03-16 LAB — I-STAT TROPONIN, ED
TROPONIN I, POC: 0.07 ng/mL (ref 0.00–0.08)
Troponin i, poc: 0.11 ng/mL (ref 0.00–0.08)

## 2018-03-16 LAB — CBC
HCT: 45.6 % (ref 36.0–46.0)
HEMOGLOBIN: 15.1 g/dL — AB (ref 12.0–15.0)
MCH: 30.2 pg (ref 26.0–34.0)
MCHC: 33.1 g/dL (ref 30.0–36.0)
MCV: 91.2 fL (ref 78.0–100.0)
Platelets: 204 10*3/uL (ref 150–400)
RBC: 5 MIL/uL (ref 3.87–5.11)
RDW: 13.6 % (ref 11.5–15.5)
WBC: 7 10*3/uL (ref 4.0–10.5)

## 2018-03-16 LAB — TROPONIN I: Troponin I: 0.1 ng/mL (ref <0.03)

## 2018-03-16 MED ORDER — DILTIAZEM HCL ER COATED BEADS 240 MG PO CP24
240.0000 mg | ORAL_CAPSULE | Freq: Every day | ORAL | Status: DC
  Administered 2018-03-16 – 2018-03-18 (×3): 240 mg via ORAL
  Filled 2018-03-16 (×3): qty 1

## 2018-03-16 MED ORDER — ASPIRIN EC 81 MG PO TBEC
81.0000 mg | DELAYED_RELEASE_TABLET | Freq: Every day | ORAL | Status: DC
  Administered 2018-03-17 – 2018-03-18 (×2): 81 mg via ORAL
  Filled 2018-03-16 (×2): qty 1

## 2018-03-16 MED ORDER — METOPROLOL SUCCINATE ER 50 MG PO TB24
50.0000 mg | ORAL_TABLET | Freq: Every day | ORAL | Status: DC
  Filled 2018-03-16 (×2): qty 1

## 2018-03-16 MED ORDER — ACETAMINOPHEN 500 MG PO TABS
500.0000 mg | ORAL_TABLET | Freq: Four times a day (QID) | ORAL | Status: DC | PRN
  Filled 2018-03-16: qty 1

## 2018-03-16 MED ORDER — HEPARIN BOLUS VIA INFUSION
4000.0000 [IU] | Freq: Once | INTRAVENOUS | Status: AC
  Administered 2018-03-16: 4000 [IU] via INTRAVENOUS
  Filled 2018-03-16: qty 4000

## 2018-03-16 MED ORDER — METOPROLOL SUCCINATE ER 25 MG PO TB24
25.0000 mg | ORAL_TABLET | Freq: Every day | ORAL | Status: DC
  Administered 2018-03-16: 25 mg via ORAL
  Filled 2018-03-16: qty 1

## 2018-03-16 MED ORDER — HEPARIN (PORCINE) IN NACL 100-0.45 UNIT/ML-% IJ SOLN
1300.0000 [IU]/h | INTRAMUSCULAR | Status: DC
  Administered 2018-03-16: 1300 [IU]/h via INTRAVENOUS
  Filled 2018-03-16 (×2): qty 250

## 2018-03-16 MED ORDER — POTASSIUM CHLORIDE CRYS ER 10 MEQ PO TBCR
10.0000 meq | EXTENDED_RELEASE_TABLET | Freq: Every day | ORAL | Status: DC
  Administered 2018-03-17 – 2018-03-19 (×3): 10 meq via ORAL
  Filled 2018-03-16 (×3): qty 1

## 2018-03-16 MED ORDER — ROSUVASTATIN CALCIUM 10 MG PO TABS
10.0000 mg | ORAL_TABLET | ORAL | Status: DC
  Administered 2018-03-17: 10 mg via ORAL
  Filled 2018-03-16: qty 1

## 2018-03-16 MED ORDER — FUROSEMIDE 40 MG PO TABS
40.0000 mg | ORAL_TABLET | Freq: Every day | ORAL | Status: DC
  Administered 2018-03-17 – 2018-03-19 (×3): 40 mg via ORAL
  Filled 2018-03-16 (×3): qty 1

## 2018-03-16 MED ORDER — VITAMIN D 1000 UNITS PO TABS
1000.0000 [IU] | ORAL_TABLET | Freq: Every day | ORAL | Status: DC
  Administered 2018-03-17 – 2018-03-19 (×3): 1000 [IU] via ORAL
  Filled 2018-03-16 (×4): qty 1

## 2018-03-16 MED ORDER — ONDANSETRON HCL 4 MG/2ML IJ SOLN: 4.0000 mg | Freq: Four times a day (QID) | INTRAMUSCULAR | Status: DC | PRN

## 2018-03-16 MED ORDER — NITROGLYCERIN 0.4 MG SL SUBL: 0.4000 mg | SUBLINGUAL_TABLET | SUBLINGUAL | Status: DC | PRN

## 2018-03-16 NOTE — ED Triage Notes (Signed)
Patient arrived by [facility] for left anterior CP since am. EMS stated PIV and patient refused NTG. Took 650 of her ASA prior to EMS arrival. Patient reports that pain has almost resolved and no other associated symptoms. Appears slightly anxious. HX of stent and MI

## 2018-03-16 NOTE — Progress Notes (Signed)
ANTICOAGULATION CONSULT NOTE - Initial Consult  Pharmacy Consult for Heparin Indication: chest pain/ACS  Allergies  Allergen Reactions  . Other Other (See Comments)    Gas bubble C3F8 has green bracelet **  . Atorvastatin Other (See Comments)    myalgias  . Codeine Nausea Only  . Kenalog [Triamcinolone Acetonide] Other (See Comments)    Had A.fib after she received the injection."Steroid meds"  . Relafen [Nabumetone] Other (See Comments)    Edema   . Penicillins Rash    40 years ago, injection site was red and swollen.   Has patient had a PCN reaction causing immediate rash, facial/tongue/throat swelling, SOB or lightheadedness with hypotension: YES Has patient had a PCN reaction causing severe rash involving mucus membranes or skin necrosis: NO Has patient had a PCN reaction that required hospitalization. No  Has patient had a PCN reaction occurring within the last 10 years: NO If all of the above answers are "NO", then may proceed with Cephalosporin use.    . Tramadol Other (See Comments)    Auditory halllucinations    Patient Measurements: Weight 124.7 kg Height 172.7 cm Heparin Dosing Weight: 93.4 kg  Vital Signs: Temp: 98.4 F (36.9 C) (04/14 0951) Temp Source: Oral (04/14 0951) BP: 165/81 (04/14 0951) Pulse Rate: 52 (04/14 0951)  Labs: Recent Labs    03/16/18 1025  HGB 15.1*  HCT 45.6  PLT 204  CREATININE 0.89    CrCl cannot be calculated (Unknown ideal weight.).   Medical History: Past Medical History:  Diagnosis Date  . Arthritis    osteoarthritis-hips,knees, back, hands  . Asymmetric septal hypertrophy (HCC)   . Atrial fibrillation (HCC)   . CAD (coronary artery disease)    2 drug-eluting stents placed in the circumflex leading into a marginal.  May 2017.  [name]  . Cataract    corrective surgery done  . Fatty liver   . Gallstones   . GERD (gastroesophageal reflux disease)   . Heart palpitations   . Hepatitis    hepatitis A; past  hx.,many yrs ago ? food source  . Hernia, hiatal   . Hyperlipidemia   . Hypertension   . Obesity   . Sleep apnea    CPAP    Assessment: 77 year old female with history of CAD and stenting now in ED with chest pain to start IV Heparin. Patient has a history of atrial fibrillation but was not on anticoagulation, only Aspirin.   Hgb 15.1, Platelets 204, Troponin 0.11 SCr 0.89/est CrCl ~ 77 mL/min  Goal of Therapy:  Heparin level 0.3-0.7 units/ml Monitor platelets by anticoagulation protocol: Yes   Plan:  Heparin 4000 units x1  Heparin drip at 1300 units/hr.  Heparin level in 8 hours.  Daily heparin level and CBC while on therapy.   Link Snuffer, PharmD, BCPS, BCCCP Clinical Pharmacist Clinical phone 03/16/2018 until 3:[phone] After hours, please call [phone] 03/16/2018,3:13 PM

## 2018-03-16 NOTE — ED Notes (Signed)
Attempted to call report at this time 

## 2018-03-16 NOTE — H&P (Addendum)
Cardiology Admission History and Physical:   Patient ID: Victoria Delgado; MRN: [id number]; DOB: 1941-04-11   Admission date: 03/16/2018  Primary Care Provider: Kendrick Ranch, MD Primary Cardiologist: Rollene Rotunda, MD  Primary Electrophysiologist:  N/A  Chief Complaint:  Chest pain  Patient Profile:   Victoria Delgado is a 77 y.o. female with a history of PAF, CAD s/p PCI to LCx/OM1 2017, GERD, HTN, HLD, OSA, asymmetric septal hypertrophy without obstruction and mild AS who presented with chest pain  History of Present Illness:   Victoria Delgado is very pleasant 77 year old female with PAF, CAD s/p PCI to LCx/OM1 2017, GERD, HTN, HLD, OSA on CPAP, morbid obesity, asymmetric septal hypertrophy without obstruction and moderate AS.  Last cardiac catheterization was in 2017 at [facility].  Interestingly, her symptom at the time was left arm pain.  Last echocardiogram obtained on 10/30/2017 showed EF 60-65%, moderate LVH, grade 1 DD, moderate aortic stenosis, moderately calcified mitral valve without significant regurgitation.  She was last seen by Dr. Antoine Poche on 10/15/2017, she was doing well at the time.  She has not been feeling very well for the past week, this morning she woke up 7:45AM with substernal chest pain.  She denies any exacerbating factors such as deep inspiration, body rotational palpation.  The episode lasted about 4 hours and eventually went away around noon.  She never had this type of chest pain before.  Initial EKG was normal without noticeable ischemic changes.  Point-of-care troponin was 0.07, however subsequent point-of-care troponin went up to 0.11.  Cardiology has been consulted for potential NSTEMI.  Chest x-ray was negative for acute process.   Past Medical History:  Diagnosis Date  . Arthritis    osteoarthritis-hips,knees, back, hands  . Asymmetric septal hypertrophy (HCC)   . Atrial fibrillation (HCC)   . CAD (coronary artery disease)    2 drug-eluting  stents placed in the circumflex leading into a marginal.  May 2017.  [name]  . Cataract    corrective surgery done  . Fatty liver   . Gallstones   . GERD (gastroesophageal reflux disease)   . Heart palpitations   . Hepatitis    hepatitis A; past hx.,many yrs ago ? food source  . Hernia, hiatal   . Hyperlipidemia   . Hypertension   . Obesity   . Sleep apnea    CPAP    Past Surgical History:  Procedure Laterality Date  . APPENDECTOMY  2004  . CATARACT EXTRACTION W/ INTRAOCULAR LENS IMPLANT     both eyes  . CHOLECYSTECTOMY    . NM MYOCAR PERF WALL MOTION  08/15/04   No ischemia  . RETINAL DETACHMENT SURGERY Bilateral    remains with slight hazy vision with lights  . TOTAL HIP ARTHROPLASTY Right 08/04/2014   Procedure: RIGHT TOTAL HIP ARTHROPLASTY ANTERIOR APPROACH;  Surgeon: Loanne Drilling, MD;  Location: [facility];  Service: Orthopedics;  Laterality: Right;  . TOTAL HIP ARTHROPLASTY Left 01/05/2015   Procedure: LEFT TOTAL HIP ARTHROPLASTY ANTERIOR APPROACH;  Surgeon: Loanne Drilling, MD;  Location: [facility];  Service: Orthopedics;  Laterality: Left;  . US ECHOCARDIOGRAPHY  06/25/2012   Moderate ASH,LV hyperdynamic,LA is mod. dilated,trace MR,TR,AI     Medications Prior to Admission: Prior to Admission medications   Medication Sig Start Date End Date Taking? Authorizing Provider  acetaminophen (TYLENOL) 500 MG tablet Take 500 mg by mouth every 6 (six) hours as needed for mild pain.   Yes [provider]  aspirin 325 MG tablet Take 650 mg by mouth once.   Yes [provider]  aspirin EC 81 MG tablet Take 81 mg by mouth daily.   Yes [provider]  Cal Carb-Mag Hydrox-Simeth (ROLAIDS ADVANCED PO) Take 2 tablets by mouth as needed (indigestion).   Yes [provider]  CARTIA XT 240 MG 24 hr capsule TAKE 1 CAPSULE(240 MG) BY MOUTH AT BEDTIME 05/27/17  Yes Hochrein, Fayrene Fearing, MD  Cholecalciferol (VITAMIN D) 2000 units CAPS Take 1 capsule by mouth daily.    Yes [provider]  CINNAMON PO Take 1,000 mg by mouth daily.   Yes [provider]  CRESTOR 10 MG tablet Take 1 tablet (10 mg total) as directed by mouth. Patient taking differently: Take 10 mg by mouth as directed. Taking every other day 10/15/17  Yes Hochrein, Fayrene Fearing, MD  furosemide (LASIX) 40 MG tablet TAKE 1 TABLET BY MOUTH EVERY DAY WITH POTASSIUM PILL 05/27/17  Yes Rollene Rotunda, MD  metoprolol succinate (TOPROL XL) 50 MG 24 hr tablet TAKE 1 TABLET BY MOUTH EVERY MORNING AND 1/2 TABLET EVERY EVENING 12/27/17  Yes Rollene Rotunda, MD  nitroGLYCERIN (NITROSTAT) 0.4 MG SL tablet Place 1 tablet (0.4 mg total) under the tongue every 5 (five) minutes as needed for chest pain. 05/07/16  Yes Rosalio Macadamia, NP  potassium chloride (K-DUR,KLOR-CON) 10 MEQ tablet Take 10 mEq by mouth daily.    Yes [provider]  Propylene Glycol (SYSTANE BALANCE OP) Place 1 drop into both eyes daily as needed (DRY EYES).   Yes [provider]  ranitidine (ZANTAC) 150 MG tablet Take 150 mg by mouth 2 (two) times daily.   Yes [provider]  rosuvastatin (CRESTOR) 10 MG tablet Take 1 tablet (10 mg total) every 3 (three) days by mouth. Patient not taking: Reported on 03/16/2018 10/15/17   Rollene Rotunda, MD  TOPROL XL 50 MG 24 hr tablet TAKE 1 TABLET BY MOUTH EVERY MORNING AND 1/2 TABLET EVERY EVENING Patient not taking: Reported on 03/16/2018 05/28/17   Rollene Rotunda, MD     Allergies:    Allergies  Allergen Reactions  . Other Other (See Comments)    Gas bubble C3F8 has green bracelet **  . Atorvastatin Other (See Comments)    myalgias  . Codeine Nausea Only  . Kenalog [Triamcinolone Acetonide] Other (See Comments)    Had A.fib after she received the injection."Steroid meds"  . Relafen [Nabumetone] Other (See Comments)    Edema   . Penicillins Rash    40 years ago, injection site was red and swollen.   Has patient had a PCN reaction causing immediate rash,  facial/tongue/throat swelling, SOB or lightheadedness with hypotension: YES Has patient had a PCN reaction causing severe rash involving mucus membranes or skin necrosis: NO Has patient had a PCN reaction that required hospitalization. No  Has patient had a PCN reaction occurring within the last 10 years: NO If all of the above answers are "NO", then may proceed with Cephalosporin use.    . Tramadol Other (See Comments)    Auditory halllucinations    Social History:   Social History   Socioeconomic History  . Marital status: Married    Spouse name: Not on file  . Number of children: 2  . Years of education: Not on file  . Highest education level: Not on file  Occupational History    Employer: OTHER  Social Needs  . Financial resource strain: Not on  file  . Food insecurity:    Worry: Not on file    Inability: Not on file  . Transportation needs:    Medical: Not on file    Non-medical: Not on file  Tobacco Use  . Smoking status: Former Games developer  . Smokeless tobacco: Never Used  . Tobacco comment: QUIT IN 1990  Substance and Sexual Activity  . Alcohol use: No    Alcohol/week: 0.0 oz  . Drug use: No  . Sexual activity: Yes  Lifestyle  . Physical activity:    Days per week: Not on file    Minutes per session: Not on file  . Stress: Not on file  Relationships  . Social connections:    Talks on phone: Not on file    Gets together: Not on file    Attends religious service: Not on file    Active member of club or organization: Not on file    Attends meetings of clubs or organizations: Not on file    Relationship status: Not on file  . Intimate partner violence:    Fear of current or ex partner: Not on file    Emotionally abused: Not on file    Physically abused: Not on file    Forced sexual activity: Not on file  Other Topics Concern  . Not on file  Social History Narrative   Two grandchildren.             Family History:   The patient's family history includes  CVA in her mother; Diabetes in her other; Esophageal cancer in her other; Hypertension in her mother; Lung cancer (age of onset: 75) in her father. There is no history of Colon cancer, Pancreatic cancer, Kidney disease, or Liver disease.    ROS:  Please see the history of present illness.  All other ROS reviewed and negative.     Physical Exam/Data:   Vitals:   03/16/18 1300 03/16/18 1330 03/16/18 1400 03/16/18 1500  BP: 133/62 139/65 135/62   Pulse: (!) 49 (!) 50 (!) 50   Resp:      Temp:      TempSrc:      SpO2: 94% 93% 95%   Weight:    275 lb (124.7 kg)   No intake or output data in the 24 hours ending 03/16/18 1624 Filed Weights   03/16/18 1500  Weight: 275 lb (124.7 kg)   Body mass index is 41.81 kg/m.  General:  Well nourished, well developed, in no acute distress HEENT: normal Lymph: no adenopathy Neck: no JVD Endocrine:  No thryomegaly Vascular: No carotid bruits; FA pulses 2+ bilaterally without bruits  Cardiac:  normal S1, S2; RRR; 1/6 murmur  Lungs:  clear to auscultation bilaterally, no wheezing, rhonchi or rales  Abd: soft, nontender, no hepatomegaly  Ext: 1+ edema Musculoskeletal:  No deformities, BUE and BLE strength normal and equal Skin: warm and dry  Neuro:  CNs 2-12 intact, no focal abnormalities noted Psych:  Normal affect    EKG:  The ECG that was done 03/16/2018 was personally reviewed and demonstrates sinus bradycardia without significant ST-T wave changes.  Relevant CV Studies:  Echo 10/30/2017 LV EF: 60% -   65%  Study Conclusions  - Left ventricle: The cavity size was normal. Wall thickness was   increased in a pattern of moderate LVH. Systolic function was   normal. The estimated ejection fraction was in the range of 60%   to 65%. Wall motion was normal; there were  no regional wall   motion abnormalities. Doppler parameters are consistent with   abnormal left ventricular relaxation (grade 1 diastolic   dysfunction). - Aortic valve:  Trileaflet; moderately calcified leaflets. There   was moderate stenosis. Mean gradient (S): 28 mm Hg. Peak gradient   (S): 44 mm Hg. Valve area (VTI): 1.44 cm^2. - Mitral valve: Moderately calcified annulus. There was no   significant regurgitation. - Left atrium: The atrium was moderately dilated. - Right ventricle: Poorly visualized. The cavity size was normal.   Systolic function was normal. - Inferior vena cava: The vessel was normal in size. The   respirophasic diameter changes were in the normal range (>= 50%),   consistent with normal central venous pressure.  Impressions:  - Normal LV size with moderate LV hypertrophy. EF 60-65%. RV poorly   visualized but probably normal. Moderate LAE. Moderate aortic   stenosis.   Laboratory Data:  Chemistry Recent Labs  Lab 03/16/18 1025  NA 142  K 4.3  CL 105  CO2 27  GLUCOSE 155*  BUN 14  CREATININE 0.89  CALCIUM 9.6  GFRNONAA >60  GFRAA >60  ANIONGAP 10    No results for input(s): PROT, ALBUMIN, AST, ALT, ALKPHOS, BILITOT in the last 168 hours. Hematology Recent Labs  Lab 03/16/18 1025  WBC 7.0  RBC 5.00  HGB 15.1*  HCT 45.6  MCV 91.2  MCH 30.2  MCHC 33.1  RDW 13.6  PLT 204   Cardiac EnzymesNo results for input(s): TROPONINI in the last 168 hours.  Recent Labs  Lab 03/16/18 1108 03/16/18 1444  TROPIPOC 0.07 0.11*    BNPNo results for input(s): BNP, PROBNP in the last 168 hours.  DDimer No results for input(s): DDIMER in the last 168 hours.  Radiology/Studies:  Dg Chest 2 View  Result Date: 03/16/2018 CLINICAL DATA:  LEFT anterior chest pain since this morning, history coronary artery disease, hypertension, atrial fibrillation, GERD, former smoker EXAM: CHEST - 2 VIEW COMPARISON:  05/11/2016 FINDINGS: Enlargement of cardiac silhouette. Mediastinal contours and pulmonary vascularity normal. Atherosclerotic calcification aorta. Lungs clear. No pleural effusion or pneumothorax. Bones demineralized.  IMPRESSION: Enlargement of cardiac silhouette without acute infiltrate. Electronically Signed   By: Ulyses Southward M.D.   On: 03/16/2018 11:06    Assessment and Plan:   1. NSTEMI: IV heparin.  Initial troponin was borderline elevated.  Continue to trend serial troponin overnight.    -N.p.o. past midnight. Will hold off on repeat Echo  -Myoview versus cardiac catheterization depending on troponin elevation. Morning team to decide on the plan    2. CAD s/p DES to LCx/OM1 in 2017 3. Lower extremity edema: She has 1-2+ bilateral lower extremity edema, however her lung is clear, no obvious JVD on physical exam.  Given her body habitus, this could be venous stasis.  Recommend leg elevation at night.  Continue on 40 mg daily of Lasix.  No obvious sign of heart failure on physical exam.  4. HTN: Blood pressure elevated on initial arrival, however since then has normalized.  5. HLD: Continue Crestor 10 mg daily.  Fasting lipid panel tomorrow morning.  6. OSA on CPAP: Compliant with CPAP therapy.  7. morbid obesity  8. 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  Low-grade mild holosystolic murmur on physical exam.  9. moderate AS: Moderate aortic stenosis.   Severity of Illness: The appropriate patient status for this patient is OBSERVATION. Observation status is judged to be  reasonable and necessary in order to provide the required intensity of service to ensure the patient's safety. The patient's presenting symptoms, physical exam findings, and initial radiographic and laboratory data in the context of their medical condition is felt to place them at decreased risk for further clinical deterioration. Furthermore, it is anticipated that the patient will be medically stable for discharge from the hospital within 2 midnights of admission. The following factors support the patient status of observation.   " The patient's  presenting symptoms include chest pain. " The physical exam findings include benign. " The initial radiographic and laboratory data are .     For questions or updates, please contact [facility] Please consult [email or web address] for contact info under Cardiology/STEMI.    Ramond Dial, [place]  03/16/2018 4:24 PM    I have seen, examined the patient, and reviewed the above assessment and plan.  Changes to above are made where necessary.  On exam, pleasant, comfortable appearing.  Pt with chest pain of unclear etiology.  Though worrisome for unstable angina, there are also atypical features.  Will observe overnight.  C[place].  If rules in or pain worsens, would proceed with cath.  Otherwise, will plan myoview tomorrow.  Co Sign: Hillis Range, MD 03/16/2018 4:55 PM

## 2018-03-16 NOTE — ED Notes (Signed)
Attempted to call report (second attempt) at this time.  RN stated "I am unable to take report".  Will attempt again.

## 2018-03-16 NOTE — ED Provider Notes (Signed)
[facility] EMERGENCY DEPARTMENT Provider Note   CSN: [id number] Arrival date & time: 03/16/18  0940     History   Chief Complaint Chief Complaint  Patient presents with  . Chest Pain    HPI Victoria Delgado is a 77 y.o. female.  HPI  Elderly female with history of coronary artery disease, stenting, now on aspirin since after an episode of chest pain. Patient notes that over the past week she has had several episodes of generalized unease, but no focal pain until today. Today, without clear precipitant she woke with pain in her sternum.  The pain was nonradiating, sore and resolved without intervention. Currently she is asymptomatic. She is here with her husband who assists with the HPI. Patient notes that her prior MI 2 years ago was diagnosed after she presented with left arm pain, not chest pain. She is a former smoker in the distant past, states that she is generally active, awake and alert, and has been in good health.  Past Medical History:  Diagnosis Date  . Arthritis    osteoarthritis-hips,knees, back, hands  . Asymmetric septal hypertrophy (HCC)   . Atrial fibrillation (HCC)   . CAD (coronary artery disease)    2 drug-eluting stents placed in the circumflex leading into a marginal.  May 2017.  [name]  . Cataract    corrective surgery done  . Fatty liver   . Gallstones   . GERD (gastroesophageal reflux disease)   . Heart palpitations   . Hepatitis    hepatitis A; past hx.,many yrs ago ? food source  . Hernia, hiatal   . Hyperlipidemia   . Hypertension   . Obesity   . Sleep apnea    CPAP    Patient Active Problem List   Diagnosis Date Noted  . Right adrenal mass (HCC) 11/05/2017  . Nonrheumatic aortic valve stenosis 10/11/2017  . Coronary artery disease involving native coronary artery of native heart without angina pectoris 10/11/2017  . LVH (left ventricular hypertrophy) 10/11/2017  . PAF (paroxysmal atrial fibrillation) (HCC)  05/03/2015  . OA (osteoarthritis) of hip 08/04/2014  . Edema 08/17/2013  . H/O bone density study 03/16/2013  . 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 Unspecified hereditary and idiopathic peripheral neuropathy 03/05/2013  . Unspecified vitamin D deficiency 03/05/2013  . Fatty infiltration of liver 02/08/2013  . Mixed hyperlipidemia 02/08/2013  . Morbid obesity (HCC) 02/08/2013  . Cellulitis of groin, right 12/30/2012  . DJD (degenerative joint disease) 12/17/2012  . GERD (gastroesophageal reflux disease) 12/17/2012  . Peripheral neuropathy 12/17/2012  . HOCM (hypertrophic obstructive cardiomyopathy) (HCC) 10/16/2012  . HTN (hypertension) 10/16/2012  . Sleep apnea 10/16/2012  . Heart palpitations     Past Surgical History:  Procedure Laterality Date  . APPENDECTOMY  2004  . CATARACT EXTRACTION W/ INTRAOCULAR LENS IMPLANT     both eyes  . CHOLECYSTECTOMY    . NM MYOCAR PERF WALL MOTION  08/15/04   No ischemia  . RETINAL DETACHMENT SURGERY Bilateral    remains with slight hazy vision with lights  . TOTAL HIP ARTHROPLASTY Right 08/04/2014   Procedure: RIGHT TOTAL HIP ARTHROPLASTY ANTERIOR APPROACH;  Surgeon: Loanne Drilling, MD;  Location: [facility];  Service: Orthopedics;  Laterality: Right;  . TOTAL HIP ARTHROPLASTY Left 01/05/2015   Procedure: LEFT TOTAL HIP ARTHROPLASTY ANTERIOR APPROACH;  Surgeon: Loanne Drilling, MD;  Location: [facility];  Service: Orthopedics;  Laterality: Left;  . US ECHOCARDIOGRAPHY  06/25/2012   Moderate ASH,LV  hyperdynamic,LA is mod. dilated,trace MR,TR,AI     OB History    Gravida  2   Para  2   Term      Preterm      AB      Living        SAB      TAB      Ectopic      Multiple      Live Births               Home Medications    Prior to Admission medications   Medication Sig Start Date End Date Taking? Authorizing Provider  acetaminophen (TYLENOL) 500 MG tablet Take 500 mg by mouth every 6 (six) hours as needed for mild pain.     [provider]  aspirin EC 81 MG tablet Take 81 mg by mouth daily.    [provider]  CARTIA XT 240 MG 24 hr capsule TAKE 1 CAPSULE(240 MG) BY MOUTH AT BEDTIME 05/27/17   Rollene Rotunda, MD  CRESTOR 10 MG tablet Take 1 tablet (10 mg total) as directed by mouth. 10/15/17   Rollene Rotunda, MD  furosemide (LASIX) 40 MG tablet TAKE 1 TABLET BY MOUTH EVERY DAY WITH POTASSIUM PILL 05/27/17   Rollene Rotunda, MD  metoprolol succinate (TOPROL XL) 50 MG 24 hr tablet TAKE 1 TABLET BY MOUTH EVERY MORNING AND 1/2 TABLET EVERY EVENING 12/27/17   Rollene Rotunda, MD  nitroGLYCERIN (NITROSTAT) 0.4 MG SL tablet Place 1 tablet (0.4 mg total) under the tongue every 5 (five) minutes as needed for chest pain. 05/07/16   Rosalio Macadamia, NP  potassium chloride (K-DUR,KLOR-CON) 10 MEQ tablet Take 10 mEq by mouth daily.     [provider]  ranitidine (ZANTAC) 150 MG tablet Take 150 mg by mouth 2 (two) times daily.    [provider]  rosuvastatin (CRESTOR) 10 MG tablet Take 1 tablet (10 mg total) every 3 (three) days by mouth. 10/15/17   Rollene Rotunda, MD  TOPROL XL 50 MG 24 hr tablet TAKE 1 TABLET BY MOUTH EVERY MORNING AND 1/2 TABLET EVERY EVENING 05/28/17   Rollene Rotunda, MD    Family History Family History  Problem Relation Age of Onset  . Hypertension Mother   . CVA Mother   . Lung cancer Father 45       asbestos exposure  . Esophageal cancer Other        nephew  . Diabetes Other        nephew  . Colon cancer Neg Hx   . Pancreatic cancer Neg Hx   . Kidney disease Neg Hx   . Liver disease Neg Hx     Social History Social History   Tobacco Use  . Smoking status: Former Games developer  . Smokeless tobacco: Never Used  . Tobacco comment: QUIT IN 1990  Substance Use Topics  . Alcohol use: No    Alcohol/week: 0.0 oz  . Drug use: No     Allergies   Other; Atorvastatin; Codeine; Kenalog [triamcinolone acetonide]; Relafen [nabumetone]; Penicillins; and  Tramadol   Review of Systems Review of Systems  Constitutional:       Per HPI, otherwise negative  HENT:       Per HPI, otherwise negative  Respiratory:       Per HPI, otherwise negative  Cardiovascular:       Per HPI, otherwise negative  Gastrointestinal: Negative for vomiting.  Endocrine:       Negative  aside from HPI  Genitourinary:       Neg aside from HPI   Musculoskeletal:       Per HPI, otherwise negative  Skin: Negative.   Neurological: Negative for syncope.     Physical Exam Updated Vital Signs BP (!) 165/81   Pulse (!) 52   Temp 98.4 F (36.9 C) (Oral)   Resp 18   SpO2 96%   Physical Exam  Constitutional: She is oriented to person, place, and time. She appears well-developed and well-nourished. No distress.  HENT:  Head: Normocephalic and atraumatic.  Eyes: Conjunctivae and EOM are normal.  Cardiovascular: Normal rate and regular rhythm.  Pulmonary/Chest: Effort normal and breath sounds normal. No stridor. No respiratory distress.  Abdominal: She exhibits no distension.  Musculoskeletal: She exhibits no edema.  Neurological: She is alert and oriented to person, place, and time. No cranial nerve deficit.  Skin: Skin is warm and dry.  Psychiatric: She has a normal mood and affect.  Nursing note and vitals reviewed.    ED Treatments / Results  Labs (all labs ordered are listed, but only abnormal results are displayed) Labs Reviewed  BASIC METABOLIC PANEL - Abnormal; Notable for the following components:      Result Value   Glucose, Bld 155 (*)    All other components within normal limits  CBC - Abnormal; Notable for the following components:   Hemoglobin 15.1 (*)    All other components within normal limits  I-STAT TROPONIN, ED - Abnormal; Notable for the following components:   Troponin i, poc 0.11 (*)    All other components within normal limits  BRAIN NATRIURETIC PEPTIDE  I-STAT TROPONIN, ED      EKG EKG  Interpretation  Date/Time:  Sunday March 16 2018 09:57:37 EDT Ventricular Rate:  53 PR Interval:  208 QRS Duration: 88 QT Interval:  472 QTC Calculation: 442 R Axis:   -63 Text Interpretation:  Sinus bradycardia Left anterior fascicular block Possible Lateral infarct , age undetermined Abnormal ECG Confirmed by Gerhard Munch ([id number]), editor Barbette Hair [phone]) on 03/16/2018 10:48:00 AM   Radiology Dg Chest 2 View  Result Date: 03/16/2018 CLINICAL DATA:  LEFT anterior chest pain since this morning, history coronary artery disease, hypertension, atrial fibrillation, GERD, former smoker EXAM: CHEST - 2 VIEW COMPARISON:  05/11/2016 FINDINGS: Enlargement of cardiac silhouette. Mediastinal contours and pulmonary vascularity normal. Atherosclerotic calcification aorta. Lungs clear. No pleural effusion or pneumothorax. Bones demineralized. IMPRESSION: Enlargement of cardiac silhouette without acute infiltrate. Electronically Signed   By: Ulyses Southward M.D.   On: 03/16/2018 11:06    Procedures Procedures (including critical care time)  Medications Ordered in ED Heparin  Initial Impression / Assessment and Plan / ED Course  I have reviewed the triage vital signs and the nursing notes.  Pertinent labs & imaging results that were available during my care of the patient were reviewed by me and considered in my medical decision making (see chart for details).    3:09 PM On repeat exam the patient remains in similar condition, no return of the pain. However, the patient's second troponin is elevated compared to the prior, and with concern for an ischemic event that occurred earlier today, particularly the patient's history, she will be admitted for initiation of heparin.  This patient had no recurrence of her pain, but given her history of prior Effient and aspirin use, now only using aspirin, and history of multiple prior stenting procedures, given the description of chest pain, some  suspicion  for unstable angina or a brief ischemic episode persists. Patient's findings otherwise notable for no evidence for pneumonia, no asymmetric pulses or neuro findings concerning for dissection.  Final Clinical Impressions(s) / ED Diagnoses  Unstable angina  CRITICAL CARE Performed by: Gerhard Munch Total critical care time: 35 minutes Critical care time was exclusive of separately billable procedures and treating other patients. Critical care was necessary to treat or prevent imminent or life-threatening deterioration. Critical care was time spent personally by me on the following activities: development of treatment plan with patient and/or surrogate as well as nursing, discussions with consultants, evaluation of patient's response to treatment, examination of patient, obtaining history from patient or surrogate, ordering and performing treatments and interventions, ordering and review of laboratory studies, ordering and review of radiographic studies, pulse oximetry and re-evaluation of patient's condition.    Gerhard Munch, MD 03/16/18 [phone]

## 2018-03-17 ENCOUNTER — Observation Stay (HOSPITAL_BASED_OUTPATIENT_CLINIC_OR_DEPARTMENT_OTHER): Payer: Medicare Other

## 2018-03-17 DIAGNOSIS — R072 Precordial pain: Secondary | ICD-10-CM | POA: Diagnosis not present

## 2018-03-17 DIAGNOSIS — I2510 Atherosclerotic heart disease of native coronary artery without angina pectoris: Secondary | ICD-10-CM | POA: Diagnosis not present

## 2018-03-17 DIAGNOSIS — I252 Old myocardial infarction: Secondary | ICD-10-CM | POA: Diagnosis not present

## 2018-03-17 DIAGNOSIS — I10 Essential (primary) hypertension: Secondary | ICD-10-CM | POA: Diagnosis not present

## 2018-03-17 DIAGNOSIS — I200 Unstable angina: Secondary | ICD-10-CM | POA: Diagnosis not present

## 2018-03-17 DIAGNOSIS — R079 Chest pain, unspecified: Secondary | ICD-10-CM | POA: Diagnosis not present

## 2018-03-17 LAB — BASIC METABOLIC PANEL
ANION GAP: 12 (ref 5–15)
BUN: 15 mg/dL (ref 6–20)
CALCIUM: 8.8 mg/dL — AB (ref 8.9–10.3)
CHLORIDE: 103 mmol/L (ref 101–111)
CO2: 25 mmol/L (ref 22–32)
Creatinine, Ser: 0.87 mg/dL (ref 0.44–1.00)
GFR calc Af Amer: >60 mL/min (ref >60)
GFR calc non Af Amer: >60 mL/min (ref >60)
GLUCOSE: 145 mg/dL — AB (ref 65–99)
Potassium: 4.5 mmol/L (ref 3.5–5.1)
Sodium: 140 mmol/L (ref 135–145)

## 2018-03-17 LAB — CBC
HEMATOCRIT: 43.5 % (ref 36.0–46.0)
Hemoglobin: 14.8 g/dL (ref 12.0–15.0)
MCH: 31.2 pg (ref 26.0–34.0)
MCHC: 34 g/dL (ref 30.0–36.0)
MCV: 91.8 fL (ref 78.0–100.0)
Platelets: 210 10*3/uL (ref 150–400)
RBC: 4.74 MIL/uL (ref 3.87–5.11)
RDW: 13.7 % (ref 11.5–15.5)
WBC: 8.1 10*3/uL (ref 4.0–10.5)

## 2018-03-17 LAB — LIPID PANEL
CHOLESTEROL: 173 mg/dL (ref 0–200)
HDL: 34 mg/dL — ABNORMAL LOW (ref >40)
LDL Cholesterol: 102 mg/dL — ABNORMAL HIGH (ref 0–99)
TRIGLYCERIDES: 184 mg/dL — AB (ref <150)
Total CHOL/HDL Ratio: 5.1 RATIO
VLDL: 37 mg/dL (ref 0–40)

## 2018-03-17 LAB — HEPARIN LEVEL (UNFRACTIONATED)
HEPARIN UNFRACTIONATED: 0.55 [IU]/mL (ref 0.30–0.70)
Heparin Unfractionated: 0.41 IU/mL (ref 0.30–0.70)
Heparin Unfractionated: 0.29 IU/mL — ABNORMAL LOW (ref 0.30–0.70)

## 2018-03-17 LAB — TROPONIN I
Troponin I: 0.09 ng/mL (ref <0.03)
Troponin I: 0.05 ng/mL (ref <0.03)

## 2018-03-17 MED ORDER — REGADENOSON 0.4 MG/5ML IV SOLN
INTRAVENOUS | Status: AC
  Filled 2018-03-17: qty 5

## 2018-03-17 MED ORDER — REGADENOSON 0.4 MG/5ML IV SOLN
0.4000 mg | Freq: Once | INTRAVENOUS | Status: AC
  Administered 2018-03-17: 0.4 mg via INTRAVENOUS

## 2018-03-17 MED ORDER — METOPROLOL SUCCINATE ER 50 MG PO TB24
50.0000 mg | ORAL_TABLET | Freq: Every day | ORAL | Status: DC
  Administered 2018-03-18: 50 mg via ORAL
  Filled 2018-03-17 (×2): qty 1

## 2018-03-17 MED ORDER — HEPARIN (PORCINE) IN NACL 100-0.45 UNIT/ML-% IJ SOLN
1500.0000 [IU]/h | INTRAMUSCULAR | Status: DC
  Administered 2018-03-18: 1500 [IU]/h via INTRAVENOUS
  Filled 2018-03-17: qty 250

## 2018-03-17 MED ORDER — METOPROLOL SUCCINATE ER 25 MG PO TB24
25.0000 mg | ORAL_TABLET | Freq: Every day | ORAL | Status: DC
  Administered 2018-03-17: 25 mg via ORAL
  Filled 2018-03-17: qty 1

## 2018-03-17 MED ORDER — HEPARIN BOLUS VIA INFUSION
1000.0000 [IU] | Freq: Once | INTRAVENOUS | Status: AC
  Administered 2018-03-17: 1000 [IU] via INTRAVENOUS
  Filled 2018-03-17: qty 1000

## 2018-03-17 NOTE — Progress Notes (Signed)
ANTICOAGULATION CONSULT NOTE Pharmacy Consult for Heparin Indication: chest pain/ACS  Allergies  Allergen Reactions  . Other Other (See Comments)    Gas bubble C3F8 has green bracelet **  . Atorvastatin Other (See Comments)    myalgias  . Codeine Nausea Only  . Kenalog [Triamcinolone Acetonide] Other (See Comments)    Had A.fib after she received the injection."Steroid meds"  . Relafen [Nabumetone] Other (See Comments)    Edema   . Penicillins Rash    40 years ago, injection site was red and swollen.   Has patient had a PCN reaction causing immediate rash, facial/tongue/throat swelling, SOB or lightheadedness with hypotension: YES Has patient had a PCN reaction causing severe rash involving mucus membranes or skin necrosis: NO Has patient had a PCN reaction that required hospitalization. No  Has patient had a PCN reaction occurring within the last 10 years: NO If all of the above answers are "NO", then may proceed with Cephalosporin use.    . Tramadol Other (See Comments)    Auditory halllucinations    Patient Measurements: Weight 124.7 kg Height 172.7 cm Heparin Dosing Weight: 93.4 kg  Vital Signs: Temp: 98.2 F (36.8 C) (04/14 2115) Temp Source: Oral (04/14 2115) BP: 132/66 (04/14 2115) Pulse Rate: 57 (04/14 2115)  Labs: Recent Labs    03/16/18 1025 03/16/18 1950 03/17/18 0034  HGB 15.1*  --  14.8  HCT 45.6  --  43.5  PLT 204  --  210  HEPARINUNFRC  --   --  0.41  CREATININE 0.89  --   --   TROPONINI  --  0.10*  --     Estimated Creatinine Clearance: 79.5 mL/min (by C-G formula based on SCr of 0.89 mg/dL).  Assessment: 77 y.o. female with chest pain for heparin   Goal of Therapy:  Heparin level 0.3-0.7 units/ml Monitor platelets by anticoagulation protocol: Yes   Plan:  Continue Heparin at current rate   Geannie Risen, PharmD, BCPS  03/17/2018,1:11 AM

## 2018-03-17 NOTE — Progress Notes (Signed)
ANTICOAGULATION CONSULT NOTE -follow up Pharmacy Consult for Heparin  Indication: chest pain/ACS  Allergies  Allergen Reactions  . Other Other (See Comments)    Gas bubble C3F8 has green bracelet **  . Atorvastatin Other (See Comments)    myalgias  . Codeine Nausea Only  . Kenalog [Triamcinolone Acetonide] Other (See Comments)    Had A.fib after she received the injection."Steroid meds"  . Relafen [Nabumetone] Other (See Comments)    Edema   . Penicillins Rash    40 years ago, injection site was red and swollen.   Has patient had a PCN reaction causing immediate rash, facial/tongue/throat swelling, SOB or lightheadedness with hypotension: YES Has patient had a PCN reaction causing severe rash involving mucus membranes or skin necrosis: NO Has patient had a PCN reaction that required hospitalization. No  Has patient had a PCN reaction occurring within the last 10 years: NO If all of the above answers are "NO", then may proceed with Cephalosporin use.    . Tramadol Other (See Comments)    Auditory halllucinations    Patient Measurements: Height: 5\' 8"  (172.7 cm) Weight: 289 lb (131.1 kg) IBW/kg (Calculated) : 63.9 Heparin Dosing Weight: 95.2 kg  Vital Signs: Temp: 98.1 F (36.7 C) (04/15 0541) Temp Source: Oral (04/15 0541) BP: 145/74 (04/15 1051) Pulse Rate: 72 (04/15 1052)  Labs: Recent Labs    03/16/18 1025 03/16/18 1950 03/17/18 0034 03/17/18 0746 03/17/18 1006  HGB 15.1*  --  14.8  --   --   HCT 45.6  --  43.5  --   --   PLT 204  --  210  --   --   HEPARINUNFRC  --   --  0.41  --  0.29*  CREATININE 0.89  --   --  0.87  --   TROPONINI  --  0.10* 0.09* 0.05*  --     Estimated Creatinine Clearance: 81.3 mL/min (by C-G formula based on SCr of 0.87 mg/dL).   Medical History: Past Medical History:  Diagnosis Date  . Arthritis    osteoarthritis-hips,knees, back, hands  . Asymmetric septal hypertrophy (HCC)   . Atrial fibrillation (HCC)   . CAD (coronary  artery disease)    2 drug-eluting stents placed in the circumflex leading into a marginal.  May 2017.  [name]  . Cataract    corrective surgery done  . Fatty liver   . Gallstones   . GERD (gastroesophageal reflux disease)   . Heart palpitations   . Hepatitis    hepatitis A; past hx.,many yrs ago ? food source  . Hernia, hiatal   . Hyperlipidemia   . Hypertension   . Obesity   . Sleep apnea    CPAP    Medications:  Medications Prior to Admission  Medication Sig Dispense Refill Last Dose  . acetaminophen (TYLENOL) 500 MG tablet Take 500 mg by mouth every 6 (six) hours as needed for mild pain.   unk at prn  . aspirin EC 81 MG tablet Take 81 mg by mouth daily.   03/15/2018 at Unknown time  . Cal Carb-Mag Hydrox-Simeth (ROLAIDS ADVANCED PO) Take 2 tablets by mouth as needed (indigestion).   03/15/2018 at Unknown time  . CARTIA XT 240 MG 24 hr capsule TAKE 1 CAPSULE(240 MG) BY MOUTH AT BEDTIME 90 capsule 3 03/15/2018 at Unknown time  . Cholecalciferol (VITAMIN D) 2000 units CAPS Take 1 capsule by mouth daily.   03/16/2018 at Unknown time  . CINNAMON PO  Take 1,000 mg by mouth daily.   03/15/2018 at Unknown time  . CRESTOR 10 MG tablet Take 1 tablet (10 mg total) as directed by mouth. (Patient taking differently: Take 10 mg by mouth as directed. Taking every other day) 90 tablet 3 03/15/2018 at Unknown time  . furosemide (LASIX) 40 MG tablet TAKE 1 TABLET BY MOUTH EVERY DAY WITH POTASSIUM PILL 90 tablet 3 03/16/2018 at Unknown time  . metoprolol succinate (TOPROL XL) 50 MG 24 hr tablet TAKE 1 TABLET BY MOUTH EVERY MORNING AND 1/2 TABLET EVERY EVENING 135 tablet 2 03/16/2018 at 0815  . nitroGLYCERIN (NITROSTAT) 0.4 MG SL tablet Place 1 tablet (0.4 mg total) under the tongue every 5 (five) minutes as needed for chest pain. 25 tablet 3 unk at prn  . potassium chloride (K-DUR,KLOR-CON) 10 MEQ tablet Take 10 mEq by mouth daily.    03/16/2018 at Unknown time  . Propylene Glycol (SYSTANE BALANCE OP)  Place 1 drop into both eyes daily as needed (DRY EYES).   03/15/2018 at Unknown time  . ranitidine (ZANTAC) 150 MG tablet Take 150 mg by mouth 2 (two) times daily.   03/15/2018 at Unknown time   Scheduled:  . aspirin EC  81 mg Oral Daily  . cholecalciferol  1,000 Units Oral Daily  . diltiazem  240 mg Oral QHS  . furosemide  40 mg Oral Daily  . metoprolol succinate  25 mg Oral QHS  . metoprolol succinate  50 mg Oral Daily  . potassium chloride  10 mEq Oral Daily  . regadenoson      . rosuvastatin  10 mg Oral QODAY    Assessment:  77 y.o. female with chest pain for heparin. No recurrent CP per cardiologist.  Stress imaging today to r/o coronary ischemia.  Continues on IV heparin infusion at 1300 units/hr.   Heparin level this AM = 0.29,  Decreased to below goal 0.3-0.7 units/ml. No interruptions noted in heparin infusion per RN's report. No bleeding noted.  CBC wnl.   Goal of Therapy:  Heparin level 0.3-0.7 units/ml Monitor platelets by anticoagulation protocol: Yes   Plan:  Give 1000 units IV x1 bolus Increase Heparin rate to 1500 units/hr Heparin level in 8 hours Daily Heparin level and CBC  Noah Delaine, RPh Clinical Pharmacist Pager: [phone] or [phone] (330p-1030p) Main Rx [phone] 03/17/2018,11:48 AM

## 2018-03-17 NOTE — Progress Notes (Signed)
Patient presented for Lexiscan. Tolerated procedure well. Day 1 of 2 days study.  Stress imaging done today. Resting tomorrow. Pending result. [facility] to read.

## 2018-03-17 NOTE — Progress Notes (Addendum)
Progress Note  Patient Name: Victoria Delgado Date of Encounter: 03/17/2018  Primary Cardiologist: Rollene Rotunda, MD   Subjective   Doing well this morning. No recurrent CP. Last episode of pain was yesterday around 12 noon. Pt reports recent pain was different from angina experienced in 2017, which was just left arm pain. Her CP yesterday was not worsened by exertion.   Inpatient Medications    Scheduled Meds: . aspirin EC  81 mg Oral Daily  . cholecalciferol  1,000 Units Oral Daily  . diltiazem  240 mg Oral QHS  . furosemide  40 mg Oral Daily  . metoprolol succinate  25 mg Oral QHS  . metoprolol succinate  50 mg Oral Daily  . potassium chloride  10 mEq Oral Daily  . rosuvastatin  10 mg Oral QODAY   Continuous Infusions: . heparin 1,300 Units/hr (03/16/18 1633)   PRN Meds: acetaminophen, nitroGLYCERIN, ondansetron (ZOFRAN) IV   Vital Signs    Vitals:   03/16/18 2108 03/16/18 2115 03/17/18 0541 03/17/18 0759  BP:  132/66 131/62 118/73  Pulse:  (!) 57 (!) 55   Resp:    18  Temp:  98.2 F (36.8 C) 98.1 F (36.7 C)   TempSrc:  Oral Oral   SpO2:  95% 94%   Weight: 289 lb (131.1 kg)     Height: 5\' 8"  (1.727 m)       Intake/Output Summary (Last 24 hours) at 03/17/2018 0809 Last data filed at 03/17/2018 0315 Gross per 24 hour  Intake 379.1 ml  Output 800 ml  Net -420.9 ml   Filed Weights   03/16/18 1500 03/16/18 2108  Weight: 275 lb (124.7 kg) 289 lb (131.1 kg)    Telemetry    NSR with frequent PVCs - Personally Reviewed  ECG    Sinus brady w/ PVCs - Personally Reviewed  Physical Exam   GEN: No acute distress. Obese    Neck: No JVD Cardiac: regular rhythm w/ PVCs, regular rate, 2/6 SM at RUSB  Respiratory: Clear to auscultation bilaterally. GI: Soft, nontender, non-distended  MS: No edema; No deformity. Neuro:  Nonfocal  Psych: Normal affect   Labs    Chemistry Recent Labs  Lab 03/16/18 1025  NA 142  K 4.3  CL 105  CO2 27  GLUCOSE 155*    BUN 14  CREATININE 0.89  CALCIUM 9.6  GFRNONAA >60  GFRAA >60  ANIONGAP 10     Hematology Recent Labs  Lab 03/16/18 1025 03/17/18 0034  WBC 7.0 8.1  RBC 5.00 4.74  HGB 15.1* 14.8  HCT 45.6 43.5  MCV 91.2 91.8  MCH 30.2 31.2  MCHC 33.1 34.0  RDW 13.6 13.7  PLT 204 210    Cardiac Enzymes Recent Labs  Lab 03/16/18 1950 03/17/18 0034  TROPONINI 0.10* 0.09*    Recent Labs  Lab 03/16/18 1108 03/16/18 1444  TROPIPOC 0.07 0.11*     BNPNo results for input(s): BNP, PROBNP in the last 168 hours.   DDimer No results for input(s): DDIMER in the last 168 hours.   Radiology    Dg Chest 2 View  Result Date: 03/16/2018 CLINICAL DATA:  LEFT anterior chest pain since this morning, history coronary artery disease, hypertension, atrial fibrillation, GERD, former smoker EXAM: CHEST - 2 VIEW COMPARISON:  05/11/2016 FINDINGS: Enlargement of cardiac silhouette. Mediastinal contours and pulmonary vascularity normal. Atherosclerotic calcification aorta. Lungs clear. No pleural effusion or pneumothorax. Bones demineralized. IMPRESSION: Enlargement of cardiac silhouette without acute infiltrate. Electronically  Signed   By: Ulyses Southward M.D.   On: 03/16/2018 11:06    Cardiac Studies   Echo 10/30/2017 LV EF: 60% - 65%  Study Conclusions  - Left ventricle: The cavity size was normal. Wall thickness was increased in a pattern of moderate LVH. Systolic function was normal. The estimated ejection fraction was in the range of 60% to 65%. Wall motion was normal; there were no regional wall motion abnormalities. Doppler parameters are consistent with abnormal left ventricular relaxation (grade 1 diastolic dysfunction). - Aortic valve: Trileaflet; moderately calcified leaflets. There was moderate stenosis. Mean gradient (S): 28 mm Hg. Peak gradient (S): 44 mm Hg. Valve area (VTI): 1.44 cm^2. - Mitral valve: Moderately calcified annulus. There was no significant  regurgitation. - Left atrium: The atrium was moderately dilated. - Right ventricle: Poorly visualized. The cavity size was normal. Systolic function was normal. - Inferior vena cava: The vessel was normal in size. The respirophasic diameter changes were in the normal range (>= 50%), consistent with normal central venous pressure.  Impressions:  - Normal LV size with moderate LV hypertrophy. EF 60-65%. RV poorly visualized but probably normal. Moderate LAE. Moderate aortic stenosis.   Patient Profile     77 y.o. female with a history of PAF, CAD s/p PCI to LCx/OM1 2017, GERD, HTN, HLD, OSA, asymmetric septal hypertrophy without obstruction and mild AS who was admitted for chest pain.  Assessment & Plan    1. Chest Pain: currently pain free. Pain resolved spontaneously w/o intervention, per pt report. No recurrence since 12 noon yesterday. Pain different from previous angina which was left arm pain w/o CP. Cardiac enzymes mildly elevated but with flat trend. Recommend NST for further evaluation to r/o coronary ischemia.   2. CAD: s/p PCI to the LCx/OM1 in 2017. Had left arm pain at that time. Admitted for CP w/o any radiation to the left arm. Pain resolved w/o intervention. Mildly elevated troponin with flat trend, 0.09>>0.11>>0.10. EKG nonischemic. Stress test today to r/o ischemia. Continue medical therapy for now. ASA, metoprolol and Crestor.   3. PVCs: continue metoprolol. K WNL on admit. Monitor on telemetry.   4. Aortic Stenosis: moderate by echo in Nov 2018. Mean gradient was 28 mm Hg at that time. EF normal at 60-65%.   5. HTN: BP is controlled this morning, 118/73. Continue metoprolol and Cardizem.   For questions or updates, please contact [facility] Please consult [email or web address] for contact info under Cardiology/STEMI.  History and all data above reviewed.  Patient examined.  I agree with the findings as above.  She has no further chest pain.  No SOB.   The  patient exam reveals COR:RRR  ,  Lungs: Clear  ,  Abd: Positive bowel sounds, no rebound no guarding, Ext No edema  .  All available labs, radiology testing, previous records reviewed. Agree with documented assessment and plan. Chest pain:  Atypical and typical features but not like the previous angina. The troponin is elevated but not a diagnostic trend.  Lexiscan Myoview today.     Fayrene Fearing Halo Shevlin  9:14 AM  03/17/2018

## 2018-03-17 NOTE — Progress Notes (Signed)
ANTICOAGULATION CONSULT NOTE -follow up Pharmacy Consult for Heparin  Indication: chest pain/ACS  Allergies  Allergen Reactions  . Other Other (See Comments)    Gas bubble [id number] has green bracelet **  . Atorvastatin Other (See Comments)    myalgias  . Codeine Nausea Only  . Kenalog [Triamcinolone Acetonide] Other (See Comments)    Had A.fib after she received the injection."Steroid meds"  . Relafen [Nabumetone] Other (See Comments)    Edema   . Penicillins Rash    40 years ago, injection site was red and swollen.   Has patient had a PCN reaction causing immediate rash, facial/tongue/throat swelling, SOB or lightheadedness with hypotension: YES Has patient had a PCN reaction causing severe rash involving mucus membranes or skin necrosis: NO Has patient had a PCN reaction that required hospitalization. No  Has patient had a PCN reaction occurring within the last 10 years: NO If all of the above answers are "NO", then may proceed with Cephalosporin use.    . Tramadol Other (See Comments)    Auditory halllucinations    Patient Measurements: Height: 5\' 8"  (172.7 cm) Weight: 289 lb (131.1 kg) IBW/kg (Calculated) : 63.9 Heparin Dosing Weight: 95.2 kg  Vital Signs: Temp: 98.4 F (36.9 C) (04/15 2056) Temp Source: Oral (04/15 2056) BP: 125/58 (04/15 2056) Pulse Rate: 57 (04/15 2056)  Labs: Recent Labs    03/16/18 1025 03/16/18 1950 03/17/18 0034 03/17/18 0746 03/17/18 1006 03/17/18 2023  HGB 15.1*  --  14.8  --   --   --   HCT 45.6  --  43.5  --   --   --   PLT 204  --  210  --   --   --   HEPARINUNFRC  --   --  0.41  --  0.29* 0.55  CREATININE 0.89  --   --  0.87  --   --   TROPONINI  --  0.10* 0.09* 0.05*  --   --     Estimated Creatinine Clearance: 81.3 mL/min (by C-G formula based on SCr of 0.87 mg/dL).   Medical History: Past Medical History:  Diagnosis Date  . Arthritis    osteoarthritis-hips,knees, back, hands  . Asymmetric septal hypertrophy (HCC)   .  Atrial fibrillation (HCC)   . CAD (coronary artery disease)    2 drug-eluting stents placed in the circumflex leading into a marginal.  May 2017.  [name]  . Cataract    corrective surgery done  . Fatty liver   . Gallstones   . GERD (gastroesophageal reflux disease)   . Heart palpitations   . Hepatitis    hepatitis A; past hx.,many yrs ago ? food source  . Hernia, hiatal   . Hyperlipidemia   . Hypertension   . Obesity   . Sleep apnea    CPAP    Medications:  Medications Prior to Admission  Medication Sig Dispense Refill Last Dose  . acetaminophen (TYLENOL) 500 MG tablet Take 500 mg by mouth every 6 (six) hours as needed for mild pain.   unk at prn  . aspirin EC 81 MG tablet Take 81 mg by mouth daily.   03/15/2018 at Unknown time  . Cal Carb-Mag Hydrox-Simeth (ROLAIDS ADVANCED PO) Take 2 tablets by mouth as needed (indigestion).   03/15/2018 at Unknown time  . CARTIA XT 240 MG 24 hr capsule TAKE 1 CAPSULE(240 MG) BY MOUTH AT BEDTIME 90 capsule 3 03/15/2018 at Unknown time  . Cholecalciferol (VITAMIN D) 2000  units CAPS Take 1 capsule by mouth daily.   03/16/2018 at Unknown time  . CINNAMON PO Take 1,000 mg by mouth daily.   03/15/2018 at Unknown time  . CRESTOR 10 MG tablet Take 1 tablet (10 mg total) as directed by mouth. (Patient taking differently: Take 10 mg by mouth as directed. Taking every other day) 90 tablet 3 03/15/2018 at Unknown time  . furosemide (LASIX) 40 MG tablet TAKE 1 TABLET BY MOUTH EVERY DAY WITH POTASSIUM PILL 90 tablet 3 03/16/2018 at Unknown time  . metoprolol succinate (TOPROL XL) 50 MG 24 hr tablet TAKE 1 TABLET BY MOUTH EVERY MORNING AND 1/2 TABLET EVERY EVENING 135 tablet 2 03/16/2018 at 0815  . nitroGLYCERIN (NITROSTAT) 0.4 MG SL tablet Place 1 tablet (0.4 mg total) under the tongue every 5 (five) minutes as needed for chest pain. 25 tablet 3 unk at prn  . potassium chloride (K-DUR,KLOR-CON) 10 MEQ tablet Take 10 mEq by mouth daily.    03/16/2018 at Unknown time   . Propylene Glycol (SYSTANE BALANCE OP) Place 1 drop into both eyes daily as needed (DRY EYES).   03/15/2018 at Unknown time  . ranitidine (ZANTAC) 150 MG tablet Take 150 mg by mouth 2 (two) times daily.   03/15/2018 at Unknown time   Scheduled:  . aspirin EC  81 mg Oral Daily  . cholecalciferol  1,000 Units Oral Daily  . diltiazem  240 mg Oral QHS  . furosemide  40 mg Oral Daily  . metoprolol succinate  25 mg Oral QHS  . metoprolol succinate  50 mg Oral Daily  . potassium chloride  10 mEq Oral Daily  . regadenoson      . rosuvastatin  10 mg Oral QODAY    Assessment:  77 y.o. female with chest pain for heparin. No recurrent CP per cardiologist.  Stress imaging today to r/o coronary ischemia.  Continues on IV heparin infusion at 1300 units/hr.   Heparin level this AM = 0.29,  Decreased to below goal 0.3-0.7 units/ml. No interruptions noted in heparin infusion per RN's report. No bleeding noted.  CBC wnl.   Heparin level now therapeutic at 0.55.  Goal of Therapy:  Heparin level 0.3-0.7 units/ml Monitor platelets by anticoagulation protocol: Yes   Plan:  Continue heparin gtt at 1,500 units/hr Monitor daily heparin level, CBC, s/s of bleed  Enzo Bi, PharmD, [facility] Clinical Pharmacist Pager [phone] 03/17/2018 9:45 PM

## 2018-03-18 ENCOUNTER — Observation Stay (HOSPITAL_COMMUNITY): Payer: Medicare Other

## 2018-03-18 DIAGNOSIS — I252 Old myocardial infarction: Secondary | ICD-10-CM | POA: Diagnosis not present

## 2018-03-18 DIAGNOSIS — R072 Precordial pain: Secondary | ICD-10-CM | POA: Diagnosis not present

## 2018-03-18 DIAGNOSIS — I2510 Atherosclerotic heart disease of native coronary artery without angina pectoris: Secondary | ICD-10-CM | POA: Diagnosis not present

## 2018-03-18 DIAGNOSIS — I200 Unstable angina: Secondary | ICD-10-CM | POA: Diagnosis not present

## 2018-03-18 DIAGNOSIS — I10 Essential (primary) hypertension: Secondary | ICD-10-CM | POA: Diagnosis not present

## 2018-03-18 LAB — NM MYOCAR MULTI W/SPECT W/WALL MOTION / EF
CHL CUP RESTING HR STRESS: 60 {beats}/min
CSEPED: 5 min
CSEPEDS: 17 s
CSEPEW: 1 METS
Peak HR: 86 {beats}/min

## 2018-03-18 LAB — CBC
HCT: 43.8 % (ref 36.0–46.0)
Hemoglobin: 14.3 g/dL (ref 12.0–15.0)
MCH: 30 pg (ref 26.0–34.0)
MCHC: 32.6 g/dL (ref 30.0–36.0)
MCV: 92 fL (ref 78.0–100.0)
Platelets: 203 K/uL (ref 150–400)
RBC: 4.76 MIL/uL (ref 3.87–5.11)
RDW: 13.7 % (ref 11.5–15.5)
WBC: 8.8 K/uL (ref 4.0–10.5)

## 2018-03-18 LAB — HEPARIN LEVEL (UNFRACTIONATED): Heparin Unfractionated: 0.6 [IU]/mL (ref 0.30–0.70)

## 2018-03-18 MED ORDER — SODIUM CHLORIDE 0.9 % WEIGHT BASED INFUSION
1.0000 mL/kg/h | INTRAVENOUS | Status: DC
  Administered 2018-03-19: 1 mL/kg/h via INTRAVENOUS

## 2018-03-18 MED ORDER — SODIUM CHLORIDE 0.9 % WEIGHT BASED INFUSION
3.0000 mL/kg/h | INTRAVENOUS | Status: DC
  Administered 2018-03-19: 3 mL/kg/h via INTRAVENOUS

## 2018-03-18 MED ORDER — SODIUM CHLORIDE 0.9 % IV SOLN: 250.0000 mL | INTRAVENOUS | Status: DC | PRN

## 2018-03-18 MED ORDER — METOPROLOL SUCCINATE ER 25 MG PO TB24
25.0000 mg | ORAL_TABLET | Freq: Every day | ORAL | Status: DC
  Administered 2018-03-18: 25 mg via ORAL
  Filled 2018-03-18: qty 1

## 2018-03-18 MED ORDER — ASPIRIN 81 MG PO CHEW
81.0000 mg | CHEWABLE_TABLET | ORAL | Status: AC
  Administered 2018-03-19: 81 mg via ORAL
  Filled 2018-03-18: qty 1

## 2018-03-18 MED ORDER — ASPIRIN EC 81 MG PO TBEC: 81.0000 mg | DELAYED_RELEASE_TABLET | Freq: Every day | ORAL | Status: DC

## 2018-03-18 MED ORDER — SODIUM CHLORIDE 0.9% FLUSH: 3.0000 mL | INTRAVENOUS | Status: DC | PRN

## 2018-03-18 MED ORDER — TECHNETIUM TC 99M TETROFOSMIN IV KIT: 30.0000 | PACK | Freq: Once | INTRAVENOUS | Status: DC | PRN

## 2018-03-18 MED ORDER — SODIUM CHLORIDE 0.9% FLUSH
3.0000 mL | Freq: Two times a day (BID) | INTRAVENOUS | Status: DC
  Administered 2018-03-18: 3 mL via INTRAVENOUS

## 2018-03-18 MED ORDER — METOPROLOL SUCCINATE ER 50 MG PO TB24
50.0000 mg | ORAL_TABLET | Freq: Every day | ORAL | Status: DC
  Administered 2018-03-19: 50 mg via ORAL
  Filled 2018-03-18 (×2): qty 1

## 2018-03-18 NOTE — Progress Notes (Signed)
Pt refused metoprolol this morning since we do not carry brand Toprol XL.  However, she requested this time since she feels her heart fluttering. SR in 70s controled with occasional PVCs.   Hinton Dyer, RN

## 2018-03-18 NOTE — Progress Notes (Addendum)
Progress Note  Patient Name: Victoria Delgado Date of Encounter: 03/18/2018  Primary Cardiologist: Rollene Rotunda, MD   Subjective   Feels well today. No complaints. CP free. No recurrent CP since admission.   Inpatient Medications    Scheduled Meds: . aspirin EC  81 mg Oral Daily  . cholecalciferol  1,000 Units Oral Daily  . diltiazem  240 mg Oral QHS  . furosemide  40 mg Oral Daily  . metoprolol succinate  25 mg Oral QHS  . metoprolol succinate  50 mg Oral Daily  . potassium chloride  10 mEq Oral Daily  . rosuvastatin  10 mg Oral QODAY   Continuous Infusions: . heparin 1,500 Units/hr (03/18/18 0102)   PRN Meds: acetaminophen, nitroGLYCERIN, ondansetron (ZOFRAN) IV   Vital Signs    Vitals:   03/17/18 1052 03/17/18 1944 03/17/18 2056 03/18/18 0431  BP:  (!) 123/47 (!) 125/58 124/64  Pulse: 72 62 (!) 57 (!) 50  Resp:   18 17  Temp:   98.4 F (36.9 C) 97.9 F (36.6 C)  TempSrc:   Oral Oral  SpO2:   95% 96%  Weight:    287 lb 14.4 oz (130.6 kg)  Height:        Intake/Output Summary (Last 24 hours) at 03/18/2018 0858 Last data filed at 03/18/2018 0300 Gross per 24 hour  Intake 461.5 ml  Output -  Net 461.5 ml   Filed Weights   03/16/18 1500 03/16/18 2108 03/18/18 0431  Weight: 275 lb (124.7 kg) 289 lb (131.1 kg) 287 lb 14.4 oz (130.6 kg)    Telemetry    NSR with PVCs - Personally Reviewed  ECG    NSR - Personally Reviewed  Physical Exam   GEN: No acute distress. Obese    Neck: No JVD Cardiac: RRR w/ occasional PVCs, 2/6 murmur at RUSB Respiratory: Clear to auscultation bilaterally. GI: Soft, nontender, non-distended  MS: No edema; No deformity. Neuro:  Nonfocal  Psych: Normal affect   Labs    Chemistry Recent Labs  Lab 03/16/18 1025 03/17/18 0746  NA 142 140  K 4.3 4.5  CL 105 103  CO2 27 25  GLUCOSE 155* 145*  BUN 14 15  CREATININE 0.89 0.87  CALCIUM 9.6 8.8*  GFRNONAA >60 >60  GFRAA >60 >60  ANIONGAP 10 12      Hematology Recent Labs  Lab 03/16/18 1025 03/17/18 0034 03/18/18 0529  WBC 7.0 8.1 8.8  RBC 5.00 4.74 4.76  HGB 15.1* 14.8 14.3  HCT 45.6 43.5 43.8  MCV 91.2 91.8 92.0  MCH 30.2 31.2 30.0  MCHC 33.1 34.0 32.6  RDW 13.6 13.7 13.7  PLT 204 210 203    Cardiac Enzymes Recent Labs  Lab 03/16/18 1950 03/17/18 0034 03/17/18 0746  TROPONINI 0.10* 0.09* 0.05*    Recent Labs  Lab 03/16/18 1108 03/16/18 1444  TROPIPOC 0.07 0.11*     BNPNo results for input(s): BNP, PROBNP in the last 168 hours.   DDimer No results for input(s): DDIMER in the last 168 hours.   Radiology    Dg Chest 2 View  Result Date: 03/16/2018 CLINICAL DATA:  LEFT anterior chest pain since this morning, history coronary artery disease, hypertension, atrial fibrillation, GERD, former smoker EXAM: CHEST - 2 VIEW COMPARISON:  05/11/2016 FINDINGS: Enlargement of cardiac silhouette. Mediastinal contours and pulmonary vascularity normal. Atherosclerotic calcification aorta. Lungs clear. No pleural effusion or pneumothorax. Bones demineralized. IMPRESSION: Enlargement of cardiac silhouette without acute infiltrate. Electronically Signed  By: Ulyses Southward M.D.   On: 03/16/2018 11:06    Cardiac Studies   Echo 10/30/2017 LV EF: 60% - 65%  Study Conclusions  - Left ventricle: The cavity size was normal. Wall thickness was increased in a pattern of moderate LVH. Systolic function was normal. The estimated ejection fraction was in the range of 60% to 65%. Wall motion was normal; there were no regional wall motion abnormalities. Doppler parameters are consistent with abnormal left ventricular relaxation (grade 1 diastolic dysfunction). - Aortic valve: Trileaflet; moderately calcified leaflets. There was moderate stenosis. Mean gradient (S): 28 mm Hg. Peak gradient (S): 44 mm Hg. Valve area (VTI): 1.44 cm^2. - Mitral valve: Moderately calcified annulus. There was no significant  regurgitation. - Left atrium: The atrium was moderately dilated. - Right ventricle: Poorly visualized. The cavity size was normal. Systolic function was normal. - Inferior vena cava: The vessel was normal in size. The respirophasic diameter changes were in the normal range (>= 50%), consistent with normal central venous pressure.  Impressions:  - Normal LV size with moderate LV hypertrophy. EF 60-65%. RV poorly visualized but probably normal. Moderate LAE. Moderate aortic stenosis.  NST 03/18/18 pending  Patient Profile     77 y.o.femalewith a history of PAF, CAD s/p PCI to LCx/OM1 2017, GERD, HTN, HLD, OSA, asymmetric septal hypertrophy without obstruction and mild AS who was admitted for chest pain.   Assessment & Plan    1. Chest Pain: currently pain free. No recurrent pain since admission 2 days ago. Presenting pain was different from previous angina which was left arm pain w/o CP. Cardiac enzymes mildly elevated but with flat trend. NST pending to r/o coronary ischemia.   2. CAD: s/p PCI to the LCx/OM1 in 2017. Had left arm pain at that time. Admitted for CP w/o any radiation to the left arm. Pain resolved w/o intervention. Mildly elevated troponin with flat trend, 0.09>>0.11>>0.10. EKG nonischemic. Finish 2 day stress test today to r/o ischemia. Further w/u to be determined based on stress results. Continue medical therapy for now. ASA, metoprolol and Crestor.   3. PVCs: Occasional PVCs noted. Continue metoprolol. K WNL. Monitor on telemetry.   4. Aortic Stenosis: moderate by echo in Nov 2018. Mean gradient was 28 mm Hg at that time. EF normal at 60-65%.   5. HTN: BP is controlled this morning, 124/64. Continue metoprolol and Cardizem.    For questions or updates, please contact [facility] Please consult [email or web address] for contact info under Cardiology/STEMI.      Signed, Robbie Lis, PA-C  03/18/2018, 8:58 AM    History and all data above  reviewed.  Patient examined.  I agree with the findings as above.  She has had no more chest pain.  No SOB. The patient exam reveals COR:rrr  ,  Lungs: Clear  ,  Abd: Positive bowel sounds, no rebound no guarding, Ext No edema  .  All available labs, radiology testing, previous records reviewed. Agree with documented assessment and plan. Chest pain:  Plan for part two of the stress test today.  If OK then she can be discharged.  AS:  Follow clinically.    Fayrene Fearing [facility]  10:32 AM  03/18/2018

## 2018-03-18 NOTE — Progress Notes (Signed)
  Report from Dr. Anne Fu that NST is abnormal.   There was no ST segment deviation noted during stress.  Nuclear stress EF: 64%. No significant wall motion abnormalities detected  No T wave inversion was noted during stress.  Defect 1: There is a large defect of severe severity present in the basal anterior, basal anteroseptal, basal inferoseptal, mid anterior, mid anteroseptal, mid inferoseptal and apex location.  Findings consistent with ischemia.  This is a high risk study.  I informed pt of abnormal stress test results. I have reviewed the risks, indications, and alternatives to cardiac catheterization and possible angioplasty/stenting with the patient. Risks include but are not limited to bleeding, infection, vascular injury, stroke, myocardial infection, arrhythmia, kidney injury, radiation-related injury in the case of prolonged fluoroscopy use, emergency cardiac surgery, and death. The patient understands the risks of serious complication is low (<1%).   Will arrange LHC with possible intervention 4/75. Will place precath orders and will make NPO at midnight. Continue IV heparin.

## 2018-03-18 NOTE — Plan of Care (Signed)
  Problem: Clinical Measurements: Goal: Ability to maintain clinical measurements within normal limits will improve Outcome: Progressing   

## 2018-03-18 NOTE — Care Management Obs Status (Signed)
MEDICARE OBSERVATION STATUS NOTIFICATION   Patient Details  Name: Victoria Delgado MRN: [id number] Date of Birth: Apr 09, 1941   Medicare Observation Status Notification Given:  Yes    Gala Lewandowsky, RN 03/18/2018, 1:57 PM

## 2018-03-18 NOTE — H&P (View-Only) (Signed)
Progress Note  Patient Name: Victoria Delgado Date of Encounter: 03/18/2018  Primary Cardiologist: Rollene Rotunda, MD   Subjective   Feels well today. No complaints. CP free. No recurrent CP since admission.   Inpatient Medications    Scheduled Meds: . aspirin EC  81 mg Oral Daily  . cholecalciferol  1,000 Units Oral Daily  . diltiazem  240 mg Oral QHS  . furosemide  40 mg Oral Daily  . metoprolol succinate  25 mg Oral QHS  . metoprolol succinate  50 mg Oral Daily  . potassium chloride  10 mEq Oral Daily  . rosuvastatin  10 mg Oral QODAY   Continuous Infusions: . heparin 1,500 Units/hr (03/18/18 0102)   PRN Meds: acetaminophen, nitroGLYCERIN, ondansetron (ZOFRAN) IV   Vital Signs    Vitals:   03/17/18 1052 03/17/18 1944 03/17/18 2056 03/18/18 0431  BP:  (!) 123/47 (!) 125/58 124/64  Pulse: 72 62 (!) 57 (!) 50  Resp:   18 17  Temp:   98.4 F (36.9 C) 97.9 F (36.6 C)  TempSrc:   Oral Oral  SpO2:   95% 96%  Weight:    287 lb 14.4 oz (130.6 kg)  Height:        Intake/Output Summary (Last 24 hours) at 03/18/2018 0858 Last data filed at 03/18/2018 0300 Gross per 24 hour  Intake 461.5 ml  Output -  Net 461.5 ml   Filed Weights   03/16/18 1500 03/16/18 2108 03/18/18 0431  Weight: 275 lb (124.7 kg) 289 lb (131.1 kg) 287 lb 14.4 oz (130.6 kg)    Telemetry    NSR with PVCs - Personally Reviewed  ECG    NSR - Personally Reviewed  Physical Exam   GEN: No acute distress. Obese    Neck: No JVD Cardiac: RRR w/ occasional PVCs, 2/6 murmur at RUSB Respiratory: Clear to auscultation bilaterally. GI: Soft, nontender, non-distended  MS: No edema; No deformity. Neuro:  Nonfocal  Psych: Normal affect   Labs    Chemistry Recent Labs  Lab 03/16/18 1025 03/17/18 0746  NA 142 140  K 4.3 4.5  CL 105 103  CO2 27 25  GLUCOSE 155* 145*  BUN 14 15  CREATININE 0.89 0.87  CALCIUM 9.6 8.8*  GFRNONAA >60 >60  GFRAA >60 >60  ANIONGAP 10 12      Hematology Recent Labs  Lab 03/16/18 1025 03/17/18 0034 03/18/18 0529  WBC 7.0 8.1 8.8  RBC 5.00 4.74 4.76  HGB 15.1* 14.8 14.3  HCT 45.6 43.5 43.8  MCV 91.2 91.8 92.0  MCH 30.2 31.2 30.0  MCHC 33.1 34.0 32.6  RDW 13.6 13.7 13.7  PLT 204 210 203    Cardiac Enzymes Recent Labs  Lab 03/16/18 1950 03/17/18 0034 03/17/18 0746  TROPONINI 0.10* 0.09* 0.05*    Recent Labs  Lab 03/16/18 1108 03/16/18 1444  TROPIPOC 0.07 0.11*     BNPNo results for input(s): BNP, PROBNP in the last 168 hours.   DDimer No results for input(s): DDIMER in the last 168 hours.   Radiology    Dg Chest 2 View  Result Date: 03/16/2018 CLINICAL DATA:  LEFT anterior chest pain since this morning, history coronary artery disease, hypertension, atrial fibrillation, GERD, former smoker EXAM: CHEST - 2 VIEW COMPARISON:  05/11/2016 FINDINGS: Enlargement of cardiac silhouette. Mediastinal contours and pulmonary vascularity normal. Atherosclerotic calcification aorta. Lungs clear. No pleural effusion or pneumothorax. Bones demineralized. IMPRESSION: Enlargement of cardiac silhouette without acute infiltrate. Electronically Signed  By: Ulyses Southward M.D.   On: 03/16/2018 11:06    Cardiac Studies   Echo 10/30/2017 LV EF: 60% - 65%  Study Conclusions  - Left ventricle: The cavity size was normal. Wall thickness was increased in a pattern of moderate LVH. Systolic function was normal. The estimated ejection fraction was in the range of 60% to 65%. Wall motion was normal; there were no regional wall motion abnormalities. Doppler parameters are consistent with abnormal left ventricular relaxation (grade 1 diastolic dysfunction). - Aortic valve: Trileaflet; moderately calcified leaflets. There was moderate stenosis. Mean gradient (S): 28 mm Hg. Peak gradient (S): 44 mm Hg. Valve area (VTI): 1.44 cm^2. - Mitral valve: Moderately calcified annulus. There was no significant  regurgitation. - Left atrium: The atrium was moderately dilated. - Right ventricle: Poorly visualized. The cavity size was normal. Systolic function was normal. - Inferior vena cava: The vessel was normal in size. The respirophasic diameter changes were in the normal range (>= 50%), consistent with normal central venous pressure.  Impressions:  - Normal LV size with moderate LV hypertrophy. EF 60-65%. RV poorly visualized but probably normal. Moderate LAE. Moderate aortic stenosis.  NST 03/18/18 pending  Patient Profile     77 y.o.femalewith a history of PAF, CAD s/p PCI to LCx/OM1 2017, GERD, HTN, HLD, OSA, asymmetric septal hypertrophy without obstruction and mild AS who was admitted for chest pain.   Assessment & Plan    1. Chest Pain: currently pain free. No recurrent pain since admission 2 days ago. Presenting pain was different from previous angina which was left arm pain w/o CP. Cardiac enzymes mildly elevated but with flat trend. NST pending to r/o coronary ischemia.   2. CAD: s/p PCI to the LCx/OM1 in 2017. Had left arm pain at that time. Admitted for CP w/o any radiation to the left arm. Pain resolved w/o intervention. Mildly elevated troponin with flat trend, 0.09>>0.11>>0.10. EKG nonischemic. Finish 2 day stress test today to r/o ischemia. Further w/u to be determined based on stress results. Continue medical therapy for now. ASA, metoprolol and Crestor.   3. PVCs: Occasional PVCs noted. Continue metoprolol. K WNL. Monitor on telemetry.   4. Aortic Stenosis: moderate by echo in Nov 2018. Mean gradient was 28 mm Hg at that time. EF normal at 60-65%.   5. HTN: BP is controlled this morning, 124/64. Continue metoprolol and Cardizem.    For questions or updates, please contact [facility] Please consult [email or web address] for contact info under Cardiology/STEMI.      Signed, Robbie Lis, PA-C  03/18/2018, 8:58 AM    History and all data above  reviewed.  Patient examined.  I agree with the findings as above.  She has had no more chest pain.  No SOB. The patient exam reveals COR:rrr  ,  Lungs: Clear  ,  Abd: Positive bowel sounds, no rebound no guarding, Ext No edema  .  All available labs, radiology testing, previous records reviewed. Agree with documented assessment and plan. Chest pain:  Plan for part two of the stress test today.  If OK then she can be discharged.  AS:  Follow clinically.    Fayrene Fearing [facility]  10:32 AM  03/18/2018

## 2018-03-19 ENCOUNTER — Encounter (HOSPITAL_COMMUNITY): Payer: Self-pay | Admitting: Cardiovascular Disease

## 2018-03-19 ENCOUNTER — Ambulatory Visit (HOSPITAL_COMMUNITY)
Admission: EM | Disposition: A | Discharge status: Home / Self Care | Payer: Self-pay | Source: Home / Self Care | Attending: Emergency Medicine

## 2018-03-19 DIAGNOSIS — I2510 Atherosclerotic heart disease of native coronary artery without angina pectoris: Secondary | ICD-10-CM

## 2018-03-19 DIAGNOSIS — I350 Nonrheumatic aortic (valve) stenosis: Secondary | ICD-10-CM

## 2018-03-19 DIAGNOSIS — R9439 Abnormal result of other cardiovascular function study: Secondary | ICD-10-CM

## 2018-03-19 HISTORY — PX: LEFT HEART CATH AND CORONARY ANGIOGRAPHY: CATH118249

## 2018-03-19 LAB — BASIC METABOLIC PANEL
Anion gap: 8 (ref 5–15)
BUN: 14 mg/dL (ref 6–20)
CALCIUM: 8.6 mg/dL — AB (ref 8.9–10.3)
CO2: 22 mmol/L (ref 22–32)
CREATININE: 0.77 mg/dL (ref 0.44–1.00)
Chloride: 108 mmol/L (ref 101–111)
GFR calc Af Amer: >60 mL/min (ref >60)
GFR calc non Af Amer: >60 mL/min (ref >60)
GLUCOSE: 137 mg/dL — AB (ref 65–99)
Potassium: 3.5 mmol/L (ref 3.5–5.1)
Sodium: 138 mmol/L (ref 135–145)

## 2018-03-19 LAB — CBC
HEMATOCRIT: 42.8 % (ref 36.0–46.0)
Hemoglobin: 13.9 g/dL (ref 12.0–15.0)
MCH: 30.2 pg (ref 26.0–34.0)
MCHC: 32.5 g/dL (ref 30.0–36.0)
MCV: 92.8 fL (ref 78.0–100.0)
PLATELETS: 203 10*3/uL (ref 150–400)
RBC: 4.61 MIL/uL (ref 3.87–5.11)
RDW: 14.1 % (ref 11.5–15.5)
WBC: 8.4 10*3/uL (ref 4.0–10.5)

## 2018-03-19 LAB — HEPARIN LEVEL (UNFRACTIONATED): Heparin Unfractionated: 0.48 IU/mL (ref 0.30–0.70)

## 2018-03-19 LAB — PROTIME-INR
INR: 1.07
Prothrombin Time: 13.8 seconds (ref 11.4–15.2)

## 2018-03-19 SURGERY — LEFT HEART CATH AND CORONARY ANGIOGRAPHY
Anesthesia: LOCAL

## 2018-03-19 MED ORDER — VERAPAMIL HCL 2.5 MG/ML IV SOLN
INTRAVENOUS | Status: DC | PRN
  Administered 2018-03-19: 10 mL via INTRA_ARTERIAL

## 2018-03-19 MED ORDER — HEPARIN (PORCINE) IN NACL 100-0.45 UNIT/ML-% IJ SOLN
1500.0000 [IU]/h | INTRAMUSCULAR | Status: DC
  Administered 2018-03-19: 1500 [IU]/h via INTRAVENOUS

## 2018-03-19 MED ORDER — HEPARIN (PORCINE) IN NACL 2-0.9 UNITS/ML
INTRAMUSCULAR | Status: AC | PRN
  Administered 2018-03-19 (×2): 500 mL

## 2018-03-19 MED ORDER — VERAPAMIL HCL 2.5 MG/ML IV SOLN
INTRAVENOUS | Status: AC
  Filled 2018-03-19: qty 2

## 2018-03-19 MED ORDER — HEPARIN (PORCINE) IN NACL 1000-0.9 UT/500ML-% IV SOLN
INTRAVENOUS | Status: AC
  Filled 2018-03-19: qty 1000

## 2018-03-19 MED ORDER — LIDOCAINE HCL (PF) 1 % IJ SOLN
INTRAMUSCULAR | Status: AC
  Filled 2018-03-19: qty 30

## 2018-03-19 MED ORDER — SODIUM CHLORIDE 0.9% FLUSH: 3.0000 mL | Freq: Two times a day (BID) | INTRAVENOUS | Status: DC

## 2018-03-19 MED ORDER — FENTANYL CITRATE (PF) 100 MCG/2ML IJ SOLN
INTRAMUSCULAR | Status: DC | PRN
  Administered 2018-03-19 (×2): 25 ug via INTRAVENOUS

## 2018-03-19 MED ORDER — IOHEXOL 350 MG/ML SOLN
INTRAVENOUS | Status: DC | PRN
  Administered 2018-03-19: 70 mL

## 2018-03-19 MED ORDER — FENTANYL CITRATE (PF) 100 MCG/2ML IJ SOLN
INTRAMUSCULAR | Status: AC
  Filled 2018-03-19: qty 2

## 2018-03-19 MED ORDER — SODIUM CHLORIDE 0.9 % IV SOLN
INTRAVENOUS | Status: AC
  Administered 2018-03-19: 12:00:00 via INTRAVENOUS

## 2018-03-19 MED ORDER — HEPARIN SODIUM (PORCINE) 1000 UNIT/ML IJ SOLN
INTRAMUSCULAR | Status: AC
  Filled 2018-03-19: qty 1

## 2018-03-19 MED ORDER — MIDAZOLAM HCL 2 MG/2ML IJ SOLN
INTRAMUSCULAR | Status: AC
Start: 2018-03-19 — End: 2018-03-19
  Filled 2018-03-19: qty 2

## 2018-03-19 MED ORDER — MIDAZOLAM HCL 2 MG/2ML IJ SOLN
INTRAMUSCULAR | Status: DC | PRN
  Administered 2018-03-19 (×2): 1 mg via INTRAVENOUS

## 2018-03-19 MED ORDER — SODIUM CHLORIDE 0.9 % IV SOLN: 250.0000 mL | INTRAVENOUS | Status: DC | PRN

## 2018-03-19 MED ORDER — LIDOCAINE HCL (PF) 1 % IJ SOLN
INTRAMUSCULAR | Status: DC | PRN
  Administered 2018-03-19: 2 mL

## 2018-03-19 MED ORDER — SODIUM CHLORIDE 0.9% FLUSH: 3.0000 mL | INTRAVENOUS | Status: DC | PRN

## 2018-03-19 MED ORDER — HEPARIN SODIUM (PORCINE) 1000 UNIT/ML IJ SOLN
INTRAMUSCULAR | Status: DC | PRN
  Administered 2018-03-19: 6000 [IU] via INTRAVENOUS

## 2018-03-19 SURGICAL SUPPLY — 12 items
CATH INFINITI 5 FR JL3.5 (CATHETERS) ×2 IMPLANT
CATH INFINITI 5FR MULTPACK ANG (CATHETERS) ×2 IMPLANT
DEVICE RAD COMP TR BAND LRG (VASCULAR PRODUCTS) ×2 IMPLANT
GUIDEWIRE INQWIRE 1.5J.035X260 (WIRE) ×1 IMPLANT
INQWIRE 1.5J .035X260CM (WIRE) ×2
KIT HEART LEFT (KITS) ×2 IMPLANT
NEEDLE PERC 21GX4CM (NEEDLE) ×2 IMPLANT
PACK CARDIAC CATHETERIZATION (CUSTOM PROCEDURE TRAY) ×2 IMPLANT
SHEATH RAIN 4/5FR (SHEATH) IMPLANT
SHEATH RAIN RADIAL 21G 6FR (SHEATH) ×2 IMPLANT
TRANSDUCER W/STOPCOCK (MISCELLANEOUS) ×2 IMPLANT
TUBING CIL FLEX 10 FLL-RA (TUBING) ×2 IMPLANT

## 2018-03-19 NOTE — Progress Notes (Signed)
ANTICOAGULATION CONSULT NOTE -follow up Pharmacy Consult for Heparin  Indication: chest pain/ACS  Allergies  Allergen Reactions  . Other Other (See Comments)    Gas bubble C3F8 has green bracelet **  . Atorvastatin Other (See Comments)    myalgias  . Codeine Nausea Only  . Kenalog [Triamcinolone Acetonide] Other (See Comments)    Had A.fib after she received the injection."Steroid meds"  . Relafen [Nabumetone] Other (See Comments)    Edema   . Metoprolol Other (See Comments)    Generic metoprolol XL doesn't work for her after a few days. She takes brand name only Toprol XL. (per Cardiologist and confirmed with [facility])  . Penicillins Rash    40 years ago, injection site was red and swollen.   Has patient had a PCN reaction causing immediate rash, facial/tongue/throat swelling, SOB or lightheadedness with hypotension: YES Has patient had a PCN reaction causing severe rash involving mucus membranes or skin necrosis: NO Has patient had a PCN reaction that required hospitalization. No  Has patient had a PCN reaction occurring within the last 10 years: NO If all of the above answers are "NO", then may proceed with Cephalosporin use.    . Tramadol Other (See Comments)    Auditory halllucinations    Patient Measurements: Height: 5\' 8"  (172.7 cm) Weight: 288 lb 1.6 oz (130.7 kg) IBW/kg (Calculated) : 63.9 Heparin Dosing Weight: 95.2 kg  Vital Signs: Temp: 98 F (36.7 C) (04/17 0610) Temp Source: Oral (04/17 0610) BP: 111/54 (04/17 0610) Pulse Rate: 66 (04/17 0953)  Labs: Recent Labs    03/16/18 1950  03/17/18 0034 03/17/18 0746  03/17/18 2023 03/18/18 0529 03/19/18 0402  HGB  --    < > 14.8  --   --   --  14.3 13.9  HCT  --   --  43.5  --   --   --  43.8 42.8  PLT  --   --  210  --   --   --  203 203  LABPROT  --   --   --   --   --   --   --  13.8  INR  --   --   --   --   --   --   --  1.07  HEPARINUNFRC  --   --  0.41  --    < > 0.55 0.60 0.48   CREATININE  --   --   --  0.87  --   --   --  0.77  TROPONINI 0.10*  --  0.09* 0.05*  --   --   --   --    < > = values in this interval not displayed.    Estimated Creatinine Clearance: 88.2 mL/min (by C-G formula based on SCr of 0.77 mg/dL).   Medical History: Past Medical History:  Diagnosis Date  . Arthritis    osteoarthritis-hips,knees, back, hands  . Asymmetric septal hypertrophy (HCC)   . Atrial fibrillation (HCC)   . CAD (coronary artery disease)    2 drug-eluting stents placed in the circumflex leading into a marginal.  May 2017.  [name]  . Cataract    corrective surgery done  . Fatty liver   . Gallstones   . GERD (gastroesophageal reflux disease)   . Heart palpitations   . Hepatitis    hepatitis A; past hx.,many yrs ago ? food source  . Hernia, hiatal   . Hyperlipidemia   .  Hypertension   . Obesity   . Sleep apnea    CPAP    Medications:  Medications Prior to Admission  Medication Sig Dispense Refill Last Dose  . acetaminophen (TYLENOL) 500 MG tablet Take 500 mg by mouth every 6 (six) hours as needed for mild pain.   unk at prn  . aspirin EC 81 MG tablet Take 81 mg by mouth daily.   03/15/2018 at Unknown time  . Cal Carb-Mag Hydrox-Simeth (ROLAIDS ADVANCED PO) Take 2 tablets by mouth as needed (indigestion).   03/15/2018 at Unknown time  . CARTIA XT 240 MG 24 hr capsule TAKE 1 CAPSULE(240 MG) BY MOUTH AT BEDTIME 90 capsule 3 03/15/2018 at Unknown time  . Cholecalciferol (VITAMIN D) 2000 units CAPS Take 1 capsule by mouth daily.   03/16/2018 at Unknown time  . CINNAMON PO Take 1,000 mg by mouth daily.   03/15/2018 at Unknown time  . CRESTOR 10 MG tablet Take 1 tablet (10 mg total) as directed by mouth. (Patient taking differently: Take 10 mg by mouth as directed. Taking every other day) 90 tablet 3 03/15/2018 at Unknown time  . furosemide (LASIX) 40 MG tablet TAKE 1 TABLET BY MOUTH EVERY DAY WITH POTASSIUM PILL 90 tablet 3 03/16/2018 at Unknown time  . metoprolol  succinate (TOPROL XL) 50 MG 24 hr tablet TAKE 1 TABLET BY MOUTH EVERY MORNING AND 1/2 TABLET EVERY EVENING 135 tablet 2 03/16/2018 at 0815  . nitroGLYCERIN (NITROSTAT) 0.4 MG SL tablet Place 1 tablet (0.4 mg total) under the tongue every 5 (five) minutes as needed for chest pain. 25 tablet 3 unk at prn  . potassium chloride (K-DUR,KLOR-CON) 10 MEQ tablet Take 10 mEq by mouth daily.    03/16/2018 at Unknown time  . Propylene Glycol (SYSTANE BALANCE OP) Place 1 drop into both eyes daily as needed (DRY EYES).   03/15/2018 at Unknown time  . ranitidine (ZANTAC) 150 MG tablet Take 150 mg by mouth 2 (two) times daily.   03/15/2018 at Unknown time   Scheduled:  . [START ON 03/20/2018] aspirin EC  81 mg Oral Daily  . cholecalciferol  1,000 Units Oral Daily  . diltiazem  240 mg Oral QHS  . furosemide  40 mg Oral Daily  . potassium chloride  10 mEq Oral Daily  . rosuvastatin  10 mg Oral QODAY  . sodium chloride flush  3 mL Intravenous Q12H  . metoprolol succinate  25 mg Oral QHS  . metoprolol succinate  50 mg Oral Daily    Assessment:  77 y.o. female with chest pain for heparin. No recurrent CP per cardiologist.  Stress imaging yesterday with findings consistent with ischemia.  Heparin level remains therapeutic at 0.48 on heparin 1500 units/hr.  No bleeding noted.  CBC wnl. Plan is for cardiac cath today.   Goal of Therapy:  Heparin level 0.3-0.7 units/ml Monitor platelets by anticoagulation protocol: Yes   Plan:  Continue heparin gtt at 1,500 units/hr Monitor daily heparin level, CBC, s/s of bleed F/u after cath today   Noah Delaine, RPh Clinical Pharmacist Pager: [phone] or [phone] (330p-1030p) [facility] [phone] 03/19/2018 10:28 AM

## 2018-03-19 NOTE — Progress Notes (Addendum)
Progress Note  Patient Name: Victoria Delgado Date of Encounter: 03/19/2018  Primary Cardiologist: Rollene Rotunda, MD   Subjective   Denies any CP or SOB.   Inpatient Medications    Scheduled Meds: . [START ON 03/20/2018] aspirin EC  81 mg Oral Daily  . cholecalciferol  1,000 Units Oral Daily  . diltiazem  240 mg Oral QHS  . furosemide  40 mg Oral Daily  . potassium chloride  10 mEq Oral Daily  . rosuvastatin  10 mg Oral QODAY  . sodium chloride flush  3 mL Intravenous Q12H  . metoprolol succinate  25 mg Oral QHS  . metoprolol succinate  50 mg Oral Daily   Continuous Infusions: . sodium chloride    . sodium chloride     PRN Meds: sodium chloride, acetaminophen, nitroGLYCERIN, ondansetron (ZOFRAN) IV, sodium chloride flush, technetium tetrofosmin   Vital Signs    Vitals:   03/18/18 1436 03/18/18 2042 03/18/18 2232 03/19/18 0610  BP: 123/74 (!) 160/79 118/61 (!) 111/54  Pulse: 73 70 75 62  Resp:  18  18  Temp:  98.5 F (36.9 C)  98 F (36.7 C)  TempSrc:  Oral  Oral  SpO2:  97%  97%  Weight:    288 lb 1.6 oz (130.7 kg)  Height:        Intake/Output Summary (Last 24 hours) at 03/19/2018 0909 Last data filed at 03/19/2018 0200 Gross per 24 hour  Intake 843 ml  Output -  Net 843 ml   Filed Weights   03/16/18 2108 03/18/18 0431 03/19/18 0610  Weight: 289 lb (131.1 kg) 287 lb 14.4 oz (130.6 kg) 288 lb 1.6 oz (130.7 kg)    Telemetry    NSR with PVCs - Personally Reviewed  ECG    Sinus bradycardia, PVC - Personally Reviewed  Physical Exam   GEN: No acute distress.   Neck: No JVD Cardiac: RRR, no rubs, or gallops.  2/6 systolic murmur Respiratory: Clear to auscultation bilaterally. GI: Soft, nontender, non-distended  MS: No edema; No deformity. Neuro:  Nonfocal  Psych: Normal affect   Labs    Chemistry Recent Labs  Lab 03/16/18 1025 03/17/18 0746 03/19/18 0402  NA 142 140 138  K 4.3 4.5 3.5  CL 105 103 108  CO2 27 25 22   GLUCOSE 155* 145*  137*  BUN 14 15 14   CREATININE 0.89 0.87 0.77  CALCIUM 9.6 8.8* 8.6*  GFRNONAA >60 >60 >60  GFRAA >60 >60 >60  ANIONGAP 10 12 8      Hematology Recent Labs  Lab 03/17/18 0034 03/18/18 0529 03/19/18 0402  WBC 8.1 8.8 8.4  RBC 4.74 4.76 4.61  HGB 14.8 14.3 13.9  HCT 43.5 43.8 42.8  MCV 91.8 92.0 92.8  MCH 31.2 30.0 30.2  MCHC 34.0 32.6 32.5  RDW 13.7 13.7 14.1  PLT 210 203 203    Cardiac Enzymes Recent Labs  Lab 03/16/18 1950 03/17/18 0034 03/17/18 0746  TROPONINI 0.10* 0.09* 0.05*    Recent Labs  Lab 03/16/18 1108 03/16/18 1444  TROPIPOC 0.07 0.11*     BNPNo results for input(s): BNP, PROBNP in the last 168 hours.   DDimer No results for input(s): DDIMER in the last 168 hours.   Radiology    Nm Myocar Multi W/spect W/wall Motion / Ef  Result Date: 03/18/2018  There was no ST segment deviation noted during stress.  Nuclear stress EF: 64%. No significant wall motion abnormalities detected  No T wave inversion  was noted during stress.  Defect 1: There is a large defect of severe severity present in the basal anterior, basal anteroseptal, basal inferoseptal, mid anterior, mid anteroseptal, mid inferoseptal and apex location.  Findings consistent with ischemia.  This is a high risk study.  Donato Schultz, MD    Cardiac Studies   Echo 10/30/2017 LV EF: 60% - 65%  Study Conclusions  - Left ventricle: The cavity size was normal. Wall thickness was increased in a pattern of moderate LVH. Systolic function was normal. The estimated ejection fraction was in the range of 60% to 65%. Wall motion was normal; there were no regional wall motion abnormalities. Doppler parameters are consistent with abnormal left ventricular relaxation (grade 1 diastolic dysfunction). - Aortic valve: Trileaflet; moderately calcified leaflets. There was moderate stenosis. Mean gradient (S): 28 mm Hg. Peak gradient (S): 44 mm Hg. Valve area (VTI): 1.44 cm^2. -  Mitral valve: Moderately calcified annulus. There was no significant regurgitation. - Left atrium: The atrium was moderately dilated. - Right ventricle: Poorly visualized. The cavity size was normal. Systolic function was normal. - Inferior vena cava: The vessel was normal in size. The respirophasic diameter changes were in the normal range (>= 50%), consistent with normal central venous pressure.  Impressions:  - Normal LV size with moderate LV hypertrophy. EF 60-65%. RV poorly visualized but probably normal. Moderate LAE. Moderate aortic stenosis.   Myoview 03/18/2018  There was no ST segment deviation noted during stress.  Nuclear stress EF: 64%. No significant wall motion abnormalities detected  No T wave inversion was noted during stress.  Defect 1: There is a large defect of severe severity present in the basal anterior, basal anteroseptal, basal inferoseptal, mid anterior, mid anteroseptal, mid inferoseptal and apex location.  Findings consistent with ischemia.  This is a high risk study.   Patient Profile     77 y.o. female with a history of PAF, CAD s/p PCI to LCx/OM1 2017, GERD, HTN, HLD, OSA, asymmetric septal hypertrophy without obstruction and mild AS whowas admitted forchest pain.  Assessment & Plan    1. Chest pain  - myoview 03/18/2018 EF 64%, large defect of severe severity present in multiple locations concerning for ischemia.   - pending cath today, called cath lab current on at 5:30PM, however maybe moved up if there is cancellation. NPO for now, IVF  - Risk and benefit of procedure explained to the patient who display clear understanding and agree to proceed.   - may also consider cross the aortic valve during cath to measure gradient, moderate AS by echo in 2018  Discussed with patient possible procedural risk include bleeding, vascular injury, renal injury, arrythmia, MI, stroke and loss of limb or life  2. CAD: s/p DES to mid LCx and  D1 based on previous cath report from [facility]  3. PVCs: continue metoprolol  4. Aortic stenosis: moderate by echo Nov 2018  5. HTN: BP stable   For questions or updates, please contact [facility] Please consult [email or web address] for contact info under Cardiology/STEMI.      Ramond Dial, PA  03/19/2018, 9:09 AM    History and all data above reviewed.  Patient examined.  I agree with the findings as above.  She has had no further pain.  No SOB.    The patient exam reveals COR:RRR, no rub  ,  Lungs: Clear  ,  Abd: Positive bowel sounds, no rebound no guarding, Ext No edema  .  All available labs, radiology testing, previous records reviewed. Agree with documented assessment and plan. Chest pain:  Mildly elevated troponin but no high grade obstructive CAD on cath.  Plan to discharge later today.    Fayrene Fearing Nolita Kutter  12:11 PM  03/19/2018

## 2018-03-19 NOTE — Interval H&P Note (Signed)
History and Physical Interval Note:  03/19/2018 10:49 AM  Victoria Delgado  has presented today for cardiac cath with the diagnosis of unstable angina/NSTEMI.  The various methods of treatment have been discussed with the patient and family. After consideration of risks, benefits and other options for treatment, the patient has consented to  Procedure(s): LEFT HEART CATH AND CORONARY ANGIOGRAPHY (N/A) as a surgical intervention .  The patient's history has been reviewed, patient examined, no change in status, stable for surgery.  I have reviewed the patient's chart and labs.  Questions were answered to the patient's satisfaction.    Cath Lab Visit (complete for each Cath Lab visit)  Clinical Evaluation Leading to the Procedure:   ACS: Yes.    Non-ACS:    Anginal Classification: CCS III  Anti-ischemic medical therapy: Minimal Therapy (1 class of medications)  Non-Invasive Test Results: High-risk stress test findings: cardiac mortality >3%/year  Prior CABG: No previous CABG         Verne Carrow

## 2018-03-19 NOTE — Discharge Summary (Signed)
Discharge Summary    Patient ID: Victoria Delgado,  MRN: [id number], DOB/AGE: 02-07-1941 77 y.o.  Admit date: 03/16/2018 Discharge date: 03/19/2018  Primary Care Provider: [facility] Primary Cardiologist: Rollene Rotunda, MD  Discharge Diagnoses    Principal Problem:   Chest pain Active Problems:   HOCM (hypertrophic obstructive cardiomyopathy) (HCC)   HTN (hypertension)   Sleep apnea   Mixed hyperlipidemia   Morbid obesity (HCC)   Statin-induced myositis   Edema   PAF (paroxysmal atrial fibrillation) (HCC)   LVH (left ventricular hypertrophy)   Abnormal nuclear stress test   Moderate aortic stenosis   Allergies Allergies  Allergen Reactions  . Other Other (See Comments)    Gas bubble C3F8 has green bracelet **  . Atorvastatin Other (See Comments)    myalgias  . Codeine Nausea Only  . Kenalog [Triamcinolone Acetonide] Other (See Comments)    Had A.fib after she received the injection."Steroid meds"  . Relafen [Nabumetone] Other (See Comments)    Edema   . Metoprolol Other (See Comments)    Generic metoprolol XL doesn't work for her after a few days. She takes brand name only Toprol XL. (per Cardiologist and confirmed with [facility])  . Penicillins Rash    40 years ago, injection site was red and swollen.   Has patient had a PCN reaction causing immediate rash, facial/tongue/throat swelling, SOB or lightheadedness with hypotension: YES Has patient had a PCN reaction causing severe rash involving mucus membranes or skin necrosis: NO Has patient had a PCN reaction that required hospitalization. No  Has patient had a PCN reaction occurring within the last 10 years: NO If all of the above answers are "NO", then may proceed with Cephalosporin use.    . Tramadol Other (See Comments)    Auditory halllucinations    Diagnostic Studies/Procedures     Myoview 03/18/2018  There was no ST segment deviation noted during stress.  Nuclear stress EF:  64%. No significant wall motion abnormalities detected  No T wave inversion was noted during stress.  Defect 1: There is a large defect of severe severity present in the basal anterior, basal anteroseptal, basal inferoseptal, mid anterior, mid anteroseptal, mid inferoseptal and apex location.  Findings consistent with ischemia.  This is a high risk study.   Cath 03/19/2018 Conclusion     Mid RCA lesion is 10% stenosed.  Previously placed Prox Cx to Mid Cx stent (unknown type) is widely patent.  Ost Cx to Prox Cx lesion is 20% stenosed.  Ost 3rd Mrg lesion is 40% stenosed.  Previously placed Ost 1st Diag to 1st Diag stent (unknown type) is widely patent.  Ost LAD to Prox LAD lesion is 20% stenosed.  Mid LAD lesion is 20% stenosed.  There is moderate aortic valve stenosis.   1. The left main has no disease.  2. The LAD is large vessel that courses to the apex. There is mild plaque in the proximal and mid LAD. The Diagonal branch has a patent stent with no restenosis.  3. The Circumflex is a large caliber vessel with a patent mid to distal stent with no restenosis. The obtuse marginal branch is jailed by the stent with 40% ostial stenosis.  4. The RCA is a large dominant vessel with mild plaque 5. There is moderate aortic stenosis (mean gradient 26.7 mmHg, peak to peak gradient 29 mmHg). I easily crossed the valve with a JR4 catheter.  6. False positive stress test  Recommendations:  Continue medical management of CAD. Follow her aortic stenosis for now as it appears to be moderate.     _____________   History of Present Illness      Victoria Delgado is very pleasant 77 year old female with PAF, CAD s/p PCI to LCx/OM1 2017, GERD, HTN, HLD, OSA on CPAP, morbid obesity, asymmetric septal hypertrophy without obstruction and moderate AS.  Last cardiac catheterization was in 2017 at [facility].  Interestingly, her symptom at the time was left arm pain.  Last echocardiogram  obtained on 10/30/2017 showed EF 60-65%, moderate LVH, grade 1 DD, moderate aortic stenosis, moderately calcified mitral valve without significant regurgitation.  She was last seen by Dr. Antoine Poche on 10/15/2017, she was doing well at the time.  She has not been feeling very well for the past week, this morning she woke up 7:45AM with substernal chest pain.  She denies any exacerbating factors such as deep inspiration, body rotational palpation.  The episode lasted about 4 hours and eventually went away around noon.  She never had this type of chest pain before.  Initial EKG was normal without noticeable ischemic changes.  Point-of-care troponin was 0.07, however subsequent point-of-care troponin went up to 0.11.  Cardiology has been consulted for potential NSTEMI.  Chest x-ray was negative for acute process.   Hospital Course       Patient was admitted to cardiology service, overnight, serial troponin was flat at 0.1-0.09-0.05.  Lipid panel obtained on the following morning showed HDL 34, LDL 102.  Given the flat trend of troponin, it was decided to pursue 2-day nuclear stress test.  2-day Myoview was eventually completed on 03/18/2018, this showed EF 64%, large defect of severe severity present in the basal anterior, basal anteroseptal, basal inferoseptal, mid anterior, mid anteroseptal, mid inferoseptal and apex location consistent with ischemia.  She eventually underwent cardiac catheterization on 03/19/2018, this showed patent stent in the proximal to mid left circumflex artery, 20% ostial left circumflex artery disease, 40% OM 3 disease, widely patent stent in the ostial D1, 20% ostial to proximal LAD disease, 20% mid LAD disease.  Cardiac catheterization did confirm moderate aortic stenosis with mean gradient of 26.7 mmHg.  Medical therapy was recommended for her coronary artery disease.  It was felt that she had a false positive stress test.  Patient was seen post-cath, she denies any significant  discomfort.  She is deemed stable for discharge from cardiology perspective.  She has her follow-up with Dr. Antoine Poche in May.  _____________  Discharge Vitals Blood pressure (!) 105/51, pulse 63, temperature 98 F (36.7 C), temperature source Oral, resp. rate 13, height 5\' 8"  (1.727 m), weight 288 lb 1.6 oz (130.7 kg), SpO2 97 %.  Filed Weights   03/16/18 2108 03/18/18 0431 03/19/18 0610  Weight: 289 lb (131.1 kg) 287 lb 14.4 oz (130.6 kg) 288 lb 1.6 oz (130.7 kg)    Labs & Radiologic Studies    CBC Recent Labs    03/18/18 0529 03/19/18 0402  WBC 8.8 8.4  HGB 14.3 13.9  HCT 43.8 42.8  MCV 92.0 92.8  PLT 203 203   Basic Metabolic Panel Recent Labs    45/40/98 0746 03/19/18 0402  NA 140 138  K 4.5 3.5  CL 103 108  CO2 25 22  GLUCOSE 145* 137*  BUN 15 14  CREATININE 0.87 0.77  CALCIUM 8.8* 8.6*   Liver Function Tests No results for input(s): AST, ALT, ALKPHOS, BILITOT, PROT, ALBUMIN in the last 72 hours.  No results for input(s): LIPASE, AMYLASE in the last 72 hours. Cardiac Enzymes Recent Labs    03/16/18 1950 03/17/18 0034 03/17/18 0746  TROPONINI 0.10* 0.09* 0.05*   BNP Invalid input(s): POCBNP D-Dimer No results for input(s): DDIMER in the last 72 hours. Hemoglobin A1C No results for input(s): HGBA1C in the last 72 hours. Fasting Lipid Panel Recent Labs    03/17/18 0034  CHOL 173  HDL 34*  LDLCALC 102*  TRIG 184*  CHOLHDL 5.1   Thyroid Function Tests No results for input(s): TSH, T4TOTAL, T3FREE, THYROIDAB in the last 72 hours.  Invalid input(s): FREET3 _____________  Dg Chest 2 View  Result Date: 03/16/2018 CLINICAL DATA:  LEFT anterior chest pain since this morning, history coronary artery disease, hypertension, atrial fibrillation, GERD, former smoker EXAM: CHEST - 2 VIEW COMPARISON:  05/11/2016 FINDINGS: Enlargement of cardiac silhouette. Mediastinal contours and pulmonary vascularity normal. Atherosclerotic calcification aorta. Lungs  clear. No pleural effusion or pneumothorax. Bones demineralized. IMPRESSION: Enlargement of cardiac silhouette without acute infiltrate. Electronically Signed   By: Ulyses Southward M.D.   On: 03/16/2018 11:06   Nm Myocar Multi W/spect W/wall Motion / Ef  Result Date: 03/18/2018  There was no ST segment deviation noted during stress.  Nuclear stress EF: 64%. No significant wall motion abnormalities detected  No T wave inversion was noted during stress.  Defect 1: There is a large defect of severe severity present in the basal anterior, basal anteroseptal, basal inferoseptal, mid anterior, mid anteroseptal, mid inferoseptal and apex location.  Findings consistent with ischemia.  This is a high risk study.  Donato Schultz, MD   Disposition   Pt is being discharged home today in good condition.  Follow-up Plans & Appointments    Follow-up Information    Rollene Rotunda, MD Follow up on 05/01/2018.   Specialty:  Cardiology Why:  11:00AM. Cardiology visit Contact information: [address] [place] [address] [phone]          Discharge Instructions    Diet - low sodium heart healthy   Complete by:  As directed    Discharge instructions   Complete by:  As directed    No driving for 24 hours. No lifting over 5 lbs for 1 week. No sexual activity for 1 week.Keep procedure site clean & dry. If you notice increased pain, swelling, bleeding or pus, call/return!  You may shower, but no soaking baths/hot tubs/pools for 1 week.   Increase activity slowly   Complete by:  As directed       Discharge Medications   Allergies as of 03/19/2018      Reactions   Other Other (See Comments)   Gas bubble C3F8 has green bracelet **   Atorvastatin Other (See Comments)   myalgias   Codeine Nausea Only   Kenalog [triamcinolone Acetonide] Other (See Comments)   Had A.fib after she received the injection."Steroid meds"   Relafen [nabumetone] Other (See Comments)   Edema   Metoprolol  Other (See Comments)   Generic metoprolol XL doesn't work for her after a few days. She takes brand name only Toprol XL. (per Cardiologist and confirmed with [facility])   Penicillins Rash   40 years ago, injection site was red and swollen.  Has patient had a PCN reaction causing immediate rash, facial/tongue/throat swelling, SOB or lightheadedness with hypotension: YES Has patient had a PCN reaction causing severe rash involving mucus membranes or skin necrosis: NO Has patient had a PCN reaction that  required hospitalization. No  Has patient had a PCN reaction occurring within the last 10 years: NO If all of the above answers are "NO", then may proceed with Cephalosporin use.    Tramadol Other (See Comments)   Auditory halllucinations      Medication List    STOP taking these medications   acetaminophen 500 MG tablet Commonly known as:  TYLENOL     TAKE these medications   aspirin EC 81 MG tablet Take 81 mg by mouth daily.   CARTIA XT 240 MG 24 hr capsule Generic drug:  diltiazem TAKE 1 CAPSULE(240 MG) BY MOUTH AT BEDTIME   CINNAMON PO Take 1,000 mg by mouth daily.   CRESTOR 10 MG tablet Generic drug:  rosuvastatin Take 1 tablet (10 mg total) as directed by mouth. What changed:  additional instructions   furosemide 40 MG tablet Commonly known as:  LASIX TAKE 1 TABLET BY MOUTH EVERY DAY WITH POTASSIUM PILL   metoprolol succinate 50 MG 24 hr tablet Commonly known as:  TOPROL XL TAKE 1 TABLET BY MOUTH EVERY MORNING AND 1/2 TABLET EVERY EVENING   nitroGLYCERIN 0.4 MG SL tablet Commonly known as:  NITROSTAT Place 1 tablet (0.4 mg total) under the tongue every 5 (five) minutes as needed for chest pain.   potassium chloride 10 MEQ tablet Commonly known as:  K-DUR,KLOR-CON Take 10 mEq by mouth daily.   ROLAIDS ADVANCED PO Take 2 tablets by mouth as needed (indigestion).   SYSTANE BALANCE OP Place 1 drop into both eyes daily as needed (DRY EYES).   Vitamin D  2000 units Caps Take 1 capsule by mouth daily.   ZANTAC 150 MG tablet Generic drug:  ranitidine Take 150 mg by mouth 2 (two) times daily.         Outstanding Labs/Studies   N/A  Duration of Discharge Encounter   Greater than 30 minutes including physician time.  SignedAzalee Course NP 03/19/2018, 3:31 PM

## 2018-03-20 MED FILL — Heparin Sod (Porcine)-NaCl IV Soln 1000 Unit/500ML-0.9%: INTRAVENOUS | Qty: 1000 | Status: AC

## 2018-03-26 ENCOUNTER — Other Ambulatory Visit: Payer: Self-pay | Admitting: Internal Medicine

## 2018-03-26 DIAGNOSIS — Z1231 Encounter for screening mammogram for malignant neoplasm of breast: Secondary | ICD-10-CM

## 2018-04-21 ENCOUNTER — Ambulatory Visit
Admission: RE | Admit: 2018-04-21 | Discharge: 2018-04-21 | Disposition: A | Discharge status: Home / Self Care | Payer: Medicare Other | Source: Ambulatory Visit | Attending: Internal Medicine | Admitting: Internal Medicine

## 2018-04-21 DIAGNOSIS — Z1231 Encounter for screening mammogram for malignant neoplasm of breast: Secondary | ICD-10-CM

## 2018-04-30 ENCOUNTER — Encounter: Payer: Self-pay | Admitting: Cardiology

## 2018-04-30 NOTE — Progress Notes (Signed)
HPI The patient presents for follow up of CAD.  She has atrial fibrillation which she ultimately thinks was related to a steroid injection. This was in 2008. At that time she was found to have asymmetric septal hypertrophy. This has been followed conservatively.  Echocardiogram did demonstrate concentric LV hypertrophy which was moderate and moderate LAE.  In May of 2017 she was in the hospital with CAD.  She was admitted to [facility] on 5/24 after waking up with left arm pain.  She had positive troponin with  PCI to the mid LCX and 1st DX. She is on DAPT with aspirin/Effient. She had 2 drug-eluting stents placed in the circumflex leading into a marginal. This was apparently somewhat small circumflex. She also had a diagonal had a drug-eluting stent placed. She had a preserved ejection fraction. An echo in our office showed well preserved ejection fraction. There was some moderate aortic stenosis. There was no mention of hypertrophic obstruction although there is LVH.  Since I last saw her she was in the hospital in April with chest pain.  I reviewed these records for this visit.   She did have positive cardiac enzymes.   She underwent cardiac catheterization on 03/19/2018, this showed patent stent in the proximal to mid left circumflex artery, 20% ostial left circumflex artery disease, 40% OM 3 disease, widely patent stent in the ostial D1, 20% ostial to proximal LAD disease, 20% mid LAD disease.  Cardiac catheterization did confirm moderate aortic stenosis with mean gradient of 26.7 mmHg.  Medical therapy was recommended for her coronary artery disease.    Since that admission she has done well.  The patient denies any new symptoms such as chest discomfort, neck or arm discomfort. There has been no new shortness of breath, PND or orthopnea. There has been no reported  presyncope or syncope.  She has mild orthostatic symptoms  .   Allergies  Allergen Reactions  . Other Other (See Comments)    Gas  bubble C3F8 has green bracelet **  . Atorvastatin Other (See Comments)    myalgias  . Codeine Nausea Only  . Kenalog [Triamcinolone Acetonide] Other (See Comments)    Had A.fib after she received the injection."Steroid meds"  . Relafen [Nabumetone] Other (See Comments)    Edema   . Metoprolol Other (See Comments)    Generic metoprolol XL doesn't work for her after a few days. She takes brand name only Toprol XL. (per Cardiologist and confirmed with [facility])  . Penicillins Rash    40 years ago, injection site was red and swollen.   Has patient had a PCN reaction causing immediate rash, facial/tongue/throat swelling, SOB or lightheadedness with hypotension: YES Has patient had a PCN reaction causing severe rash involving mucus membranes or skin necrosis: NO Has patient had a PCN reaction that required hospitalization. No  Has patient had a PCN reaction occurring within the last 10 years: NO If all of the above answers are "NO", then may proceed with Cephalosporin use.    . Tramadol Other (See Comments)    Auditory halllucinations    Current Outpatient Medications  Medication Sig Dispense Refill  . aspirin EC 81 MG tablet Take 81 mg by mouth daily.    . Cal Carb-Mag Hydrox-Simeth (ROLAIDS ADVANCED PO) Take 2 tablets by mouth as needed (indigestion).    . CARTIA XT 240 MG 24 hr capsule TAKE 1 CAPSULE(240 MG) BY MOUTH AT BEDTIME 90 capsule 3  . Cholecalciferol (VITAMIN D) 2000  units CAPS Take 1 capsule by mouth daily.    Marland Kitchen CINNAMON PO Take 1,000 mg by mouth daily.    . CRESTOR 10 MG tablet Take 1 tablet (10 mg total) as directed by mouth. (Patient taking differently: Take 10 mg by mouth as directed. Taking every other day) 90 tablet 3  . furosemide (LASIX) 40 MG tablet TAKE 1 TABLET BY MOUTH EVERY DAY WITH POTASSIUM PILL 90 tablet 3  . metoprolol succinate (TOPROL XL) 50 MG 24 hr tablet TAKE 1 TABLET BY MOUTH EVERY MORNING AND 1/2 TABLET EVERY EVENING 135 tablet 2  .  nitroGLYCERIN (NITROSTAT) 0.4 MG SL tablet Place 1 tablet (0.4 mg total) under the tongue every 5 (five) minutes as needed for chest pain. 25 tablet 3  . potassium chloride (K-DUR,KLOR-CON) 10 MEQ tablet Take 10 mEq by mouth daily.     Marland Kitchen Propylene Glycol (SYSTANE BALANCE OP) Place 1 drop into both eyes daily as needed (DRY EYES).    . ranitidine (ZANTAC) 150 MG tablet Take 150 mg by mouth 2 (two) times daily.     No current facility-administered medications for this visit.     Past Medical History:  Diagnosis Date  . Arthritis    osteoarthritis-hips,knees, back, hands  . Asymmetric septal hypertrophy (HCC)   . Atrial fibrillation (HCC)   . CAD (coronary artery disease)    2 drug-eluting stents placed in the circumflex leading into a marginal.  May 2017.  [name].   catheterization on 03/19/2018, this showed patent stent in the proximal to mid left circumflex artery, 20% ostial left circumflex artery disease, 40% OM 3 disease, widely patent stent in the ostial D1, 20% ostial to proximal LAD disease, 20% mid LAD disease.  . Cataract    corrective surgery done  . Fatty liver   . Gallstones   . GERD (gastroesophageal reflux disease)   . Heart palpitations   . Hepatitis    hepatitis A; past hx.,many yrs ago ? food source  . Hernia, hiatal   . Hyperlipidemia   . Hypertension   . Obesity   . Sleep apnea    CPAP    Past Surgical History:  Procedure Laterality Date  . APPENDECTOMY  2004  . CATARACT EXTRACTION W/ INTRAOCULAR LENS IMPLANT     both eyes  . CHOLECYSTECTOMY    . LEFT HEART CATH AND CORONARY ANGIOGRAPHY N/A 03/19/2018   Procedure: LEFT HEART CATH AND CORONARY ANGIOGRAPHY;  Surgeon: Kathleene Hazel, MD;  Location: [facility] INVASIVE [facility];  Service: Cardiovascular;  Laterality: N/A;  . NM MYOCAR PERF WALL MOTION  08/15/04   No ischemia  . RETINAL DETACHMENT SURGERY Bilateral    remains with slight hazy vision with lights  . TOTAL HIP ARTHROPLASTY Right 08/04/2014    Procedure: RIGHT TOTAL HIP ARTHROPLASTY ANTERIOR APPROACH;  Surgeon: Loanne Drilling, MD;  Location: [facility];  Service: Orthopedics;  Laterality: Right;  . TOTAL HIP ARTHROPLASTY Left 01/05/2015   Procedure: LEFT TOTAL HIP ARTHROPLASTY ANTERIOR APPROACH;  Surgeon: Loanne Drilling, MD;  Location: [facility];  Service: Orthopedics;  Laterality: Left;  . US ECHOCARDIOGRAPHY  06/25/2012   Moderate ASH,LV hyperdynamic,LA is mod. dilated,trace MR,TR,AI    ROS:    As stated in the HPI and negative for all other systems.  PHYSICAL EXAM BP 134/72   Pulse 67   Ht 5\' 8"  (1.727 m)   Wt 295 lb (133.8 kg)   BMI 44.85 kg/m   GENERAL:  Well appearing NECK:  No jugular venous distention, waveform within normal limits, carotid upstroke brisk and symmetric, no bruits, no thyromegaly LUNGS:  Clear to auscultation bilaterally CHEST:  Unremarkable HEART:  PMI not displaced or sustained,S1 and S2 within normal limits, no S3, no S4, no clicks, no rubs, 3 out of 6 apical systolic murmur radiating out the outflow tract up to the carotids, no diastolic murmurs ABD:  Flat, positive bowel sounds normal in frequency in pitch, no bruits, no rebound, no guarding, no midline pulsatile mass, no hepatomegaly, no splenomegaly EXT:  2 plus pulses throughout, mild edema, no cyanosis no clubbing    Lab Results  Component Value Date   CHOL 173 03/17/2018   TRIG 184 (H) 03/17/2018   HDL 34 (L) 03/17/2018   LDLCALC 102 (H) 03/17/2018   EKG: NA  ASSESSMENT AND PLAN  CAD:  The patient has no new sypmtoms.  No further cardiovascular testing is indicated.  We will continue with aggressive risk reduction and meds as listed.  Hypertrophic cardiomyopathy - She did not have suggestion of this on the last echo.  There is moderate valvular stenosis which I will follow up with echoes in the future but she is not having any symptoms.  This was confirmed on cath.  No change in therapy is indicated.   AS - There was mod AS on echo last  year.   Dyslipidemia - She is not at target but does not tolerate higher dose statin.    Hypertension  The blood pressure is at target.  No change in therapy.   Hospital records reviewed for this appt.

## 2018-05-01 ENCOUNTER — Encounter: Payer: Self-pay | Admitting: Cardiology

## 2018-05-01 ENCOUNTER — Ambulatory Visit: Payer: Medicare Other | Admitting: Cardiology

## 2018-05-01 VITALS — BP 134/72 | HR 67 | Ht 68.0 in | Wt 295.0 lb

## 2018-05-01 DIAGNOSIS — I10 Essential (primary) hypertension: Secondary | ICD-10-CM | POA: Diagnosis not present

## 2018-05-01 DIAGNOSIS — I350 Nonrheumatic aortic (valve) stenosis: Secondary | ICD-10-CM | POA: Diagnosis not present

## 2018-05-01 DIAGNOSIS — E785 Hyperlipidemia, unspecified: Secondary | ICD-10-CM

## 2018-05-01 DIAGNOSIS — I25110 Atherosclerotic heart disease of native coronary artery with unstable angina pectoris: Secondary | ICD-10-CM | POA: Diagnosis not present

## 2018-05-01 NOTE — Patient Instructions (Signed)

## 2018-05-12 ENCOUNTER — Other Ambulatory Visit: Payer: Self-pay | Admitting: Cardiology

## 2018-05-14 ENCOUNTER — Other Ambulatory Visit: Payer: Self-pay | Admitting: Cardiology

## 2018-05-22 ENCOUNTER — Telehealth: Payer: Self-pay | Admitting: *Deleted

## 2018-05-22 NOTE — Telephone Encounter (Signed)
Faxed CPAP supply order for 05/21/18 to [facility].

## 2018-07-09 ENCOUNTER — Other Ambulatory Visit: Payer: Self-pay | Admitting: Cardiology

## 2018-07-09 NOTE — Telephone Encounter (Signed)
Rx sent to pharmacy   

## 2018-07-28 ENCOUNTER — Telehealth: Payer: Self-pay | Admitting: Emergency Medicine

## 2018-07-28 NOTE — Telephone Encounter (Signed)
Pt will keep 11/05/18 appointment and have her PCP handle for right now, I offered her an appointment in October she declined. The appointment is only 15 minutes for that day and I did try and make adjustments but patient refused and said she would make another appointment if needed because she thinks her PCP can just handle all of this.

## 2018-07-28 NOTE — Telephone Encounter (Signed)
Please advise on below  

## 2018-07-28 NOTE — Telephone Encounter (Signed)
Thank you so much, Toni Amend!

## 2018-07-28 NOTE — Telephone Encounter (Signed)
I do not have a recent hemoglobin A1c...  Also, he is she taking any medications for her diabetes?  I have not seen her for this problem before.  I would suggest the PCP at least start her on medication for her diabetes (metformin?) and I can follow-up with her.  Alternatively, I can see her but I need to put her on an initial visit slot, or at least get 30 minutes for her as this is a new problem for me.

## 2018-07-28 NOTE — Telephone Encounter (Signed)
Pt called and stated she needs to be sooner than her December appt per PCP. She wants her to be seen sooner because of her A1C. Would you like her to be worked in somewhere? Please advise thanks.

## 2018-10-07 ENCOUNTER — Other Ambulatory Visit: Payer: Self-pay | Admitting: Cardiology

## 2018-11-05 ENCOUNTER — Encounter: Payer: Self-pay | Admitting: Internal Medicine

## 2018-11-05 ENCOUNTER — Ambulatory Visit (INDEPENDENT_AMBULATORY_CARE_PROVIDER_SITE_OTHER): Payer: Medicare Other | Admitting: Internal Medicine

## 2018-11-05 VITALS — BP 120/70 | HR 65 | Ht 68.0 in | Wt 294.0 lb

## 2018-11-05 DIAGNOSIS — E278 Other specified disorders of adrenal gland: Secondary | ICD-10-CM | POA: Diagnosis not present

## 2018-11-05 LAB — POCT GLYCOSYLATED HEMOGLOBIN (HGB A1C): Hemoglobin A1C: 6.1 % — AB (ref 4.0–5.6)

## 2018-11-05 LAB — POTASSIUM: Potassium: 4.3 mEq/L (ref 3.5–5.1)

## 2018-11-05 NOTE — Patient Instructions (Addendum)
Please perform a 24 h urine collection for cortisol.  Please stop at the lab.  Please start checking sugars every day - every other day.  Targets for blood sugars: - fasting: 80-130 - 2h after meals: <180  Please consider the following ways to cut down carbs and fat and increase fiber and micronutrients in your diet: - substitute whole grain for white bread or pasta - substitute brown rice for white rice - substitute 90-calorie flat bread pieces for slices of bread when possible - substitute sweet potatoes or yams for white potatoes - substitute humus for margarine - substitute tofu for cheese when possible - substitute almond or rice milk for regular milk (would not drink soy milk daily due to concern for soy estrogen influence on breast cancer risk) - substitute dark chocolate for other sweets when possible - substitute water - can add lemon or orange slices for taste - for diet sodas (artificial sweeteners will trick your body that you can eat sweets without getting calories and will lead you to overeating and weight gain in the long run) - do not skip breakfast or other meals (this will slow down the metabolism and will result in more weight gain over time)  - can try smoothies made from fruit and almond/rice milk in am instead of regular breakfast - can also try old-fashioned (not instant) oatmeal made with almond/rice milk in am - order the dressing on the side when eating salad at a restaurant (pour less than half of the dressing on the salad) - eat as little meat as possible - can try juicing, but should not forget that juicing will get rid of the fiber, so would alternate with eating raw veg./fruits or drinking smoothies - use as little oil as possible, even when using olive oil - can dress a salad with a mix of balsamic vinegar and lemon juice, for e.g. - use agave nectar, stevia sugar, or regular sugar rather than artificial sweateners - steam or broil/roast veggies  - snack  on veggies/fruit/nuts (unsalted, preferably) when possible, rather than processed foods - reduce or eliminate aspartame in diet (it is in diet sodas, chewing gum, etc) Read the labels!  Try to read Dr. Katherina Right book: "Program for Reversing Diabetes" for other ideas for healthy eating.

## 2018-11-05 NOTE — Progress Notes (Addendum)
Patient ID: Victoria Delgado, female   DOB: 08/12/1941, 77 y.o.   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  PCP would also want me to address her prediabetes.  Adrenal incidentaloma: Patient's adrenal mass was incidentally found during investigation for hemorrhagic cystitis.  She has a long standing history of back pain.  I reviewed patient's previous imaging test report and images: Abdominal CT (06/22/2017): 2.5 cm R adrenal nodule, 15 HU, not present in 2004; likely adenoma. Abdominal MRI (07/24/2017): hypointense nodule with homogeneous T2 signal and signal dropout consistent with benign adenoma  Adrenal labs were normal: Component     Latest Ref Rng & Units 11/05/2017 11/08/2017  Epinephrine     pg/mL see note   Norepinephrine     pg/mL 471   Dopamine     pg/mL see note   Catecholamines, Total     pg/mL 471   Aldosterone, Serum      ng/dL 4   Renin Activity     0.25 - 5.82 ng/mL/h 0.88   ALDO / PRA Ratio     0.9 - 28.9 Ratio 4.5   Metanephrine, Pl     <=57 pg/mL 27   Normetanephrine, Pl     <=148 pg/mL 134   Total Metanephrines-Plasma     <=205 pg/mL 161   24 Hour urine volume (VMAHVA)     mL  2,250  Cortisol (Ur), Free     4.0 - 50.0 mcg/24 h  14.4  Results received     0.50 - 2.15 g/24 h  1.46  Potassium     3.5 - 5.1 mEq/L 4.5    She also has a history of HTN.  She is on metoprolol, Cardizem, furosemide.  Blood pressure at home is at goal.  She is on potassium 10 mEq daily.  Pt. also has a history of prediabetes developed after she started statins in the past.  At last visit, she was telling me that latest HbA1c was 6.3%.  She had a steroid inj for bursitis ~2008, not since.  Steroids caused her A. fib.  She also has a h/o AMI - s/p 2 stents  - 04/2016. She had unstable angina >> cath in 03/2018.  She had a history of 3 episodes of retinal detachment.  No CKD: Lab Results   Component Value Date   BUN 14 03/19/2018   Lab Results  Component Value Date   CREATININE 0.77 03/19/2018   + HL: Lab Results  Component Value Date   CHOL 173 03/17/2018   HDL 34 (L) 03/17/2018   LDLCALC 102 (H) 03/17/2018   TRIG 184 (H) 03/17/2018   CHOLHDL 5.1 03/17/2018  On Crestor 10 mg 3-4x a week >> leg cramps. Ankles also hurting.  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  ROS: Constitutional: + Weight gain/no weight loss, + fatigue, no subjective hyperthermia, no subjective hypothermia Eyes: no blurry vision, no xerophthalmia ENT: no sore throat, no nodules palpated in neck, no dysphagia, no odynophagia, no hoarseness Cardiovascular: + CP/+ SOB/+ palpitations/no leg swelling Respiratory: no cough/+ SOB/no wheezing Gastrointestinal: no N/no V/no D/no C/no acid reflux Musculoskeletal: + muscle aches/+ joint aches Skin: no rashes, no hair loss Neurological: no tremors/no numbness/no tingling/no dizziness  I reviewed pt's medications, allergies, PMH, social hx, family hx, and changes were documented in the history of present illness. Otherwise, unchanged from my initial visit note.  Past Medical History:  Diagnosis Date  . Arthritis    osteoarthritis-hips,knees, back, hands  . Asymmetric septal hypertrophy (HCC)   . Atrial fibrillation (HCC)   . CAD (coronary artery disease)    2 drug-eluting stents placed in the circumflex leading into a marginal.  May 2017.  [name].   catheterization on 03/19/2018, this showed patent stent in the proximal to mid left circumflex artery, 20% ostial left circumflex artery disease, 40% OM 3 disease, widely patent stent in the ostial D1, 20% ostial to proximal LAD disease, 20% mid LAD disease.  . Cataract    corrective surgery done  . Fatty liver   . Gallstones   . GERD (gastroesophageal reflux disease)   . Heart palpitations   . Hepatitis    hepatitis A; past hx.,many yrs ago ? food source  . Hernia, hiatal   . Hyperlipidemia   .  Hypertension   . Obesity   . Sleep apnea    CPAP   Past Surgical History:  Procedure Laterality Date  . APPENDECTOMY  2004  . CATARACT EXTRACTION W/ INTRAOCULAR LENS IMPLANT     both eyes  . CHOLECYSTECTOMY    . LEFT HEART CATH AND CORONARY ANGIOGRAPHY N/A 03/19/2018   Procedure: LEFT HEART CATH AND CORONARY ANGIOGRAPHY;  Surgeon: Kathleene Hazel, MD;  Location: [facility];  Service: Cardiovascular;  Laterality: N/A;  . NM MYOCAR PERF WALL MOTION  08/15/04   No ischemia  . RETINAL DETACHMENT SURGERY Bilateral    remains with slight hazy vision with lights  . TOTAL HIP ARTHROPLASTY Right 08/04/2014   Procedure: RIGHT TOTAL HIP ARTHROPLASTY ANTERIOR APPROACH;  Surgeon: Loanne Drilling, MD;  Location: [facility];  Service: Orthopedics;  Laterality: Right;  . TOTAL HIP ARTHROPLASTY Left 01/05/2015   Procedure: LEFT TOTAL HIP ARTHROPLASTY ANTERIOR APPROACH;  Surgeon: Loanne Drilling, MD;  Location: [facility];  Service: Orthopedics;  Laterality: Left;  . US ECHOCARDIOGRAPHY  06/25/2012   Moderate ASH,LV hyperdynamic,LA is mod. dilated,trace MR,TR,AI   Social History   Socioeconomic History  . Marital status: Married    Spouse name: Not on file  . Number of children: 2  . Years of education: Not on file  . Highest education level: Not on file  Occupational History    Employer: OTHER  Social Needs  . Financial resource strain: Not on file  . Food insecurity:    Worry: Not on file    Inability: Not on file  . Transportation needs:    Medical: Not on file    Non-medical: Not on file  Tobacco Use  . Smoking status: Former Games developer  . Smokeless tobacco: Never Used  . Tobacco comment: QUIT IN 1990  Substance and Sexual Activity  . Alcohol use: No    Alcohol/week: 0.0 standard drinks  . Drug use: No  . Sexual activity: Yes  Lifestyle  . Physical activity:    Days per week: Not on file    Minutes per session: Not on file  . Stress: Not on file  Relationships  . Social  connections:    Talks on phone: Not on file    Gets together: Not on file    Attends religious service: Not on file    Active member of club or organization: Not on file    Attends meetings of clubs or organizations: Not on file    Relationship status: Not on file  . Intimate partner violence:    Fear of current or ex partner: Not  on file    Emotionally abused: Not on file    Physically abused: Not on file    Forced sexual activity: Not on file  Other Topics Concern  . Not on file  Social History Narrative   Two grandchildren.            Current Outpatient Medications on File Prior to Visit  Medication Sig Dispense Refill  . aspirin EC 81 MG tablet Take 81 mg by mouth daily.    . Cal Carb-Mag Hydrox-Simeth (ROLAIDS ADVANCED PO) Take 2 tablets by mouth as needed (indigestion).    . CARTIA XT 240 MG 24 hr capsule TAKE 1 CAPSULE(240 MG) BY MOUTH AT BEDTIME 90 capsule 3  . Cholecalciferol (VITAMIN D) 2000 units CAPS Take 1 capsule by mouth daily.    Marland Kitchen CINNAMON PO Take 1,000 mg by mouth daily.    . CRESTOR 10 MG tablet Take 1 tablet (10 mg total) as directed by mouth. (Patient taking differently: Take 10 mg by mouth as directed. Taking every other day) 90 tablet 3  . furosemide (LASIX) 40 MG tablet TAKE 1 TABLET BY MOUTH EVERY DAY WITH POTASSIUM PILL 90 tablet 0  . metoprolol succinate (TOPROL XL) 50 MG 24 hr tablet TAKE 1 TABLET BY MOUTH EVERY MORNING AND 1/2 TABLET EVERY EVENING 135 tablet 0  . nitroGLYCERIN (NITROSTAT) 0.4 MG SL tablet Place 1 tablet (0.4 mg total) under the tongue every 5 (five) minutes as needed for chest pain. 25 tablet 3  . potassium chloride (K-DUR,KLOR-CON) 10 MEQ tablet Take 10 mEq by mouth daily.     Marland Kitchen Propylene Glycol (SYSTANE BALANCE OP) Place 1 drop into both eyes daily as needed (DRY EYES).    . ranitidine (ZANTAC) 150 MG tablet Take 150 mg by mouth 2 (two) times daily.     No current facility-administered medications on file prior to visit.     Allergies  Allergen Reactions  . Other Other (See Comments)    Gas bubble C3F8 has green bracelet **  . Atorvastatin Other (See Comments)    myalgias  . Codeine Nausea Only  . Kenalog [Triamcinolone Acetonide] Other (See Comments)    Had A.fib after she received the injection."Steroid meds"  . Relafen [Nabumetone] Other (See Comments)    Edema   . Metoprolol Other (See Comments)    Generic metoprolol XL doesn't work for her after a few days. She takes brand name only Toprol XL. (per Cardiologist and confirmed with [facility])  . Penicillins Rash    40 years ago, injection site was red and swollen.   Has patient had a PCN reaction causing immediate rash, facial/tongue/throat swelling, SOB or lightheadedness with hypotension: YES Has patient had a PCN reaction causing severe rash involving mucus membranes or skin necrosis: NO Has patient had a PCN reaction that required hospitalization. No  Has patient had a PCN reaction occurring within the last 10 years: NO If all of the above answers are "NO", then may proceed with Cephalosporin use.    . Tramadol Other (See Comments)    Auditory halllucinations   Family History  Problem Relation Age of Onset  . Hypertension Mother   . CVA Mother   . Lung cancer Father 64       asbestos exposure  . Esophageal cancer Other        nephew  . Diabetes Other        nephew  . Colon cancer Neg Hx   . Pancreatic cancer Neg  Hx   . Kidney disease Neg Hx   . Liver disease Neg Hx    PE: BP 120/70   Pulse 65   Ht 5\' 8"  (1.727 m) Comment: measured  Wt 294 lb (133.4 kg)   SpO2 96%   BMI 44.70 kg/m  Wt Readings from Last 3 Encounters:  11/05/18 294 lb (133.4 kg)  05/01/18 295 lb (133.8 kg)  03/19/18 288 lb 1.6 oz (130.7 kg)   Constitutional: overweight, in NAD Eyes: PERRLA, EOMI, no exophthalmos ENT: moist mucous membranes, no thyromegaly, no cervical lymphadenopathy Cardiovascular: RRR, No RG, +2/6 SEM Respiratory: CTA  B Gastrointestinal: abdomen soft, NT, ND, BS+ Musculoskeletal: no deformities, strength intact in all 4 Skin: moist, warm, no rashes Neurological: no tremor with outstretched hands, DTR normal in all 4  ASSESSMENT: 1. R Adrenal incidentaloma  2. Obesity class 3  3.  Prediabetes  PLAN:  1. Patient with a right adrenal nodule discovered incidentally -We again discussed that there are 3 possible scenarios -A nonfunctioning adrenal nodule -A functioning adrenal adenoma- which can hypersecrete catecholamines/metanephrines, cortisol, or aldosterone -Adrenal cancer or metastases. We do not have specific blood tests to check for cancer, but the best indicator is lack of change in size and appearance of the adrenal mass over time.  The nodule did not appear to be present on the CT abdomen from 2004, however, per my review of the images, it is possible that the right adrenal was not well visualized at that time to rule out the nodule. -At last visit we checked her adrenal labs and these were normal: -She refused a dexamethasone suppression test as she was telling me that she was very sensitive to exogenous steroids.  A 24-hour urine cortisol was performed to rule out Cushing syndrome (6% of adrenal incidentalomas).  This was normal. -We checked fractionated metanephrines and catecholamines to rule out pheochromocytoma (3% of adrenal incidentalomas) and these were normal -We checked a plasma renin activity and aldosterone level to rule out primary hyperaldosteronism (0.6% of adrenal incidentalomas) and these were normal. -We discussed that we will need to repeat the tests for total of 5 years to ensure nodule nonfunctionality.  We will repeat these tests at today's visit. -She had imaging of her adrenal in 2018 and at today's visit we we will order another CT scan, this time, focused on the adrenals.  I will order this with and without contrast.  If this shows no change in her adrenal mass, no further  imaging is necessary.  Orders Placed This Encounter  Procedures  . Creatinine, urine, 24 hour  . Metanephrines, plasma  . Catecholamines, fractionated, plasma  . Aldosterone + renin activity w/ ratio  . Potassium  . Cortisol,free,24 hour urine w/creatinine   2. Obesity class 3 -At last visit, I gave her references for a plant-based diet.  I explained the benefits.  I again reiterated this today.  She is willing to try. -Weight is higher by 6 pounds at this visit  3.  Prediabetes - new pb for me -We checked her HbA1c today and this is better, at 6.1% -We discussed the prediabetes is a reversible disease provided that she is controlling her diet by limiting fatty foods and concentrated carbs and eats a lot of plant-based foods to reduce her insulin resistance.  I also encouraged her to continue swimming, which she started to do.  She has joint pain so cannot perform more weightbearing exercise. -I also advised her to start checking sugars every day  or every other day and let me know if they increase -Otherwise, I will follow-up with her in 6 months.  Component     Latest Ref Rng & Units 11/07/2018  24 Hour urine volume (VMAHVA)     mL 2,150  Cortisol (Ur), Free     4.0 - 50.0 mcg/24 h 12.0  Results received     0.50 - 2.15 g/24 h 1.51  Extra Urine Specimen        Creatinine, 24H Ur     0.50 - 2.15 g/24 h 1.51  24h UFC normal.  Component     Latest Ref Rng & Units 11/05/2018  Epinephrine     pg/mL 27  Norepinephrine     pg/mL 842  Dopamine     pg/mL see note  Catecholamines, Total     pg/mL 869  Metanephrine, Pl     <=57 pg/mL <25  Normetanephrine, Pl     <=148 pg/mL 183 (H)  Total Metanephrines-Plasma     <=205 pg/mL 183  Potassium     3.5 - 5.1 mEq/L 4.3  Minimally elevated normetanephrine level.  This is nonspecific.    Component     Latest Ref Rng & Units 11/05/2018  ALDOSTERONE      ng/dL 10  Renin Activity     0.25 - 5.82 ng/mL/h 1.24  ALDO / PRA Ratio      0.9 - 28.9 Ratio 8.1  Normal Aldo/PRA.  Carlus Pavlov, MD PhD [facility] Endocrinology

## 2018-11-05 NOTE — Addendum Note (Signed)
Addended by: Darliss Ridgel I on: 11/05/2018 01:38 PM   Modules accepted: Orders

## 2018-11-07 ENCOUNTER — Other Ambulatory Visit: Payer: Medicare Other

## 2018-11-07 DIAGNOSIS — E278 Other specified disorders of adrenal gland: Secondary | ICD-10-CM

## 2018-11-09 NOTE — Progress Notes (Signed)
HPI The patient presents for follow up of CAD.  She has atrial fibrillation which she ultimately thinks was related to a steroid injection. This was in 2008. At that time she was found to have asymmetric septal hypertrophy. This has been followed conservatively.  Echocardiogram did demonstrate concentric LV hypertrophy which was moderate and moderate LAE.  In May of 2017 she was in the hospital with CAD.  She was admitted to [facility] on 5/24 after waking up with left arm pain.  She had positive troponin with  PCI to the mid LCX and 1st DX. She is on DAPT with aspirin/Effient. She had 2 drug-eluting stents placed in the circumflex leading into a marginal. This was apparently somewhat small circumflex. She also had a diagonal had a drug-eluting stent placed.  An echo in our office showed well preserved ejection fraction. There was some moderate aortic stenosis. There was no mention of hypertrophic obstruction although there is LVH.  She was in the hospital in April with chest pain.    She did have positive cardiac enzymes.   She underwent cardiac catheterization on 03/19/2018, this showed patent stent in the proximal to mid left circumflex artery, 20% ostial left circumflex artery disease, 40% OM 3 disease, widely patent stent in the ostial D1, 20% ostial to proximal LAD disease, 20% mid LAD disease.  Cardiac catheterization did confirm moderate aortic stenosis with mean gradient of 26.7 mmHg.  Medical therapy was recommended for her coronary artery disease.    Since I last saw her she is been doing relatively well.  She is doing water aerobics 3 times a week.  She does get a little short of breath doing activities like this or making the bed but this is not new.  She is fatigued but it turns out she only sleeps 5 hours a night or so.  She does use her CPAP.  She is not having any chest pressure, neck or arm discomfort.  She is not noticing any new palpitations, presyncope or syncope.  She has no PND or  orthopnea.  Allergies  Allergen Reactions  . Other Other (See Comments)    Gas bubble C3F8 has green bracelet **  . Atorvastatin Other (See Comments)    myalgias  . Codeine Nausea Only  . Kenalog [Triamcinolone Acetonide] Other (See Comments)    Had A.fib after she received the injection."Steroid meds"  . Relafen [Nabumetone] Other (See Comments)    Edema   . Metoprolol Other (See Comments)    Generic metoprolol XL doesn't work for her after a few days. She takes brand name only Toprol XL. (per Cardiologist and confirmed with [facility])  . Penicillins Rash    40 years ago, injection site was red and swollen.   Has patient had a PCN reaction causing immediate rash, facial/tongue/throat swelling, SOB or lightheadedness with hypotension: YES Has patient had a PCN reaction causing severe rash involving mucus membranes or skin necrosis: NO Has patient had a PCN reaction that required hospitalization. No  Has patient had a PCN reaction occurring within the last 10 years: NO If all of the above answers are "NO", then may proceed with Cephalosporin use.    . Tramadol Other (See Comments)    Auditory halllucinations    Current Outpatient Medications  Medication Sig Dispense Refill  . aspirin EC 81 MG tablet Take 81 mg by mouth daily.    . Cal Carb-Mag Hydrox-Simeth (ROLAIDS ADVANCED PO) Take 2 tablets by mouth as needed (  indigestion).    . CARTIA XT 240 MG 24 hr capsule TAKE 1 CAPSULE(240 MG) BY MOUTH AT BEDTIME 90 capsule 3  . Cholecalciferol (VITAMIN D) 2000 units CAPS Take 1 capsule by mouth daily.    . CRESTOR 10 MG tablet Take 1 tablet (10 mg total) as directed by mouth. (Patient taking differently: Take 10 mg by mouth as directed. Taking every other day) 90 tablet 3  . furosemide (LASIX) 40 MG tablet TAKE 1 TABLET BY MOUTH EVERY DAY WITH POTASSIUM PILL 90 tablet 0  . metoprolol succinate (TOPROL XL) 50 MG 24 hr tablet TAKE 1 TABLET BY MOUTH EVERY MORNING AND 1/2 TABLET  EVERY EVENING 135 tablet 0  . nitroGLYCERIN (NITROSTAT) 0.4 MG SL tablet Place 1 tablet (0.4 mg total) under the tongue every 5 (five) minutes as needed for chest pain. 25 tablet 3  . potassium chloride (K-DUR,KLOR-CON) 10 MEQ tablet Take 10 mEq by mouth daily.     Marland Kitchen Propylene Glycol (SYSTANE BALANCE OP) Place 1 drop into both eyes daily as needed (DRY EYES).     No current facility-administered medications for this visit.     Past Medical History:  Diagnosis Date  . Arthritis    osteoarthritis-hips,knees, back, hands  . Asymmetric septal hypertrophy (HCC)   . Atrial fibrillation (HCC)   . CAD (coronary artery disease)    2 drug-eluting stents placed in the circumflex leading into a marginal.  May 2017.  [name].   catheterization on 03/19/2018, this showed patent stent in the proximal to mid left circumflex artery, 20% ostial left circumflex artery disease, 40% OM 3 disease, widely patent stent in the ostial D1, 20% ostial to proximal LAD disease, 20% mid LAD disease.  . Cataract    corrective surgery done  . Fatty liver   . Gallstones   . GERD (gastroesophageal reflux disease)   . Heart palpitations   . Hepatitis    hepatitis A; past hx.,many yrs ago ? food source  . Hernia, hiatal   . Hyperlipidemia   . Hypertension   . Obesity   . Sleep apnea    CPAP    Past Surgical History:  Procedure Laterality Date  . APPENDECTOMY  2004  . CATARACT EXTRACTION W/ INTRAOCULAR LENS IMPLANT     both eyes  . CHOLECYSTECTOMY    . LEFT HEART CATH AND CORONARY ANGIOGRAPHY N/A 03/19/2018   Procedure: LEFT HEART CATH AND CORONARY ANGIOGRAPHY;  Surgeon: Kathleene Hazel, MD;  Location: [facility];  Service: Cardiovascular;  Laterality: N/A;  . NM MYOCAR PERF WALL MOTION  08/15/04   No ischemia  . RETINAL DETACHMENT SURGERY Bilateral    remains with slight hazy vision with lights  . TOTAL HIP ARTHROPLASTY Right 08/04/2014   Procedure: RIGHT TOTAL HIP ARTHROPLASTY ANTERIOR  APPROACH;  Surgeon: Loanne Drilling, MD;  Location: [facility];  Service: Orthopedics;  Laterality: Right;  . TOTAL HIP ARTHROPLASTY Left 01/05/2015   Procedure: LEFT TOTAL HIP ARTHROPLASTY ANTERIOR APPROACH;  Surgeon: Loanne Drilling, MD;  Location: [facility];  Service: Orthopedics;  Laterality: Left;  . US ECHOCARDIOGRAPHY  06/25/2012   Moderate ASH,LV hyperdynamic,LA is mod. dilated,trace MR,TR,AI    ROS:    As stated in the HPI and negative for all other systems.  PHYSICAL EXAM BP 126/68   Pulse (!) 58   Ht 5\' 8"  (1.727 m)   Wt 293 lb 6.4 oz (133.1 kg)   BMI 44.61 kg/m   GENERAL:  Well  appearing NECK:  No jugular venous distention, waveform within normal limits, carotid upstroke brisk and symmetric, no bruits, no thyromegaly LUNGS:  Clear to auscultation bilaterally CHEST:  Unremarkable HEART:  PMI not displaced or sustained,S1 and S2 within normal limits, no S3, no S4, no clicks, no rubs, 3/6 systolic murmur heard best at there right upper sternal border, no diastolic murmurs ABD:  Flat, positive bowel sounds normal in frequency in pitch, no bruits, no rebound, no guarding, no midline pulsatile mass, no hepatomegaly, no splenomegaly EXT:  2 plus pulses throughout, no edema, no cyanosis no clubbing    Lab Results  Component Value Date   CHOL 173 03/17/2018   TRIG 184 (H) 03/17/2018   HDL 34 (L) 03/17/2018   LDLCALC 102 (H) 03/17/2018    EKG:   Sinus rhythm, rate 58, left axis deviation, left anterior fascicular block, poor anterior R wave progression, no acute ST-T wave changes.   11/13/2018   ASSESSMENT AND PLAN   CAD:  The patient has no new sypmtoms.  No further cardiovascular testing is indicated.  We will continue with aggressive risk reduction and meds as listed.  Hypertrophic cardiomyopathy - She had none of this on the previous echo but this will be evaluated as above.  AS - This has been moderate.  I will check an echo in April which will be about 18 months from the  last one.     Dyslipidemia - She is not at target with her lipids.  I will check a lipid profile today.  She might consider a PCSK9 inhibitor if she is not at target.  She actually reduced her dose of statin because she just does not tolerate this.  She develops muscle aches.   Hypertension  Her blood pressure is at target.  No change in therapy.  Morbid obesity She had talked about weight loss strategies.

## 2018-11-11 LAB — CREATININE, URINE, 24 HOUR: Creatinine, 24H Ur: 1.51 g/(24.h) (ref 0.50–2.15)

## 2018-11-11 LAB — CORTISOL, URINE, 24 HOUR
24 Hour urine volume (VMAHVA): 2150 mL
Cortisol (Ur), Free: 12 mcg/24 h (ref 4.0–50.0)
RESULTS RECEIVED: 1.51 g/(24.h) (ref 0.50–2.15)

## 2018-11-11 LAB — EXTRA URINE SPECIMEN

## 2018-11-13 ENCOUNTER — Ambulatory Visit: Payer: Medicare Other | Admitting: Cardiology

## 2018-11-13 ENCOUNTER — Encounter: Payer: Self-pay | Admitting: Cardiology

## 2018-11-13 VITALS — BP 126/68 | HR 58 | Ht 68.0 in | Wt 293.4 lb

## 2018-11-13 DIAGNOSIS — I350 Nonrheumatic aortic (valve) stenosis: Secondary | ICD-10-CM

## 2018-11-13 DIAGNOSIS — E782 Mixed hyperlipidemia: Secondary | ICD-10-CM | POA: Diagnosis not present

## 2018-11-13 DIAGNOSIS — Z79899 Other long term (current) drug therapy: Secondary | ICD-10-CM

## 2018-11-13 NOTE — Patient Instructions (Signed)
Medication Instructions:  Continue current medications  If you need a refill on your cardiac medications before your next appointment, please call your pharmacy.  Labwork: Fasting Lipids and Liver  If you have labs (blood work) drawn today and your tests are completely normal, you will receive your results only by MyChart Message (if you have MyChart) -OR- A paper copy in the mail.  If you have any lab test that is abnormal or we need to change your treatment, we will call you to review these results.  Testing/Procedures: Your physician has requested that you have an echocardiogram in April 2020. Echocardiography is a painless test that uses sound waves to create images of your heart. It provides your doctor with information about the size and shape of your heart and how well your heart's chambers and valves are working. This procedure takes approximately one hour. There are no restrictions for this procedure.  Follow-Up: You will need a follow up appointment in 6 months.  Please call our office 2 months in advance to schedule this appointment.  You may see Rollene Rotunda, MD or one of the following Advanced Practice Providers on your designated Care Team:   Theodore Demark, PA-C . Joni Reining, DNP, ANP   At [facility], you and your health needs are our priority.  As part of our continuing mission to provide you with exceptional heart care, we have created designated Provider Care Teams.  These Care Teams include your primary Cardiologist (physician) and Advanced Practice Providers (APPs -  Physician Assistants and Nurse Practitioners) who all work together to provide you with the care you need, when you need it.

## 2018-11-14 LAB — LIPID PANEL
CHOLESTEROL TOTAL: 172 mg/dL (ref 100–199)
Chol/HDL Ratio: 4.9 ratio — ABNORMAL HIGH (ref 0.0–4.4)
HDL: 35 mg/dL — ABNORMAL LOW (ref >39)
LDL Calculated: 100 mg/dL — ABNORMAL HIGH (ref 0–99)
Triglycerides: 184 mg/dL — ABNORMAL HIGH (ref 0–149)
VLDL Cholesterol Cal: 37 mg/dL (ref 5–40)

## 2018-11-14 LAB — HEPATIC FUNCTION PANEL
ALBUMIN: 4.2 g/dL (ref 3.5–4.8)
ALK PHOS: 81 IU/L (ref 39–117)
ALT: 18 IU/L (ref 0–32)
AST: 19 IU/L (ref 0–40)
BILIRUBIN TOTAL: 0.4 mg/dL (ref 0.0–1.2)
BILIRUBIN, DIRECT: 0.11 mg/dL (ref 0.00–0.40)
TOTAL PROTEIN: 6.7 g/dL (ref 6.0–8.5)

## 2018-11-18 LAB — CATECHOLAMINES, FRACTIONATED, PLASMA
CATECHOLAMINES, TOTAL: 869 pg/mL
Epinephrine: 27 pg/mL
Norepinephrine: 842 pg/mL

## 2018-11-18 LAB — METANEPHRINES, PLASMA
NORMETANEPHRINE FREE: 183 pg/mL — AB (ref <=148)
Total Metanephrines-Plasma: 183 pg/mL (ref <=205)

## 2018-11-18 LAB — ALDOSTERONE + RENIN ACTIVITY W/ RATIO
ALDO / PRA Ratio: 8.1 Ratio (ref 0.9–28.9)
ALDOSTERONE: 10 ng/dL
Renin Activity: 1.24 ng/mL/h (ref 0.25–5.82)

## 2019-01-01 ENCOUNTER — Telehealth: Payer: Self-pay | Admitting: Cardiology

## 2019-01-01 NOTE — Telephone Encounter (Signed)
Spoke with pt, Follow up scheduled with lipid clinic. 

## 2019-01-01 NOTE — Telephone Encounter (Signed)
New message      Pt stated that she wants to be seen in lipid clinic but no orders are in .

## 2019-01-05 ENCOUNTER — Other Ambulatory Visit: Payer: Self-pay | Admitting: Cardiology

## 2019-01-13 ENCOUNTER — Other Ambulatory Visit: Payer: Self-pay | Admitting: Cardiology

## 2019-01-15 ENCOUNTER — Ambulatory Visit (INDEPENDENT_AMBULATORY_CARE_PROVIDER_SITE_OTHER): Payer: Medicare Other | Admitting: Pharmacist Clinician (PhC)/ Clinical Pharmacy Specialist

## 2019-01-15 ENCOUNTER — Encounter: Payer: Self-pay | Admitting: Pharmacist Clinician (PhC)/ Clinical Pharmacy Specialist

## 2019-01-15 DIAGNOSIS — E782 Mixed hyperlipidemia: Secondary | ICD-10-CM | POA: Diagnosis not present

## 2019-01-15 MED ORDER — CRESTOR 20 MG PO TABS: 20.0000 mg | ORAL_TABLET | Freq: Every day | ORAL | 3 refills | Status: DC

## 2019-01-15 NOTE — Progress Notes (Signed)
01/15/2019 Victoria Delgado 06/09/1941 [id number]   HPI:  Victoria Delgado is a 78 y.o. female patient of Dr Antoine Poche, who presents today for a lipid clinic evaluation.  In addition to hyperlipidemia, her medical history is significant for atrial fibrillation, CAD (s/p DES x 2 in May 2017), unstable angina, obstructive cardiomyopathy, aortic stenosis, sleep apnea (CPAP?) and morbid obesity  Read about lov vit D and muscle pains with statins, started vit d, now has found that muscle pain/weakness diminished greatly, weakness less than pain;  Does note flatulence, loose stool  arthritis in ankles  Wants brand Crestor to see if does better without pain - both sisters were on brand, wnet generic then got worse  No history of zetia  Current Medications:  Rosuvastatin 10 mg three times weekly  Last 3 weeks been taking daily  Risk Factors:  Cholesterol Goals:   Intolerant/previously tried:  Family history:   Mother CABG died from stroke at 3  Father died at 25 from asbestos exposure  Both sisters with high cholesterol, no CAD at this point  2 children, not tested at this point  Diet:   No fried foods, mostly home cooked, f/v fresh or frozen to avoid canned; does like popcorn;   Exercise:    Started in August [facility] for water aerobics Labs:   11/2018:  TC 172, TG 184, HDL 35, LDL 100 (rosuvastatin 10 mg tiw)  Current Outpatient Medications  Medication Sig Dispense Refill  . aspirin EC 81 MG tablet Take 81 mg by mouth daily.    . Cal Carb-Mag Hydrox-Simeth (ROLAIDS ADVANCED PO) Take 2 tablets by mouth as needed (indigestion).    . CARTIA XT 240 MG 24 hr capsule TAKE 1 CAPSULE(240 MG) BY MOUTH AT BEDTIME 90 capsule 3  . Cholecalciferol (VITAMIN D) 2000 units CAPS Take 1 capsule by mouth daily.    . CRESTOR 20 MG tablet Take 1 tablet (20 mg total) by mouth daily. 90 tablet 3  . furosemide (LASIX) 40 MG tablet TAKE 1 TABLET BY MOUTH EVERY DAY WITH POTASSIUM PILL 90 tablet 1  . metoprolol  succinate (TOPROL XL) 50 MG 24 hr tablet TAKE 1 TABLET BY MOUTH EVERY MORNING AND 1/2 TABLET EVERY EVENING 135 tablet 0  . nitroGLYCERIN (NITROSTAT) 0.4 MG SL tablet Place 1 tablet (0.4 mg total) under the tongue every 5 (five) minutes as needed for chest pain. 25 tablet 3  . potassium chloride (K-DUR,KLOR-CON) 10 MEQ tablet Take 10 mEq by mouth daily.     Marland Kitchen Propylene Glycol (SYSTANE BALANCE OP) Place 1 drop into both eyes daily as needed (DRY EYES).     No current facility-administered medications for this visit.     Allergies  Allergen Reactions  . Other Other (See Comments)    Gas bubble [id number] has green bracelet **  . Atorvastatin Other (See Comments)    myalgias  . Codeine Nausea Only  . Kenalog [Triamcinolone Acetonide] Other (See Comments)    Had A.fib after she received the injection."Steroid meds"  . Relafen [Nabumetone] Other (See Comments)    Edema   . Metoprolol Other (See Comments)    Generic metoprolol XL doesn't work for her after a few days. She takes brand name only Toprol XL. (per Cardiologist and confirmed with [facility])  . Penicillins Rash    40 years ago, injection site was red and swollen.   Has patient had a PCN reaction causing immediate rash, facial/tongue/throat swelling, SOB or lightheadedness with hypotension: YES Has  patient had a PCN reaction causing severe rash involving mucus membranes or skin necrosis: NO Has patient had a PCN reaction that required hospitalization. No  Has patient had a PCN reaction occurring within the last 10 years: NO If all of the above answers are "NO", then may proceed with Cephalosporin use.    . Tramadol Other (See Comments)    Auditory halllucinations    Past Medical History:  Diagnosis Date  . Arthritis    osteoarthritis-hips,knees, back, hands  . Asymmetric septal hypertrophy (HCC)   . Atrial fibrillation (HCC)   . CAD (coronary artery disease)    2 drug-eluting stents placed in the circumflex leading into  a marginal.  May 2017.  [name].   catheterization on 03/19/2018, this showed patent stent in the proximal to mid left circumflex artery, 20% ostial left circumflex artery disease, 40% OM 3 disease, widely patent stent in the ostial D1, 20% ostial to proximal LAD disease, 20% mid LAD disease.  . Cataract    corrective surgery done  . Fatty liver   . Gallstones   . GERD (gastroesophageal reflux disease)   . Heart palpitations   . Hepatitis    hepatitis A; past hx.,many yrs ago ? food source  . Hernia, hiatal   . Hyperlipidemia   . Hypertension   . Obesity   . Sleep apnea    CPAP    There were no vitals taken for this visit.   Mixed hyperlipidemia Patient with history of hyperlipidemia with baseline LDL in the 180 range.  She has done better with her rosuvastatin since getting her vitamin D level up to therapeutic levels.  Her December LDL was 100 on rosuvastatin 10 mg three times weekly.  She increased her dose to daily about 3 weeks ago.  Would like to increase her dose to 20 mg to see if she can reach goal.   Will increase her to 20 mg daily, with the caveat that she can cut back to 10 mg should any myalgias surface.  We will repeat labs in 2 months and consider the addition of ezetimibe should not be to goal.     Phillips Hay PharmD CPP [facility]

## 2019-01-15 NOTE — Assessment & Plan Note (Signed)
Patient with history of hyperlipidemia with baseline LDL in the 180 range.  She has done better with her rosuvastatin since getting her vitamin D level up to therapeutic levels.  Her December LDL was 100 on rosuvastatin 10 mg three times weekly.  She increased her dose to daily about 3 weeks ago.  Would like to increase her dose to 20 mg to see if she can reach goal.   Will increase her to 20 mg daily, with the caveat that she can cut back to 10 mg should any myalgias surface.  We will repeat labs in 2 months and consider the addition of ezetimibe should not be to goal.

## 2019-01-15 NOTE — Patient Instructions (Signed)
Take 1/2 of the 20 mg Crestor for about 10-14 days.  If you're feeling well, increase to 20 mg daily.  We will repeat your labs in early April to see how you're doing.  If we need to, we can add ezetimibe (Zetia) 10 mg once daily.    Call if you have any questions or concerns.  Jaclynn Laumann/Raquel at [phone]   Cholesterol  Cholesterol is a fat. Your body needs a small amount of cholesterol. Cholesterol (plaque) may build up in your blood vessels (arteries). That makes you more likely to have a heart attack or stroke. You cannot feel your cholesterol level. Having a blood test is the only way to find out if your level is high. Keep your test results. Work with your doctor to keep your cholesterol at a good level. What do the results mean?  Total cholesterol is how much cholesterol is in your blood.  LDL is bad cholesterol. This is the type that can build up. Try to have low LDL.  HDL is good cholesterol. It cleans your blood vessels and carries LDL away. Try to have high HDL.  Triglycerides are fat that the body can store or burn for energy. What are good levels of cholesterol?  Total cholesterol below 200.  LDL below 100 is good for people who have health risks. LDL below 70 is good for people who have very high risks.  HDL above 40 is good. It is best to have HDL of 60 or higher.  Triglycerides below 150. How can I lower my cholesterol? Diet Follow your diet program as told by your doctor.  Choose fish, white meat chicken, or [place] that is roasted or baked. Try not to eat red meat, fried foods, sausage, or lunch meats.  Eat lots of fresh fruits and vegetables.  Choose whole grains, beans, pasta, potatoes, and cereals.  Choose olive oil, corn oil, or canola oil. Only use small amounts.  Try not to eat butter, mayonnaise, shortening, or palm kernel oils.  Try not to eat foods with trans fats.  Choose low-fat or nonfat dairy foods. ? Drink skim or nonfat milk. ? Eat  low-fat or nonfat yogurt and cheeses. ? Try not to drink whole milk or cream. ? Try not to eat ice cream, egg yolks, or full-fat cheeses.  Healthy desserts include angel food cake, ginger snaps, animal crackers, hard candy, popsicles, and low-fat or nonfat frozen yogurt. Try not to eat pastries, cakes, pies, and cookies.  Exercise Follow your exercise program as told by your doctor.  Be more active. Try gardening, walking, and taking the stairs.  Ask your doctor about ways that you can be more active. Medicine  Take over-the-counter and prescription medicines only as told by your doctor. This information is not intended to replace advice given to you by your health care provider. Make sure you discuss any questions you have with your health care provider. Document Released: 02/15/2009 Document Revised: 06/20/2016 Document Reviewed: 05/31/2016 Elsevier Interactive Patient Education  2019 [employer].

## 2019-02-07 ENCOUNTER — Other Ambulatory Visit: Payer: Self-pay | Admitting: Cardiology

## 2019-03-03 ENCOUNTER — Telehealth: Payer: Self-pay | Admitting: Pharmacist Clinician (PhC)/ Clinical Pharmacy Specialist

## 2019-03-03 NOTE — Telephone Encounter (Signed)
Spoke with patient.  She is doing well on rosuvastatin 10 mg daily.  Notices occasional myalgias in her legs, but notes that she feels better as long as she takes the Vitamin D as well.    Agreed that due to COVID 19, now is not the best time to go for lipid check.  Will reach out to her in early June to arrange for lipid labs.  Patient voiced understanding.

## 2019-03-05 ENCOUNTER — Telehealth: Payer: Self-pay | Admitting: Cardiovascular Disease

## 2019-03-05 NOTE — Telephone Encounter (Signed)
Patient called and discussed cancelling elective echocardiogram due to concerns for Covid19 and need for social distancing . Patient stable with no urgent symptoms.  TTE scheduled for 4/14 can be rescheduled for 3-6 months   

## 2019-03-17 ENCOUNTER — Other Ambulatory Visit (HOSPITAL_COMMUNITY): Payer: Medicare Other

## 2019-04-05 ENCOUNTER — Other Ambulatory Visit: Payer: Self-pay | Admitting: Cardiology

## 2019-04-13 ENCOUNTER — Telehealth: Payer: Self-pay | Admitting: Cardiovascular Disease

## 2019-04-13 NOTE — Telephone Encounter (Signed)
New message:    Patient calling concering her CPAP and would like for someone to call her back.

## 2019-04-14 NOTE — Telephone Encounter (Signed)
Returned a call to patient. She states that she has already spoken with [facility]. Thanked me for calling.

## 2019-05-05 ENCOUNTER — Other Ambulatory Visit: Payer: Self-pay

## 2019-05-05 ENCOUNTER — Encounter: Payer: Self-pay | Admitting: Internal Medicine

## 2019-05-05 ENCOUNTER — Ambulatory Visit: Payer: Medicare Other | Admitting: Internal Medicine

## 2019-05-05 DIAGNOSIS — E278 Other specified disorders of adrenal gland: Secondary | ICD-10-CM

## 2019-05-05 DIAGNOSIS — R7303 Prediabetes: Secondary | ICD-10-CM | POA: Insufficient documentation

## 2019-05-05 NOTE — Progress Notes (Signed)
Patient ID: Victoria Delgado, female   DOB: 1940/12/27, 78 y.o.   MRN: [id number]   Patient location: Home My location: Office  Referring Provider: Kendrick RanchSchoenhoff, Deborah D, MD  I connected with the patient on 05/05/19 at 11:00 AM EDT by telephone and verified that I am speaking with the correct person.   I discussed the limitations of evaluation and management by telephone and the availability of in person appointments. The patient expressed understanding and agreed to proceed.   Details of the encounter are shown below.  HPI  Victoria PrimeJoan S Kunath is a 78 y.o.-year-old female, returning for follow-up for a R adrenal incidentaloma and prediabetes.  Last visit 6 months ago.  Adrenal incidentaloma Patient's adrenal mass was incidentally found during investigation for hemorrhagic cystitis.  She has a long standing history of back pain.  I reviewed patient's previous imaging test reports: Abdominal CT (06/22/2017): 2.5 cm R adrenal nodule, 15 HU, not present in 2004; likely adenoma. Abdominal MRI (07/24/2017): hypointense nodule with homogeneous T2 signal and signal dropout consistent with benign adenoma  At last visit, we discussed about obtaining an adrenal CT scan but she did not have this yet.  Reviewed previous adrenal investigation: Component     Latest Ref Rng & Units 11/07/2018  24 Hour urine volume (VMAHVA)     mL 2,150  Cortisol (Ur), Free     4.0 - 50.0 mcg/24 h 12.0  Results received     0.50 - 2.15 g/24 h 1.51  Extra Urine Specimen        Creatinine, 24H Ur     0.50 - 2.15 g/24 h 1.51  24h UFC normal.  Component     Latest Ref Rng & Units 11/05/2018  Epinephrine     pg/mL 27  Norepinephrine     pg/mL 842  Dopamine     pg/mL see note  Catecholamines, Total     pg/mL 869  Metanephrine, Pl     <=57 pg/mL <25  Normetanephrine, Pl     <=148 pg/mL 183 (H)  Total Metanephrines-Plasma     <=205 pg/mL 183  Potassium     3.5 - 5.1 mEq/L 4.3  Minimally elevated normetanephrine  level-nonspecific.  Component     Latest Ref Rng & Units 11/05/2018  ALDOSTERONE      ng/dL 10  Renin Activity     0.25 - 5.82 ng/mL/h 1.24  ALDO / PRA Ratio     0.9 - 28.9 Ratio 8.1  Normal aldosterone to PRA ratio.  Previously: Component     Latest Ref Rng & Units 11/05/2017 11/08/2017  Epinephrine     pg/mL see note   Norepinephrine     pg/mL 471   Dopamine     pg/mL see note   Catecholamines, Total     pg/mL 471   Aldosterone, Serum      ng/dL 4   Renin Activity     0.25 - 5.82 ng/mL/h 0.88   ALDO / PRA Ratio     0.9 - 28.9 Ratio 4.5   Metanephrine, Pl     <=57 pg/mL 27   Normetanephrine, Pl     <=148 pg/mL 134   Total Metanephrines-Plasma     <=205 pg/mL 161   24 Hour urine volume (VMAHVA)     mL  2,250  Cortisol (Ur), Free     4.0 - 50.0 mcg/24 h  14.4  Results received     0.50 - 2.15 g/24 h  1.46  Potassium  3.5 - 5.1 mEq/L 4.5    She also has history of hypertension.  She is on metoprolol, Cardizem, furosemide.  Prediabetes: -After starting statins in the past  Reviewed HbA1c levels: Lab Results  Component Value Date   HGBA1C 6.1 (A) 11/05/2018  2019: HbA1c: 6.3%.  She used to check sugars 1x a day, not in last 3 mo:  - am: 130-140 - 2h after b'fast: n/c - lunch: n/c - 2h after lunch: n/c - dinner: n/c - 2h after dinner: n/c - bedtime: n/c  No CKD: Lab Results  Component Value Date   BUN 14 03/19/2018   Lab Results  Component Value Date   CREATININE 0.77 03/19/2018   + HL: Lab Results  Component Value Date   CHOL 172 11/13/2018   HDL 35 (L) 11/13/2018   LDLCALC 100 (H) 11/13/2018   TRIG 184 (H) 11/13/2018   CHOLHDL 4.9 (H) 11/13/2018  On Crestor 10 mg daily but she still has muscle and ankle cramps with this. She feels this is increasing her sugars.  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  Latest eye exam 11/2018: ?DR. She had a history of 3 episodes of retinal detachment.  She had a steroid inj for bursitis ~2008, not  since.  Steroids caused her A. fib.  She also has a h/o AMI - s/p 2 stents  - 04/2016.  She had unstable angina and had a cardiac cath in 03/2018.  She was swimming before >> now Aquatic center closed 2/2 Coronavirus pandemic.  ROS: Constitutional: no weight gain/no weight loss, no fatigue, no subjective hyperthermia, no subjective hypothermia Eyes: no blurry vision, no xerophthalmia ENT: no sore throat, no nodules palpated in neck, no dysphagia, no odynophagia, no hoarseness Cardiovascular: no CP/no SOB/no palpitations/no leg swelling Respiratory: no cough/no SOB/no wheezing Gastrointestinal: no N/no V/no D/no C/no acid reflux Musculoskeletal: no muscle aches/no joint aches Skin: no rashes, no hair loss Neurological: no tremors/no numbness/no tingling/no dizziness  I reviewed pt's medications, allergies, PMH, social hx, family hx, and changes were documented in the history of present illness. Otherwise, unchanged from my initial visit note.  Past Medical History:  Diagnosis Date  . Arthritis    osteoarthritis-hips,knees, back, hands  . Asymmetric septal hypertrophy (HCC)   . Atrial fibrillation (HCC)   . CAD (coronary artery disease)    2 drug-eluting stents placed in the circumflex leading into a marginal.  May 2017.  [name].   catheterization on 03/19/2018, this showed patent stent in the proximal to mid left circumflex artery, 20% ostial left circumflex artery disease, 40% OM 3 disease, widely patent stent in the ostial D1, 20% ostial to proximal LAD disease, 20% mid LAD disease.  . Cataract    corrective surgery done  . Fatty liver   . Gallstones   . GERD (gastroesophageal reflux disease)   . Heart palpitations   . Hepatitis    hepatitis A; past hx.,many yrs ago ? food source  . Hernia, hiatal   . Hyperlipidemia   . Hypertension   . Obesity   . Sleep apnea    CPAP   Past Surgical History:  Procedure Laterality Date  . APPENDECTOMY  2004  . CATARACT EXTRACTION W/  INTRAOCULAR LENS IMPLANT     both eyes  . CHOLECYSTECTOMY    . LEFT HEART CATH AND CORONARY ANGIOGRAPHY N/A 03/19/2018   Procedure: LEFT HEART CATH AND CORONARY ANGIOGRAPHY;  Surgeon: Kathleene Hazel, MD;  Location: [facility];  Service: Cardiovascular;  Laterality: N/A;  . NM MYOCAR PERF WALL MOTION  08/15/04   No ischemia  . RETINAL DETACHMENT SURGERY Bilateral    remains with slight hazy vision with lights  . TOTAL HIP ARTHROPLASTY Right 08/04/2014   Procedure: RIGHT TOTAL HIP ARTHROPLASTY ANTERIOR APPROACH;  Surgeon: Loanne Drilling, MD;  Location: [facility];  Service: Orthopedics;  Laterality: Right;  . TOTAL HIP ARTHROPLASTY Left 01/05/2015   Procedure: LEFT TOTAL HIP ARTHROPLASTY ANTERIOR APPROACH;  Surgeon: Loanne Drilling, MD;  Location: [facility];  Service: Orthopedics;  Laterality: Left;  . US ECHOCARDIOGRAPHY  06/25/2012   Moderate ASH,LV hyperdynamic,LA is mod. dilated,trace MR,TR,AI   Social History   Socioeconomic History  . Marital status: Married    Spouse name: Not on file  . Number of children: 2  . Years of education: Not on file  . Highest education level: Not on file  Occupational History    Employer: OTHER  Social Needs  . Financial resource strain: Not on file  . Food insecurity:    Worry: Not on file    Inability: Not on file  . Transportation needs:    Medical: Not on file    Non-medical: Not on file  Tobacco Use  . Smoking status: Former Games developer  . Smokeless tobacco: Never Used  . Tobacco comment: QUIT IN 1990  Substance and Sexual Activity  . Alcohol use: No    Alcohol/week: 0.0 standard drinks  . Drug use: No  . Sexual activity: Yes  Lifestyle  . Physical activity:    Days per week: Not on file    Minutes per session: Not on file  . Stress: Not on file  Relationships  . Social connections:    Talks on phone: Not on file    Gets together: Not on file    Attends religious service: Not on file    Active member of club or organization:  Not on file    Attends meetings of clubs or organizations: Not on file    Relationship status: Not on file  . Intimate partner violence:    Fear of current or ex partner: Not on file    Emotionally abused: Not on file    Physically abused: Not on file    Forced sexual activity: Not on file  Other Topics Concern  . Not on file  Social History Narrative   Two grandchildren.            Current Outpatient Medications on File Prior to Visit  Medication Sig Dispense Refill  . aspirin EC 81 MG tablet Take 81 mg by mouth daily.    . Cal Carb-Mag Hydrox-Simeth (ROLAIDS ADVANCED PO) Take 2 tablets by mouth as needed (indigestion).    . CARTIA XT 240 MG 24 hr capsule TAKE 1 CAPSULE(240 MG) BY MOUTH AT BEDTIME 90 capsule 3  . Cholecalciferol (VITAMIN D) 2000 units CAPS Take 1 capsule by mouth daily.    . CRESTOR 20 MG tablet Take 1 tablet (20 mg total) by mouth daily. 90 tablet 3  . furosemide (LASIX) 40 MG tablet TAKE 1 TABLET BY MOUTH EVERY DAY WITH POTASSIUM PILL 90 tablet 1  . metoprolol succinate (TOPROL XL) 50 MG 24 hr tablet TAKE 1 TABLET BY MOUTH EVERY MORNING AND 1/2 TABLET EVERY EVENING 135 tablet 1  . nitroGLYCERIN (NITROSTAT) 0.4 MG SL tablet Place 1 tablet (0.4 mg total) under the tongue every 5 (five) minutes as needed for chest pain. 25 tablet 3  . potassium  chloride (K-DUR,KLOR-CON) 10 MEQ tablet Take 10 mEq by mouth daily.     Marland Kitchen Propylene Glycol (SYSTANE BALANCE OP) Place 1 drop into both eyes daily as needed (DRY EYES).     No current facility-administered medications on file prior to visit.    Allergies  Allergen Reactions  . Other Other (See Comments)    Gas bubble C3F8 has green bracelet **  . Atorvastatin Other (See Comments)    myalgias  . Codeine Nausea Only  . Kenalog [Triamcinolone Acetonide] Other (See Comments)    Had A.fib after she received the injection."Steroid meds"  . Relafen [Nabumetone] Other (See Comments)    Edema   . Metoprolol Other (See  Comments)    Generic metoprolol XL doesn't work for her after a few days. She takes brand name only Toprol XL. (per Cardiologist and confirmed with [facility])  . Penicillins Rash    40 years ago, injection site was red and swollen.   Has patient had a PCN reaction causing immediate rash, facial/tongue/throat swelling, SOB or lightheadedness with hypotension: YES Has patient had a PCN reaction causing severe rash involving mucus membranes or skin necrosis: NO Has patient had a PCN reaction that required hospitalization. No  Has patient had a PCN reaction occurring within the last 10 years: NO If all of the above answers are "NO", then may proceed with Cephalosporin use.    . Tramadol Other (See Comments)    Auditory halllucinations   Family History  Problem Relation Age of Onset  . Hypertension Mother   . CVA Mother   . Lung cancer Father 27       asbestos exposure  . Esophageal cancer Other        nephew  . Diabetes Other        nephew  . Colon cancer Neg Hx   . Pancreatic cancer Neg Hx   . Kidney disease Neg Hx   . Liver disease Neg Hx    PE: There were no vitals taken for this visit. Wt Readings from Last 3 Encounters:  11/13/18 293 lb 6.4 oz (133.1 kg)  11/05/18 294 lb (133.4 kg)  05/01/18 295 lb (133.8 kg)   Constitutional:  in NAD  The physical exam was not performed (telephone visit).  ASSESSMENT: 1. R Adrenal incidentaloma  2. Obesity class 3  3.  Prediabetes  PLAN:  1. Patient with a right-sided adrenal nodule, discovered incidentally -There are 3 possible scenarios -A nonfunctioning adrenal nodule -A functioning adrenal adenoma- which can hypersecrete catecholamines/metanephrines, cortisol, or aldosterone -Adrenal cancer or metastases. We do not have specific blood tests to check for cancer, but the best indicator is lack of change in size and appearance of the adrenal mass over time.  The nodule did not appear to be present on the CT abdomen  from 2004, however, per my review of the images, it is possible that the right adrenal was not well visualized at that time to rule out the nodule. -Reviewed her previous adrenal labs: -She refused a dexamethasone suppression test as she was telling me that she was very sensitive to exogenous steroids.  A 24-hour urine cortisol was performed to rule out Cushing syndrome (6% of adrenal incidentalomas).  This was normal -We checked fractionated metanephrines and catecholamines to rule out pheochromocytoma (3% of adrenal incidentalomas) and these were normal but at last visit she had a slightly elevated normetanephrine.  I explained that this is nonspecific and no intervention is needed.  We will recheck  this at next visit, in 6 months. -We checked a plasma renin activity and aldosterone level to rule out primary hyperaldosteronism (0.6% of adrenal incidentalomas) and these were normal. -We discussed that we will need to repeat the tests for total of 5 years to ensure nodule nonfunctionality.  We will repeat the tests at next visit. -She had imaging of her adrenal in 2018 and at today's visit and at last visit, I ordered another CT scan, this time, focused on the adrenals.  She did not have this.  I will try to order this again.  If this shows no change in her adrenal mass, no further imaging is necessary.  2. Obesity class 3 - At last visits, we discussed about a plant-based diet and she was planning to try this.  At that time, weight was higher by 6 pounds compared to the prior visit. - stable since last visit  3.  Prediabetes -Most recent HbA1c was 6.1% from last visit -We discussed at that time that this is a reversible disease provided that she is controlling her diet by limiting fatty foods and concentrated sweets and eating a lot of plant-based foods to reduce her insulin resistance.  I also recommended to continue swimming, as she cannot perform her weightbearing exercises due to joint pain. -At  this visit, she is not checking sugars, but she did so before the coronavirus pandemic started and they were slightly above target in the morning but not checking later in the day.  We discussed about the importance of starting to check sugars every day or every other day, rotating check times.  No treatment needed for now, but she is preparing to restart swimming, now that the aquatic center has opened again since last week. -I will see her back in 6 months  - time spent with the patient: 17 min, of which >50% was spent in obtaining information about her symptoms, reviewing her previous labs, evaluations, and treatments, counseling her about her conditions (please see the discussed topics above), and developing a plan to further investigate and treat them.  Carlus Pavlov, MD PhD [facility]

## 2019-05-05 NOTE — Patient Instructions (Addendum)
Please come back for a follow-up appointment in 6 months.  I will order an adrenal CT scan for you. Please let me know if you are not called with this in 1 week.  Please restart checking sugars every day or every other day.

## 2019-05-05 NOTE — Addendum Note (Signed)
Addended by: Carlus Pavlov on: 05/05/2019 12:16 PM   Modules accepted: Orders

## 2019-05-19 ENCOUNTER — Encounter: Payer: Self-pay | Admitting: Cardiology

## 2019-05-19 ENCOUNTER — Telehealth: Payer: Self-pay | Admitting: Cardiology

## 2019-05-19 NOTE — Telephone Encounter (Signed)
LMTCB, to reschedule virtual visit with Dr. Percival Spanish, when patient calls back reschedule appt, also schedule her ECHO appt.

## 2019-05-27 ENCOUNTER — Telehealth: Payer: Self-pay | Admitting: Cardiology

## 2019-05-27 NOTE — Telephone Encounter (Signed)
Phone visit/my chart/call [phone]/pre reg complete/consent obtained -- ttf

## 2019-05-28 ENCOUNTER — Telehealth (HOSPITAL_COMMUNITY): Payer: Self-pay | Admitting: Cardiology

## 2019-05-28 NOTE — Telephone Encounter (Signed)

## 2019-05-29 ENCOUNTER — Ambulatory Visit (HOSPITAL_COMMUNITY): Payer: Medicare Other | Attending: Internal Medicine

## 2019-05-29 ENCOUNTER — Other Ambulatory Visit: Payer: Self-pay

## 2019-05-29 DIAGNOSIS — I350 Nonrheumatic aortic (valve) stenosis: Secondary | ICD-10-CM | POA: Insufficient documentation

## 2019-06-01 ENCOUNTER — Telehealth: Payer: Self-pay | Admitting: Cardiology

## 2019-06-01 NOTE — Telephone Encounter (Signed)
I called Victoria Delgado to confirm her appt with Dr Percival Spanish on 06-02-19.

## 2019-06-01 NOTE — Progress Notes (Signed)
HPI The patient presents for follow up of CAD.  She has atrial fibrillation which she ultimately thinks was related to a steroid injection. This was in 2008. At that time she was found to have asymmetric septal hypertrophy. This has been followed conservatively.  Echocardiogram did demonstrate concentric LV hypertrophy which was moderate and moderate LAE.  In May of 2017 she was in the hospital with CAD.  She was admitted to [facility] on 5/24 after waking up with left arm pain.  She had positive troponin with  PCI to the mid LCX and 1st DX. She is on DAPT with aspirin/Effient. She had 2 drug-eluting stents placed in the circumflex leading into a marginal. This was apparently somewhat small circumflex. She also had a diagonal had a drug-eluting stent placed.  An echo in our office showed well preserved ejection fraction. There was some moderate aortic stenosis. There was no mention of hypertrophic obstruction although there is LVH.  She was in the hospital in April with chest pain.    She did have positive cardiac enzymes.   She underwent cardiac catheterization on 03/19/2018, this showed patent stent in the proximal to mid left circumflex artery, 20% ostial left circumflex artery disease, 40% OM 3 disease, widely patent stent in the ostial D1, 20% ostial to proximal LAD disease, 20% mid LAD disease.  Cardiac catheterization did confirm moderate aortic stenosis with mean gradient of 26.7 mmHg.  Medical therapy was recommended for her coronary artery disease.    Since I last saw her she had a follow up echo and her AS is severe with a mean gradient of 39 .  LVH is listed as severe as well.  On careful questioning today she continues to deny any new cardiovascular symptoms.  Unfortunately she is not doing water aerobics with the pandemic.  However, she still does activities like walks on level ground and does not bring on any symptoms.  She actually thinks she is feeling better than she used to.  She  denies any chest pressure, neck or arm discomfort.  She is not having any PND or orthopnea.  She is not having any palpitations, presyncope or syncope.  She has had none of the chest discomfort that she had with her previous stents.  Allergies  Allergen Reactions  . Other Other (See Comments)    Gas bubble C3F8 has green bracelet **  . Atorvastatin Other (See Comments)    myalgias  . Codeine Nausea Only  . Kenalog [Triamcinolone Acetonide] Other (See Comments)    Had A.fib after she received the injection."Steroid meds"  . Relafen [Nabumetone] Other (See Comments)    Edema   . Metoprolol Other (See Comments)    Generic metoprolol XL doesn't work for her after a few days. She takes brand name only Toprol XL. (per Cardiologist and confirmed with [facility])  . Penicillins Rash    40 years ago, injection site was red and swollen.   Has patient had a PCN reaction causing immediate rash, facial/tongue/throat swelling, SOB or lightheadedness with hypotension: YES Has patient had a PCN reaction causing severe rash involving mucus membranes or skin necrosis: NO Has patient had a PCN reaction that required hospitalization. No  Has patient had a PCN reaction occurring within the last 10 years: NO If all of the above answers are "NO", then may proceed with Cephalosporin use.    . Tramadol Other (See Comments)    Auditory halllucinations    Current Outpatient Medications  Medication Sig Dispense Refill  . aspirin EC 81 MG tablet Take 81 mg by mouth daily.    . Cal Carb-Mag Hydrox-Simeth (ROLAIDS ADVANCED PO) Take 2 tablets by mouth as needed (indigestion).    . CARTIA XT 240 MG 24 hr capsule TAKE 1 CAPSULE(240 MG) BY MOUTH AT BEDTIME 90 capsule 3  . Cholecalciferol (VITAMIN D) 2000 units CAPS Take 1 capsule by mouth daily.    . CRESTOR 20 MG tablet Take 1 tablet (20 mg total) by mouth daily. 90 tablet 3  . furosemide (LASIX) 40 MG tablet TAKE 1 TABLET BY MOUTH EVERY DAY WITH POTASSIUM  PILL 90 tablet 1  . metoprolol succinate (TOPROL XL) 50 MG 24 hr tablet TAKE 1 TABLET BY MOUTH EVERY MORNING AND 1/2 TABLET EVERY EVENING 135 tablet 1  . nitroGLYCERIN (NITROSTAT) 0.4 MG SL tablet Place 1 tablet (0.4 mg total) under the tongue every 5 (five) minutes as needed for chest pain. 25 tablet 3  . potassium chloride (K-DUR,KLOR-CON) 10 MEQ tablet Take 10 mEq by mouth daily.     Marland Kitchen Propylene Glycol (SYSTANE BALANCE OP) Place 1 drop into both eyes daily as needed (DRY EYES).     No current facility-administered medications for this visit.     Past Medical History:  Diagnosis Date  . Arthritis    osteoarthritis-hips,knees, back, hands  . Asymmetric septal hypertrophy (HCC)   . Atrial fibrillation ([place])   . CAD (coronary artery disease)    2 drug-eluting stents placed in the circumflex leading into a marginal.  May 2017.  [name].   catheterization on 03/19/2018, this showed patent stent in the proximal to mid left circumflex artery, 20% ostial left circumflex artery disease, 40% OM 3 disease, widely patent stent in the ostial D1, 20% ostial to proximal LAD disease, 20% mid LAD disease.  . Cataract    corrective surgery done  . Fatty liver   . Gallstones   . GERD (gastroesophageal reflux disease)   . Heart palpitations   . Hepatitis    hepatitis A; past hx.,many yrs ago ? food source  . Hernia, hiatal   . Hyperlipidemia   . Hypertension   . Obesity   . Sleep apnea    CPAP    Past Surgical History:  Procedure Laterality Date  . APPENDECTOMY  2004  . CATARACT EXTRACTION W/ INTRAOCULAR LENS IMPLANT     both eyes  . CHOLECYSTECTOMY    . LEFT HEART CATH AND CORONARY ANGIOGRAPHY N/A 03/19/2018   Procedure: LEFT HEART CATH AND CORONARY ANGIOGRAPHY;  Surgeon: Burnell Blanks, MD;  Location: [place] [facility];  Service: Cardiovascular;  Laterality: N/A;  . NM MYOCAR PERF WALL MOTION  08/15/04   No ischemia  . RETINAL DETACHMENT SURGERY Bilateral    remains with  slight hazy vision with lights  . TOTAL HIP ARTHROPLASTY Right 08/04/2014   Procedure: RIGHT TOTAL HIP ARTHROPLASTY ANTERIOR APPROACH;  Surgeon: Gearlean Alf, MD;  Location: [facility];  Service: Orthopedics;  Laterality: Right;  . TOTAL HIP ARTHROPLASTY Left 01/05/2015   Procedure: LEFT TOTAL HIP ARTHROPLASTY ANTERIOR APPROACH;  Surgeon: Gearlean Alf, MD;  Location: [facility];  Service: Orthopedics;  Laterality: Left;  . US ECHOCARDIOGRAPHY  06/25/2012   Moderate ASH,LV hyperdynamic,LA is mod. dilated,trace MR,TR,AI    ROS:    As stated in the HPI and negative for all other systems.  PHYSICAL EXAM BP (!) 145/68   Pulse 62   Temp (!) 97.3 F (36.3  C)   Ht 5\' 8"  (1.727 m)   Wt 287 lb 6.4 oz (130.4 kg)   BMI 43.70 kg/m   GENERAL:  Well appearing NECK:  No jugular venous distention, waveform within normal limits, carotid upstroke brisk and symmetric, no bruits, no thyromegaly LUNGS:  Clear to auscultation bilaterally CHEST:  Unremarkable HEART:  PMI not displaced or sustained,S1 and S2 within normal limits, no S3, no S4, no clicks, no rubs, 3 out of 6 apical systolic murmur radiating at the aortic outflow tract and into the carotids that does not increase with the strain phase of Valsalva, no diastolic murmurs ABD:  Flat, positive bowel sounds normal in frequency in pitch, no bruits, no rebound, no guarding, no midline pulsatile mass, no hepatomegaly, no splenomegaly EXT:  2 plus pulses throughout, no edema, no cyanosis no clubbing     Lab Results  Component Value Date   CHOL 172 11/13/2018   TRIG 184 (H) 11/13/2018   HDL 35 (L) 11/13/2018   LDLCALC 100 (H) 11/13/2018    EKG:  NA.   06/02/2019   ASSESSMENT AND PLAN   CAD:  Patient has no new symptoms suggestive of obstructive coronary disease.  No further cardiovascular testing is suggested.  Hypertrophic cardiomyopathy - This was severe on the echo done earlier this month.  However, there is no family history.  She has no real  symptoms related to this.  This may be related to her aortic stenosis though it is probably a primary phenomenon.  No change in therapy at this point.   AS - This is severe by echo.  However, after careful questioning she absolutely has no symptoms.  Again to follow her closely symptomatically and see her back in about 6 months.  She will let me know if she has any increasing shortness of breath between now and then.   Dyslipidemia - She did restart low-dose statin and I will check a lipid profile liver enzymes today.  Of note she also has had a low vitamin D level and I will repeat this.   Hypertension  Her blood pressure is slightly elevated today but this is quite unusual.  No change in therapy.   Morbid obesity She really needs to continue efforts of losing weight and we talked again about this today.

## 2019-06-02 ENCOUNTER — Telehealth: Payer: Medicare Other | Admitting: Cardiology

## 2019-06-02 ENCOUNTER — Ambulatory Visit (INDEPENDENT_AMBULATORY_CARE_PROVIDER_SITE_OTHER): Payer: Medicare Other | Admitting: Cardiology

## 2019-06-02 ENCOUNTER — Other Ambulatory Visit: Payer: Self-pay

## 2019-06-02 ENCOUNTER — Encounter: Payer: Self-pay | Admitting: Cardiology

## 2019-06-02 VITALS — BP 145/68 | HR 62 | Temp 97.3°F | Ht 68.0 in | Wt 287.4 lb

## 2019-06-02 DIAGNOSIS — I10 Essential (primary) hypertension: Secondary | ICD-10-CM | POA: Diagnosis not present

## 2019-06-02 DIAGNOSIS — I350 Nonrheumatic aortic (valve) stenosis: Secondary | ICD-10-CM

## 2019-06-02 DIAGNOSIS — E782 Mixed hyperlipidemia: Secondary | ICD-10-CM

## 2019-06-02 DIAGNOSIS — Z79899 Other long term (current) drug therapy: Secondary | ICD-10-CM

## 2019-06-02 DIAGNOSIS — I517 Cardiomegaly: Secondary | ICD-10-CM

## 2019-06-02 NOTE — Patient Instructions (Signed)
Medication Instructions:  Your physician recommends that you continue on your current medications as directed. Please refer to the Current Medication list given to you today.  If you need a refill on your cardiac medications before your next appointment, please call your pharmacy.   Lab work: LP/HFP/VITAMIN D LEVEL   If you have labs (blood work) drawn today and your tests are completely normal, you will receive your results only by: Marland Kitchen MyChart Message (if you have MyChart) OR . A paper copy in the mail If you have any lab test that is abnormal or we need to change your treatment, we will call you to review the results.  Testing/Procedures: NONE  Follow-Up: At [facility], you and your health needs are our priority.  As part of our continuing mission to provide you with exceptional heart care, we have created designated Provider Care Teams.  These Care Teams include your primary Cardiologist (physician) and Advanced Practice Providers (APPs -  Physician Assistants and Nurse Practitioners) who all work together to provide you with the care you need, when you need it. You will need a follow up appointment in 6 months.  Please call our office 2 months in advance to schedule this appointment.  You may see Minus Breeding, MD or one of the following Advanced Practice Providers on your designated Care Team:   Rosaria Ferries, PA-C . Jory Sims, DNP, ANP

## 2019-06-03 LAB — VITAMIN D 25 HYDROXY (VIT D DEFICIENCY, FRACTURES): Vit D, 25-Hydroxy: 53.8 ng/mL (ref 30.0–100.0)

## 2019-06-03 LAB — LIPID PANEL
Chol/HDL Ratio: 4.3 ratio (ref 0.0–4.4)
Cholesterol, Total: 168 mg/dL (ref 100–199)
HDL: 39 mg/dL — ABNORMAL LOW (ref >39)
LDL Calculated: 87 mg/dL (ref 0–99)
Triglycerides: 212 mg/dL — ABNORMAL HIGH (ref 0–149)
VLDL Cholesterol Cal: 42 mg/dL — ABNORMAL HIGH (ref 5–40)

## 2019-06-03 LAB — HEPATIC FUNCTION PANEL
ALT: 18 IU/L (ref 0–32)
AST: 18 IU/L (ref 0–40)
Albumin: 4.8 g/dL — ABNORMAL HIGH (ref 3.7–4.7)
Alkaline Phosphatase: 79 IU/L (ref 39–117)
Bilirubin Total: 0.4 mg/dL (ref 0.0–1.2)
Bilirubin, Direct: 0.13 mg/dL (ref 0.00–0.40)
Total Protein: 7 g/dL (ref 6.0–8.5)

## 2019-06-08 ENCOUNTER — Encounter (HOSPITAL_COMMUNITY): Payer: Self-pay

## 2019-06-08 ENCOUNTER — Emergency Department (HOSPITAL_COMMUNITY)
Admission: EM | Admit: 2019-06-08 | Discharge: 2019-06-09 | Disposition: A | Discharge status: Home / Self Care | Payer: Medicare Other | Attending: Emergency Medicine | Admitting: Emergency Medicine

## 2019-06-08 ENCOUNTER — Other Ambulatory Visit: Payer: Self-pay

## 2019-06-08 DIAGNOSIS — I10 Essential (primary) hypertension: Secondary | ICD-10-CM | POA: Diagnosis not present

## 2019-06-08 DIAGNOSIS — I4891 Unspecified atrial fibrillation: Secondary | ICD-10-CM | POA: Insufficient documentation

## 2019-06-08 DIAGNOSIS — Z79899 Other long term (current) drug therapy: Secondary | ICD-10-CM | POA: Diagnosis not present

## 2019-06-08 DIAGNOSIS — I2510 Atherosclerotic heart disease of native coronary artery without angina pectoris: Secondary | ICD-10-CM | POA: Diagnosis not present

## 2019-06-08 DIAGNOSIS — Z880 Allergy status to penicillin: Secondary | ICD-10-CM | POA: Insufficient documentation

## 2019-06-08 DIAGNOSIS — Z87891 Personal history of nicotine dependence: Secondary | ICD-10-CM | POA: Diagnosis not present

## 2019-06-08 DIAGNOSIS — Z7982 Long term (current) use of aspirin: Secondary | ICD-10-CM | POA: Diagnosis not present

## 2019-06-08 DIAGNOSIS — R002 Palpitations: Secondary | ICD-10-CM | POA: Diagnosis present

## 2019-06-08 MED ORDER — DILTIAZEM HCL 100 MG IV SOLR
5.0000 mg/h | INTRAVENOUS | Status: DC
  Filled 2019-06-08: qty 100

## 2019-06-08 MED ORDER — DILTIAZEM LOAD VIA INFUSION
10.0000 mg | Freq: Once | INTRAVENOUS | Status: DC
  Filled 2019-06-08: qty 10

## 2019-06-08 NOTE — ED Triage Notes (Signed)
Pt states she has a hx of A-fib but hasn't been in A-fib in 10 years, she felt her heart flutter tonight and has been in A-Fib for EMS

## 2019-06-08 NOTE — ED Provider Notes (Signed)
TIME SEEN: 11:50 PM  CHIEF COMPLAINT: Palpitations  HPI: Patient is a 78 year old female with history of paroxysmal atrial fibrillation, CAD status post 2 stents, PVCs and PACs, hypertension, hyperlipidemia, obesity who presents emergency department with palpitations that started around 10 PM tonight.  She states that she thought she may be in atrial fibrillation again.  States she is only had one previous episode years ago.  She is on diltiazem and took her dose of 240 mg tonight.  She is not on anticoagulation.  States that she always has palpitations that she attributes to her PVCs and PACs.  She denies any chest pain, shortness of breath, nausea, vomiting, diarrhea, fever, cough, lower extremity swelling or pain.  Given IV fluids with EMS and states that she is feeling better.  ROS: See HPI Constitutional: no fever  Eyes: no drainage  ENT: no runny nose   Cardiovascular:  no chest pain  Resp: no SOB  GI: no vomiting GU: no dysuria Integumentary: no rash  Allergy: no hives  Musculoskeletal: no leg swelling  Neurological: no slurred speech ROS otherwise negative  PAST MEDICAL HISTORY/PAST SURGICAL HISTORY:  Past Medical History:  Diagnosis Date  . Arthritis    osteoarthritis-hips,knees, back, hands  . Asymmetric septal hypertrophy (HCC)   . Atrial fibrillation (HCC)   . CAD (coronary artery disease)    2 drug-eluting stents placed in the circumflex leading into a marginal.  May 2017.  [name].   catheterization on 03/19/2018, this showed patent stent in the proximal to mid left circumflex artery, 20% ostial left circumflex artery disease, 40% OM 3 disease, widely patent stent in the ostial D1, 20% ostial to proximal LAD disease, 20% mid LAD disease.  . Cataract    corrective surgery done  . Fatty liver   . Gallstones   . GERD (gastroesophageal reflux disease)   . Heart palpitations   . Hepatitis    hepatitis A; past hx.,many yrs ago ? food source  . Hernia, hiatal   .  Hyperlipidemia   . Hypertension   . Obesity   . Sleep apnea    CPAP    MEDICATIONS:  Prior to Admission medications   Medication Sig Start Date End Date Taking? Authorizing Provider  aspirin EC 81 MG tablet Take 81 mg by mouth daily.    [provider]  Cal Carb-Mag Hydrox-Simeth (ROLAIDS ADVANCED PO) Take 2 tablets by mouth as needed (indigestion).    [provider]  CARTIA XT 240 MG 24 hr capsule TAKE 1 CAPSULE(240 MG) BY MOUTH AT BEDTIME 04/06/19   Rollene Rotunda, MD  Cholecalciferol (VITAMIN D) 2000 units CAPS Take 1 capsule by mouth daily.    [provider]  CRESTOR 20 MG tablet Take 1 tablet (20 mg total) by mouth daily. 01/15/19   Rollene Rotunda, MD  furosemide (LASIX) 40 MG tablet TAKE 1 TABLET BY MOUTH EVERY DAY WITH POTASSIUM PILL 01/05/19   Rollene Rotunda, MD  metoprolol succinate (TOPROL XL) 50 MG 24 hr tablet TAKE 1 TABLET BY MOUTH EVERY MORNING AND 1/2 TABLET EVERY EVENING 02/09/19   Rollene Rotunda, MD  nitroGLYCERIN (NITROSTAT) 0.4 MG SL tablet Place 1 tablet (0.4 mg total) under the tongue every 5 (five) minutes as needed for chest pain. 05/07/16   Rosalio Macadamia, NP  potassium chloride (K-DUR,KLOR-CON) 10 MEQ tablet Take 10 mEq by mouth daily.     [provider]  Propylene Glycol (SYSTANE BALANCE OP) Place 1 drop into both eyes daily as  needed (DRY EYES).    [provider]    ALLERGIES:  Allergies  Allergen Reactions  . Other Other (See Comments)    Gas bubble C3F8 has green bracelet **  . Atorvastatin Other (See Comments)    myalgias  . Codeine Nausea Only  . Kenalog [Triamcinolone Acetonide] Other (See Comments)    Had A.fib after she received the injection."Steroid meds"  . Relafen [Nabumetone] Other (See Comments)    Edema   . Metoprolol Other (See Comments)    Generic metoprolol XL doesn't work for her after a few days. She takes brand name only Toprol XL. (per Cardiologist and confirmed with Harmon)   . Penicillins Rash    40 years ago, injection site was red and swollen.   Has patient had a PCN reaction causing immediate rash, facial/tongue/throat swelling, SOB or lightheadedness with hypotension: YES Has patient had a PCN reaction causing severe rash involving mucus membranes or skin necrosis: NO Has patient had a PCN reaction that required hospitalization. No  Has patient had a PCN reaction occurring within the last 10 years: NO If all of the above answers are "NO", then may proceed with Cephalosporin use.    . Tramadol Other (See Comments)    Auditory halllucinations    SOCIAL HISTORY:  Social History   Tobacco Use  . Smoking status: Former Research scientist (life sciences)  . Smokeless tobacco: Never Used  . Tobacco comment: QUIT IN 1990  Substance Use Topics  . Alcohol use: No    Alcohol/week: 0.0 standard drinks    FAMILY HISTORY: Family History  Problem Relation Age of Onset  . Hypertension Mother   . CVA Mother   . Lung cancer Father 10       asbestos exposure  . Esophageal cancer Other        nephew  . Diabetes Other        nephew  . Colon cancer Neg Hx   . Pancreatic cancer Neg Hx   . Kidney disease Neg Hx   . Liver disease Neg Hx     EXAM: BP (!) 167/66 (BP Location: Right Arm)   Pulse 79   Temp 98.8 F (37.1 C) (Oral)   Resp 18   Ht 5\' 8"  (1.727 m)   Wt 127 kg   SpO2 97%   BMI 42.57 kg/m  CONSTITUTIONAL: Alert and oriented and responds appropriately to questions. Well-appearing; well-nourished, obese HEAD: Normocephalic EYES: Conjunctivae clear, pupils appear equal, EOMI ENT: normal nose; moist mucous membranes NECK: Supple, no meningismus, no nuchal rigidity, no LAD  CARD: Irregularly irregular and tachycardic into the 130s; S1 and S2 appreciated; no murmurs, no clicks, no rubs, no gallops RESP: Normal chest excursion without splinting or tachypnea; breath sounds clear and equal bilaterally; no wheezes, no rhonchi, no rales, no hypoxia or respiratory distress,  speaking full sentences ABD/GI: Normal bowel sounds; non-distended; soft, non-tender, no rebound, no guarding, no peritoneal signs, no hepatosplenomegaly BACK:  The back appears normal and is non-tender to palpation, there is no CVA tenderness EXT: Normal ROM in all joints; non-tender to palpation; no edema; normal capillary refill; no cyanosis, no calf tenderness or swelling    SKIN: Normal color for age and race; warm; no rash NEURO: Moves all extremities equally PSYCH: The patient's mood and manner are appropriate. Grooming and personal hygiene are appropriate.  MEDICAL DECISION MAKING: Patient here with A. fib with RVR.  She has no chest pain or shortness of breath.  She is in no  distress.  Blood pressure normal.  Will start IV L diltiazem.  Will obtain labs, chest x-ray.  Anticipate admission.  I do not feel patient is a candidate for electrocardioversion given she has intermittent palpitations and is unsure if she has been in A. fib recently and is not on anticoagulation.  ED PROGRESS: Patient's labs are unremarkable.  Electrolytes normal.  TSH normal.  Chest x-ray shows cardiomegaly with vascular congestion but no edema.  She has no chest pain or shortness of breath.  Patient converted spontaneously back into a normal rhythm.  She would like to talk to her cardiologist before adjusting her medications and before being started on anticoagulation.  Her chads vasc 2 score is 4.  We have discussed the risk of stroke with atrial fibrillation.  She states her cardiologist is Dr. Antoine PocheHochrein.  She will follow-up as an outpatient.  We will have her continue her diltiazem as prescribed.  Discussed return precautions.   At this time, I do not feel there is any life-threatening condition present. I have reviewed and discussed all results (EKG, imaging, lab, urine as appropriate) and exam findings with patient/family. I have reviewed nursing notes and appropriate previous records.  I feel the patient is safe to  be discharged home without further emergent workup and can continue workup as an outpatient as needed. Discussed usual and customary return precautions. Patient/family verbalize understanding and are comfortable with this plan.  Outpatient follow-up has been provided as needed. All questions have been answered.      EKG Interpretation  Date/Time:  Monday June 08 2019 23:24:56 EDT Ventricular Rate:  108 PR Interval:    QRS Duration: 98 QT Interval:  367 QTC Calculation: 492 R Axis:   -116 Text Interpretation:  Atrial fibrillation Ventricular premature complex Left anterior fascicular block RSR' in V1 or V2, right VCD or RVH Borderline prolonged QT interval No longer in sinus rhythm as compared to EKG in April 2019 Confirmed by Rochele RaringWard, Kristen [phone]([id number]) on 06/08/2019 11:51:27 PM         EKG Interpretation  Date/Time:  Tuesday June 09 2019 00:24:54 EDT Ventricular Rate:  82 PR Interval:    QRS Duration: 98 QT Interval:  406 QTC Calculation: 475 R Axis:   -111 Text Interpretation:  Sinus rhythm Left anterior fascicular block RSR' in V1 or V2, right VCD or RVH No longer in a fib Confirmed by Ward, Baxter HireKristen [phone]([id number]) on 06/09/2019 12:30:06 AM         CRITICAL CARE Performed by: Baxter HireKristen Ward   Total critical care time: 45 minutes  Critical care time was exclusive of separately billable procedures and treating other patients.  Critical care was necessary to treat or prevent imminent or life-threatening deterioration.  Critical care was time spent personally by me on the following activities: development of treatment plan with patient and/or surrogate as well as nursing, discussions with consultants, evaluation of patient's response to treatment, examination of patient, obtaining history from patient or surrogate, ordering and performing treatments and interventions, ordering and review of laboratory studies, ordering and review of radiographic studies, pulse oximetry and re-evaluation of  patient's condition.     Ward, Layla MawKristen N, DO 06/09/19 [phone]

## 2019-06-08 NOTE — ED Notes (Signed)
Pt ambulated to BR with steady gait, no assistance needed  

## 2019-06-09 ENCOUNTER — Other Ambulatory Visit: Payer: Self-pay

## 2019-06-09 ENCOUNTER — Emergency Department (HOSPITAL_COMMUNITY): Payer: Medicare Other

## 2019-06-09 ENCOUNTER — Encounter (HOSPITAL_COMMUNITY): Payer: Self-pay | Admitting: Emergency Medicine

## 2019-06-09 LAB — CBC WITH DIFFERENTIAL/PLATELET
Abs Immature Granulocytes: 0.03 10*3/uL (ref 0.00–0.07)
Basophils Absolute: 0 10*3/uL (ref 0.0–0.1)
Basophils Relative: 0 %
Eosinophils Absolute: 0.2 10*3/uL (ref 0.0–0.5)
Eosinophils Relative: 2 %
HCT: 44.3 % (ref 36.0–46.0)
Hemoglobin: 14.6 g/dL (ref 12.0–15.0)
Immature Granulocytes: 0 %
Lymphocytes Relative: 18 %
Lymphs Abs: 1.5 10*3/uL (ref 0.7–4.0)
MCH: 30.6 pg (ref 26.0–34.0)
MCHC: 33 g/dL (ref 30.0–36.0)
MCV: 92.9 fL (ref 80.0–100.0)
Monocytes Absolute: 0.7 10*3/uL (ref 0.1–1.0)
Monocytes Relative: 8 %
Neutro Abs: 6.2 10*3/uL (ref 1.7–7.7)
Neutrophils Relative %: 72 %
Platelets: 204 10*3/uL (ref 150–400)
RBC: 4.77 MIL/uL (ref 3.87–5.11)
RDW: 13 % (ref 11.5–15.5)
WBC: 8.6 10*3/uL (ref 4.0–10.5)
nRBC: 0 % (ref 0.0–0.2)

## 2019-06-09 LAB — BASIC METABOLIC PANEL
Anion gap: 9 (ref 5–15)
BUN: 16 mg/dL (ref 8–23)
CO2: 23 mmol/L (ref 22–32)
Calcium: 8.7 mg/dL — ABNORMAL LOW (ref 8.9–10.3)
Chloride: 108 mmol/L (ref 98–111)
Creatinine, Ser: 0.82 mg/dL (ref 0.44–1.00)
GFR calc Af Amer: >60 mL/min (ref >60)
GFR calc non Af Amer: >60 mL/min (ref >60)
Glucose, Bld: 209 mg/dL — ABNORMAL HIGH (ref 70–99)
Potassium: 3.8 mmol/L (ref 3.5–5.1)
Sodium: 140 mmol/L (ref 135–145)

## 2019-06-09 LAB — MAGNESIUM: Magnesium: 2.3 mg/dL (ref 1.7–2.4)

## 2019-06-09 LAB — TSH: TSH: 3.139 u[IU]/mL (ref 0.350–4.500)

## 2019-06-09 NOTE — ED Notes (Signed)
Patient is in sinus rhythm.

## 2019-06-09 NOTE — Discharge Instructions (Addendum)
I recommend that you call Dr. Rosezella Florida office in the morning for close outpatient follow-up.  They may recommend that you were started on anticoagulation to prevent risk of stroke with atrial fibrillation.  Your labs today were normal.  Your chest x-ray was clear.

## 2019-06-10 ENCOUNTER — Telehealth (HOSPITAL_COMMUNITY): Payer: Self-pay | Admitting: *Deleted

## 2019-06-10 NOTE — Telephone Encounter (Signed)
I cld pt to offer follow up appt since recent ED visit with afib.  Pt declined appt.  Pt was advised to contact Dr. Rosezella Florida office.

## 2019-07-04 ENCOUNTER — Other Ambulatory Visit: Payer: Self-pay | Admitting: Cardiology

## 2019-07-07 ENCOUNTER — Other Ambulatory Visit: Payer: Self-pay | Admitting: Cardiology

## 2019-09-03 ENCOUNTER — Other Ambulatory Visit: Payer: Self-pay | Admitting: Internal Medicine

## 2019-09-03 DIAGNOSIS — E278 Other specified disorders of adrenal gland: Secondary | ICD-10-CM

## 2019-09-08 ENCOUNTER — Ambulatory Visit
Admission: RE | Admit: 2019-09-08 | Discharge: 2019-09-08 | Disposition: A | Discharge status: Home / Self Care | Payer: Medicare Other | Source: Ambulatory Visit | Attending: Internal Medicine | Admitting: Internal Medicine

## 2019-09-08 DIAGNOSIS — E278 Other specified disorders of adrenal gland: Secondary | ICD-10-CM

## 2019-09-08 MED ORDER — IOPAMIDOL (ISOVUE-300) INJECTION 61%
125.0000 mL | Freq: Once | INTRAVENOUS | Status: AC | PRN
  Administered 2019-09-08: 11:00:00 125 mL via INTRAVENOUS

## 2019-10-01 ENCOUNTER — Other Ambulatory Visit: Payer: Self-pay

## 2019-10-01 ENCOUNTER — Encounter: Payer: Self-pay | Admitting: Family Medicine

## 2019-10-01 ENCOUNTER — Ambulatory Visit (INDEPENDENT_AMBULATORY_CARE_PROVIDER_SITE_OTHER): Payer: Medicare Other | Admitting: Family Medicine

## 2019-10-01 VITALS — BP 132/78 | HR 69 | Temp 97.3°F | Ht 68.0 in | Wt 288.6 lb

## 2019-10-01 DIAGNOSIS — Z7689 Persons encountering health services in other specified circumstances: Secondary | ICD-10-CM

## 2019-10-01 DIAGNOSIS — R911 Solitary pulmonary nodule: Secondary | ICD-10-CM | POA: Insufficient documentation

## 2019-10-01 DIAGNOSIS — Z23 Encounter for immunization: Secondary | ICD-10-CM | POA: Diagnosis not present

## 2019-10-01 NOTE — Progress Notes (Signed)
Victoria Delgado is a 78 y.o. female  Chief Complaint  Patient presents with  . Establish Care    patient wants to discuss recent CT scan results    HPI: Victoria Delgado is a 78 y.o. female here to establish care with our office. She was previously a patient of Dr. Constance GoltzSchoenhoff.  Pt would like flu vaccine today. She had prevnar 13 and pneumovax. She is unsure when this was, thinks less than 5 years ago but will check and let me know.   Specialists: cardio (Dr. Antoine PocheHochrein) - h/o MI, CAD, PAF, HTN, hyperlipidemia; endo (Dr. Elvera LennoxGherghe) - Rt benign adrenal adenoma, prediabetes; ophthalmology (Dr. Sherryll BurgerShah) She has appts in 11/2019 will all 3 of the above specialists. Derm (Amy SwazilandJordan)  Last mammo: 04/2018 - pt denies abnormal mammo and family h/o breast cancer Last Dexa: 03/2013 Last colonoscopy: Cologuard 05/2016 - negative; pt declines additional testing and family h/o CRC Last labs: 05/2019-06/2019  Pt is a former smoker 1 ppd x 15 years. She quit smoking 30 years ago. She recently has a CT abd/pelvis for f/u on known Rt adrenal adenoma. That is stable but pt was found to have 4mm nodule in RML. 40mo f/u with repeat CT chest is recommended.   Past Medical History:  Diagnosis Date  . Arthritis    osteoarthritis-hips,knees, back, hands  . Asymmetric septal hypertrophy (HCC)   . Atrial fibrillation (HCC)   . CAD (coronary artery disease)    2 drug-eluting stents placed in the circumflex leading into a marginal.  May 2017.  [facility].   catheterization on 03/19/2018, this showed patent stent in the proximal to mid left circumflex artery, 20% ostial left circumflex artery disease, 40% OM 3 disease, widely patent stent in the ostial D1, 20% ostial to proximal LAD disease, 20% mid LAD disease.  . Cataract    corrective surgery done  . Fatty liver   . Gallstones   . GERD (gastroesophageal reflux disease)   . Heart palpitations   . Hepatitis    hepatitis A; past hx.,many yrs ago ? food source  .  Hernia, hiatal   . Hyperlipidemia   . Hypertension   . Obesity   . Sleep apnea    CPAP    Past Surgical History:  Procedure Laterality Date  . APPENDECTOMY  2004  . CATARACT EXTRACTION W/ INTRAOCULAR LENS IMPLANT     both eyes  . CHOLECYSTECTOMY    . LEFT HEART CATH AND CORONARY ANGIOGRAPHY N/A 03/19/2018   Procedure: LEFT HEART CATH AND CORONARY ANGIOGRAPHY;  Surgeon: Kathleene HazelMcAlhany, Christopher D, MD;  Location: [facility];  Service: Cardiovascular;  Laterality: N/A;  . NM MYOCAR PERF WALL MOTION  08/15/04   No ischemia  . RETINAL DETACHMENT SURGERY Bilateral    remains with slight hazy vision with lights  . TOTAL HIP ARTHROPLASTY Right 08/04/2014   Procedure: RIGHT TOTAL HIP ARTHROPLASTY ANTERIOR APPROACH;  Surgeon: Loanne DrillingFrank Aluisio V, MD;  Location: [facility];  Service: Orthopedics;  Laterality: Right;  . TOTAL HIP ARTHROPLASTY Left 01/05/2015   Procedure: LEFT TOTAL HIP ARTHROPLASTY ANTERIOR APPROACH;  Surgeon: Loanne DrillingFrank Aluisio V, MD;  Location: [facility];  Service: Orthopedics;  Laterality: Left;  . US ECHOCARDIOGRAPHY  06/25/2012   Moderate ASH,LV hyperdynamic,LA is mod. dilated,trace MR,TR,AI    Social History   Socioeconomic History  . Marital status: Married    Spouse name: Not on file  . Number of children: 2  . Years of education: Not on  file  . Highest education level: Not on file  Occupational History    Employer: OTHER  Social Needs  . Financial resource strain: Not on file  . Food insecurity    Worry: Not on file    Inability: Not on file  . Transportation needs    Medical: Not on file    Non-medical: Not on file  Tobacco Use  . Smoking status: Former Games developer  . Smokeless tobacco: Never Used  . Tobacco comment: QUIT IN 1990  Substance and Sexual Activity  . Alcohol use: No    Alcohol/week: 0.0 standard drinks  . Drug use: No  . Sexual activity: Yes  Lifestyle  . Physical activity    Days per week: Not on file    Minutes per session: Not on file  . Stress: Not  on file  Relationships  . Social Musician on phone: Not on file    Gets together: Not on file    Attends religious service: Not on file    Active member of club or organization: Not on file    Attends meetings of clubs or organizations: Not on file    Relationship status: Not on file  . Intimate partner violence    Fear of current or ex partner: Not on file    Emotionally abused: Not on file    Physically abused: Not on file    Forced sexual activity: Not on file  Other Topics Concern  . Not on file  Social History Narrative   Two grandchildren.             Family History  Problem Relation Age of Onset  . Hypertension Mother   . CVA Mother   . Lung cancer Father 71       asbestos exposure  . Cancer Father   . Esophageal cancer Other        nephew  . Diabetes Other        nephew  . Colon cancer Neg Hx   . Pancreatic cancer Neg Hx   . Kidney disease Neg Hx   . Liver disease Neg Hx      Immunization History  Administered Date(s) Administered  . Influenza, High Dose Seasonal PF 09/23/2017  . Influenza,inj,Quad PF,6+ Mos 09/07/2013, 09/25/2016  . Influenza-Unspecified 10/04/2014    Outpatient Encounter Medications as of 10/01/2019  Medication Sig  . aspirin EC 81 MG tablet Take 81 mg by mouth daily.  Marland Kitchen CARTIA XT 240 MG 24 hr capsule TAKE 1 CAPSULE(240 MG) BY MOUTH AT BEDTIME (Patient taking differently: Take 240 mg by mouth at bedtime. )  . Cholecalciferol (VITAMIN D) 2000 units CAPS Take 1,000-2,000 Units by mouth See admin instructions. Take 2000 units in the morning and 1000 units in the evening  . Cinnamon 500 MG capsule Take 1,000 mg by mouth daily.  . CRESTOR 20 MG tablet Take 1 tablet (20 mg total) by mouth daily. (Patient taking differently: Take 10 mg by mouth every evening. )  . famotidine (PEPCID) 20 MG tablet Take 20 mg by mouth at bedtime.  . furosemide (LASIX) 40 MG tablet TAKE 1 TABLET BY MOUTH EVERY DAY WITH POTASSIUM PILL  . loteprednol  (LOTEMAX) 0.5 % ophthalmic suspension Place 1 drop into the left eye daily as needed (dry eyes).  . metoprolol succinate (TOPROL-XL) 50 MG 24 hr tablet TAKE 1 TABLET BY MOUTH EVERY MORNING AND 1/2 TABLET EVERY EVENING  . nitroGLYCERIN (NITROSTAT) 0.4 MG SL tablet Place  1 tablet (0.4 mg total) under the tongue every 5 (five) minutes as needed for chest pain. (Patient not taking: Reported on 10/01/2019)   No facility-administered encounter medications on file as of 10/01/2019.      ROS: Pertinent positives and negatives noted in HPI. Remainder of ROS non-contributory  Allergies  Allergen Reactions  . Other Other (See Comments)    Gas bubble C3F8 has green bracelet **  . Codeine Nausea Only  . Kenalog [Triamcinolone Acetonide] Other (See Comments)    Had A.fib after she received the injection."Steroid meds"  . Relafen [Nabumetone] Other (See Comments)    Edema   . Penicillins Rash    40 years ago, injection site was red and swollen.  rash, facial/tongue/throat swelling, SOB or lightheadedness with hypotension: Yes Has patient had a PCN reaction causing immediate  Has patient had a PCN reaction causing severe rash involving mucus membranes or skin necrosis: NO Has patient had a PCN reaction that required hospitalization. No  Has patient had a PCN reaction occurring within the last 10 years: No If all of the above answers are "NO", then may proceed with Cephalosporin use.    . Tramadol Other (See Comments)    Auditory halllucinations    BP 132/78   Pulse 69   Temp (!) 97.3 F (36.3 C) (Tympanic)   Ht 5\' 8"  (1.727 m)   Wt 288 lb 9.6 oz (130.9 kg)   SpO2 96%   BMI 43.88 kg/m   BP Readings from Last 3 Encounters:  10/01/19 132/78  06/09/19 135/61  06/02/19 (!) 145/68   Pulse Readings from Last 3 Encounters:  10/01/19 69  06/09/19 69  06/02/19 62   Wt Readings from Last 3 Encounters:  10/01/19 288 lb 9.6 oz (130.9 kg)  06/08/19 280 lb (127 kg)  06/02/19 287 lb 6.4 oz  (130.4 kg)    Physical Exam  Constitutional: She is oriented to person, place, and time. She appears well-developed and well-nourished. No distress.  Cardiovascular: Normal rate, regular rhythm and intact distal pulses.  Pulmonary/Chest: Effort normal and breath sounds normal. No respiratory distress. She has no wheezes. She has no rhonchi.  Neurological: She is alert and oriented to person, place, and time.  Psychiatric: She has a normal mood and affect.     A/P:  1. Encounter to establish care with new doctor  2. Need for influenza vaccination - Flu Vaccine QUAD High Dose(Fluad)  3. Pulmonary nodule, right - found incidentally  - former smoker (15pk yr hx) who quit 30 years ago - CT chest in 12 mo

## 2019-11-05 ENCOUNTER — Other Ambulatory Visit: Payer: Self-pay | Admitting: Internal Medicine

## 2019-11-05 DIAGNOSIS — J439 Emphysema, unspecified: Secondary | ICD-10-CM

## 2019-11-05 DIAGNOSIS — G4733 Obstructive sleep apnea (adult) (pediatric): Secondary | ICD-10-CM

## 2019-11-05 DIAGNOSIS — R911 Solitary pulmonary nodule: Secondary | ICD-10-CM

## 2019-11-06 ENCOUNTER — Other Ambulatory Visit (HOSPITAL_COMMUNITY): Payer: Self-pay | Admitting: Respiratory Therapy

## 2019-11-06 DIAGNOSIS — J439 Emphysema, unspecified: Secondary | ICD-10-CM

## 2019-11-11 ENCOUNTER — Other Ambulatory Visit: Payer: Self-pay

## 2019-11-11 ENCOUNTER — Encounter: Payer: Self-pay | Admitting: Internal Medicine

## 2019-11-11 ENCOUNTER — Ambulatory Visit (INDEPENDENT_AMBULATORY_CARE_PROVIDER_SITE_OTHER): Payer: Medicare Other | Admitting: Internal Medicine

## 2019-11-11 DIAGNOSIS — E278 Other specified disorders of adrenal gland: Secondary | ICD-10-CM | POA: Diagnosis not present

## 2019-11-11 DIAGNOSIS — R7303 Prediabetes: Secondary | ICD-10-CM

## 2019-11-11 NOTE — Progress Notes (Signed)
Patient ID: LEYLANI DULEY, female   DOB: 06/24/1941, 78 y.o.   MRN: [id number]   Patient location: Home My location: Office Persons participating in the virtual visit: patient, provider  Referring Provider: Charlane Ferretti, DO  I connected with the patient on 11/11/19 at 11:03 AM EST by a video enabled telemedicine application and verified that I am speaking with the correct person.   I discussed the limitations of evaluation and management by telemedicine and the availability of in person appointments. The patient expressed understanding and agreed to proceed.   Details of the encounter are shown below.  HPI  NIKKA HAKIMIAN is a 78 y.o.-year-old female, returning for follow-up for a R adrenal incidentaloma and prediabetes.  Last visit 6 months ago (virtual).  Adrenal incidentaloma Patient's adrenal mass was incidentally found during investigation for hemorrhagic cystitis.  She has a long standing history of back pain.  I reviewed patient's previous imaging test reports:: Abdominal CT (06/22/2017): 2.5 cm R adrenal nodule, 15 HU, not present in 2004; likely adenoma. Abdominal MRI (07/24/2017): hypointense nodule with homogeneous T2 signal and signal dropout consistent with benign adenoma Adrenal CT  (09/08/2019): Stable adrenal mass  Reviewed previous adrenal investigation-normal: Component     Latest Ref Rng & Units 11/07/2018  24 Hour urine volume (VMAHVA)     mL 2,150  Cortisol (Ur), Free     4.0 - 50.0 mcg/24 h 12.0  Results received     0.50 - 2.15 g/24 h 1.51  Extra Urine Specimen        Creatinine, 24H Ur     0.50 - 2.15 g/24 h 1.51  Normal 24-hour urine free cortisol  Component     Latest Ref Rng & Units 11/05/2018  Epinephrine     pg/mL 27  Norepinephrine     pg/mL 842  Dopamine     pg/mL see note  Catecholamines, Total     pg/mL 869  Metanephrine, Pl     <=57 pg/mL <25  Normetanephrine, Pl     <=148 pg/mL 183 (H)  Total Metanephrines-Plasma     <=205 pg/mL  183  Potassium     3.5 - 5.1 mEq/L 4.3  Minimally elevated normetanephrine-nonspecific  Component     Latest Ref Rng & Units 11/05/2018  ALDOSTERONE      ng/dL 10  Renin Activity     0.25 - 5.82 ng/mL/h 1.24  ALDO / PRA Ratio     0.9 - 28.9 Ratio 8.1  Normal aldosterone to PRA ratio  Previously: Component     Latest Ref Rng & Units 11/05/2017 11/08/2017  Epinephrine     pg/mL see note   Norepinephrine     pg/mL 471   Dopamine     pg/mL see note   Catecholamines, Total     pg/mL 471   Aldosterone, Serum      ng/dL 4   Renin Activity     0.25 - 5.82 ng/mL/h 0.88   ALDO / PRA Ratio     0.9 - 28.9 Ratio 4.5   Metanephrine, Pl     <=57 pg/mL 27   Normetanephrine, Pl     <=148 pg/mL 134   Total Metanephrines-Plasma     <=205 pg/mL 161   24 Hour urine volume (VMAHVA)     mL  2,250  Cortisol (Ur), Free     4.0 - 50.0 mcg/24 h  14.4  Results received     0.50 - 2.15 g/24 h  1.46  Potassium     3.5 - 5.1 mEq/L 4.5    She also has history of HTN and Ao Stenosis.  She is on metoprolol, Cardizem, furosemide.  Prediabetes: -After starting statins in the past  Reviewed HbA1c levels: Lab Results  Component Value Date   HGBA1C 6.1 (A) 11/05/2018  2019: HbA1c: 6.3%.  She is checking sugars 0 to once a day: - am: 130-140 >> 135-150, 160 - 2h after b'fast: n/c - lunch: n/c - 2h after lunch: n/c - dinner: 102-107 - 2h after dinner: n/c - bedtime: n/c  No CKD: Lab Results  Component Value Date   BUN 16 06/09/2019   Lab Results  Component Value Date   CREATININE 0.82 06/09/2019   Plus HL: Lab Results  Component Value Date   CHOL 168 06/02/2019   HDL 39 (L) 06/02/2019   LDLCALC 87 06/02/2019   TRIG 212 (H) 06/02/2019   CHOLHDL 4.3 06/02/2019  She is on Crestor 10 mg daily but still has muscle and ankle cramps with.  She feels that this is increasing her sugars.  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  Latest eye exam 11/2018:?  DR. She  had a history of 3 episodes of retinal detachment.  She had a steroid inj for bursitis ~2008, not since.  Steroids caused her A. fib in the past.  She also has a h/o AMI - s/p 2 stents  - 04/2016.  She had unstable angina and had a cardiac cath in 03/2018.  She was swimming before but the aquatic center is now closed due to the coronavirus pandemic.  ROS: Constitutional: no weight gain/+ weight loss, no fatigue, no subjective hyperthermia, no subjective hypothermia Eyes: no blurry vision, no xerophthalmia ENT: no sore throat, no nodules palpated in neck, no dysphagia, no odynophagia, no hoarseness Cardiovascular: no CP/+ SOB/no palpitations/no leg swelling Respiratory: no cough/+ SOB/no wheezing Gastrointestinal: no N/no V/no D/no C/no acid reflux Musculoskeletal: no muscle aches/no joint aches Skin: no rashes, no hair loss Neurological: no tremors/no numbness/no tingling/no dizziness  I reviewed pt's medications, allergies, PMH, social hx, family hx, and changes were documented in the history of present illness. Otherwise, unchanged from my initial visit note.  Past Medical History:  Diagnosis Date  . Arthritis    osteoarthritis-hips,knees, back, hands  . Asymmetric septal hypertrophy (HCC)   . Atrial fibrillation (HCC)   . CAD (coronary artery disease)    2 drug-eluting stents placed in the circumflex leading into a marginal.  [place]  [facility]   catheterization on 03/19/2018, this showed patent stent in the proximal to mid left circumflex artery, 20% ostial left circumflex artery disease, 40% OM 3 disease, widely patent stent in the ostial D1, 20% ostial to proximal LAD disease, 20% mid LAD disease.  . Cataract    corrective surgery done  . Fatty liver   . Gallstones   . GERD (gastroesophageal reflux disease)   . Heart palpitations   . Hepatitis    hepatitis A; past hx.,many yrs ago ? food source  . Hernia, hiatal   . Hyperlipidemia   . Hypertension   . Obesity   .  Sleep apnea    CPAP   Past Surgical History:  Procedure Laterality Date  . APPENDECTOMY  2004  . CATARACT EXTRACTION W/ INTRAOCULAR LENS IMPLANT     both eyes  . CHOLECYSTECTOMY    . LEFT HEART CATH AND CORONARY ANGIOGRAPHY N/A 03/19/2018   Procedure: LEFT HEART CATH  AND CORONARY ANGIOGRAPHY;  Surgeon: Kathleene Hazel, MD;  Location: [facility];  Service: Cardiovascular;  Laterality: N/A;  . NM MYOCAR PERF WALL MOTION  08/15/04   No ischemia  . RETINAL DETACHMENT SURGERY Bilateral    remains with slight hazy vision with lights  . TOTAL HIP ARTHROPLASTY Right 08/04/2014   Procedure: RIGHT TOTAL HIP ARTHROPLASTY ANTERIOR APPROACH;  Surgeon: Loanne Drilling, MD;  Location: [facility];  Service: Orthopedics;  Laterality: Right;  . TOTAL HIP ARTHROPLASTY Left 01/05/2015   Procedure: LEFT TOTAL HIP ARTHROPLASTY ANTERIOR APPROACH;  Surgeon: Loanne Drilling, MD;  Location: [facility];  Service: Orthopedics;  Laterality: Left;  . US ECHOCARDIOGRAPHY  06/25/2012   Moderate ASH,LV hyperdynamic,LA is mod. dilated,trace MR,TR,AI   Social History   Socioeconomic History  . Marital status: Married    Spouse name: Not on file  . Number of children: 2  . Years of education: Not on file  . Highest education level: Not on file  Occupational History    Employer: OTHER  Social Needs  . Financial resource strain: Not on file  . Food insecurity    Worry: Not on file    Inability: Not on file  . Transportation needs    Medical: Not on file    Non-medical: Not on file  Tobacco Use  . Smoking status: Former Games developer  . Smokeless tobacco: Never Used  . Tobacco comment: QUIT IN 1990  Substance and Sexual Activity  . Alcohol use: No    Alcohol/week: 0.0 standard drinks  . Drug use: No  . Sexual activity: Yes  Lifestyle  . Physical activity    Days per week: Not on file    Minutes per session: Not on file  . Stress: Not on file  Relationships  . Social Musician on phone: Not on  file    Gets together: Not on file    Attends religious service: Not on file    Active member of club or organization: Not on file    Attends meetings of clubs or organizations: Not on file    Relationship status: Not on file  . Intimate partner violence    Fear of current or ex partner: Not on file    Emotionally abused: Not on file    Physically abused: Not on file    Forced sexual activity: Not on file  Other Topics Concern  . Not on file  Social History Narrative   Two grandchildren.            Current Outpatient Medications on File Prior to Visit  Medication Sig Dispense Refill  . aspirin EC 81 MG tablet Take 81 mg by mouth daily.    Marland Kitchen CARTIA XT 240 MG 24 hr capsule TAKE 1 CAPSULE(240 MG) BY MOUTH AT BEDTIME (Patient taking differently: Take 240 mg by mouth at bedtime. ) 90 capsule 3  . Cholecalciferol (VITAMIN D) 2000 units CAPS Take 1,000-2,000 Units by mouth See admin instructions. Take 2000 units in the morning and 1000 units in the evening    . Cinnamon 500 MG capsule Take 1,000 mg by mouth daily.    . CRESTOR 20 MG tablet Take 1 tablet (20 mg total) by mouth daily. (Patient taking differently: Take 10 mg by mouth every evening. ) 90 tablet 3  . famotidine (PEPCID) 20 MG tablet Take 20 mg by mouth at bedtime.    . furosemide (LASIX) 40 MG tablet TAKE 1 TABLET BY  MOUTH EVERY DAY WITH POTASSIUM PILL 90 tablet 3  . loteprednol (LOTEMAX) 0.5 % ophthalmic suspension Place 1 drop into the left eye daily as needed (dry eyes).    . metoprolol succinate (TOPROL-XL) 50 MG 24 hr tablet TAKE 1 TABLET BY MOUTH EVERY MORNING AND 1/2 TABLET EVERY EVENING 135 tablet 1  . nitroGLYCERIN (NITROSTAT) 0.4 MG SL tablet Place 1 tablet (0.4 mg total) under the tongue every 5 (five) minutes as needed for chest pain. (Patient not taking: Reported on 10/01/2019) 25 tablet 3   No current facility-administered medications on file prior to visit.    Allergies  Allergen Reactions  . Other Other (See  Comments)    Gas bubble [id number] has green bracelet **  . Codeine Nausea Only  . Kenalog [Triamcinolone Acetonide] Other (See Comments)    Had A.fib after she received the injection."Steroid meds"  . Relafen [Nabumetone] Other (See Comments)    Edema   . Penicillins Rash    40 years ago, injection site was red and swollen.  rash, facial/tongue/throat swelling, SOB or lightheadedness with hypotension: Yes Has patient had a PCN reaction causing immediate  Has patient had a PCN reaction causing severe rash involving mucus membranes or skin necrosis: NO Has patient had a PCN reaction that required hospitalization. No  Has patient had a PCN reaction occurring within the last 10 years: No If all of the above answers are "NO", then may proceed with Cephalosporin use.    . Tramadol Other (See Comments)    Auditory halllucinations   Family History  Problem Relation Age of Onset  . Hypertension Mother   . CVA Mother   . Lung cancer Father [id number]       asbestos exposure  . Cancer Father   . Esophageal cancer Other        nephew  . Diabetes Other        nephew  . Colon cancer Neg Hx   . Pancreatic cancer Neg Hx   . Kidney disease Neg Hx   . Liver disease Neg Hx    PE: There were no vitals taken for this visit. Wt Readings from Last 3 Encounters:  10/01/19 288 lb 9.6 oz (130.9 kg)  06/08/19 280 lb (127 kg)  06/02/19 287 lb 6.4 oz (130.4 kg)   Constitutional:  in NAD  The physical exam was not performed (virtual visit).  ASSESSMENT: 1. R Adrenal incidentaloma  2. Obesity class 3  3.  Prediabetes  PLAN:  1. Patient with a right-sided adrenal nodule, discovered incidentally. -We discussed that there are 3 possible scenarios: -A nonfunctioning adrenal nodule -A functioning adrenal adenoma- which can hypersecrete catecholamines/metanephrines, cortisol, or aldosterone -Adrenal cancer or metastases. -She had previous investigation for the adrenal nodule:  -She refused a dexamethasone  suppression test as she mentioned that she was very sensitive to exogenous steroids.  The 24-hour urine cortisol was performed to rule out Cushing syndrome (6% of adrenal incidentaloma) and this was normal.  -We checked fractionated metanephrines and catecholamines to rule out pheochromocytoma (3% of adrenal incidentaloma's) and these were normal but at last check, she had a slightly elevated normetanephrine.  I explained that this is nonspecific and we will plan to repeat the evaluation, without intervention.  -We will check plasma renin activity and aldosterone level to rule out primary hyperaldosteronism, 0.6% of adrenal incidentalomas) and these were normal - I explained that the best indicator that her nodule is not cancerous is lack of change in size and  appearance over time.  The nodule did not appear to present on the CT of the abdomen from 2004, but, per my review of the images, it is possible that the right adrenal was not well visualized at the time.  She had imaging of her adrenals in 2018 and another dedicated adrenal CT abdomen on 09/08/2019, recently, which showed a stable adrenal mass.  No further imaging tests are necessary to investigate her adrenal nodule. -At this visit, we again discussed that we need to repeat the test for total of 5 years to ensure the nodule normal functionality.  2. Obesity class 3 -I suggested a plant-based diet in the past.  She was interested in this but did not start.   -She lost few pounds since last visit, reportedly  3.  Prediabetes -Most recent HbA1c was 6.1% a year ago.  She will need another recheck as soon as she can return to the clinic (now coronavirus pandemic. -We discussed in the past about the fact that her prediabetes is a reversible condition, provided that she is controlling her diet by limiting fatty foods and concentrated sweets and eating a lot of days with to reduce her insulin visit.  She cannot perform weight bearing exercises due to  joint pain and I suggested sleeping in the past. -At last visit I advised her to start checking sugars every day or at least every other day, rotating check times.  I did not suggest starting medication. -At this visit, she is telling me that she has yogurt in the morning and fruit for lunch.  She feels that her sugars are higher.  Reviewing her checks at home, they are, indeed, higher in the morning.  She is not checking sugars at that time and I strongly advised her to do so.  I will also have her back for her HbA1c before her next visit.  Depending on the results, we may need to start Metformin. -I will see her back in 4 months  Orders Placed This Encounter  Procedures  . Catecholamines, fractionated, plasma  . Aldosterone + renin activity w/ ratio  . Potassium  . Metanephrines, plasma  . Creatinine, urine, 24 hour  . Cortisol,free,24 hour urine w/creatinine  . Hemoglobin A1c    Philemon Kingdom, MD PhD [facility]

## 2019-11-11 NOTE — Patient Instructions (Signed)
Please come back for labs as soon as safe.  Check sugars later in the day, also.  Please come back for a follow-up appointment in 4 months.

## 2019-11-14 ENCOUNTER — Other Ambulatory Visit (HOSPITAL_COMMUNITY)
Admission: RE | Admit: 2019-11-14 | Discharge: 2019-11-14 | Disposition: A | Discharge status: Home / Self Care | Payer: Medicare Other | Source: Ambulatory Visit | Attending: Internal Medicine | Admitting: Internal Medicine

## 2019-11-14 DIAGNOSIS — Z20828 Contact with and (suspected) exposure to other viral communicable diseases: Secondary | ICD-10-CM | POA: Diagnosis present

## 2019-11-15 LAB — NOVEL CORONAVIRUS, NAA (HOSP ORDER, SEND-OUT TO REF LAB; TAT 18-24 HRS): SARS-CoV-2, NAA: NOT DETECTED

## 2019-11-18 ENCOUNTER — Ambulatory Visit (HOSPITAL_COMMUNITY)
Admission: RE | Admit: 2019-11-18 | Discharge: 2019-11-18 | Disposition: A | Discharge status: Home / Self Care | Payer: Medicare Other | Source: Ambulatory Visit | Attending: Internal Medicine | Admitting: Internal Medicine

## 2019-11-18 ENCOUNTER — Other Ambulatory Visit: Payer: Self-pay

## 2019-11-18 DIAGNOSIS — J439 Emphysema, unspecified: Secondary | ICD-10-CM | POA: Insufficient documentation

## 2019-11-18 LAB — PULMONARY FUNCTION TEST
DL/VA % pred: 112 %
DL/VA: 4.49 ml/min/mmHg/L
DLCO unc % pred: 93 %
DLCO unc: 20.26 ml/min/mmHg
FEF 25-75 Post: 2.03 L/sec
FEF 25-75 Pre: 1.54 L/sec
FEF2575-%Change-Post: 31 %
FEF2575-%Pred-Post: 109 %
FEF2575-%Pred-Pre: 83 %
FEV1-%Change-Post: 4 %
FEV1-%Pred-Post: 77 %
FEV1-%Pred-Pre: 73 %
FEV1-Post: 1.91 L
FEV1-Pre: 1.82 L
FEV1FVC-%Change-Post: -2 %
FEV1FVC-%Pred-Pre: 105 %
FEV6-%Change-Post: 6 %
FEV6-%Pred-Post: 78 %
FEV6-%Pred-Pre: 73 %
FEV6-Post: 2.44 L
FEV6-Pre: 2.3 L
FEV6FVC-%Change-Post: 0 %
FEV6FVC-%Pred-Post: 103 %
FEV6FVC-%Pred-Pre: 104 %
FVC-%Change-Post: 7 %
FVC-%Pred-Post: 75 %
FVC-%Pred-Pre: 70 %
FVC-Post: 2.48 L
FVC-Pre: 2.31 L
Post FEV1/FVC ratio: 77 %
Post FEV6/FVC ratio: 99 %
Pre FEV1/FVC ratio: 79 %
Pre FEV6/FVC Ratio: 99 %
RV % pred: 112 %
RV: 2.82 L
TLC % pred: 97 %
TLC: 5.51 L

## 2019-11-18 MED ORDER — ALBUTEROL SULFATE (2.5 MG/3ML) 0.083% IN NEBU
2.5000 mg | INHALATION_SOLUTION | Freq: Once | RESPIRATORY_TRACT | Status: AC
  Administered 2019-11-18: 12:00:00 2.5 mg via RESPIRATORY_TRACT

## 2019-11-19 NOTE — Progress Notes (Signed)
Cardiology Office Note   Date:  11/20/2019   ID:  Victoria Delgado, DOB [birth year], MRN [id number]  PCP:  Victoria Margarita, DO  Cardiologist:   Victoria Breeding, MD   Chief Complaint  Patient presents with  . Palpitations      History of Present Illness: Victoria Delgado is a 78 y.o. female who presents for follow up of CAD.  She has atrial fibrillation which she ultimately thinks was related to a steroid injection. This was in 2008. At that time she was found to have asymmetric septal hypertrophy. This has been followed conservatively.  Echocardiogram did demonstrate concentric LV hypertrophy which was moderate and moderate LAE.  In May of 2017 she was in the hospital with CAD.  She was admitted to [facility] on 5/24 after waking up with left arm pain.  She had positive troponin with  PCI to the mid LCX and 1st DX. She is on DAPT with aspirin/Effient. She had 2 drug-eluting stents placed in the circumflex leading into a marginal. This was apparently somewhat small circumflex. She also had a diagonal had a drug-eluting stent placed.  An echo in our office showed well preserved ejection fraction. There was some moderate aortic stenosis. There was no mention of hypertrophic obstruction although there is LVH.  She was in the hospital in April 2019 with chest pain.    She did have positive cardiac enzymes.   She underwent cardiac catheterization on 03/19/2018, this showed patent stent in the proximal to mid left circumflex artery, 20% ostial left circumflex artery disease, 40% OM 3 disease, widely patent stent in the ostial D1, 20% ostial to proximal LAD disease, 20% mid LAD disease.  Cardiac catheterization did confirm moderate aortic stenosis with mean gradient of 26.7 mmHg.  Medical therapy was recommended for her coronary artery disease.  She had a follow up echo and her AS is severe.   She was in the ED in July with atrial fib with rapid rate.  I reviewed these records for this visit.  She was  treated with Cardizem and converted to NSR.  I did review and verify that there was atrial fib on the EKG.    She says she does have some shortness of breath.  Pain management for some chronic lung issues.  She does not think her breathing is exceptionally worse and was just revealed a little bit better than she did previously.  She does have some palpitations occasionally.  She denies any chest pressure, neck or arm discomfort.  She has no PND or orthopnea.  She had no weight gain or edema.  In fact she is lost a little bit of weight.  She is unfortunately not been able to be as active as she was because of coronavirus.   Past Medical History:  Diagnosis Date  . Arthritis    osteoarthritis-hips,knees, back, hands  . Asymmetric septal hypertrophy (HCC)   . Atrial fibrillation ([place])   . CAD (coronary artery disease)    2 drug-eluting stents placed in the circumflex leading into a marginal.  May 2017.  Victoria Delgado.   catheterization on 03/19/2018, this showed patent stent in the proximal to mid left circumflex artery, 20% ostial left circumflex artery disease, 40% OM 3 disease, widely patent stent in the ostial D1, 20% ostial to proximal LAD disease, 20% mid LAD disease.  . Cataract    corrective surgery done  . Fatty liver   . Gallstones   . GERD (gastroesophageal reflux  disease)   . Heart palpitations   . Hepatitis    hepatitis A; past hx.,many yrs ago ? food source  . Hernia, hiatal   . Hyperlipidemia   . Hypertension   . Obesity   . Sleep apnea    CPAP    Past Surgical History:  Procedure Laterality Date  . APPENDECTOMY  2004  . CATARACT EXTRACTION W/ INTRAOCULAR LENS IMPLANT     both eyes  . CHOLECYSTECTOMY    . LEFT HEART CATH AND CORONARY ANGIOGRAPHY N/A 03/19/2018   Procedure: LEFT HEART CATH AND CORONARY ANGIOGRAPHY;  Surgeon: Kathleene Delgado, Victoria D, MD;  Location: [facility];  Service: Cardiovascular;  Laterality: N/A;  . NM MYOCAR PERF WALL MOTION  08/15/04   No  ischemia  . RETINAL DETACHMENT SURGERY Bilateral    remains with slight hazy vision with lights  . TOTAL HIP ARTHROPLASTY Right 08/04/2014   Procedure: RIGHT TOTAL HIP ARTHROPLASTY ANTERIOR APPROACH;  Surgeon: Victoria DrillingFrank Aluisio V, MD;  Location: [facility];  Service: Orthopedics;  Laterality: Right;  . TOTAL HIP ARTHROPLASTY Left 01/05/2015   Procedure: LEFT TOTAL HIP ARTHROPLASTY ANTERIOR APPROACH;  Surgeon: Victoria DrillingFrank Aluisio V, MD;  Location: [facility];  Service: Orthopedics;  Laterality: Left;  . US ECHOCARDIOGRAPHY  06/25/2012   Moderate ASH,LV hyperdynamic,LA is mod. dilated,trace MR,TR,AI     Current Outpatient Medications  Medication Sig Dispense Refill  . CARTIA XT 240 MG 24 hr capsule TAKE 1 CAPSULE(240 MG) BY MOUTH AT BEDTIME (Patient taking differently: Take 240 mg by mouth at bedtime. ) 90 capsule 3  . Cholecalciferol (VITAMIN Delgado) 2000 units CAPS Take 1,000-2,000 Units by mouth See admin instructions. Take 2000 units in the morning and 1000 units in the evening    . Cinnamon 500 MG capsule Take 1,000 mg by mouth daily.    . CRESTOR 20 MG tablet Take 1 tablet (20 mg total) by mouth daily. (Patient taking differently: Take 10 mg by mouth every evening. ) 90 tablet 3  . famotidine (PEPCID) 20 MG tablet Take 20 mg by mouth at bedtime.    . furosemide (LASIX) 40 MG tablet TAKE 1 TABLET BY MOUTH EVERY DAY WITH POTASSIUM PILL 90 tablet 3  . loteprednol (LOTEMAX) 0.5 % ophthalmic suspension Place 1 drop into the left eye daily as needed (dry eyes).    . metoprolol succinate (TOPROL-XL) 50 MG 24 hr tablet TAKE 1 TABLET BY MOUTH EVERY MORNING AND 1/2 TABLET EVERY EVENING 135 tablet 1  . nitroGLYCERIN (NITROSTAT) 0.4 MG SL tablet Place 1 tablet (0.4 mg total) under the tongue every 5 (five) minutes as needed for chest pain. 25 tablet 3  . apixaban (ELIQUIS) 5 MG TABS tablet Take 1 tablet (5 mg total) by mouth 2 (two) times daily. 60 tablet 3   No current facility-administered medications for this visit.     Allergies:   Other, Codeine, Kenalog [triamcinolone acetonide], Relafen [nabumetone], Penicillins, and Tramadol    ROS:  Please see the history of present illness.   Otherwise, review of systems are positive for none.   All other systems are reviewed and negative.    PHYSICAL EXAM: VS:  BP 126/62   Pulse 66   Ht 5\' 8"  (1.727 m)   Wt 285 lb (129.3 kg)   SpO2 96%   BMI 43.33 kg/m  , BMI Body mass index is 43.33 kg/m. GENERAL:  Well appearing NECK:  No jugular venous distention, waveform within normal limits, carotid upstroke brisk and symmetric, no  bruits, no thyromegaly LUNGS:  Clear to auscultation bilaterally BACK:  No CVA tenderness CHEST:  Unremarkable HEART:  PMI not displaced or sustained,S1 and S2 within normal limits, no S3, no S4, no clicks, no rubs, 3 out of 6 apical systolic murmur mid peaking, no diastolic murmurs ABD:  Flat, positive bowel sounds normal in frequency in pitch, no bruits, no rebound, no guarding, no midline pulsatile mass, no hepatomegaly, no splenomegaly EXT:  2 plus pulses throughout, no edema, no cyanosis no clubbing    EKG:  EKG is not ordered today.   Recent Labs: 06/02/2019: ALT 18 06/09/2019: BUN 16; Creatinine, Ser 0.82; Hemoglobin 14.6; Magnesium 2.3; Platelets 204; Potassium 3.8; Sodium 140; TSH 3.139    Lipid Panel    Component Value Date/Time   CHOL 168 06/02/2019 1245   TRIG 212 (H) 06/02/2019 1245   HDL 39 (L) 06/02/2019 1245   CHOLHDL 4.3 06/02/2019 1245   CHOLHDL 5.1 03/17/2018 0034   VLDL 37 03/17/2018 0034   LDLCALC 87 06/02/2019 1245      Wt Readings from Last 3 Encounters:  11/20/19 285 lb (129.3 kg)  10/01/19 288 lb 9.6 oz (130.9 kg)  06/08/19 280 lb (127 kg)      Other studies Reviewed: Additional studies/ records that were reviewed today include: Echo , CT of the adrenal. Review of the above records demonstrates:  Please see elsewhere in the note.     ASSESSMENT AND PLAN:  ATRIAL FIB:   She has Ms. Jaclyn PrimeJoan  S [place] has a CHA2DS2 - VASc score 5.  She is having paroxysms of this.  Anticoagulation is indicated.  She has no contraindication.  We talked about risk benefit.  I plan to treat her with Eliquis and I will stop aspirin.  She is instructed to get a CBC at her primary care office when she sees him in January.  CAD:  The patient has no new sypmtoms.  No further cardiovascular testing is indicated.  We will continue with aggressive risk reduction and meds as listed.  Hypertrophic cardiomyopathy - This was severe on the echo.  I am following this clinically as below.  AS - This is severe by echo.   However, she is not having any overt dyspnea.  The mean gradient was 29.  I am going to follow this clinically and it may be slightly difficult to sort out difficulties with breathing related to obesity, deconditioning, lung disease versus AS.  I will plan to get an echocardiogram in June.  Dyslipidemia - She has been slowly increasing Zestril dose.  Her LDL 74 and HDL 33.  She is going to try to get up to 20 mg once daily of the Crestor. had a low vitamin Delgado level and I will repeat this.   Hypertension  Her blood pressure is  slightly elevated but this is unusual.  She is keeping an eye on it at home.   Morbid obesity We talked about exercises that she might be able to do while water aerobics or close down.   Covid Education She thinks she might get the vaccine.  We talked about this today.   Current medicines are reviewed at length with the patient today.  The patient does not have concerns regarding medicines.  The following changes have been made:  no change  Labs/ tests ordered today include:   Orders Placed This Encounter  Procedures  . CBC  . ECHOCARDIOGRAM COMPLETE     Disposition:   FU with me  in six months.     Signed, Rollene Rotunda, MD  11/20/2019 11:35 AM    [facility]

## 2019-11-20 ENCOUNTER — Encounter: Payer: Self-pay | Admitting: Cardiology

## 2019-11-20 ENCOUNTER — Ambulatory Visit: Payer: Medicare Other | Admitting: Cardiology

## 2019-11-20 ENCOUNTER — Other Ambulatory Visit: Payer: Self-pay

## 2019-11-20 VITALS — BP 126/62 | HR 66 | Ht 68.0 in | Wt 285.0 lb

## 2019-11-20 DIAGNOSIS — I421 Obstructive hypertrophic cardiomyopathy: Secondary | ICD-10-CM | POA: Diagnosis not present

## 2019-11-20 DIAGNOSIS — E785 Hyperlipidemia, unspecified: Secondary | ICD-10-CM | POA: Diagnosis not present

## 2019-11-20 DIAGNOSIS — I10 Essential (primary) hypertension: Secondary | ICD-10-CM

## 2019-11-20 DIAGNOSIS — Z7189 Other specified counseling: Secondary | ICD-10-CM

## 2019-11-20 DIAGNOSIS — I350 Nonrheumatic aortic (valve) stenosis: Secondary | ICD-10-CM | POA: Diagnosis not present

## 2019-11-20 DIAGNOSIS — I480 Paroxysmal atrial fibrillation: Secondary | ICD-10-CM | POA: Diagnosis not present

## 2019-11-20 MED ORDER — APIXABAN 5 MG PO TABS: 5.0000 mg | ORAL_TABLET | Freq: Two times a day (BID) | ORAL | 3 refills | Status: DC

## 2019-11-20 NOTE — Patient Instructions (Signed)
Medication Instructions:  Your physician has recommended you make the following change in your medication:   STOP TAKING YOUR ASPIRIN  START TAKING APIXABAN (ELIQUIS) 5 MG. ONE TABLET BY MOUTH TWICE A DAY  *If you need a refill on your cardiac medications before your next appointment, please call your pharmacy*  Lab Work: Your physician recommends that you have your Primary Care Provider get lab work in 4 weeks:  Complete Blood Count  If you have any lab test that is abnormal or we need to change your treatment, we will call you to review the results.  Testing/Procedures: Your physician has requested that you have an echocardiogram. Echocardiography is a painless test that uses sound waves to create images of your heart. It provides your doctor with information about the size and shape of your heart and how well your heart's chambers and valves are working. This procedure takes approximately one hour. There are no restrictions for this procedure. LOCATION: [facility]: [address], [place], [place] [address]  DUE June 2021    Follow-Up: At [facility], you and your health needs are our priority.  As part of our continuing misson to provide you with exceptional heart care, we have created designated Provider Care Teams.  These Care Teams include your primary Cardiologist (physician) and Advanced Practice Providers (APPs -  Physician Assistants and Nurse Practitioners) who all work together to provide you with the care you need, when you need it.  Your next appointment:   6 month(s) PLEASE COMPLETE YOUR ECHOCARDIOGRAM BEFORE THIS APPOINTMENT   The format for your next appointment:   In Person  Provider:   Minus Breeding, MD

## 2019-11-23 ENCOUNTER — Inpatient Hospital Stay: Admission: RE | Admit: 2019-11-23 | Payer: Medicare Other | Source: Ambulatory Visit

## 2019-11-23 ENCOUNTER — Ambulatory Visit
Admission: RE | Admit: 2019-11-23 | Discharge: 2019-11-23 | Disposition: A | Discharge status: Home / Self Care | Payer: Medicare Other | Source: Ambulatory Visit | Attending: Internal Medicine | Admitting: Internal Medicine

## 2019-11-23 DIAGNOSIS — G4733 Obstructive sleep apnea (adult) (pediatric): Secondary | ICD-10-CM

## 2019-11-23 DIAGNOSIS — J439 Emphysema, unspecified: Secondary | ICD-10-CM

## 2019-11-23 DIAGNOSIS — R911 Solitary pulmonary nodule: Secondary | ICD-10-CM

## 2019-11-28 ENCOUNTER — Telehealth: Payer: Self-pay | Admitting: Nurse Practitioner

## 2019-11-28 NOTE — Telephone Encounter (Signed)
   Pt w/ h/o PAF, recently started on Eliquis.  After taking a nap this afternoon, she stood and felt lightheaded, as though she might lose consciousness.  This passed w/in a few secs and was followed by mild abd discomfort and she then had to rush to the bathroom to have a BM.  No melena/BRBPR.  No associated palpitations, c/p, or dyspnea.  No recurrent symptoms.  I rec that she increase hydration tonight.  If she has any recurrent lightheadedness, she will notify us as we would likely place a monitor at that point and arrange for in-office f/u.  Caller verbalized understanding and was grateful for the call back.  Murray Hodgkins, NP 11/28/2019, 5:15 PM

## 2020-01-01 ENCOUNTER — Ambulatory Visit: Payer: Medicare Other

## 2020-01-05 ENCOUNTER — Telehealth: Payer: Self-pay | Admitting: Internal Medicine

## 2020-01-05 NOTE — Telephone Encounter (Signed)
Patient called to advise that she had scan on 11/23/2019 and wants to know if the results from that scan are something that needs to be addressed.  Please advised patient at [phone]

## 2020-01-05 NOTE — Telephone Encounter (Signed)
Chart reviewed.  PCP ordered the CT for lung nodules but there was mention of a thyroid finding:  2. Low lesion in the LEFT lobe of thyroid gland. Consider further evaluation with thyroid ultrasound. If patient is clinically hyperthyroid, consider nuclear medicine thyroid uptake and scan.

## 2020-01-05 NOTE — Telephone Encounter (Signed)
Noted.  We can check a thyroid ultrasound at next visit, no problem.

## 2020-01-06 ENCOUNTER — Other Ambulatory Visit: Payer: Self-pay

## 2020-01-06 MED ORDER — METFORMIN HCL 500 MG PO TABS: 500.0000 mg | ORAL_TABLET | Freq: Two times a day (BID) | ORAL | 3 refills | Status: DC

## 2020-01-09 ENCOUNTER — Ambulatory Visit: Payer: Medicare Other | Attending: Internal Medicine

## 2020-01-09 DIAGNOSIS — Z23 Encounter for immunization: Secondary | ICD-10-CM | POA: Insufficient documentation

## 2020-01-09 NOTE — Progress Notes (Signed)
   Covid-19 Vaccination Clinic  Name:  Victoria Delgado    MRN: [id number] DOB: 04/23/1941  01/09/2020  Victoria Delgado was observed post Covid-19 immunization for 15 minutes without incidence. She was provided with Vaccine Information Sheet and instruction to access the V-Safe system.   Victoria Delgado was instructed to call 911 with any severe reactions post vaccine: Marland Kitchen Difficulty breathing  . Swelling of your face and throat  . A fast heartbeat  . A bad rash all over your body  . Dizziness and weakness    Immunizations Administered    Name Date Dose VIS Date Route   Pfizer COVID-19 Vaccine 01/09/2020  5:51 PM 0.3 mL 11/13/2019 Intramuscular   Manufacturer: [employer], [employer]   Lot: [id number]   NDC: [address]-8

## 2020-01-22 ENCOUNTER — Ambulatory Visit: Payer: Medicare Other

## 2020-01-30 ENCOUNTER — Other Ambulatory Visit: Payer: Self-pay | Admitting: Cardiology

## 2020-02-03 ENCOUNTER — Ambulatory Visit: Payer: Medicare Other | Attending: Internal Medicine

## 2020-02-03 DIAGNOSIS — Z23 Encounter for immunization: Secondary | ICD-10-CM | POA: Insufficient documentation

## 2020-02-03 NOTE — Progress Notes (Signed)
   Covid-19 Vaccination Clinic  Name:  Victoria Delgado    MRN: [id number] DOB: 04-Jun-1941  02/03/2020  Victoria Delgado was observed post Covid-19 immunization for 15 minutes without incident. She was provided with Vaccine Information Sheet and instruction to access the V-Safe system.   Victoria Delgado was instructed to call 911 with any severe reactions post vaccine: Marland Kitchen Difficulty breathing  . Swelling of face and throat  . A fast heartbeat  . A bad rash all over body  . Dizziness and weakness   Immunizations Administered    Name Date Dose VIS Date Route   Pfizer COVID-19 Vaccine 02/03/2020  3:06 PM 0.3 mL 11/13/2019 Intramuscular   Manufacturer: [employer], [employer]   Lot: [id number]   NDC: [address]-7

## 2020-02-20 ENCOUNTER — Other Ambulatory Visit: Payer: Self-pay | Admitting: Cardiology

## 2020-02-24 ENCOUNTER — Other Ambulatory Visit: Payer: Self-pay

## 2020-02-24 ENCOUNTER — Other Ambulatory Visit (INDEPENDENT_AMBULATORY_CARE_PROVIDER_SITE_OTHER): Payer: Medicare Other

## 2020-02-24 DIAGNOSIS — E278 Other specified disorders of adrenal gland: Secondary | ICD-10-CM

## 2020-02-24 DIAGNOSIS — R7303 Prediabetes: Secondary | ICD-10-CM

## 2020-02-24 LAB — HEMOGLOBIN A1C: Hgb A1c MFr Bld: 5.9 % (ref 4.6–6.5)

## 2020-02-24 LAB — POTASSIUM: Potassium: 4 mEq/L (ref 3.5–5.1)

## 2020-02-29 ENCOUNTER — Other Ambulatory Visit: Payer: Self-pay

## 2020-02-29 ENCOUNTER — Other Ambulatory Visit: Payer: Medicare Other

## 2020-02-29 DIAGNOSIS — E278 Other specified disorders of adrenal gland: Secondary | ICD-10-CM

## 2020-03-01 ENCOUNTER — Encounter: Payer: Self-pay | Admitting: Internal Medicine

## 2020-03-03 ENCOUNTER — Other Ambulatory Visit: Payer: Self-pay

## 2020-03-03 LAB — CORTISOL, URINE, 24 HOUR
24 Hour urine volume (VMAHVA): 3000 mL
Cortisol (Ur), Free: 14.4 mcg/24 h (ref 4.0–50.0)
RESULTS RECEIVED: 1.38 g/(24.h) (ref 0.50–2.15)

## 2020-03-03 LAB — EXTRA URINE SPECIMEN

## 2020-03-07 ENCOUNTER — Encounter: Payer: Self-pay | Admitting: Internal Medicine

## 2020-03-07 ENCOUNTER — Ambulatory Visit: Payer: Medicare Other | Admitting: Internal Medicine

## 2020-03-07 ENCOUNTER — Other Ambulatory Visit: Payer: Self-pay

## 2020-03-07 VITALS — BP 120/60 | HR 74 | Ht 68.0 in | Wt 263.0 lb

## 2020-03-07 DIAGNOSIS — E041 Nontoxic single thyroid nodule: Secondary | ICD-10-CM

## 2020-03-07 DIAGNOSIS — E278 Other specified disorders of adrenal gland: Secondary | ICD-10-CM | POA: Diagnosis not present

## 2020-03-07 DIAGNOSIS — R7303 Prediabetes: Secondary | ICD-10-CM | POA: Diagnosis not present

## 2020-03-07 NOTE — Progress Notes (Addendum)
Patient ID: Victoria Delgado, female   DOB: 14-Oct-1941, 79 y.o.   MRN: [id number]   This visit occurred during the SARS-CoV-2 public health emergency.  Safety protocols were in place, including screening questions prior to the visit, additional usage of staff PPE, and extensive cleaning of exam room while observing appropriate contact time as indicated for disinfecting solutions.   HPI  Victoria Delgado is a 79 y.o.-year-old female, returning for follow-up for a R adrenal incidentaloma and prediabetes.  Last visit was 6 months ago (virtual)  Adrenal incidentaloma Patient's adrenal mass was incidentally found during investigation for hemorrhagic cystitis.  She has a long standing history of back pain.  I reviewed patient's previous imaging records: Abdominal CT (06/22/2017): 2.5 cm R adrenal nodule, 15 HU, not present in 2004; likely adenoma. Abdominal MRI (07/24/2017): hypointense nodule with homogeneous T2 signal and signal dropout consistent with benign adenoma Adrenal CT  (09/08/2019): Stable adrenal mass  Reviewed previous renal investigation: Normal except for a slightly elevated normetanephrine in 11/2018 (nonspecific): Component     Latest Ref Rng & Units 02/24/2020 02/29/2020  Metanephrine, Pl     <=57 pg/mL <25   Normetanephrine, Pl     <=148 pg/mL 90   Total Metanephrines-Plasma     <=205 pg/mL 90   ALDOSTERONE     * ng/dL 7   Renin Activity     0.25 - 5.82 ng/mL/h 0.97   ALDO / PRA Ratio     0.9 - 28.9 Ratio 7.2   24 Hour urine volume (VMAHVA)     mL  3,000  Cortisol (Ur), Free     4.0 - 50.0 mcg/24 h  14.4  Results received     0.50 - 2.15 g/24 h  1.38  Potassium     3.5 - 5.1 mEq/L 4.0     Component     Latest Ref Rng & Units 11/07/2018  24 Hour urine volume (VMAHVA)     mL 2,150  Cortisol (Ur), Free     4.0 - 50.0 mcg/24 h 12.0  Results received     0.50 - 2.15 g/24 h 1.51  Extra Urine Specimen        Creatinine, 24H Ur     0.50 - 2.15 g/24 h 1.51   Component      Latest Ref Rng & Units 11/05/2018  Epinephrine     pg/mL 27  Norepinephrine     pg/mL 842  Dopamine     pg/mL see note  Catecholamines, Total     pg/mL 869  Metanephrine, Pl     <=57 pg/mL <25  Normetanephrine, Pl     <=148 pg/mL 183 (H)  Total Metanephrines-Plasma     <=205 pg/mL 183  Potassium     3.5 - 5.1 mEq/L 4.3    Component     Latest Ref Rng & Units 11/05/2018  ALDOSTERONE      ng/dL 10  Renin Activity     0.25 - 5.82 ng/mL/h 1.24  ALDO / PRA Ratio     0.9 - 28.9 Ratio 8.1   Previously: Component     Latest Ref Rng & Units 11/05/2017 11/08/2017  Epinephrine     pg/mL see note   Norepinephrine     pg/mL 471   Dopamine     pg/mL see note   Catecholamines, Total     pg/mL 471   Aldosterone, Serum      ng/dL 4   Renin Activity  0.25 - 5.82 ng/mL/h 0.88   ALDO / PRA Ratio     0.9 - 28.9 Ratio 4.5   Metanephrine, Pl     <=57 pg/mL 27   Normetanephrine, Pl     <=148 pg/mL 134   Total Metanephrines-Plasma     <=205 pg/mL 161   24 Hour urine volume (VMAHVA)     mL  2,250  Cortisol (Ur), Free     4.0 - 50.0 mcg/24 h  14.4  Results received     0.50 - 2.15 g/24 h  1.46  Potassium     3.5 - 5.1 mEq/L 4.5    She has a history of HTN and aortic stenosis.  She is on metoprolol, Cardizem, furosemide.  Prediabetes: -Diagnosed after starting statins  Reviewed HbA1c levels: Lab Results  Component Value Date   HGBA1C 5.9 02/24/2020   HGBA1C 6.1 (A) 11/05/2018  2019: HbA1c: 6.3%.  She is checking sugars 0 to once a day: - am: 130-140 >> 135-150, 160 >> 100-110 - 2h after b'fast: n/c >> 138 - lunch: n/c >> 94-112 - 2h after lunch: n/c  - dinner: 102-107 >> 85, 91-101 - 2h after dinner: n/c >> 96-125 - bedtime: n/c  No CKD: Lab Results  Component Value Date   BUN 16 06/09/2019   Lab Results  Component Value Date   CREATININE 0.82 06/09/2019   + HL: Lab Results  Component Value Date   CHOL 168 06/02/2019   HDL 39 (L) 06/02/2019    LDLCALC 87 06/02/2019   TRIG 212 (H) 06/02/2019   CHOLHDL 4.3 06/02/2019  She is on Crestor 10 mg daily and still has muscle aches and ankle pain.  She feels that this is increasing her blood sugars.  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  Latest Delgado exam 11/2018:? DR. She had a history of 3 episodes of retinal detachment.  She had a steroid inj for bursitis ~2008, not since.  Steroids caused her A. fib in the past.  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  She had unstable angina and had a cardiac cath in 03/2018.  She was swimming before but the aquatic center is now closed due to the coronavirus pandemic.  She has a L thyroid nodule per recent chest CT (11/23/2019).   + FH of thyroid ds. In sisters and mother. No FH of thyroid cancer. No h/o radiation tx to head or neck.  Pt denies: - feeling nodules in neck - hoarseness - dysphagia - choking - SOB with lying down  ROS: Constitutional: no weight gain/+ weight loss, no fatigue, no subjective hyperthermia, no subjective hypothermia Eyes: no blurry vision, no xerophthalmia ENT: no sore throat,  + see HPI Cardiovascular: no CP/no SOB/no palpitations/no leg swelling Respiratory: no cough/no SOB/no wheezing Gastrointestinal: no N/no V/no D/no C/no acid reflux Musculoskeletal: no muscle aches/no joint aches Skin: no rashes, no hair loss Neurological: no tremors/no numbness/no tingling/no dizziness  I reviewed pt's medications, allergies, PMH, social hx, family hx, and changes were documented in the history of present illness. Otherwise, unchanged from my initial visit note.  Past Medical History:  Diagnosis Date  . Arthritis    osteoarthritis-hips,knees, back, hands  . Asymmetric septal hypertrophy (HCC)   . Atrial fibrillation ([place])   . CAD (coronary artery disease)    2 drug-eluting stents placed in the circumflex leading into a marginal.  May 2017.  [name].   catheterization  on 03/19/2018, this showed patent stent in the proximal to mid left circumflex artery, 20% ostial left circumflex artery disease, 40% OM 3 disease, widely patent stent in the ostial D1, 20% ostial to proximal LAD disease, 20% mid LAD disease.  . Cataract    corrective surgery done  . Fatty liver   . Gallstones   . GERD (gastroesophageal reflux disease)   . Heart palpitations   . Hepatitis    hepatitis A; past hx.,many yrs ago ? food source  . Hernia, hiatal   . Hyperlipidemia   . Hypertension   . Obesity   . Sleep apnea    CPAP   Past Surgical History:  Procedure Laterality Date  . APPENDECTOMY  2004  . CATARACT EXTRACTION W/ INTRAOCULAR LENS IMPLANT     both eyes  . CHOLECYSTECTOMY    . LEFT HEART CATH AND CORONARY ANGIOGRAPHY N/A 03/19/2018   Procedure: LEFT HEART CATH AND CORONARY ANGIOGRAPHY;  Surgeon: Kathleene Hazel, MD;  Location: [facility];  Service: Cardiovascular;  Laterality: N/A;  . NM MYOCAR PERF WALL MOTION  08/15/04   No ischemia  . RETINAL DETACHMENT SURGERY Bilateral    remains with slight hazy vision with lights  . TOTAL HIP ARTHROPLASTY Right 08/04/2014   Procedure: RIGHT TOTAL HIP ARTHROPLASTY ANTERIOR APPROACH;  Surgeon: Loanne Drilling, MD;  Location: [facility];  Service: Orthopedics;  Laterality: Right;  . TOTAL HIP ARTHROPLASTY Left 01/05/2015   Procedure: LEFT TOTAL HIP ARTHROPLASTY ANTERIOR APPROACH;  Surgeon: Loanne Drilling, MD;  Location: [facility];  Service: Orthopedics;  Laterality: Left;  . US ECHOCARDIOGRAPHY  06/25/2012   Moderate ASH,LV hyperdynamic,LA is mod. dilated,trace MR,TR,AI   Social History   Socioeconomic History  . Marital status: Married    Spouse name: Not on file  . Number of children: 2  . Years of education: Not on file  . Highest education level: Not on file  Occupational History    Employer: OTHER  Tobacco Use  . Smoking status: Former Games developer  . Smokeless tobacco: Never Used  . Tobacco comment: QUIT IN 1990   Substance and Sexual Activity  . Alcohol use: No    Alcohol/week: 0.0 standard drinks  . Drug use: No  . Sexual activity: Yes  Other Topics Concern  . Not on file  Social History Narrative   Two grandchildren.            Social Determinants of Health   Financial Resource Strain:   . Difficulty of Paying Living Expenses:   Food Insecurity:   . Worried About Programme researcher, broadcasting/film/video in the Last Year:   . Barista in the Last Year:   Transportation Needs:   . Freight forwarder (Medical):   Marland Kitchen Lack of Transportation (Non-Medical):   Physical Activity:   . Days of Exercise per Week:   . Minutes of Exercise per Session:   Stress:   . Feeling of Stress :   Social Connections:   . Frequency of Communication with Friends and Family:   . Frequency of Social Gatherings with Friends and Family:   . Attends Religious Services:   . Active Member of Clubs or Organizations:   . Attends Banker Meetings:   Marland Kitchen Marital Status:   Intimate Partner Violence:   . Fear of Current or Ex-Partner:   . Emotionally Abused:   Marland Kitchen Physically Abused:   . Sexually Abused:    Current Outpatient Medications on File  Prior to Visit  Medication Sig Dispense Refill  . apixaban (ELIQUIS) 5 MG TABS tablet Take 1 tablet (5 mg total) by mouth 2 (two) times daily. 60 tablet 3  . CARTIA XT 240 MG 24 hr capsule TAKE 1 CAPSULE(240 MG) BY MOUTH AT BEDTIME (Patient taking differently: Take 240 mg by mouth at bedtime. ) 90 capsule 3  . Cholecalciferol (VITAMIN D) 2000 units CAPS Take 1,000-2,000 Units by mouth See admin instructions. Take 2000 units in the morning and 1000 units in the evening    . Cinnamon 500 MG capsule Take 1,000 mg by mouth daily.    . CRESTOR 20 MG tablet TAKE 1 TABLET(20 MG) BY MOUTH DAILY 90 tablet 3  . famotidine (PEPCID) 20 MG tablet Take 20 mg by mouth at bedtime.    . furosemide (LASIX) 40 MG tablet TAKE 1 TABLET BY MOUTH EVERY DAY WITH POTASSIUM PILL 90 tablet 3  .  loteprednol (LOTEMAX) 0.5 % ophthalmic suspension Place 1 drop into the left Delgado daily as needed (dry eyes).    . metFORMIN (GLUCOPHAGE) 500 MG tablet Take 1 tablet (500 mg total) by mouth 2 (two) times daily with a meal. 180 tablet 3  . metoprolol succinate (TOPROL XL) 50 MG 24 hr tablet TAKE 1 TABLET BY MOUTH EVERY MORNING AND 1/2 TABLET EVERY EVENING 135 tablet 1  . nitroGLYCERIN (NITROSTAT) 0.4 MG SL tablet Place 1 tablet (0.4 mg total) under the tongue every 5 (five) minutes as needed for chest pain. 25 tablet 3   No current facility-administered medications on file prior to visit.   Allergies  Allergen Reactions  . Other Other (See Comments)    Gas bubble [id number] has green bracelet **  . Codeine Nausea Only  . Kenalog [Triamcinolone Acetonide] Other (See Comments)    Had A.fib after she received the injection."Steroid meds"  . Relafen [Nabumetone] Other (See Comments)    Edema   . Penicillins Rash    40 years ago, injection site was red and swollen.  rash, facial/tongue/throat swelling, SOB or lightheadedness with hypotension: Yes Has patient had a PCN reaction causing immediate  Has patient had a PCN reaction causing severe rash involving mucus membranes or skin necrosis: NO Has patient had a PCN reaction that required hospitalization. No  Has patient had a PCN reaction occurring within the last 10 years: No If all of the above answers are "NO", then may proceed with Cephalosporin use.    . Tramadol Other (See Comments)    Auditory halllucinations   Family History  Problem Relation Age of Onset  . Hypertension Mother   . CVA Mother   . Lung cancer Father 72       asbestos exposure  . Cancer Father   . Esophageal cancer Other        nephew  . Diabetes Other        nephew  . Colon cancer Neg Hx   . Pancreatic cancer Neg Hx   . Kidney disease Neg Hx   . Liver disease Neg Hx    PE: BP 120/60   Pulse 74   Ht 5\' 8"  (1.727 m)   Wt 263 lb (119.3 kg)   SpO2 97%   BMI  39.99 kg/m  Wt Readings from Last 3 Encounters:  03/07/20 263 lb (119.3 kg)  11/20/19 285 lb (129.3 kg)  10/01/19 288 lb 9.6 oz (130.9 kg)   Constitutional: overweight, in NAD Eyes: PERRLA, EOMI, no exophthalmos ENT: moist mucous membranes, no  thyromegaly, no cervical lymphadenopathy Cardiovascular: RRR, No RG, +2/6 SEM Respiratory: CTA B Gastrointestinal: abdomen soft, NT, ND, BS+ Musculoskeletal: no deformities, strength intact in all 4 Skin: moist, warm, no rashes Neurological: no tremor with outstretched hands, DTR normal in all 4  ASSESSMENT: 1. R Adrenal incidentaloma  2. Obesity class 3  3.  Prediabetes  4. L Thyroid nodule  PLAN:  1. Patient with a right-sided adrenal nodule, discovered incidentally -We discussed in the past that there are 3 possible scenarios:: -A nonfunctioning adrenal nodule -A functioning adrenal adenoma- which can hypersecrete catecholamines/metanephrines, cortisol, or aldosterone -Adrenal cancer or metastases. -Reviewed previous investigation for the nodule:  -She refused a dexamethasone suppression test in the past but she mentioned that she was very sensitive to exogenous steroids.  The 24-hour urine cortisol was performed to rule out Cushing syndrome (6% of adrenal incidentaloma) and this was normal  -We checked fractionated metanephrines and catecholamines to rule out pheochromocytoma (3% of adrenal incidentaloma's) and these were normal, but in the past she did have a slightly high normetanephrine.  I explained that this is a nonspecific finding.  Most recent level was normal few days ago.  -We checked plasma renin activity and aldosterone level to rule out primary hyperaldosteronism (0.6% of adrenal incidentalomas) and these were normal  -I explained to the best indicator that her nodule is not cancerous is lack of change in size and appearance over time.  The nodule did not appear to present on the CT of the abdomen from 2004, but, per my  review of the images, it is possible that the right adrenal was not well visualized at the time.  She had imaging of her adrenals in 2018 and another dedicated adrenal CT abdomen on 09/08/2019, recently, which showed a stable adrenal mass.  No further imaging tests are necessary to investigate her adrenal nodule. -To ensure that the nodule functions normally, we need to repeat annual hormonal investigation for 5 years  2. Obesity class 3 -I suggested a plant-based diet in the past.  She was interested in this but did not start -lost 30 lbs since last visit  3.  Prediabetes -Previously, HbA1c was 6.1% but this improved to 5.9% last month.  She improved her diet significantly and she does not want to start Metformin.  Sugars improved and they are almost all at goal.  She checks at different times of the day. -At last visit, we discussed about the fact that prediabetes is a reversible condition by controlling her diet (limiting fatty foods and concentrated sweets).  I also suggested exercise.  Of note, she cannot perform weightbearing exercises due to joint pain. -No need to start Metformin at this visit, will keep an Delgado on her blood sugars for now.  4.  L Thyroid nodule -Incidentally found on CT chest -Not palpable on exam today -No neck compression symptoms -She has family history of goiter and hypothyroidism, but no thyroid cancer -She denies radiotherapy to head or neck -We will check a thyroid ultrasound  Component     Latest Ref Rng & Units 02/24/2020  Epinephrine     pg/mL 30  Norepinephrine     pg/mL 574  Dopamine     pg/mL 15  Total Catecholamines     pg/mL 619  Catecholamines are also back and they are normal.   Thyroid ultrasound (03/29/2020): Parenchymal Echotexture: Normal Isthmus: 0.2 cm Right lobe: 4.1 cm x 1.1 cm x 1.5 cm Left lobe: 3.6 cm x 2.5 cm x  2.3 cm _________________________________________________________  Nodule # 1: Location: Left; Mid Maximum size: 3.0  cm; Other 2 dimensions: 2.1 cm x 1.9 cm Composition: solid/almost completely solid (2) Echogenicity: hypoechoic (2) Echogenic foci: punctate echogenic foci (3) Nodule meets criteria for biopsy ________________________________________________________  No adenopathy  IMPRESSION: Left thyroid nodule (labeled 1, 3.0 cm) meets criteria for biopsy, as designated by the newly established ACR TI-RADS criteria, and referral for biopsy is recommended.  We will suggest FNA of the above nodule.  04/08/2020: FNA: inconclusive: Clinical History: None provided  Specimen Submitted: A. THYROID, LMP, FINE NEEDLE ASPIRATION:    FINAL MICROSCOPIC DIAGNOSIS:  - Scant follicular epithelium present (Bethesda category I)   SPECIMEN ADEQUACY:  Satisfactory but limited for evaluation, scant cellularity   Will need to repeat the Bx in 6 months.  Carlus Pavlov, MD PhD [facility]

## 2020-03-07 NOTE — Patient Instructions (Addendum)
We will schedule a thyroid U/S downstairs in [facility].  Please come back for a follow-up appointment in 12 months.

## 2020-03-10 LAB — CATECHOLAMINES, FRACTIONATED, PLASMA
Dopamine: 15 pg/mL
Epinephrine: 30 pg/mL
Norepinephrine: 574 pg/mL
Total Catecholamines: 619 pg/mL

## 2020-03-10 LAB — METANEPHRINES, PLASMA
Metanephrine, Free: <25 pg/mL (ref <=57)
Normetanephrine, Free: 90 pg/mL (ref <=148)
Total Metanephrines-Plasma: 90 pg/mL (ref <=205)

## 2020-03-10 LAB — ALDOSTERONE + RENIN ACTIVITY W/ RATIO
ALDO / PRA Ratio: 7.2 Ratio (ref 0.9–28.9)
Aldosterone: 7 ng/dL
Renin Activity: 0.97 ng/mL/h (ref 0.25–5.82)

## 2020-03-12 ENCOUNTER — Other Ambulatory Visit: Payer: Self-pay | Admitting: Cardiology

## 2020-03-12 DIAGNOSIS — I480 Paroxysmal atrial fibrillation: Secondary | ICD-10-CM

## 2020-03-14 DIAGNOSIS — Z79899 Other long term (current) drug therapy: Secondary | ICD-10-CM

## 2020-03-17 LAB — BASIC METABOLIC PANEL
BUN/Creatinine Ratio: 21 (ref 12–28)
BUN: 19 mg/dL (ref 8–27)
CO2: 24 mmol/L (ref 20–29)
Calcium: 9.3 mg/dL (ref 8.7–10.3)
Chloride: 105 mmol/L (ref 96–106)
Creatinine, Ser: 0.89 mg/dL (ref 0.57–1.00)
GFR calc Af Amer: 73 mL/min/{1.73_m2} (ref >59)
GFR calc non Af Amer: 63 mL/min/{1.73_m2} (ref >59)
Glucose: 110 mg/dL — ABNORMAL HIGH (ref 65–99)
Potassium: 4.9 mmol/L (ref 3.5–5.2)
Sodium: 145 mmol/L — ABNORMAL HIGH (ref 134–144)

## 2020-03-17 LAB — CBC
Hematocrit: 34 % (ref 34.0–46.6)
Hemoglobin: 11.3 g/dL (ref 11.1–15.9)
MCH: 30.5 pg (ref 26.6–33.0)
MCHC: 33.2 g/dL (ref 31.5–35.7)
MCV: 92 fL (ref 79–97)
Platelets: 262 10*3/uL (ref 150–450)
RBC: 3.71 x10E6/uL — ABNORMAL LOW (ref 3.77–5.28)
RDW: 14.4 % (ref 11.7–15.4)
WBC: 10.4 10*3/uL (ref 3.4–10.8)

## 2020-03-29 ENCOUNTER — Encounter: Payer: Self-pay | Admitting: Internal Medicine

## 2020-03-29 ENCOUNTER — Ambulatory Visit
Admission: RE | Admit: 2020-03-29 | Discharge: 2020-03-29 | Disposition: A | Discharge status: Home / Self Care | Payer: Medicare Other | Source: Ambulatory Visit | Attending: Internal Medicine | Admitting: Internal Medicine

## 2020-03-29 DIAGNOSIS — E041 Nontoxic single thyroid nodule: Secondary | ICD-10-CM

## 2020-03-30 ENCOUNTER — Other Ambulatory Visit: Payer: Self-pay | Admitting: Internal Medicine

## 2020-03-30 ENCOUNTER — Other Ambulatory Visit: Payer: Self-pay | Admitting: Cardiology

## 2020-03-30 DIAGNOSIS — E041 Nontoxic single thyroid nodule: Secondary | ICD-10-CM

## 2020-04-07 ENCOUNTER — Other Ambulatory Visit (HOSPITAL_COMMUNITY): Admission: RE | Admit: 2020-04-07 | Payer: Medicare Other | Source: Ambulatory Visit

## 2020-04-07 ENCOUNTER — Other Ambulatory Visit (HOSPITAL_COMMUNITY)
Admission: RE | Admit: 2020-04-07 | Discharge: 2020-04-07 | Disposition: A | Discharge status: Home / Self Care | Payer: Medicare Other | Source: Ambulatory Visit | Attending: Internal Medicine | Admitting: Internal Medicine

## 2020-04-07 ENCOUNTER — Ambulatory Visit
Admission: RE | Admit: 2020-04-07 | Discharge: 2020-04-07 | Disposition: A | Discharge status: Home / Self Care | Payer: Medicare Other | Source: Ambulatory Visit | Attending: Internal Medicine | Admitting: Internal Medicine

## 2020-04-07 DIAGNOSIS — E041 Nontoxic single thyroid nodule: Secondary | ICD-10-CM | POA: Diagnosis present

## 2020-04-08 LAB — CYTOLOGY - NON PAP

## 2020-05-16 ENCOUNTER — Ambulatory Visit (HOSPITAL_COMMUNITY): Payer: Medicare Other | Attending: Cardiology

## 2020-05-16 ENCOUNTER — Other Ambulatory Visit: Payer: Self-pay

## 2020-05-16 DIAGNOSIS — I350 Nonrheumatic aortic (valve) stenosis: Secondary | ICD-10-CM

## 2020-05-16 DIAGNOSIS — I421 Obstructive hypertrophic cardiomyopathy: Secondary | ICD-10-CM | POA: Diagnosis not present

## 2020-05-16 DIAGNOSIS — I480 Paroxysmal atrial fibrillation: Secondary | ICD-10-CM | POA: Diagnosis present

## 2020-05-18 DIAGNOSIS — E785 Hyperlipidemia, unspecified: Secondary | ICD-10-CM | POA: Insufficient documentation

## 2020-05-18 DIAGNOSIS — Z7189 Other specified counseling: Secondary | ICD-10-CM | POA: Insufficient documentation

## 2020-05-18 NOTE — Progress Notes (Signed)
Cardiology Office Note   Date:  05/19/2020   ID:  Victoria Delgado, DOB 30-Jan-1941, MRN [id number]  PCP:  Sueanne Margarita, DO  Cardiologist:   Minus Breeding, MD   Chief Complaint  Patient presents with  . Aortic Stenosis      History of Present Illness: Victoria Delgado is a 79 y.o. female who presents for follow up of CAD.  She has atrial fibrillation which she ultimately thinks was related to a steroid injection. This was in 2008. At that time she was found to have asymmetric septal hypertrophy. This has been followed conservatively.  Echocardiogram did demonstrate concentric LV hypertrophy which was moderate and moderate LAE.  In May of 2017 she was in the hospital with CAD.  She was admitted to [facility] on 5/24 after waking up with left arm pain.  She had positive troponin with  PCI to the mid LCX and 1st DX. She is on DAPT with aspirin/Effient. She had 2 drug-eluting stents placed in the circumflex leading into a marginal. This was apparently somewhat small circumflex. She also had a diagonal had a drug-eluting stent placed.  An echo in our office showed well preserved ejection fraction. There was some moderate aortic stenosis. There was no mention of hypertrophic obstruction although there is LVH.  She was in the hospital in April 2019 with chest pain.    She did have positive cardiac enzymes.   She underwent cardiac catheterization on 03/19/2018, this showed patent stent in the proximal to mid left circumflex artery, 20% ostial left circumflex artery disease, 40% OM 3 disease, widely patent stent in the ostial D1, 20% ostial to proximal LAD disease, 20% mid LAD disease.  Cardiac catheterization did confirm moderate aortic stenosis with mean gradient of 26.7 mmHg.  Medical therapy was recommended for her coronary artery disease.  She had a follow up echo and her AS is severe.   She was in the ED in July with atrial fib with rapid rate.  She had an episode of presyncope recently and called  [place] to discuss.   She had a recent echo with her mean gradient said to be 67 mm with a jet at 4.86 m/s.  She had some recent dizziness.  I brought her back today to discuss this.  She absolutely denies any symptoms.  She says she is going to water aerobics.  She feels no shortness of breath.  She denies any PND or orthopnea.  Of note in March she had some dark black stools.  She was found to be guaiac positive.  Apparently there was some anemia although her current hemoglobin is improved.  She says her iron is still low and she has been placed on iron.  She was taken off the Eliquis.  She is not had any further tachypalpitations.  She has had no symptoms consistent with when she was in atrial fibrillation.   Past Medical History:  Diagnosis Date  . Arthritis    osteoarthritis-hips,knees, back, hands  . Asymmetric septal hypertrophy (HCC)   . Atrial fibrillation ([place])   . CAD (coronary artery disease)    2 drug-eluting stents placed in the circumflex leading into a marginal.  May 2017.  [name].   catheterization on 03/19/2018, this showed patent stent in the proximal to mid left circumflex artery, 20% ostial left circumflex artery disease, 40% OM 3 disease, widely patent stent in the ostial D1, 20% ostial to proximal LAD disease, 20% mid LAD disease.  Marland Kitchen  Cataract    corrective surgery done  . Fatty liver   . Gallstones   . GERD (gastroesophageal reflux disease)   . Heart palpitations   . Hepatitis    hepatitis A; past hx.,many yrs ago ? food source  . Hernia, hiatal   . Hyperlipidemia   . Hypertension   . Obesity   . Sleep apnea    CPAP    Past Surgical History:  Procedure Laterality Date  . APPENDECTOMY  2004  . CATARACT EXTRACTION W/ INTRAOCULAR LENS IMPLANT     both eyes  . CHOLECYSTECTOMY    . LEFT HEART CATH AND CORONARY ANGIOGRAPHY N/A 03/19/2018   Procedure: LEFT HEART CATH AND CORONARY ANGIOGRAPHY;  Surgeon: Kathleene Hazel, MD;  Location: [facility];   Service: Cardiovascular;  Laterality: N/A;  . NM MYOCAR PERF WALL MOTION  08/15/04   No ischemia  . RETINAL DETACHMENT SURGERY Bilateral    remains with slight hazy vision with lights  . TOTAL HIP ARTHROPLASTY Right 08/04/2014   Procedure: RIGHT TOTAL HIP ARTHROPLASTY ANTERIOR APPROACH;  Surgeon: Loanne Drilling, MD;  Location: [facility];  Service: Orthopedics;  Laterality: Right;  . TOTAL HIP ARTHROPLASTY Left 01/05/2015   Procedure: LEFT TOTAL HIP ARTHROPLASTY ANTERIOR APPROACH;  Surgeon: Loanne Drilling, MD;  Location: [facility];  Service: Orthopedics;  Laterality: Left;  . US ECHOCARDIOGRAPHY  06/25/2012   Moderate ASH,LV hyperdynamic,LA is mod. dilated,trace MR,TR,AI     Current Outpatient Medications  Medication Sig Dispense Refill  . albuterol (VENTOLIN HFA) 108 (90 Base) MCG/ACT inhaler SMARTSIG:2 Puff(s) By Mouth Every 4-6 Hours PRN    . Cholecalciferol (VITAMIN D) 2000 units CAPS Take 1,000-2,000 Units by mouth See admin instructions. Take 2000 units in the morning and 1000 units in the evening    . Cinnamon 500 MG capsule Take 1,000 mg by mouth daily.    . CRESTOR 20 MG tablet TAKE 1 TABLET(20 MG) BY MOUTH DAILY 90 tablet 3  . diltiazem (CARTIA XT) 240 MG 24 hr capsule Take 1 capsule (240 mg total) by mouth daily. 90 capsule 1  . famotidine (PEPCID) 20 MG tablet Take 20 mg by mouth at bedtime.    . furosemide (LASIX) 40 MG tablet TAKE 1 TABLET BY MOUTH EVERY DAY WITH POTASSIUM PILL 90 tablet 3  . loteprednol (LOTEMAX) 0.5 % ophthalmic suspension Place 1 drop into the left eye daily as needed (dry eyes).    . metoprolol succinate (TOPROL XL) 50 MG 24 hr tablet TAKE 1 TABLET BY MOUTH EVERY MORNING AND 1/2 TABLET EVERY EVENING 135 tablet 1  . nitroGLYCERIN (NITROSTAT) 0.4 MG SL tablet Place 1 tablet (0.4 mg total) under the tongue every 5 (five) minutes as needed for chest pain. 25 tablet 3   No current facility-administered medications for this visit.    Allergies:   Other, Codeine,  Kenalog [triamcinolone acetonide], Relafen [nabumetone], Penicillins, and Tramadol    ROS:  Please see the history of present illness.   Otherwise, review of systems are positive for none.   All other systems are reviewed and negative.    PHYSICAL EXAM: VS:  BP 130/80   Pulse (!) 56   Temp (!) 96.6 F (35.9 C)   Ht 5\' 8"  (1.727 m)   Wt 254 lb (115.2 kg)   SpO2 96%   BMI 38.62 kg/m  , BMI Body mass index is 38.62 kg/m. GENERAL:  Well appearing NECK:  No jugular venous distention, waveform within normal limits,  carotid upstroke brisk and symmetric, no bruits, no thyromegaly LUNGS:  Clear to auscultation bilaterally CHEST:  Unremarkable HEART:  PMI not displaced or sustained,S1 and S2 within normal limits, no S3, no S4, no clicks, no rubs, 3 out of 6 apical systolic late peaking murmur radiating out the aortic outflow tract, no diastolic murmurs ABD:  Flat, positive bowel sounds normal in frequency in pitch, no bruits, no rebound, no guarding, no midline pulsatile mass, no hepatomegaly, no splenomegaly EXT:  2 plus pulses throughout, trace edema, no cyanosis no clubbing   EKG:  EKG is  ordered today. Normal sinus rhythm, rate 56, left axis deviation, borderline voltage criteria for left ventricular hypertrophy, no acute ST-T wave changes.  Recent Labs: 06/02/2019: ALT 18 06/09/2019: Magnesium 2.3; TSH 3.139 03/16/2020: BUN 19; Creatinine, Ser 0.89; Hemoglobin 11.3; Platelets 262; Potassium 4.9; Sodium 145    Lipid Panel    Component Value Date/Time   CHOL 168 06/02/2019 1245   TRIG 212 (H) 06/02/2019 1245   HDL 39 (L) 06/02/2019 1245   CHOLHDL 4.3 06/02/2019 1245   CHOLHDL 5.1 03/17/2018 0034   VLDL 37 03/17/2018 0034   LDLCALC 87 06/02/2019 1245      Wt Readings from Last 3 Encounters:  05/19/20 254 lb (115.2 kg)  03/07/20 263 lb (119.3 kg)  11/20/19 285 lb (129.3 kg)      Other studies Reviewed: Additional studies/ records that were reviewed today include: Echo,  labs  Review of the above records demonstrates:  Please see elsewhere in the note.     ASSESSMENT AND PLAN:  ATRIAL FIB:   She has Victoria Delgado has a CHA2DS2 - VASc score 5.   Unfortunately she cannot take anticoagulation.  She did not see a GI doctor.  I am going to get the records from her primary provider and pursue this.  She not had a long conversation about the fact that if she has any recurrent tachypalpitations thought to be or consistent with A. fib I would want her to be back on anticoagulation and at that point she would need to see a GI doctor.  She really does not want to pursue gastroenterology work-up at this point.   CAD:  She is having no chest discomfort.  No further evaluation is indicated.  She will continue with risk reduction.   LVH- This was severe on the echo.  This is likely related to the aortic stenosis.  AS - This is severe by echo.   However, she says she is having absolutely no symptoms.  She really wants to defer any further management as she says she has been seeing so many doctors.  I Minna review this with our [facility] Team.  I Ernie Hew watch her very closely.  I think she is nearing need for intervention.   Dyslipidemia - LDL 74 and HDL 33.  To continue meds as listed.  Hypertension  Her blood pressure is  controlled.  She will continue meds as listed.    Covid Education She has had her vaccines.  Current medicines are reviewed at length with the patient today.  The patient does not have concerns regarding medicines.  The following changes have been made:  no change  Labs/ tests ordered today include:   Orders Placed This Encounter  Procedures  . EKG 12-Lead     Disposition:   FU with me in six months.     Signed, Rollene Rotunda, MD  05/19/2020 2:33 PM  [facility]

## 2020-05-19 ENCOUNTER — Other Ambulatory Visit: Payer: Self-pay

## 2020-05-19 ENCOUNTER — Ambulatory Visit (INDEPENDENT_AMBULATORY_CARE_PROVIDER_SITE_OTHER): Payer: Medicare Other | Admitting: Cardiology

## 2020-05-19 ENCOUNTER — Encounter: Payer: Self-pay | Admitting: Cardiology

## 2020-05-19 VITALS — BP 130/80 | HR 56 | Temp 96.6°F | Ht 68.0 in | Wt 254.0 lb

## 2020-05-19 DIAGNOSIS — E785 Hyperlipidemia, unspecified: Secondary | ICD-10-CM

## 2020-05-19 DIAGNOSIS — I517 Cardiomegaly: Secondary | ICD-10-CM

## 2020-05-19 DIAGNOSIS — I480 Paroxysmal atrial fibrillation: Secondary | ICD-10-CM | POA: Diagnosis not present

## 2020-05-19 DIAGNOSIS — I350 Nonrheumatic aortic (valve) stenosis: Secondary | ICD-10-CM

## 2020-05-19 DIAGNOSIS — Z7189 Other specified counseling: Secondary | ICD-10-CM

## 2020-05-19 NOTE — Patient Instructions (Signed)
Medication Instructions:  ELIQUIS REMOVED FROM MEDICATION LIST *If you need a refill on your cardiac medications before your next appointment, please call your pharmacy*  Lab Work: NONE ORDERED THIS VISIT  Testing/Procedures: NONE ORDERED THIS VISIT  Follow-Up: At [facility], you and your health needs are our priority.  As part of our continuing mission to provide you with exceptional heart care, we have created designated Provider Care Teams.  These Care Teams include your primary Cardiologist (physician) and Advanced Practice Providers (APPs -  Physician Assistants and Nurse Practitioners) who all work together to provide you with the care you need, when you need it.  Your next appointment:   FRI SEPT. 17TH AT 1:40PM  The format for your next appointment:   In Person  Provider:   Rollene Rotunda, MD

## 2020-06-13 ENCOUNTER — Telehealth: Payer: Self-pay | Admitting: Cardiology

## 2020-06-13 NOTE — Telephone Encounter (Signed)
Pt c/o Syncope: STAT if syncope occurred within 30 minutes and pt complains of lightheadedness High Priority if episode of passing out, completely, today or in last 24 hours   1. Did you pass out today? About an hour ago- she almost passed out-saw darkness- she was just sitting   2. When is the last time you passed out? This was herr first episode like this   3. Has this occurred multiple times? no   4. Did you have any symptoms prior to passing out?  No symptoms prior to this

## 2020-06-13 NOTE — Telephone Encounter (Signed)
Spoke to pt who report she had a frightening episode around 2 pm as she was sitting working on her computer. She report everything went black, she felt dizzy, and felt like she was going to pass out. Pt report she did not check BP and HR during that time and symptoms has since resolved.   Will route to MD for recommendations.

## 2020-06-13 NOTE — Telephone Encounter (Signed)
Katie and [place].  I wonder if I could ask for your help on this.  I will try to call the patient tomorrow to discuss.  She has very severe AS and did not want to consider referral or further treatment at this time because of "too many doctors appts."  I did have Dr. Excell Seltzer look at it.  He agrees critical AS. Now this event is even more concerning.  Can you get her the next available appt with [facility].  Please call her and tell her that that is really the next step.  I will also try to call her late tomorrow to discuss as well.  Thanks.  Dr. Antoine Poche

## 2020-06-14 NOTE — Telephone Encounter (Signed)
I spoke with the patient. We will get her in with Excell Seltzer at his first available. She is agreeable

## 2020-06-15 NOTE — Telephone Encounter (Signed)
Follow up scheduled with cooper 06-23-20.

## 2020-06-17 ENCOUNTER — Ambulatory Visit: Payer: Medicare Other | Admitting: Cardiovascular Disease

## 2020-06-23 ENCOUNTER — Encounter: Payer: Self-pay | Admitting: Cardiovascular Disease

## 2020-06-23 ENCOUNTER — Other Ambulatory Visit: Payer: Self-pay

## 2020-06-23 ENCOUNTER — Ambulatory Visit: Payer: Medicare Other | Admitting: Cardiovascular Disease

## 2020-06-23 VITALS — BP 140/62 | HR 66 | Ht 68.0 in | Wt 258.8 lb

## 2020-06-23 DIAGNOSIS — I350 Nonrheumatic aortic (valve) stenosis: Secondary | ICD-10-CM | POA: Diagnosis not present

## 2020-06-23 DIAGNOSIS — I480 Paroxysmal atrial fibrillation: Secondary | ICD-10-CM

## 2020-06-23 DIAGNOSIS — I2510 Atherosclerotic heart disease of native coronary artery without angina pectoris: Secondary | ICD-10-CM

## 2020-06-23 NOTE — Progress Notes (Signed)
Cardiology Office Note:    Date:  06/23/2020   ID:  Victoria Delgado, DOB May 04, 1941, MRN [id number]  PCP:  Victoria Ferretti, DO  [facility] Cardiologist:  Victoria Rotunda, MD  [facility] Electrophysiologist:  None   Referring MD: Victoria Ferretti, DO   Chief Complaint  Patient presents with  . Shortness of Breath    History of Present Illness:    Victoria Delgado is a 79 y.o. female referred by Victoria Delgado for evaluation of severe aortic stenosis.   She is here with her husband today. She has had a heart murmur since her 40's. She has a longstanding hx of heart palpitations since she was a teenager and was evaluated in the ER a few times in her early 58's, told of a heart murmur at that time. She was followed by Victoria Delgado for many years, and more recently by Victoria Delgado. She underwent PCI in [facility] in 2017 and was treated with 2 stents. Repeat cardiac catheterization in 2019 showed patency of her stent sites in the circumflex and first diagonal. She was noted to have moderate aortic stenosis at that time by cath measurements with mean transaortic gradient of 27 mmHg.   An echocardiogram was done in June 2020 showing normal LV systolic function with LVEF 60 to 65% and severe LVH.  Maximum transaortic velocity was 4.1 m/s with peak and mean gradients of 67 and 39 mmHg.  Dimensionless index was 0.4 and calculated valve area was 1.0 cm.  Surveillance echocardiogram May 16, 2020 showed no changes in LV function or LVH.  Aortic stenosis has progressed now with peak and mean gradients of 95 and 67 mmHg, respectively.  Peak transaortic velocity is now 4.9 m/s with a calculated valve area of 1.0 cm.  She does complain of shortness of breath, but she really doesn't appreciate any change over the past 3 years. She does water aerobics 3 days/week without limitation. Shortness of breath occurs with walking on flat ground. She was recently sitting down working on her computer and had an episode of  tunnel vision and associated presyncope. She hasn't had any other episodes and denies frank syncope. She denies exertional chest pain or pressure. Other physical limitation includes arthritis with low back pain, s/p bilateral hip replacement, and arthritis in her hands. She otherwise is very independent with specific problems. She has paroxysmal atrial fibrillation, first episode in 2008. She developed GI bleeding about one year ago with symptomatic anemia on Eliquis. States she was very short of breath with this. This was ultimately stopped and her anemia was treated with iron. She is feeling much better with normalization of her hemoglobin.   Past Medical History:  Diagnosis Date  . Arthritis    osteoarthritis-hips,knees, back, hands  . Asymmetric septal hypertrophy (HCC)   . Atrial fibrillation (HCC)   . CAD (coronary artery disease)    2 drug-eluting stents placed in the circumflex leading into a marginal.  May 2017.  [facility].   catheterization on 03/19/2018, this showed patent stent in the proximal to mid left circumflex artery, 20% ostial left circumflex artery disease, 40% OM 3 disease, widely patent stent in the ostial D1, 20% ostial to proximal LAD disease, 20% mid LAD disease.  . Cataract    corrective surgery done  . Fatty liver   . Gallstones   . GERD (gastroesophageal reflux disease)   . Heart palpitations   . Hepatitis    hepatitis A; past hx.,many yrs ago ?  food source  . Hernia, hiatal   . Hyperlipidemia   . Hypertension   . Obesity   . Sleep apnea    CPAP    Past Surgical History:  Procedure Laterality Date  . APPENDECTOMY  2004  . CATARACT EXTRACTION W/ INTRAOCULAR LENS IMPLANT     both eyes  . CHOLECYSTECTOMY    . LEFT HEART CATH AND CORONARY ANGIOGRAPHY N/A 03/19/2018   Procedure: LEFT HEART CATH AND CORONARY ANGIOGRAPHY;  Surgeon: Kathleene Hazel, MD;  Location: [facility];  Service: Cardiovascular;  Laterality: N/A;  . NM MYOCAR PERF WALL  MOTION  08/15/04   No ischemia  . RETINAL DETACHMENT SURGERY Bilateral    remains with slight hazy vision with lights  . TOTAL HIP ARTHROPLASTY Right 08/04/2014   Procedure: RIGHT TOTAL HIP ARTHROPLASTY ANTERIOR APPROACH;  Surgeon: Loanne Drilling, MD;  Location: [facility];  Service: Orthopedics;  Laterality: Right;  . TOTAL HIP ARTHROPLASTY Left 01/05/2015   Procedure: LEFT TOTAL HIP ARTHROPLASTY ANTERIOR APPROACH;  Surgeon: Loanne Drilling, MD;  Location: [facility] ORS;  Service: Orthopedics;  Laterality: Left;  . US ECHOCARDIOGRAPHY  06/25/2012   Moderate ASH,LV hyperdynamic,LA is mod. dilated,trace MR,TR,AI    Current Medications: Current Meds  Medication Sig  . albuterol (VENTOLIN HFA) 108 (90 Base) MCG/ACT inhaler SMARTSIG:2 Puff(s) By Mouth Every 4-6 Hours PRN  . Cholecalciferol (VITAMIN D) 2000 units CAPS Take 1,000-2,000 Units by mouth See admin instructions. Take 2000 units in the morning and 1000 units in the evening  . Cinnamon 500 MG capsule Take 1,000 mg by mouth daily.  . CRESTOR 20 MG tablet TAKE 1 TABLET(20 MG) BY MOUTH DAILY  . diltiazem (CARTIA XT) 240 MG 24 hr capsule Take 1 capsule (240 mg total) by mouth daily.  . famotidine (PEPCID) 20 MG tablet Take 20 mg by mouth at bedtime.  . Ferrous Sulfate (IRON) 142 (45 Fe) MG TBCR Take 1 tablet by mouth daily in the afternoon.  . furosemide (LASIX) 40 MG tablet TAKE 1 TABLET BY MOUTH EVERY DAY WITH POTASSIUM PILL  . loteprednol (LOTEMAX) 0.5 % ophthalmic suspension Place 1 drop into the left eye daily as needed (dry eyes).  . metoprolol succinate (TOPROL XL) 50 MG 24 hr tablet TAKE 1 TABLET BY MOUTH EVERY MORNING AND 1/2 TABLET EVERY EVENING  . nitroGLYCERIN (NITROSTAT) 0.4 MG SL tablet Place 1 tablet (0.4 mg total) under the tongue every 5 (five) minutes as needed for chest pain.     Allergies:   Other, Codeine, Kenalog [triamcinolone acetonide], Relafen [nabumetone], Penicillins, and Tramadol   Social History   Socioeconomic History    . Marital status: Married    Spouse name: Not on file  . Number of children: 2  . Years of education: Not on file  . Highest education level: Not on file  Occupational History    Employer: OTHER  Tobacco Use  . Smoking status: Former Games developer  . Smokeless tobacco: Never Used  . Tobacco comment: QUIT IN 1990  Vaping Use  . Vaping Use: Never used  Substance and Sexual Activity  . Alcohol use: No    Alcohol/week: 0.0 standard drinks  . Drug use: No  . Sexual activity: Yes  Other Topics Concern  . Not on file  Social History Narrative   Two grandchildren.            Social Determinants of Health   Financial Resource Strain:   . Difficulty of Paying Living Expenses:  Food Insecurity:   . Worried About Programme researcher, broadcasting/film/video in the Last Year:   . Barista in the Last Year:   Transportation Needs:   . Freight forwarder (Medical):   Marland Kitchen Lack of Transportation (Non-Medical):   Physical Activity:   . Days of Exercise per Week:   . Minutes of Exercise per Session:   Stress:   . Feeling of Stress :   Social Connections:   . Frequency of Communication with Friends and Family:   . Frequency of Social Gatherings with Friends and Family:   . Attends Religious Services:   . Active Member of Clubs or Organizations:   . Attends Banker Meetings:   Marland Kitchen Marital Status:      Family History: The patient's family history includes CVA in her mother; Cancer in her father; Diabetes in an other family member; Esophageal cancer in an other family member; Hypertension in her mother; Lung cancer (age of onset: 43) in her father. There is no history of Colon cancer, Pancreatic cancer, Kidney disease, or Liver disease.  ROS:   Please see the history of present illness.    All other systems reviewed and are negative.  EKGs/Labs/Other Studies Reviewed:    The following studies were reviewed today: Cardiac Cath 03/19/2018: Conclusion    Mid RCA lesion is 10%  stenosed.  Previously placed Prox Cx to Mid Cx stent (unknown type) is widely patent.  Ost Cx to Prox Cx lesion is 20% stenosed.  Ost 3rd Mrg lesion is 40% stenosed.  Previously placed Ost 1st Diag to 1st Diag stent (unknown type) is widely patent.  Ost LAD to Prox LAD lesion is 20% stenosed.  Mid LAD lesion is 20% stenosed.  There is moderate aortic valve stenosis.   1. The left main has no disease.  2. The LAD is large vessel that courses to the apex. There is mild plaque in the proximal and mid LAD. The Diagonal branch has a patent stent with no restenosis.  3. The Circumflex is a large caliber vessel with a patent mid to distal stent with no restenosis. The obtuse marginal branch is jailed by the stent with 40% ostial stenosis.  4. The RCA is a large dominant vessel with mild plaque 5. There is moderate aortic stenosis (mean gradient 26.7 mmHg, peak to peak gradient 29 mmHg). I easily crossed the valve with a JR4 catheter.  6. False positive stress test  Recommendations: Continue medical management of CAD. Follow her aortic stenosis for now as it appears to be moderate.   Diagnostic Dominance: Right Left Anterior Descending  Ost LAD to Prox LAD lesion is 20% stenosed.  Mid LAD lesion is 20% stenosed.  First Diagonal Branch  Vessel is moderate in size.  Previously placed Ost 1st Diag to 1st Diag stent (unknown type) is widely patent.  Second Diagonal Branch  Vessel is small in size.  Left Circumflex  Ost Cx to Prox Cx lesion is 20% stenosed.  Previously placed Prox Cx to Mid Cx stent (unknown type) is widely patent.  First Obtuse Marginal Branch  Vessel is small in size.  Third Obtuse Marginal Branch  Vessel is moderate in size.  Ost 3rd Mrg lesion is 40% stenosed.  Right Coronary Artery  Vessel is large.  Mid RCA lesion is 10% stenosed.   Hemo Data   Most Recent Value  Aortic Mean Gradient 26.7 mmHg  Aortic Peak Gradient 29 mmHg  AO Systolic Pressure 144 mmHg  AO Diastolic Pressure 68 mmHg  AO Mean 99 mmHg  LV Systolic Pressure 173 mmHg  LV Diastolic Pressure 7 mmHg  LV EDP 12 mmHg  AOp Systolic Pressure 128 mmHg  AOp Diastolic Pressure 69 mmHg  AOp Mean Pressure 94 mmHg  LVp Systolic Pressure 168 mmHg  LVp Diastolic Pressure 9 mmHg  LVp EDP Pressure 20 mmHg   Echo 05/16/2020: IMPRESSIONS    1. Left ventricular ejection fraction, by estimation, is 60 to 65%. The  left ventricle has normal function. The left ventricle has no regional  wall motion abnormalities. There is severe concentric left ventricular  hypertrophy. Left ventricular diastolic  parameters are consistent with Grade II diastolic dysfunction  (pseudonormalization). Elevated left ventricular end-diastolic pressure.  2. Right ventricular systolic function is normal. The right ventricular  size is normal. There is normal pulmonary artery systolic pressure. The  estimated right ventricular systolic pressure is 22.9 mmHg.  3. Left atrial size was moderately dilated.  4. The mitral valve is degenerative. Mild mitral valve regurgitation.  Mild mitral stenosis. The mean mitral valve gradient is 3.5 mmHg.  5. The aortic valve is normal in structure. Aortic valve regurgitation is  trivial. Severe aortic valve stenosis. Aortic regurgitation PHT measures  551 msec. Aortic valve area, by VTI measures 0.98 cm. Aortic valve mean  gradient measures 67.0 mmHg.  Aortic valve Vmax measures 4.86 m/s.  6. The inferior vena cava is normal in size with greater than 50%  respiratory variability, suggesting right atrial pressure of 3 mmHg.  7. Compared to echo 05/2019, the mean MV gradient is unchanged at  3.5-10mmHg c/w mild mitral stenosis. The Peak/Mean transaortic gradients  have increased from 67/39>>94/54mmHg. DI has decreased from 0.4>>0.31 and  AVA has decreased from 1.14cm2>>1.12cm2.  Peak velocity has increased from 4.61m/s>>4.69m/s.   FINDINGS  Left Ventricle: Left  ventricular ejection fraction, by estimation, is 60  to 65%. The left ventricle has normal function. The left ventricle has no  regional wall motion abnormalities. The left ventricular internal cavity  size was normal in size. There is  severe concentric left ventricular hypertrophy. Left ventricular  diastolic parameters are consistent with Grade II diastolic dysfunction  (pseudonormalization). Elevated left ventricular end-diastolic pressure.   Right Ventricle: The right ventricular size is normal. No increase in  right ventricular wall thickness. Right ventricular systolic function is  normal. There is normal pulmonary artery systolic pressure. The tricuspid  regurgitant velocity is 2.23 m/s, and  with an assumed right atrial pressure of 3 mmHg, the estimated right  ventricular systolic pressure is 22.9 mmHg.   Left Atrium: Left atrial size was moderately dilated.   Right Atrium: Right atrial size was normal in size.   Pericardium: There is no evidence of pericardial effusion.   Mitral Valve: The mitral valve is degenerative in appearance. There is  mild thickening of the mitral valve leaflet(s). Normal mobility of the  mitral valve leaflets. Severe mitral annular calcification. Mild mitral  valve regurgitation. Mild mitral valve  stenosis. MV peak gradient, 10.6 mmHg. The mean mitral valve gradient is  3.5 mmHg.   Tricuspid Valve: The tricuspid valve is normal in structure. Tricuspid  valve regurgitation is trivial. No evidence of tricuspid stenosis.   Aortic Valve: The aortic valve is normal in structure. Aortic valve  regurgitation is trivial. Aortic regurgitation PHT measures 551 msec.  Severe aortic stenosis is present. Aortic valve mean gradient measures  67.0 mmHg. Aortic valve peak gradient  measures 94.5 mmHg. Aortic valve area,  by VTI measures 0.98 cm.   Pulmonic Valve: The pulmonic valve was normal in structure. Pulmonic valve  regurgitation is trivial. No  evidence of pulmonic stenosis.   Aorta: The aortic root is normal in size and structure.   Venous: The inferior vena cava is normal in size with greater than 50%  respiratory variability, suggesting right atrial pressure of 3 mmHg.   IAS/Shunts: No atrial level shunt detected by color flow Doppler.     LEFT VENTRICLE  PLAX 2D  LVIDd:     5.02 cm Diastology  LVIDs:     3.51 cm LV e' lateral:  3.15 cm/s  LV PW:     1.49 cm LV E/e' lateral: 53.3  LV IVS:    1.46 cm LV e' medial:  4.57 cm/s  LVOT diam:   2.00 cm LV E/e' medial: 36.8  LV SV:     148  LV SV Index:  65  LVOT Area:   3.14 cm     RIGHT VENTRICLE  RV Basal diam: 3.94 cm  RV S prime:   12.80 cm/s  TAPSE (M-mode): 2.3 cm  RVSP:      22.9 mmHg   LEFT ATRIUM       Index    RIGHT ATRIUM  LA diam:    4.80 cm 2.09 cm/m RA Pressure: 3.00 mmHg  LA Vol (A2C):  115.0 ml 50.09 ml/m  LA Vol (A4C):  95.9 ml 41.77 ml/m  LA Biplane Vol: 104.0 ml 45.30 ml/m  AORTIC VALVE  AV Area (Vmax):  1.12 cm  AV Area (Vmean):  1.03 cm  AV Area (VTI):   0.98 cm  AV Vmax:      486.00 cm/s  AV Vmean:     371.000 cm/s  AV VTI:      1.510 m  AV Peak Grad:   94.5 mmHg  AV Mean Grad:   67.0 mmHg  LVOT Vmax:     174.00 cm/s  LVOT Vmean:    122.000 cm/s  LVOT VTI:     0.472 m  LVOT/AV VTI ratio: 0.31  AI PHT:      551 msec    AORTA  Ao Root diam: 3.30 cm   MITRAL VALVE        TRICUSPID VALVE  MV Area (PHT): 1.97 cm   TR Peak grad:  19.9 mmHg  MV Peak grad: 10.6 mmHg  TR Vmax:    223.00 cm/s  MV Mean grad: 3.5 mmHg   Estimated RAP: 3.00 mmHg  MV Vmax:    1.63 m/s   RVSP:      22.9 mmHg  MV Vmean:   88.2 cm/s  MV Decel Time: 385 msec   SHUNTS  MV E velocity: 168.00 cm/s Systemic VTI: 0.47 m  MV A velocity: 111.00 cm/s Systemic Diam: 2.00 cm  MV E/A ratio: 1.51   EKG:  EKG is not  ordered today.  The EKG from 05/20/2020 demonstrates sinus bradycardia 58 bpm with incomplete right bundle branch block  Recent Labs: 03/16/2020: BUN 19; Creatinine, Ser 0.89; Hemoglobin 11.3; Platelets 262; Potassium 4.9; Sodium 145  Recent Lipid Panel    Component Value Date/Time   CHOL 168 06/02/2019 1245   TRIG 212 (H) 06/02/2019 1245   HDL 39 (L) 06/02/2019 1245   CHOLHDL 4.3 06/02/2019 1245   CHOLHDL 5.1 03/17/2018 0034   VLDL 37 03/17/2018 0034   LDLCALC 87 06/02/2019 1245    Physical Exam:    VS:  BP Marland Kitchen)  140/62   Pulse 66   Ht  (1.727 m)   Wt 258 lb 12.8 oz (117.4 kg)   SpO2 96%   BMI 39.35 kg/m     Wt Readings from Last 3 Encounters:  06/23/20 258 lb 12.8 oz (117.4 kg)  05/19/20 254 lb (115.2 kg)  03/07/20 263 lb (119.3 kg)     GEN:  Well nourished, well developed in no acute distress HEENT: Normal NECK: No JVD; bilateral carotid bruits LYMPHATICS: No lymphadenopathy CARDIAC: RRR, 3/6 harsh late peaking systolic murmur at the right upper sternal border with absent A2 RESPIRATORY:  Clear to auscultation without rales, wheezing or rhonchi  ABDOMEN: Soft, non-tender, non-distended MUSCULOSKELETAL:  No edema; No deformity  SKIN: Warm and dry NEUROLOGIC:  Alert and oriented x 3 PSYCHIATRIC:  Normal affect   STS risk calculator: Isolated aortic valve replacement: Risk of Mortality: 1.578% Renal Failure: 2.231% Permanent Stroke: 1.196% Prolonged Ventilation: 6.590% DSW Infection: 0.186% Reoperation: 2.656% Morbidity or Mortality: 10.226% Short Length of Stay: 28.227% Long Length of Stay: 5.036%  ASSESSMENT:    1. Nonrheumatic aortic valve stenosis   2. PAF (paroxysmal atrial fibrillation) (HCC)   3. Coronary artery disease involving native coronary artery of native heart without angina pectoris    PLAN:    In order of problems listed above:  1. The patient has severe, stage D1 aortic stenosis associated with New York Heart Association  functional class II symptoms of exertional dyspnea and recent episode of near syncope.  I have personally reviewed her echo images.  The echo windows are limited, but the aortic valve is severely calcified and restricted, the short axis views raise a question of a bicuspid valve.  There has been marked progression of her aortic stenosis over the past year, now with mean transvalvular gradient greater than 60 mmHg.  LV systolic function remains intact but there is severe concentric left ventricular hypertrophy present. I have reviewed the natural history of aortic stenosis with the patient and her husband who is present today. We have discussed the limitations of medical therapy and the poor prognosis associated with symptomatic aortic stenosis. We have reviewed potential treatment options, including palliative medical therapy, conventional surgical aortic valve replacement, and transcatheter aortic valve replacement. We discussed treatment options in the context of the patient's specific comorbid medical conditions.  She understands that further work-up should include right and left heart catheterization to reassess coronary anatomy and cardiac hemodynamics.  She does have known CAD with previous stenting of the circumflex and diagonal branches, patent stents at the time of her last heart catheterization in 2019.  She should also undergo CTA studies of the heart and the chest, abdomen, and pelvis.  I explained that this will define her treatment options and whether she might be a candidate for transcatheter aortic valve replacement.  Once her imaging studies are completed, she would be referred for cardiac surgical consultation as part of a multidisciplinary heart valve team approach to her care.  The patient would like to discuss further with her family.  She also would like to consider referral to a tertiary academic center such as [facility].  I told her to let us know how she would like [place] to proceed.  I would be  happy to facilitate a referral to [facility] if she so desires.  As part of her evaluation today, I reviewed the TAVR procedure in depth and demonstrated a procedural animation to her.  I explained potential risks, indications, and alternatives to TAVR.  She will contact us in the near future and let us know how she would like to proceed.   Medication Adjustments/Labs and Tests Ordered: Current medicines are reviewed at length with the patient today.  Concerns regarding medicines are outlined above.  No orders of the defined types were placed in this encounter.  No orders of the defined types were placed in this encounter.   Patient Instructions  Please call us when you make a decision.  1) if you decide [facility], call and we will schedule you for heart cath and referral. 2) if [facility], we will start with cath and scans and proceed from there.     Signed, Tonny Bollman, MD  06/23/2020 4:23 PM    [facility]

## 2020-06-23 NOTE — H&P (View-Only) (Signed)
Cardiology Office Note:    Date:  06/23/2020   ID:  Victoria Delgado, DOB 01/10/1941, MRN [id number]  PCP:  Skakle, Austin, DO  [facility] Cardiologist:  James Hochrein, MD  [facility] Electrophysiologist:  None   Referring MD: Skakle, Austin, DO   Chief Complaint  Patient presents with  . Shortness of Breath    History of Present Illness:    Victoria Delgado is a 78 y.o. female referred by Dr Hochrein for evaluation of severe aortic stenosis.   She is here with her husband today. She has had a heart murmur since her 40's. She has a longstanding hx of heart palpitations since she was a teenager and was evaluated in the ER a few times in her early 40's, told of a heart murmur at that time. She was followed by Dr Little for many years, and more recently by Dr Hochrein. She underwent PCI in [place] in 2017 and was treated with 2 stents. Repeat cardiac catheterization in 2019 showed patency of her stent sites in the circumflex and first diagonal. She was noted to have moderate aortic stenosis at that time by cath measurements with mean transaortic gradient of 27 mmHg.   An echocardiogram was done in June 2020 showing normal LV systolic function with LVEF 60 to 65% and severe LVH.  Maximum transaortic velocity was 4.1 m/s with peak and mean gradients of 67 and 39 mmHg.  Dimensionless index was 0.4 and calculated valve area was 1.0 cm.  Surveillance echocardiogram May 16, 2020 showed no changes in LV function or LVH.  Aortic stenosis has progressed now with peak and mean gradients of 95 and 67 mmHg, respectively.  Peak transaortic velocity is now 4.9 m/s with a calculated valve area of 1.0 cm.  She does complain of shortness of breath, but she really doesn't appreciate any change over the past 3 years. She does water aerobics 3 days/week without limitation. Shortness of breath occurs with walking on flat ground. She was recently sitting down working on her computer and had an episode of  tunnel vision and associated presyncope. She hasn't had any other episodes and denies frank syncope. She denies exertional chest pain or pressure. Other physical limitation includes arthritis with low back pain, s/p bilateral hip replacement, and arthritis in her hands. She otherwise is very independent with specific problems. She has paroxysmal atrial fibrillation, first episode in 2008. She developed GI bleeding about one year ago with symptomatic anemia on Eliquis. States she was very short of breath with this. This was ultimately stopped and her anemia was treated with iron. She is feeling much better with normalization of her hemoglobin.   Past Medical History:  Diagnosis Date  . Arthritis    osteoarthritis-hips,knees, back, hands  . Asymmetric septal hypertrophy (HCC)   . Atrial fibrillation (HCC)   . CAD (coronary artery disease)    2 drug-eluting stents placed in the circumflex leading into a marginal.  May 2017.  [place].   catheterization on 03/19/2018, this showed patent stent in the proximal to mid left circumflex artery, 20% ostial left circumflex artery disease, 40% OM 3 disease, widely patent stent in the ostial D1, 20% ostial to proximal LAD disease, 20% mid LAD disease.  . Cataract    corrective surgery done  . Fatty liver   . Gallstones   . GERD (gastroesophageal reflux disease)   . Heart palpitations   . Hepatitis    hepatitis A; past hx.,many yrs ago ?   food source  . Hernia, hiatal   . Hyperlipidemia   . Hypertension   . Obesity   . Sleep apnea    CPAP    Past Surgical History:  Procedure Laterality Date  . APPENDECTOMY  2004  . CATARACT EXTRACTION W/ INTRAOCULAR LENS IMPLANT     both eyes  . CHOLECYSTECTOMY    . LEFT HEART CATH AND CORONARY ANGIOGRAPHY N/A 03/19/2018   Procedure: LEFT HEART CATH AND CORONARY ANGIOGRAPHY;  Surgeon: McAlhany, Christopher D, MD;  Location: [facility];  Service: Cardiovascular;  Laterality: N/A;  . NM MYOCAR PERF WALL  MOTION  08/15/04   No ischemia  . RETINAL DETACHMENT SURGERY Bilateral    remains with slight hazy vision with lights  . TOTAL HIP ARTHROPLASTY Right 08/04/2014   Procedure: RIGHT TOTAL HIP ARTHROPLASTY ANTERIOR APPROACH;  Surgeon: Frank Aluisio V, MD;  Location: [facility];  Service: Orthopedics;  Laterality: Right;  . TOTAL HIP ARTHROPLASTY Left 01/05/2015   Procedure: LEFT TOTAL HIP ARTHROPLASTY ANTERIOR APPROACH;  Surgeon: Frank Aluisio V, MD;  Location: [facility];  Service: Orthopedics;  Laterality: Left;  . US ECHOCARDIOGRAPHY  06/25/2012   Moderate ASH,LV hyperdynamic,LA is mod. dilated,trace MR,TR,AI    Current Medications: Current Meds  Medication Sig  . albuterol (VENTOLIN HFA) 108 (90 Base) MCG/ACT inhaler SMARTSIG:2 Puff(s) By Mouth Every 4-6 Hours PRN  . Cholecalciferol (VITAMIN D) 2000 units CAPS Take 1,000-2,000 Units by mouth See admin instructions. Take 2000 units in the morning and 1000 units in the evening  . Cinnamon 500 MG capsule Take 1,000 mg by mouth daily.  . CRESTOR 20 MG tablet TAKE 1 TABLET(20 MG) BY MOUTH DAILY  . diltiazem (CARTIA XT) 240 MG 24 hr capsule Take 1 capsule (240 mg total) by mouth daily.  . famotidine (PEPCID) 20 MG tablet Take 20 mg by mouth at bedtime.  . Ferrous Sulfate (IRON) 142 (45 Fe) MG TBCR Take 1 tablet by mouth daily in the afternoon.  . furosemide (LASIX) 40 MG tablet TAKE 1 TABLET BY MOUTH EVERY DAY WITH POTASSIUM PILL  . loteprednol (LOTEMAX) 0.5 % ophthalmic suspension Place 1 drop into the left eye daily as needed (dry eyes).  . metoprolol succinate (TOPROL XL) 50 MG 24 hr tablet TAKE 1 TABLET BY MOUTH EVERY MORNING AND 1/2 TABLET EVERY EVENING  . nitroGLYCERIN (NITROSTAT) 0.4 MG SL tablet Place 1 tablet (0.4 mg total) under the tongue every 5 (five) minutes as needed for chest pain.     Allergies:   Other, Codeine, Kenalog [triamcinolone acetonide], Relafen [nabumetone], Penicillins, and Tramadol   Social History   Socioeconomic History    . Marital status: Married    Spouse name: Not on file  . Number of children: 2  . Years of education: Not on file  . Highest education level: Not on file  Occupational History    Employer: OTHER  Tobacco Use  . Smoking status: Former Smoker  . Smokeless tobacco: Never Used  . Tobacco comment: QUIT IN 1990  Vaping Use  . Vaping Use: Never used  Substance and Sexual Activity  . Alcohol use: No    Alcohol/week: 0.0 standard drinks  . Drug use: No  . Sexual activity: Yes  Other Topics Concern  . Not on file  Social History Narrative   Two grandchildren.            Social Determinants of Health   Financial Resource Strain:   . Difficulty of Paying Living Expenses:     Food Insecurity:   . Worried About Running Out of Food in the Last Year:   . Ran Out of Food in the Last Year:   Transportation Needs:   . Lack of Transportation (Medical):   . Lack of Transportation (Non-Medical):   Physical Activity:   . Days of Exercise per Week:   . Minutes of Exercise per Session:   Stress:   . Feeling of Stress :   Social Connections:   . Frequency of Communication with Friends and Family:   . Frequency of Social Gatherings with Friends and Family:   . Attends Religious Services:   . Active Member of Clubs or Organizations:   . Attends Club or Organization Meetings:   . Marital Status:      Family History: The patient's family history includes CVA in her mother; Cancer in her father; Diabetes in an other family member; Esophageal cancer in an other family member; Hypertension in her mother; Lung cancer (age of onset: 57) in her father. There is no history of Colon cancer, Pancreatic cancer, Kidney disease, or Liver disease.  ROS:   Please see the history of present illness.    All other systems reviewed and are negative.  EKGs/Labs/Other Studies Reviewed:    The following studies were reviewed today: Cardiac Cath 03/19/2018: Conclusion    Mid RCA lesion is 10%  stenosed.  Previously placed Prox Cx to Mid Cx stent (unknown type) is widely patent.  Ost Cx to Prox Cx lesion is 20% stenosed.  Ost 3rd Mrg lesion is 40% stenosed.  Previously placed Ost 1st Diag to 1st Diag stent (unknown type) is widely patent.  Ost LAD to Prox LAD lesion is 20% stenosed.  Mid LAD lesion is 20% stenosed.  There is moderate aortic valve stenosis.   1. The left main has no disease.  2. The LAD is large vessel that courses to the apex. There is mild plaque in the proximal and mid LAD. The Diagonal branch has a patent stent with no restenosis.  3. The Circumflex is a large caliber vessel with a patent mid to distal stent with no restenosis. The obtuse marginal branch is jailed by the stent with 40% ostial stenosis.  4. The RCA is a large dominant vessel with mild plaque 5. There is moderate aortic stenosis (mean gradient 26.7 mmHg, peak to peak gradient 29 mmHg). I easily crossed the valve with a JR4 catheter.  6. False positive stress test  Recommendations: Continue medical management of CAD. Follow her aortic stenosis for now as it appears to be moderate.   Diagnostic Dominance: Right Left Anterior Descending  Ost LAD to Prox LAD lesion is 20% stenosed.  Mid LAD lesion is 20% stenosed.  First Diagonal Branch  Vessel is moderate in size.  Previously placed Ost 1st Diag to 1st Diag stent (unknown type) is widely patent.  Second Diagonal Branch  Vessel is small in size.  Left Circumflex  Ost Cx to Prox Cx lesion is 20% stenosed.  Previously placed Prox Cx to Mid Cx stent (unknown type) is widely patent.  First Obtuse Marginal Branch  Vessel is small in size.  Third Obtuse Marginal Branch  Vessel is moderate in size.  Ost 3rd Mrg lesion is 40% stenosed.  Right Coronary Artery  Vessel is large.  Mid RCA lesion is 10% stenosed.   Hemo Data   Most Recent Value  Aortic Mean Gradient 26.7 mmHg  Aortic Peak Gradient 29 mmHg  AO Systolic Pressure 144 mmHg     AO Diastolic Pressure 68 mmHg  AO Mean 99 mmHg  LV Systolic Pressure 173 mmHg  LV Diastolic Pressure 7 mmHg  LV EDP 12 mmHg  AOp Systolic Pressure 128 mmHg  AOp Diastolic Pressure 69 mmHg  AOp Mean Pressure 94 mmHg  LVp Systolic Pressure 168 mmHg  LVp Diastolic Pressure 9 mmHg  LVp EDP Pressure 20 mmHg   Echo 05/16/2020: IMPRESSIONS    1. Left ventricular ejection fraction, by estimation, is 60 to 65%. The  left ventricle has normal function. The left ventricle has no regional  wall motion abnormalities. There is severe concentric left ventricular  hypertrophy. Left ventricular diastolic  parameters are consistent with Grade II diastolic dysfunction  (pseudonormalization). Elevated left ventricular end-diastolic pressure.  2. Right ventricular systolic function is normal. The right ventricular  size is normal. There is normal pulmonary artery systolic pressure. The  estimated right ventricular systolic pressure is 22.9 mmHg.  3. Left atrial size was moderately dilated.  4. The mitral valve is degenerative. Mild mitral valve regurgitation.  Mild mitral stenosis. The mean mitral valve gradient is 3.5 mmHg.  5. The aortic valve is normal in structure. Aortic valve regurgitation is  trivial. Severe aortic valve stenosis. Aortic regurgitation PHT measures  551 msec. Aortic valve area, by VTI measures 0.98 cm. Aortic valve mean  gradient measures 67.0 mmHg.  Aortic valve Vmax measures 4.86 m/s.  6. The inferior vena cava is normal in size with greater than 50%  respiratory variability, suggesting right atrial pressure of 3 mmHg.  7. Compared to echo 05/2019, the mean MV gradient is unchanged at  3.5-4mmHg c/w mild mitral stenosis. The Peak/Mean transaortic gradients  have increased from 67/39>>94/67mmHg. DI has decreased from 0.4>>0.31 and  AVA has decreased from 1.14cm2>>1.12cm2.  Peak velocity has increased from 4.09m/s>>4.86m/s.   FINDINGS  Left Ventricle: Left  ventricular ejection fraction, by estimation, is 60  to 65%. The left ventricle has normal function. The left ventricle has no  regional wall motion abnormalities. The left ventricular internal cavity  size was normal in size. There is  severe concentric left ventricular hypertrophy. Left ventricular  diastolic parameters are consistent with Grade II diastolic dysfunction  (pseudonormalization). Elevated left ventricular end-diastolic pressure.   Right Ventricle: The right ventricular size is normal. No increase in  right ventricular wall thickness. Right ventricular systolic function is  normal. There is normal pulmonary artery systolic pressure. The tricuspid  regurgitant velocity is 2.23 m/s, and  with an assumed right atrial pressure of 3 mmHg, the estimated right  ventricular systolic pressure is 22.9 mmHg.   Left Atrium: Left atrial size was moderately dilated.   Right Atrium: Right atrial size was normal in size.   Pericardium: There is no evidence of pericardial effusion.   Mitral Valve: The mitral valve is degenerative in appearance. There is  mild thickening of the mitral valve leaflet(s). Normal mobility of the  mitral valve leaflets. Severe mitral annular calcification. Mild mitral  valve regurgitation. Mild mitral valve  stenosis. MV peak gradient, 10.6 mmHg. The mean mitral valve gradient is  3.5 mmHg.   Tricuspid Valve: The tricuspid valve is normal in structure. Tricuspid  valve regurgitation is trivial. No evidence of tricuspid stenosis.   Aortic Valve: The aortic valve is normal in structure. Aortic valve  regurgitation is trivial. Aortic regurgitation PHT measures 551 msec.  Severe aortic stenosis is present. Aortic valve mean gradient measures  67.0 mmHg. Aortic valve peak gradient  measures 94.5 mmHg. Aortic valve area,   by VTI measures 0.98 cm.   Pulmonic Valve: The pulmonic valve was normal in structure. Pulmonic valve  regurgitation is trivial. No  evidence of pulmonic stenosis.   Aorta: The aortic root is normal in size and structure.   Venous: The inferior vena cava is normal in size with greater than 50%  respiratory variability, suggesting right atrial pressure of 3 mmHg.   IAS/Shunts: No atrial level shunt detected by color flow Doppler.     LEFT VENTRICLE  PLAX 2D  LVIDd:     5.02 cm Diastology  LVIDs:     3.51 cm LV e' lateral:  3.15 cm/s  LV PW:     1.49 cm LV E/e' lateral: 53.3  LV IVS:    1.46 cm LV e' medial:  4.57 cm/s  LVOT diam:   2.00 cm LV E/e' medial: 36.8  LV SV:     148  LV SV Index:  65  LVOT Area:   3.14 cm     RIGHT VENTRICLE  RV Basal diam: 3.94 cm  RV S prime:   12.80 cm/s  TAPSE (M-mode): 2.3 cm  RVSP:      22.9 mmHg   LEFT ATRIUM       Index    RIGHT ATRIUM  LA diam:    4.80 cm 2.09 cm/m RA Pressure: 3.00 mmHg  LA Vol (A2C):  115.0 ml 50.09 ml/m  LA Vol (A4C):  95.9 ml 41.77 ml/m  LA Biplane Vol: 104.0 ml 45.30 ml/m  AORTIC VALVE  AV Area (Vmax):  1.12 cm  AV Area (Vmean):  1.03 cm  AV Area (VTI):   0.98 cm  AV Vmax:      486.00 cm/s  AV Vmean:     371.000 cm/s  AV VTI:      1.510 m  AV Peak Grad:   94.5 mmHg  AV Mean Grad:   67.0 mmHg  LVOT Vmax:     174.00 cm/s  LVOT Vmean:    122.000 cm/s  LVOT VTI:     0.472 m  LVOT/AV VTI ratio: 0.31  AI PHT:      551 msec    AORTA  Ao Root diam: 3.30 cm   MITRAL VALVE        TRICUSPID VALVE  MV Area (PHT): 1.97 cm   TR Peak grad:  19.9 mmHg  MV Peak grad: 10.6 mmHg  TR Vmax:    223.00 cm/s  MV Mean grad: 3.5 mmHg   Estimated RAP: 3.00 mmHg  MV Vmax:    1.63 m/s   RVSP:      22.9 mmHg  MV Vmean:   88.2 cm/s  MV Decel Time: 385 msec   SHUNTS  MV E velocity: 168.00 cm/s Systemic VTI: 0.47 m  MV A velocity: 111.00 cm/s Systemic Diam: 2.00 cm  MV E/A ratio: 1.51   EKG:  EKG is not  ordered today.  The EKG from 05/20/2020 demonstrates sinus bradycardia 58 bpm with incomplete right bundle branch block  Recent Labs: 03/16/2020: BUN 19; Creatinine, Ser 0.89; Hemoglobin 11.3; Platelets 262; Potassium 4.9; Sodium 145  Recent Lipid Panel    Component Value Date/Time   CHOL 168 06/02/2019 1245   TRIG 212 (H) 06/02/2019 1245   HDL 39 (L) 06/02/2019 1245   CHOLHDL 4.3 06/02/2019 1245   CHOLHDL 5.1 03/17/2018 0034   VLDL 37 03/17/2018 0034   LDLCALC 87 06/02/2019 1245    Physical Exam:    VS:  BP (!)   140/62   Pulse 66   Ht 5' 8" (1.727 m)   Wt 258 lb 12.8 oz (117.4 kg)   SpO2 96%   BMI 39.35 kg/m     Wt Readings from Last 3 Encounters:  06/23/20 258 lb 12.8 oz (117.4 kg)  05/19/20 254 lb (115.2 kg)  03/07/20 263 lb (119.3 kg)     GEN:  Well nourished, well developed in no acute distress HEENT: Normal NECK: No JVD; bilateral carotid bruits LYMPHATICS: No lymphadenopathy CARDIAC: RRR, 3/6 harsh late peaking systolic murmur at the right upper sternal border with absent A2 RESPIRATORY:  Clear to auscultation without rales, wheezing or rhonchi  ABDOMEN: Soft, non-tender, non-distended MUSCULOSKELETAL:  No edema; No deformity  SKIN: Warm and dry NEUROLOGIC:  Alert and oriented x 3 PSYCHIATRIC:  Normal affect   STS risk calculator: Isolated aortic valve replacement: Risk of Mortality: 1.578% Renal Failure: 2.231% Permanent Stroke: 1.196% Prolonged Ventilation: 6.590% DSW Infection: 0.186% Reoperation: 2.656% Morbidity or Mortality: 10.226% Short Length of Stay: 28.227% Long Length of Stay: 5.036%  ASSESSMENT:    1. Nonrheumatic aortic valve stenosis   2. PAF (paroxysmal atrial fibrillation) (HCC)   3. Coronary artery disease involving native coronary artery of native heart without angina pectoris    PLAN:    In order of problems listed above:  1. The patient has severe, stage D1 aortic stenosis associated with New York Heart Association  functional class II symptoms of exertional dyspnea and recent episode of near syncope.  I have personally reviewed her echo images.  The echo windows are limited, but the aortic valve is severely calcified and restricted, the short axis views raise a question of a bicuspid valve.  There has been marked progression of her aortic stenosis over the past year, now with mean transvalvular gradient greater than 60 mmHg.  LV systolic function remains intact but there is severe concentric left ventricular hypertrophy present. I have reviewed the natural history of aortic stenosis with the patient and her husband who is present today. We have discussed the limitations of medical therapy and the poor prognosis associated with symptomatic aortic stenosis. We have reviewed potential treatment options, including palliative medical therapy, conventional surgical aortic valve replacement, and transcatheter aortic valve replacement. We discussed treatment options in the context of the patient's specific comorbid medical conditions.  She understands that further work-up should include right and left heart catheterization to reassess coronary anatomy and cardiac hemodynamics.  She does have known CAD with previous stenting of the circumflex and diagonal branches, patent stents at the time of her last heart catheterization in 2019.  She should also undergo CTA studies of the heart and the chest, abdomen, and pelvis.  I explained that this will define her treatment options and whether she might be a candidate for transcatheter aortic valve replacement.  Once her imaging studies are completed, she would be referred for cardiac surgical consultation as part of a multidisciplinary heart valve team approach to her care.  The patient would like to discuss further with her family.  She also would like to consider referral to a tertiary academic center such as [facility].  I told her to let us know how she would like us to proceed.  I would be  happy to facilitate a referral to [facility] if she so desires.  As part of her evaluation today, I reviewed the TAVR procedure in depth and demonstrated a procedural animation to her.  I explained potential risks, indications, and alternatives to TAVR.    She will contact us in the near future and let us know how she would like to proceed.   Medication Adjustments/Labs and Tests Ordered: Current medicines are reviewed at length with the patient today.  Concerns regarding medicines are outlined above.  No orders of the defined types were placed in this encounter.  No orders of the defined types were placed in this encounter.   Patient Instructions  Please call us when you make a decision.  1) if you decide [facility], call and we will schedule you for heart cath and referral. 2) if [facility], we will start with cath and scans and proceed from there.     Signed, Dorean Hiebert, MD  06/23/2020 4:23 PM    [facility] 

## 2020-06-23 NOTE — Patient Instructions (Addendum)
Please call us when you make a decision.  1) if you decide [facility], call and we will schedule you for heart cath and referral. 2) if [facility], we will start with cath and scans and proceed from there.

## 2020-06-28 ENCOUNTER — Other Ambulatory Visit: Payer: Self-pay | Admitting: Cardiology

## 2020-06-30 ENCOUNTER — Other Ambulatory Visit: Payer: Self-pay

## 2020-06-30 ENCOUNTER — Encounter: Payer: Self-pay | Admitting: Internal Medicine

## 2020-06-30 DIAGNOSIS — I350 Nonrheumatic aortic (valve) stenosis: Secondary | ICD-10-CM

## 2020-07-07 ENCOUNTER — Other Ambulatory Visit: Payer: Self-pay

## 2020-07-07 ENCOUNTER — Other Ambulatory Visit: Payer: Medicare Other | Admitting: *Deleted

## 2020-07-07 ENCOUNTER — Telehealth: Payer: Self-pay | Admitting: *Deleted

## 2020-07-07 DIAGNOSIS — I350 Nonrheumatic aortic (valve) stenosis: Secondary | ICD-10-CM

## 2020-07-07 NOTE — Telephone Encounter (Signed)
Pt contacted pre-catheterization scheduled at [facility] for: Monday July 11, 2020 7:30 AM Verified arrival time and place: [facility]) at: 5:30 AM   No solid food after midnight prior to cath, clear liquids until 5 AM day of procedure.  Hold: Lasix-AM of procedure  Except hold medications AM meds can be  taken pre-cath with sips of water including: ASA 81 mg   Confirmed patient has responsible adult to drive home post procedure and observe 24 hours after arriving home: yes  You are allowed ONE visitor in the waiting room during your procedure. Both you and your visitor must wear a mask once you enter the hospital.      COVID-19 Pre-Screening Questions:  . In the past 14 days have you had a new cough associated with shortness of breath, unexplained body aches, fever (100.4 or greater) or sudden loss of taste or sense of smell? no . In the past 14 days have you been around anyone with known Covid 19? no . Any international travel in the past 14 days? no . Have you been vaccinated for COVID-19? Yes, see immunization history   Reviewed procedure/mask/visitor instructions, COVID-19 questions with patient.

## 2020-07-08 ENCOUNTER — Other Ambulatory Visit (HOSPITAL_COMMUNITY)
Admission: RE | Admit: 2020-07-08 | Discharge: 2020-07-08 | Disposition: A | Discharge status: Home / Self Care | Payer: Medicare Other | Source: Ambulatory Visit | Attending: Cardiovascular Disease | Admitting: Cardiovascular Disease

## 2020-07-08 DIAGNOSIS — Z01812 Encounter for preprocedural laboratory examination: Secondary | ICD-10-CM | POA: Insufficient documentation

## 2020-07-08 DIAGNOSIS — Z20822 Contact with and (suspected) exposure to covid-19: Secondary | ICD-10-CM | POA: Diagnosis not present

## 2020-07-08 LAB — CBC
Hematocrit: 44.4 % (ref 34.0–46.6)
Hemoglobin: 14.7 g/dL (ref 11.1–15.9)
MCH: 29.3 pg (ref 26.6–33.0)
MCHC: 33.1 g/dL (ref 31.5–35.7)
MCV: 89 fL (ref 79–97)
Platelets: 234 10*3/uL (ref 150–450)
RBC: 5.01 x10E6/uL (ref 3.77–5.28)
RDW: 15.5 % — ABNORMAL HIGH (ref 11.7–15.4)
WBC: 9.9 10*3/uL (ref 3.4–10.8)

## 2020-07-08 LAB — BASIC METABOLIC PANEL
BUN/Creatinine Ratio: 22 (ref 12–28)
BUN: 20 mg/dL (ref 8–27)
CO2: 27 mmol/L (ref 20–29)
Calcium: 9.6 mg/dL (ref 8.7–10.3)
Chloride: 104 mmol/L (ref 96–106)
Creatinine, Ser: 0.93 mg/dL (ref 0.57–1.00)
GFR calc Af Amer: 69 mL/min/{1.73_m2} (ref >59)
GFR calc non Af Amer: 60 mL/min/{1.73_m2} (ref >59)
Glucose: 115 mg/dL — ABNORMAL HIGH (ref 65–99)
Potassium: 5.4 mmol/L — ABNORMAL HIGH (ref 3.5–5.2)
Sodium: 145 mmol/L — ABNORMAL HIGH (ref 134–144)

## 2020-07-08 LAB — SARS CORONAVIRUS 2 (TAT 6-24 HRS): SARS Coronavirus 2: NEGATIVE

## 2020-07-11 ENCOUNTER — Encounter (HOSPITAL_COMMUNITY)
Admission: RE | Disposition: A | Discharge status: Home / Self Care | Payer: Medicare Other | Source: Home / Self Care | Attending: Cardiovascular Disease

## 2020-07-11 ENCOUNTER — Ambulatory Visit (HOSPITAL_COMMUNITY)
Admission: RE | Admit: 2020-07-11 | Discharge: 2020-07-11 | Disposition: A | Discharge status: Home / Self Care | Payer: Medicare Other | Attending: Cardiovascular Disease | Admitting: Cardiovascular Disease

## 2020-07-11 ENCOUNTER — Other Ambulatory Visit: Payer: Self-pay

## 2020-07-11 ENCOUNTER — Encounter (HOSPITAL_COMMUNITY): Payer: Self-pay | Admitting: Cardiovascular Disease

## 2020-07-11 DIAGNOSIS — Z79899 Other long term (current) drug therapy: Secondary | ICD-10-CM | POA: Diagnosis not present

## 2020-07-11 DIAGNOSIS — K760 Fatty (change of) liver, not elsewhere classified: Secondary | ICD-10-CM | POA: Insufficient documentation

## 2020-07-11 DIAGNOSIS — D649 Anemia, unspecified: Secondary | ICD-10-CM | POA: Diagnosis not present

## 2020-07-11 DIAGNOSIS — I352 Nonrheumatic aortic (valve) stenosis with insufficiency: Secondary | ICD-10-CM | POA: Diagnosis present

## 2020-07-11 DIAGNOSIS — E785 Hyperlipidemia, unspecified: Secondary | ICD-10-CM | POA: Insufficient documentation

## 2020-07-11 DIAGNOSIS — I10 Essential (primary) hypertension: Secondary | ICD-10-CM | POA: Diagnosis not present

## 2020-07-11 DIAGNOSIS — I350 Nonrheumatic aortic (valve) stenosis: Secondary | ICD-10-CM | POA: Diagnosis not present

## 2020-07-11 DIAGNOSIS — I2510 Atherosclerotic heart disease of native coronary artery without angina pectoris: Secondary | ICD-10-CM | POA: Diagnosis not present

## 2020-07-11 DIAGNOSIS — K219 Gastro-esophageal reflux disease without esophagitis: Secondary | ICD-10-CM | POA: Diagnosis not present

## 2020-07-11 DIAGNOSIS — R0602 Shortness of breath: Secondary | ICD-10-CM | POA: Insufficient documentation

## 2020-07-11 DIAGNOSIS — I480 Paroxysmal atrial fibrillation: Secondary | ICD-10-CM | POA: Diagnosis not present

## 2020-07-11 DIAGNOSIS — I272 Pulmonary hypertension, unspecified: Secondary | ICD-10-CM | POA: Insufficient documentation

## 2020-07-11 DIAGNOSIS — Z955 Presence of coronary angioplasty implant and graft: Secondary | ICD-10-CM | POA: Insufficient documentation

## 2020-07-11 DIAGNOSIS — Z6839 Body mass index (BMI) 39.0-39.9, adult: Secondary | ICD-10-CM | POA: Insufficient documentation

## 2020-07-11 DIAGNOSIS — G4730 Sleep apnea, unspecified: Secondary | ICD-10-CM | POA: Diagnosis not present

## 2020-07-11 DIAGNOSIS — Z87891 Personal history of nicotine dependence: Secondary | ICD-10-CM | POA: Insufficient documentation

## 2020-07-11 DIAGNOSIS — E669 Obesity, unspecified: Secondary | ICD-10-CM | POA: Diagnosis not present

## 2020-07-11 HISTORY — PX: RIGHT/LEFT HEART CATH AND CORONARY ANGIOGRAPHY: CATH118266

## 2020-07-11 LAB — POCT I-STAT, CHEM 8
BUN: 17 mg/dL (ref 8–23)
Calcium, Ion: 1.3 mmol/L (ref 1.15–1.40)
Chloride: 106 mmol/L (ref 98–111)
Creatinine, Ser: 0.8 mg/dL (ref 0.44–1.00)
Glucose, Bld: 127 mg/dL — ABNORMAL HIGH (ref 70–99)
HCT: 40 % (ref 36.0–46.0)
Hemoglobin: 13.6 g/dL (ref 12.0–15.0)
Potassium: 3.8 mmol/L (ref 3.5–5.1)
Sodium: 146 mmol/L — ABNORMAL HIGH (ref 135–145)
TCO2: 25 mmol/L (ref 22–32)

## 2020-07-11 LAB — POCT I-STAT EG7
Acid-Base Excess: 2 mmol/L (ref 0.0–2.0)
Bicarbonate: 27.5 mmol/L (ref 20.0–28.0)
Calcium, Ion: 1.19 mmol/L (ref 1.15–1.40)
HCT: 39 % (ref 36.0–46.0)
Hemoglobin: 13.3 g/dL (ref 12.0–15.0)
O2 Saturation: 71 %
Potassium: 3.8 mmol/L (ref 3.5–5.1)
Sodium: 145 mmol/L (ref 135–145)
TCO2: 29 mmol/L (ref 22–32)
pCO2, Ven: 47 mmHg (ref 44.0–60.0)
pH, Ven: 7.375 (ref 7.250–7.430)
pO2, Ven: 38 mmHg (ref 32.0–45.0)

## 2020-07-11 LAB — POCT I-STAT 7, (LYTES, BLD GAS, ICA,H+H)
Acid-Base Excess: 1 mmol/L (ref 0.0–2.0)
Bicarbonate: 26.4 mmol/L (ref 20.0–28.0)
Calcium, Ion: 1.22 mmol/L (ref 1.15–1.40)
HCT: 39 % (ref 36.0–46.0)
Hemoglobin: 13.3 g/dL (ref 12.0–15.0)
O2 Saturation: 93 %
Potassium: 3.9 mmol/L (ref 3.5–5.1)
Sodium: 144 mmol/L (ref 135–145)
TCO2: 28 mmol/L (ref 22–32)
pCO2 arterial: 43.2 mmHg (ref 32.0–48.0)
pH, Arterial: 7.394 (ref 7.350–7.450)
pO2, Arterial: 69 mmHg — ABNORMAL LOW (ref 83.0–108.0)

## 2020-07-11 LAB — POTASSIUM: Potassium: 3.8 mmol/L (ref 3.5–5.1)

## 2020-07-11 SURGERY — RIGHT/LEFT HEART CATH AND CORONARY ANGIOGRAPHY
Anesthesia: LOCAL

## 2020-07-11 MED ORDER — HEPARIN SODIUM (PORCINE) 1000 UNIT/ML IJ SOLN
INTRAMUSCULAR | Status: DC | PRN
  Administered 2020-07-11: 5000 [IU] via INTRAVENOUS

## 2020-07-11 MED ORDER — LIDOCAINE HCL (PF) 1 % IJ SOLN
INTRAMUSCULAR | Status: DC | PRN
  Administered 2020-07-11: 4 mL

## 2020-07-11 MED ORDER — SODIUM CHLORIDE 0.9 % WEIGHT BASED INFUSION
3.0000 mL/kg/h | INTRAVENOUS | Status: AC
  Administered 2020-07-11: 3 mL/kg/h via INTRAVENOUS

## 2020-07-11 MED ORDER — ASPIRIN 81 MG PO CHEW: 81.0000 mg | CHEWABLE_TABLET | ORAL | Status: DC

## 2020-07-11 MED ORDER — HEPARIN (PORCINE) IN NACL 1000-0.9 UT/500ML-% IV SOLN
INTRAVENOUS | Status: DC | PRN
  Administered 2020-07-11 (×2): 500 mL

## 2020-07-11 MED ORDER — SODIUM CHLORIDE 0.9 % IV SOLN: 250.0000 mL | INTRAVENOUS | Status: DC | PRN

## 2020-07-11 MED ORDER — SODIUM CHLORIDE 0.9 % WEIGHT BASED INFUSION: 1.0000 mL/kg/h | INTRAVENOUS | Status: DC

## 2020-07-11 MED ORDER — SODIUM CHLORIDE 0.9% FLUSH: 3.0000 mL | INTRAVENOUS | Status: DC | PRN

## 2020-07-11 MED ORDER — METOPROLOL SUCCINATE ER 50 MG PO TB24: ORAL_TABLET | ORAL | 1 refills | Status: DC

## 2020-07-11 MED ORDER — SODIUM CHLORIDE 0.9% FLUSH: 3.0000 mL | Freq: Two times a day (BID) | INTRAVENOUS | Status: DC

## 2020-07-11 MED ORDER — HYDRALAZINE HCL 20 MG/ML IJ SOLN: 10.0000 mg | INTRAMUSCULAR | Status: DC | PRN

## 2020-07-11 MED ORDER — FENTANYL CITRATE (PF) 100 MCG/2ML IJ SOLN
INTRAMUSCULAR | Status: DC | PRN
  Administered 2020-07-11 (×2): 25 ug via INTRAVENOUS

## 2020-07-11 MED ORDER — SODIUM CHLORIDE 0.9 % IV BOLUS
250.0000 mL | Freq: Once | INTRAVENOUS | Status: AC
  Administered 2020-07-11: 250 mL via INTRAVENOUS

## 2020-07-11 MED ORDER — HEPARIN SODIUM (PORCINE) 1000 UNIT/ML IJ SOLN
INTRAMUSCULAR | Status: AC
  Filled 2020-07-11: qty 1

## 2020-07-11 MED ORDER — MIDAZOLAM HCL 2 MG/2ML IJ SOLN
INTRAMUSCULAR | Status: DC | PRN
  Administered 2020-07-11 (×2): 1 mg via INTRAVENOUS

## 2020-07-11 MED ORDER — FENTANYL CITRATE (PF) 100 MCG/2ML IJ SOLN
INTRAMUSCULAR | Status: AC
  Filled 2020-07-11: qty 2

## 2020-07-11 MED ORDER — LABETALOL HCL 5 MG/ML IV SOLN: 10.0000 mg | INTRAVENOUS | Status: DC | PRN

## 2020-07-11 MED ORDER — ACETAMINOPHEN 325 MG PO TABS: 650.0000 mg | ORAL_TABLET | ORAL | Status: DC | PRN

## 2020-07-11 MED ORDER — HEPARIN (PORCINE) IN NACL 1000-0.9 UT/500ML-% IV SOLN
INTRAVENOUS | Status: AC
  Filled 2020-07-11: qty 1000

## 2020-07-11 MED ORDER — IOHEXOL 350 MG/ML SOLN
INTRAVENOUS | Status: DC | PRN
  Administered 2020-07-11: 40 mL via INTRA_ARTERIAL

## 2020-07-11 MED ORDER — VERAPAMIL HCL 2.5 MG/ML IV SOLN
INTRAVENOUS | Status: AC
  Filled 2020-07-11: qty 2

## 2020-07-11 MED ORDER — ONDANSETRON HCL 4 MG/2ML IJ SOLN: 4.0000 mg | Freq: Four times a day (QID) | INTRAMUSCULAR | Status: DC | PRN

## 2020-07-11 MED ORDER — VERAPAMIL HCL 2.5 MG/ML IV SOLN
INTRAVENOUS | Status: DC | PRN
  Administered 2020-07-11: 10 mL via INTRA_ARTERIAL

## 2020-07-11 MED ORDER — SODIUM CHLORIDE 0.9 % IV SOLN
INTRAVENOUS | Status: AC
  Filled 2020-07-11: qty 2

## 2020-07-11 MED ORDER — LIDOCAINE HCL (PF) 1 % IJ SOLN
INTRAMUSCULAR | Status: AC
  Filled 2020-07-11: qty 30

## 2020-07-11 MED ORDER — MIDAZOLAM HCL 2 MG/2ML IJ SOLN
INTRAMUSCULAR | Status: AC
  Filled 2020-07-11: qty 2

## 2020-07-11 SURGICAL SUPPLY — 12 items
CATH 5FR JL3.5 JR4 ANG PIG MP (CATHETERS) ×2 IMPLANT
CATH INFINITI 5FR AL1 (CATHETERS) ×2 IMPLANT
CATH SWAN DBL LUMAN 5F 110 (CATHETERS) ×2 IMPLANT
DEVICE RAD COMP TR BAND LRG (VASCULAR PRODUCTS) ×2 IMPLANT
GLIDESHEATH SLEND SS 6F .021 (SHEATH) ×4 IMPLANT
GUIDEWIRE INQWIRE 1.5J.035X260 (WIRE) ×1 IMPLANT
INQWIRE 1.5J .035X260CM (WIRE) ×2
KIT HEART LEFT (KITS) ×2 IMPLANT
PACK CARDIAC CATHETERIZATION (CUSTOM PROCEDURE TRAY) ×2 IMPLANT
TRANSDUCER W/STOPCOCK (MISCELLANEOUS) ×2 IMPLANT
TUBING CIL FLEX 10 FLL-RA (TUBING) ×2 IMPLANT
WIRE EMERALD ST .035X150CM (WIRE) ×2 IMPLANT

## 2020-07-11 NOTE — Progress Notes (Signed)
Late entry- Nurse paged with patient request to redraw potassium while in short stay. Recently elevated outpatient K of 5.4, instructed to decrease V8 intake. Recheck drawn. Nurse also reported BP running low and has touched base with Dr. Excell Seltzer for further orders and management. Saline bolus given with improvement. Will await potassium level and relay when resulted. Paquita Printy PA-C

## 2020-07-11 NOTE — Interval H&P Note (Signed)
History and Physical Interval Note:  07/11/2020 7:33 AM  Victoria Delgado  has presented today for surgery, with the diagnosis of Aortic stenosis.  The various methods of treatment have been discussed with the patient and family. After consideration of risks, benefits and other options for treatment, the patient has consented to  Procedure(s): RIGHT/LEFT HEART CATH AND CORONARY ANGIOGRAPHY (N/A) as a surgical intervention.  The patient's history has been reviewed, patient examined, no change in status, stable for surgery.  I have reviewed the patient's chart and labs.  Questions were answered to the patient's satisfaction.     Tonny Bollman

## 2020-07-11 NOTE — Discharge Instructions (Signed)
Radial Site Care  This sheet gives you information about how to care for yourself after your procedure. Your health care provider may also give you more specific instructions. If you have problems or questions, contact your health care provider. What can I expect after the procedure? After the procedure, it is common to have:  Bruising and tenderness at the catheter insertion area. Follow these instructions at home: Medicines  Take over-the-counter and prescription medicines only as told by your health care provider. Insertion site care  Follow instructions from your health care provider about how to take care of your insertion site. Make sure you: ? Wash your hands with soap and water before you change your bandage (dressing). If soap and water are not available, use hand sanitizer. ? Change your dressing as told by your health care provider. ? Leave stitches (sutures), skin glue, or adhesive strips in place. These skin closures may need to stay in place for 2 weeks or longer. If adhesive strip edges start to loosen and curl up, you may trim the loose edges. Do not remove adhesive strips completely unless your health care provider tells you to do that.  Check your insertion site every day for signs of infection. Check for: ? Redness, swelling, or pain. ? Fluid or blood. ? Pus or a bad smell. ? Warmth.  Do not take baths, swim, or use a hot tub until your health care provider approves.  You may shower 24-48 hours after the procedure, or as directed by your health care provider. ? Remove the dressing and gently wash the site with plain soap and water. ? Pat the area dry with a clean towel. ? Do not rub the site. That could cause bleeding.  Do not apply powder or lotion to the site. Activity   For 24 hours after the procedure, or as directed by your health care provider: ? Do not flex or bend the affected arm. ? Do not push or pull heavy objects with the affected arm. ? Do not  drive yourself home from the hospital or clinic. You may drive 24 hours after the procedure unless your health care provider tells you not to. ? Do not operate machinery or power tools.  Do not lift anything that is heavier than 10 lb (4.5 kg), or the limit that you are told, until your health care provider says that it is safe.  Ask your health care provider when it is okay to: ? Return to work or school. ? Resume usual physical activities or sports. ? Resume sexual activity. General instructions  If the catheter site starts to bleed, raise your arm and put firm pressure on the site. If the bleeding does not stop, get help right away. This is a medical emergency.  If you went home on the same day as your procedure, a responsible adult should be with you for the first 24 hours after you arrive home.  Keep all follow-up visits as told by your health care provider. This is important. Contact a health care provider if:  You have a fever.  You have redness, swelling, or yellow drainage around your insertion site. Get help right away if:  You have unusual pain at the radial site.  The catheter insertion area swells very fast.  The insertion area is bleeding, and the bleeding does not stop when you hold steady pressure on the area.  Your arm or hand becomes pale, cool, tingly, or numb. These symptoms may represent a serious problem   that is an emergency. Do not wait to see if the symptoms will go away. Get medical help right away. Call your local emergency services (911 in the [place] Do not drive yourself to the hospital. Summary  After the procedure, it is common to have bruising and tenderness at the site.  Follow instructions from your health care provider about how to take care of your radial site wound. Check the wound every day for signs of infection.  Do not lift anything that is heavier than 10 lb (4.5 kg), or the limit that you are told, until your health care provider says  that it is safe. This information is not intended to replace advice given to you by your health care provider. Make sure you discuss any questions you have with your health care provider. Document Revised: 12/25/2017 Document Reviewed: 12/25/2017 Elsevier Patient Education  2020 [employer]  

## 2020-08-18 ENCOUNTER — Ambulatory Visit: Payer: Medicare Other | Admitting: Cardiology

## 2020-08-19 ENCOUNTER — Ambulatory Visit: Payer: Medicare Other | Admitting: Cardiology

## 2020-08-31 ENCOUNTER — Ambulatory Visit: Payer: Medicare Other | Admitting: Internal Medicine

## 2020-08-31 ENCOUNTER — Encounter: Payer: Self-pay | Admitting: Internal Medicine

## 2020-08-31 VITALS — BP 142/70 | HR 58 | Ht 68.0 in | Wt 262.2 lb

## 2020-08-31 DIAGNOSIS — D509 Iron deficiency anemia, unspecified: Secondary | ICD-10-CM

## 2020-08-31 DIAGNOSIS — I350 Nonrheumatic aortic (valve) stenosis: Secondary | ICD-10-CM | POA: Diagnosis not present

## 2020-08-31 DIAGNOSIS — K219 Gastro-esophageal reflux disease without esophagitis: Secondary | ICD-10-CM

## 2020-08-31 DIAGNOSIS — K921 Melena: Secondary | ICD-10-CM | POA: Diagnosis not present

## 2020-08-31 MED ORDER — OMEPRAZOLE 20 MG PO CPDR
20.0000 mg | DELAYED_RELEASE_CAPSULE | Freq: Every day | ORAL | 3 refills | Status: DC
Start: 2020-08-31 — End: 2021-02-10

## 2020-08-31 NOTE — Patient Instructions (Signed)
We have sent the following medications to your pharmacy for you to pick up at your convenience:  Omeprazole  Please contact the office after you have been cleared by your cardiologist

## 2020-08-31 NOTE — Progress Notes (Signed)
HISTORY OF PRESENT ILLNESS:  Victoria Delgado is a 79 y.o. female with multiple significant medical problems including, but not limited to, coronary artery disease with prior coronary artery innervation, recurrent atrial fibrillation previously on chronic oral anticoagulation, morbid obesity, sleep apnea, hypertension, and severe aortic stenosis for which she is scheduled for valve replacement at [facility] in 2 weeks.  She is sent here today by her primary care physician Dr. Francesco Sor regarding remote problems with dark stools and what sounds like iron deficiency anemia.  I have reviewed multiple outside records laboratories.  The laboratories from her PCP are being requested.  In brief, she had problems with atrial fibrillation in 2008.  This responded to medical therapy.  Coronary artery stenting in 2017.  Recurrent atrial fibrillation around December 2020.  Placed on Eliquis.  2 months later described dark stools.  Seen by her PCP in April.  Eliquis stopped.  Found to be anemic with low ferritin (labs to be requested).  Placed on oral iron.  No further dark stools.  Extensive cardiac work-up regarding aortic stenosis.  Deemed severe.  Evaluated at [facility].  Scheduled for transcatheter aortic valve replacement at [facility] September 13, 2020.  Review of recent blood work from August 2021 shows normal hemoglobin levels of 13.3.  Normal MCV of 89.  CT scan of the abdomen pelvis with and without contrast October 2022 follow-up adrenal mass revealed benign adrenal adenoma.  Patient does have reflux for which she takes Pepcid.  Has been intolerant to Nexium and pantoprazole.  Has not tried omeprazole, she does not believe.  Has alternating bowel habits which are unchanged for many years.  No rectal bleeding.  She did have colonoscopy 2003 which was normal.  Cologuard testing 4 years ago was negative.  She has completed her Covid vaccination series  REVIEW OF SYSTEMS:  All non-GI ROS negative unless otherwise stated in the HPI except  for heart murmur, back pain, shortness of breath, hair loss  Past Medical History:  Diagnosis Date  . Abdominal pain   . Arthritis    osteoarthritis-hips,knees, back, hands  . Asymmetric septal hypertrophy (HCC)   . Atrial fibrillation ([place])   . CAD (coronary artery disease)    2 drug-eluting stents placed in the circumflex leading into a marginal.  May 2017.  [name].   catheterization on 03/19/2018, this showed patent stent in the proximal to mid left circumflex artery, 20% ostial left circumflex artery disease, 40% OM 3 disease, widely patent stent in the ostial D1, 20% ostial to proximal LAD disease, 20% mid LAD disease.  . Cataract    corrective surgery done  . Fatty liver   . Gallstones   . GERD (gastroesophageal reflux disease)   . GERD (gastroesophageal reflux disease)   . Heart palpitations   . Hepatitis    hepatitis A; past hx.,many yrs ago ? food source  . Hernia, hiatal   . Hyperlipidemia   . Hypertension   . Obesity   . Sleep apnea    CPAP    Past Surgical History:  Procedure Laterality Date  . APPENDECTOMY  2004  . CATARACT EXTRACTION W/ INTRAOCULAR LENS IMPLANT     both eyes  . CHOLECYSTECTOMY    . LEFT HEART CATH AND CORONARY ANGIOGRAPHY N/A 03/19/2018   Procedure: LEFT HEART CATH AND CORONARY ANGIOGRAPHY;  Surgeon: Burnell Blanks, MD;  Location: [facility];  Service: Cardiovascular;  Laterality: N/A;  . NM MYOCAR PERF WALL MOTION  08/15/04  No ischemia  . RETINAL DETACHMENT SURGERY Bilateral    remains with slight hazy vision with lights  . RIGHT/LEFT HEART CATH AND CORONARY ANGIOGRAPHY N/A 07/11/2020   Procedure: RIGHT/LEFT HEART CATH AND CORONARY ANGIOGRAPHY;  Surgeon: Sherren Mocha, MD;  Location: [facility];  Service: Cardiovascular;  Laterality: N/A;  . TOTAL HIP ARTHROPLASTY Right 08/04/2014   Procedure: RIGHT TOTAL HIP ARTHROPLASTY ANTERIOR APPROACH;  Surgeon: Gearlean Alf, MD;  Location: [facility];  Service: Orthopedics;   Laterality: Right;  . TOTAL HIP ARTHROPLASTY Left 01/05/2015   Procedure: LEFT TOTAL HIP ARTHROPLASTY ANTERIOR APPROACH;  Surgeon: Gearlean Alf, MD;  Location: [facility];  Service: Orthopedics;  Laterality: Left;  . US ECHOCARDIOGRAPHY  06/25/2012   Moderate ASH,LV hyperdynamic,LA is mod. dilated,trace MR,TR,AI    Social History DANESSA MENSCH  reports that she has quit smoking. She has never used smokeless tobacco. She reports that she does not drink alcohol and does not use drugs.  family history includes CVA in her mother; Cancer in her father; Diabetes in an other family member; Esophageal cancer in an other family member; Hypertension in her mother; Lung cancer (age of onset: 7) in her father.  Allergies  Allergen Reactions  . Codeine Nausea Only  . Kenalog [Triamcinolone Acetonide] Other (See Comments)    Had A.fib after she received the injection."Steroid meds"  . Relafen [Nabumetone] Other (See Comments)    Edema   . Epinephrine Palpitations  . Penicillins Rash    40 years ago, injection site was red and swollen.  rash, facial/tongue/throat swelling, SOB or lightheadedness with hypotension: Yes Has patient had a PCN reaction causing immediate  Has patient had a PCN reaction causing severe rash involving mucus membranes or skin necrosis: NO Has patient had a PCN reaction that required hospitalization. No  Has patient had a PCN reaction occurring within the last 10 years: No If all of the above answers are "NO", then may proceed with Cephalosporin use.    . Tramadol Other (See Comments)    Auditory halllucinations       PHYSICAL EXAMINATION: Vital signs: BP (!) 142/70 (BP Location: Left Arm, Patient Position: Sitting, Cuff Size: Large)   Pulse (!) 58   Ht _0  (1.727 m)   Wt 262 lb 4 oz (119 kg)   BMI 39.87 kg/m   Constitutional: Pleasant, obese, no acute distress Psychiatric: alert and oriented x3, cooperative Eyes: extraocular movements intact, anicteric,  conjunctiva pink Mouth: oral pharynx moist, no lesions Neck: supple no lymphadenopathy Cardiovascular: heart regular rate and rhythm, 3/6 systolic murmur Lungs: clear to auscultation bilaterally Abdomen: soft, obese, nontender, nondistended, no obvious ascites, no peritoneal signs, normal bowel sounds, no organomegaly Rectal: Omitted Extremities: no clubbing or cyanosis.  Trace lower extremity edema bilaterally Skin: no lesions on visible extremities Neuro: No focal deficits.  Cranial nerves intact  ASSESSMENT:  1.  Reports of dark stools 7 months ago.  Resolved after discontinuation of Eliquis.  Question melena 2.  Reports of anemia and low ferritin which would be consistent with iron deficiency.  Those laboratories being requested.  Most recent hemoglobins are normal as is her MCV.  Patients with aortic stenosis or more likely to have GI AVMs.  This is a potential cause for dark stools and iron deficiency anemia. 3.  GERD.  On famotidine 4.  Normal colonoscopy 2003.  Negative Cologuard testing 2017 5.  Multiple significant medical problems including severe aortic stenosis   PLAN:  1.  Prescribe  and initiate omeprazole 20 mg daily.  This for better upper GI mucosal protection in an elderly patient with multiple comorbidities who may need antiplatelet and/or anticoagulation therapies.  As well, this will address her chronic GERD. 2.  Obtain outside laboratories to confirm iron deficiency anemia 3.  Proceed with plans for TAVR at [facility] 4.  Return to the GI clinic after cleared by cardiology.  At that time we can decide the value of endoscopic evaluations. A total time of 45 minutes was spent preparing to see the patient, reviewing a myriad of tests and outside records, obtaining comprehensive history, performing comprehensive physical examination, counseling the patient regarding her above listed complex issues, ordering medications and follow-up, and documenting clinical information in the  health record

## 2020-09-26 ENCOUNTER — Other Ambulatory Visit: Payer: Self-pay | Admitting: Cardiology

## 2020-10-17 ENCOUNTER — Encounter (HOSPITAL_COMMUNITY): Payer: Self-pay | Admitting: *Deleted

## 2020-10-17 NOTE — Progress Notes (Signed)
Received referral notification from Dr. Kizzie Bane at [facility] for this pt to participant in Cardiac Rehab with the diagnosis of 09/27/20 TAVR. Reviewed medical history in CareEverywhere  For initial consult and hospitalization admission along with her ongoing follow up appt with local providers Dr. Shari Prows and Dr. Elvera Lennox .  Pt has the following hospital follow up appt Dr. Kizzie Bane [facility] 11/[facility] (CV[facility]) and 11/16 Pricilla Riffle NP[facility] (cardiology)  Pt also sees Dr. Antoine Poche local cardiologist on 12/15. Will send MD referral form for completion along with requesting 12 lead EKG tracing. Once completed /returned and pt has completed her follow up appts satisfactorily, pt can be scheduled for Cardiac Rehab.  If delay with receiving the requested forms from [facility], will ask Dr. Antoine Poche for referral for cardiac rehab. Will forward to support staff for insurance benefits/eligibility and initial call regarding the referral process.Alanson Aly, BSN Cardiac and Emergency planning/management officer

## 2020-10-19 ENCOUNTER — Telehealth (HOSPITAL_COMMUNITY): Payer: Self-pay

## 2020-10-19 NOTE — Telephone Encounter (Signed)
Called and spoke with pt in regards to CR, pt stated she is not interested at this time due to mask requirement at [facility].  Closed referral

## 2020-10-19 NOTE — Telephone Encounter (Signed)
Pt insurance is active and benefits verified through [facility]. Co-pay $0.00, DED $0.00/$0.00 met, out of pocket $3,900.00/$885.87 met, co-insurance 0%. No pre-authorization required. Passport, 10/19/20 @ 1:33PM, IWU#[id number]  Will contact patient to see if she is interested in the Cardiac Rehab Program. If interested, patient will need to complete follow up appt.

## 2020-11-15 NOTE — Progress Notes (Signed)
Cardiology Office Note   Date:  11/16/2020   ID:  Victoria Delgado, DOB 09/21/1941, MRN [id number]  PCP:  Charlane Ferretti, DO  Cardiologist:   Rollene Rotunda, MD   No chief complaint on file.     History of Present Illness: Victoria Delgado is a 79 y.o. female who presents for follow up of CAD.  She has atrial fibrillation which she ultimately thinks was related to a steroid injection. This was in 2008. At that time she was found to have asymmetric septal hypertrophy. This has been followed conservatively.  Echocardiogram did demonstrate concentric LV hypertrophy which was moderate and moderate LAE.  In May of 2017 she was in the hospital with CAD.  She was admitted to [facility] on 5/24 after waking up with left arm pain.  She had positive troponin with  PCI to the mid LCX and 1st DX. She is on DAPT with aspirin/Effient. She had 2 drug-eluting stents placed in the circumflex leading into a marginal. This was apparently somewhat small circumflex. She also had a diagonal had a drug-eluting stent placed.  An echo in our office showed well preserved ejection fraction. There was some moderate aortic stenosis. There was no mention of hypertrophic obstruction although there is LVH.  She was in the hospital in April 2019 with chest pain.    She did have positive cardiac enzymes.   She underwent cardiac catheterization on 03/19/2018, this showed patent stent in the proximal to mid left circumflex artery, 20% ostial left circumflex artery disease, 40% OM 3 disease, widely patent stent in the ostial D1, 20% ostial to proximal LAD disease, 20% mid LAD disease.  Cardiac catheterization did confirm moderate aortic stenosis with mean gradient of 26.7 mmHg.  Medical therapy was recommended for her coronary artery disease.  She had a follow up echo and her AS was severe to critical.  She eventually had TAVR at [facility].  (23 mm Edwards Sapien S3 Ultra)  She returns now.  She is actually been feeling better than she  did before her TAVR.  She is not having any new tachypalpitations.  She thinks she would know she is in fibrillation and she not felt any of that.  She says her Cardizem was stopped because her blood pressures were low.  She was having low heart rates.  She occasionally does have some low blood pressures with 110s over 60s and occasional heart rates in the 50s.  She sometimes has some lightheadedness but she does not associated this with the low blood pressures.  She has not noticed any rapid heart rates and she thinks she would know when she is in fibrillation.  Past Medical History:  Diagnosis Date  . Abdominal pain   . Arthritis    osteoarthritis-hips,knees, back, hands  . Asymmetric septal hypertrophy (HCC)   . Atrial fibrillation (HCC)   . CAD (coronary artery disease)    2 drug-eluting stents placed in the circumflex leading into a marginal.  May 2017.  [name].   catheterization on 03/19/2018, this showed patent stent in the proximal to mid left circumflex artery, 20% ostial left circumflex artery disease, 40% OM 3 disease, widely patent stent in the ostial D1, 20% ostial to proximal LAD disease, 20% mid LAD disease.  . Cataract    corrective surgery done  . Fatty liver   . Gallstones   . GERD (gastroesophageal reflux disease)   . GERD (gastroesophageal reflux disease)   . Heart palpitations   .  Hepatitis    hepatitis A; past hx.,many yrs ago ? food source  . Hernia, hiatal   . Hyperlipidemia   . Hypertension   . Obesity   . Sleep apnea    CPAP    Past Surgical History:  Procedure Laterality Date  . APPENDECTOMY  2004  . CATARACT EXTRACTION W/ INTRAOCULAR LENS IMPLANT     both eyes  . CHOLECYSTECTOMY    . LEFT HEART CATH AND CORONARY ANGIOGRAPHY N/A 03/19/2018   Procedure: LEFT HEART CATH AND CORONARY ANGIOGRAPHY;  Surgeon: Kathleene Hazel, MD;  Location: [facility] INVASIVE CV LAB;  Service: Cardiovascular;  Laterality: N/A;  . NM MYOCAR PERF WALL MOTION  08/15/04   No  ischemia  . RETINAL DETACHMENT SURGERY Bilateral    remains with slight hazy vision with lights  . RIGHT/LEFT HEART CATH AND CORONARY ANGIOGRAPHY N/A 07/11/2020   Procedure: RIGHT/LEFT HEART CATH AND CORONARY ANGIOGRAPHY;  Surgeon: Tonny Bollman, MD;  Location: [facility] INVASIVE CV LAB;  Service: Cardiovascular;  Laterality: N/A;  . TOTAL HIP ARTHROPLASTY Right 08/04/2014   Procedure: RIGHT TOTAL HIP ARTHROPLASTY ANTERIOR APPROACH;  Surgeon: Loanne Drilling, MD;  Location: [facility] ORS;  Service: Orthopedics;  Laterality: Right;  . TOTAL HIP ARTHROPLASTY Left 01/05/2015   Procedure: LEFT TOTAL HIP ARTHROPLASTY ANTERIOR APPROACH;  Surgeon: Loanne Drilling, MD;  Location: [facility] ORS;  Service: Orthopedics;  Laterality: Left;  . US ECHOCARDIOGRAPHY  06/25/2012   Moderate ASH,LV hyperdynamic,LA is mod. dilated,trace MR,TR,AI     Current Outpatient Medications  Medication Sig Dispense Refill  . albuterol (VENTOLIN HFA) 108 (90 Base) MCG/ACT inhaler Inhale 2 puffs into the lungs every 4 (four) hours as needed for wheezing or shortness of breath.     Marland Kitchen alum & mag hydroxide-simeth (MAALOX/MYLANTA) 200-200-20 MG/5ML suspension Take 30 mLs by mouth every 6 (six) hours as needed for indigestion or heartburn.    . Ascorbic Acid (VITAMIN C) 1000 MG tablet Take 1,000 mg by mouth daily.    Marland Kitchen aspirin EC 81 MG tablet Take 81 mg by mouth daily. Swallow whole.    . cholecalciferol (VITAMIN D3) 25 MCG (1000 UNIT) tablet Take 1,000 Units by mouth 2 (two) times daily.    . CRESTOR 20 MG tablet TAKE 1 TABLET(20 MG) BY MOUTH DAILY (Patient taking differently: Take 20 mg by mouth every evening.) 90 tablet 3  . loteprednol (LOTEMAX) 0.5 % ophthalmic suspension Place 1 drop into both eyes daily as needed (imflammation).     . metoprolol succinate (TOPROL XL) 50 MG 24 hr tablet TAKE 1 TABLET BY MOUTH EVERY MORNING AND 1/2 TABLET EVERY EVENING 135 tablet 1  . Multiple Vitamin (MULTIVITAMIN) tablet Take 1 tablet by mouth daily.    .  nitroGLYCERIN (NITROSTAT) 0.4 MG SL tablet Place 1 tablet (0.4 mg total) under the tongue every 5 (five) minutes as needed for chest pain. 25 tablet 3  . omeprazole (PRILOSEC) 20 MG capsule Take 1 capsule (20 mg total) by mouth daily. 90 capsule 3  . famotidine (PEPCID) 20 MG tablet Take 20 mg by mouth 2 (two) times daily.     . furosemide (LASIX) 20 MG tablet Take 1 tablet (20 mg total) by mouth daily. 90 tablet 3   No current facility-administered medications for this visit.    Allergies:   Codeine, Kenalog [triamcinolone acetonide], Relafen [nabumetone], Epinephrine, Penicillins, and Tramadol    ROS:  Please see the history of present illness.   Otherwise, review of systems are positive for none.  All other systems are reviewed and negative.    PHYSICAL EXAM: VS:  BP 121/71   Pulse 70   Temp (!) 97.2 F (36.2 C)   Ht 5\' 8"  (1.727 m)   Wt 268 lb (121.6 kg)   SpO2 94%   BMI 40.75 kg/m  , BMI Body mass index is 40.75 kg/m. GENERAL:  Well appearing NECK:  No jugular venous distention, waveform within normal limits, carotid upstroke brisk and symmetric, no bruits, no thyromegaly LUNGS:  Clear to auscultation bilaterally CHEST:  Unremarkable HEART:  PMI not displaced or sustained,S1 and S2 within normal limits, no S3, no S4, no clicks, no rubs, 3 out of 6 apical systolic early peaking peaking murmur radiating out the aortic outflow tract, no diastolic murmurs ABD:  Flat, positive bowel sounds normal in frequency in pitch, no bruits, no rebound, no guarding, no midline pulsatile mass, no hepatomegaly, no splenomegaly EXT:  2 plus pulses throughout, trace edema, no cyanosis no clubbing   EKG:  EKG is not ordered today.   Recent Labs: 07/07/2020: Platelets 234 07/11/2020: BUN 17; Creatinine, Ser 0.80; Hemoglobin 13.3; Potassium 3.8; Sodium 145    Lipid Panel    Component Value Date/Time   CHOL 168 06/02/2019 1245   TRIG 212 (H) 06/02/2019 1245   HDL 39 (L) 06/02/2019 1245    CHOLHDL 4.3 06/02/2019 1245   CHOLHDL 5.1 03/17/2018 0034   VLDL 37 03/17/2018 0034   LDLCALC 87 06/02/2019 1245      Wt Readings from Last 3 Encounters:  11/16/20 268 lb (121.6 kg)  08/31/20 262 lb 4 oz (119 kg)  07/11/20 255 lb (115.7 kg)      Other studies Reviewed: Additional studies/ records that were reviewed today include: Review of [facility] records Review of the above records demonstrates:  Please see elsewhere in the note.     ASSESSMENT AND PLAN:  ATRIAL FIB:   She has Ms. FOLASADE MOOTY has a CHA2DS2 - VASc score 5.   Unfortunately she cannot take anticoagulation.  She has not yet completed colonoscopy or endoscopy and was told not to do this for a while post procedure.  At this point she symptomatically think she would know when she is in fibrillation.  I think she is high risk for bleeding and so I do not think we can restart the Eliquis.  She now will revisit this in the months ahead.    CAD:  The patient has no new sypmtoms.  No further cardiovascular testing is indicated.  We will continue with aggressive risk reduction and meds as listed.  LVH- This was severe on the echo.  This will be followed up with repeat echoes at [facility] when she has follow-up next year of her TAVR.  AS - She understands endocarditis prophylaxis  Dyslipidemia - She tells me the most recent LDL was in the 90s.  She is really hesitant to take increase Crestor.  I told her we would reassess this at the next visit and I might need to consider PCSK9 but I will give her a chance to follow diet and exercise.  Hypertension  Her blood pressure is  controlled.  I think she is tolerating current low-dose of beta-blocker and I will continue this.  Covid Education She has had her vaccines.  Current medicines are reviewed at length with the patient today.  The patient does not have concerns regarding medicines.  The following changes have been made:  no change  Labs/ tests ordered today  include:    No orders of the defined types were placed in this encounter.    Disposition:   FU with me in 4 months.     Signed, Rollene Rotunda, MD  11/16/2020 9:18 PM    [facility]

## 2020-11-16 ENCOUNTER — Encounter: Payer: Self-pay | Admitting: Cardiology

## 2020-11-16 ENCOUNTER — Other Ambulatory Visit: Payer: Self-pay

## 2020-11-16 ENCOUNTER — Ambulatory Visit: Payer: Medicare Other | Admitting: Cardiology

## 2020-11-16 VITALS — BP 121/71 | HR 70 | Temp 97.2°F | Ht 68.0 in | Wt 268.0 lb

## 2020-11-16 DIAGNOSIS — Z953 Presence of xenogenic heart valve: Secondary | ICD-10-CM | POA: Diagnosis not present

## 2020-11-16 MED ORDER — FUROSEMIDE 20 MG PO TABS: 20.0000 mg | ORAL_TABLET | Freq: Every day | ORAL | 3 refills | Status: DC

## 2020-11-16 NOTE — Patient Instructions (Addendum)
Medication Instructions:  Decrease lasix to 20mg  daily *If you need a refill on your cardiac medications before your next appointment, please call your pharmacy*  Lab Work: None ordered this visit  Testing/Procedures: None ordered this visit  Follow-Up: At [facility], you and your health needs are our priority.  As part of our continuing mission to provide you with exceptional heart care, we have created designated Provider Care Teams.  These Care Teams include your primary Cardiologist (physician) and Advanced Practice Providers (APPs -  Physician Assistants and Nurse Practitioners) who all work together to provide you with the care you need, when you need it.   Your next appointment:   4 Months with Dr. [facility]

## 2020-12-04 ENCOUNTER — Other Ambulatory Visit: Payer: Self-pay | Admitting: Cardiology

## 2020-12-18 ENCOUNTER — Emergency Department (HOSPITAL_BASED_OUTPATIENT_CLINIC_OR_DEPARTMENT_OTHER)
Admission: EM | Admit: 2020-12-18 | Discharge: 2020-12-18 | Disposition: A | Discharge status: Home / Self Care | Payer: Medicare Other | Attending: Emergency Medicine | Admitting: Emergency Medicine

## 2020-12-18 ENCOUNTER — Encounter (HOSPITAL_BASED_OUTPATIENT_CLINIC_OR_DEPARTMENT_OTHER): Payer: Self-pay

## 2020-12-18 ENCOUNTER — Other Ambulatory Visit: Payer: Self-pay

## 2020-12-18 ENCOUNTER — Emergency Department (HOSPITAL_BASED_OUTPATIENT_CLINIC_OR_DEPARTMENT_OTHER): Payer: Medicare Other

## 2020-12-18 DIAGNOSIS — I10 Essential (primary) hypertension: Secondary | ICD-10-CM | POA: Diagnosis not present

## 2020-12-18 DIAGNOSIS — Z7982 Long term (current) use of aspirin: Secondary | ICD-10-CM | POA: Insufficient documentation

## 2020-12-18 DIAGNOSIS — Z87891 Personal history of nicotine dependence: Secondary | ICD-10-CM | POA: Insufficient documentation

## 2020-12-18 DIAGNOSIS — Z96643 Presence of artificial hip joint, bilateral: Secondary | ICD-10-CM | POA: Insufficient documentation

## 2020-12-18 DIAGNOSIS — K219 Gastro-esophageal reflux disease without esophagitis: Secondary | ICD-10-CM | POA: Insufficient documentation

## 2020-12-18 DIAGNOSIS — I2510 Atherosclerotic heart disease of native coronary artery without angina pectoris: Secondary | ICD-10-CM | POA: Insufficient documentation

## 2020-12-18 DIAGNOSIS — Z79899 Other long term (current) drug therapy: Secondary | ICD-10-CM | POA: Insufficient documentation

## 2020-12-18 DIAGNOSIS — R1012 Left upper quadrant pain: Secondary | ICD-10-CM | POA: Diagnosis not present

## 2020-12-18 DIAGNOSIS — Z9049 Acquired absence of other specified parts of digestive tract: Secondary | ICD-10-CM | POA: Insufficient documentation

## 2020-12-18 LAB — CBC WITH DIFFERENTIAL/PLATELET
Abs Immature Granulocytes: 0.05 10*3/uL (ref 0.00–0.07)
Basophils Absolute: 0 10*3/uL (ref 0.0–0.1)
Basophils Relative: 0 %
Eosinophils Absolute: 0.1 10*3/uL (ref 0.0–0.5)
Eosinophils Relative: 1 %
HCT: 47.7 % — ABNORMAL HIGH (ref 36.0–46.0)
Hemoglobin: 15.9 g/dL — ABNORMAL HIGH (ref 12.0–15.0)
Immature Granulocytes: 1 %
Lymphocytes Relative: 24 %
Lymphs Abs: 1.9 10*3/uL (ref 0.7–4.0)
MCH: 30.1 pg (ref 26.0–34.0)
MCHC: 33.3 g/dL (ref 30.0–36.0)
MCV: 90.2 fL (ref 80.0–100.0)
Monocytes Absolute: 0.6 10*3/uL (ref 0.1–1.0)
Monocytes Relative: 8 %
Neutro Abs: 5.2 10*3/uL (ref 1.7–7.7)
Neutrophils Relative %: 66 %
Platelets: 235 10*3/uL (ref 150–400)
RBC: 5.29 MIL/uL — ABNORMAL HIGH (ref 3.87–5.11)
RDW: 13.2 % (ref 11.5–15.5)
WBC: 7.8 10*3/uL (ref 4.0–10.5)
nRBC: 0 % (ref 0.0–0.2)

## 2020-12-18 LAB — COMPREHENSIVE METABOLIC PANEL
ALT: 23 U/L (ref 0–44)
AST: 29 U/L (ref 15–41)
Albumin: 4.4 g/dL (ref 3.5–5.0)
Alkaline Phosphatase: 85 U/L (ref 38–126)
Anion gap: 10 (ref 5–15)
BUN: 15 mg/dL (ref 8–23)
CO2: 25 mmol/L (ref 22–32)
Calcium: 9.3 mg/dL (ref 8.9–10.3)
Chloride: 103 mmol/L (ref 98–111)
Creatinine, Ser: 0.92 mg/dL (ref 0.44–1.00)
GFR, Estimated: >60 mL/min (ref >60)
Glucose, Bld: 154 mg/dL — ABNORMAL HIGH (ref 70–99)
Potassium: 3.9 mmol/L (ref 3.5–5.1)
Sodium: 138 mmol/L (ref 135–145)
Total Bilirubin: 0.8 mg/dL (ref 0.3–1.2)
Total Protein: 7.5 g/dL (ref 6.5–8.1)

## 2020-12-18 LAB — URINALYSIS, ROUTINE W REFLEX MICROSCOPIC
Bilirubin Urine: NEGATIVE
Glucose, UA: NEGATIVE mg/dL
Hgb urine dipstick: NEGATIVE
Ketones, ur: NEGATIVE mg/dL
Leukocytes,Ua: NEGATIVE
Nitrite: NEGATIVE
Protein, ur: NEGATIVE mg/dL
Specific Gravity, Urine: 1.01 (ref 1.005–1.030)
pH: 6.5 (ref 5.0–8.0)

## 2020-12-18 LAB — LIPASE, BLOOD: Lipase: 39 U/L (ref 11–51)

## 2020-12-18 MED ORDER — IOHEXOL 350 MG/ML SOLN
100.0000 mL | Freq: Once | INTRAVENOUS | Status: AC
  Administered 2020-12-18: 100 mL via INTRAVENOUS

## 2020-12-18 NOTE — ED Triage Notes (Signed)
Pt c/o LLQ x 4 days. Pain is exacerbated by standing up or bending over, and it is palliated when lying flat. Pain is described as a "searing hot pain." Denies N/V/D or blood in BMs.

## 2020-12-18 NOTE — ED Provider Notes (Signed)
[facility] EMERGENCY DEPARTMENT Provider Note   CSN: [id number] Arrival date & time: 12/18/20  1101     History Chief Complaint  Patient presents with  . Abdominal Pain    Victoria Delgado is a 80 y.o. female with past medical history significant for CAD s/p PCI, paroxysmal atrial fibrillation on no longer on Eliquis, HTN, HLD, OSA, GI bleed, and severe aortic stenosis s/p TAVR procedure who presents to the ED with intermittent episodes of severe LUQ abdominal pain.  On my examination, patient reports that over the course the past week, she has been experiencing intermittent episodes of very severe left upper quadrant "searing, burning" pain.  She states that oftentimes they are precipitated by standing up or bending over, but no other obvious aggravating or alleviating symptoms.  She states that she has not taken anything for her pain, because after a few seconds her severe pain begins to subside.  She states that her last colonoscopy was 15 years ago and she does not recall any particularly abnormal findings.  She had a Cologuard testing a few years ago which was also unremarkable.  While she endorses a history of mild constipation, she has been taking Dulcolax as needed and last evening had a soft, brown bowel movement.  She specifically denies any hematochezia or melena.  She endorses mild fatigue and weakness over the course of the past few days, but denies any fevers or chills.  She also denies any nausea, emesis, urinary symptoms, changes in her bowel habits, hematuria, vaginal bleeding or discharge, pelvic pain, rash, chest pain, shortness of breath, room spinning dizziness, numbness or weakness, or other symptoms.  Patient reports that she had followed up with her primary care provider last week, but they were unsure of what was causing her symptoms.  She states that her primary concern is infection given that she was informed by her cardiologist that her TAVR procedure 09/27/2020  could lead to complications including infection.  Patient is denying any pain at this time, but states that she would not have given through this now if she had not been experiencing excruciatingly severe intermittent pains.  HPI     Past Medical History:  Diagnosis Date  . Abdominal pain   . Arthritis    osteoarthritis-hips,knees, back, hands  . Asymmetric septal hypertrophy (HCC)   . Atrial fibrillation (HCC)   . CAD (coronary artery disease)    2 drug-eluting stents placed in the circumflex leading into a marginal.  May 2017.  [name].   catheterization on 03/19/2018, this showed patent stent in the proximal to mid left circumflex artery, 20% ostial left circumflex artery disease, 40% OM 3 disease, widely patent stent in the ostial D1, 20% ostial to proximal LAD disease, 20% mid LAD disease.  . Cataract    corrective surgery done  . Fatty liver   . Gallstones   . GERD (gastroesophageal reflux disease)   . GERD (gastroesophageal reflux disease)   . Heart palpitations   . Hepatitis    hepatitis A; past hx.,many yrs ago ? food source  . Hernia, hiatal   . Hyperlipidemia   . Hypertension   . Obesity   . Sleep apnea    CPAP    Patient Active Problem List   Diagnosis Date Noted  . Dyslipidemia 05/18/2020  . Educated about COVID-19 virus infection 05/18/2020  . Pulmonary nodule, right 10/01/2019  . Prediabetes 05/05/2019  . Moderate aortic stenosis 03/19/2018  . Abnormal nuclear stress test   .  Chest pain 03/16/2018  . Unstable angina (HCC)   . Right adrenal mass (HCC) 11/05/2017  . Nonrheumatic aortic valve stenosis 10/11/2017  . Coronary artery disease involving native coronary artery of native heart without angina pectoris 10/11/2017  . LVH (left ventricular hypertrophy) 10/11/2017  . PAF (paroxysmal atrial fibrillation) (HCC) 05/03/2015  . OA (osteoarthritis) of hip 08/04/2014  . Edema 08/17/2013  . H/O bone density study 03/16/2013  . 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 Unspecified hereditary and idiopathic peripheral neuropathy 03/05/2013  . Unspecified vitamin D deficiency 03/05/2013  . Fatty infiltration of liver 02/08/2013  . Mixed hyperlipidemia 02/08/2013  . Obesity, Class III, BMI 40-49.9 (morbid obesity) (HCC) 02/08/2013  . Cellulitis of groin, right 12/30/2012  . DJD (degenerative joint disease) 12/17/2012  . GERD (gastroesophageal reflux disease) 12/17/2012  . Peripheral neuropathy 12/17/2012  . HOCM (hypertrophic obstructive cardiomyopathy) (HCC) 10/16/2012  . HTN (hypertension) 10/16/2012  . Sleep apnea 10/16/2012  . Heart palpitations     Past Surgical History:  Procedure Laterality Date  . APPENDECTOMY  2004  . CATARACT EXTRACTION W/ INTRAOCULAR LENS IMPLANT     both eyes  . CHOLECYSTECTOMY    . LEFT HEART CATH AND CORONARY ANGIOGRAPHY N/A 03/19/2018   Procedure: LEFT HEART CATH AND CORONARY ANGIOGRAPHY;  Surgeon: Kathleene Hazel, MD;  Location: [facility] INVASIVE CV LAB;  Service: Cardiovascular;  Laterality: N/A;  . NM MYOCAR PERF WALL MOTION  08/15/04   No ischemia  . RETINAL DETACHMENT SURGERY Bilateral    remains with slight hazy vision with lights  . RIGHT/LEFT HEART CATH AND CORONARY ANGIOGRAPHY N/A 07/11/2020   Procedure: RIGHT/LEFT HEART CATH AND CORONARY ANGIOGRAPHY;  Surgeon: Tonny Bollman, MD;  Location: [facility]VASIVE CV LAB;  Service: Cardiovascular;  Laterality: N/A;  . TOTAL HIP ARTHROPLASTY Right 08/04/2014   Procedure: RIGHT TOTAL HIP ARTHROPLASTY ANTERIOR APPROACH;  Surgeon: Loanne Drilling, MD;  Location: [facility] ORS;  Service: Orthopedics;  Laterality: Right;  . TOTAL HIP ARTHROPLASTY Left 01/05/2015   Procedure: LEFT TOTAL HIP ARTHROPLASTY ANTERIOR APPROACH;  Surgeon: Loanne Drilling, MD;  Location: [facility] ORS;  Service: Orthopedics;  Laterality: Left;  . US ECHOCARDIOGRAPHY  06/25/2012   Moderate ASH,LV hyperdynamic,LA is mod. dilated,trace MR,TR,AI     OB History    Gravida  2   Para  2   Term       Preterm      AB      Living        SAB      IAB      Ectopic      Multiple      Live Births              Family History  Problem Relation Age of Onset  . Hypertension Mother   . CVA Mother   . Lung cancer Father 78       asbestos exposure  . Cancer Father   . Esophageal cancer Other        nephew  . Diabetes Other        nephew  . Colon cancer Neg Hx   . Pancreatic cancer Neg Hx   . Kidney disease Neg Hx   . Liver disease Neg Hx     Social History   Tobacco Use  . Smoking status: Former Games developer  . Smokeless tobacco: Never Used  . Tobacco comment: QUIT IN 1990  Vaping Use  . Vaping Use: Never used  Substance Use Topics  . Alcohol  use: No    Alcohol/week: 0.0 standard drinks  . Drug use: No    Home Medications Prior to Admission medications   Medication Sig Start Date End Date Taking? Authorizing Provider  albuterol (VENTOLIN HFA) 108 (90 Base) MCG/ACT inhaler Inhale 2 puffs into the lungs every 4 (four) hours as needed for wheezing or shortness of breath.  12/21/19   [provider]  alum & mag hydroxide-simeth (MAALOX/MYLANTA) 200-200-20 MG/5ML suspension Take 30 mLs by mouth every 6 (six) hours as needed for indigestion or heartburn.    [provider]  Ascorbic Acid (VITAMIN C) 1000 MG tablet Take 1,000 mg by mouth daily.    [provider]  aspirin EC 81 MG tablet Take 81 mg by mouth daily. Swallow whole.    [provider]  cholecalciferol (VITAMIN D3) 25 MCG (1000 UNIT) tablet Take 1,000 Units by mouth 2 (two) times daily.    [provider]  CRESTOR 20 MG tablet TAKE 1 TABLET(20 MG) BY MOUTH DAILY 12/05/20   Rollene Rotunda, MD  famotidine (PEPCID) 20 MG tablet Take 20 mg by mouth 2 (two) times daily.     [provider]  furosemide (LASIX) 20 MG tablet Take 1 tablet (20 mg total) by mouth daily. 11/16/20   Rollene Rotunda, MD  loteprednol (LOTEMAX) 0.5 % ophthalmic suspension Place 1 drop into  both eyes daily as needed (imflammation).     [provider]  metoprolol succinate (TOPROL XL) 50 MG 24 hr tablet TAKE 1 TABLET BY MOUTH EVERY MORNING AND 1/2 TABLET EVERY EVENING 07/11/20   Hilty, Lisette Abu, MD  Multiple Vitamin (MULTIVITAMIN) tablet Take 1 tablet by mouth daily.    [provider]  nitroGLYCERIN (NITROSTAT) 0.4 MG SL tablet Place 1 tablet (0.4 mg total) under the tongue every 5 (five) minutes as needed for chest pain. 05/07/16   Rosalio Macadamia, NP  omeprazole (PRILOSEC) 20 MG capsule Take 1 capsule (20 mg total) by mouth daily. 08/31/20   Hilarie Fredrickson, MD    Allergies    Codeine, Kenalog [triamcinolone acetonide], Relafen [nabumetone], Epinephrine, Penicillins, and Tramadol  Review of Systems   Review of Systems  All other systems reviewed and are negative.   Physical Exam Updated Vital Signs BP (!) 162/65 (BP Location: Left Arm)   Pulse 65   Temp 98.2 F (36.8 C) (Oral)   Resp 20   Ht 5\' 8"  (1.727 m)   Wt 117.9 kg   SpO2 99%   BMI 39.53 kg/m   Physical Exam Vitals and nursing note reviewed. Exam conducted with a chaperone present.  Constitutional:      General: She is not in acute distress.    Appearance: Normal appearance. She is not ill-appearing.  HENT:     Head: Normocephalic and atraumatic.  Eyes:     General: No scleral icterus.    Conjunctiva/sclera: Conjunctivae normal.  Cardiovascular:     Rate and Rhythm: Normal rate and regular rhythm.     Pulses: Normal pulses.     Heart sounds: Murmur heard.    Pulmonary:     Effort: Pulmonary effort is normal.  Abdominal:     Comments: Mild, focal left-sided TTP in area of splenic flexure.  No vesicular rash concerning for zoster or other overlying skin changes.  No guarding.  Soft, nondistended.  Musculoskeletal:     Cervical back: Normal range of motion. No rigidity.     Right lower leg: No edema.  Left lower leg: No edema.  Skin:    General: Skin is dry.     Capillary  Refill: Capillary refill takes less than 2 seconds.  Neurological:     Mental Status: She is alert and oriented to person, place, and time.     GCS: GCS eye subscore is 4. GCS verbal subscore is 5. GCS motor subscore is 6.  Psychiatric:        Mood and Affect: Mood normal.        Behavior: Behavior normal.        Thought Content: Thought content normal.     ED Results / Procedures / Treatments   Labs (all labs ordered are listed, but only abnormal results are displayed) Labs Reviewed  CBC WITH DIFFERENTIAL/PLATELET - Abnormal; Notable for the following components:      Result Value   RBC 5.29 (*)    Hemoglobin 15.9 (*)    HCT 47.7 (*)    All other components within normal limits  COMPREHENSIVE METABOLIC PANEL - Abnormal; Notable for the following components:   Glucose, Bld 154 (*)    All other components within normal limits  LIPASE, BLOOD  URINALYSIS, ROUTINE W REFLEX MICROSCOPIC    EKG None  Radiology CT Angio Abd/Pel W and/or Wo Contrast  Result Date: 12/18/2020 CLINICAL DATA:  80 year old with severe pain in the left upper quadrant near splenic flexure. Evaluate for mesenteric ischemia. History of TAVR procedure. EXAM: CTA ABDOMEN AND PELVIS WITHOUT AND WITH CONTRAST TECHNIQUE: Multidetector CT imaging of the abdomen and pelvis was performed using the standard protocol during bolus administration of intravenous contrast. Multiplanar reconstructed images and MIPs were obtained and reviewed to evaluate the vascular anatomy. CONTRAST:  100mL OMNIPAQUE IOHEXOL 350 MG/ML SOLN COMPARISON:  CT 09/08/2019 FINDINGS: VASCULAR Aorta: Atherosclerotic disease in the abdominal aorta without aneurysm, dissection or significant stenosis. Celiac: Atherosclerotic plaque and narrowing at the origin of the celiac trunk. The stenosis is probably 50% or less but this area is poorly characterized. Variant anatomy with a replaced left hepatic artery coming off the left gastric artery. Right gastric  artery originates from the proximal SMA. SMA: Small amount of plaque near the origin of the SMA without significant stenosis. Renals: Atherosclerotic disease involving the renal arteries without significant stenosis. No evidence for aneurysm or dissection. Accessory left renal artery is patent and supplies the lower pole region. IMA: Patent Inflow: Atherosclerotic disease involving the bilateral iliac arteries. No significant stenosis. No evidence for aneurysm formation. Proximal Outflow: Proximal femoral arteries are patent bilaterally. Low-density structure just anterior to the right common femoral artery on sequence 4, image 125. There is a surgical clip along the anterior and posterior aspect of this collection. This collection measures roughly 3.3 x 1.9 cm. There is no blood flow within this structure. Veins: Portal venous system is patent. IVC and renal veins are patent. Normal appearance of the iliac veins. Review of the MIP images confirms the above findings. NON-VASCULAR Lower chest: Lung bases are clear. Prominent mitral annular calculus. Hepatobiliary: Gallbladder has been removed. Normal appearance of the liver without a focal lesion. No significant biliary dilatation. Extrahepatic bile duct measures 8 mm and stable since 2020. Pancreas: Unremarkable. No pancreatic ductal dilatation or surrounding inflammatory changes. Spleen: Normal in size without focal abnormality. Adrenals/Urinary Tract: Normal appearance of the left adrenal gland. Again noted is a low-density nodule in the right adrenal gland measuring up to 2.5 cm. This is compatible with a benign adenoma based on the prior examination  with non contrast imaging. Stable appearance of left kidney with low-density cyst measuring up to 2.0 cm. Mild fullness in the left renal calices is unchanged. Probable small stone in the left kidney lower pole. Negative for ureter stone. Normal appearance of the urinary bladder. Normal appearance of the right  kidney. Minimal fullness of the right renal collecting system. Stomach/Bowel: Evidence for an appendectomy. Normal appearance of the stomach. No evidence for bowel obstruction or focal bowel inflammation. Specifically, no acute abnormality in the left upper quadrant and splenic flexure region. Lymphatic: No abdominal or pelvic lymphadenopathy. Reproductive: Uterine fibroids with calcifications. No suspicious adnexal lesions. Other: Negative for free fluid.  Negative for free air. Musculoskeletal: Bilateral hip replacements are located. IMPRESSION: VASCULAR 1. Atherosclerotic disease in the abdominal aorta without aneurysm, dissection or significant stenosis. Aortic Atherosclerosis (ICD10-I70.0). 2. Main visceral arteries are patent without significant stenosis. Mild stenosis at the origin of the celiac trunk. Variant hepatic artery anatomy as described. 3. 3.3 cm low-density collection anterior to the right femoral vessels with surrounding surgical clips. This likely represents a postoperative fluid collection. No evidence for a pseudoaneurysm at this site. NON-VASCULAR 1. No acute abnormality in the abdomen or pelvis. Specifically, no acute abnormalities in left upper quadrant. No evidence for acute bowel inflammation. 2. Stable right adrenal adenoma. 3. Probable nonobstructive left kidney stone. Electronically Signed   By: Richarda Overlie M.D.   On: 12/18/2020 13:38    Procedures Procedures (including critical care time)  Medications Ordered in ED Medications  iohexol (OMNIPAQUE) 350 MG/ML injection 100 mL (100 mLs Intravenous Contrast Given 12/18/20 1237)    ED Course  I have reviewed the triage vital signs and the nursing notes.  Pertinent labs & imaging results that were available during my care of the patient were reviewed by me and considered in my medical decision making (see chart for details).    MDM Rules/Calculators/A&P                          JOEL MERICLE was evaluated in Emergency  Department on 12/18/2020 for the symptoms described in the history of present illness. She was evaluated in the context of the global COVID-19 pandemic, which necessitated consideration that the patient might be at risk for infection with the SARS-CoV-2 virus that causes COVID-19. Institutional protocols and algorithms that pertain to the evaluation of patients at risk for COVID-19 are in a state of rapid change based on information released by regulatory bodies including the CDC and federal and state organizations. These policies and algorithms were followed during the patient's care in the ED.  I personally reviewed patient's medical chart and all notes from triage and staff during today's encounter. I have also ordered and reviewed all labs and imaging that I felt to be medically necessary in the evaluation of this patient's complaints and with consideration of their with their physical exam. If needed, translation services were available and utilized.   Patient's HPI and physical exam is nonspecific, however given her history of recent TAVR procedure, concern for ischemia involving watershed area.   Laboratory work-up without evidence of anemia or leukocytosis concerning for infection.  CMP unremarkable and without evidence of renal impairment or electrolyte derangement.  CTA abdomen and pelvis is personally reviewed and without any obvious acute abnormalities involving abdomen pelvis.  Vascular study demonstrates atherosclerotic disease, but without evident aneurysm or areas of significant stenosis.  There is a 3.3 cm low-density collection anterior to  the right femoral vessels possibly representative of postoperative fluid collection.  Patient to follow-up with her primary care provider and gastroenterologist for ongoing evaluation and management.  Patient is reassured by today's work-up.  All of the evaluation and work-up results were discussed with the patient and any family at bedside.  Patient  and/or family were informed that while patient is appropriate for discharge at this time, some medical emergencies may only develop or become detectable after a period of time.  I specifically instructed patient and/or family to return to return to the ED or seek immediate medical attention for any new or worsening symptoms.  They were provided opportunity to ask any additional questions and have none at this time.  Prior to discharge patient is feeling well, agreeable with plan for discharge home.  They have expressed understanding of verbal discharge instructions as well as return precautions and are agreeable to the plan.    Final Clinical Impression(s) / ED Diagnoses Final diagnoses:  Left upper quadrant abdominal pain    Rx / DC Orders ED Discharge Orders    None       Lorelee New, PA-C 12/18/20 1403    Milagros Loll, MD 12/21/20 1751

## 2020-12-18 NOTE — Discharge Instructions (Signed)
Please follow-up with your primary care provider and gastroenterologist regarding today's encounter for ongoing evaluation and management.  I would also like for you to notify your cardiologist at [facility].  Your laboratory work-up and imaging obtained here today was largely reassuring.  Return to the ED or seek immediate medical attention should you experience any new or worsening symptoms.

## 2020-12-29 ENCOUNTER — Other Ambulatory Visit: Payer: Self-pay | Admitting: Internal Medicine

## 2021-01-02 ENCOUNTER — Other Ambulatory Visit: Payer: Self-pay | Admitting: Cardiology

## 2021-02-08 NOTE — Progress Notes (Signed)
Cardiology Office Note   Date:  02/10/2021   ID:  Victoria Delgado, DOB [birth year], MRN [id number]  PCP:  Charlane Ferretti, DO  Cardiologist:   Rollene Rotunda, MD   Chief Complaint  Patient presents with  . Shortness of Breath      History of Present Illness: Victoria Delgado is a 80 y.o. female who presents for follow up of CAD.  She has atrial fibrillation which she ultimately thinks was related to a steroid injection. This was in 2008. At that time she was found to have asymmetric septal hypertrophy. This has been followed conservatively.  Echocardiogram did demonstrate concentric LV hypertrophy which was moderate and moderate LAE.  In May of 2017 she was in the hospital with CAD.  She was admitted to [facility] on 5/24 after waking up with left arm pain.  She had positive troponin with  PCI to the mid LCX and 1st DX. She is on DAPT with aspirin/Effient. She had 2 drug-eluting stents placed in the circumflex leading into a marginal. This was apparently somewhat small circumflex. She also had a diagonal had a drug-eluting stent placed.  An echo in our office showed well preserved ejection fraction. There was some moderate aortic stenosis. There was no mention of hypertrophic obstruction although there is LVH.  She was in the hospital in April 2019 with chest pain.    She did have positive cardiac enzymes.   She underwent cardiac catheterization on 03/19/2018, this showed patent stent in the proximal to mid left circumflex artery, 20% ostial left circumflex artery disease, 40% OM 3 disease, widely patent stent in the ostial D1, 20% ostial to proximal LAD disease, 20% mid LAD disease.  Cardiac catheterization did confirm moderate aortic stenosis with mean gradient of 26.7 mmHg.  Medical therapy was recommended for her coronary artery disease.  She had a follow up echo and her AS was severe to critical.  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  (23 mm Edwards Sapien S3 Ultra)  She returns now calling  because she had SOB this week.  She said it started a few days ago.  She started noticing increasing shortness of breath.  She called [facility] and I suggested she either come there or come here.  On her own she started taking 40 mg of Lasix.  I have reduced this to 20 previously because of some low blood pressures.  She has lost about 3 pounds in about 3 days and she feels back to baseline.  She said she is not getting any salt.  She not been getting any excess fluid.  She reports getting more dyspnea with exertion but not describing classic PND or orthopnea.  She reports getting more dyspnea with exertion but not describing classic PND or orthopnea.  She has had no new chest pressure, neck or arm discomfort.  Her blood pressures have been well controlled although I reviewed her blood pressures are on the she and sometimes it does dip into the 90s systolic.  She does not have symptoms with that.   Past Medical History:  Diagnosis Date  . Abdominal pain   . Arthritis    osteoarthritis-hips,knees, back, hands  . Asymmetric septal hypertrophy (HCC)   . Atrial fibrillation (HCC)   . CAD (coronary artery disease)    2 drug-eluting stents placed in the circumflex leading into a marginal.  May 2017.  [name].   catheterization on 03/19/2018, this showed patent stent in the proximal to mid left  circumflex artery, 20% ostial left circumflex artery disease, 40% OM 3 disease, widely patent stent in the ostial D1, 20% ostial to proximal LAD disease, 20% mid LAD disease.  . Cataract    corrective surgery done  . Fatty liver   . Gallstones   . GERD (gastroesophageal reflux disease)   . GERD (gastroesophageal reflux disease)   . Heart palpitations   . Hepatitis    hepatitis A; past hx.,many yrs ago ? food source  . Hernia, hiatal   . Hyperlipidemia   . Hypertension   . Obesity   . Sleep apnea    CPAP    Past Surgical History:  Procedure Laterality Date  . APPENDECTOMY  2004  . CATARACT EXTRACTION W/  INTRAOCULAR LENS IMPLANT     both eyes  . CHOLECYSTECTOMY    . LEFT HEART CATH AND CORONARY ANGIOGRAPHY N/A 03/19/2018   Procedure: LEFT HEART CATH AND CORONARY ANGIOGRAPHY;  Surgeon: Kathleene Hazel, MD;  Location: [facility] INVASIVE CV LAB;  Service: Cardiovascular;  Laterality: N/A;  . NM MYOCAR PERF WALL MOTION  08/15/04   No ischemia  . RETINAL DETACHMENT SURGERY Bilateral    remains with slight hazy vision with lights  . RIGHT/LEFT HEART CATH AND CORONARY ANGIOGRAPHY N/A 07/11/2020   Procedure: RIGHT/LEFT HEART CATH AND CORONARY ANGIOGRAPHY;  Surgeon: Tonny Bollman, MD;  Location: [facility] INVASIVE CV LAB;  Service: Cardiovascular;  Laterality: N/A;  . TOTAL HIP ARTHROPLASTY Right 08/04/2014   Procedure: RIGHT TOTAL HIP ARTHROPLASTY ANTERIOR APPROACH;  Surgeon: Loanne Drilling, MD;  Location: [facility] ORS;  Service: Orthopedics;  Laterality: Right;  . TOTAL HIP ARTHROPLASTY Left 01/05/2015   Procedure: LEFT TOTAL HIP ARTHROPLASTY ANTERIOR APPROACH;  Surgeon: Loanne Drilling, MD;  Location: [facility] ORS;  Service: Orthopedics;  Laterality: Left;  . US ECHOCARDIOGRAPHY  06/25/2012   Moderate ASH,LV hyperdynamic,LA is mod. dilated,trace MR,TR,AI     Current Outpatient Medications  Medication Sig Dispense Refill  . albuterol (VENTOLIN HFA) 108 (90 Base) MCG/ACT inhaler Inhale 2 puffs into the lungs every 4 (four) hours as needed for wheezing or shortness of breath.     Marland Kitchen alum & mag hydroxide-simeth (MAALOX/MYLANTA) 200-200-20 MG/5ML suspension Take 30 mLs by mouth every 6 (six) hours as needed for indigestion or heartburn.    . Ascorbic Acid (VITAMIN C) 1000 MG tablet Take 1,000 mg by mouth daily.    Marland Kitchen aspirin EC 81 MG tablet Take 81 mg by mouth daily. Swallow whole.    . cholecalciferol (VITAMIN D3) 25 MCG (1000 UNIT) tablet Take 1,000 Units by mouth 2 (two) times daily.    . CRESTOR 20 MG tablet TAKE 1 TABLET(20 MG) BY MOUTH DAILY 90 tablet 3  . famotidine (PEPCID) 20 MG tablet Take 20 mg by mouth 2 (two)  times daily.     . furosemide (LASIX) 20 MG tablet Take 1 tablet (20 mg total) by mouth daily. 90 tablet 3  . loteprednol (LOTEMAX) 0.5 % ophthalmic suspension Place 1 drop into both eyes daily as needed (imflammation).     . metoprolol succinate (TOPROL-XL) 50 MG 24 hr tablet TAKE 1 TABLET BY MOUTH EVERY MORNING AND 1/2 TABLET EVERY EVENING 135 tablet 1  . Multiple Vitamin (MULTIVITAMIN) tablet Take 1 tablet by mouth daily.    . nitroGLYCERIN (NITROSTAT) 0.4 MG SL tablet Place 1 tablet (0.4 mg total) under the tongue every 5 (five) minutes as needed for chest pain. 25 tablet 3   No current facility-administered medications for this visit.  Allergies:   Codeine, Kenalog [triamcinolone acetonide], Relafen [nabumetone], Epinephrine, Penicillins, and Tramadol    ROS:  Please see the history of present illness.   Otherwise, review of systems are positive for none.   All other systems are reviewed and negative.    PHYSICAL EXAM: VS:  BP 122/70   Pulse 70   Ht 5\' 8"  (1.727 m)   Wt 268 lb 6.4 oz (121.7 kg)   SpO2 94%   BMI 40.81 kg/m  , BMI Body mass index is 40.81 kg/m. GENERAL:  Well appearing NECK:  No jugular venous distention, waveform within normal limits, carotid upstroke brisk and symmetric, no bruits, no thyromegaly LUNGS:  Clear to auscultation bilaterally CHEST:  Unremarkable HEART:  PMI not displaced or sustained,S1 and S2 within normal limits, no S3, no S4, no clicks, no rubs, 3/6 apical systolic murmur where he had at the aortic outflow tract murmurs ABD:  Flat, positive bowel sounds normal in frequency in pitch, no bruits, no rebound, no guarding, no midline pulsatile mass, no hepatomegaly, no splenomegaly EXT:  2 plus pulses throughout, no edema, no cyanosis no clubbing    EKG:  EKG is  ordered today. Sinus rhythm, rate 70, left axis deviation, no acute ST-T wave changes.  Recent Labs: 12/18/2020: ALT 23; BUN 15; Creatinine, Ser 0.92; Hemoglobin 15.9; Platelets 235;  Potassium 3.9; Sodium 138    Lipid Panel    Component Value Date/Time   CHOL 168 06/02/2019 1245   TRIG 212 (H) 06/02/2019 1245   HDL 39 (L) 06/02/2019 1245   CHOLHDL 4.3 06/02/2019 1245   CHOLHDL 5.1 03/17/2018 0034   VLDL 37 03/17/2018 0034   LDLCALC 87 06/02/2019 1245      Wt Readings from Last 3 Encounters:  02/10/21 268 lb 6.4 oz (121.7 kg)  12/18/20 260 lb (117.9 kg)  11/16/20 268 lb (121.6 kg)      Other studies Reviewed: Additional studies/ records that were reviewed today include: None Review of the above records demonstrates:  Please see elsewhere in the note.     ASSESSMENT AND PLAN:  ATRIAL FIB:   She has Victoria Delgado has a CHA2DS2 - VASc score 5.  She has had no recurrent atrial fibrillation.  She is at high risk for anticoagulation because of some GI bleeding.  No change in therapy.  She thinks she would be able to feel her fib.   CAD:  The patient has no new sypmtoms.    LVH- This was severe on the echo.  I will follow up as below.   AS - She understands endocarditis prophylaxis.  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  I think between this and her LVH she simply needs a higher dose of Lasix.  She comes back in a couple of weeks I will check a BMP.  She will increase her Lasix to 40 mg daily.  Dyslipidemia - She does not want to take higher dose Crestor.  I will follow up with a lipid profile.   Hypertension  Her blood pressure is controlled.  She tolerates occasional low BPs.  No change in therapy except as above.    Current medicines are reviewed at length with the patient today.  The patient does not have concerns regarding medicines.  The following changes have been made:  As above.   Labs/ tests ordered today include:   Orders Placed This Encounter  Procedures  . Basic metabolic panel  .  Lipid Profile  . EKG 12-Lead  . ECHOCARDIOGRAM COMPLETE     Disposition:   FU with me in April    Signed, Caeleb Batalla, MD  02/10/2021 11:24 AM    [facility]

## 2021-02-10 ENCOUNTER — Other Ambulatory Visit: Payer: Self-pay

## 2021-02-10 ENCOUNTER — Ambulatory Visit: Payer: Medicare Other | Admitting: Cardiology

## 2021-02-10 ENCOUNTER — Encounter: Payer: Self-pay | Admitting: Cardiology

## 2021-02-10 VITALS — BP 122/70 | HR 70 | Ht 68.0 in | Wt 268.4 lb

## 2021-02-10 DIAGNOSIS — I2510 Atherosclerotic heart disease of native coronary artery without angina pectoris: Secondary | ICD-10-CM

## 2021-02-10 DIAGNOSIS — I10 Essential (primary) hypertension: Secondary | ICD-10-CM

## 2021-02-10 DIAGNOSIS — Z953 Presence of xenogenic heart valve: Secondary | ICD-10-CM | POA: Diagnosis not present

## 2021-02-10 DIAGNOSIS — R0602 Shortness of breath: Secondary | ICD-10-CM

## 2021-02-10 DIAGNOSIS — I480 Paroxysmal atrial fibrillation: Secondary | ICD-10-CM | POA: Diagnosis not present

## 2021-02-10 DIAGNOSIS — I517 Cardiomegaly: Secondary | ICD-10-CM | POA: Diagnosis not present

## 2021-02-10 NOTE — Patient Instructions (Addendum)
Medication Instructions:  The current medical regimen is effective;  continue present plan and medications as directed. Please refer to the Current Medication list given to you today.  *If you need a refill on your cardiac medications before your next appointment, please call your pharmacy*  Lab Work: FASTING LIPID AND BMET 3-4 DAYS BEFORE FOLLOW UP If you have labs (blood work) drawn today and your tests are completely normal, you will receive your results only by:  MyChart Message (if you have MyChart) OR A paper copy in the mail.  If you have any lab test that is abnormal or we need to change your treatment, we will call you to review the results. You may go to any [facility] that is convenient for you however, we do have a lab in our office that is able to assist you. You DO NOT need an appointment for our lab. The lab is open 8:00am and closes at 4:00pm. Lunch 12:45 - 1:45pm.  Testing/Procedures: Echocardiogram - Your physician has requested that you have an echocardiogram. Echocardiography is a painless test that uses sound waves to create images of your heart. It provides your doctor with information about the size and shape of your heart and how well your heart's chambers and valves are working. This procedure takes approximately one hour. There are no restrictions for this procedure. This will be performed at our [facility] location - [address], [address].  Follow-Up: Your next appointment:  KEEP SCHEDULED APPOINTMENT  In Person with Rollene Rotunda, MD   At [facility], you and your health needs are our priority.  As part of our continuing mission to provide you with exceptional heart care, we have created designated Provider Care Teams.  These Care Teams include your primary Cardiologist (physician) and Advanced Practice Providers (APPs -  Physician Assistants and Nurse Practitioners) who all work together to provide you with the care you need, when you need it.

## 2021-03-07 ENCOUNTER — Other Ambulatory Visit: Payer: Self-pay

## 2021-03-07 ENCOUNTER — Ambulatory Visit (INDEPENDENT_AMBULATORY_CARE_PROVIDER_SITE_OTHER): Payer: Medicare Other | Admitting: Internal Medicine

## 2021-03-07 ENCOUNTER — Encounter: Payer: Self-pay | Admitting: Internal Medicine

## 2021-03-07 VITALS — BP 128/78 | HR 64 | Ht 68.0 in | Wt 269.6 lb

## 2021-03-07 DIAGNOSIS — E041 Nontoxic single thyroid nodule: Secondary | ICD-10-CM

## 2021-03-07 DIAGNOSIS — R7303 Prediabetes: Secondary | ICD-10-CM | POA: Diagnosis not present

## 2021-03-07 DIAGNOSIS — E278 Other specified disorders of adrenal gland: Secondary | ICD-10-CM | POA: Diagnosis not present

## 2021-03-07 LAB — POTASSIUM: Potassium: 4.1 mEq/L (ref 3.5–5.1)

## 2021-03-07 NOTE — Progress Notes (Addendum)
Patient ID: Victoria Delgado, female   DOB: 14-Nov-1941, 80 y.o.   MRN: [id number]   This visit occurred during the SARS-CoV-2 public health emergency.  Safety protocols were in place, including screening questions prior to the visit, additional usage of staff PPE, and extensive cleaning of exam room while observing appropriate contact time as indicated for disinfecting solutions.   HPI  Victoria Delgado is a 80 y.o.-year-old female, returning for follow-up for a R adrenal incidentaloma and prediabetes.  Last visit was 1 year ago.  Interim history: Since last visit, she had T[facility]R at [facility]. She still has some tingling in SOB. No pain, fatigue. She had a new CT abdomen and pelvis in 01/22 and this showed a stable right adrenal adenoma.  Adrenal incidentaloma Patient's adrenal mass was incidentally found during investigation for hemorrhagic cystitis.  She has a long standing history of back pain.  I reviewed patient's previous imaging records: Abdominal CT (06/22/2017): 2.5 cm R adrenal nodule, 15 HU, not present in 2004; likely adenoma. Abdominal MRI (07/24/2017): hypointense nodule with homogeneous T2 signal and signal dropout consistent with benign adenoma Adrenal CT  (09/08/2019): Stable adrenal mass Abdominal/pelvic CT (12/18/2020): Stable right adrenal adenoma measuring 2.5 cm  Reviewed previous renal investigation: Normal except for a slightly elevated normetanephrine in 11/2018 (nonspecific):  Component     Latest Ref Rng & Units 02/24/2020  Epinephrine     pg/mL 30  Norepinephrine     pg/mL 574  Dopamine     pg/mL 15  Total Catecholamines     pg/mL 619   Component     Latest Ref Rng & Units 02/24/2020 02/29/2020  Metanephrine, Pl     <=57 pg/mL <25   Normetanephrine, Pl     <=148 pg/mL 90   Total Metanephrines-Plasma     <=205 pg/mL 90   ALDOSTERONE     * ng/dL 7   Renin Activity     0.25 - 5.82 ng/mL/h 0.97   ALDO / PRA Ratio     0.9 - 28.9 Ratio 7.2   24 Hour urine volume  (VMAHVA)     mL  3,000  Cortisol (Ur), Free     4.0 - 50.0 mcg/24 h  14.4  Results received     0.50 - 2.15 g/24 h  1.38  Potassium     3.5 - 5.1 mEq/L 4.0    Component     Latest Ref Rng & Units 11/07/2018  24 Hour urine volume (VMAHVA)     mL 2,150  Cortisol (Ur), Free     4.0 - 50.0 mcg/24 h 12.0  Results received     0.50 - 2.15 g/24 h 1.51  Extra Urine Specimen        Creatinine, 24H Ur     0.50 - 2.15 g/24 h 1.51   Component     Latest Ref Rng & Units 11/05/2018  Epinephrine     pg/mL 27  Norepinephrine     pg/mL 842  Dopamine     pg/mL see note  Catecholamines, Total     pg/mL 869  Metanephrine, Pl     <=57 pg/mL <25  Normetanephrine, Pl     <=148 pg/mL 183 (H)  Total Metanephrines-Plasma     <=205 pg/mL 183  Potassium     3.5 - 5.1 mEq/L 4.3   Component     Latest Ref Rng & Units 11/05/2018  ALDOSTERONE      ng/dL 10  Renin Activity  0.25 - 5.82 ng/mL/h 1.24  ALDO / PRA Ratio     0.9 - 28.9 Ratio 8.1   Component     Latest Ref Rng & Units 11/05/2017 11/08/2017  Epinephrine     pg/mL see note   Norepinephrine     pg/mL 471   Dopamine     pg/mL see note   Catecholamines, Total     pg/mL 471   Aldosterone, Serum      ng/dL 4   Renin Activity     0.25 - 5.82 ng/mL/h 0.88   ALDO / PRA Ratio     0.9 - 28.9 Ratio 4.5   Metanephrine, Pl     <=57 pg/mL 27   Normetanephrine, Pl     <=148 pg/mL 134   Total Metanephrines-Plasma     <=205 pg/mL 161   24 Hour urine volume (VMAHVA)     mL  2,250  Cortisol (Ur), Free     4.0 - 50.0 mcg/24 h  14.4  Results received     0.50 - 2.15 g/24 h  1.46  Potassium     3.5 - 5.1 mEq/L 4.5    She has a history of HTN and aortic stenosis.  She is on metoprolol, Cardizem, furosemide.  Prediabetes: -Diagnosed after starting statins  Reviewed HbA1c levels: 12/12/2020: HbA1c 5.3% 09/26/2020: HbA1c 5.8% Lab Results  Component Value Date   HGBA1C 5.9 02/24/2020   HGBA1C 6.1 (A) 11/05/2018  2019: HbA1c:  6.3%.  She is checking sugars 0 to once a day: - am: 130-140 >> 135-150, 160 >> 100-110 >> 100-115 - 2h after b'fast: n/c >> 138 >> n/c - lunch: n/c >> 94-112 >> 90s-105 - 2h after lunch: n/c  - dinner: 102-107 >> 85, 91-101 >> 80s-90s - 2h after dinner: n/c >> 96-125 >> n/c - bedtime: n/c  No CKD: Lab Results  Component Value Date   BUN 15 12/18/2020   Lab Results  Component Value Date   CREATININE 0.92 12/18/2020   + HL: Lab Results  Component Value Date   CHOL 168 06/02/2019   HDL 39 (L) 06/02/2019   LDLCALC 87 06/02/2019   TRIG 212 (H) 06/02/2019   CHOLHDL 4.3 06/02/2019  She is on Crestor 20 mg daily (increased) and still has muscle aches and ankle pain.  She feels that this is increasing her blood sugars.  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  Latest eye exam 11/2018:? DR. She had a history of 3 episodes of retinal detachment.  She has a L thyroid nodule per recent chest CT (11/23/2019).   We checked a thyroid ultrasound at last visit and then a biopsy.  This returned inconclusive: Thyroid ultrasound (03/29/2020): Parenchymal Echotexture: Normal Isthmus: 0.2 cm Right lobe: 4.1 cm x 1.1 cm x 1.5 cm Left lobe: 3.6 cm x 2.5 cm x 2.3 cm _________________________________________________________  Nodule # 1: Location: Left; Mid Maximum size: 3.0 cm; Other 2 dimensions: 2.1 cm x 1.9 cm Composition: solid/almost completely solid (2) Echogenicity: hypoechoic (2) Echogenic foci: punctate echogenic foci (3) Nodule meets criteria for biopsy ________________________________________________________  No adenopathy  IMPRESSION: Left thyroid nodule (labeled 1, 3.0 cm) meets criteria for biopsy, as designated by the newly established ACR TI-RADS criteria, and referral for biopsy is recommended.  04/08/2020: FNA: inconclusive: Clinical History: None provided  Specimen Submitted: A. THYROID, LMP, FINE NEEDLE ASPIRATION:    FINAL  MICROSCOPIC DIAGNOSIS:  - Scant follicular epithelium present (Bethesda category I)  SPECIMEN ADEQUACY:  Satisfactory but limited for evaluation, scant cellularity   We discussed about the need to repeat the Bx in ~ 6 months.  Last TSH: 12/2020: TSH reportedly normal Lab Results  Component Value Date   TSH 3.139 06/09/2019   TSH 2.83 05/07/2016   TSH 2.553 04/20/2014   TSH 2.857 03/05/2013   + FH of thyroid ds. In sisters and mother. No FH of thyroid cancer. No h/o radiation tx to head or neck.  Pt denies: - feeling nodules in neck - hoarseness - dysphagia - choking - SOB with lying down  She had a steroid inj for bursitis ~2008, not since.  Steroids caused her A. fib in the past.  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  She had unstable angina and had a cardiac cath in 03/2018.  She was swimming before but the aquatic center closed due to the coronavirus pandemic.  ROS: Constitutional: no weight gain/no weight loss, no fatigue, no subjective hyperthermia, no subjective hypothermia Eyes: no blurry vision, no xerophthalmia ENT: no sore throat,  + see HPI Cardiovascular: no CP/+ SOB/no palpitations/no leg swelling Respiratory: no cough/+ occasional SOB/no wheezing Gastrointestinal: no N/no V/no D/no C/no acid reflux Musculoskeletal: no muscle aches/no joint aches Skin: no rashes, no hair loss Neurological: no tremors/no numbness/no tingling/no dizziness  I reviewed pt's medications, allergies, PMH, social hx, family hx, and changes were documented in the history of present illness. Otherwise, unchanged from my initial visit note.  Past Medical History:  Diagnosis Date  . Abdominal pain   . Arthritis    osteoarthritis-hips,knees, back, hands  . Asymmetric septal hypertrophy (HCC)   . Atrial fibrillation (HCC)   . CAD (coronary artery disease)    2 drug-eluting stents placed in the circumflex leading into a marginal.  May 2017.  [name].    catheterization on 03/19/2018, this showed patent stent in the proximal to mid left circumflex artery, 20% ostial left circumflex artery disease, 40% OM 3 disease, widely patent stent in the ostial D1, 20% ostial to proximal LAD disease, 20% mid LAD disease.  . Cataract    corrective surgery done  . Fatty liver   . Gallstones   . GERD (gastroesophageal reflux disease)   . GERD (gastroesophageal reflux disease)   . Heart palpitations   . Hepatitis    hepatitis A; past hx.,many yrs ago ? food source  . Hernia, hiatal   . Hyperlipidemia   . Hypertension   . Obesity   . Sleep apnea    CPAP   Past Surgical History:  Procedure Laterality Date  . APPENDECTOMY  2004  . CATARACT EXTRACTION W/ INTRAOCULAR LENS IMPLANT     both eyes  . CHOLECYSTECTOMY    . LEFT HEART CATH AND CORONARY ANGIOGRAPHY N/A 03/19/2018   Procedure: LEFT HEART CATH AND CORONARY ANGIOGRAPHY;  Surgeon: Kathleene Hazel, MD;  Location: [facility] INVASIVE CV LAB;  Service: Cardiovascular;  Laterality: N/A;  . NM MYOCAR PERF WALL MOTION  08/15/04   No ischemia  . RETINAL DETACHMENT SURGERY Bilateral    remains with slight hazy vision with lights  . RIGHT/LEFT HEART CATH AND CORONARY ANGIOGRAPHY N/A 07/11/2020   Procedure: RIGHT/LEFT HEART CATH AND CORONARY ANGIOGRAPHY;  Surgeon: Tonny Bollman, MD;  Location: [facility] INVASIVE CV LAB;  Service: Cardiovascular;  Laterality: N/A;  . TOTAL HIP ARTHROPLASTY Right 08/04/2014   Procedure: RIGHT TOTAL HIP ARTHROPLASTY ANTERIOR APPROACH;  Surgeon: Loanne Drilling, MD;  Location: [facility];  Service: Orthopedics;  Laterality: Right;  . TOTAL HIP ARTHROPLASTY Left 01/05/2015   Procedure: LEFT TOTAL HIP ARTHROPLASTY ANTERIOR APPROACH;  Surgeon: Loanne Drilling, MD;  Location: [facility] ORS;  Service: Orthopedics;  Laterality: Left;  . US ECHOCARDIOGRAPHY  06/25/2012   Moderate ASH,LV hyperdynamic,LA is mod. dilated,trace MR,TR,AI   Social History   Socioeconomic History  . Marital status: Married     Spouse name: Not on file  . Number of children: 2  . Years of education: Not on file  . Highest education level: Not on file  Occupational History    Employer: OTHER  Tobacco Use  . Smoking status: Former Games developer  . Smokeless tobacco: Never Used  . Tobacco comment: QUIT IN 1990  Vaping Use  . Vaping Use: Never used  Substance and Sexual Activity  . Alcohol use: No    Alcohol/week: 0.0 standard drinks  . Drug use: No  . Sexual activity: Yes  Other Topics Concern  . Not on file  Social History Narrative   Two grandchildren.            Social Determinants of Health   Financial Resource Strain: Not on file  Food Insecurity: Not on file  Transportation Needs: Not on file  Physical Activity: Not on file  Stress: Not on file  Social Connections: Not on file  Intimate Partner Violence: Not on file   Current Outpatient Medications on File Prior to Visit  Medication Sig Dispense Refill  . albuterol (VENTOLIN HFA) 108 (90 Base) MCG/ACT inhaler Inhale 2 puffs into the lungs every 4 (four) hours as needed for wheezing or shortness of breath.     Marland Kitchen alum & mag hydroxide-simeth (MAALOX/MYLANTA) 200-200-20 MG/5ML suspension Take 30 mLs by mouth every 6 (six) hours as needed for indigestion or heartburn.    . Ascorbic Acid (VITAMIN C) 1000 MG tablet Take 1,000 mg by mouth daily.    Marland Kitchen aspirin EC 81 MG tablet Take 81 mg by mouth daily. Swallow whole.    . cholecalciferol (VITAMIN D3) 25 MCG (1000 UNIT) tablet Take 1,000 Units by mouth 2 (two) times daily.    . CRESTOR 20 MG tablet TAKE 1 TABLET(20 MG) BY MOUTH DAILY 90 tablet 3  . famotidine (PEPCID) 20 MG tablet Take 20 mg by mouth 2 (two) times daily.     . furosemide (LASIX) 20 MG tablet Take 1 tablet (20 mg total) by mouth daily. 90 tablet 3  . loteprednol (LOTEMAX) 0.5 % ophthalmic suspension Place 1 drop into both eyes daily as needed (imflammation).     . metoprolol succinate (TOPROL-XL) 50 MG 24 hr tablet TAKE 1 TABLET BY MOUTH EVERY  MORNING AND 1/2 TABLET EVERY EVENING 135 tablet 1  . Multiple Vitamin (MULTIVITAMIN) tablet Take 1 tablet by mouth daily.    . nitroGLYCERIN (NITROSTAT) 0.4 MG SL tablet Place 1 tablet (0.4 mg total) under the tongue every 5 (five) minutes as needed for chest pain. 25 tablet 3   No current facility-administered medications on file prior to visit.   Allergies  Allergen Reactions  . Codeine Nausea Only  . Kenalog [Triamcinolone Acetonide] Other (See Comments)    Had A.fib after she received the injection."Steroid meds"  . Relafen [Nabumetone] Other (See Comments)    Edema   . Epinephrine Palpitations  . Penicillins Rash    40 years ago, injection site was red and swollen.  rash, facial/tongue/throat swelling, SOB or lightheadedness with hypotension: Yes Has patient had a PCN  reaction causing immediate  Has patient had a PCN reaction causing severe rash involving mucus membranes or skin necrosis: NO Has patient had a PCN reaction that required hospitalization. No  Has patient had a PCN reaction occurring within the last 10 years: No If all of the above answers are "NO", then may proceed with Cephalosporin use.    . Tramadol Other (See Comments)    Auditory halllucinations   Family History  Problem Relation Age of Onset  . Hypertension Mother   . CVA Mother   . Lung cancer Father 24       asbestos exposure  . Cancer Father   . Esophageal cancer Other        nephew  . Diabetes Other        nephew  . Colon cancer Neg Hx   . Pancreatic cancer Neg Hx   . Kidney disease Neg Hx   . Liver disease Neg Hx    PE: BP 128/78 (BP Location: Left Arm, Patient Position: Sitting, Cuff Size: Normal)   Pulse 64   Ht 5\' 8"  (1.727 m)   Wt 269 lb 9.6 oz (122.3 kg)   SpO2 96%   BMI 40.99 kg/m  Wt Readings from Last 3 Encounters:  03/07/21 269 lb 9.6 oz (122.3 kg)  02/10/21 268 lb 6.4 oz (121.7 kg)  12/18/20 260 lb (117.9 kg)   Constitutional: overweight, in NAD Eyes: PERRLA, EOMI, no  exophthalmos ENT: moist mucous membranes, no thyromegaly, no cervical lymphadenopathy Cardiovascular: RRR, No RG, +2/6 SEM Respiratory: CTA B Gastrointestinal: abdomen soft, NT, ND, BS+ Musculoskeletal: no deformities, strength intact in all 4 Skin: moist, warm, no rashes Neurological: no tremor with outstretched hands, DTR normal in all 4  ASSESSMENT: 1. R Adrenal incidentaloma  2.  Prediabetes  3. L Thyroid nodule  PLAN:  1. Patient with a right-sided adrenal nodule, discovered incidentally -We discussed that there are 3 possible scenarios: -A nonfunctioning adrenal nodule -A functioning adrenal adenoma- which can hypersecrete catecholamines/metanephrines, cortisol, or aldosterone -Adrenal cancer or metastases. -Reviewed previous investigation for the nodule:  -She refused dexamethasone suppression tests as she mentioned that she was very sensitive to exogenous steroids.  The 24-hour urine cortisol was performed to rule out Cushing syndrome (6% of adrenal incidentaloma) and this was normal  -We checked fractionated metanephrines and catecholamines to rule out pheochromocytoma (3% of adrenal incidentaloma's) and these were normal, but in the past she did have a slightly high normetanephrine.  I explained that this was a nonspecific finding.  Most recent level was normal.  -We checked plasma renin activity and aldosterone level to rule out primary hyperaldosteronism (0.6% of adrenal incidentalomas) and these were normal  -I explained to the best indicator that her nodule is not cancerous is lack of change in size and appearance over time.  The nodule did not appear to present on the CT of the abdomen from 2004, but, per my review of the images, it is possible that the right adrenal was not well visualized at the time.  She had imaging of her adrenals in 2018 and another dedicated adrenal CT abdomen on 09/08/2019, recently, which showed a stable adrenal mass.  No further imaging tests were  necessary to investigate her adrenal nodule.  However, she did have another CT scan of abdomen and pelvis on 12/18/2020 and this showed a stable right adrenal adenoma, measuring 2.5 cm. -We will repeat her hormonal investigation now, to complete 5 years  2. Prediabetes -Before last visit HbA1c  was 5.9%.  She was off Metformin and did not want to restart it.  At that time: Sugars were better, almost all at goal -I recommended limiting fatty foods and concentrated sweets.  I also suggested exercise.  Of note, she cannot perform weightbearing exercises due to joint pain. -We did not start Metformin yet and it does not appear that we have to start it now -At this visit, she mentions that 2 months ago she had another HbA1c checked and this was even better, at 5.3%  3.  L Thyroid nodule -Incidentally found on a CT chest before last visit -Not palpable on exam -She denies neck compression symptoms -No family history of thyroid cancer -No history of radiation therapy to head or neck -We checked a thyroid ultrasound on 03/29/2020 and this showed a 3 x 2.1 x 1.9 cm thyroid nodule.  We attempted to biopsy this nodule but the sample was inconclusive due to scant material.  I advised her that she will need another biopsy in 6 months, but she did not have this yet.  We will order this today.  We will check another thyroid ultrasound first. -She recently had a TSH checked by PCP and this was normal reportedly  Thyroid U/S (03/14/2021): Parenchymal Echotexture: Mildly heterogenous Isthmus: 5 mm Right lobe: 3.8 x 1.5 x 1.6 cm Left lobe: 4.6 x 2.7 x 3.0 cm _________________________________________________________  Nodule # 1: Location: Right; Inferior Maximum size: 1.2 cm; Other 2 dimensions: 0.8 x 1.2 cm Composition: solid/almost completely solid (2) Echogenicity: hypoechoic (2) ACR TI-RADS risk category: TR4 (4-6 points).  ACR TI-RADS recommendations: *Given size (>/= 1 - 1.4 cm) and appearance, a  follow-up ultrasound in 1 year should be considered based on TI-RADS criteria. 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 No significant interval change. Very similar appearance by ultrasound. Correlate with prior pathology.  Stable minor thyroid heterogeneity. No hypervascularity or regional adenopathy.  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 Correlate with prior pathology. 1.2 cm right inferior TR 4 nodule meets criteria follow-up in 1 year.  Electronically Signed   By: Judie Petit.  Shick M.D.   On: 03/14/2021 12:29  Will attempt rebiopsy of the dominant nodule.  Office Visit on 03/07/2021  Component Date Value Ref Range Status  . Metanephrine, Free 03/07/2021 <25  <=57 pg/mL Final   Comment: . This test was developed and its analytical performance characteristics have been determined by [facility] [place], [place]. It has not been cleared or approved by the U.S. Food and Drug Administration. This assay has been validated pursuant to the CLIA regulations and is used for clinical purposes. .   Sheryn Bison, Free 03/07/2021 132  <=148 pg/mL Final   Comment: . This test was developed and its analytical performance characteristics have been determined by [facility] [place], [place]. It has not been cleared or approved by the U.S. Food and Drug Administration. This assay has been validated pursuant to the CLIA regulations and is used for clinical purposes. .   . Total Metanephrines-Plasma 03/07/2021 132  <=205 pg/mL Final   Comment: . For additional information, please refer to http://education.questdiagnostics.com/faq/MetFractFree (This link is being provided for informational/educatio informational/educational purposes only.) . Elevations >4-fold upper  reference range: strongly suggestive of a pheochromocytoma(1). . Elevations >1- 4-fold upper reference range: significant but not diagnostic, may be due to medications  or stress. Suggest running 24 hr urine fractionated metanephrines and/or serum Chromagranin A for confirmation. . Reference: . (1)Algeciras-Schimnich A et al, Plasma Chromogranin A or Urine Fractionated Metanephrines Follow-Up Testing Improves the Diagnostic Accuracy of Plasma Fractionated Metanephrines for Pheochromocytoma. The Journal of Clinical Endocrinology # Metabolism 93(1), 91-95, 2008. . . . This test was developed and its analytical performance characteristics have been determined by [facility] [place], [place]. It has not been cleared or a                          pproved by the U.S. Food and Drug Administration. This assay has been validated pursuant to the CLIA regulations and is used for clinical purposes. .   . Epinephrine 03/07/2021 20  pg/mL Final   Comment: . This test was developed and its analytical performance characteristics have been determined by [facility]. It has not been cleared or approved by FDA. This assay has been validated pursuant to the CLIA regulations and is used for clinical purposes.   . Norepinephrine 03/07/2021 832  pg/mL Final   Comment: . This test was developed and its analytical performance characteristics have been determined by [facility]. It has not been cleared or approved by FDA. This assay has been validated pursuant to the CLIA regulations and is used for clinical purposes.   . Dopamine 03/07/2021 22* pg/mL Final   Comment: . This test was developed and its analytical performance characteristics have been determined by [facility]. It has not been cleared or approved by FDA. This assay has been  validated pursuant to the CLIA regulations and is used for clinical purposes.   . Total Catecholamines 03/07/2021 874  pg/mL Final   Comment: . Adult Reference Ranges for Catecholamines, Plasma . Epinephrine           Supine:  <50 pg/mL                       Upright: <95 pg/mL . Norepinephrine        Supine:  112-658 pg/mL                       Upright: [phone] pg/mL . Dopamine              Supine:  <10 pg/mL                       Upright: <20 pg/mL . Total (N+E+D)         Supine:  123-671 pg/mL                       Upright: [phone] pg/mL . Pediatric Reference Ranges for Catecholamines, Plasma . Due to stress, plasma catecholamine levels are generally unreliable in infants and small children. Urinary catecholamine assays are more reliable. Marland Kitchen Epinephrine    3-15 Years         Supine:  < or = 464 pg/mL                       Upright: No Reference                                Range Available . Norepinephrine  3-15 Years         Supine:  < or = 1251 pg/mL                       Upright: No Reference                                Range Available . Dopamine    3-15 Years         Supine:  <60 p                          g/mL                       Upright: No Reference                                Range Available . Pediatric data from [facility] [phone]. . This test was developed and its analytical performance characteristics have been determined by [facility]. It has not been cleared or approved by FDA. This assay has been validated pursuant to the CLIA regulations and is used for clinical purposes.   . Aldosterone 03/07/2021 7  * ng/dL Final   Comment: . * Unable to flag abnormal result(s), please refer     to reference range(s) below: . Adult Reference Ranges for Aldosterone, LC/MS/MS:     Upright  8:00 - 10:00 am    < or = 28 ng/dL     Upright  1:19 -  1:47 pm    < or = 21 ng/dL     Supine   8:29 -  56:21 am       3 - 16 ng/dL .   Marland Kitchen Renin Activity 03/07/2021 0.90  0.25 - 5.82 ng/mL/h Final  . ALDO / PRA Ratio 03/07/2021 7.8  0.9 - 28.9 Ratio Final   Comment: . This test was developed and its analytical performance characteristics have been determined by [facility] [place], [place]. It has not been cleared or approved by the U.S. Food and Drug Administration. This assay has been validated pursuant to the CLIA regulations and is used for clinical purposes. .   . Potassium 03/07/2021 4.1  3.5 - 5.1 mEq/L Final  . Cortisol,F,ug/L,U 03/07/2021 7  Undefined ug/L Final  . Cortisol (Ur), Free 03/07/2021 21  6 - 42 ug/24 hr Final   Normal blood work and 24-hour urine cortisol.  Thyroid U/S (03/14/2021): Parenchymal Echotexture: Mildly heterogenous Isthmus: 5 mm Right lobe: 3.8 x 1.5 x 1.6 cm Left lobe: 4.6 x 2.7 x 3.0 cm _________________________________________________________  Estimated total number of nodules >/= 1 cm: 2 _________________________________________________________  Nodule # 1: Location: Right; Inferior Maximum size: 1.2 cm; Other 2 dimensions: 0.8 x 1.2 cm Composition: solid/almost completely solid (2) Echogenicity: hypoechoic (2)  *Given size (>/= 1 - 1.4 cm) and appearance, a follow-up ultrasound in 1 year should be considered based on TI-RADS criteria. 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 No significant interval change. Very similar appearance by ultrasound. Correlate with prior pathology.  Stable minor thyroid heterogeneity. No hypervascularity or regional adenopathy.  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 Correlate with prior pathology.  1.2 cm right inferior TR 4 nodule meets criteria follow-up in 1 year.  The above is in keeping with the ACR  TI-RADS recommendations - J Am Coll Radiol 2017;14:587-595.  Electronically Signed   By: Judie Petit.  Shick M.D.   On: 03/14/2021 12:29  Will attempt biopsy of the left mid thyroid nodule.  Carlus Pavlov, MD PhD [facility] Endocrinology

## 2021-03-07 NOTE — Patient Instructions (Addendum)
Please stop at the lab.  Please collect another 24 h urine.  Please come back for a follow-up appointment in 1 year.

## 2021-03-14 ENCOUNTER — Other Ambulatory Visit: Payer: Medicare Other

## 2021-03-14 ENCOUNTER — Other Ambulatory Visit: Payer: Self-pay

## 2021-03-14 ENCOUNTER — Ambulatory Visit
Admission: RE | Admit: 2021-03-14 | Discharge: 2021-03-14 | Disposition: A | Discharge status: Home / Self Care | Payer: Medicare Other | Source: Ambulatory Visit | Attending: Internal Medicine | Admitting: Internal Medicine

## 2021-03-14 DIAGNOSIS — E278 Other specified disorders of adrenal gland: Secondary | ICD-10-CM

## 2021-03-14 DIAGNOSIS — E041 Nontoxic single thyroid nodule: Secondary | ICD-10-CM

## 2021-03-15 LAB — CREATININE, URINE, 24 HOUR
Creatinine, 24H Ur: 1053 mg/24 hr (ref 800–1800)
Creatinine, Urine: 35.1 mg/dL

## 2021-03-16 LAB — METANEPHRINES, PLASMA
Metanephrine, Free: <25 pg/mL (ref <=57)
Normetanephrine, Free: 132 pg/mL (ref <=148)
Total Metanephrines-Plasma: 132 pg/mL (ref <=205)

## 2021-03-16 LAB — CATECHOLAMINES, FRACTIONATED, PLASMA
Dopamine: 22 pg/mL — ABNORMAL HIGH
Epinephrine: 20 pg/mL
Norepinephrine: 832 pg/mL
Total Catecholamines: 874 pg/mL

## 2021-03-16 LAB — ALDOSTERONE + RENIN ACTIVITY W/ RATIO
ALDO / PRA Ratio: 7.8 Ratio (ref 0.9–28.9)
Aldosterone: 7 ng/dL
Renin Activity: 0.9 ng/mL/h (ref 0.25–5.82)

## 2021-03-20 ENCOUNTER — Encounter: Payer: Self-pay | Admitting: Internal Medicine

## 2021-03-20 LAB — CORTISOL, URINE, FREE
Cortisol (Ur), Free: 21 ug/24 hr (ref 6–42)
Cortisol,F,ug/L,U: 7 ug/L

## 2021-03-23 ENCOUNTER — Other Ambulatory Visit: Payer: Medicare Other

## 2021-03-28 ENCOUNTER — Ambulatory Visit: Payer: Medicare Other | Admitting: Cardiology

## 2021-03-29 ENCOUNTER — Other Ambulatory Visit (HOSPITAL_COMMUNITY): Payer: Medicare Other

## 2021-03-29 LAB — LIPID PANEL
Chol/HDL Ratio: 5 ratio — ABNORMAL HIGH (ref 0.0–4.4)
Cholesterol, Total: 189 mg/dL (ref 100–199)
HDL: 38 mg/dL — ABNORMAL LOW (ref >39)
LDL Chol Calc (NIH): 116 mg/dL — ABNORMAL HIGH (ref 0–99)
Triglycerides: 196 mg/dL — ABNORMAL HIGH (ref 0–149)
VLDL Cholesterol Cal: 35 mg/dL (ref 5–40)

## 2021-03-29 LAB — BASIC METABOLIC PANEL
BUN/Creatinine Ratio: 18 (ref 12–28)
BUN: 19 mg/dL (ref 8–27)
CO2: 26 mmol/L (ref 20–29)
Calcium: 10 mg/dL (ref 8.7–10.3)
Chloride: 104 mmol/L (ref 96–106)
Creatinine, Ser: 1.04 mg/dL — ABNORMAL HIGH (ref 0.57–1.00)
Glucose: 123 mg/dL — ABNORMAL HIGH (ref 65–99)
Potassium: 5.5 mmol/L — ABNORMAL HIGH (ref 3.5–5.2)
Sodium: 145 mmol/L — ABNORMAL HIGH (ref 134–144)
eGFR: 55 mL/min/{1.73_m2} — ABNORMAL LOW (ref >59)

## 2021-03-30 NOTE — Progress Notes (Signed)
Cardiology Office Note   Date:  03/31/2021   ID:  Victoria Delgado, DOB [birth year], MRN [id number]  PCP:  Charlane Ferretti, DO  Cardiologist:   Rollene Rotunda, MD   Chief Complaint  Patient presents with  . Shortness of Breath      History of Present Illness: Victoria Delgado is a 80 y.o. female who presents for follow up of CAD.  She has atrial fibrillation which she ultimately thinks was related to a steroid injection. This was in 2008. At that time she was found to have asymmetric septal hypertrophy. This has been followed conservatively.  Echocardiogram did demonstrate concentric LV hypertrophy which was moderate and moderate LAE.  In May of 2017 she was in the hospital with CAD.  She was admitted to [facility] on 5/24 after waking up with left arm pain.  She had positive troponin with  PCI to the mid LCX and 1st DX. She is on DAPT with aspirin/Effient. She had 2 drug-eluting stents placed in the circumflex leading into a marginal. This was apparently somewhat small circumflex. She also had a diagonal had a drug-eluting stent placed.  An echo in our office showed well preserved ejection fraction. There was some moderate aortic stenosis. There was no mention of hypertrophic obstruction although there is LVH.  She was in the hospital in April 2019 with chest pain.    She did have positive cardiac enzymes.   She underwent cardiac catheterization on 03/19/2018, this showed patent stent in the proximal to mid left circumflex artery, 20% ostial left circumflex artery disease, 40% OM 3 disease, widely patent stent in the ostial D1, 20% ostial to proximal LAD disease, 20% mid LAD disease.  Cardiac catheterization did confirm moderate aortic stenosis with mean gradient of 26.7 mmHg.  Medical therapy was recommended for her coronary artery disease.  She had a follow up echo and her AS was severe to critical.  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  (23 mm Edwards Sapien S3 Ultra)  She was SOB and was going to  have an echo.  However, she had eye surgery and was not able to have the echo. I increased her Lasix.   She said her breathing has been better.  I did increase her Lasix at the last visit.  Her potassium was checked in follow-up and it was fine.  She is not getting any new shortness of breath, PND or orthopnea.  She is not had any chest pressure, neck or arm discomfort.  He has had no weight gain or edema.  Past Medical History:  Diagnosis Date  . Abdominal pain   . Arthritis    osteoarthritis-hips,knees, back, hands  . Asymmetric septal hypertrophy (HCC)   . Atrial fibrillation (HCC)   . CAD (coronary artery disease)    2 drug-eluting stents placed in the circumflex leading into a marginal.  May 2017.  [name].   catheterization on 03/19/2018, this showed patent stent in the proximal to mid left circumflex artery, 20% ostial left circumflex artery disease, 40% OM 3 disease, widely patent stent in the ostial D1, 20% ostial to proximal LAD disease, 20% mid LAD disease.  . Cataract    corrective surgery done  . Fatty liver   . Gallstones   . GERD (gastroesophageal reflux disease)   . GERD (gastroesophageal reflux disease)   . Heart palpitations   . Hepatitis    hepatitis A; past hx.,many yrs ago ? food source  . Hernia, hiatal   .  Hyperlipidemia   . Hypertension   . Obesity   . Sleep apnea    CPAP    Past Surgical History:  Procedure Laterality Date  . APPENDECTOMY  2004  . CATARACT EXTRACTION W/ INTRAOCULAR LENS IMPLANT     both eyes  . CHOLECYSTECTOMY    . LEFT HEART CATH AND CORONARY ANGIOGRAPHY N/A 03/19/2018   Procedure: LEFT HEART CATH AND CORONARY ANGIOGRAPHY;  Surgeon: Kathleene Hazel, MD;  Location: [facility] INVASIVE CV LAB;  Service: Cardiovascular;  Laterality: N/A;  . NM MYOCAR PERF WALL MOTION  08/15/04   No ischemia  . RETINAL DETACHMENT SURGERY Bilateral    remains with slight hazy vision with lights  . RIGHT/LEFT HEART CATH AND CORONARY ANGIOGRAPHY N/A  07/11/2020   Procedure: RIGHT/LEFT HEART CATH AND CORONARY ANGIOGRAPHY;  Surgeon: Tonny Bollman, MD;  Location: [facility] INVASIVE CV LAB;  Service: Cardiovascular;  Laterality: N/A;  . TOTAL HIP ARTHROPLASTY Right 08/04/2014   Procedure: RIGHT TOTAL HIP ARTHROPLASTY ANTERIOR APPROACH;  Surgeon: Loanne Drilling, MD;  Location: [facility] ORS;  Service: Orthopedics;  Laterality: Right;  . TOTAL HIP ARTHROPLASTY Left 01/05/2015   Procedure: LEFT TOTAL HIP ARTHROPLASTY ANTERIOR APPROACH;  Surgeon: Loanne Drilling, MD;  Location: [facility] ORS;  Service: Orthopedics;  Laterality: Left;  . US ECHOCARDIOGRAPHY  06/25/2012   Moderate ASH,LV hyperdynamic,LA is mod. dilated,trace MR,TR,AI     Current Outpatient Medications  Medication Sig Dispense Refill  . albuterol (VENTOLIN HFA) 108 (90 Base) MCG/ACT inhaler Inhale 2 puffs into the lungs every 4 (four) hours as needed for wheezing or shortness of breath.     Marland Kitchen alum & mag hydroxide-simeth (MAALOX/MYLANTA) 200-200-20 MG/5ML suspension Take 30 mLs by mouth every 6 (six) hours as needed for indigestion or heartburn.    . Ascorbic Acid (VITAMIN C) 1000 MG tablet Take 1,000 mg by mouth daily.    Marland Kitchen aspirin EC 81 MG tablet Take 81 mg by mouth daily. Swallow whole.    . cholecalciferol (VITAMIN D3) 25 MCG (1000 UNIT) tablet Take 1,000 Units by mouth 2 (two) times daily.    . CRESTOR 20 MG tablet TAKE 1 TABLET(20 MG) BY MOUTH DAILY 90 tablet 3  . ezetimibe (ZETIA) 10 MG tablet Take 1 tablet (10 mg total) by mouth daily. 90 tablet 3  . famotidine (PEPCID) 20 MG tablet Take 20 mg by mouth 2 (two) times daily.     . furosemide (LASIX) 20 MG tablet Take 1 tablet (20 mg total) by mouth daily. 90 tablet 3  . loteprednol (LOTEMAX) 0.5 % ophthalmic suspension Place 1 drop into both eyes daily as needed (imflammation).     . metoprolol succinate (TOPROL-XL) 50 MG 24 hr tablet TAKE 1 TABLET BY MOUTH EVERY MORNING AND 1/2 TABLET EVERY EVENING 135 tablet 1  . Multiple Vitamin (MULTIVITAMIN)  tablet Take 1 tablet by mouth daily.    . nitroGLYCERIN (NITROSTAT) 0.4 MG SL tablet Place 1 tablet (0.4 mg total) under the tongue every 5 (five) minutes as needed for chest pain. 25 tablet 3   No current facility-administered medications for this visit.    Allergies:   Codeine, Kenalog [triamcinolone acetonide], Relafen [nabumetone], Epinephrine, Penicillins, and Tramadol    ROS:  Please see the history of present illness.   Otherwise, review of systems are positive for none.   All other systems are reviewed and negative.    PHYSICAL EXAM: VS:  BP 126/68 (BP Location: Left Arm, Patient Position: Sitting, Cuff Size: Large)   Pulse  65   Ht 5\' 8"  (1.727 m)   Wt 270 lb (122.5 kg)   BMI 41.05 kg/m  , BMI Body mass index is 41.05 kg/m. GENERAL:  Well appearing NECK:  No jugular venous distention, waveform within normal limits, carotid upstroke brisk and symmetric, no bruits, no thyromegaly LUNGS:  Clear to auscultation bilaterally CHEST:  Unremarkable HEART:  PMI not displaced or sustained,S1 and S2 within normal limits, no S3, no S4, no clicks, no rubs, 2 out of 6 apical systolic murmur radiating up the aortic outflow tract and early peaking, no diastolic murmurs ABD:  Flat, positive bowel sounds normal in frequency in pitch, no bruits, no rebound, no guarding, no midline pulsatile mass, no hepatomegaly, no splenomegaly EXT:  2 plus pulses throughout, no edema, no cyanosis no clubbing     EKG:  EKG is not ordered today.   Recent Labs: 12/18/2020: ALT 23; Hemoglobin 15.9; Platelets 235 03/28/2021: BUN 19; Creatinine, Ser 1.04; Potassium 5.5; Sodium 145    Lipid Panel    Component Value Date/Time   CHOL 189 03/28/2021 0918   TRIG 196 (H) 03/28/2021 0918   HDL 38 (L) 03/28/2021 0918   CHOLHDL 5.0 (H) 03/28/2021 0918   CHOLHDL 5.1 03/17/2018 0034   VLDL 37 03/17/2018 0034   LDLCALC 116 (H) 03/28/2021 0918      Wt Readings from Last 3 Encounters:  03/31/21 270 lb (122.5 kg)   03/07/21 269 lb 9.6 oz (122.3 kg)  02/10/21 268 lb 6.4 oz (121.7 kg)      Other studies Reviewed: Additional studies/ records that were reviewed today include: None Review of the above records demonstrates:  Please see elsewhere in the note.     ASSESSMENT AND PLAN:  ATRIAL FIB:   She has Ms. Victoria Delgado has a CHA2DS2 - VASc score 5.    She had no recurrent atrial fibrillation.  She is convinced she would feel it if it happened.  No change in therapy.   SOB:   This is improved.  No change in therapy.  CAD:  He is having no symptoms consistent with angina.  No change in therapy.   LVH- This was severe on echo.  This will be followed up when she has her repeat echo in the fall.   AS - She understands endocarditis prophylaxis.  Given her problems with her eye and not being able to lie flat right now to get the echo I will defer this.  She is going to get it before she goes back to [facility] in the fall.   Dyslipidemia - LDL was 116.   I will go ahead and start Zetia 10 mg daily.  She really did not want to take a higher dose of Crestor.   Hypertension  Her blood pressure is  controlled.  No change in therapy.    Current medicines are reviewed at length with the patient today.  The patient does not have concerns regarding medicines.  The following changes have been made: As above  Labs/ tests ordered today include: None  No orders of the defined types were placed in this encounter.    Disposition:   FU with me in February of next year.  Signed, March, MD  03/31/2021 12:51 PM    [facility]

## 2021-03-31 ENCOUNTER — Encounter: Payer: Self-pay | Admitting: Cardiology

## 2021-03-31 ENCOUNTER — Other Ambulatory Visit: Payer: Self-pay

## 2021-03-31 ENCOUNTER — Ambulatory Visit: Payer: Medicare Other | Admitting: Cardiology

## 2021-03-31 VITALS — BP 126/68 | HR 65 | Ht 68.0 in | Wt 270.0 lb

## 2021-03-31 DIAGNOSIS — I350 Nonrheumatic aortic (valve) stenosis: Secondary | ICD-10-CM

## 2021-03-31 DIAGNOSIS — I10 Essential (primary) hypertension: Secondary | ICD-10-CM

## 2021-03-31 DIAGNOSIS — I2510 Atherosclerotic heart disease of native coronary artery without angina pectoris: Secondary | ICD-10-CM | POA: Diagnosis not present

## 2021-03-31 DIAGNOSIS — I480 Paroxysmal atrial fibrillation: Secondary | ICD-10-CM

## 2021-03-31 DIAGNOSIS — I517 Cardiomegaly: Secondary | ICD-10-CM | POA: Diagnosis not present

## 2021-03-31 MED ORDER — EZETIMIBE 10 MG PO TABS: 10.0000 mg | ORAL_TABLET | Freq: Every day | ORAL | 3 refills | Status: DC

## 2021-03-31 NOTE — Patient Instructions (Signed)
Medication Instructions:  START- Zetia 10 mg by mouth daily  *If you need a refill on your cardiac medications before your next appointment, please call your pharmacy*   Lab Work: None Ordered   Testing/Procedures: None Ordered   Follow-Up: At [facility], you and your health needs are our priority.  As part of our continuing mission to provide you with exceptional heart care, we have created designated Provider Care Teams.  These Care Teams include your primary Cardiologist (physician) and Advanced Practice Providers (APPs -  Physician Assistants and Nurse Practitioners) who all work together to provide you with the care you need, when you need it.  We recommend signing up for the patient portal called "MyChart".  Sign up information is provided on this After Visit Summary.  MyChart is used to connect with patients for Virtual Visits (Telemedicine).  Patients are able to view lab/test results, encounter notes, upcoming appointments, etc.  Non-urgent messages can be sent to your provider as well.   To learn more about what you can do with MyChart, go to [email or web address].    Your next appointment:   1 year(s)  The format for your next appointment:   In Person  Provider:   You may see Rollene Rotunda, MD or one of the following Advanced Practice Providers on your designated Care Team:    Theodore Demark, PA-C  Joni Reining, DNP, ANP

## 2021-05-10 ENCOUNTER — Other Ambulatory Visit (HOSPITAL_COMMUNITY): Payer: Medicare Other

## 2021-05-30 ENCOUNTER — Other Ambulatory Visit: Payer: Self-pay | Admitting: Cardiology

## 2021-06-22 ENCOUNTER — Telehealth: Payer: Self-pay | Admitting: Cardiology

## 2021-06-22 DIAGNOSIS — R0602 Shortness of breath: Secondary | ICD-10-CM

## 2021-06-22 MED ORDER — FUROSEMIDE 40 MG PO TABS: 40.0000 mg | ORAL_TABLET | Freq: Every day | ORAL | 3 refills | Status: DC

## 2021-06-22 NOTE — Telephone Encounter (Signed)
Pt c/o medication issue:  1. Name of Medication: furosemide (LASIX) 20 MG tablet  2. How are you currently taking this medication (dosage and times per day)?  As directed  3. Are you having a reaction (difficulty breathing--STAT)? No  4. What is your medication issue? Pt would like for this medicine to be changed back to 40 mg then pt needs a refill, pt is completely out of this medicine

## 2021-06-22 NOTE — Telephone Encounter (Signed)
Spoke with pt, aware of the recommendations New script sent to the pharmacy  Lab orders mailed to the pt   

## 2021-06-29 ENCOUNTER — Other Ambulatory Visit (HOSPITAL_COMMUNITY)
Admission: RE | Admit: 2021-06-29 | Discharge: 2021-06-29 | Disposition: A | Discharge status: Home / Self Care | Payer: Medicare Other | Source: Ambulatory Visit | Attending: Internal Medicine | Admitting: Internal Medicine

## 2021-06-29 ENCOUNTER — Ambulatory Visit
Admission: RE | Admit: 2021-06-29 | Discharge: 2021-06-29 | Disposition: A | Discharge status: Home / Self Care | Payer: Medicare Other | Source: Ambulatory Visit | Attending: Internal Medicine | Admitting: Internal Medicine

## 2021-06-29 DIAGNOSIS — E041 Nontoxic single thyroid nodule: Secondary | ICD-10-CM | POA: Diagnosis present

## 2021-06-30 ENCOUNTER — Other Ambulatory Visit: Payer: Self-pay | Admitting: Cardiology

## 2021-06-30 LAB — CYTOLOGY - NON PAP

## 2021-06-30 NOTE — Telephone Encounter (Signed)
Refill given 06/22/2021

## 2021-07-04 LAB — BASIC METABOLIC PANEL
BUN/Creatinine Ratio: 17 (ref 12–28)
BUN: 15 mg/dL (ref 8–27)
CO2: 23 mmol/L (ref 20–29)
Calcium: 9.8 mg/dL (ref 8.7–10.3)
Chloride: 103 mmol/L (ref 96–106)
Creatinine, Ser: 0.86 mg/dL (ref 0.57–1.00)
Glucose: 120 mg/dL — ABNORMAL HIGH (ref 65–99)
Potassium: 4.1 mmol/L (ref 3.5–5.2)
Sodium: 143 mmol/L (ref 134–144)
eGFR: 70 mL/min/{1.73_m2} (ref >59)

## 2021-08-07 NOTE — Progress Notes (Signed)
Cardiology Clinic Note   Patient Name: Victoria Delgado Boileau Date of Encounter: 08/08/2021  Primary Care Provider:  Charlane FerrettiSkakle, [place], DO Primary Cardiologist:  Rollene RotundaJames Hochrein, MD  Patient Profile    Victoria Delgado Jaquess follow-up for shortness of breath.  Past Medical History    Past Medical History:  Diagnosis Date   Abdominal pain    Arthritis    osteoarthritis-hips,knees, back, hands   Asymmetric septal hypertrophy (HCC)    Atrial fibrillation (HCC)    CAD (coronary artery disease)    2 drug-eluting stents placed in the circumflex leading into a marginal.  [place]  [facility]   catheterization on 03/19/2018, this showed patent stent in the proximal to mid left circumflex artery, 20% ostial left circumflex artery disease, 40% OM 3 disease, widely patent stent in the ostial D1, 20% ostial to proximal LAD disease, 20% mid LAD disease.   Cataract    corrective surgery done   Fatty liver    Gallstones    GERD (gastroesophageal reflux disease)    GERD (gastroesophageal reflux disease)    Heart palpitations    Hepatitis    hepatitis A; past hx.,many yrs ago ? food source   Hernia, hiatal    Hyperlipidemia    Hypertension    Obesity    Sleep apnea    CPAP   Past Surgical History:  Procedure Laterality Date   APPENDECTOMY  2004   CATARACT EXTRACTION W/ INTRAOCULAR LENS IMPLANT     both eyes   CHOLECYSTECTOMY     LEFT HEART CATH AND CORONARY ANGIOGRAPHY N/A 03/19/2018   Procedure: LEFT HEART CATH AND CORONARY ANGIOGRAPHY;  Surgeon: Kathleene HazelMcAlhany, Christopher D, MD;  Location: [facility];  Service: Cardiovascular;  Laterality: N/A;   NM MYOCAR PERF WALL MOTION  08/15/04   No ischemia   RETINAL DETACHMENT SURGERY Bilateral    remains with slight hazy vision with lights   RIGHT/LEFT HEART CATH AND CORONARY ANGIOGRAPHY N/A 07/11/2020   Procedure: RIGHT/LEFT HEART CATH AND CORONARY ANGIOGRAPHY;  Surgeon: Tonny Bollmanooper, Michael, MD;  [facility];  Service: Cardiovascular;   Laterality: N/A;   TOTAL HIP ARTHROPLASTY Right 08/04/2014   Procedure: RIGHT TOTAL HIP ARTHROPLASTY ANTERIOR APPROACH;  Surgeon: Loanne DrillingFrank Aluisio V, MD;  Location: [facility];  Service: Orthopedics;  Laterality: Right;   TOTAL HIP ARTHROPLASTY Left 01/05/2015   Procedure: LEFT TOTAL HIP ARTHROPLASTY ANTERIOR APPROACH;  Surgeon: Loanne DrillingFrank Aluisio V, MD;  Location: [facility];  Service: Orthopedics;  Laterality: Left;   US ECHOCARDIOGRAPHY  06/25/2012   Moderate ASH,LV hyperdynamic,LA is mod. dilated,trace MR,TR,AI    Allergies  Allergies  Allergen Reactions   Codeine Nausea Only   Kenalog [Triamcinolone Acetonide] Other (See Comments)    Had A.fib after she received the injection."Steroid meds"   Relafen [Nabumetone] Other (See Comments)    Edema    Epinephrine Palpitations   Penicillins Rash    40 years ago, injection site was red and swollen.  rash, facial/tongue/throat swelling, SOB or lightheadedness with hypotension: Yes Has patient had a PCN reaction causing immediate  Has patient had a PCN reaction causing severe rash involving mucus membranes or skin necrosis: NO Has patient had a PCN reaction that required hospitalization. No  Has patient had a PCN reaction occurring within the last 10 years: No If all of the above answers are "NO", then may proceed with Cephalosporin use.     Tramadol Other (See Comments)    Auditory halllucinations    History  of Present Illness    Victoria Delgado has a PMH of paroxysmal atrial fibrillation, coronary artery disease, left ventricular hypertrophy, nonrheumatic aortic valve disease, hypertension, hypertrophic obstructive cardiomyopathy, essential hypertension, and unstable angina.  Her echocardiogram showed concentric LV hypertrophy which was moderate and moderate LAE.  5/17 she was admitted to the hospital with coronary artery disease.  She reported left arm pain.  Her troponins were positive and she underwent PCI to her mid LCx and first diagonal.  She was  started on dual antiplatelet therapy aspirin/Effient.  She received 2 drug-eluting stents to her circumflex leading into her marginal.  She also had a DES placed to her diagonal.  Her follow-up echocardiogram showed preserved LVEF.  She was admitted to the hospital 4/19 with chest discomfort.  She again had positive cardiac enzymes and underwent cardiac catheterization 03/19/2018.  She was noted to have patent stent in her proximal-mid left circumflex, 20% ostial left circumflex artery disease, 40% OM, widely patent stent in her ostial D1, 20% ostial-proximal LAD disease, 20% mid LAD disease.  Her cardiac catheterization did confirm moderate aortic stenosis with mean gradient of 26.7 mmHg.  Medical management was recommended.  A follow-up echocardiogram showed severe AS.  She underwent TAVR at [facility]  She received a 23 mm Edwards Sapien S3 ultra.  She was seen by Dr. Antoine Poche on 03/31/2021.  She was reporting shortness of breath and an echocardiogram was planned.  However, she had eye surgery and was not able to present for her echocardiogram.  Her furosemide was increased.  Her breathing improved.  Her potassium remained stable.  She denied new shortness of breath, PND, and orthopnea.  She denied chest pressure, arm, and neck discomfort.  She denied weight gain and lower extremity swelling.  She presents the clinic today for follow-up evaluation states she feels fairly well.  She has noticed improvements with her breathing since her TAVR.  She is compliant with her CPAP device.  She does note that she has alerts that indicate her machine is not transmitting the way that it should.  She will follow-up with the [facility] on January 1 for a repeat echocardiogram to assess the function of her aortic valve.  She has received inhalers from her PCP to help manage her emphysema.  She does not take any inhalers regularly although due to concern that the medication will affect her heart.  I have asked her to continue to follow  a low-sodium diet, increase her physical activity as tolerated, start taking her long-acting inhaler regularly, and follow-up with Dr. Antoine Poche in 6 months.  Today she denies chest pain, shortness of breath, lower extremity edema, fatigue, palpitations, melena, hematuria, hemoptysis, diaphoresis, weakness, presyncope, syncope, orthopnea, and PND.   Home Medications    Prior to Admission medications   Medication Sig Start Date End Date Taking? Authorizing Provider  albuterol (VENTOLIN HFA) 108 (90 Base) MCG/ACT inhaler Inhale 2 puffs into the lungs every 4 (four) hours as needed for wheezing or shortness of breath.  12/21/19   [provider]  alum & mag hydroxide-simeth (MAALOX/MYLANTA) 200-200-20 MG/5ML suspension Take 30 mLs by mouth every 6 (six) hours as needed for indigestion or heartburn.    [provider]  Ascorbic Acid (VITAMIN C) 1000 MG tablet Take 1,000 mg by mouth daily.    [provider]  aspirin EC 81 MG tablet Take 81 mg by mouth daily. Swallow whole.    [provider]  cholecalciferol (VITAMIN D3) 25 MCG (1000 UNIT)  tablet Take 1,000 Units by mouth 2 (two) times daily.    [provider]  CRESTOR 20 MG tablet TAKE 1 TABLET(20 MG) BY MOUTH DAILY 12/05/20   Rollene Rotunda, MD  ezetimibe (ZETIA) 10 MG tablet Take 1 tablet (10 mg total) by mouth daily. 03/31/21 06/29/21  Rollene Rotunda, MD  famotidine (PEPCID) 20 MG tablet Take 20 mg by mouth 2 (two) times daily.     [provider]  furosemide (LASIX) 40 MG tablet Take 1 tablet (40 mg total) by mouth daily. 06/22/21   Rollene Rotunda, MD  loteprednol (LOTEMAX) 0.5 % ophthalmic suspension Place 1 drop into both eyes daily as needed (imflammation).     [provider]  metoprolol succinate (TOPROL XL) 50 MG 24 hr tablet TAKE 1 TAKE BY MOUTH EVERY MORNING AND 1/2 TABLET EVERY EVENING 06/30/21   Rollene Rotunda, MD  Multiple Vitamin (MULTIVITAMIN) tablet Take 1 tablet by mouth  daily.    [provider]  nitroGLYCERIN (NITROSTAT) 0.4 MG SL tablet Place 1 tablet (0.4 mg total) under the tongue every 5 (five) minutes as needed for chest pain. 05/07/16   Rosalio Macadamia, NP    Family History    Family History  Problem Relation Age of Onset   Hypertension Mother    CVA Mother    Lung cancer Father 50       asbestos exposure   Cancer Father    Esophageal cancer Other        nephew   Diabetes Other        nephew   Colon cancer Neg Hx    Pancreatic cancer Neg Hx    Kidney disease Neg Hx    Liver disease Neg Hx    She indicated that her mother is alive. She indicated that her father is deceased. She indicated that her sister is alive. She indicated that her maternal grandmother is deceased. She indicated that her maternal grandfather is deceased. She indicated that her paternal grandmother is deceased. She indicated that her paternal grandfather is deceased. She indicated that the status of her neg hx is unknown.  Social History    Social History   Socioeconomic History   Marital status: Married    Spouse name: Not on file   Number of children: 2   Years of education: Not on file   Highest education level: Not on file  Occupational History    Employer: OTHER  Tobacco Use   Smoking status: Former   Smokeless tobacco: Never   Tobacco comments:    QUIT IN 1990  Vaping Use   Vaping Use: Never used  Substance and Sexual Activity   Alcohol use: No    Alcohol/week: 0.0 standard drinks   Drug use: No   Sexual activity: Yes  Other Topics Concern   Not on file  Social History Narrative   Two grandchildren.            Social Determinants of Health   Financial Resource Strain: Not on file  Food Insecurity: Not on file  Transportation Needs: Not on file  Physical Activity: Not on file  Stress: Not on file  Social Connections: Not on file  Intimate Partner Violence: Not on file     Review of Systems    General:  No chills, fever, night  sweats or weight changes.  Cardiovascular:  No chest pain, dyspnea on exertion, edema, orthopnea, palpitations, paroxysmal nocturnal dyspnea. Dermatological: No rash, lesions/masses Respiratory: No cough, dyspnea Urologic: No  hematuria, dysuria Abdominal:   No nausea, vomiting, diarrhea, bright red blood per rectum, melena, or hematemesis Neurologic:  No visual changes, wkns, changes in mental status. All other systems reviewed and are otherwise negative except as noted above.  Physical Exam    VS:  BP 122/68   Pulse 65   Ht 5\' 8"  (1.727 m)   Wt 280 lb 6.4 oz (127.2 kg)   SpO2 94%   BMI 42.63 kg/m  , BMI Body mass index is 42.63 kg/m. GEN: Well nourished, well developed, in no acute distress. HEENT: normal. Neck: Supple, no JVD, carotid bruits, or masses. Cardiac: RRR, no murmurs, rubs, or gallops. No clubbing, cyanosis, edema.  Radials/DP/PT 2+ and equal bilaterally.  Respiratory:  Respirations regular and unlabored, clear to auscultation bilaterally. GI: Soft, nontender, nondistended, BS + x 4. MS: no deformity or atrophy. Skin: warm and dry, no rash. Neuro:  Strength and sensation are intact. Psych: Normal affect.  Accessory Clinical Findings    Recent Labs: 12/18/2020: ALT 23; Hemoglobin 15.9; Platelets 235 07/03/2021: BUN 15; Creatinine, Ser 0.86; Potassium 4.1; Sodium 143   Recent Lipid Panel    Component Value Date/Time   CHOL 189 03/28/2021 0918   TRIG 196 (H) 03/28/2021 0918   HDL 38 (L) 03/28/2021 0918   CHOLHDL 5.0 (H) 03/28/2021 0918   CHOLHDL 5.1 03/17/2018 0034   VLDL 37 03/17/2018 0034   LDLCALC 116 (H) 03/28/2021 0918    ECG personally reviewed by me today-none today.  Echocardiogram 05/16/2020 IMPRESSIONS     1. Left ventricular ejection fraction, by estimation, is 60 to 65%. The  left ventricle has normal function. The left ventricle has no regional  wall motion abnormalities. There is severe concentric left ventricular  hypertrophy. Left  ventricular diastolic   parameters are consistent with Grade II diastolic dysfunction  (pseudonormalization). Elevated left ventricular end-diastolic pressure.   2. Right ventricular systolic function is normal. The right ventricular  size is normal. There is normal pulmonary artery systolic pressure. The  estimated right ventricular systolic pressure is 22.9 mmHg.   3. Left atrial size was moderately dilated.   4. The mitral valve is degenerative. Mild mitral valve regurgitation.  Mild mitral stenosis. The mean mitral valve gradient is 3.5 mmHg.   5. The aortic valve is normal in structure. Aortic valve regurgitation is  trivial. Severe aortic valve stenosis. Aortic regurgitation PHT measures  551 msec. Aortic valve area, by VTI measures 0.98 cm. Aortic valve mean  gradient measures 67.0 mmHg.  Aortic valve Vmax measures 4.86 m/Delgado.   6. The inferior vena cava is normal in size with greater than 50%  respiratory variability, suggesting right atrial pressure of 3 mmHg.   7. Compared to echo 05/2019, the mean MV gradient is unchanged at  3.5-52mmHg c/w mild mitral stenosis. The Peak/Mean transaortic gradients  have increased from 67/39>>94/4mmHg. DI has decreased from 0.4>>0.31 and  AVA has decreased from 1.14cm2>>1.12cm2.  Peak velocity has increased from 4.40m/Delgado>>4.55m/Delgado.  Assessment & Plan   1.  Atrial fibrillation-heart rate today 65.  No recent episodes of increased/accelerated heart rate or irregular heartbeats.  Denies bleeding issues. Previously on Eliquis and had GI bleeding  Continue aspirin  Heart healthy low-sodium diet-salty 6 given Increase physical activity as tolerated  Shortness of breath-no increased DOE or activity intolerance.  Compliant with her CPAP device.  Reports that it is not doing remote transitions.  I will reach out to our sleep apnea/sleep medicine team for support and facilitation. Continue  furosemide Heart healthy low-sodium diet-salty 6 given Increase  physical activity as tolerated  Coronary artery disease-denies chest pain today.  No recent episodes of arm neck back or chest discomfort. Continue aspirin, ezetimibe, metoprolol, nitroglycerin, rosuvastatin Heart healthy low-sodium diet-salty 6 given Increase physical activity as tolerated  Left ventricular hypertrophy-reports well-managed blood pressure.  Denies activity intolerance.  Previously noted to be severe.  We will have repeat echocardiogram at [facility] in December. Continue furosemide Heart healthy low-sodium diet-salty 6 given Increase physical activity as tolerated Repeat echocardiogram  Status post TAVR-previously with severe aortic stenosis. Received a 23 mm Edwards Sapien S3 ultra.09/27/20 Follows with [facility].  Hyperlipidemia-03/28/2021: Cholesterol, Total 189; HDL 38; LDL Chol Calc (NIH) 116; Triglycerides 196 Continue rosuvastatin Heart healthy low-sodium high-fiber diet Increase physical activity as tolerated  Essential hypertension-BP today 122/68.  Well-controlled at home. Continue metoprolol, furosemide Heart healthy low-sodium diet-salty 6 given Increase physical activity as tolerated Maintain blood pressure log  Disposition: Follow-up with Dr. Antoine Poche in 6 months.  Thomasene Ripple. Reginia Battie NP-C    08/08/2021, 12:19 PM [facility] [address] Office [phone] Fax [phone]  Notice: This dictation was prepared with Dragon dictation along with smaller phrase technology. Any transcriptional errors that result from this process are unintentional and may not be corrected upon review.  I spent 15 minutes examining this patient, reviewing medications, and using patient centered shared decision making involving her cardiac care.  Prior to her visit I spent greater than 20 minutes reviewing her past medical history,  medications, and prior cardiac tests.

## 2021-08-08 ENCOUNTER — Encounter: Payer: Self-pay | Admitting: General Practice

## 2021-08-08 ENCOUNTER — Ambulatory Visit: Payer: Medicare Other | Admitting: General Practice

## 2021-08-08 ENCOUNTER — Telehealth: Payer: Self-pay

## 2021-08-08 ENCOUNTER — Other Ambulatory Visit: Payer: Self-pay

## 2021-08-08 VITALS — BP 122/68 | HR 65 | Ht 68.0 in | Wt 280.4 lb

## 2021-08-08 DIAGNOSIS — I480 Paroxysmal atrial fibrillation: Secondary | ICD-10-CM | POA: Diagnosis not present

## 2021-08-08 DIAGNOSIS — E785 Hyperlipidemia, unspecified: Secondary | ICD-10-CM

## 2021-08-08 DIAGNOSIS — R0602 Shortness of breath: Secondary | ICD-10-CM

## 2021-08-08 DIAGNOSIS — I10 Essential (primary) hypertension: Secondary | ICD-10-CM

## 2021-08-08 DIAGNOSIS — I517 Cardiomegaly: Secondary | ICD-10-CM

## 2021-08-08 DIAGNOSIS — Z953 Presence of xenogenic heart valve: Secondary | ICD-10-CM

## 2021-08-08 DIAGNOSIS — I2510 Atherosclerotic heart disease of native coronary artery without angina pectoris: Secondary | ICD-10-CM | POA: Diagnosis not present

## 2021-08-08 NOTE — Patient Instructions (Addendum)
Medication Instructions:  The current medical regimen is effective;  continue present plan and medications as directed. Please refer to the Current Medication list given to you today.   *If you need a refill on your cardiac medications before your next appointment, please call your pharmacy*  Special Instructions PLEASE READ AND FOLLOW HEART HEALTHY DIET-ATTACHED  PLEASE INCREASE PHYSICAL ACTIVITY AS TOLERATED,   WE WILL CALL YOU ABOUT THE STATUS/WHAT WE NEED TO DO ABOUT CPAP MACHINE  Follow-Up: Your next appointment:  6 month(s) In Person with Rollene Rotunda, MD   At [facility], you and your health needs are our priority.  As part of our continuing mission to provide you with exceptional heart care, we have created designated Provider Care Teams.  These Care Teams include your primary Cardiologist (physician) and Advanced Practice Providers (APPs -  Physician Assistants and Nurse Practitioners) who all work together to provide you with the care you need, when you need it.    Heart-Healthy Eating Plan Heart-healthy meal planning includes: Eating less unhealthy fats. Eating more healthy fats. Making other changes in your diet. Talk with your doctor or a diet specialist (dietitian) to create an eating plan that is right for you. What is my plan? What are tips for following this plan? Cooking Avoid frying your food. Try to bake, boil, grill, or broil it instead. You can also reduce fat by: Removing the skin from poultry. Removing all visible fats from meats. Steaming vegetables in water or broth. Meal planning  At meals, divide your plate into four equal parts: Fill one-half of your plate with vegetables and green salads. Fill one-fourth of your plate with whole grains. Fill one-fourth of your plate with lean protein foods. Eat 4-5 servings of vegetables per day. A serving of vegetables is: 1 cup of raw or cooked vegetables. 2 cups of raw leafy greens. Eat 4-5 servings of  fruit per day. A serving of fruit is: 1 medium whole fruit.  cup of dried fruit.  cup of fresh, frozen, or canned fruit.  cup of 100% fruit juice. Eat more foods that have soluble fiber. These are apples, broccoli, carrots, beans, peas, and barley. Try to get 20-30 g of fiber per day. Eat 4-5 servings of nuts, legumes, and seeds per week: 1 serving of dried beans or legumes equals  cup after being cooked. 1 serving of nuts is  cup. 1 serving of seeds equals 1 tablespoon. General information Eat more home-cooked food. Eat less restaurant, buffet, and fast food. Limit or avoid alcohol. Limit foods that are high in starch and sugar. Avoid fried foods. Lose weight if you are overweight. Keep track of how much salt (sodium) you eat. This is important if you have high blood pressure. Ask your doctor to tell you more about this. Try to add vegetarian meals each week. Fats Choose healthy fats. These include olive oil and canola oil, flaxseeds, walnuts, almonds, and seeds. Eat more omega-3 fats. These include salmon, mackerel, sardines, tuna, flaxseed oil, and ground flaxseeds. Try to eat fish at least 2 times each week. Check food labels. Avoid foods with trans fats or high amounts of saturated fat. Limit saturated fats. These are often found in animal products, such as meats, butter, and cream. These are also found in plant foods, such as palm oil, palm kernel oil, and coconut oil. Avoid foods with partially hydrogenated oils in them. These have trans fats. Examples are stick margarine, some tub margarines, cookies, crackers, and other baked goods.  What foods can I eat? Fruits All fresh, canned (in natural juice), or frozen fruits. Vegetables Fresh or frozen vegetables (raw, steamed, roasted, or grilled). Green salads. Grains Most grains. Choose whole wheat and whole grains most of the time. Rice and pasta, including brown rice and pastas made with whole wheat. Meats and other  proteins Lean, well-trimmed beef, veal, pork, and lamb. Chicken and [place] without skin. All fish and shellfish. Wild duck, rabbit, pheasant, and venison. Egg whites or low-cholesterol egg substitutes. Dried beans, peas, lentils, and tofu. Seeds and most nuts. Dairy Low-fat or nonfat cheeses, including ricotta and mozzarella. Skim or 1% milk that is liquid, powdered, or evaporated. Buttermilk that is made with low-fat milk. Nonfat or low-fat yogurt. Fats and oils Non-hydrogenated (trans-free) margarines. Vegetable oils, including soybean, sesame, sunflower, olive, peanut, safflower, corn, canola, and cottonseed. Salad dressings or mayonnaise made with a vegetable oil. Beverages Mineral water. Coffee and tea. Diet carbonated beverages. Sweets and desserts Sherbet, gelatin, and fruit ice. Small amounts of dark chocolate. Limit all sweets and desserts. Seasonings and condiments All seasonings and condiments. The items listed above may not be a complete list of foods and drinks you can eat. Contact a dietitian for more options. What foods should I avoid? Fruits Canned fruit in heavy syrup. Fruit in cream or butter sauce. Fried fruit. Limit coconut. Vegetables Vegetables cooked in cheese, cream, or butter sauce. Fried vegetables. Grains Breads that are made with saturated or trans fats, oils, or whole milk. Croissants. Sweet rolls. Donuts. High-fat crackers, such as cheese crackers. Meats and other proteins Fatty meats, such as hot dogs, ribs, sausage, bacon, rib-eye roast or steak. High-fat deli meats, such as salami and bologna. Caviar. Domestic duck and goose. Organ meats, such as liver. Dairy Cream, sour cream, cream cheese, and creamed cottage cheese. Whole-milk cheeses. Whole or 2% milk that is liquid, evaporated, or condensed. Whole buttermilk. Cream sauce or high-fat cheese sauce. Yogurt that is made from whole milk. Fats and oils Meat fat, or shortening. Cocoa butter, hydrogenated oils,  palm oil, coconut oil, palm kernel oil. Solid fats and shortenings, including bacon fat, salt pork, lard, and butter. Nondairy cream substitutes. Salad dressings with cheese or sour cream. Beverages Regular sodas and juice drinks with added sugar. Sweets and desserts Frosting. Pudding. Cookies. Cakes. Pies. Milk chocolate or white chocolate. Buttered syrups. Full-fat ice cream or ice cream drinks. The items listed above may not be a complete list of foods and drinks to avoid. Contact a dietitian for more information. Summary Heart-healthy meal planning includes eating less unhealthy fats, eating more healthy fats, and making other changes in your diet. Eat a balanced diet. This includes fruits and vegetables, low-fat or nonfat dairy, lean protein, nuts and legumes, whole grains, and heart-healthy oils and fats. This information is not intended to replace advice given to you by your health care provider. Make sure you discuss any questions you have with your health care provider. Document Revised: 03/30/2021 Document Reviewed: 03/30/2021 Elsevier Patient Education  2022 [employer].

## 2021-08-08 NOTE — Telephone Encounter (Signed)
Pt was here for appointment with Edd Fabian, FNP-C, pt's CPAP machine has stopped downloading. It is still working just not downloading for compliance. We did try to download w/[employer] and this shows that she has not used her machine since 03-06-2021. This must have been when it stopped downloading. Sleep study was 06-23-2007 S/w Burna Mortimer she states that pt will need appt with Dr Tresa Endo for compliance and she will need to bring her machine with her.  Pt notified, informed pt that the scheduler will be calling her to schedule appointment. Also informed her to bring machine with her to this appointment.  Message sent to scheduler to call and schedule an appointment.

## 2021-08-25 NOTE — Telephone Encounter (Signed)
Pt has appt 11-3- with TK

## 2021-09-19 ENCOUNTER — Other Ambulatory Visit: Payer: Self-pay | Admitting: *Deleted

## 2021-09-19 MED ORDER — CRESTOR 20 MG PO TABS: ORAL_TABLET | ORAL | 3 refills | Status: DC

## 2021-10-04 ENCOUNTER — Other Ambulatory Visit: Payer: Self-pay

## 2021-10-05 ENCOUNTER — Encounter: Payer: Self-pay | Admitting: Cardiovascular Disease

## 2021-10-05 ENCOUNTER — Ambulatory Visit: Payer: Medicare Other | Admitting: Cardiovascular Disease

## 2021-10-05 ENCOUNTER — Other Ambulatory Visit: Payer: Self-pay

## 2021-10-05 VITALS — BP 127/75 | HR 67 | Ht 68.0 in

## 2021-10-05 DIAGNOSIS — I2510 Atherosclerotic heart disease of native coronary artery without angina pectoris: Secondary | ICD-10-CM

## 2021-10-05 DIAGNOSIS — I480 Paroxysmal atrial fibrillation: Secondary | ICD-10-CM

## 2021-10-05 DIAGNOSIS — G4733 Obstructive sleep apnea (adult) (pediatric): Secondary | ICD-10-CM

## 2021-10-05 DIAGNOSIS — Z953 Presence of xenogenic heart valve: Secondary | ICD-10-CM | POA: Diagnosis not present

## 2021-10-05 DIAGNOSIS — E785 Hyperlipidemia, unspecified: Secondary | ICD-10-CM

## 2021-10-05 NOTE — Patient Instructions (Signed)
Medication Instructions:  Your physician recommends that you continue on your current medications as directed. Please refer to the Current Medication list given to you today.  *If you need a refill on your cardiac medications before your next appointment, please call your pharmacy*   Testing/Procedures: Our office sleep coordinator will contact you about a new CPAP machine   Follow-Up: At [facility], you and your health needs are our priority.  As part of our continuing mission to provide you with exceptional heart care, we have created designated Provider Care Teams.  These Care Teams include your primary Cardiologist (physician) and Advanced Practice Providers (APPs -  Physician Assistants and Nurse Practitioners) who all work together to provide you with the care you need, when you need it.  We recommend signing up for the patient portal called "MyChart".  Sign up information is provided on this After Visit Summary.  MyChart is used to connect with patients for Virtual Visits (Telemedicine).  Patients are able to view lab/test results, encounter notes, upcoming appointments, etc.  Non-urgent messages can be sent to your provider as well.   To learn more about what you can do with MyChart, go to [email or web address].    Your next appointment:   6 month(s)  The format for your next appointment:   In Person  Provider:   Nicki Guadalajara, MD   Other Instructions

## 2021-10-05 NOTE — Progress Notes (Signed)
Cardiology Office Note    Date:  10/12/2021   ID:  Victoria Delgado, DOB [birth year], MRN [id number]  PCP:  Sueanne Margarita, DO  Cardiologist:  Shelva Majestic, MD (sleep); Dr. Percival Spanish  Reestablishment of care  History of Present Illness:  Victoria Delgado is a 80 y.o. female who is a former cardiac patient of Dr. Rex Kras and now sees Dr. Percival Spanish for cardiology care.  I had seen her remotely and last saw her in March 2015 in follow-up of her struct of sleep apnea.  She presents for sleep evaluation.  Victoria Delgado has a history of obstructive sleep apnea which dates back to 2008 when she was found to have moderate overall sleep apnea with an AHI of 25 events per hour.  However during REM sleep her AHI was significantly elevated at 68.7.  On her initial sleep study there was significant nocturnal oxygen desaturation to a nadir of 78% with REM sleep and she had loud snoring.  In 2015 she received a new ResMed air sense 10 AutoSet unit and her pressure was set at 8 cm of water.  A download at her last March 2015 visit with me was excellent with an AHI of 1.3.  Since I saw her, she underwent stenting of her left circumflex diagonal vessel at [facility] in May 2017.  She subsequently underwent repeat catheterization in April 2019.  She ultimately developed severe aortic stenosis and underwent successful TAVR at [facility] in October 2021.  Saw Dr. Percival Spanish in March 31, 2021 for his most recent cardiology evaluation.  From a sleep perspective, with the upgrade to 5G technology, her CPAP unit is no longer wireless.  Her set up date had been in 2015.  Since initiating CPAP she believes this has been a "life changer."  He cannot sleep without it.  Her compliance is always 100%.  She also has a mini device which she has used with travel.  Presently she goes to bed around midnight and wakes up at 6 AM.  She brought her machine with her for [place] today and due to the upgrade to [employer] her last wireless  ability ended on March 06, 2021.  A download from March 6 through March 06, 2021 showed 100% compliance with average use of 7 hours and 15 minutes.  AHI is 1.2 at 8 cm water pressure.  We provided her with a new CPAP card and we were able to download her most recent data from October 5 through October 05, 2021.  AHI is excellent at 0.9.  Is only she is unaware of breakthrough snoring.  Her sleep is restorative.  She denies daytime sleepiness.  She qualifies for a new machine and presents for evaluation.   Past Medical History:  Diagnosis Date   Abdominal pain    Arthritis    osteoarthritis-hips,knees, back, hands   Asymmetric septal hypertrophy (HCC)    Atrial fibrillation (HCC)    CAD (coronary artery disease)    2 drug-eluting stents placed in the circumflex leading into a marginal.  May 2017.  [name].   catheterization on 03/19/2018, this showed patent stent in the proximal to mid left circumflex artery, 20% ostial left circumflex artery disease, 40% OM 3 disease, widely patent stent in the ostial D1, 20% ostial to proximal LAD disease, 20% mid LAD disease.   Cataract    corrective surgery done   Fatty liver    Gallstones    GERD (gastroesophageal reflux disease)  GERD (gastroesophageal reflux disease)    Heart palpitations    Hepatitis    hepatitis A; past hx.,many yrs ago ? food source   Hernia, hiatal    Hyperlipidemia    Hypertension    Obesity    Sleep apnea    CPAP    Past Surgical History:  Procedure Laterality Date   APPENDECTOMY  2004   CATARACT EXTRACTION W/ INTRAOCULAR LENS IMPLANT     both eyes   CHOLECYSTECTOMY     LEFT HEART CATH AND CORONARY ANGIOGRAPHY N/A 03/19/2018   Procedure: LEFT HEART CATH AND CORONARY ANGIOGRAPHY;  Surgeon: Burnell Blanks, MD;  Location: [place] [facility];  Service: Cardiovascular;  Laterality: N/A;   NM MYOCAR PERF WALL MOTION  08/15/04   No ischemia   RETINAL DETACHMENT SURGERY Bilateral    remains with slight hazy  vision with lights   RIGHT/LEFT HEART CATH AND CORONARY ANGIOGRAPHY N/A 07/11/2020   Procedure: RIGHT/LEFT HEART CATH AND CORONARY ANGIOGRAPHY;  Surgeon: Sherren Mocha, MD;  Location: [place] [facility];  Service: Cardiovascular;  Laterality: N/A;   TOTAL HIP ARTHROPLASTY Right 08/04/2014   Procedure: RIGHT TOTAL HIP ARTHROPLASTY ANTERIOR APPROACH;  Surgeon: Gearlean Alf, MD;  Location: [facility];  Service: Orthopedics;  Laterality: Right;   TOTAL HIP ARTHROPLASTY Left 01/05/2015   Procedure: LEFT TOTAL HIP ARTHROPLASTY ANTERIOR APPROACH;  Surgeon: Gearlean Alf, MD;  Location: [facility];  Service: Orthopedics;  Laterality: Left;   US ECHOCARDIOGRAPHY  06/25/2012   Moderate ASH,LV hyperdynamic,LA is mod. dilated,trace MR,TR,AI    Current Medications: Outpatient Medications Prior to Visit  Medication Sig Dispense Refill   albuterol (VENTOLIN HFA) 108 (90 Base) MCG/ACT inhaler Inhale 2 puffs into the lungs every 4 (four) hours as needed for wheezing or shortness of breath.      alum & mag hydroxide-simeth (MAALOX/MYLANTA) 200-200-20 MG/5ML suspension Take 30 mLs by mouth every 6 (six) hours as needed for indigestion or heartburn.     Ascorbic Acid (VITAMIN C) 1000 MG tablet Take 1,000 mg by mouth daily.     aspirin EC 81 MG tablet Take 81 mg by mouth daily. Swallow whole.     cholecalciferol (VITAMIN D3) 25 MCG (1000 UNIT) tablet Take 1,000 Units by mouth 2 (two) times daily.     CRESTOR 20 MG tablet TAKE 1 TABLET(20 MG) BY MOUTH DAILY 90 tablet 3   furosemide (LASIX) 40 MG tablet Take 1 tablet (40 mg total) by mouth daily. 90 tablet 3   loteprednol (LOTEMAX) 0.5 % ophthalmic suspension Place 1 drop into both eyes daily as needed (imflammation).      metoprolol succinate (TOPROL XL) 50 MG 24 hr tablet TAKE 1 TAKE BY MOUTH EVERY MORNING AND 1/2 TABLET EVERY EVENING 135 tablet 3   Multiple Vitamin (MULTIVITAMIN) tablet Take 1 tablet by mouth daily.     nitroGLYCERIN (NITROSTAT) 0.4 MG SL tablet Place 1  tablet (0.4 mg total) under the tongue every 5 (five) minutes as needed for chest pain. 25 tablet 3   ezetimibe (ZETIA) 10 MG tablet Take 1 tablet (10 mg total) by mouth daily. 90 tablet 3   No facility-administered medications prior to visit.     Allergies:   Codeine, Kenalog [triamcinolone acetonide], Relafen [nabumetone], Epinephrine, Penicillins, and Tramadol   Social History   Socioeconomic History   Marital status: Married    Spouse name: Not on file   Number of children: 2   Years of education: Not on file  Highest education level: Not on file  Occupational History    Employer: OTHER  Tobacco Use   Smoking status: Former   Smokeless tobacco: Never   Tobacco comments:    QUIT IN 1990  Vaping Use   Vaping Use: Never used  Substance and Sexual Activity   Alcohol use: No    Alcohol/week: 0.0 standard drinks   Drug use: No   Sexual activity: Yes  Other Topics Concern   Not on file  Social History Narrative   Two grandchildren.            Social Determinants of Health   Financial Resource Strain: Not on file  Food Insecurity: Not on file  Transportation Needs: Not on file  Physical Activity: Not on file  Stress: Not on file  Social Connections: Not on file     Family History:  The patient's family history includes CVA in her mother; Cancer in her father; Diabetes in an other family member; Esophageal cancer in an other family member; Hypertension in her mother; Lung cancer (age of onset: 44) in her father.   ROS General: Negative; No fevers, chills, or night sweats;  HEENT: Negative; No changes in vision or hearing, sinus congestion, difficulty swallowing Pulmonary: Negative; No cough, wheezing, shortness of breath, hemoptysis Cardiovascular: CAD, status post stents, status post TAVR for aortic stenosis GI: Negative; No nausea, vomiting, diarrhea, or abdominal pain GU: Negative; No dysuria, hematuria, or difficulty voiding Musculoskeletal: Negative; no  myalgias, joint pain, or weakness Hematologic/Oncology: Negative; no easy bruising, bleeding Endocrine: Negative; no heat/cold intolerance; no diabetes Neuro: Negative; no changes in balance, headaches Skin: Negative; No rashes or skin lesions Psychiatric: Negative; No behavioral problems, depression Sleep: Positive for OSA originally diagnosed in 2008.  She received her second machine in 2015.  She uses CPAP with 100% compliance and is unaware of any breakthrough snoring, daytime sleepiness, bruxism, restless legs, hypnogognic hallucinations, no cataplexy Other comprehensive 14 point system review is negative.   PHYSICAL EXAM:   VS:  BP 127/75   Pulse 67   Ht _0  (1.727 m)   BMI 42.63 kg/m     Blood pressure by me was 128/72.  Wt Readings from Last 3 Encounters:  08/08/21 280 lb 6.4 oz (127.2 kg)  03/31/21 270 lb (122.5 kg)  03/07/21 269 lb 9.6 oz (122.3 kg)    General: Alert, oriented, no distress.  Skin: normal turgor, no rashes, warm and dry HEENT: Normocephalic, atraumatic. Pupils equal round and reactive to light; sclera anicteric; extraocular muscles intact;  Nose without nasal septal hypertrophy Mouth/Parynx benign; Mallinpatti scale 3 Neck: No JVD, no carotid bruits; normal carotid upstroke Lungs: clear to ausculatation and percussion; no wheezing or rales Chest wall: without tenderness to palpitation Heart: PMI not displaced, RRR, s1 s2 normal, 1/6 systolic murmur, no diastolic murmur, no rubs, gallops, thrills, or heaves Abdomen: soft, nontender; no hepatosplenomehaly, BS+; abdominal aorta nontender and not dilated by palpation. Back: no CVA tenderness Pulses 2+ Musculoskeletal: full range of motion, normal strength, no joint deformities Extremities: no clubbing cyanosis or edema, Homan's sign negative  Neurologic: grossly nonfocal; Cranial nerves grossly wnl Psychologic: Normal mood and affect   Studies/Labs Reviewed:   EKG:  EKG is ordered today.  ECG  (independently read by me):  NSR at 67,mild RV conduction delay, LAHB  Recent Labs: BMP Latest Ref Rng & Units 07/03/2021 03/28/2021 03/07/2021  Glucose 65 - 99 mg/dL 120(H) 123(H) -  BUN 8 - 27 mg/dL 15 19 -  Creatinine 0.57 - 1.00 mg/dL 0.86 1.04(H) -  BUN/Creat Ratio 12 - _0 -  Sodium 134 - 144 mmol/L 143 145(H) -  Potassium 3.5 - 5.2 mmol/L 4.1 5.5(H) 4.1  Chloride 96 - 106 mmol/L 103 104 -  CO2 20 - 29 mmol/L 23 26 -  Calcium 8.7 - 10.3 mg/dL 9.8 10.0 -     Hepatic Function Latest Ref Rng & Units 12/18/2020 06/02/2019 11/13/2018  Total Protein 6.5 - 8.1 g/dL 7.5 7.0 6.7  Albumin 3.5 - 5.0 g/dL 4.4 4.8(H) 4.2  AST 15 - 41 U/L _1 ALT 0 - 44 U/L _2 Alk Phosphatase 38 - 126 U/L 85 79 81  Total Bilirubin 0.3 - 1.2 mg/dL 0.8 0.4 0.4  Bilirubin, Direct 0.00 - 0.40 mg/dL - 0.13 0.11    CBC Latest Ref Rng & Units 12/18/2020 07/11/2020 07/11/2020  WBC 4.0 - 10.5 K/uL 7.8 - -  Hemoglobin 12.0 - 15.0 g/dL 15.9(H) 13.3 13.3  Hematocrit 36.0 - 46.0 % 47.7(H) 39.0 39.0  Platelets 150 - 400 K/uL 235 - -   Lab Results  Component Value Date   MCV 90.2 12/18/2020   MCV 89 07/07/2020   MCV 92 03/16/2020   Lab Results  Component Value Date   TSH 3.139 06/09/2019   Lab Results  Component Value Date   HGBA1C 5.9 02/24/2020     BNP    Component Value Date/Time   BNP 80.9 10/30/2016 1432    ProBNP No results found for: PROBNP   Lipid Panel     Component Value Date/Time   CHOL 189 03/28/2021 0918   TRIG 196 (H) 03/28/2021 0918   HDL 38 (L) 03/28/2021 0918   CHOLHDL 5.0 (H) 03/28/2021 0918   CHOLHDL 5.1 03/17/2018 0034   VLDL 37 03/17/2018 0034   LDLCALC 116 (H) 03/28/2021 0918   LABVLDL 35 03/28/2021 0918     RADIOLOGY: No results found.   Additional studies/ records that were reviewed today include:  I was able to obtain a download from March 6 through March 06, 2021 which was the last day her machine was able to transmit wirelessly.  She was given a  new card for her machine and a download from October 5 through October 05, 2021 was obtained in the office today   ASSESSMENT:    1. OSA (obstructive sleep apnea)   2. Coronary artery disease involving native coronary artery of native heart without angina pectoris   3. 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 PAF (paroxysmal atrial fibrillation) ([place])   5. Dyslipidemia    PLAN:  Victoria Delgado is a very pleasant 80 year old female who has established CAD, a remote history of atrial fibrillation, and has undergone stenting to her circumflex and diagonal vessel.  She developed severe aortic stenosis and had undergone TAVR with a 23 mm Edwards SAPIEN S3 ultra valve done at [facility] on September 27, 2020.  She has a history of obstructive sleep apnea originally diagnosed in 2008.  She received a new ResMed air sense 10 CPAP machine in 2015.  With the upgrade to [employer], her machine from 2015 is no longer wireless.  She continues to have excellent compliance with CPAP use which has been a "life changer "and she cannot sleep without it.  We provided her machine with a new card today so that we can obtain data.  She has 100% compliance and her most recent  download shows excellent AHI at 0.9 at 8 cm water pressure.  She qualifies for a new machine and we will place an order for her to obtain a new ResMed air sense 11 CPAP auto unit.  Unfortunately, due to supply chain issues typically there has been at times up to a 4 to 37-monthwait over the past 6 months.  When she receives her new machine, see her back within 90 days per Medicare requirements.  She will continue to see Dr. HPercival Spanishfor follow-up cardiology care and [facility]   Medication Adjustments/Labs and Tests Ordered: Current medicines are reviewed at length with the patient today.  Concerns regarding medicines are outlined above.  Medication changes, Labs and  Tests ordered today are listed in the Patient Instructions below. Patient Instructions  Medication Instructions:  Your physician recommends that you continue on your current medications as directed. Please refer to the Current Medication list given to you today.  *If you need a refill on your cardiac medications before your next appointment, please call your pharmacy*   Testing/Procedures: Our office sleep coordinator will contact you about a new CPAP machine   Follow-Up: [facility] and your [facility] needs are our priority.  As part of our continuing mission to provide you with exceptional heart care, we have created designated Provider Care Teams.  These Care Teams include your primary Cardiologist (physician) and Advanced Practice Providers (APPs -  Physician Assistants and Nurse Practitioners) who all work together to provide you with the care you need, when you need it.  We recommend signing up for the patient portal called "MyChart".  Sign up information is provided on this After Visit Summary.  MyChart is used to connect with patients for Virtual Visits (Telemedicine).  Patients are able to view lab/test results, encounter notes, upcoming appointments, etc.  Non-urgent messages can be sent to your provider as well.   To learn more about what you can do with MyChart, go to [email or web address]    Your next appointment:   6 month(s)  The format for your next appointment:   In Person  Provider:   TShelva Majestic MD   Other Instructions    Signed, TShelva Majestic MD  10/12/2021 6:16 PM    [facility] [address] [place]  [id number]: [phone]

## 2021-10-12 ENCOUNTER — Encounter: Payer: Self-pay | Admitting: Cardiovascular Disease

## 2021-10-19 ENCOUNTER — Telehealth: Payer: Self-pay | Admitting: *Deleted

## 2021-10-19 NOTE — Telephone Encounter (Signed)
Order for AirSense 11 Auto replacement unit sent to [facility].

## 2021-10-19 NOTE — Telephone Encounter (Signed)
-----   Message from Gaynelle Cage, [place] sent at 10/05/2021  2:02 PM EDT ----- Order Lolita Cram 11 auto

## 2021-11-14 ENCOUNTER — Encounter: Payer: Self-pay | Admitting: Internal Medicine

## 2021-11-14 ENCOUNTER — Ambulatory Visit: Payer: Medicare Other | Admitting: Internal Medicine

## 2021-11-14 VITALS — BP 124/60 | HR 75 | Ht 68.0 in | Wt 280.0 lb

## 2021-11-14 DIAGNOSIS — K921 Melena: Secondary | ICD-10-CM

## 2021-11-14 DIAGNOSIS — D509 Iron deficiency anemia, unspecified: Secondary | ICD-10-CM | POA: Diagnosis not present

## 2021-11-14 DIAGNOSIS — K219 Gastro-esophageal reflux disease without esophagitis: Secondary | ICD-10-CM

## 2021-11-14 NOTE — Patient Instructions (Signed)
Please follow up as needed 

## 2021-11-14 NOTE — Progress Notes (Signed)
HISTORY OF PRESENT ILLNESS:  Victoria Delgado is a pleasant 80 y.o. female with multiple significant medical problems as listed below who presents today for follow-up.  The patient was initially evaluated in this office August 31, 2020 regarding reports of dark stools 7 months prior to her office visit and iron deficiency anemia which responded to iron replacement therapy.  See that dictation.  Patient previously reported normal colonoscopy in 2003 and negative Cologuard testing in 2017.  At the time of her visit she was taken off her chronic anticoagulant and anticipating TAVR procedure at [facility] for severe aortic stenosis.  This was performed October 2021 with an excellent result.  She recently saw her new cardiologist, Dr. Romeo Apple, November 03, 2021.  Reviewed.  Good valve dynamics on echo.  Normal EF.  She has not had further problems with A. fib.  She is no longer on anticoagulation.  For history of GERD and upper GI mucosal protection I prescribed for her omeprazole 20 mg daily.  She continues on this.  Patient tells me that her GI review of systems is negative except for bloating.  I have reviewed outside records and laboratories.  Blood work from Apr 21, 2021 shows normal iron studies.  Patient tells me that her last hemoglobin was normal.  She did have a CT angiogram January 2022.  No acute abnormalities of the abdomen and pelvis.  Benign postop fluid collection in the groin noted.  REVIEW OF SYSTEMS:  All non-GI ROS negative unless otherwise stated in the HPI except for sinus and allergy, arthritis, back pain, fatigue, shortness of breath  Past Medical History:  Diagnosis Date   Abdominal pain    Arthritis    osteoarthritis-hips,knees, back, hands   Asymmetric septal hypertrophy (HCC)    Atrial fibrillation (HCC)    CAD (coronary artery disease)    2 drug-eluting stents placed in the circumflex leading into a marginal.  May 2017.  [name].   catheterization on 03/19/2018, this showed  patent stent in the proximal to mid left circumflex artery, 20% ostial left circumflex artery disease, 40% OM 3 disease, widely patent stent in the ostial D1, 20% ostial to proximal LAD disease, 20% mid LAD disease.   Cataract    corrective surgery done   Fatty liver    Gallstones    GERD (gastroesophageal reflux disease)    GERD (gastroesophageal reflux disease)    Heart palpitations    Hepatitis    hepatitis A; past hx.,many yrs ago ? food source   Hernia, hiatal    Hyperlipidemia    Hypertension    Obesity    Sleep apnea    CPAP    Past Surgical History:  Procedure Laterality Date   APPENDECTOMY  2004   CATARACT EXTRACTION W/ INTRAOCULAR LENS IMPLANT     both eyes   CHOLECYSTECTOMY     LEFT HEART CATH AND CORONARY ANGIOGRAPHY N/A 03/19/2018   Procedure: LEFT HEART CATH AND CORONARY ANGIOGRAPHY;  Surgeon: Kathleene Hazel, MD;  Location: [facility];  Service: Cardiovascular;  Laterality: N/A;   NM MYOCAR PERF WALL MOTION  08/15/04   No ischemia   RETINAL DETACHMENT SURGERY Bilateral    remains with slight hazy vision with lights   RIGHT/LEFT HEART CATH AND CORONARY ANGIOGRAPHY N/A 07/11/2020   Procedure: RIGHT/LEFT HEART CATH AND CORONARY ANGIOGRAPHY;  Surgeon: Tonny Bollman, MD;  [facility]: [facility];  Service: Cardiovascular;  Laterality: N/A;   TOTAL HIP ARTHROPLASTY Right 08/04/2014  Procedure: RIGHT TOTAL HIP ARTHROPLASTY ANTERIOR APPROACH;  Surgeon: Loanne Drilling, MD;  Location: [facility];  Service: Orthopedics;  Laterality: Right;   TOTAL HIP ARTHROPLASTY Left 01/05/2015   Procedure: LEFT TOTAL HIP ARTHROPLASTY ANTERIOR APPROACH;  Surgeon: Loanne Drilling, MD;  Location: [facility];  Service: Orthopedics;  Laterality: Left;   US ECHOCARDIOGRAPHY  06/25/2012   Moderate ASH,LV hyperdynamic,LA is mod. dilated,trace MR,TR,AI    Social History Victoria Delgado  reports that she has quit smoking. She has never used smokeless tobacco. She reports that she does not  drink alcohol and does not use drugs.  family history includes CVA in her mother; Cancer in her father; Diabetes in an other family member; Esophageal cancer in an other family member; Hypertension in her mother; Lung cancer (age of onset: 53) in her father.  Allergies  Allergen Reactions   Codeine Nausea Only   Kenalog [Triamcinolone Acetonide] Other (See Comments)    Had A.fib after she received the injection."Steroid meds"   Relafen [Nabumetone] Other (See Comments)    Edema    Epinephrine Palpitations   Penicillins Rash    40 years ago, injection site was red and swollen.  rash, facial/tongue/throat swelling, SOB or lightheadedness with hypotension: Yes Has patient had a PCN reaction causing immediate  Has patient had a PCN reaction causing severe rash involving mucus membranes or skin necrosis: NO Has patient had a PCN reaction that required hospitalization. No  Has patient had a PCN reaction occurring within the last 10 years: No If all of the above answers are "NO", then may proceed with Cephalosporin use.     Tramadol Other (See Comments)    Auditory halllucinations       PHYSICAL EXAMINATION: Vital signs: BP 124/60    Pulse 75    Ht 5\' 8"  (1.727 m)    Wt 280 lb (127 kg)    BMI 42.57 kg/m   Constitutional: Pleasant, obese, unhealthy appearing, no acute distress Psychiatric: alert and oriented x3, cooperative Eyes: extraocular movements intact, anicteric, conjunctiva pink Mouth: Mask Neck: supple no lymphadenopathy Cardiovascular: heart regular rate and rhythm, 2/6 systolic murmur Lungs: clear to auscultation bilaterally Abdomen: soft, obese, nontender, nondistended, no obvious ascites, no peritoneal signs, normal bowel sounds, no organomegaly Rectal: Omitted Extremities: no clubbing or cyanosis.  Trace lower extremity edema bilaterally Skin: no lesions on visible extremities Neuro: No focal deficits.  Cranial nerves intact  ASSESSMENT:  1.  History of iron  deficiency anemia.  Resolved with iron replacement.  Etiology unclear.  This was in the face of chronic anticoagulation therapy. 2.  GERD.  On PPI 3.  Prior colonoscopy and Cologuard testing were negative 4.  Multiple medical problems   PLAN:  1.  Recommended colonoscopy and upper endoscopy to evaluate prior history of iron deficiency anemia and possible melena.  I discussed with the patient possible etiologies including AVMs and neoplasia.  She is quite aware.  However, as she is having no active GI complaints and her anemia has resolved she wishes to forego any plans for endoscopic assessments at this time.  She acknowledges the potential for missed pathology. 2.  Continue daily PPI for control of GERD and upper GI mucosal protection 3.  Resume general medical care with PCP.  GI follow-up as needed

## 2021-11-22 ENCOUNTER — Other Ambulatory Visit: Payer: Self-pay

## 2021-11-22 ENCOUNTER — Encounter (HOSPITAL_COMMUNITY): Payer: Self-pay

## 2021-11-22 ENCOUNTER — Inpatient Hospital Stay (HOSPITAL_COMMUNITY)
Admission: EM | Admit: 2021-11-22 | Discharge: 2021-11-28 | DRG: 177 | Disposition: A | Discharge status: Home / Self Care | Payer: Medicare Other | Attending: Family Medicine | Admitting: Family Medicine

## 2021-11-22 DIAGNOSIS — I4891 Unspecified atrial fibrillation: Secondary | ICD-10-CM | POA: Diagnosis not present

## 2021-11-22 DIAGNOSIS — Z9841 Cataract extraction status, right eye: Secondary | ICD-10-CM

## 2021-11-22 DIAGNOSIS — M79602 Pain in left arm: Secondary | ICD-10-CM | POA: Diagnosis present

## 2021-11-22 DIAGNOSIS — U071 COVID-19: Secondary | ICD-10-CM | POA: Diagnosis not present

## 2021-11-22 DIAGNOSIS — J9601 Acute respiratory failure with hypoxia: Secondary | ICD-10-CM | POA: Diagnosis present

## 2021-11-22 DIAGNOSIS — I2510 Atherosclerotic heart disease of native coronary artery without angina pectoris: Secondary | ICD-10-CM | POA: Diagnosis present

## 2021-11-22 DIAGNOSIS — E782 Mixed hyperlipidemia: Secondary | ICD-10-CM | POA: Diagnosis present

## 2021-11-22 DIAGNOSIS — Z961 Presence of intraocular lens: Secondary | ICD-10-CM | POA: Diagnosis present

## 2021-11-22 DIAGNOSIS — E041 Nontoxic single thyroid nodule: Secondary | ICD-10-CM | POA: Diagnosis present

## 2021-11-22 DIAGNOSIS — G4730 Sleep apnea, unspecified: Secondary | ICD-10-CM | POA: Diagnosis present

## 2021-11-22 DIAGNOSIS — Z6841 Body Mass Index (BMI) 40.0 and over, adult: Secondary | ICD-10-CM

## 2021-11-22 DIAGNOSIS — I5031 Acute diastolic (congestive) heart failure: Secondary | ICD-10-CM | POA: Diagnosis present

## 2021-11-22 DIAGNOSIS — Z955 Presence of coronary angioplasty implant and graft: Secondary | ICD-10-CM

## 2021-11-22 DIAGNOSIS — I110 Hypertensive heart disease with heart failure: Secondary | ICD-10-CM | POA: Diagnosis present

## 2021-11-22 DIAGNOSIS — Z880 Allergy status to penicillin: Secondary | ICD-10-CM

## 2021-11-22 DIAGNOSIS — E1165 Type 2 diabetes mellitus with hyperglycemia: Secondary | ICD-10-CM | POA: Diagnosis present

## 2021-11-22 DIAGNOSIS — I959 Hypotension, unspecified: Secondary | ICD-10-CM | POA: Diagnosis present

## 2021-11-22 DIAGNOSIS — Z9049 Acquired absence of other specified parts of digestive tract: Secondary | ICD-10-CM

## 2021-11-22 DIAGNOSIS — E785 Hyperlipidemia, unspecified: Secondary | ICD-10-CM | POA: Diagnosis present

## 2021-11-22 DIAGNOSIS — G4733 Obstructive sleep apnea (adult) (pediatric): Secondary | ICD-10-CM | POA: Diagnosis present

## 2021-11-22 DIAGNOSIS — K760 Fatty (change of) liver, not elsewhere classified: Secondary | ICD-10-CM | POA: Diagnosis present

## 2021-11-22 DIAGNOSIS — K219 Gastro-esophageal reflux disease without esophagitis: Secondary | ICD-10-CM | POA: Diagnosis present

## 2021-11-22 DIAGNOSIS — M160 Bilateral primary osteoarthritis of hip: Secondary | ICD-10-CM | POA: Diagnosis present

## 2021-11-22 DIAGNOSIS — I248 Other forms of acute ischemic heart disease: Secondary | ICD-10-CM | POA: Diagnosis present

## 2021-11-22 DIAGNOSIS — E669 Obesity, unspecified: Secondary | ICD-10-CM | POA: Diagnosis present

## 2021-11-22 DIAGNOSIS — R0603 Acute respiratory distress: Secondary | ICD-10-CM

## 2021-11-22 DIAGNOSIS — Z9842 Cataract extraction status, left eye: Secondary | ICD-10-CM

## 2021-11-22 DIAGNOSIS — Z96643 Presence of artificial hip joint, bilateral: Secondary | ICD-10-CM | POA: Diagnosis present

## 2021-11-22 DIAGNOSIS — Z8249 Family history of ischemic heart disease and other diseases of the circulatory system: Secondary | ICD-10-CM

## 2021-11-22 DIAGNOSIS — Z79899 Other long term (current) drug therapy: Secondary | ICD-10-CM

## 2021-11-22 DIAGNOSIS — I480 Paroxysmal atrial fibrillation: Secondary | ICD-10-CM | POA: Diagnosis present

## 2021-11-22 DIAGNOSIS — Z885 Allergy status to narcotic agent status: Secondary | ICD-10-CM

## 2021-11-22 DIAGNOSIS — I421 Obstructive hypertrophic cardiomyopathy: Secondary | ICD-10-CM | POA: Diagnosis present

## 2021-11-22 DIAGNOSIS — Z952 Presence of prosthetic heart valve: Secondary | ICD-10-CM

## 2021-11-22 DIAGNOSIS — Z7982 Long term (current) use of aspirin: Secondary | ICD-10-CM

## 2021-11-22 DIAGNOSIS — N179 Acute kidney failure, unspecified: Secondary | ICD-10-CM | POA: Diagnosis present

## 2021-11-22 DIAGNOSIS — Z888 Allergy status to other drugs, medicaments and biological substances status: Secondary | ICD-10-CM

## 2021-11-22 DIAGNOSIS — Z87891 Personal history of nicotine dependence: Secondary | ICD-10-CM

## 2021-11-22 LAB — CBC WITH DIFFERENTIAL/PLATELET
Abs Immature Granulocytes: 0.01 10*3/uL (ref 0.00–0.07)
Basophils Absolute: 0 10*3/uL (ref 0.0–0.1)
Basophils Relative: 0 %
Eosinophils Absolute: 0 10*3/uL (ref 0.0–0.5)
Eosinophils Relative: 0 %
HCT: 41.5 % (ref 36.0–46.0)
Hemoglobin: 13.5 g/dL (ref 12.0–15.0)
Immature Granulocytes: 0 %
Lymphocytes Relative: 25 %
Lymphs Abs: 1.3 10*3/uL (ref 0.7–4.0)
MCH: 29.9 pg (ref 26.0–34.0)
MCHC: 32.5 g/dL (ref 30.0–36.0)
MCV: 92 fL (ref 80.0–100.0)
Monocytes Absolute: 0.8 10*3/uL (ref 0.1–1.0)
Monocytes Relative: 14 %
Neutro Abs: 3.3 10*3/uL (ref 1.7–7.7)
Neutrophils Relative %: 61 %
Platelets: 180 10*3/uL (ref 150–400)
RBC: 4.51 MIL/uL (ref 3.87–5.11)
RDW: 14.7 % (ref 11.5–15.5)
WBC: 5.4 10*3/uL (ref 4.0–10.5)
nRBC: 0 % (ref 0.0–0.2)

## 2021-11-22 LAB — BASIC METABOLIC PANEL
Anion gap: 9 (ref 5–15)
BUN: 14 mg/dL (ref 8–23)
CO2: 21 mmol/L — ABNORMAL LOW (ref 22–32)
Calcium: 7.9 mg/dL — ABNORMAL LOW (ref 8.9–10.3)
Chloride: 101 mmol/L (ref 98–111)
Creatinine, Ser: 1.04 mg/dL — ABNORMAL HIGH (ref 0.44–1.00)
GFR, Estimated: 55 mL/min — ABNORMAL LOW (ref >60)
Glucose, Bld: 153 mg/dL — ABNORMAL HIGH (ref 70–99)
Potassium: 3.7 mmol/L (ref 3.5–5.1)
Sodium: 131 mmol/L — ABNORMAL LOW (ref 135–145)

## 2021-11-22 LAB — MAGNESIUM: Magnesium: 1.9 mg/dL (ref 1.7–2.4)

## 2021-11-22 LAB — TROPONIN I (HIGH SENSITIVITY): Troponin I (High Sensitivity): 53 ng/L — ABNORMAL HIGH (ref <18)

## 2021-11-22 MED ORDER — SODIUM CHLORIDE 0.9 % IV BOLUS
500.0000 mL | Freq: Once | INTRAVENOUS | Status: AC
  Administered 2021-11-22: 23:00:00 500 mL via INTRAVENOUS

## 2021-11-22 MED ORDER — DILTIAZEM HCL-DEXTROSE 125-5 MG/125ML-% IV SOLN (PREMIX)
5.0000 mg/h | INTRAVENOUS | Status: DC
  Administered 2021-11-22: 23:00:00 5 mg/h via INTRAVENOUS
  Filled 2021-11-22: qty 125

## 2021-11-22 MED ORDER — DILTIAZEM LOAD VIA INFUSION
10.0000 mg | Freq: Once | INTRAVENOUS | Status: AC
  Administered 2021-11-22: 23:00:00 10 mg via INTRAVENOUS
  Filled 2021-11-22: qty 10

## 2021-11-22 MED ORDER — ACETAMINOPHEN 500 MG PO TABS
1000.0000 mg | ORAL_TABLET | Freq: Once | ORAL | Status: AC
  Administered 2021-11-22: 23:00:00 1000 mg via ORAL
  Filled 2021-11-22: qty 2

## 2021-11-22 NOTE — ED Notes (Signed)
Initial SPO2 on room air was 88%. NT placed pt on 2L O2 and SPO2 increased to 95%. RN notified.

## 2021-11-22 NOTE — ED Triage Notes (Signed)
Pt BIB [facility] c/o CP lasting appx 1 hr. Pt stating it feels like her Afib is starting back up. EMS VS- 130/90 BP, 100% RA, 140 HR, 160 CBG

## 2021-11-22 NOTE — ED Notes (Addendum)
Paused Cardizem d/t SBP dropping to 79. This RN will notify EDP. Pt not c/o of any pain or lightheadedness except for her L arm. Pt stated this happened previously when she was at [facility] when given Cardizem.

## 2021-11-22 NOTE — ED Notes (Signed)
MD Adela Lank notified about SBP in 70s, normal saline bolus per verbal order.

## 2021-11-22 NOTE — ED Notes (Signed)
This RN notified MD Adela Lank about pt's BP not improving after bolus. Cardizem still paused and pt not c/o any new pains except for L arm.

## 2021-11-22 NOTE — ED Provider Notes (Signed)
[place] EMERGENCY DEPARTMENT Provider Note   CSN: [id number] Arrival date & time: 11/22/21  2132     History Chief Complaint  Patient presents with   Chest Pain    Victoria Delgado is a 79 y.o. female with pertinent PMHx of CAD s/p PCI, paroxysmal A fib not on Eliquis, HTN, HLD, OSA on CPAP presenting to the ED after palpitations that started at 8 pm. She reports that she was in her usual state of health before sudden onset. She has a hx of paroxsymal atrial fibrillation not on anticoagulation 2/2 GI bleeding. She reports left arm pain. She denies CP, new SHOB, abdominal pain, trauma/falls. Patient is in Afib with HR 140s.       Chest Pain Associated symptoms: palpitations   Associated symptoms: no abdominal pain, no cough, no dizziness, no headache, no shortness of breath and no weakness       Past Medical History:  Diagnosis Date   Abdominal pain    Arthritis    osteoarthritis-hips,knees, back, hands   Asymmetric septal hypertrophy (HCC)    Atrial fibrillation (HCC)    CAD (coronary artery disease)    2 drug-eluting stents placed in the circumflex leading into a marginal.  May 2017.  [name].   catheterization on 03/19/2018, this showed patent stent in the proximal to mid left circumflex artery, 20% ostial left circumflex artery disease, 40% OM 3 disease, widely patent stent in the ostial D1, 20% ostial to proximal LAD disease, 20% mid LAD disease.   Cataract    corrective surgery done   Fatty liver    Gallstones    GERD (gastroesophageal reflux disease)    GERD (gastroesophageal reflux disease)    Heart palpitations    Hepatitis    hepatitis A; past hx.,many yrs ago ? food source   Hernia, hiatal    Hyperlipidemia    Hypertension    Obesity    Sleep apnea    CPAP    Patient Active Problem List   Diagnosis Date Noted   Dyslipidemia 05/18/2020   Educated about COVID-19 virus infection 05/18/2020   Pulmonary nodule, right 10/01/2019    Prediabetes 05/05/2019   Moderate aortic stenosis 03/19/2018   Abnormal nuclear stress test    Chest pain 03/16/2018   Unstable angina (HCC)    Right adrenal mass ([place]) 11/05/2017   Nonrheumatic aortic valve stenosis 10/11/2017   Coronary artery disease involving native coronary artery of native heart without angina pectoris 10/11/2017   LVH (left ventricular hypertrophy) 10/11/2017   PAF (paroxysmal atrial fibrillation) ([place]) 05/03/2015   OA (osteoarthritis) of hip 08/04/2014   Edema 08/17/2013   H/O bone density study 03/16/2013   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   Unspecified hereditary and idiopathic peripheral neuropathy 03/05/2013   Unspecified vitamin D deficiency 03/05/2013   Fatty infiltration of liver 02/08/2013   Mixed hyperlipidemia 02/08/2013   Obesity, Class III, BMI 40-49.9 (morbid obesity) ([place]) 02/08/2013   Cellulitis of groin, right 12/30/2012   DJD (degenerative joint disease) 12/17/2012   GERD (gastroesophageal reflux disease) 12/17/2012   Peripheral neuropathy 12/17/2012   HOCM (hypertrophic obstructive cardiomyopathy) ([place]) 10/16/2012   HTN (hypertension) 10/16/2012   Sleep apnea 10/16/2012   Heart palpitations     Past Surgical History:  Procedure Laterality Date   APPENDECTOMY  2004   CATARACT EXTRACTION W/ INTRAOCULAR LENS IMPLANT     both eyes   CHOLECYSTECTOMY     LEFT HEART CATH AND CORONARY ANGIOGRAPHY N/A 03/19/2018  Procedure: LEFT HEART CATH AND CORONARY ANGIOGRAPHY;  Surgeon: Burnell Blanks, MD;  Location: [place] [facility];  Service: Cardiovascular;  Laterality: N/A;   NM MYOCAR PERF WALL MOTION  08/15/04   No ischemia   RETINAL DETACHMENT SURGERY Bilateral    remains with slight hazy vision with lights   RIGHT/LEFT HEART CATH AND CORONARY ANGIOGRAPHY N/A 07/11/2020   Procedure: RIGHT/LEFT HEART CATH AND CORONARY ANGIOGRAPHY;  Surgeon: Sherren Mocha, MD;  Location: [place] [facility];  Service: Cardiovascular;  Laterality: N/A;    TOTAL HIP ARTHROPLASTY Right 08/04/2014   Procedure: RIGHT TOTAL HIP ARTHROPLASTY ANTERIOR APPROACH;  Surgeon: Gearlean Alf, MD;  Location: [facility];  Service: Orthopedics;  Laterality: Right;   TOTAL HIP ARTHROPLASTY Left 01/05/2015   Procedure: LEFT TOTAL HIP ARTHROPLASTY ANTERIOR APPROACH;  Surgeon: Gearlean Alf, MD;  Location: [facility];  Service: Orthopedics;  Laterality: Left;   US ECHOCARDIOGRAPHY  06/25/2012   Moderate ASH,LV hyperdynamic,LA is mod. dilated,trace MR,TR,AI     OB History     Gravida  2   Para  2   Term      Preterm      AB      Living         SAB      IAB      Ectopic      Multiple      Live Births              Family History  Problem Relation Age of Onset   Hypertension Mother    CVA Mother    Lung cancer Father 10       asbestos exposure   Cancer Father    Esophageal cancer Other        nephew   Diabetes Other        nephew   Colon cancer Neg Hx    Pancreatic cancer Neg Hx    Kidney disease Neg Hx    Liver disease Neg Hx     Social History   Tobacco Use   Smoking status: Former   Smokeless tobacco: Never   Tobacco comments:    QUIT IN 1990  Vaping Use   Vaping Use: Never used  Substance Use Topics   Alcohol use: No    Alcohol/week: 0.0 standard drinks   Drug use: No    Home Medications Prior to Admission medications   Medication Sig Start Date End Date Taking? Authorizing Provider  albuterol (VENTOLIN HFA) 108 (90 Base) MCG/ACT inhaler Inhale 2 puffs into the lungs every 4 (four) hours as needed for wheezing or shortness of breath.  12/21/19   [provider]  alum & mag hydroxide-simeth (MAALOX/MYLANTA) 200-200-20 MG/5ML suspension Take 30 mLs by mouth every 6 (six) hours as needed for indigestion or heartburn.    [provider]  Ascorbic Acid (VITAMIN C) 1000 MG tablet Take 1,000 mg by mouth daily.    [provider]  aspirin EC 81 MG tablet Take 81 mg by mouth daily. Swallow whole.     [provider]  cholecalciferol (VITAMIN D3) 25 MCG (1000 UNIT) tablet Take 1,000 Units by mouth 2 (two) times daily.    [provider]  CRESTOR 20 MG tablet TAKE 1 TABLET(20 MG) BY MOUTH DAILY 09/19/21   Minus Breeding, MD  ezetimibe (ZETIA) 10 MG tablet Take 1 tablet (10 mg total) by mouth daily. 03/31/21 08/08/21  Minus Breeding, MD  furosemide (LASIX) 40 MG tablet Take  1 tablet (40 mg total) by mouth daily. 06/22/21   Rollene Rotunda, MD  loteprednol (LOTEMAX) 0.5 % ophthalmic suspension Place 1 drop into both eyes daily as needed (imflammation).     [provider]  metoprolol succinate (TOPROL XL) 50 MG 24 hr tablet TAKE 1 TAKE BY MOUTH EVERY MORNING AND 1/2 TABLET EVERY EVENING 06/30/21   Rollene Rotunda, MD  Multiple Vitamin (MULTIVITAMIN) tablet Take 1 tablet by mouth daily.    [provider]  nitroGLYCERIN (NITROSTAT) 0.4 MG SL tablet Place 1 tablet (0.4 mg total) under the tongue every 5 (five) minutes as needed for chest pain. 05/07/16   Rosalio Macadamia, NP  omeprazole (PRILOSEC) 10 MG capsule Take 10 mg by mouth daily.    [provider]    Allergies    Codeine, Kenalog [triamcinolone acetonide], Relafen [nabumetone], Epinephrine, Penicillins, and Tramadol  Review of Systems   Review of Systems  Constitutional: Negative.   HENT: Negative.    Eyes: Negative.  Negative for photophobia and visual disturbance.  Respiratory: Negative.  Negative for cough, chest tightness, shortness of breath and wheezing.   Cardiovascular:  Positive for chest pain and palpitations. Negative for leg swelling.  Gastrointestinal:  Negative for abdominal distention and abdominal pain.  Genitourinary: Negative.   Musculoskeletal:  Negative for arthralgias, myalgias, neck pain and neck stiffness.  Neurological:  Negative for dizziness, tremors, weakness, light-headedness and headaches.  Psychiatric/Behavioral: Negative.     Physical Exam Updated Vital  Signs BP 104/71 (BP Location: Right Arm)    Pulse (!) 130    Temp 98 F (36.7 C)    Resp (!) 26    SpO2 94%   Physical Exam Constitutional:      General: She is not in acute distress.    Appearance: She is well-developed and normal weight. She is not ill-appearing.  HENT:     Head: Normocephalic and atraumatic.  Eyes:     Extraocular Movements: Extraocular movements intact.     Pupils: Pupils are equal, round, and reactive to light.  Cardiovascular:     Rate and Rhythm: Tachycardia present. Rhythm irregular.     Heart sounds: Normal heart sounds.  Pulmonary:     Effort: Pulmonary effort is normal. No tachypnea or respiratory distress.     Breath sounds: Normal breath sounds.  Abdominal:     General: Bowel sounds are normal.     Palpations: Abdomen is soft.  Musculoskeletal:        General: Normal range of motion.     Cervical back: Normal range of motion and neck supple.     Right lower leg: No tenderness. No edema.     Left lower leg: No tenderness. No edema.  Skin:    General: Skin is warm and dry.  Neurological:     General: No focal deficit present.     Mental Status: She is alert and oriented to person, place, and time.  Psychiatric:        Mood and Affect: Mood normal.        Behavior: Behavior normal.    ED Results / Procedures / Treatments   Labs (all labs ordered are listed, but only abnormal results are displayed) Labs Reviewed  RESP PANEL BY RT-PCR (FLU A&B, COVID) ARPGX2  CBC WITH DIFFERENTIAL/PLATELET  BASIC METABOLIC PANEL  MAGNESIUM  TROPONIN I (HIGH SENSITIVITY)    EKG None  Radiology No results found.  Procedures Procedures   Medications Ordered in ED Medications  diltiazem (  CARDIZEM) 1 mg/mL load via infusion 10 mg (has no administration in time range)  diltiazem (CARDIZEM) 125 mg in dextrose 5% 125 mL (1 mg/mL) infusion (has no administration in time range)    ED Course  I have reviewed the triage vital signs and the nursing  notes.  Pertinent labs & imaging results that were available during my care of the patient were reviewed by me and considered in my medical decision making (see chart for details).    MDM Rules/Calculators/A&P                         80 y.o. female with pertinent PMHx of CAD s/p PCI, paroxysmal A fib not on Eliquis, HTN, HLD, OSA on CPAP presenting with palpitations acutely since 8 pm tonight. Found to be in atrial fibrillation with HR 140's with BP 105/87. Loaded with Cardizem 10 mg and started on Cardizem drip. Not candidate for cardioversion given not on anticoagulation and in and out of Afib. Denies active chest pain but does complain of left arm pain. Trop pending. CBC wnl.   Sign out given to Dr Deno Etienne who will resume care for this patient at shift change.      Final Clinical Impression(s) / ED Diagnoses Final diagnoses:  None    Rx / DC Orders ED Discharge Orders     None        Lajean Manes, MD 11/22/21 [id number]    Lucrezia Starch, MD 11/24/21 1536

## 2021-11-23 ENCOUNTER — Inpatient Hospital Stay (HOSPITAL_COMMUNITY): Payer: Medicare Other

## 2021-11-23 ENCOUNTER — Encounter (HOSPITAL_COMMUNITY): Payer: Self-pay | Admitting: Internal Medicine

## 2021-11-23 ENCOUNTER — Emergency Department (HOSPITAL_COMMUNITY): Payer: Medicare Other

## 2021-11-23 DIAGNOSIS — E785 Hyperlipidemia, unspecified: Secondary | ICD-10-CM | POA: Diagnosis present

## 2021-11-23 DIAGNOSIS — N179 Acute kidney failure, unspecified: Secondary | ICD-10-CM | POA: Diagnosis present

## 2021-11-23 DIAGNOSIS — E782 Mixed hyperlipidemia: Secondary | ICD-10-CM | POA: Diagnosis present

## 2021-11-23 DIAGNOSIS — I959 Hypotension, unspecified: Secondary | ICD-10-CM | POA: Diagnosis present

## 2021-11-23 DIAGNOSIS — I4891 Unspecified atrial fibrillation: Secondary | ICD-10-CM | POA: Diagnosis present

## 2021-11-23 DIAGNOSIS — E1165 Type 2 diabetes mellitus with hyperglycemia: Secondary | ICD-10-CM | POA: Diagnosis present

## 2021-11-23 DIAGNOSIS — I2510 Atherosclerotic heart disease of native coronary artery without angina pectoris: Secondary | ICD-10-CM | POA: Diagnosis present

## 2021-11-23 DIAGNOSIS — I480 Paroxysmal atrial fibrillation: Secondary | ICD-10-CM | POA: Diagnosis present

## 2021-11-23 DIAGNOSIS — I5031 Acute diastolic (congestive) heart failure: Secondary | ICD-10-CM | POA: Diagnosis present

## 2021-11-23 DIAGNOSIS — J9601 Acute respiratory failure with hypoxia: Secondary | ICD-10-CM

## 2021-11-23 DIAGNOSIS — Z6841 Body Mass Index (BMI) 40.0 and over, adult: Secondary | ICD-10-CM | POA: Diagnosis not present

## 2021-11-23 DIAGNOSIS — U071 COVID-19: Secondary | ICD-10-CM | POA: Diagnosis present

## 2021-11-23 DIAGNOSIS — I110 Hypertensive heart disease with heart failure: Secondary | ICD-10-CM | POA: Diagnosis present

## 2021-11-23 DIAGNOSIS — Z952 Presence of prosthetic heart valve: Secondary | ICD-10-CM | POA: Diagnosis not present

## 2021-11-23 DIAGNOSIS — M160 Bilateral primary osteoarthritis of hip: Secondary | ICD-10-CM | POA: Diagnosis present

## 2021-11-23 DIAGNOSIS — R778 Other specified abnormalities of plasma proteins: Secondary | ICD-10-CM | POA: Diagnosis not present

## 2021-11-23 DIAGNOSIS — Z96643 Presence of artificial hip joint, bilateral: Secondary | ICD-10-CM | POA: Diagnosis present

## 2021-11-23 DIAGNOSIS — E669 Obesity, unspecified: Secondary | ICD-10-CM | POA: Diagnosis present

## 2021-11-23 DIAGNOSIS — I421 Obstructive hypertrophic cardiomyopathy: Secondary | ICD-10-CM | POA: Diagnosis present

## 2021-11-23 DIAGNOSIS — E041 Nontoxic single thyroid nodule: Secondary | ICD-10-CM | POA: Diagnosis present

## 2021-11-23 DIAGNOSIS — M79602 Pain in left arm: Secondary | ICD-10-CM | POA: Diagnosis present

## 2021-11-23 DIAGNOSIS — K219 Gastro-esophageal reflux disease without esophagitis: Secondary | ICD-10-CM | POA: Diagnosis present

## 2021-11-23 DIAGNOSIS — G4733 Obstructive sleep apnea (adult) (pediatric): Secondary | ICD-10-CM | POA: Diagnosis present

## 2021-11-23 DIAGNOSIS — Z961 Presence of intraocular lens: Secondary | ICD-10-CM | POA: Diagnosis present

## 2021-11-23 DIAGNOSIS — K760 Fatty (change of) liver, not elsewhere classified: Secondary | ICD-10-CM | POA: Diagnosis present

## 2021-11-23 DIAGNOSIS — I248 Other forms of acute ischemic heart disease: Secondary | ICD-10-CM | POA: Diagnosis present

## 2021-11-23 LAB — CBC WITH DIFFERENTIAL/PLATELET
Abs Immature Granulocytes: 0.01 10*3/uL (ref 0.00–0.07)
Basophils Absolute: 0 10*3/uL (ref 0.0–0.1)
Basophils Relative: 1 %
Eosinophils Absolute: 0 10*3/uL (ref 0.0–0.5)
Eosinophils Relative: 0 %
HCT: 37.7 % (ref 36.0–46.0)
Hemoglobin: 12.5 g/dL (ref 12.0–15.0)
Immature Granulocytes: 0 %
Lymphocytes Relative: 29 %
Lymphs Abs: 1.2 10*3/uL (ref 0.7–4.0)
MCH: 30.7 pg (ref 26.0–34.0)
MCHC: 33.2 g/dL (ref 30.0–36.0)
MCV: 92.6 fL (ref 80.0–100.0)
Monocytes Absolute: 0.6 10*3/uL (ref 0.1–1.0)
Monocytes Relative: 14 %
Neutro Abs: 2.4 10*3/uL (ref 1.7–7.7)
Neutrophils Relative %: 56 %
Platelets: 162 10*3/uL (ref 150–400)
RBC: 4.07 MIL/uL (ref 3.87–5.11)
RDW: 15 % (ref 11.5–15.5)
WBC: 4.2 10*3/uL (ref 4.0–10.5)
nRBC: 0 % (ref 0.0–0.2)

## 2021-11-23 LAB — COMPREHENSIVE METABOLIC PANEL
ALT: 23 U/L (ref 0–44)
AST: 42 U/L — ABNORMAL HIGH (ref 15–41)
Albumin: 2.9 g/dL — ABNORMAL LOW (ref 3.5–5.0)
Alkaline Phosphatase: 49 U/L (ref 38–126)
Anion gap: 7 (ref 5–15)
BUN: 14 mg/dL (ref 8–23)
CO2: 21 mmol/L — ABNORMAL LOW (ref 22–32)
Calcium: 7.7 mg/dL — ABNORMAL LOW (ref 8.9–10.3)
Chloride: 104 mmol/L (ref 98–111)
Creatinine, Ser: 0.99 mg/dL (ref 0.44–1.00)
GFR, Estimated: 58 mL/min — ABNORMAL LOW (ref >60)
Glucose, Bld: 163 mg/dL — ABNORMAL HIGH (ref 70–99)
Potassium: 3.8 mmol/L (ref 3.5–5.1)
Sodium: 132 mmol/L — ABNORMAL LOW (ref 135–145)
Total Bilirubin: 0.8 mg/dL (ref 0.3–1.2)
Total Protein: 5.5 g/dL — ABNORMAL LOW (ref 6.5–8.1)

## 2021-11-23 LAB — TROPONIN I (HIGH SENSITIVITY)
Troponin I (High Sensitivity): 131 ng/L (ref <18)
Troponin I (High Sensitivity): 2478 ng/L (ref <18)
Troponin I (High Sensitivity): 2985 ng/L (ref <18)

## 2021-11-23 LAB — LACTIC ACID, PLASMA
Lactic Acid, Venous: 1.2 mmol/L (ref 0.5–1.9)
Lactic Acid, Venous: 1.3 mmol/L (ref 0.5–1.9)

## 2021-11-23 LAB — D-DIMER, QUANTITATIVE: D-Dimer, Quant: 0.6 ug/mL-FEU — ABNORMAL HIGH (ref 0.00–0.50)

## 2021-11-23 LAB — RESP PANEL BY RT-PCR (FLU A&B, COVID) ARPGX2
Influenza A by PCR: NEGATIVE
Influenza B by PCR: NEGATIVE
SARS Coronavirus 2 by RT PCR: POSITIVE — AB

## 2021-11-23 LAB — C-REACTIVE PROTEIN: CRP: 1.3 mg/dL — ABNORMAL HIGH (ref <1.0)

## 2021-11-23 LAB — TSH: TSH: 1.711 u[IU]/mL (ref 0.350–4.500)

## 2021-11-23 MED ORDER — METHYLPREDNISOLONE SODIUM SUCC 125 MG IJ SOLR
80.0000 mg | INTRAMUSCULAR | Status: DC
  Filled 2021-11-23: qty 2

## 2021-11-23 MED ORDER — HEPARIN (PORCINE) 25000 UT/250ML-% IV SOLN
1100.0000 [IU]/h | INTRAVENOUS | Status: DC
  Administered 2021-11-23: 14:00:00 1400 [IU]/h via INTRAVENOUS
  Administered 2021-11-24: 03:00:00 1100 [IU]/h via INTRAVENOUS
  Filled 2021-11-23 (×2): qty 250

## 2021-11-23 MED ORDER — SODIUM CHLORIDE 0.9 % IV BOLUS: 500.0000 mL | Freq: Once | INTRAVENOUS | Status: DC

## 2021-11-23 MED ORDER — PANTOPRAZOLE SODIUM 40 MG PO TBEC
40.0000 mg | DELAYED_RELEASE_TABLET | Freq: Every day | ORAL | Status: DC
  Administered 2021-11-23 – 2021-11-24 (×2): 40 mg via ORAL
  Filled 2021-11-23 (×2): qty 1

## 2021-11-23 MED ORDER — ROSUVASTATIN CALCIUM 20 MG PO TABS
20.0000 mg | ORAL_TABLET | Freq: Every day | ORAL | Status: DC
  Administered 2021-11-23 – 2021-11-28 (×6): 20 mg via ORAL
  Filled 2021-11-23 (×6): qty 1

## 2021-11-23 MED ORDER — ACETAMINOPHEN 650 MG RE SUPP: 650.0000 mg | Freq: Four times a day (QID) | RECTAL | Status: DC | PRN

## 2021-11-23 MED ORDER — SODIUM CHLORIDE 0.9 % IV SOLN
200.0000 mg | Freq: Once | INTRAVENOUS | Status: AC
  Administered 2021-11-23: 02:00:00 200 mg via INTRAVENOUS
  Filled 2021-11-23: qty 40

## 2021-11-23 MED ORDER — EZETIMIBE 10 MG PO TABS
10.0000 mg | ORAL_TABLET | Freq: Every day | ORAL | Status: DC
  Administered 2021-11-24: 09:00:00 10 mg via ORAL
  Filled 2021-11-23 (×2): qty 1

## 2021-11-23 MED ORDER — AMIODARONE HCL IN DEXTROSE 360-4.14 MG/200ML-% IV SOLN
60.0000 mg/h | INTRAVENOUS | Status: AC
  Administered 2021-11-23 (×2): 60 mg/h via INTRAVENOUS
  Filled 2021-11-23: qty 200

## 2021-11-23 MED ORDER — ACETAMINOPHEN 325 MG PO TABS
650.0000 mg | ORAL_TABLET | Freq: Four times a day (QID) | ORAL | Status: DC | PRN
  Administered 2021-11-23 – 2021-11-27 (×5): 650 mg via ORAL
  Filled 2021-11-23 (×5): qty 2

## 2021-11-23 MED ORDER — ENOXAPARIN SODIUM 60 MG/0.6ML IJ SOSY
60.0000 mg | PREFILLED_SYRINGE | INTRAMUSCULAR | Status: DC
  Filled 2021-11-23: qty 0.6

## 2021-11-23 MED ORDER — SODIUM CHLORIDE 0.9 % IV BOLUS
500.0000 mL | Freq: Once | INTRAVENOUS | Status: AC
  Administered 2021-11-23: 500 mL via INTRAVENOUS

## 2021-11-23 MED ORDER — AMIODARONE HCL IN DEXTROSE 360-4.14 MG/200ML-% IV SOLN
30.0000 mg/h | INTRAVENOUS | Status: DC
  Administered 2021-11-24 – 2021-11-25 (×3): 30 mg/h via INTRAVENOUS
  Filled 2021-11-23 (×4): qty 200

## 2021-11-23 MED ORDER — AMIODARONE LOAD VIA INFUSION
150.0000 mg | Freq: Once | INTRAVENOUS | Status: AC
  Administered 2021-11-23: 13:00:00 150 mg via INTRAVENOUS
  Filled 2021-11-23: qty 83.34

## 2021-11-23 MED ORDER — HEPARIN BOLUS VIA INFUSION
5000.0000 [IU] | Freq: Once | INTRAVENOUS | Status: AC
  Administered 2021-11-23: 14:00:00 5000 [IU] via INTRAVENOUS
  Filled 2021-11-23: qty 5000

## 2021-11-23 MED ORDER — SODIUM CHLORIDE 0.9 % IV SOLN
100.0000 mg | Freq: Every day | INTRAVENOUS | Status: DC
  Administered 2021-11-24 – 2021-11-25 (×2): 100 mg via INTRAVENOUS
  Filled 2021-11-23 (×3): qty 20

## 2021-11-23 MED ORDER — ASPIRIN EC 81 MG PO TBEC
81.0000 mg | DELAYED_RELEASE_TABLET | Freq: Every day | ORAL | Status: DC
  Administered 2021-11-23 – 2021-11-24 (×2): 81 mg via ORAL
  Filled 2021-11-23 (×2): qty 1

## 2021-11-23 MED ORDER — CALCIUM GLUCONATE-NACL 1-0.675 GM/50ML-% IV SOLN
1.0000 g | Freq: Once | INTRAVENOUS | Status: AC
  Administered 2021-11-23: 01:00:00 1000 mg via INTRAVENOUS
  Filled 2021-11-23: qty 50

## 2021-11-23 NOTE — ED Notes (Signed)
Pt's BP jumping from SBP 70s to 150s. This RN will check manual BP d/t inconsistent BP readings.

## 2021-11-23 NOTE — ED Notes (Signed)
Report attempted 

## 2021-11-23 NOTE — Progress Notes (Signed)
PROGRESS NOTE  Brief Narrative: Victoria Delgado is a 80 y.o. female with a history of PAF, GI bleed on eliquis, s/p TAVR, CAD s/p PCI who presented to the ED 12/21 with palpitations and chest discomfort. She had developed URI symptoms with cough on 12/19 after dining with her daughter and son-in-law (who developed symptoms hours afterward) on 12/16. She was found to be hypoxemic, improved on 2L O2, wheezing, with chronic-appearing increased lung markings and bibasilar opacity favored to be atelectasis on CXR. +SARS-CoV-2 PCR, CRP 1.3. She was in AFib with RVR for which diltiazem infusion was started but subsequently stopped due to hypotension. Cardiology was consulted for assistance with management and for troponin elevation. IV heparin and amiodarone have been initiated.  Subjective: Breathing is stable, short of breath severely with any exertion, even bed level. No chest pain. Left arm pain was present initially, now improved.   Objective: BP 117/70    Pulse (!) 138    Temp 98.2 F (36.8 C) (Oral)    Resp (!) 23    Ht 5\' 8"  (1.727 m)    Wt 127 kg    SpO2 95%    BMI 42.57 kg/m   Gen: Nontoxic female in no distress Pulm: Crackles at bases, nonlabored with 2L O2.   CV: Rapid irreg, no murmur, no JVD, no edema GI: Soft, NT, ND, +BS  Neuro: Alert and oriented. No focal deficits. Skin: No rashes, lesions or ulcers on visualized skin.  Assessment & Plan: Principal Problem:   Acute hypoxemic respiratory failure due to COVID-19 [facility]) Active Problems:   HOCM (hypertrophic obstructive cardiomyopathy) (HCC)   Sleep apnea   Coronary artery disease involving native coronary artery of native heart without angina pectoris   Atrial fibrillation with rapid ventricular response (HCC)   ARF (acute renal failure) (HCC)   Acute respiratory failure with hypoxemia (HCC)  Acute hypoxemic respiratory failure due to covid-19 infection: No definite airspace opacities. Exposure 12/16, symptoms 12/19, presented  12/21. s/p Pfizer vaccine x2 March 2021. - Wean oxygen as tolerated. - Continue remdesivir x5 days currently planned (12/21 >> )  - Indication for steroids includes the hypoxemia and would potentially benefit her wheezing, though she declines. We agreed not to push the issue at this time.  - D-dimer and CRP only modestly elevated. Will trend.  - Therapeutic anticoagulation as below. - Isolation x10 days is recommended. Broke the news to pt/spouse that this would practically preclude her from the gathering of >30 people in her family this year.   PAF with RVR: Precipitated most likely by viral illness, though with severity of troponin elevation and her symptom of left arm pain (the only symptom she had with prior ACS requiring PCI), cardiac ischemia is either causative or resultant from RVR as well.  - Amiodarone infusion started. If BP improves, could trial home metoprolol. - Discussed risk of stroke (CHA2DS2-VASc score is 5 [age x2, sex, CAD, HTN]) with patient as well as thrombogenicity known to occur from covid-19 as well as troponin elevation. I recommended therapeutic anticoagulation and it appears cardiology agrees. IV heparin is started. Will monitor for GI bleeding, trend CBC.    NSTEMI is pt w/CAD s/p PCI to LCx and diagonal, patent at [facility] Aug 2021.  - IV heparin - ASA, statin. Restart beta blocker when able.  - Check limited echo for wall motion, LVEF.  History of AS s/p TAVR: at [facility] Oct 2021 (23 mm Edwards Sapien S3 Ultra) last echo there 11/03/21 with stable  valve, mean gradient stable at 17 mmHg.  History of GI bleed: Not on DOAC for this reason.  - Will monitor closely with anticoagulation as above.   Thyroid nodule: Worked up per endocrinology as outpatient. TSH is wnl at 1.711.   OSA:  - CPAP qHS, would prefer negative pressure room.  Tyrone Nine, MD Pager on amion 11/23/2021, 3:05 PM

## 2021-11-23 NOTE — Progress Notes (Signed)
ANTICOAGULATION CONSULT NOTE - Initial Consult  Pharmacy Consult for heparin Indication: atrial fibrillation  Allergies  Allergen Reactions   Codeine Nausea Only   Kenalog [Triamcinolone Acetonide] Other (See Comments)    Had A.fib after she received the injection."Steroid meds"   Relafen [Nabumetone] Other (See Comments)    Edema    Epinephrine Palpitations   Penicillins Rash    40 years ago, injection site was red and swollen.  rash, facial/tongue/throat swelling, SOB or lightheadedness with hypotension: Yes Has patient had a PCN reaction causing immediate  Has patient had a PCN reaction causing severe rash involving mucus membranes or skin necrosis: NO Has patient had a PCN reaction that required hospitalization. No  Has patient had a PCN reaction occurring within the last 10 years: No If all of the above answers are "NO", then may proceed with Cephalosporin use.     Tramadol Other (See Comments)    Auditory halllucinations    Patient Measurements: Height: 5\' 8"  (172.7 cm) Weight: 127 kg (279 lb 15.8 oz) IBW/kg (Calculated) : 63.9 Heparin Dosing Weight: 94 kg  Vital Signs: Temp: 98.2 F (36.8 C) (12/22 0740) Temp Source: Oral (12/22 0740) BP: 101/76 (12/22 1300) Pulse Rate: 146 (12/22 1300)  Labs: Recent Labs    11/22/21 2229 11/23/21 0019 11/23/21 0606 11/23/21 0746  HGB 13.5  --  12.5  --   HCT 41.5  --  37.7  --   PLT 180  --  162  --   CREATININE 1.04*  --  0.99  --   TROPONINIHS 53* 131* 2,478* 2,985*    Estimated Creatinine Clearance: 65.9 mL/min (by C-G formula based on SCr of 0.99 mg/dL).   Medical History: Past Medical History:  Diagnosis Date   Abdominal pain    Arthritis    osteoarthritis-hips,knees, back, hands   Asymmetric septal hypertrophy (HCC)    Atrial fibrillation (HCC)    CAD (coronary artery disease)    2 drug-eluting stents placed in the circumflex leading into a marginal.  May 2017.  June 2017.   catheterization on  03/19/2018, this showed patent stent in the proximal to mid left circumflex artery, 20% ostial left circumflex artery disease, 40% OM 3 disease, widely patent stent in the ostial D1, 20% ostial to proximal LAD disease, 20% mid LAD disease.   Cataract    corrective surgery done   Fatty liver    Gallstones    GERD (gastroesophageal reflux disease)    GERD (gastroesophageal reflux disease)    Heart palpitations    Hepatitis    hepatitis A; past hx.,many yrs ago ? food source   Hernia, hiatal    Hyperlipidemia    Hypertension    Obesity    Sleep apnea    CPAP    Medications:  (Not in a hospital admission)   Assessment: 4 YOF with hx of CAD post stenting, paroxysmal afib, sleep apnea, GI bleed while on Eliquis, presents to the ER for palpitations. Pharmacy has been consulted for heparin dosing for atrial fibrillation. Patient is not on anticoagulation.   Goal of Therapy:  Heparin level 0.3-0.7 units/ml Monitor platelets by anticoagulation protocol: Yes   Plan:  Heparin 5000 units IV x 1, then Heparin 1400 units/hr  Follow-up heparin level in 8 hrs. Monitor daily heparin level, CBC. Monitor for s/sx of bleeding.   Hughey Rittenberry 11/23/2021,1:13 PM

## 2021-11-23 NOTE — ED Provider Notes (Signed)
I see the patient in signout from Dr. Stevie Kern.  Patient is a 80 year old female with a chief complaints of palpitations.  Found to be in A. fib with RVR.  Unsure timeline of exact onset since 2 did not feel safe cardioverting.  Patient with persistent tachycardia.  Given a bolus of diltiazem with a drop in her blood pressure.  Given a bolus of IV fluid with transient improvement.  Patient not lightheaded or dizzy.  Will give another bolus of IV fluids.  Bolus of calcium.  Patient's COVID test is resulted and is positive.  She tells me that she has been sick for about 3 or 4 days now.  Cough congestion fever not eating or drinking as much.  Patient's troponin is somewhat upward trending.  I discussed this with the cardiology fellow, Dr. Noemi Chapel.  She felt it was likely due to the patient's rate as well as the patient's current respiratory illness.  Recommended treating COVID and would recommend increasing her home metoprolol to 50 a day.  Felt likely A. fib would be difficult to control with ongoing upper respiratory illness.  Will discuss with medicine for admission.  CRITICAL CARE Performed by: Rae Roam   Total critical care time: 35 minutes  Critical care time was exclusive of separately billable procedures and treating other patients.  Critical care was necessary to treat or prevent imminent or life-threatening deterioration.  Critical care was time spent personally by me on the following activities: development of treatment plan with patient and/or surrogate as well as nursing, discussions with consultants, evaluation of patient's response to treatment, examination of patient, obtaining history from patient or surrogate, ordering and performing treatments and interventions, ordering and review of laboratory studies, ordering and review of radiographic studies, pulse oximetry and re-evaluation of patient's condition.  The patients results and plan were reviewed and discussed.    Any x-rays performed were independently reviewed by myself.   Differential diagnosis were considered with the presenting HPI.  Medications  diltiazem (CARDIZEM) 125 mg in dextrose 5% 125 mL (1 mg/mL) infusion (5 mg/hr Intravenous Restarted 11/23/21 0018)  calcium gluconate 1 g/ 50 mL sodium chloride IVPB (1,000 mg Intravenous New Bag/Given 11/23/21 0101)  remdesivir 200 mg in sodium chloride 0.9% 250 mL IVPB (has no administration in time range)    Followed by  remdesivir 100 mg in sodium chloride 0.9 % 100 mL IVPB (has no administration in time range)  diltiazem (CARDIZEM) 1 mg/mL load via infusion 10 mg (10 mg Intravenous Bolus from Bag 11/22/21 2240)  acetaminophen (TYLENOL) tablet 1,000 mg (1,000 mg Oral Given 11/22/21 2304)  sodium chloride 0.9 % bolus 500 mL (0 mLs Intravenous Stopped 11/22/21 2341)  sodium chloride 0.9 % bolus 500 mL (500 mLs Intravenous New Bag/Given 11/23/21 0028)    Vitals:   11/23/21 0017 11/23/21 0026 11/23/21 0030 11/23/21 0115  BP: (!) 73/60 (!) 82/62 (!) 83/72 (!) 96/55  Pulse: (!) 126  (!) 131 (!) 117  Resp: (!) 24  (!) 27 (!) 21  Temp:      SpO2: 94%  95% 95%    Final diagnoses:  COVID-19 virus infection  Acute respiratory failure with hypoxia (HCC)  Atrial fibrillation with rapid ventricular response (HCC)    Admission/ observation were discussed with the admitting physician, patient and/or family and they are comfortable with the plan.     Melene Plan, DO 11/23/21 [phone]

## 2021-11-23 NOTE — ED Notes (Signed)
Per MD Adela Lank we will continue pt on cardizem drip with fluids running d/t pt being asymptomatic with low BP.

## 2021-11-23 NOTE — ED Notes (Signed)
Pausing the cardizem drip per hospitalists

## 2021-11-23 NOTE — H&P (Addendum)
History and Physical    Victoria Delgado HYH:[id number] DOB: [birth year] DOA: 11/22/2021  PCP: Charlane Ferretti, DO  Patient coming from: Home.  Chief Complaint: Palpitations.  HPI: Victoria Delgado is a 80 y.o. female with history of CAD status post stenting, paroxysmal atrial fibrillation, sleep apnea, GI bleed while on Eliquis, status post TAVR presents to the ER because of palpitation.  Patient's symptoms started last evening.  2 days before patient had symptoms of flu with body aches cough and shortness of breath.  Denies missing medications.  ED Course: In the ER patient is found to be in A. fib with RVR and also found to be wheezing.  Chest x-ray shows chronic infiltrates.  Patient came back positive for COVID.  Patient is at this time on 2 L oxygen requiring to maintain sats.  Patient was started on Cardizem infusion follow which patient blood pressure dropped cardiology recommended to keep off the Cardizem for now.  Labs also show creatinine of 1.04 which is increased from normal about 5 months ago.  High sensitive troponin increased from 53 and it is around 131.  Patient was started on remdesivir for COVID.  Patient also received fluid bolus for hypotension which improved with the bolus.  Review of Systems: As per HPI, rest all negative.   Past Medical History:  Diagnosis Date   Abdominal pain    Arthritis    osteoarthritis-hips,knees, back, hands   Asymmetric septal hypertrophy (HCC)    Atrial fibrillation (HCC)    CAD (coronary artery disease)    2 drug-eluting stents placed in the circumflex leading into a marginal.  May 2017.  [name].   catheterization on 03/19/2018, this showed patent stent in the proximal to mid left circumflex artery, 20% ostial left circumflex artery disease, 40% OM 3 disease, widely patent stent in the ostial D1, 20% ostial to proximal LAD disease, 20% mid LAD disease.   Cataract    corrective surgery done   Fatty liver    Gallstones    GERD  (gastroesophageal reflux disease)    GERD (gastroesophageal reflux disease)    Heart palpitations    Hepatitis    hepatitis A; past hx.,many yrs ago ? food source   Hernia, hiatal    Hyperlipidemia    Hypertension    Obesity    Sleep apnea    CPAP    Past Surgical History:  Procedure Laterality Date   APPENDECTOMY  2004   CATARACT EXTRACTION W/ INTRAOCULAR LENS IMPLANT     both eyes   CHOLECYSTECTOMY     LEFT HEART CATH AND CORONARY ANGIOGRAPHY N/A 03/19/2018   Procedure: LEFT HEART CATH AND CORONARY ANGIOGRAPHY;  Surgeon: Kathleene Hazel, MD;  Location: [facility] INVASIVE CV LAB;  Service: Cardiovascular;  Laterality: N/A;   NM MYOCAR PERF WALL MOTION  08/15/04   No ischemia   RETINAL DETACHMENT SURGERY Bilateral    remains with slight hazy vision with lights   RIGHT/LEFT HEART CATH AND CORONARY ANGIOGRAPHY N/A 07/11/2020   Procedure: RIGHT/LEFT HEART CATH AND CORONARY ANGIOGRAPHY;  Surgeon: Tonny Bollman, MD;  Location: [facility] INVASIVE CV LAB;  Service: Cardiovascular;  Laterality: N/A;   TOTAL HIP ARTHROPLASTY Right 08/04/2014   Procedure: RIGHT TOTAL HIP ARTHROPLASTY ANTERIOR APPROACH;  Surgeon: Loanne Drilling, MD;  Location: [facility] ORS;  Service: Orthopedics;  Laterality: Right;   TOTAL HIP ARTHROPLASTY Left 01/05/2015   Procedure: LEFT TOTAL HIP ARTHROPLASTY ANTERIOR APPROACH;  Surgeon: Loanne Drilling, MD;  Location: [facility] OR[facility];  Service: Orthopedics;  Laterality: Left;   US ECHOCARDIOGRAPHY  06/25/2012   Moderate ASH,LV hyperdynamic,LA is mod. dilated,trace MR,TR,AI     reports that she has quit smoking. She has never used smokeless tobacco. She reports that she does not drink alcohol and does not use drugs.  Allergies  Allergen Reactions   Codeine Nausea Only   Kenalog [Triamcinolone Acetonide] Other (See Comments)    Had A.fib after she received the injection."Steroid meds"   Relafen [Nabumetone] Other (See Comments)    Edema    Epinephrine Palpitations   Penicillins Rash     40 years ago, injection site was red and swollen.  rash, facial/tongue/throat swelling, SOB or lightheadedness with hypotension: Yes Has patient had a PCN reaction causing immediate  Has patient had a PCN reaction causing severe rash involving mucus membranes or skin necrosis: NO Has patient had a PCN reaction that required hospitalization. No  Has patient had a PCN reaction occurring within the last 10 years: No If all of the above answers are "NO", then may proceed with Cephalosporin use.     Tramadol Other (See Comments)    Auditory halllucinations    Family History  Problem Relation Age of Onset   Hypertension Mother    CVA Mother    Lung cancer Father 62       asbestos exposure   Cancer Father    Esophageal cancer Other        nephew   Diabetes Other        nephew   Colon cancer Neg Hx    Pancreatic cancer Neg Hx    Kidney disease Neg Hx    Liver disease Neg Hx     Prior to Admission medications   Medication Sig Start Date End Date Taking? Authorizing Provider  acetaminophen (TYLENOL) 500 MG tablet Take 1,000 mg by mouth every 6 (six) hours as needed for moderate pain or headache.   Yes [provider]  albuterol (VENTOLIN HFA) 108 (90 Base) MCG/ACT inhaler Inhale 2 puffs into the lungs every 4 (four) hours as needed for wheezing or shortness of breath.  12/21/19  Yes [provider]  alum & mag hydroxide-simeth (MAALOX/MYLANTA) 200-200-20 MG/5ML suspension Take 30 mLs by mouth every 6 (six) hours as needed for indigestion or heartburn.   Yes [provider]  aspirin EC 81 MG tablet Take 81 mg by mouth daily. Swallow whole.   Yes [provider]  cholecalciferol (VITAMIN D3) 25 MCG (1000 UNIT) tablet Take 1,000 Units by mouth 2 (two) times daily.   Yes [provider]  CINNAMON PO Take 1 capsule by mouth in the morning and at bedtime.   Yes [provider]  CRESTOR 20 MG tablet TAKE 1 TABLET(20 MG) BY MOUTH DAILY Patient  taking differently: Take 20 mg by mouth daily. 09/19/21  Yes Minus Breeding, MD  furosemide (LASIX) 40 MG tablet Take 1 tablet (40 mg total) by mouth daily. 06/22/21  Yes Minus Breeding, MD  loteprednol (LOTEMAX) 0.5 % ophthalmic suspension Place 1 drop into both eyes daily as needed (imflammation).    Yes [provider]  metoprolol succinate (TOPROL XL) 50 MG 24 hr tablet TAKE 1 TAKE BY MOUTH EVERY MORNING AND 1/2 TABLET EVERY EVENING Patient taking differently: Take 25-50 mg by mouth See admin instructions. 50 mg in the morning 25 mg in the evening 06/30/21  Yes Hochrein, Jeneen Rinks, MD  Multiple Vitamin (MULTIVITAMIN) tablet Take 1 tablet by mouth daily.   Yes  [provider]  Multiple Vitamins-Minerals (ZINC PO) Take 1 tablet by mouth daily.   Yes [provider]  nitroGLYCERIN (NITROSTAT) 0.4 MG SL tablet Place 1 tablet (0.4 mg total) under the tongue every 5 (five) minutes as needed for chest pain. 05/07/16  Yes Burtis Junes, NP  omeprazole (PRILOSEC) 10 MG capsule Take 10 mg by mouth daily.   Yes [provider]  ezetimibe (ZETIA) 10 MG tablet Take 1 tablet (10 mg total) by mouth daily. Patient not taking: Reported on 11/22/2021 03/31/21 08/08/21  Minus Breeding, MD  umeclidinium-vilanterol [facility] ELLIPTA) 62.5-25 MCG/ACT AEPB Inhale 1 puff into the lungs daily.    [provider]    Physical Exam: Constitutional: Moderately built and nourished. Vitals:   11/23/21 0350 11/23/21 0400 11/23/21 0405 11/23/21 0445  BP: 92/60 93/60 (!) 86/69 (!) 80/57  Pulse: (!) 124 (!) 101 (!) 115 (!) 114  Resp: (!) 23 19 (!) 21 19  Temp:      SpO2: 96% 95% 96% 96%   Eyes: Anicteric no pallor. ENMT: No discharge from the ears eyes nose and mouth. Neck: No JVD appreciated. Respiratory: Bilateral expiratory wheeze and no crepitations. Cardiovascular: S1-S2 heard. Abdomen: Soft nontender bowel sound present. Musculoskeletal: No edema. Skin: No  rash. Neurologic: Alert awake oriented to time place and person.  Moves all extremities. Psychiatric: Appears normal.  Normal affect.   Labs on Admission: I have personally reviewed following labs and imaging studies  CBC: Recent Labs  Lab 11/22/21 2229  WBC 5.4  NEUTROABS 3.3  HGB 13.5  HCT 41.5  MCV 92.0  PLT 99991111   Basic Metabolic Panel: Recent Labs  Lab 11/22/21 2229  NA 131*  K 3.7  CL 101  CO2 21*  GLUCOSE 153*  BUN 14  CREATININE 1.04*  CALCIUM 7.9*  MG 1.9   GFR: Estimated Creatinine Clearance: 62.7 mL/min (A) (by C-G formula based on SCr of 1.04 mg/dL (H)). Liver Function Tests: No results for input(s): AST, ALT, ALKPHOS, BILITOT, PROT, ALBUMIN in the last 168 hours. No results for input(s): LIPASE, AMYLASE in the last 168 hours. No results for input(s): AMMONIA in the last 168 hours. Coagulation Profile: No results for input(s): INR, PROTIME in the last 168 hours. Cardiac Enzymes: No results for input(s): CKTOTAL, CKMB, CKMBINDEX, TROPONINI in the last 168 hours. BNP (last 3 results) No results for input(s): PROBNP in the last 8760 hours. HbA1C: No results for input(s): HGBA1C in the last 72 hours. CBG: No results for input(s): GLUCAP in the last 168 hours. Lipid Profile: No results for input(s): CHOL, HDL, LDLCALC, TRIG, CHOLHDL, LDLDIRECT in the last 72 hours. Thyroid Function Tests: No results for input(s): TSH, T4TOTAL, FREET4, T3FREE, THYROIDAB in the last 72 hours. Anemia Panel: No results for input(s): VITAMINB12, FOLATE, FERRITIN, TIBC, IRON, RETICCTPCT in the last 72 hours. Urine analysis:    Component Value Date/Time   COLORURINE YELLOW 12/18/2020 1202   APPEARANCEUR CLEAR 12/18/2020 1202   LABSPEC 1.010 12/18/2020 1202   PHURINE 6.5 12/18/2020 1202   GLUCOSEU NEGATIVE 12/18/2020 1202   HGBUR NEGATIVE 12/18/2020 1202   BILIRUBINUR NEGATIVE 12/18/2020 1202   BILIRUBINUR neg 01/25/2015 0920   KETONESUR NEGATIVE 12/18/2020 1202    PROTEINUR NEGATIVE 12/18/2020 1202   UROBILINOGEN negative 01/25/2015 0920   UROBILINOGEN 1.0 12/27/2014 0850   NITRITE NEGATIVE 12/18/2020 1202   LEUKOCYTESUR NEGATIVE 12/18/2020 1202   Sepsis Labs: @LABRCNTIP (procalcitonin:4,lacticidven:4) ) Recent Results (from the past 240 hour(s))  Resp Panel by RT-PCR (  Flu A&B, Covid) Nasopharyngeal Swab     Status: Abnormal   Collection Time: 11/22/21 10:25 PM   Specimen: Nasopharyngeal Swab; Nasopharyngeal(NP) swabs in vial transport medium  Result Value Ref Range Status   SARS Coronavirus 2 by RT PCR POSITIVE (A) NEGATIVE Final    Comment: (NOTE) SARS-CoV-2 target nucleic acids are DETECTED.  The SARS-CoV-2 RNA is generally detectable in upper respiratory specimens during the acute phase of infection. Positive results are indicative of the presence of the identified virus, but do not rule out bacterial infection or co-infection with other pathogens not detected by the test. Clinical correlation with patient history and other diagnostic information is necessary to determine patient infection status. The expected result is Negative.  Fact Sheet for Patients: EntrepreneurPulse.com.au  Fact Sheet for Healthcare Providers: IncredibleEmployment.be  This test is not yet approved or cleared by the [place] FDA and  has been authorized for detection and/or diagnosis of SARS-CoV-2 by FDA under an Emergency Use Authorization (EUA).  This EUA will remain in effect (meaning this test can be used) for the duration of  the COVID-19 declaration under Section 564(b)(1) of the A ct, 21 U.S.C. section 360bbb-3(b)(1), unless the authorization is terminated or revoked sooner.     Influenza A by PCR NEGATIVE NEGATIVE Final   Influenza B by PCR NEGATIVE NEGATIVE Final    Comment: (NOTE) The Xpert Xpress SARS-CoV-2/FLU/RSV plus assay is intended as an aid in the diagnosis of influenza from Nasopharyngeal swab  specimens and should not be used as a sole basis for treatment. Nasal washings and aspirates are unacceptable for Xpert Xpress SARS-CoV-2/FLU/RSV testing.  Fact Sheet for Patients: EntrepreneurPulse.com.au  Fact Sheet for Healthcare Providers: IncredibleEmployment.be  This test is not yet approved or cleared by the [place] FDA and has been authorized for detection and/or diagnosis of SARS-CoV-2 by FDA under an Emergency Use Authorization (EUA). This EUA will remain in effect (meaning this test can be used) for the duration of the COVID-19 declaration under Section 564(b)(1) of the Act, 21 U.S.C. section 360bbb-3(b)(1), unless the authorization is terminated or revoked.  Performed at [facility], [place] [address], [place],  [address]      Radiological Exams on Admission: DG Chest Port 1 View  Result Date: 11/23/2021 CLINICAL DATA:  Chest pain. EXAM: PORTABLE CHEST 1 VIEW COMPARISON:  June 09, 2019 FINDINGS: Mild, chronic appearing increased lung markings are seen with mild bibasilar atelectasis. There is no evidence of a pleural effusion or pneumothorax. The cardiac silhouette is enlarged and unchanged in size. An artificial right aortic valve is seen. There is marked severity calcification of the aortic arch. Degenerative changes are seen throughout the thoracic spine. IMPRESSION: Chronic appearing increased lung markings with mild bibasilar atelectasis. Electronically Signed   By: Virgina Norfolk M.D.   On: 11/23/2021 00:41    EKG: Independently reviewed.  A. fib with RVR.  Assessment/Plan Principal Problem:   Acute hypoxemic respiratory failure due to COVID-19 [facility]) Active Problems:   HOCM (hypertrophic obstructive cardiomyopathy) (HCC)   Sleep apnea   Coronary artery disease involving native coronary artery of native heart without angina pectoris   Atrial fibrillation with rapid ventricular response ([place])   ARF (acute  renal failure) (HCC)   Acute respiratory failure with hypoxemia (HCC)    Acute respiratory failure with hypoxia secondary to COVID infection presently on 2 L oxygen to maintain sats more than 90%.  On exam patient has diffuse wheezing.  Chest x-ray does show some  chronic infiltrates.  Given the patient's symptoms of shortness of breath and has diffuse wheezing on exam with chest x-ray showing infiltrates patient will be treated for COVID infection with remdesivir and steroids.  Follow inflammatory markers which are pending.  Closely follow respiratory status. A. fib with RVR could be likely precipitated from viral infection -patient blood pressure dropped after Cardizem was started.  ER physician did discuss with on-call cardiologist who advised holding of Cardizem.  At this time patient's heart rate at the time of my exam is 110 bpm blood pressure is in the 96 systolic.  Once blood pressures are more stable we will try to start patient's home dose of Toprol.  Check TSH.  Patient not on any anticoagulants secondary to GI bleed while on Eliquis. Status post TAVR History of CAD status post stenting denies any chest pain.  Troponin is mildly elevated which we will trend to make sure it is not further worsening.  Likely from heart rate related.  Continue aspirin Zetia.  Metoprolol when blood pressure improves. History of LVH.  2D echo done this month at [facility] showed normal EF.  Patient does take Lasix.  Holding now due to low normal blood pressure.  Check BNP. Sleep apnea uses CPAP at bedtime. Acute renal failure likely from hypotension and poor oral intake from COVID.  Patient did receive fluid bolus in the ER.  Hold Lasix for now.  Follow metabolic panel.   Since patient has acute respiratory failure with hypoxia presently on 2 L oxygen from COVID infection and also A. fib with RVR will need close monitoring and inpatient status.  Addendum -patient refused Solu-Medrol or any  steroids.  Patient states that she had reaction to it before.   DVT prophylaxis: Lovenox. Code Status: Full code. Family Communication: Patient's husband at the bedside. Disposition Plan: Home. Consults called: ER physician discussed with cardiologist. Admission status: Inpatient.   Eduard Clos MD [facility] Pager [phone].  If 7PM-7AM, please contact night-coverage [email or web address] Password [facility]  11/23/2021, 5:34 AM

## 2021-11-23 NOTE — ED Notes (Signed)
Diet was ordered for Breakfast. 

## 2021-11-23 NOTE — ED Notes (Signed)
Pt used BSC without difficulty. [name] at bedside.

## 2021-11-23 NOTE — Progress Notes (Signed)
Due to high heart rate, accurate echo cannot be completed at this time.

## 2021-11-23 NOTE — Consult Note (Addendum)
Cardiology Consultation:   Patient ID: Victoria Delgado MRN: [id number]; DOB: [birth year]  Admit date: 11/22/2021 Date of Consult: 11/23/2021  PCP:  Victoria Delgado, [place], DO   [facility] Providers Cardiologist:  Victoria RotundaJames Lorie Cleckley, MD        Patient Profile:   Victoria Delgado is a 80 y.o. 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 Jarvis Delgado.  History of Present Illness:   Ms. Victoria Delgado with hx as above and CAD and NSTEMIs with prior stents in 1st diag and mLCX, last cath 07/11/20 done prior to TAVR with patent stents.  She did have crtical AS at that time with mean gradient of 79 mmHg and calculated valve area of 0.7 cm, also mod pulmonary hypertension. Otherwise non critical CAD.      [facility]  (23 mm Edwards Sapien S3 Ultra) last echo there 11/03/21 with stable valve.  Her mean gradient is 17 mmHg which is stable from prior.  [facility].  She was in SR at that time.    She has hx of GI bleed on Eliquis. Hx of PAF she thought was result of steroid injection.  Her CHA2DS2VASc score of 5.    Presented to ER last evening with chest pain for 1 hour, she thought her atrial fib was starting back up.  HR was 140 by EMS.    In ER given Cardizem bout with drop in BP to 70s systolic and then given fluid bolus.  Also noted she had been sick for 3-4 days prior to presentation.  Cough, congestion and fever.  She had decreased appetite as well.  Found to be COVID +.  After 1500 cc fluid bolus pt started on remdesivir. Placed on 02 2L to keep sp02 >90%.  She was placed on steroids (pt refused steroids- hx of reaction in past) as well.   Once HR was slowed pt's chest pain resolved.    EKG:  The EKG was personally reviewed and demonstrates:  atrial fib at 135 RVR with LAFB and mild ST depression.   Telemetry:  Telemetry was  personally reviewed and demonstrates:  atrial fib rates in 130s.  BP soft  Hs Troponin 53>> 131>> 2,478>> 2,985  Na 132 K+ 3.8, Cr 0.99 alb 2.9, AST 42, ALT 23 Wbc 4.2, Hgb 12.5 plts 162 Ddimer 0.60 TSH 1.711  PCXR  IMPRESSION: Chronic appearing increased lung markings with mild bibasilar atelectasis.  BP 88/68 P 137 R 24 to 36 sp02 2 L at 96%   Past Medical History:  Diagnosis Date   Abdominal pain    Arthritis    osteoarthritis-hips,knees, back, hands   Asymmetric septal hypertrophy (HCC)    Atrial fibrillation (HCC)    CAD (coronary artery disease)    2 drug-eluting stents placed in the circumflex leading into a marginal.  May 2017.  Despina HickGrand Delgado.   catheterization on 03/19/2018, this showed patent stent in the proximal to mid left circumflex artery, 20% ostial left circumflex artery disease, 40% OM 3 disease, widely patent stent in the ostial D1, 20% ostial to proximal LAD disease, 20% mid LAD disease.   Cataract    corrective surgery done   Fatty liver    Gallstones    GERD (gastroesophageal reflux disease)    GERD (gastroesophageal reflux  disease)    Heart palpitations    Hepatitis    hepatitis A; past hx.,many yrs ago ? food source   Hernia, hiatal    Hyperlipidemia    Hypertension    Obesity    Sleep apnea    CPAP    Past Surgical History:  Procedure Laterality Date   APPENDECTOMY  2004   CATARACT EXTRACTION W/ INTRAOCULAR LENS IMPLANT     both eyes   CHOLECYSTECTOMY     LEFT HEART CATH AND CORONARY ANGIOGRAPHY N/A 03/19/2018   Procedure: LEFT HEART CATH AND CORONARY ANGIOGRAPHY;  Surgeon: Victoria Hazel, MD;  Location: [facility] INVASIVE CV LAB;  Service: Cardiovascular;  Laterality: N/A;   NM MYOCAR PERF WALL MOTION  08/15/04   No ischemia   RETINAL DETACHMENT SURGERY Bilateral    remains with slight hazy vision with lights   RIGHT/LEFT HEART CATH AND CORONARY ANGIOGRAPHY N/A 07/11/2020   Procedure: RIGHT/LEFT HEART CATH AND CORONARY ANGIOGRAPHY;  Surgeon:  Victoria Bollman, MD;  Location: [facility] INVASIVE CV LAB;  Service: Cardiovascular;  Laterality: N/A;   TOTAL HIP ARTHROPLASTY Right 08/04/2014   Procedure: RIGHT TOTAL HIP ARTHROPLASTY ANTERIOR APPROACH;  Surgeon: Victoria Drilling, MD;  Location: [facility] ORS;  Service: Orthopedics;  Laterality: Right;   TOTAL HIP ARTHROPLASTY Left 01/05/2015   Procedure: LEFT TOTAL HIP ARTHROPLASTY ANTERIOR APPROACH;  Surgeon: Victoria Drilling, MD;  Location: [facility] ORS;  Service: Orthopedics;  Laterality: Left;   US ECHOCARDIOGRAPHY  06/25/2012   Moderate ASH,LV hyperdynamic,LA is mod. dilated,trace MR,TR,AI     Home Medications:  Prior to Admission medications   Medication Sig Start Date End Date Taking? Authorizing Provider  acetaminophen (TYLENOL) 500 MG tablet Take 1,000 mg by mouth every 6 (six) hours as needed for moderate pain or headache.   Yes [provider]  albuterol (VENTOLIN HFA) 108 (90 Base) MCG/ACT inhaler Inhale 2 puffs into the lungs every 4 (four) hours as needed for wheezing or shortness of breath.  12/21/19  Yes [provider]  alum & mag hydroxide-simeth (MAALOX/MYLANTA) 200-200-20 MG/5ML suspension Take 30 mLs by mouth every 6 (six) hours as needed for indigestion or heartburn.   Yes [provider]  aspirin EC 81 MG tablet Take 81 mg by mouth daily. Swallow whole.   Yes [provider]  cholecalciferol (VITAMIN D3) 25 MCG (1000 UNIT) tablet Take 1,000 Units by mouth 2 (two) times daily.   Yes [provider]  CINNAMON PO Take 1 capsule by mouth in the morning and at bedtime.   Yes [provider]  CRESTOR 20 MG tablet TAKE 1 TABLET(20 MG) BY MOUTH DAILY Patient taking differently: Take 20 mg by mouth daily. 09/19/21  Yes Victoria Rotunda, MD  furosemide (LASIX) 40 MG tablet Take 1 tablet (40 mg total) by mouth daily. 06/22/21  Yes Victoria Rotunda, MD  loteprednol (LOTEMAX) 0.5 % ophthalmic suspension Place 1 drop into both eyes daily as needed  (imflammation).    Yes [provider]  metoprolol succinate (TOPROL XL) 50 MG 24 hr tablet TAKE 1 TAKE BY MOUTH EVERY MORNING AND 1/2 TABLET EVERY EVENING Patient taking differently: Take 25-50 mg by mouth See admin instructions. 50 mg in the morning 25 mg in the evening 06/30/21  Yes Roldan Laforest, Fayrene Fearing, MD  Multiple Vitamin (MULTIVITAMIN) tablet Take 1 tablet by mouth daily.   Yes [provider]  Multiple Vitamins-Minerals (ZINC PO) Take 1 tablet by mouth daily.   Yes [provider]  nitroGLYCERIN (NITROSTAT)  0.4 MG SL tablet Place 1 tablet (0.4 mg total) under the tongue every 5 (five) minutes as needed for chest pain. 05/07/16  Yes Rosalio Macadamia, NP  omeprazole (PRILOSEC) 10 MG capsule Take 10 mg by mouth daily.   Yes [provider]  ezetimibe (ZETIA) 10 MG tablet Take 1 tablet (10 mg total) by mouth daily. Patient not taking: Reported on 11/22/2021 03/31/21 08/08/21  Victoria Rotunda, MD  umeclidinium-vilanterol [facility] ELLIPTA) 62.5-25 MCG/ACT AEPB Inhale 1 puff into the lungs daily.    [provider]    Inpatient Medications: Scheduled Meds:  aspirin EC  81 mg Oral Daily   enoxaparin (LOVENOX) injection  60 mg Subcutaneous Q24H   ezetimibe  10 mg Oral Daily   pantoprazole  40 mg Oral Daily   rosuvastatin  20 mg Oral Daily   Continuous Infusions:  [START ON 11/24/2021] remdesivir 100 mg in NS 100 mL     PRN Meds: acetaminophen **OR** acetaminophen  Allergies:    Allergies  Allergen Reactions   Codeine Nausea Only   Kenalog [Triamcinolone Acetonide] Other (See Comments)    Had A.fib after she received the injection."Steroid meds"   Relafen [Nabumetone] Other (See Comments)    Edema    Epinephrine Palpitations   Penicillins Rash    40 years ago, injection site was red and swollen.  rash, facial/tongue/throat swelling, SOB or lightheadedness with hypotension: Yes Has patient had a PCN reaction causing immediate  Has patient had a  PCN reaction causing severe rash involving mucus membranes or skin necrosis: NO Has patient had a PCN reaction that required hospitalization. No  Has patient had a PCN reaction occurring within the last 10 years: No If all of the above answers are "NO", then may proceed with Cephalosporin use.     Tramadol Other (See Comments)    Auditory halllucinations    Social History:   Social History   Socioeconomic History   Marital status: Married    Spouse name: Not on file   Number of children: 2   Years of education: Not on file   Highest education level: Not on file  Occupational History    Employer: OTHER  Tobacco Use   Smoking status: Former   Smokeless tobacco: Never   Tobacco comments:    QUIT IN 1990  Vaping Use   Vaping Use: Never used  Substance and Sexual Activity   Alcohol use: No    Alcohol/week: 0.0 standard drinks   Drug use: No   Sexual activity: Yes  Other Topics Concern   Not on file  Social History Narrative   Two grandchildren.            Social Determinants of Health   Financial Resource Strain: Not on file  Food Insecurity: Not on file  Transportation Needs: Not on file  Physical Activity: Not on file  Stress: Not on file  Social Connections: Not on file  Intimate Partner Violence: Not on file    Family History:    Family History  Problem Relation Age of Onset   Hypertension Mother    CVA Mother    Lung cancer Father 66       asbestos exposure   Cancer Father    Esophageal cancer Other        nephew   Diabetes Other        nephew   Colon cancer Neg Hx    Pancreatic cancer Neg Hx    Kidney disease Neg Hx  Liver disease Neg Hx      ROS:  Please see the history of present illness.  General:+ colds no fevers, no weight changes, decreased appetite  Skin:no rashes or ulcers HEENT:no blurred vision, no congestion CV:see HPI PUL:see HPI GI:no diarrhea constipation or melena, no indigestion GU:no hematuria, no dysuria MS:no joint  pain, no claudication Neuro:no syncope, no lightheadedness Endo:no diabetes, no thyroid disease  All other ROS reviewed and negative.     Physical Exam/Data:   Vitals:   11/23/21 0900 11/23/21 1000 11/23/21 1030 11/23/21 1045  BP: 99/70 (!) 88/68 (!) 81/59 (!) 113/99  Pulse: (!) 132 (!) 138 (!) 143 (!) 147  Resp: (!) 24 (!) 29 (!) 24 (!) 31  Temp:      TempSrc:      SpO2: 95% 96% 95% 98%  Weight:      Height:        Intake/Output Summary (Last 24 hours) at 11/23/2021 1212 Last data filed at 11/23/2021 0649 Gross per 24 hour  Intake 1318.48 ml  Output 350 ml  Net 968.48 ml   Last 3 Weights 11/23/2021 11/14/2021 08/08/2021  Weight (lbs) 279 lb 15.8 oz 280 lb 280 lb 6.4 oz  Weight (kg) 127 kg 127.007 kg 127.189 kg     Body mass index is 42.57 kg/m.  Exam per Dr. Percival Spanish    Relevant CV Studies: 11/03/21  TTE  AORTIC VALVE      Leaflets: PROSTHETIC            Morphology: Normal      Mobility: Fully Mobile      AOV Note: TRANSCATH SAPIEN3 ULTRA THV CM 23MM   LEFT VENTRICLE                                      Anterior: Normal          Size: Normal                                 Lateral: Normal   Contraction: Normal                                  Septal: Normal    Closest EF: >55% (Estimated)                        Apical: Normal     LV masses: No Masses                             Inferior: Normal           LVH: MODERATE LVH CONCENTRIC              Posterior: Normal   LV GLS(GE): -17.0% Normal Range [ <= -16]  Dias.FxClass: INDETERMINATE   MITRAL VALVE      Leaflets: ABNORMAL                Mobility: PARTIALLY MOBILE    Morphology: ANNULAR CALC   LEFT ATRIUM          Size: SEVERELY ENLARGED     LA masses: No masses                Normal IAS  MAIN PA          Size: Not seen   PULMONIC VALVE    Morphology: Normal      Mobility: Fully Mobile   RIGHT VENTRICLE          Size: Normal                    Free wall: Normal   Contraction: Normal                     RV masses: No Masses        RV GLS:   0.0% Normal Range [ <= -15]         TAPSE:   2.4 cm,  Normal Range [>= 1.6 cm]   TRICUSPID VALVE      Leaflets: Normal                  Mobility: Fully mobile    Morphology: Normal   RIGHT ATRIUM          Size: Normal                     RA Other: None     RA masses: No masses   PERICARDIUM         Fluid: No effusion   INFERIOR VENACAVA          Size: Normal     Normal respiratory collapse   DOPPLER ECHO and OTHER SPECIAL PROCEDURES ------------------------------------     Aortic: TRIVIAL AR             TAVR      3.2 m/s peak vel   42 mmHg peak grad  28 mmHg mean grad 1.2 cm2 by DOPPLER   LVOT Diam: 1.9 cm. Resting LVOT Vel: 1.4 m/s. Dimensionless Index: 0.43      Mitral: TRIVIAL MR             MILD MS      1.6 m/s peak vel   11 mmHg peak grad   6 mmHg mean grad     MV Inflow E Vel.= 152.0 cm/s  MV Annulus E'Vel.= 4.0 cm/s  E/E'Ratio= 38   Tricuspid: No TR                  No TS   Pulmonary: No PR                  No PS       Other:   INTERPRETATION ---------------------------------------------------------------    NORMAL LEFT VENTRICULAR SYSTOLIC FUNCTION WITH MODERATE LVH    NORMAL RIGHT VENTRICULAR SYSTOLIC FUNCTION    VALVULAR REGURGITATION: TRIVIAL AR, TRIVIAL MR    VALVULAR STENOSIS: MILD MS    TRIVIAL VALVULAR AR (TAVR)    HYPERDYNAMIC LV WITH AN LVOT GRADIENT OF 68 mmHg (4.1 m/s) AT REST     Compared with prior Echo study on 09/28/2020: SLIGHTLY INCREASED GRADIENTS    ACROSS TAVR    Laboratory Data:  High Sensitivity Troponin:   Recent Labs  Lab 11/22/21 2229 11/23/21 0019 11/23/21 0606 11/23/21 0746  TROPONINIHS 53* 131* 2,478* 2,985*     Chemistry Recent Labs  Lab 11/22/21 2229 11/23/21 0606  NA 131* 132*  K 3.7 3.8  CL 101 104  CO2 21* 21*  GLUCOSE 153* 163*  BUN 14 14  CREATININE 1.04* 0.99  CALCIUM 7.9* 7.7*  MG 1.9  --   GFRNONAA 55* 58*  ANIONGAP 9 7    Recent Labs  Lab 11/23/21 0606   PROT 5.5*  ALBUMIN 2.9*  AST 42*  ALT 23  ALKPHOS 49  BILITOT 0.8   Lipids No results for input(s): CHOL, TRIG, HDL, LABVLDL, LDLCALC, CHOLHDL in the last 168 hours.  Hematology Recent Labs  Lab 11/22/21 2229 11/23/21 0606  WBC 5.4 4.2  RBC 4.51 4.07  HGB 13.5 12.5  HCT 41.5 37.7  MCV 92.0 92.6  MCH 29.9 30.7  MCHC 32.5 33.2  RDW 14.7 15.0  PLT 180 162   Thyroid  Recent Labs  Lab 11/23/21 0606  TSH 1.711    BNPNo results for input(s): BNP, PROBNP in the last 168 hours.  DDimer  Recent Labs  Lab 11/23/21 0606  DDIMER 0.60*     Radiology/Studies:  DG Chest Port 1 View  Result Date: 11/23/2021 CLINICAL DATA:  Chest pain. EXAM: PORTABLE CHEST 1 VIEW COMPARISON:  June 09, 2019 FINDINGS: Mild, chronic appearing increased lung markings are seen with mild bibasilar atelectasis. There is no evidence of a pleural effusion or pneumothorax. The cardiac silhouette is enlarged and unchanged in size. An artificial right aortic valve is seen. There is marked severity calcification of the aortic arch. Degenerative changes are seen throughout the thoracic spine. IMPRESSION: Chronic appearing increased lung markings with mild bibasilar atelectasis. Electronically Signed   By: Virgina Norfolk M.D.   On: 11/23/2021 00:41     Assessment and Plan:   Atrial fib with RVR with hx though none recently.  HR slowed on Dilt but BP dropped, IV fluids given.  She is + for COVID and thought this may be reason for recurrent a fib.  She did have pain with the RVR on admit.   May need amiodarone at this point.  CHA2DS2VASc of 5 and she noted hx of GI bleed on Eliquis in past but with illness would add IV heparin for now.   NSTEMI with hs Troponin 2900.  Would add IV heparin   continue asa, lasix BB as tolerated with BP and respiratory status with COVID continue crestor and zetia recent echo with normal EF ? Repeat limited to eval EF and wall motion  CAD with hx or stents to LCX and diag patent on  last cath 07/2020 otherwise non obst disease see #2 Severe AS with hx TAVR 2021 and normal valve status on echo 12.2.22. + COVID had symptoms 3-4 days before presentation. Now on 02 and remdesivir she refused steroids.  Stated she had reaction in past. HLD on crestor and zetia continue  LDL 116 HDL 38 in April this year, she did not want to increase crestor so zetia added.    Risk Assessment/Risk Scores:     TIMI Risk Score for Unstable Angina or Non-ST Elevation MI:   The patient's TIMI risk score is 6, which indicates a 41% risk of all cause mortality, new or recurrent myocardial infarction or need for urgent revascularization in the next 14 days.    CHA2DS2-VASc Score = 5   This indicates a 7.2% annual risk of stroke. The patient's score is based upon: CHF History: 0 HTN History: 1 Diabetes History: 0 Stroke History: 0 Vascular Disease History: 1 Age Score: 2 Gender Score: 1         For questions or updates, please contact [place] Please consult [email or web address] for contact info under    Signed, Cecilie Kicks, NP  11/23/2021 12:12 PM  History and all data above reviewed.  Patient  examined.  I agree with the findings as above. The patient is well known to me. She has had some increased SOB for about a month.  She has not had chest or arm pain.  No PND or orthopnea.  No swelling.  She does not weigh herself.  She does not think that she has been in atrial fib.  She started to have URI symptoms early this week.  She had then yesterday a cough.  She thought that she was having some palpitations though the symptoms were vague but thought she was in atrial fib.  She denies presyncope or syncope.  Found to be in atrial fib with RVR in the ED.  Trop are elevated.  Initially received IV Dilt but her HR BP fell.  She required hydration and discontinue of Cardizem.  She did have some arm pain last night but this has resolved.    EKG is without acute ST T wave changes.  She is found to  be  COVID positive.    GENERAL:  Well appearing HEENT:  Pupils equal round and reactive, fundi not visualized, oral mucosa unremarkable NECK:  No jugular venous distention, waveform within normal limits, carotid upstroke brisk and symmetric, no bruits, no thyromegaly LYMPHATICS:  No cervical, inguinal adenopathy LUNGS:   Few basilar crackles BACK:  No CVA tenderness CHEST:  Unremarkable HEART:  PMI not displaced or sustained,S1 and S2 within normal limits, no S3, no S4, no clicks, no [id number] apical short systolic murmur, no diastolic murmurs, irregular  ABD:  Flat, positive bowel sounds normal in frequency in pitch, no bruits, no rebound, no guarding, no midline pulsatile mass, no hepatomegaly, no splenomegaly EXT:  2 plus pulses throughout, mild leg edema, no cyanosis no clubbing SKIN:  No rashes no nodules NEURO:  Cranial nerves II through XII grossly intact, motor grossly intact throughout PSYCH:  Cognitively intact, oriented to person place and time  All available labs, radiology testing, previous records reviewed. Agree with documented assessment and plan.   Atrial fib:  Unable to be on control her rate.  Not on long term anticoagulation because of GI bleeding.  She has not wanted further anticoag therapy.  She has not had symptomatic atrial fib.  For now we need to start IV amiodarone.  I do think that she started this last night.  I am going to start heparin.  I am going to start amiodarone.  I suspect she will go back in to NSR.  Elevated troponin:  I suspect demand ischemia.    I am not planning cath or repeat echo.  She just had one at [facility].      Jeneen Rinks Eldine Rencher  12:54 PM  11/23/2021

## 2021-11-23 NOTE — ED Notes (Signed)
MD Toniann Fail notified about SBP 70-80s after bolus. Cardizem paused appx 0130.

## 2021-11-23 NOTE — ED Notes (Signed)
MD Kakrakandy at bedside. 

## 2021-11-24 ENCOUNTER — Inpatient Hospital Stay (HOSPITAL_COMMUNITY): Payer: Medicare Other

## 2021-11-24 DIAGNOSIS — I4891 Unspecified atrial fibrillation: Secondary | ICD-10-CM | POA: Diagnosis not present

## 2021-11-24 DIAGNOSIS — R778 Other specified abnormalities of plasma proteins: Secondary | ICD-10-CM

## 2021-11-24 DIAGNOSIS — U071 COVID-19: Secondary | ICD-10-CM | POA: Diagnosis not present

## 2021-11-24 DIAGNOSIS — R0603 Acute respiratory distress: Secondary | ICD-10-CM

## 2021-11-24 LAB — GLUCOSE, CAPILLARY
Glucose-Capillary: 195 mg/dL — ABNORMAL HIGH (ref 70–99)
Glucose-Capillary: 181 mg/dL — ABNORMAL HIGH (ref 70–99)
Glucose-Capillary: 203 mg/dL — ABNORMAL HIGH (ref 70–99)
Glucose-Capillary: 227 mg/dL — ABNORMAL HIGH (ref 70–99)
Glucose-Capillary: 203 mg/dL — ABNORMAL HIGH (ref 70–99)

## 2021-11-24 LAB — COMPREHENSIVE METABOLIC PANEL
ALT: 25 U/L (ref 0–44)
ALT: 25 U/L (ref 0–44)
AST: 57 U/L — ABNORMAL HIGH (ref 15–41)
AST: 64 U/L — ABNORMAL HIGH (ref 15–41)
Albumin: 3.1 g/dL — ABNORMAL LOW (ref 3.5–5.0)
Albumin: 3.3 g/dL — ABNORMAL LOW (ref 3.5–5.0)
Alkaline Phosphatase: 60 U/L (ref 38–126)
Alkaline Phosphatase: 54 U/L (ref 38–126)
Anion gap: 8 (ref 5–15)
Anion gap: 12 (ref 5–15)
BUN: 13 mg/dL (ref 8–23)
BUN: 12 mg/dL (ref 8–23)
CO2: 24 mmol/L (ref 22–32)
CO2: 21 mmol/L — ABNORMAL LOW (ref 22–32)
Calcium: 8.2 mg/dL — ABNORMAL LOW (ref 8.9–10.3)
Calcium: 8 mg/dL — ABNORMAL LOW (ref 8.9–10.3)
Chloride: 102 mmol/L (ref 98–111)
Chloride: 103 mmol/L (ref 98–111)
Creatinine, Ser: 1.03 mg/dL — ABNORMAL HIGH (ref 0.44–1.00)
Creatinine, Ser: 1.03 mg/dL — ABNORMAL HIGH (ref 0.44–1.00)
GFR, Estimated: 56 mL/min — ABNORMAL LOW (ref >60)
GFR, Estimated: 56 mL/min — ABNORMAL LOW (ref >60)
Glucose, Bld: 195 mg/dL — ABNORMAL HIGH (ref 70–99)
Glucose, Bld: 188 mg/dL — ABNORMAL HIGH (ref 70–99)
Potassium: 4.7 mmol/L (ref 3.5–5.1)
Potassium: 4.5 mmol/L (ref 3.5–5.1)
Sodium: 134 mmol/L — ABNORMAL LOW (ref 135–145)
Sodium: 136 mmol/L (ref 135–145)
Total Bilirubin: 0.8 mg/dL (ref 0.3–1.2)
Total Bilirubin: 0.8 mg/dL (ref 0.3–1.2)
Total Protein: 6 g/dL — ABNORMAL LOW (ref 6.5–8.1)
Total Protein: 6.3 g/dL — ABNORMAL LOW (ref 6.5–8.1)

## 2021-11-24 LAB — LACTIC ACID, PLASMA
Lactic Acid, Venous: 1.8 mmol/L (ref 0.5–1.9)
Lactic Acid, Venous: 2.1 mmol/L (ref 0.5–1.9)

## 2021-11-24 LAB — CBC WITH DIFFERENTIAL/PLATELET
Abs Immature Granulocytes: 0.03 10*3/uL (ref 0.00–0.07)
Abs Immature Granulocytes: 0.03 10*3/uL (ref 0.00–0.07)
Basophils Absolute: 0 10*3/uL (ref 0.0–0.1)
Basophils Absolute: 0 10*3/uL (ref 0.0–0.1)
Basophils Relative: 0 %
Basophils Relative: 0 %
Eosinophils Absolute: 0.2 10*3/uL (ref 0.0–0.5)
Eosinophils Absolute: 0 10*3/uL (ref 0.0–0.5)
Eosinophils Relative: 2 %
Eosinophils Relative: 0 %
HCT: 41.1 % (ref 36.0–46.0)
HCT: 43.3 % (ref 36.0–46.0)
Hemoglobin: 13.3 g/dL (ref 12.0–15.0)
Hemoglobin: 14.2 g/dL (ref 12.0–15.0)
Immature Granulocytes: 0 %
Immature Granulocytes: 0 %
Lymphocytes Relative: 15 %
Lymphocytes Relative: 19 %
Lymphs Abs: 1.2 10*3/uL (ref 0.7–4.0)
Lymphs Abs: 1.7 10*3/uL (ref 0.7–4.0)
MCH: 30 pg (ref 26.0–34.0)
MCH: 30.5 pg (ref 26.0–34.0)
MCHC: 32.4 g/dL (ref 30.0–36.0)
MCHC: 32.8 g/dL (ref 30.0–36.0)
MCV: 92.8 fL (ref 80.0–100.0)
MCV: 92.9 fL (ref 80.0–100.0)
Monocytes Absolute: 0.6 10*3/uL (ref 0.1–1.0)
Monocytes Absolute: 0.7 10*3/uL (ref 0.1–1.0)
Monocytes Relative: 7 %
Monocytes Relative: 8 %
Neutro Abs: 5.9 10*3/uL (ref 1.7–7.7)
Neutro Abs: 6.4 10*3/uL (ref 1.7–7.7)
Neutrophils Relative %: 76 %
Neutrophils Relative %: 73 %
Platelets: 202 10*3/uL (ref 150–400)
Platelets: 226 10*3/uL (ref 150–400)
RBC: 4.43 MIL/uL (ref 3.87–5.11)
RBC: 4.66 MIL/uL (ref 3.87–5.11)
RDW: 15 % (ref 11.5–15.5)
RDW: 15.2 % (ref 11.5–15.5)
WBC: 7.9 10*3/uL (ref 4.0–10.5)
WBC: 8.8 10*3/uL (ref 4.0–10.5)
nRBC: 0 % (ref 0.0–0.2)
nRBC: 0 % (ref 0.0–0.2)

## 2021-11-24 LAB — TROPONIN I (HIGH SENSITIVITY)
Troponin I (High Sensitivity): 2722 ng/L (ref <18)
Troponin I (High Sensitivity): 2880 ng/L (ref <18)
Troponin I (High Sensitivity): 2110 ng/L (ref <18)
Troponin I (High Sensitivity): 2759 ng/L (ref <18)

## 2021-11-24 LAB — ECHOCARDIOGRAM LIMITED
AR max vel: 1.31 cm2
AV Area VTI: 1.02 cm2
AV Area mean vel: 1.34 cm2
AV Mean grad: 12.7 mmHg
AV Peak grad: 24.9 mmHg
Ao pk vel: 2.5 m/s
Area-P 1/2: 1.97 cm2
Height: 68 in
S' Lateral: 3.5 cm
Weight: 4479.75 oz

## 2021-11-24 LAB — D-DIMER, QUANTITATIVE: D-Dimer, Quant: 0.43 ug/mL-FEU (ref 0.00–0.50)

## 2021-11-24 LAB — HEPARIN LEVEL (UNFRACTIONATED)
Heparin Unfractionated: 0.92 IU/mL — ABNORMAL HIGH (ref 0.30–0.70)
Heparin Unfractionated: 0.62 IU/mL (ref 0.30–0.70)

## 2021-11-24 LAB — C-REACTIVE PROTEIN
CRP: 5.2 mg/dL — ABNORMAL HIGH (ref <1.0)
CRP: 5.8 mg/dL — ABNORMAL HIGH (ref <1.0)

## 2021-11-24 LAB — FERRITIN: Ferritin: 244 ng/mL (ref 11–307)

## 2021-11-24 LAB — MRSA NEXT GEN BY PCR, NASAL: MRSA by PCR Next Gen: NOT DETECTED

## 2021-11-24 LAB — HEMOGLOBIN A1C
Hgb A1c MFr Bld: 6.8 % — ABNORMAL HIGH (ref 4.8–5.6)
Mean Plasma Glucose: 148.46 mg/dL

## 2021-11-24 LAB — BRAIN NATRIURETIC PEPTIDE: B Natriuretic Peptide: 609 pg/mL — ABNORMAL HIGH (ref 0.0–100.0)

## 2021-11-24 MED ORDER — INSULIN ASPART 100 UNIT/ML IJ SOLN
0.0000 [IU] | INTRAMUSCULAR | Status: DC
  Administered 2021-11-24 (×4): 5 [IU] via SUBCUTANEOUS
  Administered 2021-11-24: 04:00:00 3 [IU] via SUBCUTANEOUS
  Administered 2021-11-25 (×4): 5 [IU] via SUBCUTANEOUS

## 2021-11-24 MED ORDER — OMEPRAZOLE 20 MG PO CPDR
20.0000 mg | DELAYED_RELEASE_CAPSULE | Freq: Every day | ORAL | Status: DC
  Administered 2021-11-24 – 2021-11-28 (×5): 20 mg via ORAL
  Filled 2021-11-24 (×5): qty 1

## 2021-11-24 MED ORDER — SODIUM CHLORIDE 0.9 % IV SOLN
500.0000 mg | INTRAVENOUS | Status: DC
  Administered 2021-11-24: 07:00:00 500 mg via INTRAVENOUS
  Filled 2021-11-24: qty 5

## 2021-11-24 MED ORDER — APIXABAN 5 MG PO TABS
5.0000 mg | ORAL_TABLET | Freq: Two times a day (BID) | ORAL | Status: DC
  Administered 2021-11-24 – 2021-11-28 (×9): 5 mg via ORAL
  Filled 2021-11-24 (×9): qty 1

## 2021-11-24 MED ORDER — ALUM & MAG HYDROXIDE-SIMETH 200-200-20 MG/5ML PO SUSP
30.0000 mL | Freq: Four times a day (QID) | ORAL | Status: DC | PRN
  Administered 2021-11-24 – 2021-11-25 (×2): 30 mL via ORAL
  Filled 2021-11-24: qty 30

## 2021-11-24 MED ORDER — OMEPRAZOLE 20 MG PO CPDR: 20.0000 mg | DELAYED_RELEASE_CAPSULE | Freq: Every day | ORAL | Status: DC

## 2021-11-24 MED ORDER — SODIUM CHLORIDE 0.9 % IV SOLN: 1.0000 g | INTRAVENOUS | Status: DC

## 2021-11-24 MED ORDER — METOPROLOL TARTRATE 50 MG PO TABS
50.0000 mg | ORAL_TABLET | Freq: Two times a day (BID) | ORAL | Status: DC
  Administered 2021-11-24 – 2021-11-26 (×6): 50 mg via ORAL
  Filled 2021-11-24 (×7): qty 1

## 2021-11-24 MED ORDER — AMIODARONE IV BOLUS ONLY 150 MG/100ML: 150.0000 mg | Freq: Once | INTRAVENOUS | Status: DC

## 2021-11-24 MED ORDER — AMIODARONE LOAD VIA INFUSION
150.0000 mg | Freq: Once | INTRAVENOUS | Status: AC
  Administered 2021-11-24: 01:00:00 150 mg via INTRAVENOUS
  Filled 2021-11-24: qty 83.34

## 2021-11-24 MED ORDER — CEFTRIAXONE SODIUM 1 G IJ SOLR
1.0000 g | INTRAMUSCULAR | Status: DC
  Administered 2021-11-24: 06:00:00 1 g via INTRAVENOUS
  Filled 2021-11-24: qty 10

## 2021-11-24 MED ORDER — METHYLPREDNISOLONE SODIUM SUCC 125 MG IJ SOLR
0.5000 mg/kg | Freq: Two times a day (BID) | INTRAMUSCULAR | Status: DC
  Administered 2021-11-24: 04:00:00 63.75 mg via INTRAVENOUS
  Filled 2021-11-24: qty 2

## 2021-11-24 MED ORDER — SODIUM CHLORIDE 0.9 % IV BOLUS
1000.0000 mL | Freq: Once | INTRAVENOUS | Status: AC
  Administered 2021-11-24: 01:00:00 1000 mL via INTRAVENOUS

## 2021-11-24 MED ORDER — ADENOSINE 6 MG/2ML IV SOLN
INTRAVENOUS | Status: AC
  Filled 2021-11-24: qty 4

## 2021-11-24 MED ORDER — FUROSEMIDE 10 MG/ML IJ SOLN
60.0000 mg | Freq: Once | INTRAMUSCULAR | Status: AC
  Administered 2021-11-24: 03:00:00 60 mg via INTRAVENOUS

## 2021-11-24 MED ORDER — METOPROLOL TARTRATE 5 MG/5ML IV SOLN
INTRAVENOUS | Status: AC
  Administered 2021-11-24: 05:00:00 2.5 mg via INTRAVENOUS
  Filled 2021-11-24: qty 5

## 2021-11-24 MED ORDER — PREDNISONE 20 MG PO TABS: 50.0000 mg | ORAL_TABLET | Freq: Every day | ORAL | Status: DC

## 2021-11-24 MED ORDER — DIGOXIN 0.25 MG/ML IJ SOLN
0.5000 mg | Freq: Once | INTRAMUSCULAR | Status: AC
  Administered 2021-11-24: 04:00:00 0.5 mg via INTRAVENOUS
  Filled 2021-11-24: qty 2

## 2021-11-24 MED ORDER — METHYLPREDNISOLONE SODIUM SUCC 125 MG IJ SOLR
60.0000 mg | Freq: Two times a day (BID) | INTRAMUSCULAR | Status: DC
  Administered 2021-11-24 – 2021-11-25 (×2): 60 mg via INTRAVENOUS
  Filled 2021-11-24: qty 2

## 2021-11-24 MED ORDER — FUROSEMIDE 10 MG/ML IJ SOLN
INTRAMUSCULAR | Status: AC
  Filled 2021-11-24: qty 2

## 2021-11-24 MED ORDER — PHENYLEPHRINE HCL-NACL 20-0.9 MG/250ML-% IV SOLN
0.0000 ug/min | INTRAVENOUS | Status: DC
  Administered 2021-11-24: 06:00:00 20 ug/min via INTRAVENOUS
  Filled 2021-11-24 (×2): qty 250

## 2021-11-24 MED ORDER — FUROSEMIDE 10 MG/ML IJ SOLN
INTRAMUSCULAR | Status: AC
  Filled 2021-11-24: qty 4

## 2021-11-24 MED ORDER — NON FORMULARY: 20.0000 mg | Freq: Every day | Status: DC

## 2021-11-24 MED ORDER — CHLORHEXIDINE GLUCONATE CLOTH 2 % EX PADS
6.0000 | MEDICATED_PAD | Freq: Every day | CUTANEOUS | Status: DC
  Administered 2021-11-24 – 2021-11-27 (×4): 6 via TOPICAL

## 2021-11-24 MED ORDER — METOPROLOL TARTRATE 5 MG/5ML IV SOLN
5.0000 mg | Freq: Once | INTRAVENOUS | Status: AC
  Filled 2021-11-24: qty 5

## 2021-11-24 MED ORDER — PHENYLEPHRINE HCL-NACL 20-0.9 MG/250ML-% IV SOLN: 0.0000 ug/min | INTRAVENOUS | Status: DC

## 2021-11-24 MED ORDER — SODIUM CHLORIDE 0.9 % IV SOLN: INTRAVENOUS | Status: DC | PRN

## 2021-11-24 NOTE — Progress Notes (Signed)
ANTICOAGULATION CONSULT NOTE - Consult  Pharmacy Consult for heparin > apixaban Indication: atrial fibrillation  Allergies  Allergen Reactions   Codeine Nausea Only   Kenalog [Triamcinolone Acetonide] Other (See Comments)    Had A.fib after she received the injection."Steroid meds"   Relafen [Nabumetone] Other (See Comments)    Edema    Epinephrine Palpitations   Penicillins Rash    40 years ago, injection site was red and swollen.  rash, facial/tongue/throat swelling, SOB or lightheadedness with hypotension: Yes Has patient had a PCN reaction causing immediate  Has patient had a PCN reaction causing severe rash involving mucus membranes or skin necrosis: NO Has patient had a PCN reaction that required hospitalization. No  Has patient had a PCN reaction occurring within the last 10 years: No If all of the above answers are "NO", then may proceed with Cephalosporin use.     Tramadol Other (See Comments)    Auditory halllucinations    Patient Measurements: Height: 5\' 8"  (172.7 cm) Weight: 127 kg (279 lb 15.8 oz) IBW/kg (Calculated) : 63.9 Heparin Dosing Weight: 94 kg  Vital Signs: Temp: 98.5 F (36.9 C) (12/23 0800) Temp Source: Oral (12/23 0800) BP: 125/91 (12/23 1002) Pulse Rate: 119 (12/23 1002)  Labs: Recent Labs    11/23/21 0606 11/23/21 0746 11/24/21 0051 11/24/21 0222 11/24/21 0325 11/24/21 0618 11/24/21 1017  HGB 12.5  --  13.3  --  14.2  --   --   HCT 37.7  --  41.1  --  43.3  --   --   PLT 162  --  202  --  226  --   --   HEPARINUNFRC  --   --  0.92*  --   --   --  0.62  CREATININE 0.99  --  1.03*  --  1.03*  --   --   TROPONINIHS 2,478*   < > 2,880* 2,722* 2,110* 2,759*  --    < > = values in this interval not displayed.     Estimated Creatinine Clearance: 63.3 mL/min (A) (by C-G formula based on SCr of 1.03 mg/dL (H)).   Medical History: Past Medical History:  Diagnosis Date   Abdominal pain    Arthritis    osteoarthritis-hips,knees, back,  hands   Asymmetric septal hypertrophy (HCC)    Atrial fibrillation (HCC)    CAD (coronary artery disease)    2 drug-eluting stents placed in the circumflex leading into a marginal.  May 2017.  June 2017.   catheterization on 03/19/2018, this showed patent stent in the proximal to mid left circumflex artery, 20% ostial left circumflex artery disease, 40% OM 3 disease, widely patent stent in the ostial D1, 20% ostial to proximal LAD disease, 20% mid LAD disease.   Cataract    corrective surgery done   Fatty liver    Gallstones    GERD (gastroesophageal reflux disease)    GERD (gastroesophageal reflux disease)    Heart palpitations    Hepatitis    hepatitis A; past hx.,many yrs ago ? food source   Hernia, hiatal    Hyperlipidemia    Hypertension    Obesity    Sleep apnea    CPAP    Medications:  Medications Prior to Admission  Medication Sig Dispense Refill Last Dose   acetaminophen (TYLENOL) 500 MG tablet Take 1,000 mg by mouth every 6 (six) hours as needed for moderate pain or headache.   Past Month   albuterol (VENTOLIN HFA) 108 (90  Base) MCG/ACT inhaler Inhale 2 puffs into the lungs every 4 (four) hours as needed for wheezing or shortness of breath.    unk   alum & mag hydroxide-simeth (MAALOX/MYLANTA) 200-200-20 MG/5ML suspension Take 30 mLs by mouth every 6 (six) hours as needed for indigestion or heartburn.   11/21/2021   aspirin EC 81 MG tablet Take 81 mg by mouth daily. Swallow whole.   11/22/2021   cholecalciferol (VITAMIN D3) 25 MCG (1000 UNIT) tablet Take 1,000 Units by mouth 2 (two) times daily.   11/22/2021   CINNAMON PO Take 1 capsule by mouth in the morning and at bedtime.   11/22/2021   CRESTOR 20 MG tablet TAKE 1 TABLET(20 MG) BY MOUTH DAILY (Patient taking differently: Take 20 mg by mouth daily.) 90 tablet 3 11/22/2021   furosemide (LASIX) 40 MG tablet Take 1 tablet (40 mg total) by mouth daily. 90 tablet 3 11/22/2021   loteprednol (LOTEMAX) 0.5 % ophthalmic  suspension Place 1 drop into both eyes daily as needed (imflammation).    unk   metoprolol succinate (TOPROL XL) 50 MG 24 hr tablet TAKE 1 TAKE BY MOUTH EVERY MORNING AND 1/2 TABLET EVERY EVENING (Patient taking differently: Take 25-50 mg by mouth See admin instructions. 50 mg in the morning 25 mg in the evening) 135 tablet 3 11/22/2021 at 1900   Multiple Vitamin (MULTIVITAMIN) tablet Take 1 tablet by mouth daily.   11/22/2021   Multiple Vitamins-Minerals (ZINC PO) Take 1 tablet by mouth daily.   11/22/2021   nitroGLYCERIN (NITROSTAT) 0.4 MG SL tablet Place 1 tablet (0.4 mg total) under the tongue every 5 (five) minutes as needed for chest pain. 25 tablet 3 unk   omeprazole (PRILOSEC) 10 MG capsule Take 10 mg by mouth daily.   11/22/2021   umeclidinium-vilanterol (ANORO ELLIPTA) 62.5-25 MCG/ACT AEPB Inhale 1 puff into the lungs daily.       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 Pharmacy has been consulted for heparin dosing for atrial fibrillation. Patient is not on anticoagulation PTA   Heparin drip 1100 uts/hr Heparin level 0.6 at goal and no noted bleeding, CBC stable.  On amiodarone drip for rate control - considering DCCV in future. Plan to convert to apixaban Wt > 60kg, age < 78yo Cr< 1.5  Goal of Therapy:  Heparin level 0.3-0.7 units/ml Monitor platelets by anticoagulation protocol: Yes   Plan:  Stop heparin drip Start apixaban 5mg  BID Daily Bmet and CBC Monitor s/s bleeidng   Bonnita Nasuti Pharm.D. CPP, BCPS Clinical Pharmacist [phone] 11/24/2021 11:37 AM

## 2021-11-24 NOTE — Progress Notes (Addendum)
[facility] floor coverage note:  Pt seen at bedside.  A.Fib RVR 140-160 despite amiodarone drip.  BP continues to be low / soft at 81/63 (not 132/79 as in chart).  Pt continues to have anginal symptoms of arm pain, worsening SOB.  Dr. Julianne Handler started pt on re-bolus of amiodarone + 1L IVF bolus.  Recd calling cards on call at this point.

## 2021-11-24 NOTE — Progress Notes (Signed)
ANTICOAGULATION CONSULT NOTE - Follow Up Consult  Pharmacy Consult for heparin Indication: atrial fibrillation  Labs: Recent Labs    11/22/21 2229 11/23/21 0019 11/23/21 0606 11/23/21 0746 11/24/21 0051  HGB 13.5  --  12.5  --  13.3  HCT 41.5  --  37.7  --  41.1  PLT 180  --  162  --  202  HEPARINUNFRC  --   --   --   --  0.92*  CREATININE 1.04*  --  0.99  --   --   TROPONINIHS 53* 131* 2,478* 2,985*  --     Assessment: 80yo female supratherapeutic on heparin with initial dosing for Afib; no infusion issues or signs of bleeding reported.  Goal of Therapy:  Heparin level 0.3-0.7 units/ml   Plan:  Will decrease heparin infusion by 3 units/kgABW/hr to 1100 units/hr and check level in 8 hours.    Vernard Gambles, PharmD, BCPS  11/24/2021,1:41 AM

## 2021-11-24 NOTE — Consult Note (Signed)
NAME:  Victoria Delgado, MRN:  [id number], DOB:  June 06, 1941, LOS: 1 ADMISSION DATE:  11/22/2021, CONSULTATION DATE:  11/24/21 REFERRING MD:  Alcario Drought, CHIEF COMPLAINT:  atrial fibrillation   History of Present Illness:  Victoria Delgado is a 80 y.o. F with PMH of paroxysmal atrial fibrillation not on anti-coagulation, CAD, HL, HTN, AVS with TAVR, HOCM, OSA and hepatitis who began developing a cough and shortness of breath around 12/19, several days later she presented to the ED secondary to palpitations and was in atrial fibrillation with RVR.  She tested positive for Covid-19, CXR without significant infiltrates and inflammatory markers only mildly elevated.  She was admitted and started on amiodarone and remdesevir.   Pt's heart rate remained poorly controlled and she complained of L shoulder pain. She was given IVF and then developed pulmonary edema and required HFNC.  Troponin peaked at 2,985 and cardiology was consulted.  She had intermittent hypotension with RVR and so PCCM consulted 12/23.  Pertinent  Medical History   has a past medical history of Abdominal pain, Arthritis, Asymmetric septal hypertrophy ([place]), Atrial fibrillation ([place]), CAD (coronary artery disease), Cataract, Fatty liver, Gallstones, GERD (gastroesophageal reflux disease), GERD (gastroesophageal reflux disease), Heart palpitations, Hepatitis, Hernia, hiatal, Hyperlipidemia, Hypertension, Obesity, and Sleep apnea.   Significant Hospital Events: Including procedures, antibiotic start and stop dates in addition to other pertinent events   12/22 admit to hospitalist service  12/23 intermittent hypotension with RVR, transfer to ICU and PCCM consult  Interim History / Subjective:  Pt awake and conversational. Feels dyspneic but denies chest or shoulder pain  Objective   Blood pressure 114/90, pulse (!) 144, temperature 98.5 F (36.9 C), temperature source Oral, resp. rate (!) 25, height 5\' 8"  (1.727 m), weight 127 kg, SpO2 98 %.     FiO2 (%):  [40 %] 40 %   Intake/Output Summary (Last 24 hours) at 11/24/2021 0356 Last data filed at 11/24/2021 0327 Gross per 24 hour  Intake 570.03 ml  Output 875 ml  Net -304.97 ml   Filed Weights   11/23/21 0740  Weight: 127 kg   General:  elderly F sitting up in bed mildly dyspneic but not in severe distress HEENT: MM pink/moist, sclera anicteric Neuro: awake, oriented and conversation without focal deficits CV: s1s2 tachycardic, irregular, no m/r/g PULM:  mildly decreased in the bilateral bases without wheezing or rhonchi, tachypneic without accessory muscle use  GI: soft, bsx4 active  Extremities: warm/dry, 2+ edema  Skin: no rashes or lesions   Resolved Hospital Problem list     Assessment & Plan:   Atrial Fibrillation with RVR NSTEMI Acute on chronic HF exacerbation Baseline HTN, CAD, AVS with TAVR, HFpEF On amiodarone gtt Intermittently hypotensive, though improved at the time of transfer -monitor in ICU, may need peripheral pressors and possibly DCCV, management per cardiology -load with Digoxin and continue amiodarone, BP has been to soft for Cardizem -echo pending  -Lasix 60mg  x1 given, monitor UOP -continue heparin gtt -trial Bipap for pulmonary edema   Acute Hypoxic Respiratory Failure Covid-19 + Pt was symptomatic with URI symptoms PTA  -continue steroids and remdesevir -inflammatory markers minimally elevated and concern for infection so hold Baricitinib -Bipap overnight and then can likely transition back to HFNC -consider PE, patient was not anti-coagulated at home secondary to GIB, she is already on a heparin gtt and echo is pending -IS    Best Practice (right click and "Reselect all SmartList Selections" daily)   Diet/type: Regular consistency (see orders)  DVT prophylaxis: systemic heparin GI prophylaxis: N/A Lines: N/A Foley:  N/A Code Status:  full code Last date of multidisciplinary goals of care discussion [pending]  Labs    CBC: Recent Labs  Lab 11/22/21 2229 11/23/21 0606 11/24/21 0051  WBC 5.4 4.2 7.9  NEUTROABS 3.3 2.4 5.9  HGB 13.5 12.5 13.3  HCT 41.5 37.7 41.1  MCV 92.0 92.6 92.8  PLT 180 162 [id number]    Basic Metabolic Panel: Recent Labs  Lab 11/22/21 2229 11/23/21 0606 11/24/21 0051  NA 131* 132* 134*  K 3.7 3.8 4.7  CL 101 104 102  CO2 21* 21* 24  GLUCOSE 153* 163* 195*  BUN 14 14 13   CREATININE 1.04* 0.99 1.03*  CALCIUM 7.9* 7.7* 8.2*  MG 1.9  --   --    GFR: Estimated Creatinine Clearance: 63.3 mL/min (A) (by C-G formula based on SCr of 1.03 mg/dL (H)). Recent Labs  Lab 11/22/21 2229 11/23/21 0606 11/23/21 0746 11/24/21 0051 11/24/21 0221  WBC 5.4 4.2  --  7.9  --   LATICACIDVEN  --  1.2 1.3  --  1.8    Liver Function Tests: Recent Labs  Lab 11/23/21 0606 11/24/21 0051  AST 42* 57*  ALT 23 25  ALKPHOS 49 60  BILITOT 0.8 0.8  PROT 5.5* 6.0*  ALBUMIN 2.9* 3.1*   No results for input(s): LIPASE, AMYLASE in the last 168 hours. No results for input(s): AMMONIA in the last 168 hours.  ABG    Component Value Date/Time   PHART 7.394 07/11/2020 0804   PCO2ART 43.2 07/11/2020 0804   PO2ART 69 (L) 07/11/2020 0804   HCO3 27.5 07/11/2020 0808   TCO2 29 07/11/2020 0808   O2SAT 71.0 07/11/2020 0808     Coagulation Profile: No results for input(s): INR, PROTIME in the last 168 hours.  Cardiac Enzymes: No results for input(s): CKTOTAL, CKMB, CKMBINDEX, TROPONINI in the last 168 hours.  HbA1C: Hgb A1c MFr Bld  Date/Time Value Ref Range Status  11/24/2021 03:25 AM 6.8 (H) 4.8 - 5.6 % Final    Comment:    (NOTE) Pre diabetes:          5.7%-6.4%  Diabetes:              >6.4%  Glycemic control for   <7.0% adults with diabetes   02/24/2020 01:52 PM 5.9 4.6 - 6.5 % Final    Comment:    Glycemic Control Guidelines for People with Diabetes:Non Diabetic:  <6%Goal of Therapy: <7%Additional Action Suggested:  >8%     CBG: Recent Labs  Lab 11/24/21 0212  11/24/21 0345  GLUCAP 195* 181*    Review of Systems:   Review of Systems  Constitutional:  Positive for chills, fever and malaise/fatigue.  HENT:  Positive for congestion.   Respiratory:  Positive for cough, sputum production and shortness of breath. Negative for hemoptysis and wheezing.   Cardiovascular:  Positive for palpitations. Negative for chest pain.  Gastrointestinal: Negative.   Genitourinary: Negative.     Past Medical History:  She,  has a past medical history of Abdominal pain, Arthritis, Asymmetric septal hypertrophy ([place]), Atrial fibrillation ([place]), CAD (coronary artery disease), Cataract, Fatty liver, Gallstones, GERD (gastroesophageal reflux disease), GERD (gastroesophageal reflux disease), Heart palpitations, Hepatitis, Hernia, hiatal, Hyperlipidemia, Hypertension, Obesity, and Sleep apnea.   Surgical History:   Past Surgical History:  Procedure Laterality Date   APPENDECTOMY  2004   CATARACT EXTRACTION W/ INTRAOCULAR LENS IMPLANT     both eyes  CHOLECYSTECTOMY     LEFT HEART CATH AND CORONARY ANGIOGRAPHY N/A 03/19/2018   Procedure: LEFT HEART CATH AND CORONARY ANGIOGRAPHY;  Surgeon: Kathleene Hazel, MD;  Location: [facility] INVASIVE CV LAB;  Service: Cardiovascular;  Laterality: N/A;   NM MYOCAR PERF WALL MOTION  08/15/04   No ischemia   RETINAL DETACHMENT SURGERY Bilateral    remains with slight hazy vision with lights   RIGHT/LEFT HEART CATH AND CORONARY ANGIOGRAPHY N/A 07/11/2020   Procedure: RIGHT/LEFT HEART CATH AND CORONARY ANGIOGRAPHY;  Surgeon: Tonny Bollman, MD;  [facility] INVASIVE CV LAB;  Service: Cardiovascular;  Laterality: N/A;   TOTAL HIP ARTHROPLASTY Right 08/04/2014   Procedure: RIGHT TOTAL HIP ARTHROPLASTY ANTERIOR APPROACH;  Surgeon: Loanne Drilling, MD;  Location: [facility] ORS;  Service: Orthopedics;  Laterality: Right;   TOTAL HIP ARTHROPLASTY Left 01/05/2015   Procedure: LEFT TOTAL HIP ARTHROPLASTY ANTERIOR APPROACH;  Surgeon: Loanne Drilling,  MD;  Location: [facility] ORS;  Service: Orthopedics;  Laterality: Left;   US ECHOCARDIOGRAPHY  06/25/2012   Moderate ASH,LV hyperdynamic,LA is mod. dilated,trace MR,TR,AI     Social History:   reports that she has quit smoking. She has never used smokeless tobacco. She reports that she does not drink alcohol and does not use drugs.   Family History:  Her family history includes CVA in her mother; Cancer in her father; Diabetes in an other family member; Esophageal cancer in an other family member; Hypertension in her mother; Lung cancer (age of onset: 10) in her father. There is no history of Colon cancer, Pancreatic cancer, Kidney disease, or Liver disease.   Allergies Allergies  Allergen Reactions   Codeine Nausea Only   Kenalog [Triamcinolone Acetonide] Other (See Comments)    Had A.fib after she received the injection."Steroid meds"   Relafen [Nabumetone] Other (See Comments)    Edema    Epinephrine Palpitations   Penicillins Rash    40 years ago, injection site was red and swollen.  rash, facial/tongue/throat swelling, SOB or lightheadedness with hypotension: Yes Has patient had a PCN reaction causing immediate  Has patient had a PCN reaction causing severe rash involving mucus membranes or skin necrosis: NO Has patient had a PCN reaction that required hospitalization. No  Has patient had a PCN reaction occurring within the last 10 years: No If all of the above answers are "NO", then may proceed with Cephalosporin use.     Tramadol Other (See Comments)    Auditory halllucinations     Home Medications  Prior to Admission medications   Medication Sig Start Date End Date Taking? Authorizing Provider  acetaminophen (TYLENOL) 500 MG tablet Take 1,000 mg by mouth every 6 (six) hours as needed for moderate pain or headache.   Yes [provider]  albuterol (VENTOLIN HFA) 108 (90 Base) MCG/ACT inhaler Inhale 2 puffs into the lungs every 4 (four) hours as needed for wheezing or  shortness of breath.  12/21/19  Yes [provider]  alum & mag hydroxide-simeth (MAALOX/MYLANTA) 200-200-20 MG/5ML suspension Take 30 mLs by mouth every 6 (six) hours as needed for indigestion or heartburn.   Yes [provider]  aspirin EC 81 MG tablet Take 81 mg by mouth daily. Swallow whole.   Yes [provider]  cholecalciferol (VITAMIN D3) 25 MCG (1000 UNIT) tablet Take 1,000 Units by mouth 2 (two) times daily.   Yes [provider]  CINNAMON PO Take 1 capsule by mouth in the morning and at bedtime.   Yes  [provider]  CRESTOR 20 MG tablet TAKE 1 TABLET(20 MG) BY MOUTH DAILY Patient taking differently: Take 20 mg by mouth daily. 09/19/21  Yes Minus Breeding, MD  furosemide (LASIX) 40 MG tablet Take 1 tablet (40 mg total) by mouth daily. 06/22/21  Yes Minus Breeding, MD  loteprednol (LOTEMAX) 0.5 % ophthalmic suspension Place 1 drop into both eyes daily as needed (imflammation).    Yes [provider]  metoprolol succinate (TOPROL XL) 50 MG 24 hr tablet TAKE 1 TAKE BY MOUTH EVERY MORNING AND 1/2 TABLET EVERY EVENING Patient taking differently: Take 25-50 mg by mouth See admin instructions. 50 mg in the morning 25 mg in the evening 06/30/21  Yes Hochrein, Jeneen Rinks, MD  Multiple Vitamin (MULTIVITAMIN) tablet Take 1 tablet by mouth daily.   Yes [provider]  Multiple Vitamins-Minerals (ZINC PO) Take 1 tablet by mouth daily.   Yes [provider]  nitroGLYCERIN (NITROSTAT) 0.4 MG SL tablet Place 1 tablet (0.4 mg total) under the tongue every 5 (five) minutes as needed for chest pain. 05/07/16  Yes Burtis Junes, NP  omeprazole (PRILOSEC) 10 MG capsule Take 10 mg by mouth daily.   Yes [provider]  ezetimibe (ZETIA) 10 MG tablet Take 1 tablet (10 mg total) by mouth daily. Patient not taking: Reported on 11/22/2021 03/31/21 08/08/21  Minus Breeding, MD  umeclidinium-vilanterol [facility] ELLIPTA) 62.5-25 MCG/ACT AEPB  Inhale 1 puff into the lungs daily.    [provider]     Critical care time: 35 minutes    CRITICAL CARE Performed by: Otilio Carpen Boots [place]   Total critical care time: 35 minutes  Critical care time was exclusive of separately billable procedures and treating other patients.  Critical care was necessary to treat or prevent imminent or life-threatening deterioration.  Critical care was time spent personally by me on the following activities: development of treatment plan with patient and/or surrogate as well as nursing, discussions with consultants, evaluation of patient's response to treatment, examination of patient, obtaining history from patient or surrogate, ordering and performing treatments and interventions, ordering and review of laboratory studies, ordering and review of radiographic studies, pulse oximetry and re-evaluation of patient's condition.  Otilio Carpen Athan Casalino, PA-C [facility] See Amion for pager If no response to pager , please call [phone] until 7pm After 7:00 pm call Elink  [phone]?[place]

## 2021-11-24 NOTE — Progress Notes (Signed)
Progress Note  Patient Name: Victoria Delgado Date of Encounter: 11/24/2021  Primary Cardiologist:   Rollene Rotunda, MD   Subjective   Worsening respiratory status last night.  Required diuresis and BiPAP.  Now on [place].  Feels better.     Inpatient Medications    Scheduled Meds:  adenosine       aspirin EC  81 mg Oral Daily   Chlorhexidine Gluconate Cloth  6 each Topical Q0600   ezetimibe  10 mg Oral Daily   furosemide       furosemide       insulin aspart  0-15 Units Subcutaneous Q4H   methylPREDNISolone (SOLU-MEDROL) injection  0.5 mg/kg Intravenous Q12H   Followed by   Melene Muller ON 11/27/2021] predniSONE  50 mg Oral Daily   metoprolol tartrate  50 mg Oral BID   pantoprazole  40 mg Oral Daily   rosuvastatin  20 mg Oral Daily   Continuous Infusions:  sodium chloride     amiodarone 30 mg/hr (11/24/21 0800)   heparin 1,100 Units/hr (11/24/21 0800)   remdesivir 100 mg in NS 100 mL     PRN Meds: Place/Maintain arterial line **AND** sodium chloride, acetaminophen **OR** acetaminophen   Vital Signs    Vitals:   11/24/21 0600 11/24/21 0700 11/24/21 0800 11/24/21 0801  BP: 96/66 118/81  121/67  Pulse: (!) 116 (!) 112 (!) 119 (!) 122  Resp: (!) 29 (!) 28 (!) 30 (!) 28  Temp:   98.5 F (36.9 C)   TempSrc:   Oral   SpO2: 96% 97% 98% 99%  Weight:      Height:        Intake/Output Summary (Last 24 hours) at 11/24/2021 0916 Last data filed at 11/24/2021 0800 Gross per 24 hour  Intake 1064.51 ml  Output 1150 ml  Net -85.49 ml   Filed Weights   11/23/21 0740  Weight: 127 kg    Telemetry    Atrial fib with RVR at times - Personally Reviewed  ECG    NA - Personally Reviewed  Physical Exam   GEN: No acute distress.   Neck: No  JVD Cardiac: Irregular RR, 2/6 apical systolic murmur, no diastolic murmurs, rubs, or gallops.  Respiratory:      Decreased breath sounds bilaterally with few basilar crackles.  GI: Soft, nontender, non-distended  MS:    Trace edema; No  deformity. Neuro:  Nonfocal  Psych: Normal affect   Labs    Chemistry Recent Labs  Lab 11/23/21 0606 11/24/21 0051 11/24/21 0325  NA 132* 134* 136  K 3.8 4.7 4.5  CL 104 102 103  CO2 21* 24 21*  GLUCOSE 163* 195* 188*  BUN CREATININE 0.99 1.03* 1.03*  CALCIUM 7.7* 8.2* 8.0*  PROT 5.5* 6.0* 6.3*  ALBUMIN 2.9* 3.1* 3.3*  AST 42* 57* 64*  ALT ALKPHOS 49 60 54  BILITOT 0.8 0.8 0.8  GFRNONAA 58* 56* 56*  ANIONGAP Hematology Recent Labs  Lab 11/23/21 0606 11/24/21 0051 11/24/21 0325  WBC 4.2 7.9 8.8  RBC 4.07 4.43 4.66  HGB 12.5 13.3 14.2  HCT 37.7 41.1 43.3  MCV 92.6 92.8 92.9  MCH 30.7 30.0 30.5  MCHC 33.2 32.4 32.8  RDW 15.0 15.0 15.2  PLT 162 202 226    Cardiac EnzymesNo results for input(s): TROPONINI in the last 168 hours. No results for input(s): TROPIPOC in the last 168 hours.  BNP Recent Labs  Lab 11/24/21 0051  BNP 609.0*     DDimer  Recent Labs  Lab 11/23/21 0606 11/24/21 0051  DDIMER 0.60* 0.43     Radiology    DG CHEST PORT 1 VIEW  Result Date: 11/24/2021 CLINICAL DATA:  Respiratory distress. EXAM: PORTABLE CHEST 1 VIEW COMPARISON:  November 23, 2021 FINDINGS: Mild, diffusely increased interstitial lung markings are seen. Moderate severity areas of atelectasis and/or infiltrate are noted within the bilateral lung bases. There is no evidence of a pleural effusion or pneumothorax. The cardiac silhouette is mildly enlarged and unchanged in size. Marked severity calcification of the aortic arch is noted. The visualized skeletal structures are unremarkable. IMPRESSION: Mild interstitial edema with moderate severity bibasilar atelectasis and/or infiltrate. Electronically Signed   By: Aram Candela M.D.   On: 11/24/2021 02:36   DG Chest Port 1 View  Result Date: 11/23/2021 CLINICAL DATA:  Chest pain. EXAM: PORTABLE CHEST 1 VIEW COMPARISON:  June 09, 2019 FINDINGS: Mild, chronic appearing increased lung  markings are seen with mild bibasilar atelectasis. There is no evidence of a pleural effusion or pneumothorax. The cardiac silhouette is enlarged and unchanged in size. An artificial right aortic valve is seen. There is marked severity calcification of the aortic arch. Degenerative changes are seen throughout the thoracic spine. IMPRESSION: Chronic appearing increased lung markings with mild bibasilar atelectasis. Electronically Signed   By: Aram Candela M.D.   On: 11/23/2021 00:41   ECHOCARDIOGRAM LIMITED  Result Date: 11/24/2021    ECHOCARDIOGRAM LIMITED REPORT   Patient Name:   Victoria Delgado Date of Exam: 11/24/2021 Medical Rec #:  [id number]     Height:       68.0 in Accession #:    [id number]    Weight:       280.0 lb Date of Birth:  Apr 30, 1941    BSA:          2.358 m Patient Age:    80 years      BP:           86/69 mmHg Patient Gender: F             HR:           115 bpm. Exam Location:  Inpatient Procedure: Limited Echo, Cardiac Doppler and Color Doppler Indications:    Elevated Troponin  History:        Patient has prior history of Echocardiogram examinations, most                 recent 05/16/2020. CAD, Arrythmias:Atrial Fibrillation; Risk                 Factors:Hypertension.                 Aortic Valve: 23 mm Sapien prosthetic, stented (TAVR) valve is                 present in the aortic position.  Sonographer:    Vanetta Shawl Referring Phys: [id number] IMPRESSIONS  1. Left ventricular ejection fraction, by estimation, is 65 to 70%. The left ventricle has normal function. The left ventricle has no regional wall motion abnormalities. There is moderate concentric left ventricular hypertrophy. Left ventricular diastolic function could not be evaluated.  2. Right ventricular systolic function is normal. The right ventricular size is normal. Tricuspid regurgitation signal is inadequate for assessing PA pressure.  3. Left atrial size was severely dilated.  4. No evidence of mitral  valve  regurgitation. Moderate mitral stenosis. The mean mitral valve gradient is 10.0 mmHg. Severe mitral annular calcification.  5. The aortic valve has been repaired/replaced. There is a 23 mm Sapien prosthetic (TAVR) valve present in the aortic position. Echo findings are consistent with normal structure and function of the aortic valve prosthesis. Aortic valve mean gradient measures 12.7 mmHg. Aortic valve Vmax measures 2.50 m/s.  6. The inferior vena cava is dilated in size with >50% respiratory variability, suggesting right atrial pressure of 8 mmHg. Comparison(s): Mitral stenosis gradients are higher, mostly due to atrial fibrillation with rapid ventricular rate. There is a new TAVR that appears to have normal function, baseline gradients are not available for comparison in our system, but are lower  than reported in 11/03/2021 echo from [place]. FINDINGS  Left Ventricle: Left ventricular ejection fraction, by estimation, is 65 to 70%. The left ventricle has normal function. The left ventricle has no regional wall motion abnormalities. The left ventricular internal cavity size was normal in size. There is  moderate concentric left ventricular hypertrophy. Left ventricular diastolic function could not be evaluated. Left ventricular diastolic function could not be evaluated due to atrial fibrillation. Right Ventricle: The right ventricular size is normal. No increase in right ventricular wall thickness. Right ventricular systolic function is normal. Tricuspid regurgitation signal is inadequate for assessing PA pressure. Left Atrium: Left atrial size was severely dilated. Right Atrium: Right atrial size was normal in size. Pericardium: There is no evidence of pericardial effusion. Mitral Valve: Severe mitral annular calcification. Moderate mitral valve stenosis. MV peak gradient, 22.1 mmHg. The mean mitral valve gradient is 10.0 mmHg with average heart rate of 117 bpm. Tricuspid Valve: The tricuspid valve is grossly  normal. Tricuspid valve regurgitation is not demonstrated. Aortic Valve: The aortic valve has been repaired/replaced. Aortic valve mean gradient measures 12.7 mmHg. Aortic valve peak gradient measures 24.9 mmHg. Aortic valve area, by VTI measures 1.02 cm. There is a 23 mm Sapien prosthetic, stented (TAVR) valve  present in the aortic position. Echo findings are consistent with normal structure and function of the aortic valve prosthesis. Pulmonic Valve: The pulmonic valve was grossly normal. Pulmonic valve regurgitation is not visualized. Aorta: The aortic root is normal in size and structure. Venous: The inferior vena cava is dilated in size with greater than 50% respiratory variability, suggesting right atrial pressure of 8 mmHg. IAS/Shunts: The interatrial septum was not well visualized. LEFT VENTRICLE PLAX 2D LVIDd:         4.70 cm LVIDs:         3.50 cm LV PW:         1.50 cm LV IVS:        1.40 cm LVOT diam:     2.00 cm LV SV:         47 LV SV Index:   20 LVOT Area:     3.14 cm  LEFT ATRIUM         Index LA diam:    5.20 cm 2.21 cm/m  AORTIC VALVE AV Area (Vmax):    1.31 cm AV Area (Vmean):   1.34 cm AV Area (VTI):     1.02 cm AV Vmax:           249.67 cm/s AV Vmean:          164.667 cm/s AV VTI:            0.465 m AV Peak Grad:      24.9 mmHg AV Mean  Grad:      12.7 mmHg LVOT Vmax:         104.00 cm/s LVOT Vmean:        70.250 cm/s LVOT VTI:          0.151 m LVOT/AV VTI ratio: 0.32 MITRAL VALVE MV Area (PHT): 1.97 cm    SHUNTS MV Peak grad:  22.1 mmHg   Systemic VTI:  0.15 m MV Mean grad:  10.0 mmHg   Systemic Diam: 2.00 cm MV Vmax:       2.35 m/s MV Vmean:      143.0 cm/s MV Decel Time: 386 msec Mihai Croitoru MD Electronically signed by Thurmon Fair MD Signature Date/Time: 11/24/2021/9:12:50 AM    Final     Cardiac Studies   Echo:    1. Left ventricular ejection fraction, by estimation, is 65 to 70%. The  left ventricle has normal function. The left ventricle has no regional  wall motion  abnormalities. There is moderate concentric left ventricular  hypertrophy. Left ventricular  diastolic function could not be evaluated.   2. Right ventricular systolic function is normal. The right ventricular  size is normal. Tricuspid regurgitation signal is inadequate for assessing  PA pressure.   3. Left atrial size was severely dilated.   4. No evidence of mitral valve regurgitation. Moderate mitral stenosis.  The mean mitral valve gradient is 10.0 mmHg. Severe mitral annular  calcification.   5. The aortic valve has been repaired/replaced. There is a 23 mm Sapien  prosthetic (TAVR) valve present in the aortic position. Echo findings are  consistent with normal structure and function of the aortic valve  prosthesis. Aortic valve mean gradient  measures 12.7 mmHg. Aortic valve Vmax measures 2.50 m/s.   6. The inferior vena cava is dilated in size with >50% respiratory  variability, suggesting right atrial pressure of 8 mmHg.   Comparison(s): Mitral stenosis gradients are higher, mostly due to atrial  fibrillation with rapid ventricular rate. There is a new TAVR that appears  to have normal function, baseline gradients are not available for  comparison in our system, but are lower   than reported in 11/03/2021 echo from [place].   FINDINGS   Left Ventricle: Left ventricular ejection fraction, by estimation, is 65  to 70%. The left ventricle has normal function. The left ventricle has no  regional wall motion abnormalities. The left ventricular internal cavity  size was normal in size. There is   moderate concentric left ventricular hypertrophy. Left ventricular  diastolic function could not be evaluated. Left ventricular diastolic  function could not be evaluated due to atrial fibrillation.   Right Ventricle: The right ventricular size is normal. No increase in  right ventricular wall thickness. Right ventricular systolic function is  normal. Tricuspid regurgitation signal is  inadequate for assessing PA  pressure.   Patient Profile     80 y.o. 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 Jarvis Newcomer.  Assessment & Plan    ELEVATED TROPONIN:  Elevated but flat.  No  new LV wall motion abnormalities.  I am not, at this time, considering invasive study.  I suspect this is demand ischemia.     ACUTE DIASTOLIC HF:  She was given IV fluid in the ED and has been treated now with diuresis and has had improvement.  ATRIAL FIB:  Rate is elevated but down from the peak.  Avoiding DCCV if she converts or is rate controlled.  I would suggest continue the IV amiodarone today.  I agree with the beta blocker.   I agree with switching to Eliquis.  She ultimately might need DCCV which I would suggest as an out patient and then consideration of [place] eventually.    HYPOTENSION:  Weened neo   COVID:  Per primary team.      For questions or updates, please contact [facility] Please consult [email or web address] for contact info under Cardiology/STEMI.   Signed, Rollene Rotunda, MD  11/24/2021, 9:16 AM

## 2021-11-24 NOTE — Progress Notes (Signed)
Came to room for BIPAP check, noted pt was on HFNC.  Per RN, pt just got up to go to the commode and was placed on HFNC.  Pt states her breathing is "fine" currently, no distress noted, but some tachypnea.  Pt is aware to notify RN if she starts to feel more SOB or "tired".

## 2021-11-24 NOTE — Significant Event (Signed)
Rapid Response Event Note   Reason for Call :  2nd set of eyes for pt admitted for Afib RVR and subsequently found to be Covid positive.   Pt bolused with Amiod 150mg  and 1L NS started PTA RRT.   Initial Focused Assessment:  Pt sitting on side of bed in tripod position, in respiratory distress, +WOB, +accessory muscle use. Pt c/o SOB and pain radiating to L arm. Lungs diminished t/o. Skin cool/clammy  HR-150s, BP-104/65, RR-40, SpO2-92% on 5L [place].   Interventions:  HFNC([place])-15L-SpO2 increased to 98% and SOB better EKG-Afib RVR PCXR-Mild interstitial edema with moderate severity bibasilar atelectasis and/or infiltrate. CBC/BMP/BNP/Trop/CRP/LA/D-dimer Tx to ICU per Cards MD: 60mg  Lasix IV(given prior to txing to 2H) Solumedrol Neo if needed to support BP A-line for accurate BP readings. PCCM consult  Plan of Care:  Tx to 2H07   Event Summary:   MD Notified: Dr. notified and came to bedside, Dr. 05-25[birth year]1970 notified and came to bedside. PCCM consulted.  Call Time:0117 Arrival Time:0130 End Julian Reil  Julianne Handler, RN

## 2021-11-24 NOTE — Progress Notes (Signed)
eLink Physician-Brief Progress Note Patient Name: Victoria Delgado DOB: [birth year] MRN: [id number]   Date of Service  11/24/2021  HPI/Events of Note  Pt c/o chest pain, sharp.  Per RN, denies SOB or difficulty breathing.  152/121 (131) HR 88 afib.  On amio gtt.  No pain meds ordered.  RN asking for you to assess  Cardiac enzymes without exponential elevation. Heparin has been switched to apixaban. Still on amiodarone drip  eICU Interventions  On questioning, chest pain is bilateral and she states its almost like her heartburn like pain which she takes Maalox/Mylanta. Ive reordered this and will monitor.     Intervention Category Intermediate Interventions: Pain - evaluation and management  Darl Pikes 11/24/2021, 8:46 PM

## 2021-11-24 NOTE — Progress Notes (Signed)
Cardiology progress note Victoria Delgado. Victoria Delgado [id number] 11/24/21  S: Asked to evaluate at bedside by primary hospitalist given worsening respiratory status, blood pressure.  RRT nurse had already evaluated the patient given worsening respiratory status and blood pressure following amiodarone bolus and fluid bolus in the setting of low blood pressure earlier this morning.  62F with CAD with percutaneous revascularization prior to recent TAVR, pAF, morbid obesity, AF C2V6 not on anticoagulation due to UGIB presented in AF RVR with underlying COVID19  yesterday.  Started on heparin and amiodarone infusion in consultation today.  Started on HFNC with improvement in symptoms though visibly dyspneic with poor air movement.  She is having some left arm discomfort but overall felt improved with supplemental oxygen.  O: BP (!) 127/91    Pulse (!) 156    Temp 98.5 F (36.9 C) (Oral)    Resp (!) 39    Ht 5\' 8"  (1.727 m)    Wt 127 kg    SpO2 98%    BMI 42.57 kg/m  General: Obese female, visibly dyspneic but not in acute distress CV: Irregularly irregular, tachycardic, no MGR appreicated Lungs: Diffuse end expiratory wheezes, some crackles LLL, poor air movement (some component of OHS suspected) Abd: Soft, nontender Extremities: Cool lower extremities but robust radial pulse.  1+ edema, length dependent.  ECG AF RVR no significant ST segment deviation, poor r-wave progression primarily lead placement suspected  CXR IMPRESSION: Mild interstitial edema with moderate severity bibasilar atelectasis and/or infiltrate.   Labs K 4.7, Cr 1.03 stable, BNP 609, troponin 2880 --> 2772 from 2900 earlier today  Assessment 43F admitted with COVID19 complicated by atrial fibrillation with RVR and demand ischemia.  Troponin is flat and increased in the setting of increased demand from systemic illness, COVID19.  Some component of heart failure with elevated BNP and cephalization/infiltrate on CXR.  Plan - Transfer to  ICU for respiratory status - Invasive blood pressure to confirm hypotension.  If low, phenylephrine titrated to MAP 65 - Lasix 60 mg IV x 1 and re-evaluate - Continue amiodarone infusion.  Higher threshold for cardioversion given long period off anticoagulation with unclear AF burden. - If unable to improve with management of COVID19, diuretics, then can reconsider DCCV in AM  C. 2773, MD Cardiology Fellow [facility]

## 2021-11-24 NOTE — Progress Notes (Addendum)
Pt now having increased SOB and WOB, tripoding in bed.  Stat CXR, BNP, trop, d.dimer, and CRP ordered.  DDx = worsening angina / NSTEMI symptoms / pulmonary edema vs worsening COVID.  Will go reassess pt.  On assessment, pt sitting on side of bed, having respiratory distress.  On ascultation diminished BS to left upper lobe compared to other fields.  CXR = official read is pending but clearly pt has worsening pulmonary opacification in the right lower lobe (in contrast to my ascultation exam) when compared to earlier CXR.  Dr. Julianne Handler at bedside as well.  A: Pt with worsening respiratory status either due to worsening NSTEMI / CHF and/or worsening COVID (not clear how much each contributing at this point:  P: 1) Dr. Julianne Handler transferring pt to ICU for phenylepherine. A) Asks that I start treatment for severe corona. B) 60 of lasix C) phenylephrine 2) Ill order steroids per pathway for severe coronavirus 3) Ill wait for CRP to come back before making decision on immunomodulator (barcitinib), currently pt was sating 92% on 5L 4) Pt already on Heparin gtt, while PE not entirely ruled out, this does seem less likely the cause of her worsening condition, also has clear CXR findings (of some sort) now. 5) Ill order SSI mod scale Q4H given steroids, NPO status 6) have consulted PCCM who will see pt.  CRITICAL CARE Performed by: Hillary Bow.   Total critical care time: 60 minutes  Critical care time was exclusive of separately billable procedures and treating other patients.  Critical care was necessary to treat or prevent imminent or life-threatening deterioration.  Critical care was time spent personally by me on the following activities: development of treatment plan with patient and/or surrogate as well as nursing, discussions with consultants, evaluation of patient's response to treatment, examination of patient, obtaining history from patient or surrogate, ordering and performing  treatments and interventions, ordering and review of laboratory studies, ordering and review of radiographic studies, pulse oximetry and re-evaluation of patient's condition.

## 2021-11-24 NOTE — Progress Notes (Signed)
eLink Physician-Brief Progress Note Patient Name: Victoria Delgado DOB: 11-Jun-1941 MRN: [id number]   Date of Service  11/24/2021  HPI/Events of Note  pt states she takes prilosec at home & cannot take protonix because it causes her to have stomach pain.  She has protonix ordered on MAR.  RN asking you to review & order substitution  eICU Interventions  Prilosec ordered non formulary. Discontinue Protonix once alternative available     Intervention Category Minor Interventions: Routine modifications to care plan (e.g. PRN medications for pain, fever)  Rosalie Gums Jerome Viglione 11/24/2021, 9:50 PM

## 2021-11-24 NOTE — Progress Notes (Signed)
°   11/24/21 0236  Assess: MEWS Score  BP (!) 89/42  Pulse Rate (!) 147  ECG Heart Rate (!) 149  Resp (!) 32  SpO2 97 %  O2 Device HFNC  O2 Flow Rate (L/min) 15 L/min  Assess: MEWS Score  MEWS Temp 0  MEWS Systolic 1  MEWS Pulse 3  MEWS RR 2  MEWS LOC 0  MEWS Score 6  MEWS Score Color Red  Assess: if the MEWS score is Yellow or Red  Were vital signs taken at a resting state? Yes  Focused Assessment No change from prior assessment  Early Detection of Sepsis Score *See Row Information* Low  MEWS guidelines implemented *See Row Information* Yes  Treat  Pain Scale 0-10  Pain Score 0  Pain Type Acute pain  Pain Location Arm  Pain Orientation Left  Pain Descriptors / Indicators Aching (feels like when she had her last MI)  Pain Frequency Constant  Pain Onset On-going  Patients Stated Pain Goal 0  Multiple Pain Sites No  Complains of Shortness of breath  Interventions Other (comment) (increased O2 to 15L HFNC)  Patients response to intervention Decreased  Escalate  MEWS: Escalate Red: discuss with charge nurse/RN and provider, consider discussing with RRT  Notify: Charge Nurse/RN  Name of Charge Nurse/RN Notified Posey Boyer, RN  Date Charge Nurse/RN Notified 11/23/21  Time Charge Nurse/RN Notified 2344  Notify: Provider  Provider Name/Title Lyda Perone, DO  Date Provider Notified 11/24/21  Time Provider Notified 0112  Notification Type Page  Notification Reason Change in status  Provider response En route  Date of Provider Response 11/24/21  Time of Provider Response 0115  Notify: Rapid Response  Name of Rapid Response RN Notified Mindy, RN  Date Rapid Response Notified 11/24/21  Time Rapid Response Notified 0135  Document  Patient Outcome Transferred/level of care increased  Progress note created (see row info) Yes   Husband at bedside and notified of changes in status and treatment plan.

## 2021-11-24 NOTE — Progress Notes (Signed)
eLink Physician-Brief Progress Note Patient Name: MALIJAH LIETZ DOB: 12-30-1940 MRN: [id number]   Date of Service  11/24/2021  HPI/Events of Note  64 F history of paroxysmal afib not on anticoagulation due to GI bleed, s/p TAVR, CAD s/p PCI, hypertension, dyslipidemia presented with palpitations. Noted to be in afib RVR started on cardizem but became hypotensive. Cardiology consulted and was started on amiodarone and heparin drip. COVID positive.  Transferred to the ICU overnight due to respiratory distress placed on heated high flow and diuresed with improvement in symptoms.  eICU Interventions  COVID 19 on Remdesevir and prednisone Afib RVR on amiodarone and heparin drip     Intervention Category Evaluation Type: New Patient Evaluation  Darl Pikes 11/24/2021, 3:27 AM

## 2021-11-25 DIAGNOSIS — U071 COVID-19: Secondary | ICD-10-CM | POA: Diagnosis not present

## 2021-11-25 LAB — COMPREHENSIVE METABOLIC PANEL
ALT: 21 U/L (ref 0–44)
AST: 39 U/L (ref 15–41)
Albumin: 2.8 g/dL — ABNORMAL LOW (ref 3.5–5.0)
Alkaline Phosphatase: 50 U/L (ref 38–126)
Anion gap: 6 (ref 5–15)
BUN: 14 mg/dL (ref 8–23)
CO2: 28 mmol/L (ref 22–32)
Calcium: 8.2 mg/dL — ABNORMAL LOW (ref 8.9–10.3)
Chloride: 102 mmol/L (ref 98–111)
Creatinine, Ser: 0.92 mg/dL (ref 0.44–1.00)
GFR, Estimated: >60 mL/min (ref >60)
Glucose, Bld: 226 mg/dL — ABNORMAL HIGH (ref 70–99)
Potassium: 4.5 mmol/L (ref 3.5–5.1)
Sodium: 136 mmol/L (ref 135–145)
Total Bilirubin: 0.5 mg/dL (ref 0.3–1.2)
Total Protein: 5.6 g/dL — ABNORMAL LOW (ref 6.5–8.1)

## 2021-11-25 LAB — CBC WITH DIFFERENTIAL/PLATELET
Abs Immature Granulocytes: 0.02 10*3/uL (ref 0.00–0.07)
Basophils Absolute: 0 10*3/uL (ref 0.0–0.1)
Basophils Relative: 0 %
Eosinophils Absolute: 0 10*3/uL (ref 0.0–0.5)
Eosinophils Relative: 0 %
HCT: 37.8 % (ref 36.0–46.0)
Hemoglobin: 12.4 g/dL (ref 12.0–15.0)
Immature Granulocytes: 0 %
Lymphocytes Relative: 12 %
Lymphs Abs: 0.7 10*3/uL (ref 0.7–4.0)
MCH: 30.3 pg (ref 26.0–34.0)
MCHC: 32.8 g/dL (ref 30.0–36.0)
MCV: 92.4 fL (ref 80.0–100.0)
Monocytes Absolute: 0.3 10*3/uL (ref 0.1–1.0)
Monocytes Relative: 5 %
Neutro Abs: 4.9 10*3/uL (ref 1.7–7.7)
Neutrophils Relative %: 83 %
Platelets: 176 10*3/uL (ref 150–400)
RBC: 4.09 MIL/uL (ref 3.87–5.11)
RDW: 14.8 % (ref 11.5–15.5)
WBC: 6 10*3/uL (ref 4.0–10.5)
nRBC: 0 % (ref 0.0–0.2)

## 2021-11-25 LAB — C-REACTIVE PROTEIN: CRP: 11.2 mg/dL — ABNORMAL HIGH (ref <1.0)

## 2021-11-25 LAB — GLUCOSE, CAPILLARY
Glucose-Capillary: 225 mg/dL — ABNORMAL HIGH (ref 70–99)
Glucose-Capillary: 210 mg/dL — ABNORMAL HIGH (ref 70–99)
Glucose-Capillary: 248 mg/dL — ABNORMAL HIGH (ref 70–99)

## 2021-11-25 MED ORDER — AMIODARONE HCL IN DEXTROSE 360-4.14 MG/200ML-% IV SOLN
60.0000 mg/h | INTRAVENOUS | Status: DC
  Administered 2021-11-25 – 2021-11-26 (×2): 30 mg/h via INTRAVENOUS
  Administered 2021-11-26 – 2021-11-27 (×3): 60 mg/h via INTRAVENOUS
  Filled 2021-11-25 (×5): qty 200

## 2021-11-25 MED ORDER — AMIODARONE HCL 200 MG PO TABS: 200.0000 mg | ORAL_TABLET | Freq: Two times a day (BID) | ORAL | Status: DC

## 2021-11-25 MED ORDER — DEXAMETHASONE 6 MG PO TABS
6.0000 mg | ORAL_TABLET | Freq: Every day | ORAL | Status: DC
  Administered 2021-11-25 – 2021-11-27 (×3): 6 mg via ORAL
  Filled 2021-11-25 (×4): qty 1

## 2021-11-25 MED ORDER — INSULIN ASPART 100 UNIT/ML IJ SOLN
0.0000 [IU] | Freq: Three times a day (TID) | INTRAMUSCULAR | Status: DC
  Administered 2021-11-25: 23:00:00 8 [IU] via SUBCUTANEOUS
  Administered 2021-11-26: 12:00:00 2 [IU] via SUBCUTANEOUS
  Administered 2021-11-26: 16:00:00 8 [IU] via SUBCUTANEOUS
  Administered 2021-11-26: 21:00:00 5 [IU] via SUBCUTANEOUS
  Administered 2021-11-26: 07:00:00 3 [IU] via SUBCUTANEOUS
  Administered 2021-11-27 (×3): 5 [IU] via SUBCUTANEOUS
  Administered 2021-11-27: 09:00:00 3 [IU] via SUBCUTANEOUS
  Administered 2021-11-28: 10:00:00 2 [IU] via SUBCUTANEOUS

## 2021-11-25 MED ORDER — SALINE SPRAY 0.65 % NA SOLN
1.0000 | NASAL | Status: DC | PRN
  Filled 2021-11-25: qty 44

## 2021-11-25 MED ORDER — PHENOL 1.4 % MT LIQD
1.0000 | OROMUCOSAL | Status: DC | PRN
  Filled 2021-11-25 (×2): qty 177

## 2021-11-25 MED ORDER — GLIPIZIDE 2.5 MG HALF TABLET: 2.5000 mg | ORAL_TABLET | Freq: Every day | ORAL | Status: DC

## 2021-11-25 MED ORDER — METFORMIN HCL 500 MG PO TABS
500.0000 mg | ORAL_TABLET | Freq: Two times a day (BID) | ORAL | Status: DC
  Administered 2021-11-25 – 2021-11-28 (×6): 500 mg via ORAL
  Filled 2021-11-25 (×6): qty 1

## 2021-11-25 MED ORDER — FUROSEMIDE 40 MG PO TABS
40.0000 mg | ORAL_TABLET | Freq: Every day | ORAL | Status: DC
  Administered 2021-11-25: 13:00:00 40 mg via ORAL
  Filled 2021-11-25 (×2): qty 1

## 2021-11-25 NOTE — Plan of Care (Signed)
  Problem: Clinical Measurements: Goal: Diagnostic test results will improve Outcome: Progressing Goal: Respiratory complications will improve Outcome: Progressing Goal: Cardiovascular complication will be avoided Outcome: Progressing   

## 2021-11-25 NOTE — Progress Notes (Signed)
Progress Note  Patient Name: Victoria Delgado Date of Encounter: 11/25/2021  Primary Cardiologist:   Rollene Rotunda, MD   Subjective   Off bipap. Breathing better. Remains in AF. On Eliquis. No bleeding. No CP.   Echo 65-70% RV ok. S/p TAVR   Inpatient Medications    Scheduled Meds:  amiodarone  200 mg Oral BID   apixaban  5 mg Oral BID   Chlorhexidine Gluconate Cloth  6 each Topical Q0600   dexamethasone  6 mg Oral Daily   furosemide  40 mg Oral Daily   insulin aspart  0-15 Units Subcutaneous Q4H   metFORMIN  500 mg Oral BID WC   metoprolol tartrate  50 mg Oral BID   omeprazole  20 mg Oral Daily   rosuvastatin  20 mg Oral Daily   Continuous Infusions:   PRN Meds: acetaminophen **OR** acetaminophen, alum & mag hydroxide-simeth   Vital Signs    Vitals:   11/25/21 1100 11/25/21 1116 11/25/21 1130 11/25/21 1200  BP: 139/71 123/76 133/77 135/75  Pulse: 78 72 66 90  Resp: (!) 23 (!) 26 19 (!) 28  Temp:  98 F (36.7 C)    TempSrc:  Oral    SpO2: 97% 96% 97% 91%  Weight:      Height:        Intake/Output Summary (Last 24 hours) at 11/25/2021 1234 Last data filed at 11/25/2021 1000 Gross per 24 hour  Intake 1163.83 ml  Output 800 ml  Net 363.83 ml    Filed Weights   11/23/21 0740 11/25/21 0615  Weight: 127 kg 128.1 kg    Telemetry    Atrial fib 60-70s Personally reviewed  Physical Exam   General:  Sitting up in bed No resp difficulty HEENT: normal Neck: supple. JVP 7-8 Carotids 2+ bilat; no bruits. No lymphadenopathy or thryomegaly appreciated. Cor: PMI nondisplaced. Irregular rate & rhythm. 2/6 SEM Lungs: clear Abdomen: obese soft, nontender, nondistended. No hepatosplenomegaly. No bruits or masses. Good bowel sounds. Extremities: no cyanosis, clubbing, rash, 1+ edema Neuro: alert & orientedx3, cranial nerves grossly intact. moves all 4 extremities w/o difficulty. Affect pleasant   Labs    Chemistry Recent Labs  Lab 11/24/21 0051  11/24/21 0325 11/25/21 0046  NA 134* 136 136  K 4.7 4.5 4.5  CL 102 103 102  CO2 24 21* 28  GLUCOSE 195* 188* 226*  BUN 13 12 14   CREATININE 1.03* 1.03* 0.92  CALCIUM 8.2* 8.0* 8.2*  PROT 6.0* 6.3* 5.6*  ALBUMIN 3.1* 3.3* 2.8*  AST 57* 64* 39  ALT 25 25 21   ALKPHOS 60 54 50  BILITOT 0.8 0.8 0.5  GFRNONAA 56* 56* >60  ANIONGAP 8 12 6       Hematology Recent Labs  Lab 11/24/21 0051 11/24/21 0325 11/25/21 0046  WBC 7.9 8.8 6.0  RBC 4.43 4.66 4.09  HGB 13.3 14.2 12.4  HCT 41.1 43.3 37.8  MCV 92.8 92.9 92.4  MCH 30.0 30.5 30.3  MCHC 32.4 32.8 32.8  RDW 15.0 15.2 14.8  PLT 202 226 176     Cardiac EnzymesNo results for input(s): TROPONINI in the last 168 hours. No results for input(s): TROPIPOC in the last 168 hours.   BNP Recent Labs  Lab 11/24/21 0051  BNP 609.0*      DDimer  Recent Labs  Lab 11/23/21 0606 11/24/21 0051  DDIMER 0.60* 0.43      Radiology    DG CHEST PORT 1 VIEW  Result Date: 11/24/2021  CLINICAL DATA:  Respiratory distress. EXAM: PORTABLE CHEST 1 VIEW COMPARISON:  November 23, 2021 FINDINGS: Mild, diffusely increased interstitial lung markings are seen. Moderate severity areas of atelectasis and/or infiltrate are noted within the bilateral lung bases. There is no evidence of a pleural effusion or pneumothorax. The cardiac silhouette is mildly enlarged and unchanged in size. Marked severity calcification of the aortic arch is noted. The visualized skeletal structures are unremarkable. IMPRESSION: Mild interstitial edema with moderate severity bibasilar atelectasis and/or infiltrate. Electronically Signed   By: Aram Candela M.D.   On: 11/24/2021 02:36   ECHOCARDIOGRAM LIMITED  Result Date: 11/24/2021    ECHOCARDIOGRAM LIMITED REPORT   Patient Name:   Victoria Delgado Date of Exam: 11/24/2021 Medical Rec #:  [id number]     Height:       68.0 in Accession #:    [id number]    Weight:       280.0 lb Date of Birth:  December 08, 1940    BSA:           2.358 m Patient Age:    80 years      BP:           86/69 mmHg Patient Gender: F             HR:           115 bpm. Exam Location:  Inpatient Procedure: Limited Echo, Cardiac Doppler and Color Doppler Indications:    Elevated Troponin  History:        Patient has prior history of Echocardiogram examinations, most                 recent 05/16/2020. CAD, Arrythmias:Atrial Fibrillation; Risk                 Factors:Hypertension.                 Aortic Valve: 23 mm Sapien prosthetic, stented (TAVR) valve is                 present in the aortic position.  Sonographer:    Vanetta Shawl Referring Phys: [id number] [address] Nine IMPRESSIONS  1. Left ventricular ejection fraction, by estimation, is 65 to 70%. The left ventricle has normal function. The left ventricle has no regional wall motion abnormalities. There is moderate concentric left ventricular hypertrophy. Left ventricular diastolic function could not be evaluated.  2. Right ventricular systolic function is normal. The right ventricular size is normal. Tricuspid regurgitation signal is inadequate for assessing PA pressure.  3. Left atrial size was severely dilated.  4. No evidence of mitral valve regurgitation. Moderate mitral stenosis. The mean mitral valve gradient is 10.0 mmHg. Severe mitral annular calcification.  5. The aortic valve has been repaired/replaced. There is a 23 mm Sapien prosthetic (TAVR) valve present in the aortic position. Echo findings are consistent with normal structure and function of the aortic valve prosthesis. Aortic valve mean gradient measures 12.7 mmHg. Aortic valve Vmax measures 2.50 m/s.  6. The inferior vena cava is dilated in size with >50% respiratory variability, suggesting right atrial pressure of 8 mmHg. Comparison(s): Mitral stenosis gradients are higher, mostly due to atrial fibrillation with rapid ventricular rate. There is a new TAVR that appears to have normal function, baseline gradients are not available for comparison in  our system, but are lower  than reported in 11/03/2021 echo from [place]. FINDINGS  Left Ventricle: Left ventricular ejection fraction, by estimation, is 65 to 70%. The left  ventricle has normal function. The left ventricle has no regional wall motion abnormalities. The left ventricular internal cavity size was normal in size. There is  moderate concentric left ventricular hypertrophy. Left ventricular diastolic function could not be evaluated. Left ventricular diastolic function could not be evaluated due to atrial fibrillation. Right Ventricle: The right ventricular size is normal. No increase in right ventricular wall thickness. Right ventricular systolic function is normal. Tricuspid regurgitation signal is inadequate for assessing PA pressure. Left Atrium: Left atrial size was severely dilated. Right Atrium: Right atrial size was normal in size. Pericardium: There is no evidence of pericardial effusion. Mitral Valve: Severe mitral annular calcification. Moderate mitral valve stenosis. MV peak gradient, 22.1 mmHg. The mean mitral valve gradient is 10.0 mmHg with average heart rate of 117 bpm. Tricuspid Valve: The tricuspid valve is grossly normal. Tricuspid valve regurgitation is not demonstrated. Aortic Valve: The aortic valve has been repaired/replaced. Aortic valve mean gradient measures 12.7 mmHg. Aortic valve peak gradient measures 24.9 mmHg. Aortic valve area, by VTI measures 1.02 cm. There is a 23 mm Sapien prosthetic, stented (TAVR) valve  present in the aortic position. Echo findings are consistent with normal structure and function of the aortic valve prosthesis. Pulmonic Valve: The pulmonic valve was grossly normal. Pulmonic valve regurgitation is not visualized. Aorta: The aortic root is normal in size and structure. Venous: The inferior vena cava is dilated in size with greater than 50% respiratory variability, suggesting right atrial pressure of 8 mmHg. IAS/Shunts: The interatrial septum was not well  visualized. LEFT VENTRICLE PLAX 2D LVIDd:         4.70 cm LVIDs:         3.50 cm LV PW:         1.50 cm LV IVS:        1.40 cm LVOT diam:     2.00 cm LV SV:         47 LV SV Index:   20 LVOT Area:     3.14 cm  LEFT ATRIUM         Index LA diam:    5.20 cm 2.21 cm/m  AORTIC VALVE AV Area (Vmax):    1.31 cm AV Area (Vmean):   1.34 cm AV Area (VTI):     1.02 cm AV Vmax:           249.67 cm/s AV Vmean:          164.667 cm/s AV VTI:            0.465 m AV Peak Grad:      24.9 mmHg AV Mean Grad:      12.7 mmHg LVOT Vmax:         104.00 cm/s LVOT Vmean:        70.250 cm/s LVOT VTI:          0.151 m LVOT/AV VTI ratio: 0.32 MITRAL VALVE MV Area (PHT): 1.97 cm    SHUNTS MV Peak grad:  22.1 mmHg   Systemic VTI:  0.15 m MV Mean grad:  10.0 mmHg   Systemic Diam: 2.00 cm MV Vmax:       2.35 m/s MV Vmean:      143.0 cm/s MV Decel Time: 386 msec Mihai Croitoru MD Electronically signed by Thurmon Fair MD Signature Date/Time: 11/24/2021/9:12:50 AM    Final     Cardiac Studies   Echo:    1. Left ventricular ejection fraction, by estimation, is 65 to 70%. The  left  ventricle has normal function. The left ventricle has no regional  wall motion abnormalities. There is moderate concentric left ventricular  hypertrophy. Left ventricular  diastolic function could not be evaluated.   2. Right ventricular systolic function is normal. The right ventricular  size is normal. Tricuspid regurgitation signal is inadequate for assessing  PA pressure.   3. Left atrial size was severely dilated.   4. No evidence of mitral valve regurgitation. Moderate mitral stenosis.  The mean mitral valve gradient is 10.0 mmHg. Severe mitral annular  calcification.   5. The aortic valve has been repaired/replaced. There is a 23 mm Sapien  prosthetic (TAVR) valve present in the aortic position. Echo findings are  consistent with normal structure and function of the aortic valve  prosthesis. Aortic valve mean gradient  measures 12.7 mmHg.  Aortic valve Vmax measures 2.50 m/s.   6. The inferior vena cava is dilated in size with >50% respiratory  variability, suggesting right atrial pressure of 8 mmHg.   Comparison(s): Mitral stenosis gradients are higher, mostly due to atrial  fibrillation with rapid ventricular rate. There is a new TAVR that appears  to have normal function, baseline gradients are not available for  comparison in our system, but are lower   than reported in 11/03/2021 echo from [place].   FINDINGS   Left Ventricle: Left ventricular ejection fraction, by estimation, is 65  to 70%. The left ventricle has normal function. The left ventricle has no  regional wall motion abnormalities. The left ventricular internal cavity  size was normal in size. There is   moderate concentric left ventricular hypertrophy. Left ventricular  diastolic function could not be evaluated. Left ventricular diastolic  function could not be evaluated due to atrial fibrillation.   Right Ventricle: The right ventricular size is normal. No increase in  right ventricular wall thickness. Right ventricular systolic function is  normal. Tricuspid regurgitation signal is inadequate for assessing PA  pressure.   Patient Profile     80 y.o. 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 Jarvis Newcomer.  Assessment & Plan    1. Paroxysmal AF with RVR - unclear duration - continue Eliquis  - now rate controlled on IV amio. Would continue while in house - Consider TEE/DC-CV prior to d/c  - has h/o GIB after TAVR. Can consider [facility] as outpatient  2. Elevated troponin:   - Elevated but flat.  No  new LV wall motion abnormalities. - cath 9/21 non-obstructive CAD  3.  ACUTE DIASTOLIC HF:   - She was given IV fluid in the ED and has been treated now with diuresis and has had improvement.    - continue  po lasix  4. COVID-19 infection with acute hypoxic respiratory failure - now off bipap - CCM managing  5. 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 DM2 - per primary team - with CAD, obesity and DM2 consider GLP1-RA   For questions or updates, please contact [facility] Please consult [email or web address] for contact info under Cardiology/STEMI.   Signed, Arvilla Meres, MD  11/25/2021, 12:34 PM

## 2021-11-25 NOTE — Consult Note (Signed)
NAME:  Victoria Delgado, MRN:  [id number], DOB:  [birth year], LOS: 2 ADMISSION DATE:  11/22/2021, CONSULTATION DATE:  11/25/21 REFERRING MD:  Alcario Drought, CHIEF COMPLAINT:  atrial fibrillation   History of Present Illness:  Victoria Delgado is a 80 y.o. F with PMH of paroxysmal atrial fibrillation not on anti-coagulation, CAD, HL, HTN, AVS with TAVR, HOCM, OSA and hepatitis who began developing a cough and shortness of breath around 12/19, several days later she presented to the ED secondary to palpitations and was in atrial fibrillation with RVR.  She tested positive for Covid-19, CXR without significant infiltrates and inflammatory markers only mildly elevated.  She was admitted and started on amiodarone and remdesevir.   Pt's heart rate remained poorly controlled and she complained of L shoulder pain. She was given IVF and then developed pulmonary edema and required HFNC.  Troponin peaked at 2,985 and cardiology was consulted.  She had intermittent hypotension with RVR and so PCCM consulted 12/23.  Pertinent  Medical History   has a past medical history of Abdominal pain, Arthritis, Asymmetric septal hypertrophy ([place]), Atrial fibrillation ([place]), CAD (coronary artery disease), Cataract, Fatty liver, Gallstones, GERD (gastroesophageal reflux disease), GERD (gastroesophageal reflux disease), Heart palpitations, Hepatitis, Hernia, hiatal, Hyperlipidemia, Hypertension, Obesity, and Sleep apnea.   Significant Hospital Events: Including procedures, antibiotic start and stop dates in addition to other pertinent events   12/22 admit to hospitalist service  12/23 intermittent hypotension with RVR, transfer to ICU and PCCM consult 12/24 weaning O2 and improved rate control.   Interim History / Subjective:   No specific complaints.   Objective   Blood pressure (!) 138/92, pulse 89, temperature 98 F (36.7 C), temperature source Oral, resp. rate 19, height 5\' 8"  (1.727 m), weight 128.1 kg, SpO2 91 %.         Intake/Output Summary (Last 24 hours) at 11/25/2021 1126 Last data filed at 11/25/2021 1000 Gross per 24 hour  Intake 1315.71 ml  Output 800 ml  Net 515.71 ml    Filed Weights   11/23/21 0740 11/25/21 0615  Weight: 127 kg 128.1 kg   General:  elderly F sitting up in bed mildly dyspneic but not in severe distress HEENT: MM pink/moist, sclera anicteric Neuro: awake, oriented and conversation without focal deficits CV: s1s2 tachycardic, irregular, no m/r/g PULM:  mildly decreased in the bilateral bases without wheezing or rhonchi, tachypneic without accessory muscle use  GI: soft, bsx4 active  Extremities: warm/dry, 2+ edema  Skin: no rashes or lesions   Ancillary tests personally reviewed:   CBG's in 200's HBA1C 6.8%  Assessment & Plan:   Atrial Fibrillation with RVR - now controlled.  NSTEMI Acute on chronic HF exacerbation Baseline HTN, CAD, AVS with TAVR, HFpEF Acute Hypoxic Respiratory insufficiency. Covid-19 + DM type 2 with Hba1c 6.8%  Plan:  - Stop amiodarone IV and ambulate - Continue metoprolol and Eliquis - may spontaneously convert - Wean O2 to off and ambulate - If ambulatory stats and HR are acceptable - can discharge home with expedited follow-up with Cards for possible cardioversion.  - Step steroids down to oral dexamethasone and complete 10 days.  - Resume home furosemide.  - Start metformin for T2DM and steroid-induced hyperglycemia.  Best Practice (right click and "Reselect all SmartList Selections" daily)   Diet/type: Regular consistency (see orders) DVT prophylaxis: Eliquis GI prophylaxis: N/A Lines: N/A Foley:  N/A Code Status:  full code Last date of multidisciplinary goals of care discussion - patient updated with husband at  the bedside.  Lynnell Catalan, MD [facility] ICU Physician [facility]  Pager: [phone] Or Epic Secure Chat After hours: [phone].  11/25/2021, 12:05 PM

## 2021-11-25 NOTE — Discharge Instructions (Addendum)
You have an appointment set up with the Atrial Fibrillation Clinic.  Multiple studies have shown that being followed by a dedicated atrial fibrillation clinic in addition to the standard care you receive from your other physicians improves health. We believe that enrollment in the atrial fibrillation clinic will allow [place] to better care for you.   The phone number to the Atrial Fibrillation Clinic is [phone]. The clinic is staffed Monday through Friday from 8:30am to 5pm.  Parking Directions: The clinic is located in the Heart and Vascular Building connected to [facility]. 1)From [address] turn on to [address] and go to the 3rd entrance  (Heart and Vascular entrance) on the right. 2)Look to the right for Heart &Vascular Parking Garage. 3)A code for the entrance is 1202    4)Take the elevators to the 1st floor. Registration is in the room with the glass walls at the end of the hallway.  If you have any trouble parking or locating the clinic, please dont hesitate to call [phone].      Information on my medicine - ELIQUIS (apixaban)  This medication education was reviewed with me or my healthcare representative as part of my discharge preparation.   Why was Eliquis prescribed for you? Eliquis was prescribed for you to reduce the risk of a blood clot forming that can cause a stroke if you have a medical condition called atrial fibrillation (a type of irregular heartbeat).  What do You need to know about Eliquis ? Take your Eliquis TWICE DAILY - one tablet in the morning and one tablet in the evening with or without food. If you have difficulty swallowing the tablet whole please discuss with your pharmacist how to take the medication safely.  Take Eliquis exactly as prescribed by your doctor and DO NOT stop taking Eliquis without talking to the doctor who prescribed the medication.  Stopping may increase your risk of developing a stroke.  Refill your  prescription before you run out.  After discharge, you should have regular check-up appointments with your healthcare provider that is prescribing your Eliquis.  In the future your dose may need to be changed if your kidney function or weight changes by a significant amount or as you get older.  What do you do if you miss a dose? If you miss a dose, take it as soon as you remember on the same day and resume taking twice daily.  Do not take more than one dose of ELIQUIS at the same time to make up a missed dose.  Important Safety Information A possible side effect of Eliquis is bleeding. You should call your healthcare provider right away if you experience any of the following: Bleeding from an injury or your nose that does not stop. Unusual colored urine (red or dark brown) or unusual colored stools (red or black). Unusual bruising for unknown reasons. A serious fall or if you hit your head (even if there is no bleeding).  Some medicines may interact with Eliquis and might increase your risk of bleeding or clotting while on Eliquis. To help avoid this, consult your healthcare provider or pharmacist prior to using any new prescription or non-prescription medications, including herbals, vitamins, non-steroidal anti-inflammatory drugs (NSAIDs) and supplements.  This website has more information on Eliquis (apixaban): http://www.eliquis.com/eliquis/home

## 2021-11-26 DIAGNOSIS — U071 COVID-19: Secondary | ICD-10-CM | POA: Diagnosis not present

## 2021-11-26 LAB — CBC WITH DIFFERENTIAL/PLATELET
Abs Immature Granulocytes: 0.07 10*3/uL (ref 0.00–0.07)
Basophils Absolute: 0 10*3/uL (ref 0.0–0.1)
Basophils Relative: 0 %
Eosinophils Absolute: 0 10*3/uL (ref 0.0–0.5)
Eosinophils Relative: 0 %
HCT: 39.1 % (ref 36.0–46.0)
Hemoglobin: 12.7 g/dL (ref 12.0–15.0)
Immature Granulocytes: 1 %
Lymphocytes Relative: 8 %
Lymphs Abs: 1 10*3/uL (ref 0.7–4.0)
MCH: 30.2 pg (ref 26.0–34.0)
MCHC: 32.5 g/dL (ref 30.0–36.0)
MCV: 92.9 fL (ref 80.0–100.0)
Monocytes Absolute: 0.5 10*3/uL (ref 0.1–1.0)
Monocytes Relative: 4 %
Neutro Abs: 11.4 10*3/uL — ABNORMAL HIGH (ref 1.7–7.7)
Neutrophils Relative %: 87 %
Platelets: 230 10*3/uL (ref 150–400)
RBC: 4.21 MIL/uL (ref 3.87–5.11)
RDW: 15 % (ref 11.5–15.5)
WBC: 13 10*3/uL — ABNORMAL HIGH (ref 4.0–10.5)
nRBC: 0.2 % (ref 0.0–0.2)

## 2021-11-26 LAB — GLUCOSE, CAPILLARY
Glucose-Capillary: 251 mg/dL — ABNORMAL HIGH (ref 70–99)
Glucose-Capillary: 182 mg/dL — ABNORMAL HIGH (ref 70–99)
Glucose-Capillary: 169 mg/dL — ABNORMAL HIGH (ref 70–99)
Glucose-Capillary: 258 mg/dL — ABNORMAL HIGH (ref 70–99)
Glucose-Capillary: 243 mg/dL — ABNORMAL HIGH (ref 70–99)

## 2021-11-26 LAB — COMPREHENSIVE METABOLIC PANEL
ALT: 30 U/L (ref 0–44)
AST: 41 U/L (ref 15–41)
Albumin: 2.9 g/dL — ABNORMAL LOW (ref 3.5–5.0)
Alkaline Phosphatase: 58 U/L (ref 38–126)
Anion gap: 9 (ref 5–15)
BUN: 18 mg/dL (ref 8–23)
CO2: 23 mmol/L (ref 22–32)
Calcium: 8.3 mg/dL — ABNORMAL LOW (ref 8.9–10.3)
Chloride: 103 mmol/L (ref 98–111)
Creatinine, Ser: 0.87 mg/dL (ref 0.44–1.00)
GFR, Estimated: >60 mL/min (ref >60)
Glucose, Bld: 244 mg/dL — ABNORMAL HIGH (ref 70–99)
Potassium: 4.5 mmol/L (ref 3.5–5.1)
Sodium: 135 mmol/L (ref 135–145)
Total Bilirubin: 0.7 mg/dL (ref 0.3–1.2)
Total Protein: 5.7 g/dL — ABNORMAL LOW (ref 6.5–8.1)

## 2021-11-26 LAB — C-REACTIVE PROTEIN: CRP: 5.6 mg/dL — ABNORMAL HIGH (ref <1.0)

## 2021-11-26 MED ORDER — AMIODARONE LOAD VIA INFUSION
150.0000 mg | Freq: Once | INTRAVENOUS | Status: AC
Start: 2021-11-26 — End: 2021-11-26
  Administered 2021-11-26: 14:00:00 150 mg via INTRAVENOUS
  Filled 2021-11-26: qty 83.34

## 2021-11-26 MED ORDER — FUROSEMIDE 40 MG PO TABS
40.0000 mg | ORAL_TABLET | Freq: Every day | ORAL | Status: DC
  Administered 2021-11-27 – 2021-11-28 (×2): 40 mg via ORAL
  Filled 2021-11-26 (×2): qty 1

## 2021-11-26 MED ORDER — FUROSEMIDE 10 MG/ML IJ SOLN
40.0000 mg | Freq: Once | INTRAMUSCULAR | Status: AC
  Administered 2021-11-26: 12:00:00 40 mg via INTRAVENOUS
  Filled 2021-11-26: qty 4

## 2021-11-26 NOTE — Consult Note (Signed)
NAME:  Victoria Delgado, MRN:  [id number], DOB:  [birth year], LOS: 3 ADMISSION DATE:  11/22/2021, CONSULTATION DATE:  11/26/21 REFERRING MD:  Julian Reil, CHIEF COMPLAINT:  atrial fibrillation   History of Present Illness:  Victoria Delgado is a 80 y.o. F with PMH of paroxysmal atrial fibrillation not on anti-coagulation, CAD, HL, HTN, AVS with TAVR, HOCM, OSA and hepatitis who began developing a cough and shortness of breath around 12/19, several days later she presented to the ED secondary to palpitations and was in atrial fibrillation with RVR.  She tested positive for Covid-19, CXR without significant infiltrates and inflammatory markers only mildly elevated.  She was admitted and started on amiodarone and remdesevir.   Pt's heart rate remained poorly controlled and she complained of L shoulder pain. She was given IVF and then developed pulmonary edema and required HFNC.  Troponin peaked at 2,985 and cardiology was consulted.  She had intermittent hypotension with RVR and so PCCM consulted 12/23.  Pertinent  Medical History   has a past medical history of Abdominal pain, Arthritis, Asymmetric septal hypertrophy ([facility]), Atrial fibrillation ([facility]), CAD (coronary artery disease), Cataract, Fatty liver, Gallstones, GERD (gastroesophageal reflux disease), GERD (gastroesophageal reflux disease), Heart palpitations, Hepatitis, Hernia, hiatal, Hyperlipidemia, Hypertension, Obesity, and Sleep apnea.   Significant Hospital Events: Including procedures, antibiotic start and stop dates in addition to other pertinent events   12/22 admit to hospitalist service  12/23 intermittent hypotension with RVR, transfer to ICU and PCCM consult 12/24 weaning O2 and improved rate control.   Interim History / Subjective:   Some diarrhea today.  Was started on metformin yesterday.  Dyspnea is improving.  Continues to have breakthrough rapid atrial fibrillation particularly with ambulation.  Objective   Blood pressure (!) 148/89,  pulse 83, temperature 98.1 F (36.7 C), temperature source Oral, resp. rate (!) 33, height 5\' 8"  (1.727 m), weight 127.3 kg, SpO2 95 %.        Intake/Output Summary (Last 24 hours) at 11/26/2021 1056 Last data filed at 11/26/2021 0400 Gross per 24 hour  Intake 652.67 ml  Output 1200 ml  Net -547.33 ml    Filed Weights   11/23/21 0740 11/25/21 0615 11/26/21 0500  Weight: 127 kg 128.1 kg 127.3 kg   General:  elderly F sitting up in bed mildly dyspneic but not in severe distress HEENT: MM pink/moist, sclera anicteric Neuro: awake, oriented and conversation without focal deficits CV: s1s2 tachycardic, irregular, no m/r/g PULM:  mildly decreased in the bilateral bases without wheezing or rhonchi, tachypneic without accessory muscle use  GI: soft, bsx4 active  Extremities: warm/dry, 2+ edema  Skin: no rashes or lesions   Ancillary tests personally reviewed:   CBG's in 200's HBA1C 6.8% Cardiogram shows normal LV function with moderate LVH.  Left atrium is severely dilated.  Assessment & Plan:   Atrial Fibrillation with RVR - now controlled.  NSTEMI Acute on chronic HF exacerbation Baseline HTN, CAD, AVS with TAVR, HFpEF Acute Hypoxic Respiratory insufficiency. Covid-19 + DM type 2 with Hba1c 6.8%  Plan:  -Continue IV amiodarone - Continue metoprolol and Eliquis - may spontaneously convert -Plan for TEE guided cardioversion if necessary 12/27.   -Remains on room air. - Step steroids down to oral dexamethasone and complete 10 days.  - Resume home furosemide.  Will give dose of IV furosemide today - Started metformin for T2DM and steroid-induced hyperglycemia.  Best Practice (right click and "Reselect all SmartList Selections" daily)   Diet/type: Regular consistency (see orders) DVT  prophylaxis: Eliquis GI prophylaxis: N/A Lines: N/A Foley:  N/A Code Status:  full code Last date of multidisciplinary goals of care discussion - patient updated with husband at the  bedside.  Lynnell Catalan, MD [facility] ICU Physician [facility] [place] Critical [facility]  Pager: [phone] Or Epic Secure Chat After hours: [phone].  11/26/2021, 10:56 AM

## 2021-11-26 NOTE — Progress Notes (Addendum)
Progress Note  Patient Name: Victoria Delgado Date of Encounter: 11/26/2021  Primary Cardiologist:   Rollene Rotunda, MD   Subjective   Dyspnea improving slowly. Denies CP, orthopnea or PND.  Remains in AF - rates 70-80s at rest. On iv amio and Eliquis.   Remdisivr stopped yesterday after 3 doses. On dexamethasone   Echo 65-70% RV ok. S/p TAVR   Inpatient Medications    Scheduled Meds:  apixaban  5 mg Oral BID   Chlorhexidine Gluconate Cloth  6 each Topical Q0600   dexamethasone  6 mg Oral Daily   furosemide  40 mg Intravenous Once   [START ON 11/27/2021] furosemide  40 mg Oral Daily   insulin aspart  0-15 Units Subcutaneous TID PC & HS   metFORMIN  500 mg Oral BID WC   metoprolol tartrate  50 mg Oral BID   omeprazole  20 mg Oral Daily   rosuvastatin  20 mg Oral Daily   Continuous Infusions:  amiodarone 30 mg/hr (11/26/21 0747)    PRN Meds: acetaminophen **OR** acetaminophen, alum & mag hydroxide-simeth, phenol, sodium chloride   Vital Signs    Vitals:   11/26/21 0200 11/26/21 0300 11/26/21 0400 11/26/21 0500  BP: 110/66 (!) 146/128 129/69 110/82  Pulse: 82     Resp: 19 (!) 21 (!) 26 (!) 23  Temp:   98.1 F (36.7 C)   TempSrc:   Oral   SpO2: 94% 93% 96%   Weight:    127.3 kg  Height:        Intake/Output Summary (Last 24 hours) at 11/26/2021 1016 Last data filed at 11/26/2021 0400 Gross per 24 hour  Intake 652.67 ml  Output 1200 ml  Net -547.33 ml    Filed Weights   11/23/21 0740 11/25/21 0615 11/26/21 0500  Weight: 127 kg 128.1 kg 127.3 kg    Telemetry    Atrial fib 70-80s Personally reviewed  Physical Exam   General:  Sitting up No resp difficulty HEENT: normal Neck: supple. JVP 8-9 Carotids 2+ bilat; no bruits. No lymphadenopathy or thryomegaly appreciated. Cor: PMI nondisplaced. Irregular rate & rhythm. No rubs, gallops or murmurs. Lungs: clear Abdomen: obese soft, nontender, nondistended. No hepatosplenomegaly. No bruits or masses.  Good bowel sounds. Extremities: no cyanosis, clubbing, rash, 1+ edema Neuro: alert & orientedx3, cranial nerves grossly intact. moves all 4 extremities w/o difficulty. Affect pleasant    Labs    Chemistry Recent Labs  Lab 11/24/21 0325 11/25/21 0046 11/26/21 0211  NA 136 136 135  K 4.5 4.5 4.5  CL 103 102 103  CO2 21* 28 23  GLUCOSE 188* 226* 244*  BUN 12 14 18   CREATININE 1.03* 0.92 0.87  CALCIUM 8.0* 8.2* 8.3*  PROT 6.3* 5.6* 5.7*  ALBUMIN 3.3* 2.8* 2.9*  AST 64* 39 41  ALT 25 21 30   ALKPHOS 54 50 58  BILITOT 0.8 0.5 0.7  GFRNONAA 56* >60 >60  ANIONGAP 12 6 9       Hematology Recent Labs  Lab 11/24/21 0325 11/25/21 0046 11/26/21 0211  WBC 8.8 6.0 13.0*  RBC 4.66 4.09 4.21  HGB 14.2 12.4 12.7  HCT 43.3 37.8 39.1  MCV 92.9 92.4 92.9  MCH 30.5 30.3 30.2  MCHC 32.8 32.8 32.5  RDW 15.2 14.8 15.0  PLT 226 176 230     Cardiac EnzymesNo results for input(s): TROPONINI in the last 168 hours. No results for input(s): TROPIPOC in the last 168 hours.   BNP Recent Labs  Lab 11/24/21 0051  BNP 609.0*      DDimer  Recent Labs  Lab 11/23/21 0606 11/24/21 0051  DDIMER 0.60* 0.43      Radiology    No results found.  Cardiac Studies   Echo:    1. Left ventricular ejection fraction, by estimation, is 65 to 70%. The  left ventricle has normal function. The left ventricle has no regional  wall motion abnormalities. There is moderate concentric left ventricular  hypertrophy. Left ventricular  diastolic function could not be evaluated.   2. Right ventricular systolic function is normal. The right ventricular  size is normal. Tricuspid regurgitation signal is inadequate for assessing  PA pressure.   3. Left atrial size was severely dilated.   4. No evidence of mitral valve regurgitation. Moderate mitral stenosis.  The mean mitral valve gradient is 10.0 mmHg. Severe mitral annular  calcification.   5. The aortic valve has been repaired/replaced. There  is a 23 mm Sapien  prosthetic (TAVR) valve present in the aortic position. Echo findings are  consistent with normal structure and function of the aortic valve  prosthesis. Aortic valve mean gradient  measures 12.7 mmHg. Aortic valve Vmax measures 2.50 m/s.   6. The inferior vena cava is dilated in size with >50% respiratory  variability, suggesting right atrial pressure of 8 mmHg.   Comparison(s): Mitral stenosis gradients are higher, mostly due to atrial  fibrillation with rapid ventricular rate. There is a new TAVR that appears  to have normal function, baseline gradients are not available for  comparison in our system, but are lower   than reported in 11/03/2021 echo from [place].   FINDINGS   Left Ventricle: Left ventricular ejection fraction, by estimation, is 65  to 70%. The left ventricle has normal function. The left ventricle has no  regional wall motion abnormalities. The left ventricular internal cavity  size was normal in size. There is   moderate concentric left ventricular hypertrophy. Left ventricular  diastolic function could not be evaluated. Left ventricular diastolic  function could not be evaluated due to atrial fibrillation.   Right Ventricle: The right ventricular size is normal. No increase in  right ventricular wall thickness. Right ventricular systolic function is  normal. Tricuspid regurgitation signal is inadequate for assessing PA  pressure.   Patient Profile     80 y.o. 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 Jarvis Newcomer.  Assessment & Plan    1. Paroxysmal AF with RVR - unclear duration - continue Eliquis  - now rate controlled on IV amio. Would continue while in house. Will run at 60/hr for next 24 hours as HR got quite fast while walking yesterday - Plan TEE/DC-CV on Tuesday unless she converts on IV  amio  - has h/o GIB after TAVR. Can consider Watchman as outpatient  2. Elevated troponin:   - Elevated but flat.  No  new LV wall motion abnormalities. - cath 9/21 non-obstructive CAD - medical management with statin. Off ASA with Eliquis  3.  Acute on chronic diastolic HF in setting of volume resuscitation: - Volume status elevated - Will give one dose IV lasix today  4. COVID-19 infection with acute hypoxic respiratory failure - now off bipap - remains on remdisivir, remains on dexamethasone - CCM managing  5. AS s/p TAVR -  stable on echo   6. DM2 - per primary team - with CAD, obesity and DM2 consider GLP1-RA   For questions or updates, please contact [facility] Please consult [email or web address] for contact info under Cardiology/STEMI.   Signed, Arvilla Meres, MD  11/26/2021, 10:16 AM

## 2021-11-26 NOTE — Plan of Care (Signed)

## 2021-11-27 DIAGNOSIS — U071 COVID-19: Secondary | ICD-10-CM | POA: Diagnosis not present

## 2021-11-27 LAB — CBC WITH DIFFERENTIAL/PLATELET
Abs Immature Granulocytes: 0.11 10*3/uL — ABNORMAL HIGH (ref 0.00–0.07)
Basophils Absolute: 0 10*3/uL (ref 0.0–0.1)
Basophils Relative: 0 %
Eosinophils Absolute: 0 10*3/uL (ref 0.0–0.5)
Eosinophils Relative: 0 %
HCT: 39.1 % (ref 36.0–46.0)
Hemoglobin: 12.9 g/dL (ref 12.0–15.0)
Immature Granulocytes: 1 %
Lymphocytes Relative: 9 %
Lymphs Abs: 1.1 10*3/uL (ref 0.7–4.0)
MCH: 30.4 pg (ref 26.0–34.0)
MCHC: 33 g/dL (ref 30.0–36.0)
MCV: 92 fL (ref 80.0–100.0)
Monocytes Absolute: 0.7 10*3/uL (ref 0.1–1.0)
Monocytes Relative: 6 %
Neutro Abs: 9.6 10*3/uL — ABNORMAL HIGH (ref 1.7–7.7)
Neutrophils Relative %: 84 %
Platelets: 221 10*3/uL (ref 150–400)
RBC: 4.25 MIL/uL (ref 3.87–5.11)
RDW: 14.8 % (ref 11.5–15.5)
WBC: 11.5 10*3/uL — ABNORMAL HIGH (ref 4.0–10.5)
nRBC: 0 % (ref 0.0–0.2)

## 2021-11-27 LAB — GLUCOSE, CAPILLARY
Glucose-Capillary: 183 mg/dL — ABNORMAL HIGH (ref 70–99)
Glucose-Capillary: 219 mg/dL — ABNORMAL HIGH (ref 70–99)
Glucose-Capillary: 245 mg/dL — ABNORMAL HIGH (ref 70–99)
Glucose-Capillary: 230 mg/dL — ABNORMAL HIGH (ref 70–99)

## 2021-11-27 LAB — COMPREHENSIVE METABOLIC PANEL
ALT: 26 U/L (ref 0–44)
AST: 29 U/L (ref 15–41)
Albumin: 2.8 g/dL — ABNORMAL LOW (ref 3.5–5.0)
Alkaline Phosphatase: 58 U/L (ref 38–126)
Anion gap: 9 (ref 5–15)
BUN: 19 mg/dL (ref 8–23)
CO2: 23 mmol/L (ref 22–32)
Calcium: 8.1 mg/dL — ABNORMAL LOW (ref 8.9–10.3)
Chloride: 104 mmol/L (ref 98–111)
Creatinine, Ser: 1.06 mg/dL — ABNORMAL HIGH (ref 0.44–1.00)
GFR, Estimated: 54 mL/min — ABNORMAL LOW (ref >60)
Glucose, Bld: 220 mg/dL — ABNORMAL HIGH (ref 70–99)
Potassium: 4.4 mmol/L (ref 3.5–5.1)
Sodium: 136 mmol/L (ref 135–145)
Total Bilirubin: 0.6 mg/dL (ref 0.3–1.2)
Total Protein: 5.3 g/dL — ABNORMAL LOW (ref 6.5–8.1)

## 2021-11-27 LAB — C-REACTIVE PROTEIN: CRP: 2.9 mg/dL — ABNORMAL HIGH (ref <1.0)

## 2021-11-27 LAB — PROTIME-INR
INR: 1.2 (ref 0.8–1.2)
Prothrombin Time: 15.4 seconds — ABNORMAL HIGH (ref 11.4–15.2)

## 2021-11-27 MED ORDER — SODIUM CHLORIDE 0.9 % IV SOLN: INTRAVENOUS | Status: DC

## 2021-11-27 MED ORDER — AMIODARONE HCL 150 MG/3ML IV SOLN
150.0000 mg | Freq: Once | INTRAVENOUS | Status: DC
  Filled 2021-11-27: qty 3

## 2021-11-27 MED ORDER — METOPROLOL TARTRATE 5 MG/5ML IV SOLN: 2.5000 mg | Freq: Three times a day (TID) | INTRAVENOUS | Status: DC | PRN

## 2021-11-27 MED ORDER — METOPROLOL SUCCINATE ER 50 MG PO TB24
50.0000 mg | ORAL_TABLET | Freq: Two times a day (BID) | ORAL | Status: DC
  Administered 2021-11-27 – 2021-11-28 (×3): 50 mg via ORAL
  Filled 2021-11-27 (×3): qty 1

## 2021-11-27 MED ORDER — AMIODARONE HCL 200 MG PO TABS
400.0000 mg | ORAL_TABLET | Freq: Two times a day (BID) | ORAL | Status: DC
  Administered 2021-11-27 – 2021-11-28 (×3): 400 mg via ORAL
  Filled 2021-11-27 (×3): qty 2

## 2021-11-27 MED ORDER — GUAIFENESIN ER 600 MG PO TB12
1200.0000 mg | ORAL_TABLET | Freq: Two times a day (BID) | ORAL | Status: DC
  Administered 2021-11-27 – 2021-11-28 (×3): 1200 mg via ORAL
  Filled 2021-11-27 (×3): qty 2

## 2021-11-27 NOTE — Progress Notes (Signed)
Progress Note  Patient Name: Victoria Delgado Date of Encounter: 11/27/2021  Primary Cardiologist:   Rollene Rotunda, MD   Subjective   She feels pretty well.  No chest pain. No SOB.  Inpatient Medications    Scheduled Meds:  apixaban  5 mg Oral BID   Chlorhexidine Gluconate Cloth  6 each Topical Q0600   dexamethasone  6 mg Oral Daily   furosemide  40 mg Oral Daily   insulin aspart  0-15 Units Subcutaneous TID PC & HS   metFORMIN  500 mg Oral BID WC   metoprolol tartrate  50 mg Oral BID   omeprazole  20 mg Oral Daily   rosuvastatin  20 mg Oral Daily   Continuous Infusions:  amiodarone 60 mg/hr (11/27/21 0600)   PRN Meds: acetaminophen **OR** acetaminophen, alum & mag hydroxide-simeth, phenol, sodium chloride   Vital Signs    Vitals:   11/27/21 0500 11/27/21 0600 11/27/21 0700 11/27/21 0800  BP: 133/62 139/75 137/79 138/80  Pulse: 61 62 65 63  Resp: 20 18 20 20   Temp:  97.7 F (36.5 C)    TempSrc:  Oral    SpO2: 94% 94% 93% 92%  Weight:  129.6 kg    Height:        Intake/Output Summary (Last 24 hours) at 11/27/2021 0827 Last data filed at 11/27/2021 0600 Gross per 24 hour  Intake 1061.36 ml  Output --  Net 1061.36 ml   Filed Weights   11/25/21 0615 11/26/21 0500 11/27/21 0600  Weight: 128.1 kg 127.3 kg 129.6 kg    Telemetry    Atrial fib with RVR at times.  - Personally Reviewed  ECG    NA - Personally Reviewed  Physical Exam   GEN: No acute distress.   Neck: No  JVD Cardiac: Irregular RR,  murmurs, rubs, or gallops.  Respiratory: Clear  to auscultation bilaterally. GI: Soft, nontender, non-distended  MS: No  edema; No deformity. Neuro:  Nonfocal  Psych: Normal affect   Labs    Chemistry Recent Labs  Lab 11/25/21 0046 11/26/21 0211 11/27/21 0056  NA 136 135 136  K 4.5 4.5 4.4  CL 102 103 104  CO2 28 23 23   GLUCOSE 226* 244* 220*  BUN 14 18 19   CREATININE 0.92 0.87 1.06*  CALCIUM 8.2* 8.3* 8.1*  PROT 5.6* 5.7* 5.3*  ALBUMIN  2.8* 2.9* 2.8*  AST 39 41 29  ALT 21 30 26   ALKPHOS 50 58 58  BILITOT 0.5 0.7 0.6  GFRNONAA >60 >60 54*  ANIONGAP 6 9 9      Hematology Recent Labs  Lab 11/25/21 0046 11/26/21 0211 11/27/21 0056  WBC 6.0 13.0* 11.5*  RBC 4.09 4.21 4.25  HGB 12.4 12.7 12.9  HCT 37.8 39.1 39.1  MCV 92.4 92.9 92.0  MCH 30.3 30.2 30.4  MCHC 32.8 32.5 33.0  RDW 14.8 15.0 14.8  PLT 176 230 221    Cardiac EnzymesNo results for input(s): TROPONINI in the last 168 hours. No results for input(s): TROPIPOC in the last 168 hours.   BNP Recent Labs  Lab 11/24/21 0051  BNP 609.0*     DDimer  Recent Labs  Lab 11/23/21 0606 11/24/21 0051  DDIMER 0.60* 0.43     Radiology    No results found.  Cardiac Studies   TTE:  1. Left ventricular ejection fraction, by estimation, is 65 to 70%. The  left ventricle has normal function. The left ventricle has no regional  wall motion  abnormalities. There is moderate concentric left ventricular  hypertrophy. Left ventricular  diastolic function could not be evaluated.   2. Right ventricular systolic function is normal. The right ventricular  size is normal. Tricuspid regurgitation signal is inadequate for assessing  PA pressure.   3. Left atrial size was severely dilated.   4. No evidence of mitral valve regurgitation. Moderate mitral stenosis.  The mean mitral valve gradient is 10.0 mmHg. Severe mitral annular  calcification.   5. The aortic valve has been repaired/replaced. There is a 23 mm Sapien  prosthetic (TAVR) valve present in the aortic position. Echo findings are  consistent with normal structure and function of the aortic valve  prosthesis. Aortic valve mean gradient  measures 12.7 mmHg. Aortic valve Vmax measures 2.50 m/s.   6. The inferior vena cava is dilated in size with >50% respiratory  variability, suggesting right atrial pressure of 8 mmHg.   Patient Profile     80 y.o. female with a hx of PAF, CAD, LVH, AVS with TAVR  09/2020, HTN, HOCM, OSA, HLD, who is being seen 11/23/2021 for the evaluation of atrial fib jwith RVR and elevated troponin at the request of Dr. Jarvis Newcomer.    Assessment & Plan     ELEVATED TROPONIN:  Elevated but flat.  No invasive evaluation indicated as this is likely demand ischemia.    ACUTE DIASTOLIC HF:  Related to fluid given on admission for hypotension and rapid rate atrial fib.  Resolved.    ATRIAL FIB:  On Eliquis.  Will eventually need Watchman.  I am going to change to metoprolol succinate as she does well with bid dosing of this at home.  I am going to stop the IV amio and start PO.  I likely will plan DCCV as an out patient but will see if we can put her on for TEE DCCV tomorrow if rate is not reasonably controlled.      HYPOTENSION:  Resolved.    COVID:    Completed remdesivir.      For questions or updates, please contact [facility] Please consult [email or web address] for contact info under Cardiology/STEMI.   Signed, Rollene Rotunda, MD  11/27/2021, 8:27 AM

## 2021-11-27 NOTE — Plan of Care (Signed)

## 2021-11-27 NOTE — Consult Note (Signed)
NAME:  Victoria Delgado, MRN:  [id number], DOB:  08-Mar-1941, LOS: 4 ADMISSION DATE:  11/22/2021, CONSULTATION DATE:  11/27/21 REFERRING MD:  Alcario Drought, CHIEF COMPLAINT:  atrial fibrillation   History of Present Illness:  Victoria Delgado is a 80 y.o. F with PMH of paroxysmal atrial fibrillation not on anti-coagulation, CAD, HL, HTN, AVS with TAVR, HOCM, OSA and hepatitis who began developing a cough and shortness of breath around 12/19, several days later she presented to the ED secondary to palpitations and was in atrial fibrillation with RVR.  She tested positive for Covid-19, CXR without significant infiltrates and inflammatory markers only mildly elevated.  She was admitted and started on amiodarone and remdesevir.   Pt's heart rate remained poorly controlled and she complained of L shoulder pain. She was given IVF and then developed pulmonary edema and required HFNC.  Troponin peaked at 2,985 and cardiology was consulted.  She had intermittent hypotension with RVR and so PCCM consulted 12/23.  Pertinent  Medical History   has a past medical history of Abdominal pain, Arthritis, Asymmetric septal hypertrophy ([place]), Atrial fibrillation ([place]), CAD (coronary artery disease), Cataract, Fatty liver, Gallstones, GERD (gastroesophageal reflux disease), GERD (gastroesophageal reflux disease), Heart palpitations, Hepatitis, Hernia, hiatal, Hyperlipidemia, Hypertension, Obesity, and Sleep apnea.   Significant Hospital Events: Including procedures, antibiotic start and stop dates in addition to other pertinent events   12/22 admit to hospitalist service  12/23 intermittent hypotension with RVR, transfer to ICU and PCCM consult 12/24 weaning O2 and improved rate control.  12/26 still in rate controlled atrial fibrillation.   Interim History / Subjective:   No dyspnea. No palpitations.   Objective   Blood pressure 122/68, pulse 69, temperature 98 F (36.7 C), temperature source Oral, resp. rate (!) 23,  height 5\' 8"  (1.727 m), weight 129.6 kg, SpO2 94 %.        Intake/Output Summary (Last 24 hours) at 11/27/2021 1655 Last data filed at 11/27/2021 1653 Gross per 24 hour  Intake 1496.96 ml  Output 1350 ml  Net 146.96 ml    Filed Weights   11/25/21 0615 11/26/21 0500 11/27/21 0600  Weight: 128.1 kg 127.3 kg 129.6 kg   General:  elderly F sitting up in bed HEENT: MM pink/moist, sclera anicteric Neuro: awake, oriented and conversation without focal deficits CV: s1s2 tachycardic, irregular, no m/r/g PULM:  mildly decreased in the bilateral bases without wheezing or rhonchi, tachypneic without accessory muscle use  GI: soft, bsx4 active  Extremities: warm/dry, 2+ edema  Skin: no rashes or lesions   Ancillary tests personally reviewed:   CBG's in 200's HBA1C 6.8% Cardiogram shows normal LV function with moderate LVH.  Left atrium is severely dilated.  Assessment & Plan:   Atrial Fibrillation with RVR - now controlled.  NSTEMI Acute on chronic HF exacerbation Baseline HTN, CAD, AVS with TAVR, HFpEF Acute Hypoxic Respiratory insufficiency. Covid-19 + DM type 2 with Hba1c 6.8%  Plan:  - Switch to oral amiodarone. - Continue metoprolol and Eliquis - may spontaneously convert -Plan for TEE guided cardioversion if necessary 12/27.   -Remains on room air. - Step steroids down to oral dexamethasone and complete 10 days.  - Resume home furosemide.  - Started metformin for T2DM and steroid-induced hyperglycemia.  Best Practice (right click and "Reselect all SmartList Selections" daily)   Diet/type: Regular consistency (see orders) DVT prophylaxis: Eliquis GI prophylaxis: N/A Lines: N/A Foley:  N/A Code Status:  full code Last date of multidisciplinary goals of care discussion -  patient updated with husband at the bedside.  Lynnell Catalan, MD [facility] ICU Physician [facility]  Pager: [phone] Or Epic Secure Chat After hours: [phone].  11/27/2021,  4:55 PM

## 2021-11-27 NOTE — Progress Notes (Incomplete)
Progress Note  Patient Name: Victoria Delgado Date of Encounter: 11/27/2021  [facility] Cardiologist: Rollene Rotunda, MD ***  Subjective   ***  Inpatient Medications    Scheduled Meds:  amiodarone  400 mg Oral BID   apixaban  5 mg Oral BID   Chlorhexidine Gluconate Cloth  6 each Topical Q0600   dexamethasone  6 mg Oral Daily   furosemide  40 mg Oral Daily   guaiFENesin  1,200 mg Oral BID   insulin aspart  0-15 Units Subcutaneous TID PC & HS   metFORMIN  500 mg Oral BID WC   metoprolol succinate  50 mg Oral BID   omeprazole  20 mg Oral Daily   rosuvastatin  20 mg Oral Daily   Continuous Infusions:  PRN Meds: acetaminophen **OR** acetaminophen, alum & mag hydroxide-simeth, metoprolol tartrate, phenol, sodium chloride   Vital Signs    Vitals:   11/27/21 1549 11/27/21 1600 11/27/21 1700 11/27/21 1800  BP: 133/85 122/68 135/88 104/77  Pulse: 66 69 98 81  Resp: (!) 21 (!) 23 (!) 28 (!) 21  Temp:  98 F (36.7 C)    TempSrc:  Oral    SpO2: 92% 94% 93% 94%  Weight:      Height:        Intake/Output Summary (Last 24 hours) at 11/27/2021 1810 Last data filed at 11/27/2021 1653 Gross per 24 hour  Intake 1291.43 ml  Output 1350 ml  Net -58.57 ml   Last 3 Weights 11/27/2021 11/26/2021 11/25/2021  Weight (lbs) 285 lb 11.5 oz 280 lb 10.3 oz 282 lb 6.6 oz  Weight (kg) 129.6 kg 127.3 kg 128.1 kg      Telemetry    *** - Personally Reviewed  ECG    *** - Personally Reviewed  Physical Exam  *** GEN: No acute distress.   Neck: No JVD Cardiac: RRR, no murmurs, rubs, or gallops.  Respiratory: Clear to auscultation bilaterally. GI: Soft, nontender, non-distended  MS: No edema; No deformity. Neuro:  Nonfocal  Psych: Normal affect   Labs    High Sensitivity Troponin:   Recent Labs  Lab 11/23/21 0746 11/24/21 0051 11/24/21 0222 11/24/21 0325 11/24/21 0618  TROPONINIHS 2,985* 2,880* 2,722* 2,110* 2,759*     Chemistry Recent Labs  Lab 11/22/21 2229  11/23/21 0606 11/25/21 0046 11/26/21 0211 11/27/21 0056  NA 131*   < > 136 135 136  K 3.7   < > 4.5 4.5 4.4  CL 101   < > 102 103 104  CO2 21*   < > 28 23 23   GLUCOSE 153*   < > 226* 244* 220*  BUN 14   < > 14 18 19   CREATININE 1.04*   < > 0.92 0.87 1.06*  CALCIUM 7.9*   < > 8.2* 8.3* 8.1*  MG 1.9  --   --   --   --   PROT  --    < > 5.6* 5.7* 5.3*  ALBUMIN  --    < > 2.8* 2.9* 2.8*  AST  --    < > 39 41 29  ALT  --    < > 21 30 26   ALKPHOS  --    < > 50 58 58  BILITOT  --    < > 0.5 0.7 0.6  GFRNONAA 55*   < > >60 >60 54*  ANIONGAP 9   < > 6 9 9    < > = values in this interval  not displayed.    Lipids No results for input(s): CHOL, TRIG, HDL, LABVLDL, LDLCALC, CHOLHDL in the last 168 hours.  Hematology Recent Labs  Lab 11/25/21 0046 11/26/21 0211 11/27/21 0056  WBC 6.0 13.0* 11.5*  RBC 4.09 4.21 4.25  HGB 12.4 12.7 12.9  HCT 37.8 39.1 39.1  MCV 92.4 92.9 92.0  MCH 30.3 30.2 30.4  MCHC 32.8 32.5 33.0  RDW 14.8 15.0 14.8  PLT 176 230 221   Thyroid  Recent Labs  Lab 11/23/21 0606  TSH 1.711    BNP Recent Labs  Lab 11/24/21 0051  BNP 609.0*    DDimer  Recent Labs  Lab 11/23/21 0606 11/24/21 0051  DDIMER 0.60* 0.43     Radiology    No results found.  Cardiac Studies     TTE:   1. Left ventricular ejection fraction, by estimation, is 65 to 70%. The  left ventricle has normal function. The left ventricle has no regional  wall motion abnormalities. There is moderate concentric left ventricular  hypertrophy. Left ventricular  diastolic function could not be evaluated.   2. Right ventricular systolic function is normal. The right ventricular  size is normal. Tricuspid regurgitation signal is inadequate for assessing  PA pressure.   3. Left atrial size was severely dilated.   4. No evidence of mitral valve regurgitation. Moderate mitral stenosis.  The mean mitral valve gradient is 10.0 mmHg. Severe mitral annular  calcification.   5. The aortic  valve has been repaired/replaced. There is a 23 mm Sapien  prosthetic (TAVR) valve present in the aortic position. Echo findings are  consistent with normal structure and function of the aortic valve  prosthesis. Aortic valve mean gradient  measures 12.7 mmHg. Aortic valve Vmax measures 2.50 m/s.   6. The inferior vena cava is dilated in size with >50% respiratory  variability, suggesting right atrial pressure of 8 mmHg.   Patient Profile     80 y.o. 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 Transitioned from IV amiodarone to PO and on metop BID for rate control.  #Elevated troponin: Flat and likely due to demand ischemia in the setting of Afib with RVR and COVID. Cath 08/2020 with non-obstructive CAD. No ischemic evaluation needed during admission.   #Severe AS s/p TAVR: -Stable on TTE -Will continue routine surveillance as out-patient  #Hypotension: Resolved  #Covid: -Completed remdesivir {Are we signing off today?:[id number]}  For questions or updates, please contact [facility] Please consult [email or web address] for contact info under        Signed, Meriam Sprague, MD  11/27/2021, 6:10 PM

## 2021-11-28 DIAGNOSIS — I2510 Atherosclerotic heart disease of native coronary artery without angina pectoris: Secondary | ICD-10-CM

## 2021-11-28 DIAGNOSIS — I421 Obstructive hypertrophic cardiomyopathy: Secondary | ICD-10-CM

## 2021-11-28 DIAGNOSIS — N179 Acute kidney failure, unspecified: Secondary | ICD-10-CM

## 2021-11-28 LAB — COMPREHENSIVE METABOLIC PANEL
ALT: 24 U/L (ref 0–44)
AST: 20 U/L (ref 15–41)
Albumin: 2.8 g/dL — ABNORMAL LOW (ref 3.5–5.0)
Alkaline Phosphatase: 53 U/L (ref 38–126)
Anion gap: 9 (ref 5–15)
BUN: 19 mg/dL (ref 8–23)
CO2: 28 mmol/L (ref 22–32)
Calcium: 8.3 mg/dL — ABNORMAL LOW (ref 8.9–10.3)
Chloride: 103 mmol/L (ref 98–111)
Creatinine, Ser: 0.86 mg/dL (ref 0.44–1.00)
GFR, Estimated: >60 mL/min (ref >60)
Glucose, Bld: 157 mg/dL — ABNORMAL HIGH (ref 70–99)
Potassium: 4.2 mmol/L (ref 3.5–5.1)
Sodium: 140 mmol/L (ref 135–145)
Total Bilirubin: 0.7 mg/dL (ref 0.3–1.2)
Total Protein: 5.5 g/dL — ABNORMAL LOW (ref 6.5–8.1)

## 2021-11-28 LAB — CBC WITH DIFFERENTIAL/PLATELET
Abs Immature Granulocytes: 0.25 10*3/uL — ABNORMAL HIGH (ref 0.00–0.07)
Basophils Absolute: 0 10*3/uL (ref 0.0–0.1)
Basophils Relative: 0 %
Eosinophils Absolute: 0 10*3/uL (ref 0.0–0.5)
Eosinophils Relative: 0 %
HCT: 39 % (ref 36.0–46.0)
Hemoglobin: 13.1 g/dL (ref 12.0–15.0)
Immature Granulocytes: 2 %
Lymphocytes Relative: 13 %
Lymphs Abs: 1.5 10*3/uL (ref 0.7–4.0)
MCH: 30.8 pg (ref 26.0–34.0)
MCHC: 33.6 g/dL (ref 30.0–36.0)
MCV: 91.5 fL (ref 80.0–100.0)
Monocytes Absolute: 1 10*3/uL (ref 0.1–1.0)
Monocytes Relative: 9 %
Neutro Abs: 8.9 10*3/uL — ABNORMAL HIGH (ref 1.7–7.7)
Neutrophils Relative %: 76 %
Platelets: 227 10*3/uL (ref 150–400)
RBC: 4.26 MIL/uL (ref 3.87–5.11)
RDW: 14.6 % (ref 11.5–15.5)
WBC: 11.6 10*3/uL — ABNORMAL HIGH (ref 4.0–10.5)
nRBC: 0.2 % (ref 0.0–0.2)

## 2021-11-28 LAB — GLUCOSE, CAPILLARY
Glucose-Capillary: 146 mg/dL — ABNORMAL HIGH (ref 70–99)
Glucose-Capillary: 181 mg/dL — ABNORMAL HIGH (ref 70–99)

## 2021-11-28 LAB — MAGNESIUM: Magnesium: 2.2 mg/dL (ref 1.7–2.4)

## 2021-11-28 LAB — C-REACTIVE PROTEIN: CRP: 1.2 mg/dL — ABNORMAL HIGH (ref <1.0)

## 2021-11-28 MED ORDER — APIXABAN 5 MG PO TABS: 5.0000 mg | ORAL_TABLET | Freq: Two times a day (BID) | ORAL | 0 refills | Status: DC

## 2021-11-28 MED ORDER — AMIODARONE HCL 200 MG PO TABS: ORAL_TABLET | ORAL | 0 refills | Status: DC

## 2021-11-28 MED ORDER — METOPROLOL SUCCINATE ER 50 MG PO TB24: 50.0000 mg | ORAL_TABLET | Freq: Two times a day (BID) | ORAL | 0 refills | Status: DC

## 2021-11-28 NOTE — Discharge Summary (Signed)
Physician Discharge Summary  KALYNA MICHELS [id number] DOB: 26-Apr-1941 DOA: 11/22/2021  PCP: Sueanne Margarita, DO  Admit date: 11/22/2021 Discharge date: 11/28/2021  Admitted From: Home Disposition: Home   Recommendations for Outpatient Follow-up:  Follow up with PCP in 1-2 weeks Follow up with atrial fibrillation clinic, coumadin clinic. Likely to switch eliquis > coumadin given moderate AS (technically valvular AFib).   Home Health: None Equipment/Devices: None Discharge Condition: Stable CODE STATUS: Full Diet recommendation: Heart healthy  Brief/Interim Summary: Victoria Delgado is a 80 y.o. female with a history of PAF, GI bleed on eliquis, s/p TAVR, CAD s/p PCI who presented to the ED 12/21 with palpitations and chest discomfort. She had developed URI symptoms with cough on 12/19 after dining with her daughter and son-in-law (who developed symptoms hours afterward) on 12/16. She was found to be hypoxemic, improved on 2L O2, wheezing, with chronic-appearing increased lung markings and bibasilar opacity favored to be atelectasis on CXR. +SARS-CoV-2 PCR, CRP 1.3. She was in AFib with RVR for which diltiazem infusion was started but subsequently stopped due to hypotension. Cardiology was consulted for assistance with management and for troponin elevation. IV heparin and amiodarone were initiated though rate became worse, hypoxia worsened with infiltrates on CXR concerning for covid pneumonia with pulmonary edema from IVF given at admission, inflammatory markers rising. She was admitted to ICU under PCCM service, steroids and remdesivir given. Cardiology guided rate control measures and recommended deferring work up for NSTEMI. Heart rate has improved, symptoms resolved, and the patient was transferred to the medical floor 12/26, stable for discharge 12/27.   Discharge Diagnoses:  Principal Problem:   COVID-19 virus infection Active Problems:   HOCM (hypertrophic obstructive cardiomyopathy)  ([place])   Sleep apnea   Coronary artery disease involving native coronary artery of native heart without angina pectoris   Atrial fibrillation with rapid ventricular response (HCC)   ARF (acute renal failure) (HCC)   Acute respiratory failure with hypoxemia (HCC)  Acute hypoxemic respiratory failure due to covid-19 infection, worsened by acute pulmonary edema due to acute HFpEF with history of HOCM : Covid exposure 12/16, symptoms 12/19, presented and positive 12/21. s/p Pfizer vaccine x2 March 2021. - Completed remdesivir, has tapered steroids, durably off oxygen and having some hyperglycemia with steroids. We will stop steroids now that she's improved including diminution of inflammatory marker. CRP 1.2.  - Isolation x10 days is recommended.     PAF with RVR: Rates better controlled. -Continue amiodarone 400mg  BID x7 days, then 200mg  BID for 7 days and then 200mg  daily thereafter - Has follow up in AFib clinic after discharge. If remains in Afib as an out-patient following recovery from [place], can plan for DCCV at that time - Continue apixaban 5mg  BID; may merit warfarin as an out-patient due to moderate MS on TTE. Has follow up with anticoagulation clinic to discuss this - Will also discuss possible watchman given history of GIB and need for warfarin for [facility] - Continue metoprolol 50mg  BID  Demand myocardial ischemia in pt w/CAD s/p PCI to LCx and diagonal, patent at [facility] Aug 2021. No WMA on echo. - No invasive evaluation currently planned per cardiology.   History of AS s/p TAVR: at [facility] Oct 2021 (23 mm Edwards Sapien S3 Ultra) last echo there 11/03/21 with stable valve, mean gradient stable at 17 mmHg. Has moderate MS now by echo.   History of GI bleed: Not on DOAC PTA for this reason.  - Will monitor closely with anticoagulation,  no recurrence noted.   Thyroid nodule: Worked up per endocrinology as outpatient. TSH is wnl at 1.711.    OSA:  - CPAP qHS   T2DM: HbA1c 6.8% checked while  hospitalized.  - PCP follow up with possible recheck A1c. Pt had diarrhea with metformin, wishes not to continue at this time.   Obesity: Estimated body mass index is 43.44 kg/m as calculated from the following:   Height as of this encounter: 5\' 8"  (1.727 m).   Weight as of this encounter: 129.6 kg.  Discharge Instructions Discharge Instructions     Diet - low sodium heart healthy   Complete by: As directed    Discharge instructions   Complete by: As directed    You were evaluated for covid-19 infection and rapid atrial fibrillation and have improved. you are stable for discharge with the following recommendations:  - Follow up with the atrial fibrillation clinic after discharge to discuss need for cardioversion and possibly switching eliquis to coumadin.  - Take metoprolol 50mg  twice daily - Take amiodarone in a taper. 400mg  twice daily for a week, 200mg  twice daily for a week, then 200mg  once daily thereafter - Continue eliquis 5mg  twice daily started in the hospital and report any evidence of bleeding right away. Since you are taking eliquis now, you should stop taking aspirin. - Remain in isolation for 10 total days. It is still recommended that you get a booster vaccination around 90 days from this infection.  Your hemoglobin A1c is 6.8% which technically qualifies for the diagnosis of diabetes. You will need to discuss initiation of treatment with your PCP going forward and you should limit carbohydrates in the diet.   If your symptoms return, seek medical attention right away.   Increase activity slowly   Complete by: As directed    MyChart COVID-19 home monitoring program   Complete by: Nov 28, 2021    Is the patient willing to use the MyChart Mobile App for home monitoring?: Yes      Allergies as of 11/28/2021       Reactions   Codeine Nausea Only   Kenalog [triamcinolone Acetonide] Other (See Comments)   Had A.fib after she received the injection."Steroid meds"    Relafen [nabumetone] Other (See Comments)   Edema   Epinephrine Palpitations   Penicillins Rash   40 years ago, injection site was red and swollen.  rash, facial/tongue/throat swelling, SOB or lightheadedness with hypotension: Yes Has patient had a PCN reaction causing immediate  Has patient had a PCN reaction causing severe rash involving mucus membranes or skin necrosis: NO Has patient had a PCN reaction that required hospitalization. No  Has patient had a PCN reaction occurring within the last 10 years: No If all of the above answers are "NO", then may proceed with Cephalosporin use.    Tramadol Other (See Comments)   Auditory halllucinations        Medication List     STOP taking these medications    aspirin EC 81 MG tablet       TAKE these medications    acetaminophen 500 MG tablet Commonly known as: TYLENOL Take 1,000 mg by mouth every 6 (six) hours as needed for moderate pain or headache.   albuterol 108 (90 Base) MCG/ACT inhaler Commonly known as: VENTOLIN HFA Inhale 2 puffs into the lungs every 4 (four) hours as needed for wheezing or shortness of breath.   alum & mag hydroxide-simeth 200-200-20 MG/5ML suspension Commonly known as: MAALOX/MYLANTA  Take 30 mLs by mouth every 6 (six) hours as needed for indigestion or heartburn.   amiodarone 200 MG tablet Commonly known as: PACERONE Take 2 tablets (400 mg total) by mouth 2 (two) times daily for 7 days, THEN 1 tablet (200 mg total) 2 (two) times daily for 7 days, THEN 1 tablet (200 mg total) daily for 16 days. Start taking on: November 28, 2021   Anoro Ellipta 62.5-25 MCG/ACT Aepb Generic drug: umeclidinium-vilanterol Inhale 1 puff into the lungs daily.   apixaban 5 MG Tabs tablet Commonly known as: ELIQUIS Take 1 tablet (5 mg total) by mouth 2 (two) times daily.   cholecalciferol 25 MCG (1000 UNIT) tablet Commonly known as: VITAMIN D3 Take 1,000 Units by mouth 2 (two) times daily.   CINNAMON PO Take 1  capsule by mouth in the morning and at bedtime.   Crestor 20 MG tablet Generic drug: rosuvastatin TAKE 1 TABLET(20 MG) BY MOUTH DAILY What changed:  how much to take how to take this when to take this additional instructions   furosemide 40 MG tablet Commonly known as: LASIX Take 1 tablet (40 mg total) by mouth daily.   loteprednol 0.5 % ophthalmic suspension Commonly known as: LOTEMAX Place 1 drop into both eyes daily as needed (imflammation).   metoprolol succinate 50 MG 24 hr tablet Commonly known as: Toprol XL Take 1 tablet (50 mg total) by mouth 2 (two) times daily. What changed:  how much to take how to take this when to take this additional instructions   multivitamin tablet Take 1 tablet by mouth daily.   nitroGLYCERIN 0.4 MG SL tablet Commonly known as: NITROSTAT Place 1 tablet (0.4 mg total) under the tongue every 5 (five) minutes as needed for chest pain.   omeprazole 10 MG capsule Commonly known as: PRILOSEC Take 10 mg by mouth daily.   ZINC PO Take 1 tablet by mouth daily.        Follow-up Information     [place] Office Follow up.   Specialty: Cardiology Why: [place] location - we have arranged a Coumadin clinic appointment for you on Thursday Nov 30, 2021 at 10:30 AM to discuss transition from Eliquis to Coumadin. Please arrive 15 minutes early to check in. Contact information: [address] [place] Follow up.   Specialty: Cardiology Why: We have arranged a follow up appointment in the afib clinic at the [facility] within [facility] hospital on Thursday Dec 21, 2021 at 11:30 AM. See end of after visit summary for more information on parking. Contact information: [address] [id number] mc [place] [phone]        Sueanne Margarita, DO Follow up.    Specialty: Internal Medicine Contact information: [place] [address] [phone]         Minus Breeding, MD .   Specialty: Cardiology Contact information: [address] [place] [address] [phone]                Allergies  Allergen Reactions   Codeine Nausea Only   Kenalog [Triamcinolone Acetonide] Other (See Comments)    Had A.fib after she received the injection."Steroid meds"   Relafen [Nabumetone] Other (See Comments)    Edema    Epinephrine Palpitations   Penicillins Rash    40 years ago,  injection site was red and swollen.  rash, facial/tongue/throat swelling, SOB or lightheadedness with hypotension: Yes Has patient had a PCN reaction causing immediate  Has patient had a PCN reaction causing severe rash involving mucus membranes or skin necrosis: NO Has patient had a PCN reaction that required hospitalization. No  Has patient had a PCN reaction occurring within the last 10 years: No If all of the above answers are "NO", then may proceed with Cephalosporin use.     Tramadol Other (See Comments)    Auditory halllucinations    Consultations: Cardiology PCCM  Procedures/Studies: DG CHEST PORT 1 VIEW  Result Date: 11/24/2021 CLINICAL DATA:  Respiratory distress. EXAM: PORTABLE CHEST 1 VIEW COMPARISON:  November 23, 2021 FINDINGS: Mild, diffusely increased interstitial lung markings are seen. Moderate severity areas of atelectasis and/or infiltrate are noted within the bilateral lung bases. There is no evidence of a pleural effusion or pneumothorax. The cardiac silhouette is mildly enlarged and unchanged in size. Marked severity calcification of the aortic arch is noted. The visualized skeletal structures are unremarkable. IMPRESSION: Mild interstitial edema with moderate severity bibasilar atelectasis and/or infiltrate. Electronically Signed   By: Virgina Norfolk M.D.   On: 11/24/2021 02:36   DG Chest Port 1  View  Result Date: 11/23/2021 CLINICAL DATA:  Chest pain. EXAM: PORTABLE CHEST 1 VIEW COMPARISON:  June 09, 2019 FINDINGS: Mild, chronic appearing increased lung markings are seen with mild bibasilar atelectasis. There is no evidence of a pleural effusion or pneumothorax. The cardiac silhouette is enlarged and unchanged in size. An artificial right aortic valve is seen. There is marked severity calcification of the aortic arch. Degenerative changes are seen throughout the thoracic spine. IMPRESSION: Chronic appearing increased lung markings with mild bibasilar atelectasis. Electronically Signed   By: Virgina Norfolk M.D.   On: 11/23/2021 00:41   ECHOCARDIOGRAM LIMITED  Result Date: 11/24/2021    ECHOCARDIOGRAM LIMITED REPORT   Patient Name:   CHYNAH FONTENO Date of Exam: 11/24/2021 Medical Rec #:  [id number]     Height:       68.0 in Accession #:    [id number]    Weight:       280.0 lb Date of Birth:  07/07/1941    BSA:          2.358 m Patient Age:    41 years      BP:           86/69 mmHg Patient Gender: F             HR:           115 bpm. Exam Location:  Inpatient Procedure: Limited Echo, Cardiac Doppler and Color Doppler Indications:    Elevated Troponin  History:        Patient has prior history of Echocardiogram examinations, most                 recent 05/16/2020. CAD, Arrythmias:Atrial Fibrillation; Risk                 Factors:Hypertension.                 Aortic Valve: 23 mm Sapien prosthetic, stented (TAVR) valve is                 present in the aortic position.  Sonographer:    Glo Herring Referring Phys: [place]  1. Left ventricular ejection fraction, by estimation, is 65 to 70%. The left ventricle  has normal function. The left ventricle has no regional wall motion abnormalities. There is moderate concentric left ventricular hypertrophy. Left ventricular diastolic function could not be evaluated.  2. Right ventricular systolic function is normal. The right ventricular  size is normal. Tricuspid regurgitation signal is inadequate for assessing PA pressure.  3. Left atrial size was severely dilated.  4. No evidence of mitral valve regurgitation. Moderate mitral stenosis. The mean mitral valve gradient is 10.0 mmHg. Severe mitral annular calcification.  5. The aortic valve has been repaired/replaced. There is a 23 mm Sapien prosthetic (TAVR) valve present in the aortic position. Echo findings are consistent with normal structure and function of the aortic valve prosthesis. Aortic valve mean gradient measures 12.7 mmHg. Aortic valve Vmax measures 2.50 m/s.  6. The inferior vena cava is dilated in size with >50% respiratory variability, suggesting right atrial pressure of 8 mmHg. Comparison(s): Mitral stenosis gradients are higher, mostly due to atrial fibrillation with rapid ventricular rate. There is a new TAVR that appears to have normal function, baseline gradients are not available for comparison in our system, but are lower  than reported in 11/03/2021 echo from [place]. FINDINGS  Left Ventricle: Left ventricular ejection fraction, by estimation, is 65 to 70%. The left ventricle has normal function. The left ventricle has no regional wall motion abnormalities. The left ventricular internal cavity size was normal in size. There is  moderate concentric left ventricular hypertrophy. Left ventricular diastolic function could not be evaluated. Left ventricular diastolic function could not be evaluated due to atrial fibrillation. Right Ventricle: The right ventricular size is normal. No increase in right ventricular wall thickness. Right ventricular systolic function is normal. Tricuspid regurgitation signal is inadequate for assessing PA pressure. Left Atrium: Left atrial size was severely dilated. Right Atrium: Right atrial size was normal in size. Pericardium: There is no evidence of pericardial effusion. Mitral Valve: Severe mitral annular calcification. Moderate mitral valve  stenosis. MV peak gradient, 22.1 mmHg. The mean mitral valve gradient is 10.0 mmHg with average heart rate of 117 bpm. Tricuspid Valve: The tricuspid valve is grossly normal. Tricuspid valve regurgitation is not demonstrated. Aortic Valve: The aortic valve has been repaired/replaced. Aortic valve mean gradient measures 12.7 mmHg. Aortic valve peak gradient measures 24.9 mmHg. Aortic valve area, by VTI measures 1.02 cm. There is a 23 mm Sapien prosthetic, stented (TAVR) valve  present in the aortic position. Echo findings are consistent with normal structure and function of the aortic valve prosthesis. Pulmonic Valve: The pulmonic valve was grossly normal. Pulmonic valve regurgitation is not visualized. Aorta: The aortic root is normal in size and structure. Venous: The inferior vena cava is dilated in size with greater than 50% respiratory variability, suggesting right atrial pressure of 8 mmHg. IAS/Shunts: The interatrial septum was not well visualized. LEFT VENTRICLE PLAX 2D LVIDd:         4.70 cm LVIDs:         3.50 cm LV PW:         1.50 cm LV IVS:        1.40 cm LVOT diam:     2.00 cm LV SV:         47 LV SV Index:   20 LVOT Area:     3.14 cm  LEFT ATRIUM         Index LA diam:    5.20 cm 2.21 cm/m  AORTIC VALVE AV Area (Vmax):    1.31 cm AV Area (Vmean):   1.34  cm AV Area (VTI):     1.02 cm AV Vmax:           249.67 cm/s AV Vmean:          164.667 cm/s AV VTI:            0.465 m AV Peak Grad:      24.9 mmHg AV Mean Grad:      12.7 mmHg LVOT Vmax:         104.00 cm/s LVOT Vmean:        70.250 cm/s LVOT VTI:          0.151 m LVOT/AV VTI ratio: 0.32 MITRAL VALVE MV Area (PHT): 1.97 cm    SHUNTS MV Peak grad:  22.1 mmHg   Systemic VTI:  0.15 m MV Mean grad:  10.0 mmHg   Systemic Diam: 2.00 cm MV Vmax:       2.35 m/s MV Vmean:      143.0 cm/s MV Decel Time: 386 msec Mihai Croitoru MD Electronically signed by Sanda Klein MD Signature Date/Time: 11/24/2021/9:12:50 AM    Final      Subjective: Feels very  well, wants to go home. No palpitations, chest pain or dyspnea. Cough has resolved. No fever  Discharge Exam: Vitals:   11/28/21 0013 11/28/21 0542  BP: (!) 115/100 136/75  Pulse:  70  Resp: 18 18  Temp: 97.7 F (36.5 C) 97.7 F (36.5 C)  SpO2:  94%   General: Pt is alert, awake, not in acute distress Cardiovascular: RRR, S1/S2 +, no rubs, no gallops Respiratory: CTA bilaterally, no wheezing, no rhonchi Abdominal: Soft, NT, ND, bowel sounds + Extremities: Trace dependent edema, no cyanosis  Labs: BNP (last 3 results) Recent Labs    11/24/21 0051  BNP [id number]*   Basic Metabolic Panel: Recent Labs  Lab 11/22/21 2229 11/23/21 0606 11/24/21 0325 11/25/21 0046 11/26/21 0211 11/27/21 0056 11/28/21 0339  NA 131*   < > 136 136 135 136 140  K 3.7   < > 4.5 4.5 4.5 4.4 4.2  CL 101   < > 103 102 103 104 103  CO2 21*   < > 21* 28 23 23 28   GLUCOSE 153*   < > 188* 226* 244* 220* 157*  BUN 14   < > 12 14 18 19 19   CREATININE 1.04*   < > 1.03* 0.92 0.87 1.06* 0.86  CALCIUM 7.9*   < > 8.0* 8.2* 8.3* 8.1* 8.3*  MG 1.9  --   --   --   --   --  2.2   < > = values in this interval not displayed.   Liver Function Tests: Recent Labs  Lab 11/24/21 0325 11/25/21 0046 11/26/21 0211 11/27/21 0056 11/28/21 0339  AST 64* 39 41 29 20  ALT 25 21 30 26 24   ALKPHOS 54 50 58 58 53  BILITOT 0.8 0.5 0.7 0.6 0.7  PROT 6.3* 5.6* 5.7* 5.3* 5.5*  ALBUMIN 3.3* 2.8* 2.9* 2.8* 2.8*   No results for input(s): LIPASE, AMYLASE in the last 168 hours. No results for input(s): AMMONIA in the last 168 hours. CBC: Recent Labs  Lab 11/24/21 0325 11/25/21 0046 11/26/21 0211 11/27/21 0056 11/28/21 0339  WBC 8.8 6.0 13.0* 11.5* 11.6*  NEUTROABS 6.4 4.9 11.4* 9.6* 8.9*  HGB 14.2 12.4 12.7 12.9 13.1  HCT 43.3 37.8 39.1 39.1 39.0  MCV 92.9 92.4 92.9 92.0 91.5  PLT 226 176 230 221 227   Cardiac Enzymes:  No results for input(s): CKTOTAL, CKMB, CKMBINDEX, TROPONINI in the last 168  hours. BNP: Invalid input(s): POCBNP CBG: Recent Labs  Lab 11/27/21 1122 11/27/21 1638 11/27/21 2137 11/28/21 0906 11/28/21 1331  GLUCAP 219* 245* 230* 146* 181*   D-Dimer No results for input(s): DDIMER in the last 72 hours. Hgb A1c No results for input(s): HGBA1C in the last 72 hours. Lipid Profile No results for input(s): CHOL, HDL, LDLCALC, TRIG, CHOLHDL, LDLDIRECT in the last 72 hours. Thyroid function studies No results for input(s): TSH, T4TOTAL, T3FREE, THYROIDAB in the last 72 hours.  Invalid input(s): FREET3 Anemia work up No results for input(s): VITAMINB12, FOLATE, FERRITIN, TIBC, IRON, RETICCTPCT in the last 72 hours. Urinalysis    Component Value Date/Time   COLORURINE YELLOW 12/18/2020 1202   APPEARANCEUR CLEAR 12/18/2020 1202   LABSPEC 1.010 12/18/2020 1202   PHURINE 6.5 12/18/2020 1202   GLUCOSEU NEGATIVE 12/18/2020 1202   HGBUR NEGATIVE 12/18/2020 [place] 12/18/2020 1202   BILIRUBINUR neg 01/25/2015 0920   KETONESUR NEGATIVE 12/18/2020 1202   PROTEINUR NEGATIVE 12/18/2020 1202   UROBILINOGEN negative 01/25/2015 0920   UROBILINOGEN 1.0 12/27/2014 0850   NITRITE NEGATIVE 12/18/2020 [place] 12/18/2020 1202    Microbiology Recent Results (from the past 240 hour(s))  Resp Panel by RT-PCR (Flu A&B, Covid) Nasopharyngeal Swab     Status: Abnormal   Collection Time: 11/22/21 10:25 PM   Specimen: Nasopharyngeal Swab; Nasopharyngeal(NP) swabs in vial transport medium  Result Value Ref Range Status   SARS Coronavirus 2 by RT PCR POSITIVE (A) NEGATIVE Final    Comment: (NOTE) SARS-CoV-2 target nucleic acids are DETECTED.  The SARS-CoV-2 RNA is generally detectable in upper respiratory specimens during the acute phase of infection. Positive results are indicative of the presence of the identified virus, but do not rule out bacterial infection or co-infection with other pathogens not detected by the test. Clinical  correlation with patient history and other diagnostic information is necessary to determine patient infection status. The expected result is Negative.  Fact Sheet for Patients: EntrepreneurPulse.com.au  Fact Sheet for Healthcare Providers: IncredibleEmployment.be  This test is not yet approved or cleared by the [place] FDA and  has been authorized for detection and/or diagnosis of SARS-CoV-2 by FDA under an Emergency Use Authorization (EUA).  This EUA will remain in effect (meaning this test can be used) for the duration of  the COVID-19 declaration under Section 564(b)(1) of the A ct, 21 U.S.C. section 360bbb-3(b)(1), unless the authorization is terminated or revoked sooner.     Influenza A by PCR NEGATIVE NEGATIVE Final   Influenza B by PCR NEGATIVE NEGATIVE Final    Comment: (NOTE) The Xpert Xpress SARS-CoV-2/FLU/RSV plus assay is intended as an aid in the diagnosis of influenza from Nasopharyngeal swab specimens and should not be used as a sole basis for treatment. Nasal washings and aspirates are unacceptable for Xpert Xpress SARS-CoV-2/FLU/RSV testing.  Fact Sheet for Patients: EntrepreneurPulse.com.au  Fact Sheet for Healthcare Providers: IncredibleEmployment.be  This test is not yet approved or cleared by the [place] FDA and has been authorized for detection and/or diagnosis of SARS-CoV-2 by FDA under an Emergency Use Authorization (EUA). This EUA will remain in effect (meaning this test can be used) for the duration of the COVID-19 declaration under Section 564(b)(1) of the Act, 21 U.S.C. section 360bbb-3(b)(1), unless the authorization is terminated or revoked.  Performed at [facility], [place] [address], [place], [place] [address]  MRSA Next Gen by PCR, Nasal     Status: None   Collection Time: 11/24/21  3:07 AM   Specimen: Nasal Mucosa; Nasal Swab  Result Value Ref Range  Status   MRSA by PCR Next Gen NOT DETECTED NOT DETECTED Final    Comment: (NOTE) The GeneXpert MRSA Assay (FDA approved for NASAL specimens only), is one component of a comprehensive MRSA colonization surveillance program. It is not intended to diagnose MRSA infection nor to guide or monitor treatment for MRSA infections. Test performance is not FDA approved in patients less than 21 years old. Performed at [facility], [place] [address] [place],  [address]     Time coordinating discharge: Approximately 40 minutes  Patrecia Pour, MD  [facility] 11/28/2021, 2:14 PM

## 2021-11-28 NOTE — Progress Notes (Addendum)
Progress Note  Patient Name: Victoria Delgado Date of Encounter: 11/28/2021  [facility] Cardiologist: Rollene Rotunda, MD   Subjective   Patient states that she is feeling "back to normal".  Denies any palpitations, chest pain, shortness of breath, lightheadedness.  Reports that her symptoms of COVID have mostly cleared.  Continues to have some swelling in her legs.  Inpatient Medications    Scheduled Meds:  amiodarone  400 mg Oral BID   apixaban  5 mg Oral BID   dexamethasone  6 mg Oral Daily   furosemide  40 mg Oral Daily   guaiFENesin  1,200 mg Oral BID   insulin aspart  0-15 Units Subcutaneous TID PC & HS   metFORMIN  500 mg Oral BID WC   metoprolol succinate  50 mg Oral BID   omeprazole  20 mg Oral Daily   rosuvastatin  20 mg Oral Daily   Continuous Infusions:  sodium chloride 20 mL/hr at 11/28/21 0052   PRN Meds: acetaminophen **OR** acetaminophen, alum & mag hydroxide-simeth, metoprolol tartrate, phenol, sodium chloride   Vital Signs    Vitals:   11/27/21 2100 11/27/21 2241 11/28/21 0013 11/28/21 0542  BP: 120/78 120/90 (!) 115/100 136/75  Pulse: 89 75  70  Resp: (!) 25 (!) 22 18 18   Temp:  97.9 F (36.6 C) 97.7 F (36.5 C) 97.7 F (36.5 C)  TempSrc:  Oral Oral Oral  SpO2: 90% 97%  94%  Weight:      Height:        Intake/Output Summary (Last 24 hours) at 11/28/2021 0745 Last data filed at 11/28/2021 0052 Gross per 24 hour  Intake 1048.34 ml  Output 1350 ml  Net -301.66 ml   Last 3 Weights 11/27/2021 11/26/2021 11/25/2021  Weight (lbs) 285 lb 11.5 oz 280 lb 10.3 oz 282 lb 6.6 oz  Weight (kg) 129.6 kg 127.3 kg 128.1 kg      Telemetry    Patient remains in atrial fibrillation with a rate around 69 bpm.  Fastest heart rate is 95 bpm.- Personally Reviewed  ECG    No new tracings- Personally Reviewed  Physical Exam   GEN: No acute distress.   Neck: No JVD Cardiac: Irregular, no murmurs, rubs, or gallops.  Respiratory: Clear to auscultation  bilaterally. GI: Soft, nontender, non-distended  MS: 1+ pitting edema in BLE, No deformity. Neuro:  Nonfocal  Psych: Normal affect   Labs    High Sensitivity Troponin:   Recent Labs  Lab 11/23/21 0746 11/24/21 0051 11/24/21 0222 11/24/21 0325 11/24/21 0618  TROPONINIHS 2,985* 2,880* 2,722* 2,110* 2,759*     Chemistry Recent Labs  Lab 11/22/21 2229 11/23/21 0606 11/26/21 0211 11/27/21 0056 11/28/21 0339  NA 131*   < > 135 136 140  K 3.7   < > 4.5 4.4 4.2  CL 101   < > 103 104 103  CO2 21*   < > 23 23 28   GLUCOSE 153*   < > 244* 220* 157*  BUN 14   < > 18 19 19   CREATININE 1.04*   < > 0.87 1.06* 0.86  CALCIUM 7.9*   < > 8.3* 8.1* 8.3*  MG 1.9  --   --   --  2.2  PROT  --    < > 5.7* 5.3* 5.5*  ALBUMIN  --    < > 2.9* 2.8* 2.8*  AST  --    < > 41 29 20  ALT  --    < >  30 26 24   ALKPHOS  --    < > 58 58 53  BILITOT  --    < > 0.7 0.6 0.7  GFRNONAA 55*   < > >60 54* >60  ANIONGAP 9   < > 9 9 9    < > = values in this interval not displayed.    Lipids No results for input(s): CHOL, TRIG, HDL, LABVLDL, LDLCALC, CHOLHDL in the last 168 hours.  Hematology Recent Labs  Lab 11/26/21 0211 11/27/21 0056 11/28/21 0339  WBC 13.0* 11.5* 11.6*  RBC 4.21 4.25 4.26  HGB 12.7 12.9 13.1  HCT 39.1 39.1 39.0  MCV 92.9 92.0 91.5  MCH 30.2 30.4 30.8  MCHC 32.5 33.0 33.6  RDW 15.0 14.8 14.6  PLT 230 221 227   Thyroid  Recent Labs  Lab 11/23/21 0606  TSH 1.711    BNP Recent Labs  Lab 11/24/21 0051  BNP 609.0*    DDimer  Recent Labs  Lab 11/23/21 0606 11/24/21 0051  DDIMER 0.60* 0.43     Radiology    No results found.  Cardiac Studies   Echo 11/24/2021 1. Left ventricular ejection fraction, by estimation, is 65 to 70%. The  left ventricle has normal function. The left ventricle has no regional  wall motion abnormalities. There is moderate concentric left ventricular  hypertrophy. Left ventricular  diastolic function could not be evaluated.   2. Right  ventricular systolic function is normal. The right ventricular  size is normal. Tricuspid regurgitation signal is inadequate for assessing  PA pressure.   3. Left atrial size was severely dilated.   4. No evidence of mitral valve regurgitation. Moderate mitral stenosis.  The mean mitral valve gradient is 10.0 mmHg. Severe mitral annular  calcification.   5. The aortic valve has been repaired/replaced. There is a 23 mm Sapien  prosthetic (TAVR) valve present in the aortic position. Echo findings are  consistent with normal structure and function of the aortic valve  prosthesis. Aortic valve mean gradient  measures 12.7 mmHg. Aortic valve Vmax measures 2.50 m/s.   6. The inferior vena cava is dilated in size with >50% respiratory  variability, suggesting right atrial pressure of 8 mmHg.   Patient Profile     80 y.o. 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 10/2020.  Assessment & Plan   Elevated Troponin in setting of known CAD: Elevated but flat. No new LV wall motion abnormalities on Echo  -Likely due to demand ischemia, no invasive evaluation indicated at this time  Paroxysmal Atrial Fibrillation: Per telemetry, heart rate averages around 65 BPM  - On Eliquis, prior notes suggest consideration of watchman given prior hx of GIB - can review as outpatient, needs to discuss consideration of warfarin given moderate mitral stenosis  - Patient was transitioned from IV amiodarone to 400mg  p.o. twice daily on 12/26 -> will review plan for discharge dosing with MD - Started metoprolol succinate twice daily on 12/26, patient is usually on outpatient and does well with.  - Patient is asymptomatic - Since rate is now well controlled, anticipate DCCV as an outpatient if still in afib.  Hopefully the patient will convert back to sinus rhythm after  COVID resolves and cardioversion will not be necessary  Acute Diastolic HF with history of HOCM: BNP 609 on 11/24/21  after receiving IV fluids  -Patient continues to have leg swelling, reports that it has improved  -Continue to diurese with Lasix 40 mg tablet back to home dose  COVID: Tested positive on 11/22/21, symptoms started 11/20/2021 -Patient reports symptoms have resolved - Completed remdesivir after three doses on 11/25/21   Valvular heart disease s/p prior TAVR with moderate mitral stenosis: echo 12/23 with moderate MS, normal TAVR gradients - follow as outpatient      For questions or updates, please contact [facility] Please consult [email or web address] for contact info under        Signed, Jonita Albee, PA-C  11/28/2021, 7:45 AM    Patient seen and examined and agree with Robet Leu, PA-C as detailed above.  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 Cardiology is consulted regarding management of Afib.   Currently, HR much better controlled in 60-70s. Transitioned from amiodarone gtt>PO amio. Planned for continued rate control while recovering from COVID with  possible DCCV as an out-patient if remains out of rhythm.  Of note, patient has moderate MS on TTE and now merits warfarin for [facility] for valvular Afib. She understands and is amenable to switch. We have arranged for out-patient follow-up with the anticoagulation clinic to help with the transition. She will continue on apixaban on discharge until her follow-up appointment.  GEN: No acute distress.   Neck: No JVD Cardiac: RRR, no murmurs, rubs, or gallops.  Respiratory: Clear to auscultation bilaterally. GI: Soft, nontender, non-distended  MS: No edema; No deformity. Neuro:  Nonfocal  Psych: Normal affect   Plan: -Continue  amiodarone 400mg  BID x7 days, then 200mg  BID for 7 days and then 200mg  daily thereafter -If remains in Afib as an out-patient following recovery from COVID, can plan for DCCV at that time -Continue apixaban 5mg  BID; may merit warfarin as an out-patient due to moderate MS on TTE. We have arranged follow-up with anticoagulation clinic -Will discuss possible watchman given history of GIB and need for warfarin for AC -Continue metoprolol 50mg  BID -Management of COVID per primary  Okay to discharge on current medications from CV perspective.  , MD

## 2021-11-28 NOTE — Care Management Important Message (Signed)
Important Message  Patient Details  Name: Victoria Delgado MRN: [id number] Date of Birth: [birth year]   Medicare Important Message Given:  Yes     Renie Ora 11/28/2021, 8:52 AM

## 2021-11-29 ENCOUNTER — Encounter: Payer: Self-pay | Admitting: Cardiology

## 2021-11-29 ENCOUNTER — Other Ambulatory Visit (HOSPITAL_COMMUNITY): Payer: Self-pay | Admitting: Radiology

## 2021-11-29 DIAGNOSIS — R0602 Shortness of breath: Secondary | ICD-10-CM

## 2021-11-29 SURGERY — ECHOCARDIOGRAM, TRANSESOPHAGEAL
Anesthesia: General

## 2021-11-30 ENCOUNTER — Telehealth: Payer: Self-pay

## 2021-11-30 ENCOUNTER — Encounter (HOSPITAL_COMMUNITY): Payer: Medicare Other

## 2021-11-30 NOTE — Telephone Encounter (Signed)
Called pt concerning [facility] appt being canceled. Pt stated she was discharged on 11/28/21 and was scheduled an appt at [facility]; however, she is still taking Eliquis and wished to continue taking this medication unless otherwise instructed by Dr Antoine Poche. Per note, Gregor Hams, RN sent message to Dr Antoine Poche on 11/29/21 to make him aware.

## 2021-12-12 ENCOUNTER — Other Ambulatory Visit: Payer: Self-pay | Admitting: Internal Medicine

## 2021-12-12 DIAGNOSIS — Z0000 Encounter for general adult medical examination without abnormal findings: Secondary | ICD-10-CM

## 2021-12-13 ENCOUNTER — Encounter (HOSPITAL_COMMUNITY): Payer: Medicare Other

## 2021-12-17 ENCOUNTER — Encounter (HOSPITAL_BASED_OUTPATIENT_CLINIC_OR_DEPARTMENT_OTHER): Payer: Self-pay | Admitting: Emergency Medicine

## 2021-12-17 ENCOUNTER — Other Ambulatory Visit: Payer: Self-pay

## 2021-12-17 ENCOUNTER — Emergency Department (HOSPITAL_BASED_OUTPATIENT_CLINIC_OR_DEPARTMENT_OTHER)
Admission: EM | Admit: 2021-12-17 | Discharge: 2021-12-17 | Disposition: A | Discharge status: Home / Self Care | Payer: Medicare Other | Attending: Emergency Medicine | Admitting: Emergency Medicine

## 2021-12-17 DIAGNOSIS — R319 Hematuria, unspecified: Secondary | ICD-10-CM

## 2021-12-17 DIAGNOSIS — N390 Urinary tract infection, site not specified: Secondary | ICD-10-CM | POA: Diagnosis not present

## 2021-12-17 DIAGNOSIS — R300 Dysuria: Secondary | ICD-10-CM | POA: Diagnosis present

## 2021-12-17 LAB — URINALYSIS, ROUTINE W REFLEX MICROSCOPIC

## 2021-12-17 LAB — URINALYSIS, MICROSCOPIC (REFLEX): RBC / HPF: >50 RBC/hpf (ref 0–5)

## 2021-12-17 MED ORDER — FOSFOMYCIN TROMETHAMINE 3 G PO PACK
3.0000 g | PACK | Freq: Once | ORAL | Status: AC
  Administered 2021-12-17: 3 g via ORAL
  Filled 2021-12-17: qty 3

## 2021-12-17 NOTE — ED Triage Notes (Signed)
Pt arrives pov with c/o hematuria and dysuria today, concern for UTI. Denies fever

## 2021-12-17 NOTE — ED Notes (Signed)
PT tolerating PO fluids.

## 2021-12-17 NOTE — ED Provider Notes (Signed)
[facility] Provider Note   CSN: [id number] Arrival date & time: 12/17/21  1644     History  Chief Complaint  Patient presents with   Dysuria    Victoria Delgado is a 81 y.o. female.  The history is provided by the spouse and the patient. No language interpreter was used.  Dysuria Pain quality:  Burning Pain severity:  Moderate Onset quality:  Gradual Duration:  2 days Timing:  Constant Progression:  Unchanged Chronicity:  Recurrent Relieved by:  Nothing Worsened by:  Nothing Ineffective treatments:  None tried Urinary symptoms: discolored urine and hematuria   Associated symptoms: abdominal pain (mild suprapubic with the dysuria)   Associated symptoms: no fever, no flank pain, no nausea, no vaginal discharge and no vomiting   Risk factors: no hx of pyelonephritis, no kidney transplant, no recurrent urinary tract infections (not any in several years), no sexually transmitted infections and no single kidney       Home Medications Prior to Admission medications   Medication Sig Start Date End Date Taking? Authorizing Provider  acetaminophen (TYLENOL) 500 MG tablet Take 1,000 mg by mouth every 6 (six) hours as needed for moderate pain or headache.    [provider]  albuterol (VENTOLIN HFA) 108 (90 Base) MCG/ACT inhaler Inhale 2 puffs into the lungs every 4 (four) hours as needed for wheezing or shortness of breath.  12/21/19   [provider]  alum & mag hydroxide-simeth (MAALOX/MYLANTA) 200-200-20 MG/5ML suspension Take 30 mLs by mouth every 6 (six) hours as needed for indigestion or heartburn.    [provider]  amiodarone (PACERONE) 200 MG tablet Take 2 tablets (400 mg total) by mouth 2 (two) times daily for 7 days, THEN 1 tablet (200 mg total) 2 (two) times daily for 7 days, THEN 1 tablet (200 mg total) daily for 16 days. 11/28/21 12/28/21  Patrecia Pour, MD  apixaban (ELIQUIS) 5 MG TABS tablet Take 1 tablet (5 mg  total) by mouth 2 (two) times daily. 11/28/21   Patrecia Pour, MD  cholecalciferol (VITAMIN D3) 25 MCG (1000 UNIT) tablet Take 1,000 Units by mouth 2 (two) times daily.    [provider]  CINNAMON PO Take 1 capsule by mouth in the morning and at bedtime.    [provider]  CRESTOR 20 MG tablet TAKE 1 TABLET(20 MG) BY MOUTH DAILY Patient taking differently: Take 20 mg by mouth daily. 09/19/21   Minus Breeding, MD  furosemide (LASIX) 40 MG tablet Take 1 tablet (40 mg total) by mouth daily. 06/22/21   Minus Breeding, MD  loteprednol (LOTEMAX) 0.5 % ophthalmic suspension Place 1 drop into both eyes daily as needed (imflammation).     [provider]  metoprolol succinate (TOPROL XL) 50 MG 24 hr tablet Take 1 tablet (50 mg total) by mouth 2 (two) times daily. 11/28/21   Patrecia Pour, MD  Multiple Vitamin (MULTIVITAMIN) tablet Take 1 tablet by mouth daily.    [provider]  Multiple Vitamins-Minerals (ZINC PO) Take 1 tablet by mouth daily.    [provider]  nitroGLYCERIN (NITROSTAT) 0.4 MG SL tablet Place 1 tablet (0.4 mg total) under the tongue every 5 (five) minutes as needed for chest pain. 05/07/16   Burtis Junes, NP  omeprazole (PRILOSEC) 10 MG capsule Take 10 mg by mouth daily.    [provider]  umeclidinium-vilanterol (ANORO ELLIPTA) 62.5-25 MCG/ACT AEPB Inhale 1 puff into the lungs daily.  [provider]      Allergies    Codeine, Kenalog [triamcinolone acetonide], Relafen [nabumetone], Epinephrine, Penicillins, and Tramadol    Review of Systems   Review of Systems  Constitutional:  Negative for chills, diaphoresis, fatigue and fever.  HENT:  Negative for congestion.   Respiratory:  Negative for cough, chest tightness and shortness of breath.   Gastrointestinal:  Positive for abdominal pain (mild suprapubic with the dysuria). Negative for constipation, diarrhea, nausea and vomiting.  Genitourinary:  Positive for  dysuria and hematuria. Negative for flank pain, frequency, pelvic pain and vaginal discharge.  Musculoskeletal:  Negative for back pain, neck pain and neck stiffness.  Skin:  Negative for rash.  Neurological:  Negative for headaches.  All other systems reviewed and are negative.  Physical Exam Updated Vital Signs BP (!) 143/86    Pulse (!) 119    Temp 98.4 F (36.9 C) (Oral)    Resp 18    Ht 5\' 8"  (1.727 m)    Wt 122.5 kg    SpO2 99%    BMI 41.05 kg/m  Physical Exam Vitals and nursing note reviewed.  Constitutional:      General: She is not in acute distress.    Appearance: She is well-developed. She is not ill-appearing, toxic-appearing or diaphoretic.  HENT:     Head: Normocephalic and atraumatic.     Nose: No congestion or rhinorrhea.     Mouth/Throat:     Pharynx: No oropharyngeal exudate or posterior oropharyngeal erythema.  Eyes:     Extraocular Movements: Extraocular movements intact.     Conjunctiva/sclera: Conjunctivae normal.     Pupils: Pupils are equal, round, and reactive to light.  Cardiovascular:     Rate and Rhythm: Normal rate and regular rhythm.     Heart sounds: No murmur heard. Pulmonary:     Effort: Pulmonary effort is normal. No respiratory distress.     Breath sounds: Normal breath sounds. No wheezing, rhonchi or rales.  Chest:     Chest wall: No tenderness.  Abdominal:     General: Abdomen is flat.     Palpations: Abdomen is soft.     Tenderness: There is no abdominal tenderness. There is no right CVA tenderness, left CVA tenderness, guarding or rebound.  Musculoskeletal:        General: No swelling or tenderness.     Cervical back: Neck supple.  Skin:    General: Skin is warm and dry.     Capillary Refill: Capillary refill takes less than 2 seconds.     Findings: No erythema.  Neurological:     Mental Status: She is alert.  Psychiatric:        Mood and Affect: Mood normal.    ED Results / Procedures / Treatments   Labs (all labs ordered are  listed, but only abnormal results are displayed) Labs Reviewed  URINALYSIS, ROUTINE W REFLEX MICROSCOPIC - Abnormal; Notable for the following components:      Result Value   Color, Urine RED (*)    APPearance TURBID (*)    Glucose, UA   (*)    Value: TEST NOT REPORTED DUE TO COLOR INTERFERENCE OF URINE PIGMENT   Hgb urine dipstick   (*)    Value: TEST NOT REPORTED DUE TO COLOR INTERFERENCE OF URINE PIGMENT   Bilirubin Urine   (*)    Value: TEST NOT REPORTED DUE TO COLOR INTERFERENCE OF URINE PIGMENT   Ketones, ur   (*)  Value: TEST NOT REPORTED DUE TO COLOR INTERFERENCE OF URINE PIGMENT   Protein, ur   (*)    Value: TEST NOT REPORTED DUE TO COLOR INTERFERENCE OF URINE PIGMENT   Nitrite   (*)    Value: TEST NOT REPORTED DUE TO COLOR INTERFERENCE OF URINE PIGMENT   Leukocytes,Ua   (*)    Value: TEST NOT REPORTED DUE TO COLOR INTERFERENCE OF URINE PIGMENT   All other components within normal limits  URINALYSIS, MICROSCOPIC (REFLEX) - Abnormal; Notable for the following components:   Bacteria, UA MANY (*)    All other components within normal limits  URINE CULTURE    EKG None  Radiology No results found.  Procedures Procedures    Medications Ordered in ED Medications  fosfomycin (MONUROL) packet 3 g (3 g Oral Given 12/17/21 2024)    ED Course/ Medical Decision Making/ A&P                           Medical Decision Making  AVARAE LENGERICH is a 81 y.o. female with a past medical history significant for hypertension, HOCM, hyperlipidemia, atrial fibrillation on Eliquis therapy, and previous UTI who presents with hematuria and dysuria.  According to patient, for the last 24 hours she has felt a burning and sharp pain with urination as well as seeing some hematuria.  She reports this is the "exact same" as when she had a UTI several years ago.  She says that the bleeding is likely also due to the new Eliquis she has been on since diagnosed with A. fib.  She is denying any other  abdominal pain.  She denies any fevers, chills, chest pain, shortness of breath, nausea, vomiting, or other symptoms.  On exam, lungs clear and chest nontender.  Abdomen was nontender.  Normal bowel sounds.  Patient well-appearing and is still in A. fib.  Vital signs otherwise reassuring.  Patient had urinalysis that does show evidence of UTI with many bacteria and cloudiness.  Blood obscured other findings but this is similar to what it was in the past when she had UTI.  Had a conversation with patient and given her reassuring vital signs and lack of other symptoms we agreed to hold on imaging or other labs at this time.  She denies any flank pain or back pain and she has no history of kidney stones.  She does not want any imaging.   Patient was initially hesitant for a prescription for oral antibiotic for the UTI because she reports it messes with her stomach and may cause bleeding given her Eliquis.  We discussed a single dose of fosfomycin and she is amenable to this.  We will give a dose of fosfomycin and p.o. challenge.  She is feeling well and tolerates it, will discharge home for PCP follow-up for UTI.  Patient understands return precautions and follow-up instructions and anticipate discharge shortly.  Patient tolerated fosfomycin and passed p.o. challenge.  She is feeling well.  Will discharge home to follow-up with PCP and she understands return precautions.  She no other questions or concerns and was discharged in good condition.         Final Clinical Impression(s) / ED Diagnoses Final diagnoses:  Lower urinary tract infectious disease  Hematuria, unspecified type    Rx / DC Orders ED Discharge Orders     None      Clinical Impression: 1. Lower urinary tract infectious disease   2.  Hematuria, unspecified type     Disposition: Discharge  Condition: Good  I have discussed the results, Dx and Tx plan with the pt(& family if present). He/she/they expressed  understanding and agree(s) with the plan. Discharge instructions discussed at great length. Strict return precautions discussed and pt &/or family have verbalized understanding of the instructions. No further questions at time of discharge.    New Prescriptions   No medications on file    Follow Up: Sueanne Margarita, [place] [id number] [phone]     [place] [address] [id number] [place]        Mckensi Redinger, Gwenyth Allegra, MD 12/17/21 [phone]

## 2021-12-17 NOTE — Discharge Instructions (Signed)
Your history, exam, work-up today are consistent with a urinary tract infection similar to several years ago.  As your vital signs were reassuring on monitoring and given the similarity to prior infection, we agreed together to hold on more extensive lab testing or imaging today.  Your urine did show evidence of infection and we sent a culture.  We had a shared decision-making conversation about management and landed on doing the oral dose of fosfomycin here to treat with antibiotics.  It is a one-time medication based on the location and symptoms you are having.  We also discussed that this should have less irritation to your GI tract as oral antibiotics have done in the past for you.  Please follow-up with your PCP and rest and stay hydrated.  If any symptoms change or worsen acutely, please return to the nearest emergency department.

## 2021-12-20 LAB — URINE CULTURE: Culture: >=100000 — AB

## 2021-12-21 ENCOUNTER — Telehealth: Payer: Self-pay | Admitting: *Deleted

## 2021-12-21 ENCOUNTER — Ambulatory Visit (HOSPITAL_COMMUNITY)
Admit: 2021-12-21 | Discharge: 2021-12-21 | Disposition: A | Discharge status: Home / Self Care | Payer: Medicare Other | Attending: Physician Assistant | Admitting: Physician Assistant

## 2021-12-21 ENCOUNTER — Other Ambulatory Visit: Payer: Self-pay

## 2021-12-21 VITALS — BP 150/80 | HR 111 | Ht 68.0 in | Wt 279.8 lb

## 2021-12-21 DIAGNOSIS — I519 Heart disease, unspecified: Secondary | ICD-10-CM | POA: Diagnosis not present

## 2021-12-21 DIAGNOSIS — I2510 Atherosclerotic heart disease of native coronary artery without angina pectoris: Secondary | ICD-10-CM | POA: Insufficient documentation

## 2021-12-21 DIAGNOSIS — D6869 Other thrombophilia: Secondary | ICD-10-CM

## 2021-12-21 DIAGNOSIS — E669 Obesity, unspecified: Secondary | ICD-10-CM | POA: Diagnosis not present

## 2021-12-21 DIAGNOSIS — I421 Obstructive hypertrophic cardiomyopathy: Secondary | ICD-10-CM | POA: Insufficient documentation

## 2021-12-21 DIAGNOSIS — Z6841 Body Mass Index (BMI) 40.0 and over, adult: Secondary | ICD-10-CM | POA: Diagnosis not present

## 2021-12-21 DIAGNOSIS — I4819 Other persistent atrial fibrillation: Secondary | ICD-10-CM

## 2021-12-21 DIAGNOSIS — G4733 Obstructive sleep apnea (adult) (pediatric): Secondary | ICD-10-CM | POA: Diagnosis not present

## 2021-12-21 DIAGNOSIS — Z7901 Long term (current) use of anticoagulants: Secondary | ICD-10-CM | POA: Insufficient documentation

## 2021-12-21 LAB — BASIC METABOLIC PANEL
Anion gap: 10 (ref 5–15)
BUN: 12 mg/dL (ref 8–23)
CO2: 29 mmol/L (ref 22–32)
Calcium: 9.5 mg/dL (ref 8.9–10.3)
Chloride: 106 mmol/L (ref 98–111)
Creatinine, Ser: 1.03 mg/dL — ABNORMAL HIGH (ref 0.44–1.00)
GFR, Estimated: 56 mL/min — ABNORMAL LOW (ref >60)
Glucose, Bld: 151 mg/dL — ABNORMAL HIGH (ref 70–99)
Potassium: 5.2 mmol/L — ABNORMAL HIGH (ref 3.5–5.1)
Sodium: 145 mmol/L (ref 135–145)

## 2021-12-21 LAB — CBC
HCT: 44.3 % (ref 36.0–46.0)
Hemoglobin: 14.1 g/dL (ref 12.0–15.0)
MCH: 29.9 pg (ref 26.0–34.0)
MCHC: 31.8 g/dL (ref 30.0–36.0)
MCV: 93.9 fL (ref 80.0–100.0)
Platelets: 193 10*3/uL (ref 150–400)
RBC: 4.72 MIL/uL (ref 3.87–5.11)
RDW: 14.7 % (ref 11.5–15.5)
WBC: 6.1 10*3/uL (ref 4.0–10.5)
nRBC: 0 % (ref 0.0–0.2)

## 2021-12-21 MED ORDER — AMIODARONE HCL 200 MG PO TABS: 200.0000 mg | ORAL_TABLET | Freq: Every day | ORAL | 3 refills | Status: DC

## 2021-12-21 NOTE — Telephone Encounter (Signed)
Post ED Visit - Positive Culture Follow-up  Culture report reviewed by antimicrobial stewardship pharmacist: [facility] []  , Pharm.D. [x]  Enzo Bi, Pharm.D., BCPS AQ-ID []  , Pharm.D., BCPS []  Celedonio Miyamoto, .D., BCPS []  [place], .D., BCPS, AAHIVP []  Georgina Pillion, Pharm.D., BCPS, AAHIVP []  [address], PharmD, BCPS []  , PharmD, BCPS []  [place], PharmD, BCPS []  [address], PharmD []  , PharmD, BCPS []  Estella Husk, PharmD  Pharmacy Team []  Lysle Pearl, PharmD []  , PharmD []  Phillips Climes, PharmD []  , Rph []  Agapito Games) , PharmD []  Verlan Friends, PharmD []  , PharmD []  Mervyn Gay, PharmD []  , PharmD []  Vinnie Level, PharmD []  [facility], PharmD []  , PharmD []  Len Childs, PharmD   Positive urine culture Treated with Fosfomycin 3g x 1 in ED, organism sensitive to the same and no further patient follow-up is required at this time.  [facility] 12/21/2021, 10:23 AM

## 2021-12-21 NOTE — Progress Notes (Signed)
Primary Care Physician: Sueanne Margarita, DO Primary Cardiologist: Dr Percival Spanish  Primary Electrophysiologist: none Referring Physician: Dr Patriciaann Clan Victoria Delgado is a 81 y.o. 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 Presented to ER 11/23/21 with chest pain for 1 hour, she thought her atrial fib was starting back up. HR was 140 by EMS. In ER given Cardizem bout with drop in BP to [id number] systolic and then given fluid bolus.  Also noted she had been sick for 3-4 days prior to presentation.  Cough, congestion and fever and found to be COVID +. Once HR was slowed pt's chest pain resolved. She was also acutely fluid overloaded and started on diuresis. Patient is on Eliquis for a CHADS2VASC score of 5. She was started on amiodarone with plan for outpatient DCCV.   Today, patient reports she remains fatigued and SOB in afib. Her smart watch at home shows HR 60s-110s. She denies any missed doses of anticoagulation.   Today, she denies symptoms of chest pain, orthopnea, PND, lower extremity edema, dizziness, presyncope, syncope, snoring, daytime somnolence, bleeding, or neurologic sequela. The patient is tolerating medications without difficulties and is otherwise without complaint today.    Atrial Fibrillation Risk Factors:  she does have symptoms or diagnosis of sleep apnea. she is compliant with CPAP therapy. she does not have a history of rheumatic fever.   she has a BMI of Body mass index is 42.54 kg/m.Marland Kitchen Filed Weights   12/21/21 1111  Weight: 126.9 kg    Family History  Problem Relation Age of Onset   Hypertension Mother    CVA Mother    Lung cancer Father 62       asbestos exposure   Cancer Father    Esophageal cancer Other        nephew   Diabetes Other        nephew   Colon cancer Neg Hx    Pancreatic cancer Neg Hx     Kidney disease Neg Hx    Liver disease Neg Hx      Atrial Fibrillation Management history:  Previous antiarrhythmic drugs: amiodarone  Previous cardioversions: none Previous ablations: none CHADS2VASC score: 5 Anticoagulation history: Eliquis   Past Medical History:  Diagnosis Date   Abdominal pain    Arthritis    osteoarthritis-hips,knees, back, hands   Asymmetric septal hypertrophy (HCC)    Atrial fibrillation (HCC)    CAD (coronary artery disease)    2 drug-eluting stents placed in the circumflex leading into a marginal.  May 2017.  [name].   catheterization on 03/19/2018, this showed patent stent in the proximal to mid left circumflex artery, 20% ostial left circumflex artery disease, 40% OM 3 disease, widely patent stent in the ostial D1, 20% ostial to proximal LAD disease, 20% mid LAD disease.   Cataract    corrective surgery done   Fatty liver    Gallstones    GERD (gastroesophageal reflux disease)    GERD (gastroesophageal reflux disease)    Heart palpitations    Hepatitis    hepatitis A; past hx.,many yrs ago ? food source   Hernia, hiatal    Hyperlipidemia    Hypertension    Obesity    Sleep apnea    CPAP   Past Surgical History:  Procedure Laterality Date   APPENDECTOMY  2004   CATARACT EXTRACTION W/ INTRAOCULAR LENS IMPLANT     both eyes   CHOLECYSTECTOMY     LEFT HEART CATH AND CORONARY ANGIOGRAPHY N/A 03/19/2018   Procedure: LEFT HEART CATH AND CORONARY ANGIOGRAPHY;  Surgeon: Burnell Blanks, MD;  Location: [facility];  Service: Cardiovascular;  Laterality: N/A;   NM MYOCAR PERF WALL MOTION  08/15/04   No ischemia   RETINAL DETACHMENT SURGERY Bilateral    remains with slight hazy vision with lights   RIGHT/LEFT HEART CATH AND CORONARY ANGIOGRAPHY N/A 07/11/2020   Procedure: RIGHT/LEFT HEART CATH AND CORONARY ANGIOGRAPHY;  Surgeon: Sherren Mocha, MD;  Location: [facility];  Service: Cardiovascular;  Laterality: N/A;    TOTAL HIP ARTHROPLASTY Right 08/04/2014   Procedure: RIGHT TOTAL HIP ARTHROPLASTY ANTERIOR APPROACH;  Surgeon: Gearlean Alf, MD;  Location: [facility];  Service: Orthopedics;  Laterality: Right;   TOTAL HIP ARTHROPLASTY Left 01/05/2015   Procedure: LEFT TOTAL HIP ARTHROPLASTY ANTERIOR APPROACH;  Surgeon: Gearlean Alf, MD;  Location: [facility];  Service: Orthopedics;  Laterality: Left;   US ECHOCARDIOGRAPHY  06/25/2012   Moderate ASH,LV hyperdynamic,LA is mod. dilated,trace MR,TR,AI    Current Outpatient Medications  Medication Sig Dispense Refill   acetaminophen (TYLENOL) 500 MG tablet Take 1,000 mg by mouth every 6 (six) hours as needed for moderate pain or headache.     albuterol (VENTOLIN HFA) 108 (90 Base) MCG/ACT inhaler Inhale 2 puffs into the lungs every 4 (four) hours as needed for wheezing or shortness of breath.      alum & mag hydroxide-simeth (GELUSIL) 200-200-25 MG chewable tablet Taking 2-4 chewables by mouth daily     amiodarone (PACERONE) 200 MG tablet Take 2 tablets (400 mg total) by mouth 2 (two) times daily for 7 days, THEN 1 tablet (200 mg total) 2 (two) times daily for 7 days, THEN 1 tablet (200 mg total) daily for 16 days. 58 tablet 0   apixaban (ELIQUIS) 5 MG TABS tablet Take 1 tablet (5 mg total) by mouth 2 (two) times daily. 60 tablet 0   cholecalciferol (VITAMIN D3) 25 MCG (1000 UNIT) tablet Take 1,000 Units by mouth 2 (two) times daily.     CINNAMON PO Take 1 capsule by mouth in the morning and at bedtime.     CRESTOR 20 MG tablet TAKE 1 TABLET(20 MG) BY MOUTH DAILY (Patient taking differently: Take 20 mg by mouth daily.) 90 tablet 3   furosemide (LASIX) 40 MG tablet Take 1 tablet (40 mg total) by mouth daily. 90 tablet 3   metoprolol succinate (TOPROL XL) 50 MG 24 hr tablet Take 1 tablet (50 mg total) by mouth 2 (two) times daily. 60 tablet 0   Multiple Vitamin (MULTIVITAMIN) tablet Take 1 tablet by mouth daily.     nitroGLYCERIN (NITROSTAT) 0.4 MG SL tablet Place 1 tablet  (0.4 mg total) under the tongue every 5 (five) minutes as needed for chest pain. 25 tablet 3   omeprazole (PRILOSEC) 10 MG capsule Take 20 mg by mouth daily.     ezetimibe (ZETIA) 10 MG tablet Take 10 mg by mouth daily. (Patient not taking: Reported on 12/21/2021)     loteprednol (LOTEMAX) 0.5 % ophthalmic suspension Place 1 drop into both eyes daily as needed (imflammation).  (Patient not taking: Reported on 12/21/2021)     Multiple Vitamins-Minerals (ZINC PO) Take 1 tablet by mouth daily. (Patient not taking: Reported on 12/21/2021)     umeclidinium-vilanterol (ANORO ELLIPTA) 62.5-25  MCG/ACT AEPB Inhale 1 puff into the lungs daily. (Patient not taking: Reported on 12/21/2021)     No current facility-administered medications for this encounter.    Allergies  Allergen Reactions   Codeine Nausea Only   Kenalog [Triamcinolone Acetonide] Other (See Comments)    Had A.fib after she received the injection."Steroid meds"   Relafen [Nabumetone] Other (See Comments)    Edema    Epinephrine Palpitations   Penicillins Rash    40 years ago, injection site was red and swollen.  rash, facial/tongue/throat swelling, SOB or lightheadedness with hypotension: Yes Has patient had a PCN reaction causing immediate  Has patient had a PCN reaction causing severe rash involving mucus membranes or skin necrosis: NO Has patient had a PCN reaction that required hospitalization. No  Has patient had a PCN reaction occurring within the last 10 years: No If all of the above answers are "NO", then may proceed with Cephalosporin use.     Tramadol Other (See Comments)    Auditory halllucinations    Social History   Socioeconomic History   Marital status: Married    Spouse name: Not on file   Number of children: 2   Years of education: Not on file   Highest education level: Not on file  Occupational History    Employer: OTHER  Tobacco Use   Smoking status: Former   Smokeless tobacco: Never   Tobacco comments:     QUIT IN 1990  Vaping Use   Vaping Use: Never used  Substance and Sexual Activity   Alcohol use: No    Alcohol/week: 0.0 standard drinks   Drug use: No   Sexual activity: Yes  Other Topics Concern   Not on file  Social History Narrative   Two grandchildren.            Social Determinants of Health   Financial Resource Strain: Not on file  Food Insecurity: Not on file  Transportation Needs: Not on file  Physical Activity: Not on file  Stress: Not on file  Social Connections: Not on file  Intimate Partner Violence: Not on file     ROS- All systems are reviewed and negative except as per the HPI above.  Physical Exam: Vitals:   12/21/21 1111  BP: (!) 150/80  Pulse: (!) 111  Weight: 126.9 kg  Height: 5\' 8"  (1.727 m)    GEN- The patient is a well appearing obese elderly female, alert and oriented x 3 today.   Head- normocephalic, atraumatic Eyes-  Sclera clear, conjunctiva pink Ears- hearing intact Oropharynx- clear Neck- supple  Lungs- Clear to ausculation bilaterally, normal work of breathing Heart- irregular rate and rhythm, no murmurs, rubs or gallops  GI- soft, NT, ND, + BS Extremities- no clubbing, cyanosis, or edema MS- no significant deformity or atrophy Skin- no rash or lesion Psych- euthymic mood, full affect Neuro- strength and sensation are intact  Wt Readings from Last 3 Encounters:  12/21/21 126.9 kg  12/17/21 122.5 kg  11/27/21 129.6 kg    EKG today demonstrates  Afib Vent. rate 111 BPM PR interval * ms QRS duration 94 ms QT/QTcB 360/489 ms  Echo 11/24/21 demonstrated   1. Left ventricular ejection fraction, by estimation, is 65 to 70%. The  left ventricle has normal function. The left ventricle has no regional  wall motion abnormalities. There is moderate concentric left ventricular  hypertrophy. Left ventricular diastolic function could not be evaluated.   2. Right ventricular systolic function is normal. The  right ventricular   size is normal. Tricuspid regurgitation signal is inadequate for assessing PA pressure.   3. Left atrial size was severely dilated.   4. No evidence of mitral valve regurgitation. Moderate mitral stenosis.  The mean mitral valve gradient is 10.0 mmHg. Severe mitral annular  calcification.   5. The aortic valve has been repaired/replaced. There is a 23 mm Sapien prosthetic (TAVR) valve present in the aortic position. Echo findings are consistent with normal structure and function of the aortic valve prosthesis. Aortic valve mean gradient  measures 12.7 mmHg. Aortic valve Vmax measures 2.50 m/s.   6. The inferior vena cava is dilated in size with >50% respiratory  variability, suggesting right atrial pressure of 8 mmHg.   Comparison(s): Mitral stenosis gradients are higher, mostly due to atrial  fibrillation with rapid ventricular rate. There is a new TAVR that appears  to have normal function, baseline gradients are not available for  comparison in our system, but are lower  than reported in 11/03/2021 echo from [facility].  Epic records are reviewed at length today  CHA2DS2-VASc Score = 5  The patient's score is based upon: CHF History: 0 HTN History: 1 Diabetes History: 0 Stroke History: 0 Vascular Disease History: 1 Age Score: 2 Gender Score: 1       ASSESSMENT AND PLAN: 1. Persistent Atrial Fibrillation (ICD10:  I48.19) The patient's CHA2DS2-VASc score is 5, indicating a 7.2% annual risk of stroke.   Patient remains in symptomatic afib, heart rates overall controlled at home.  Will plan for TEE/DCCV. Check bmet/cbc today.  She was noted to have moderate mitral stenosis on her last echo. Warfarin was recommended but she wished to continue on Eliquis until first speaking with Dr Percival Spanish. If her MS is not severe enough to warrant warfarin, could proceed with DCCV without TEE as she will have been on Eliquis >3 weeks. If she is changed to warfarin, would proceed with TEE guided DCCV  once her INR is therapeutic.  Continue amiodarone 200 mg daily Continue Toprol 50 mg BID Continue Eliquis 5 mg BID for now.  2. Secondary Hypercoagulable State (ICD10:  D68.69) The patient is at significant risk for stroke/thromboembolism based upon her CHA2DS2-VASc Score of 5.  Continue Apixaban (Eliquis). With her h/o GI bleeding on anticoagulation, she may be a good candidate for Watchman procedure. However, her h/o HCM places her at high risk for stroke off anticoagulation. Also, her moderate MS this may be a disincentive for Watchman as well. We discussed the device today and she is agreeable. She will d/w Dr Percival Spanish for shared decision making.   3. Obesity Body mass index is 42.54 kg/m. Lifestyle modification was discussed at length including regular exercise and weight reduction.  4. Obstructive sleep apnea The importance of adequate treatment of sleep apnea was discussed today in order to improve our ability to maintain sinus rhythm long term. Patient reports compliance with CPAP therapy.   5. CAD No anginal symptoms.  6. HCM Continue BB as above.  7. Valvular heart disease Severe AS s/p TAVR Moderate MS   Follow up with Dr Percival Spanish next week as scheduled.    [facility] [address] [place], [place] [address] [phone] 12/21/2021 11:40 AM

## 2021-12-21 NOTE — Patient Instructions (Addendum)
Amiodarone 200mg  once a day   Cardioversion scheduled for Wednesday, February 1st  - Arrive at the [facility] and go to admitting at 1230PM  - Do not eat or drink anything after midnight the night prior to your procedure.  - Take all your morning medication (except diabetic medications) with a sip of water prior to arrival.  - You will not be able to drive home after your procedure.  - Do NOT miss any doses of your blood thinner - if you should miss a dose please notify our office immediately.  - If you feel as if you go back into normal rhythm prior to scheduled cardioversion, please notify our office immediately. If your procedure is canceled in the cardioversion suite you will be charged a cancellation fee. Patients will be asked to: to mask in public and hand hygiene (no longer quarantine) in the 3 days prior to surgery, to report if any COVID-19-like illness or household contacts to COVID-19 to determine need for testing

## 2021-12-24 ENCOUNTER — Encounter: Payer: Self-pay | Admitting: Internal Medicine

## 2021-12-24 ENCOUNTER — Encounter: Payer: Self-pay | Admitting: Cardiology

## 2021-12-25 ENCOUNTER — Other Ambulatory Visit: Payer: Self-pay

## 2021-12-25 ENCOUNTER — Telehealth: Payer: Self-pay

## 2021-12-25 MED ORDER — OMEPRAZOLE 10 MG PO CPDR: 20.0000 mg | DELAYED_RELEASE_CAPSULE | Freq: Every day | ORAL | 6 refills | Status: DC

## 2021-12-25 MED ORDER — APIXABAN 5 MG PO TABS: 5.0000 mg | ORAL_TABLET | Freq: Two times a day (BID) | ORAL | 5 refills | Status: DC

## 2021-12-25 NOTE — Telephone Encounter (Signed)
Prescription refill request for Eliquis received. Indication:Afib Last office visit:12/22 Scr:1.0 Age: 81 Weight:126.9 kg  Prescription refilled

## 2021-12-25 NOTE — Telephone Encounter (Signed)
Refilled Omeprazole 

## 2021-12-26 ENCOUNTER — Other Ambulatory Visit: Payer: Self-pay

## 2021-12-26 ENCOUNTER — Encounter (HOSPITAL_COMMUNITY): Payer: Self-pay | Admitting: Cardiovascular Disease

## 2021-12-26 DIAGNOSIS — R0602 Shortness of breath: Secondary | ICD-10-CM | POA: Insufficient documentation

## 2021-12-26 MED ORDER — METOPROLOL SUCCINATE ER 50 MG PO TB24: 50.0000 mg | ORAL_TABLET | Freq: Two times a day (BID) | ORAL | 0 refills | Status: DC

## 2021-12-26 NOTE — H&P (View-Only) (Signed)
°  °Cardiology Office Note ° ° °Date:  12/27/2021  ° °ID:  Victoria Delgado, DOB 11/15/1941, MRN [id number] ° °PCP:  Skakle, [place], DO  °Cardiologist:   Jadien Lehigh, MD ° ° °Chief Complaint  °Patient presents with  ° Shortness of Breath  ° ° °  °History of Present Illness: °Victoria Delgado is a 81 y.o. female who presents for follow up of CAD.  She has atrial fibrillation which she ultimately thinks was related to a steroid injection. This was in 2008. At that time she was found to have asymmetric septal hypertrophy. This has been followed conservatively.  Echocardiogram did demonstrate concentric LV hypertrophy which was moderate and moderate LAE.  In May of 2017 she was in the hospital with CAD.  She was admitted to [facility] on 5/24 after waking up with left arm pain.  She had positive troponin with  PCI to the mid LCX and 1st DX. She is on DAPT with aspirin/Effient.  She had 2 drug-eluting stents placed in the circumflex leading into a marginal. This was apparently somewhat small circumflex. She also had a diagonal had a drug-eluting stent placed.  An echo in our office showed well preserved ejection fraction. There was some moderate aortic stenosis. There was no mention of hypertrophic obstruction although there is LVH.  She was in the hospital in April 2019 with chest pain.    She did have positive cardiac enzymes.   She underwent cardiac catheterization on 03/19/2018, this showed patent stent in the proximal to mid left circumflex artery, 20% ostial left circumflex artery disease, 40% OM 3 disease, widely patent stent in the ostial D1, 20% ostial to proximal LAD disease, 20% mid LAD disease.  Cardiac catheterization did confirm moderate aortic stenosis with mean gradient of 26.7 mmHg.  Medical therapy was recommended for her coronary artery disease.  She had a follow up echo and her AS was severe to critical.  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  (23 mm Edwards Sapien S3 Ultra) ° °Since I last saw her she was  in the hospital in December with COVID.  I reviewed these records for this visit.  She had atrial fibrillation with rapid ventricular response.  She was treated with IV amiodarone and sent home on oral amiodarone.  She was treated with anticoagulation.  She had a urinary infection and was in the ER for this.  I do note that she had elevated troponin but this was thought to be demand ischemia.  She says she is slowly recovering from COVID.  She is not having any new shortness of breath, PND or orthopnea.  She is not having any cough.  She is not feeling her heart racing or skipping.  She is not having any chest pressure, neck or arm discomfort.  She has been tolerating her anticoagulation despite her previous history of GI bleeding.  Of note she had normal TAVR gradients with moderate mitral stenosis on her echo at the time of her admission in December. ° ° °Past Medical History:  °Diagnosis Date  ° Abdominal pain   ° Arthritis   ° osteoarthritis-hips,knees, back, hands  ° Asymmetric septal hypertrophy (HCC)   ° Atrial fibrillation (HCC)   ° CAD (coronary artery disease)   ° 2 drug-eluting stents placed in the circumflex leading into a marginal.  May 2017.  [facility].   catheterization on 03/19/2018, this showed patent stent in the proximal to mid left circumflex artery, 20% ostial left circumflex artery disease,   40% OM 3 disease, widely patent stent in the ostial D1, 20% ostial to proximal LAD disease, 20% mid LAD disease.   Cataract    corrective surgery done   Fatty liver    Gallstones    GERD (gastroesophageal reflux disease)    GERD (gastroesophageal reflux disease)    Heart palpitations    Hepatitis    hepatitis A; past hx.,many yrs ago ? food source   Hernia, hiatal    Hyperlipidemia    Hypertension    Obesity    Sleep apnea    CPAP    Past Surgical History:  Procedure Laterality Date   APPENDECTOMY  2004   CATARACT EXTRACTION W/ INTRAOCULAR LENS IMPLANT     both eyes   CHOLECYSTECTOMY      LEFT HEART CATH AND CORONARY ANGIOGRAPHY N/A 03/19/2018   Procedure: LEFT HEART CATH AND CORONARY ANGIOGRAPHY;  Surgeon: Burnell Blanks, MD;  Location: [facility];  Service: Cardiovascular;  Laterality: N/A;   NM MYOCAR PERF WALL MOTION  08/15/04   No ischemia   RETINAL DETACHMENT SURGERY Bilateral    remains with slight hazy vision with lights   RIGHT/LEFT HEART CATH AND CORONARY ANGIOGRAPHY N/A 07/11/2020   Procedure: RIGHT/LEFT HEART CATH AND CORONARY ANGIOGRAPHY;  Surgeon: Sherren Mocha, MD;  Location: [facility];  Service: Cardiovascular;  Laterality: N/A;   TOTAL HIP ARTHROPLASTY Right 08/04/2014   Procedure: RIGHT TOTAL HIP ARTHROPLASTY ANTERIOR APPROACH;  Surgeon: Gearlean Alf, MD;  Location: [facility];  Service: Orthopedics;  Laterality: Right;   TOTAL HIP ARTHROPLASTY Left 01/05/2015   Procedure: LEFT TOTAL HIP ARTHROPLASTY ANTERIOR APPROACH;  Surgeon: Gearlean Alf, MD;  Location: [facility];  Service: Orthopedics;  Laterality: Left;   US ECHOCARDIOGRAPHY  06/25/2012   Moderate ASH,LV hyperdynamic,LA is mod. dilated,trace MR,TR,AI     Current Outpatient Medications  Medication Sig Dispense Refill   acetaminophen (TYLENOL) 500 MG tablet Take 1,000 mg by mouth every 6 (six) hours as needed for moderate pain or headache.     albuterol (VENTOLIN HFA) 108 (90 Base) MCG/ACT inhaler Inhale 2 puffs into the lungs every 4 (four) hours as needed for wheezing or shortness of breath.      alum & mag hydroxide-simeth (GELUSIL) [id number] MG chewable tablet Taking 2-4 chewables by mouth daily     amiodarone (PACERONE) 200 MG tablet Take 1 tablet (200 mg total) by mouth daily. 30 tablet 3   apixaban (ELIQUIS) 5 MG TABS tablet Take 1 tablet (5 mg total) by mouth 2 (two) times daily. 60 tablet 5   cholecalciferol (VITAMIN D3) 25 MCG (1000 UNIT) tablet Take 1,000 Units by mouth 2 (two) times daily.     CINNAMON PO Take 1 capsule by mouth in the morning and at bedtime.      CRESTOR 20 MG tablet TAKE 1 TABLET(20 MG) BY MOUTH DAILY (Patient taking differently: Take 20 mg by mouth daily.) 90 tablet 3   ezetimibe (ZETIA) 10 MG tablet Take 10 mg by mouth daily.     furosemide (LASIX) 40 MG tablet Take 1 tablet (40 mg total) by mouth daily. 90 tablet 3   loteprednol (LOTEMAX) 0.5 % ophthalmic suspension Place 1 drop into both eyes daily as needed (imflammation).     metoprolol succinate (TOPROL XL) 50 MG 24 hr tablet Take 1 tablet (50 mg total) by mouth 2 (two) times daily. 60 tablet 0   Multiple Vitamin (MULTIVITAMIN) tablet Take 1 tablet by mouth daily.  Multiple Vitamins-Minerals (ZINC PO) Take 1 tablet by mouth daily.     nitroGLYCERIN (NITROSTAT) 0.4 MG SL tablet Place 1 tablet (0.4 mg total) under the tongue every 5 (five) minutes as needed for chest pain. 25 tablet 3   omeprazole (PRILOSEC) 10 MG capsule Take 2 capsules (20 mg total) by mouth daily. 60 capsule 6   umeclidinium-vilanterol (ANORO ELLIPTA) 62.5-25 MCG/ACT AEPB Inhale 1 puff into the lungs daily.     No current facility-administered medications for this visit.    Allergies:   Codeine, Kenalog [triamcinolone acetonide], Relafen [nabumetone], Epinephrine, Penicillins, and Tramadol    ROS:  Please see the history of present illness.   Otherwise, review of systems are positive for none.   All other systems are reviewed and negative.    PHYSICAL EXAM: VS:  BP 120/70 (BP Location: Left Arm, Patient Position: Sitting, Cuff Size: Large)    Pulse 99    Ht 5\' 8"  (1.727 m)    Wt 278 lb (126.1 kg)    BMI 42.27 kg/m  , BMI Body mass index is 42.27 kg/m. GENERAL:  Well appearing NECK:  No jugular venous distention, waveform within normal limits, carotid upstroke brisk and symmetric, no bruits, no thyromegaly LUNGS:  Clear to auscultation bilaterally CHEST:  Unremarkable HEART:  PMI not displaced or sustained,S1 and S2 within normal limits, no S3,  no clicks, no rubs, 2 out of 6 brief apical systolic  murmur nonradiating, no diastolic murmurs, irregular ABD:  Flat, positive bowel sounds normal in frequency in pitch, no bruits, no rebound, no guarding, no midline pulsatile mass, no hepatomegaly, no splenomegaly EXT:  2 plus pulses throughout, no edema, no cyanosis no clubbing   EKG:  EKG is  ordered today. Atrial fibrillation, rate 99, right axis deviation, rate 99, right axis deviation, no acute ST-T wave changes.  Recent Labs: 11/23/2021: TSH 1.711 11/24/2021: B Natriuretic Peptide 609.0 11/28/2021: ALT 24; Magnesium 2.2 12/21/2021: BUN 12; Creatinine, Ser 1.03; Hemoglobin 14.1; Platelets 193; Potassium 5.2; Sodium 145    Lipid Panel    Component Value Date/Time   CHOL 189 03/28/2021 0918   TRIG 196 (H) 03/28/2021 0918   HDL 38 (L) 03/28/2021 0918   CHOLHDL 5.0 (H) 03/28/2021 0918   CHOLHDL 5.1 03/17/2018 0034   VLDL 37 03/17/2018 0034   LDLCALC 116 (H) 03/28/2021 0918      Wt Readings from Last 3 Encounters:  12/27/21 278 lb (126.1 kg)  12/21/21 279 lb 12.8 oz (126.9 kg)  12/17/21 270 lb (122.5 kg)      Other studies Reviewed: Additional studies/ records that were reviewed today include: Hospital records Review of the above records demonstrates:  Please see elsewhere in the note.     ASSESSMENT AND PLAN:  ATRIAL FIB:   She has Ms. MAXI LOUGEE has a CHA2DS2 - VASc score 5.   She is scheduled for cardioversion early next month.  I am going to leave her on the Eliquis and the amiodarone for now.  I will talk about this in the future.  I do think she is going to need to be referred for a Watchman as we are likely to see atrial fibrillation again in the future and she has a GI bleeding history.   (Of note she was scheduled for TEE DC cardioversion but she does not need the TEE.)    CAD:  She did have an elevated troponin but she is not having any ongoing angina.  No change in therapy.  LVH- This was severe on echo.   I will follow this clinically and with repeat  echocardiography in the future.   AS - She understands endocarditis prophylaxis.   She had a stable TAVR.  No change in therapy.  MS: This was moderate on echo and I will follow this clinically.  Dyslipidemia - LDL was 47.  No change in therapy.   Hypertension  Her blood pressure is controlled.  No change in therapy.  Current medicines are reviewed at length with the patient today.  The patient does not have concerns regarding medicines.  The following changes have been made: As above  Labs/ tests ordered today include: None  Orders Placed This Encounter  Procedures   EKG 12-Lead     Disposition:   FU with me after the cardioversion  Signed, Minus Breeding, MD  12/27/2021 1:20 PM    [facility]

## 2021-12-26 NOTE — Progress Notes (Signed)
Cardiology Office Note   Date:  12/27/2021   ID:  Victoria, Delgado 1941-05-29, MRN [id number]  PCP:  Charlane Ferretti, DO  Cardiologist:   Rollene Rotunda, MD   Chief Complaint  Patient presents with   Shortness of Breath      History of Present Illness: Victoria Delgado is a 81 y.o. female who presents for follow up of CAD.  She has atrial fibrillation which she ultimately thinks was related to a steroid injection. This was in 2008. At that time she was found to have asymmetric septal hypertrophy. This has been followed conservatively.  Echocardiogram did demonstrate concentric LV hypertrophy which was moderate and moderate LAE.  In May of 2017 she was in the hospital with CAD.  She was admitted to [facility] on 5/24 after waking up with left arm pain.  She had positive troponin with  PCI to the mid LCX and 1st DX. She is on DAPT with aspirin/Effient.  She had 2 drug-eluting stents placed in the circumflex leading into a marginal. This was apparently somewhat small circumflex. She also had a diagonal had a drug-eluting stent placed.  An echo in our office showed well preserved ejection fraction. There was some moderate aortic stenosis. There was no mention of hypertrophic obstruction although there is LVH.  She was in the hospital in April 2019 with chest pain.    She did have positive cardiac enzymes.   She underwent cardiac catheterization on 03/19/2018, this showed patent stent in the proximal to mid left circumflex artery, 20% ostial left circumflex artery disease, 40% OM 3 disease, widely patent stent in the ostial D1, 20% ostial to proximal LAD disease, 20% mid LAD disease.  Cardiac catheterization did confirm moderate aortic stenosis with mean gradient of 26.7 mmHg.  Medical therapy was recommended for her coronary artery disease.  She had a follow up echo and her AS was severe to critical.  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  (23 mm Edwards Sapien S3 Ultra)  Since I last saw her she was  in the hospital in December with COVID.  I reviewed these records for this visit.  She had atrial fibrillation with rapid ventricular response.  She was treated with IV amiodarone and sent home on oral amiodarone.  She was treated with anticoagulation.  She had a urinary infection and was in the ER for this.  I do note that she had elevated troponin but this was thought to be demand ischemia.  She says she is slowly recovering from COVID.  She is not having any new shortness of breath, PND or orthopnea.  She is not having any cough.  She is not feeling her heart racing or skipping.  She is not having any chest pressure, neck or arm discomfort.  She has been tolerating her anticoagulation despite her previous history of GI bleeding.  Of note she had normal TAVR gradients with moderate mitral stenosis on her echo at the time of her admission in December.   Past Medical History:  Diagnosis Date   Abdominal pain    Arthritis    osteoarthritis-hips,knees, back, hands   Asymmetric septal hypertrophy (HCC)    Atrial fibrillation (HCC)    CAD (coronary artery disease)    2 drug-eluting stents placed in the circumflex leading into a marginal.  May 2017.  Victoria Delgado.   catheterization on 03/19/2018, this showed patent stent in the proximal to mid left circumflex artery, 20% ostial left circumflex artery disease,  40% OM 3 disease, widely patent stent in the ostial D1, 20% ostial to proximal LAD disease, 20% mid LAD disease.   Cataract    corrective surgery done   Fatty liver    Gallstones    GERD (gastroesophageal reflux disease)    GERD (gastroesophageal reflux disease)    Heart palpitations    Hepatitis    hepatitis A; past hx.,many yrs ago ? food source   Hernia, hiatal    Hyperlipidemia    Hypertension    Obesity    Sleep apnea    CPAP    Past Surgical History:  Procedure Laterality Date   APPENDECTOMY  2004   CATARACT EXTRACTION W/ INTRAOCULAR LENS IMPLANT     both eyes   CHOLECYSTECTOMY      LEFT HEART CATH AND CORONARY ANGIOGRAPHY N/A 03/19/2018   Procedure: LEFT HEART CATH AND CORONARY ANGIOGRAPHY;  Surgeon: Burnell Blanks, MD;  Location: [facility];  Service: Cardiovascular;  Laterality: N/A;   NM MYOCAR PERF WALL MOTION  08/15/04   No ischemia   RETINAL DETACHMENT SURGERY Bilateral    remains with slight hazy vision with lights   RIGHT/LEFT HEART CATH AND CORONARY ANGIOGRAPHY N/A 07/11/2020   Procedure: RIGHT/LEFT HEART CATH AND CORONARY ANGIOGRAPHY;  Surgeon: Sherren Mocha, MD;  Location: [facility];  Service: Cardiovascular;  Laterality: N/A;   TOTAL HIP ARTHROPLASTY Right 08/04/2014   Procedure: RIGHT TOTAL HIP ARTHROPLASTY ANTERIOR APPROACH;  Surgeon: Gearlean Alf, MD;  Location: [facility];  Service: Orthopedics;  Laterality: Right;   TOTAL HIP ARTHROPLASTY Left 01/05/2015   Procedure: LEFT TOTAL HIP ARTHROPLASTY ANTERIOR APPROACH;  Surgeon: Gearlean Alf, MD;  Location: [facility];  Service: Orthopedics;  Laterality: Left;   US ECHOCARDIOGRAPHY  06/25/2012   Moderate ASH,LV hyperdynamic,LA is mod. dilated,trace MR,TR,AI     Current Outpatient Medications  Medication Sig Dispense Refill   acetaminophen (TYLENOL) 500 MG tablet Take 1,000 mg by mouth every 6 (six) hours as needed for moderate pain or headache.     albuterol (VENTOLIN HFA) 108 (90 Base) MCG/ACT inhaler Inhale 2 puffs into the lungs every 4 (four) hours as needed for wheezing or shortness of breath.      alum & mag hydroxide-simeth (GELUSIL) [id number] MG chewable tablet Taking 2-4 chewables by mouth daily     amiodarone (PACERONE) 200 MG tablet Take 1 tablet (200 mg total) by mouth daily. 30 tablet 3   apixaban (ELIQUIS) 5 MG TABS tablet Take 1 tablet (5 mg total) by mouth 2 (two) times daily. 60 tablet 5   cholecalciferol (VITAMIN D3) 25 MCG (1000 UNIT) tablet Take 1,000 Units by mouth 2 (two) times daily.     CINNAMON PO Take 1 capsule by mouth in the morning and at bedtime.      CRESTOR 20 MG tablet TAKE 1 TABLET(20 MG) BY MOUTH DAILY (Patient taking differently: Take 20 mg by mouth daily.) 90 tablet 3   ezetimibe (ZETIA) 10 MG tablet Take 10 mg by mouth daily.     furosemide (LASIX) 40 MG tablet Take 1 tablet (40 mg total) by mouth daily. 90 tablet 3   loteprednol (LOTEMAX) 0.5 % ophthalmic suspension Place 1 drop into both eyes daily as needed (imflammation).     metoprolol succinate (TOPROL XL) 50 MG 24 hr tablet Take 1 tablet (50 mg total) by mouth 2 (two) times daily. 60 tablet 0   Multiple Vitamin (MULTIVITAMIN) tablet Take 1 tablet by mouth daily.  Multiple Vitamins-Minerals (ZINC PO) Take 1 tablet by mouth daily.     nitroGLYCERIN (NITROSTAT) 0.4 MG SL tablet Place 1 tablet (0.4 mg total) under the tongue every 5 (five) minutes as needed for chest pain. 25 tablet 3   omeprazole (PRILOSEC) 10 MG capsule Take 2 capsules (20 mg total) by mouth daily. 60 capsule 6   umeclidinium-vilanterol (ANORO ELLIPTA) 62.5-25 MCG/ACT AEPB Inhale 1 puff into the lungs daily.     No current facility-administered medications for this visit.    Allergies:   Codeine, Kenalog [triamcinolone acetonide], Relafen [nabumetone], Epinephrine, Penicillins, and Tramadol    ROS:  Please see the history of present illness.   Otherwise, review of systems are positive for none.   All other systems are reviewed and negative.    PHYSICAL EXAM: VS:  BP 120/70 (BP Location: Left Arm, Patient Position: Sitting, Cuff Size: Large)    Pulse 99    Ht 5\' 8"  (1.727 m)    Wt 278 lb (126.1 kg)    BMI 42.27 kg/m  , BMI Body mass index is 42.27 kg/m. GENERAL:  Well appearing NECK:  No jugular venous distention, waveform within normal limits, carotid upstroke brisk and symmetric, no bruits, no thyromegaly LUNGS:  Clear to auscultation bilaterally CHEST:  Unremarkable HEART:  PMI not displaced or sustained,S1 and S2 within normal limits, no S3,  no clicks, no rubs, 2 out of 6 brief apical systolic  murmur nonradiating, no diastolic murmurs, irregular ABD:  Flat, positive bowel sounds normal in frequency in pitch, no bruits, no rebound, no guarding, no midline pulsatile mass, no hepatomegaly, no splenomegaly EXT:  2 plus pulses throughout, no edema, no cyanosis no clubbing   EKG:  EKG is  ordered today. Atrial fibrillation, rate 99, right axis deviation, rate 99, right axis deviation, no acute ST-T wave changes.  Recent Labs: 11/23/2021: TSH 1.711 11/24/2021: B Natriuretic Peptide 609.0 11/28/2021: ALT 24; Magnesium 2.2 12/21/2021: BUN 12; Creatinine, Ser 1.03; Hemoglobin 14.1; Platelets 193; Potassium 5.2; Sodium 145    Lipid Panel    Component Value Date/Time   CHOL 189 03/28/2021 0918   TRIG 196 (H) 03/28/2021 0918   HDL 38 (L) 03/28/2021 0918   CHOLHDL 5.0 (H) 03/28/2021 0918   CHOLHDL 5.1 03/17/2018 0034   VLDL 37 03/17/2018 0034   LDLCALC 116 (H) 03/28/2021 0918      Wt Readings from Last 3 Encounters:  12/27/21 278 lb (126.1 kg)  12/21/21 279 lb 12.8 oz (126.9 kg)  12/17/21 270 lb (122.5 kg)      Other studies Reviewed: Additional studies/ records that were reviewed today include: Hospital records Review of the above records demonstrates:  Please see elsewhere in the note.     ASSESSMENT AND PLAN:  ATRIAL FIB:   She has Ms. MAXI LOUGEE has a CHA2DS2 - VASc score 5.   She is scheduled for cardioversion early next month.  I am going to leave her on the Eliquis and the amiodarone for now.  I will talk about this in the future.  I do think she is going to need to be referred for a Watchman as we are likely to see atrial fibrillation again in the future and she has a GI bleeding history.   (Of note she was scheduled for TEE DC cardioversion but she does not need the TEE.)    CAD:  She did have an elevated troponin but she is not having any ongoing angina.  No change in therapy.  LVH- This was severe on echo.   I will follow this clinically and with repeat  echocardiography in the future.   AS - She understands endocarditis prophylaxis.   She had a stable TAVR.  No change in therapy.  MS: This was moderate on echo and I will follow this clinically.  Dyslipidemia - LDL was 47.  No change in therapy.   Hypertension  Her blood pressure is controlled.  No change in therapy.  Current medicines are reviewed at length with the patient today.  The patient does not have concerns regarding medicines.  The following changes have been made: As above  Labs/ tests ordered today include: None  Orders Placed This Encounter  Procedures   EKG 12-Lead     Disposition:   FU with me after the cardioversion  Signed, Minus Breeding, MD  12/27/2021 1:20 PM    [facility]

## 2021-12-27 ENCOUNTER — Ambulatory Visit: Payer: Medicare Other | Admitting: Cardiology

## 2021-12-27 ENCOUNTER — Other Ambulatory Visit: Payer: Self-pay

## 2021-12-27 ENCOUNTER — Encounter: Payer: Self-pay | Admitting: Cardiology

## 2021-12-27 VITALS — BP 120/70 | HR 99 | Ht 68.0 in | Wt 278.0 lb

## 2021-12-27 DIAGNOSIS — I517 Cardiomegaly: Secondary | ICD-10-CM

## 2021-12-27 DIAGNOSIS — I480 Paroxysmal atrial fibrillation: Secondary | ICD-10-CM | POA: Diagnosis not present

## 2021-12-27 DIAGNOSIS — R0602 Shortness of breath: Secondary | ICD-10-CM

## 2021-12-27 DIAGNOSIS — E785 Hyperlipidemia, unspecified: Secondary | ICD-10-CM

## 2021-12-27 DIAGNOSIS — I2510 Atherosclerotic heart disease of native coronary artery without angina pectoris: Secondary | ICD-10-CM | POA: Diagnosis not present

## 2021-12-27 DIAGNOSIS — I10 Essential (primary) hypertension: Secondary | ICD-10-CM

## 2021-12-27 NOTE — Patient Instructions (Signed)
Medication Instructions:  Your physician recommends that you continue on your current medications as directed. Please refer to the Current Medication list given to you today.   *If you need a refill on your cardiac medications before your next appointment, please call your pharmacy*  Lab Work: NONE  Testing/Procedures: NONE  Follow-Up: At [facility], you and your health needs are our priority.  As part of our continuing mission to provide you with exceptional heart care, we have created designated Provider Care Teams.  These Care Teams include your primary Cardiologist (physician) and Advanced Practice Providers (APPs -  Physician Assistants and Nurse Practitioners) who all work together to provide you with the care you need, when you need it.  We recommend signing up for the patient portal called "MyChart".  Sign up information is provided on this After Visit Summary.  MyChart is used to connect with patients for Virtual Visits (Telemedicine).  Patients are able to view lab/test results, encounter notes, upcoming appointments, etc.  Non-urgent messages can be sent to your provider as well.   To learn more about what you can do with MyChart, go to [email or web address].    Your next appointment:   1 month(s)  The format for your next appointment:   In Person  Provider:   Rollene Rotunda, MD

## 2022-01-03 ENCOUNTER — Ambulatory Visit (HOSPITAL_COMMUNITY): Payer: Medicare Other | Admitting: Anesthesiology

## 2022-01-03 ENCOUNTER — Encounter (HOSPITAL_COMMUNITY): Payer: Self-pay | Admitting: Cardiovascular Disease

## 2022-01-03 ENCOUNTER — Other Ambulatory Visit (HOSPITAL_COMMUNITY): Payer: Medicare Other

## 2022-01-03 ENCOUNTER — Encounter (HOSPITAL_COMMUNITY)
Admission: RE | Disposition: A | Discharge status: Home / Self Care | Payer: Medicare Other | Source: Ambulatory Visit | Attending: Cardiovascular Disease

## 2022-01-03 ENCOUNTER — Ambulatory Visit (HOSPITAL_COMMUNITY)
Admission: RE | Admit: 2022-01-03 | Discharge: 2022-01-03 | Disposition: A | Discharge status: Home / Self Care | Payer: Medicare Other | Source: Ambulatory Visit | Attending: Cardiovascular Disease | Admitting: Cardiovascular Disease

## 2022-01-03 ENCOUNTER — Other Ambulatory Visit: Payer: Self-pay

## 2022-01-03 DIAGNOSIS — Z955 Presence of coronary angioplasty implant and graft: Secondary | ICD-10-CM | POA: Diagnosis not present

## 2022-01-03 DIAGNOSIS — I2510 Atherosclerotic heart disease of native coronary artery without angina pectoris: Secondary | ICD-10-CM | POA: Diagnosis not present

## 2022-01-03 DIAGNOSIS — K449 Diaphragmatic hernia without obstruction or gangrene: Secondary | ICD-10-CM | POA: Insufficient documentation

## 2022-01-03 DIAGNOSIS — K219 Gastro-esophageal reflux disease without esophagitis: Secondary | ICD-10-CM | POA: Insufficient documentation

## 2022-01-03 DIAGNOSIS — I4891 Unspecified atrial fibrillation: Secondary | ICD-10-CM | POA: Insufficient documentation

## 2022-01-03 DIAGNOSIS — I119 Hypertensive heart disease without heart failure: Secondary | ICD-10-CM | POA: Diagnosis not present

## 2022-01-03 DIAGNOSIS — R778 Other specified abnormalities of plasma proteins: Secondary | ICD-10-CM | POA: Diagnosis not present

## 2022-01-03 DIAGNOSIS — Z6841 Body Mass Index (BMI) 40.0 and over, adult: Secondary | ICD-10-CM | POA: Insufficient documentation

## 2022-01-03 DIAGNOSIS — Z79899 Other long term (current) drug therapy: Secondary | ICD-10-CM | POA: Diagnosis not present

## 2022-01-03 DIAGNOSIS — I050 Rheumatic mitral stenosis: Secondary | ICD-10-CM | POA: Diagnosis not present

## 2022-01-03 DIAGNOSIS — G4730 Sleep apnea, unspecified: Secondary | ICD-10-CM | POA: Insufficient documentation

## 2022-01-03 DIAGNOSIS — Z7901 Long term (current) use of anticoagulants: Secondary | ICD-10-CM | POA: Insufficient documentation

## 2022-01-03 DIAGNOSIS — Z952 Presence of prosthetic heart valve: Secondary | ICD-10-CM | POA: Diagnosis not present

## 2022-01-03 DIAGNOSIS — E785 Hyperlipidemia, unspecified: Secondary | ICD-10-CM | POA: Insufficient documentation

## 2022-01-03 HISTORY — PX: CARDIOVERSION: SHX1299

## 2022-01-03 LAB — POCT I-STAT, CHEM 8
BUN: 15 mg/dL (ref 8–23)
Calcium, Ion: 1.17 mmol/L (ref 1.15–1.40)
Chloride: 105 mmol/L (ref 98–111)
Creatinine, Ser: 0.9 mg/dL (ref 0.44–1.00)
Glucose, Bld: 119 mg/dL — ABNORMAL HIGH (ref 70–99)
HCT: 41 % (ref 36.0–46.0)
Hemoglobin: 13.9 g/dL (ref 12.0–15.0)
Potassium: 4.3 mmol/L (ref 3.5–5.1)
Sodium: 141 mmol/L (ref 135–145)
TCO2: 26 mmol/L (ref 22–32)

## 2022-01-03 SURGERY — CARDIOVERSION
Anesthesia: General

## 2022-01-03 MED ORDER — PROPOFOL 10 MG/ML IV BOLUS
INTRAVENOUS | Status: DC | PRN
  Administered 2022-01-03: 60 mg via INTRAVENOUS

## 2022-01-03 MED ORDER — LIDOCAINE 2% (20 MG/ML) 5 ML SYRINGE
INTRAMUSCULAR | Status: DC | PRN
  Administered 2022-01-03: 60 mg via INTRAVENOUS

## 2022-01-03 MED ORDER — SODIUM CHLORIDE 0.9 % IV SOLN: INTRAVENOUS | Status: DC

## 2022-01-03 NOTE — Anesthesia Postprocedure Evaluation (Signed)
Anesthesia Post Note  Patient: Victoria Delgado  Procedure(s) Performed: CARDIOVERSION     Patient location during evaluation: PACU Anesthesia Type: General Level of consciousness: awake and alert, oriented and patient cooperative Pain management: pain level controlled Vital Signs Assessment: post-procedure vital signs reviewed and stable Respiratory status: spontaneous breathing, nonlabored ventilation and respiratory function stable Cardiovascular status: blood pressure returned to baseline and stable Postop Assessment: no apparent nausea or vomiting Anesthetic complications: no   No notable events documented.  Last Vitals:  Vitals:   01/03/22 1341 01/03/22 1351  BP: (!) 168/86 (!) 130/47  Pulse: (!) 57 (!) 54  Resp: 13 19  Temp:    SpO2: 98% 98%    Last Pain:  Vitals:   01/03/22 1351  TempSrc:   PainSc: 0-No pain                 [name]

## 2022-01-03 NOTE — Interval H&P Note (Signed)
History and Physical Interval Note:  01/03/2022 12:40 PM  Victoria Delgado  has presented today for surgery, with the diagnosis of AFIB.  The various methods of treatment have been discussed with the patient and family. After consideration of risks, benefits and other options for treatment, the patient has consented to  Procedure(s): CARDIOVERSION (N/A) as a surgical intervention.  The patient's history has been reviewed, patient examined, no change in status, stable for surgery.  I have reviewed the patient's chart and labs.  Questions were answered to the patient's satisfaction.    NPO for DCCV. On eliquis >3 weeks. No missed doses.   Gerri Spore T. Flora Lipps, MD, [facility]   [facility]  [address], [address] [place], [place] [address] [phone]  12:41 PM

## 2022-01-03 NOTE — CV Procedure (Signed)
° °  DIRECT CURRENT CARDIOVERSION  NAME:  Victoria Delgado    MRN: [id number] DOB:  01-03-1941    ADMIT DATE: 01/03/2022  Indication:  Symptomatic atrial fibrillation  Procedure Note:  The patient signed informed consent.  They have had had therapeutic anticoagulation with eliquis greater than 3 weeks.  Anesthesia was administered by Dr. Salvadore Farber.  Adequate airway was maintained throughout and vital followed per protocol.  They were cardioverted x 1 with 200J of biphasic synchronized energy.  They converted to NSR.  There were no apparent complications.  The patient had normal neuro status and respiratory status post procedure with vitals stable as recorded elsewhere.    Follow up: They will continue on current medical therapy and follow up with cardiology as scheduled.  Gerri Spore T. Flora Lipps, MD, [facility]   [facility]  [address], [address] [place], [place] [address] [phone]  1:27 PM

## 2022-01-03 NOTE — Anesthesia Preprocedure Evaluation (Addendum)
Anesthesia Evaluation  Patient identified by MRN, date of birth, ID band Patient awake    Reviewed: Allergy & Precautions, NPO status , Patient's Chart, lab work & pertinent test results  Airway Mallampati: III  TM Distance: >3 FB Neck ROM: Full    Dental  (+) Poor Dentition, Chipped, Dental Advisory Given,    Pulmonary sleep apnea and Continuous Positive Airway Pressure Ventilation , former smoker,    Pulmonary exam normal breath sounds clear to auscultation       Cardiovascular hypertension, Pt. on medications + CAD and + Cardiac Stents (2017)  Normal cardiovascular exam+ dysrhythmias Atrial Fibrillation + Valvular Problems/Murmurs (mod MS. s/p TAVR)  Rhythm:Irregular Rate:Normal  Last echo 2021: 1. Left ventricular ejection fraction, by estimation, is 65 to 70%. The  left ventricle has normal function. The left ventricle has no regional  wall motion abnormalities. There is moderate concentric left ventricular  hypertrophy. Left ventricular  diastolic function could not be evaluated.  2. Right ventricular systolic function is normal. The right ventricular  size is normal. Tricuspid regurgitation signal is inadequate for assessing  PA pressure.  3. Left atrial size was severely dilated.  4. No evidence of mitral valve regurgitation. Moderate mitral stenosis.  The mean mitral valve gradient is 10.0 mmHg. Severe mitral annular  calcification.  5. The aortic valve has been repaired/replaced. There is a 23 mm Sapien  prosthetic (TAVR) valve present in the aortic position. Echo findings are  consistent with normal structure and function of the aortic valve  prosthesis. Aortic valve mean gradient  measures 12.7 mmHg. Aortic valve Vmax measures 2.50 m/s.  6. The inferior vena cava is dilated in size with >50% respiratory  variability, suggesting right atrial pressure of 8 mmHg.    Neuro/Psych negative neurological ROS   negative psych ROS   GI/Hepatic hiatal hernia, GERD  Medicated and Controlled,(+) Hepatitis -  Endo/Other  Morbid obesityBMI 42  Renal/GU Renal InsufficiencyRenal disease  negative genitourinary   Musculoskeletal negative musculoskeletal ROS (+)   Abdominal (+) + obese,   Peds negative pediatric ROS (+)  Hematology negative hematology ROS (+)   Anesthesia Other Findings   Reproductive/Obstetrics negative OB ROS                            Anesthesia Physical Anesthesia Plan  ASA: 3  Anesthesia Plan: General   Post-op Pain Management:    Induction: Intravenous  PONV Risk Score and Plan: TIVA and Treatment may vary due to age or medical condition  Airway Management Planned: Natural Airway and Mask  Additional Equipment: None  Intra-op Plan:   Post-operative Plan:   Informed Consent: I have reviewed the patients History and Physical, chart, labs and discussed the procedure including the risks, benefits and alternatives for the proposed anesthesia with the patient or authorized representative who has indicated his/her understanding and acceptance.       Plan Discussed with: CRNA  Anesthesia Plan Comments:        Anesthesia Quick Evaluation

## 2022-01-03 NOTE — Discharge Instructions (Signed)

## 2022-01-03 NOTE — Transfer of Care (Signed)
Immediate Anesthesia Transfer of Care Note  Patient: Victoria Delgado  Procedure(s) Performed: CARDIOVERSION  Patient Location: Endoscopy Unit  Anesthesia Type:General  Level of Consciousness: drowsy  Airway & Oxygen Therapy: Patient Spontanous Breathing  Post-op Assessment: Report given to RN and Post -op Vital signs reviewed and stable  Post vital signs: Reviewed and stable  Last Vitals:  Vitals Value Taken Time  BP    Temp    Pulse    Resp    SpO2      Last Pain:  Vitals:   01/03/22 1226  TempSrc: Temporal  PainSc: 0-No pain         Complications: No notable events documented.

## 2022-01-05 ENCOUNTER — Encounter (HOSPITAL_COMMUNITY): Payer: Self-pay | Admitting: Cardiovascular Disease

## 2022-01-08 LAB — POCT I-STAT, CHEM 8
BUN: 23 mg/dL (ref 8–23)
Calcium, Ion: 1.02 mmol/L — ABNORMAL LOW (ref 1.15–1.40)
Chloride: 106 mmol/L (ref 98–111)
Creatinine, Ser: 1 mg/dL (ref 0.44–1.00)
Glucose, Bld: 121 mg/dL — ABNORMAL HIGH (ref 70–99)
HCT: 41 % (ref 36.0–46.0)
Hemoglobin: 13.9 g/dL (ref 12.0–15.0)
Potassium: 7.5 mmol/L (ref 3.5–5.1)
Sodium: 137 mmol/L (ref 135–145)
TCO2: 28 mmol/L (ref 22–32)

## 2022-01-10 ENCOUNTER — Encounter (HOSPITAL_COMMUNITY): Payer: Self-pay | Admitting: Physician Assistant

## 2022-01-10 ENCOUNTER — Ambulatory Visit (HOSPITAL_COMMUNITY)
Admission: RE | Admit: 2022-01-10 | Discharge: 2022-01-10 | Disposition: A | Discharge status: Home / Self Care | Payer: Medicare Other | Source: Ambulatory Visit | Attending: Physician Assistant | Admitting: Physician Assistant

## 2022-01-10 ENCOUNTER — Other Ambulatory Visit: Payer: Self-pay

## 2022-01-10 VITALS — BP 136/76 | HR 54 | Ht 68.0 in | Wt 278.8 lb

## 2022-01-10 DIAGNOSIS — I38 Endocarditis, valve unspecified: Secondary | ICD-10-CM | POA: Insufficient documentation

## 2022-01-10 DIAGNOSIS — G35 Multiple sclerosis: Secondary | ICD-10-CM | POA: Diagnosis not present

## 2022-01-10 DIAGNOSIS — I2510 Atherosclerotic heart disease of native coronary artery without angina pectoris: Secondary | ICD-10-CM | POA: Insufficient documentation

## 2022-01-10 DIAGNOSIS — D6869 Other thrombophilia: Secondary | ICD-10-CM | POA: Diagnosis not present

## 2022-01-10 DIAGNOSIS — Z6841 Body Mass Index (BMI) 40.0 and over, adult: Secondary | ICD-10-CM | POA: Insufficient documentation

## 2022-01-10 DIAGNOSIS — Z79899 Other long term (current) drug therapy: Secondary | ICD-10-CM | POA: Insufficient documentation

## 2022-01-10 DIAGNOSIS — E669 Obesity, unspecified: Secondary | ICD-10-CM | POA: Insufficient documentation

## 2022-01-10 DIAGNOSIS — Z7901 Long term (current) use of anticoagulants: Secondary | ICD-10-CM | POA: Insufficient documentation

## 2022-01-10 DIAGNOSIS — Z8616 Personal history of COVID-19: Secondary | ICD-10-CM | POA: Insufficient documentation

## 2022-01-10 DIAGNOSIS — I4819 Other persistent atrial fibrillation: Secondary | ICD-10-CM

## 2022-01-10 DIAGNOSIS — E785 Hyperlipidemia, unspecified: Secondary | ICD-10-CM | POA: Insufficient documentation

## 2022-01-10 DIAGNOSIS — G4733 Obstructive sleep apnea (adult) (pediatric): Secondary | ICD-10-CM | POA: Diagnosis not present

## 2022-01-10 DIAGNOSIS — K922 Gastrointestinal hemorrhage, unspecified: Secondary | ICD-10-CM | POA: Insufficient documentation

## 2022-01-10 DIAGNOSIS — Z09 Encounter for follow-up examination after completed treatment for conditions other than malignant neoplasm: Secondary | ICD-10-CM | POA: Diagnosis not present

## 2022-01-10 DIAGNOSIS — I10 Essential (primary) hypertension: Secondary | ICD-10-CM | POA: Diagnosis not present

## 2022-01-10 NOTE — Progress Notes (Signed)
Primary Care Physician: Charlane Ferretti, DO Primary Cardiologist: Dr Antoine Poche  Primary Electrophysiologist: none Referring Physician: Dr Bennie Hind Victoria Delgado is a 81 y.o. 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 Presented to ER 11/23/21 with chest pain for 1 hour, she thought her atrial fib was starting back up. HR was 140 by EMS. In ER given Cardizem bout with drop in BP to 70s systolic and then given fluid bolus.  Also noted she had been sick for 3-4 days prior to presentation.  Cough, congestion and fever and found to be COVID +. Once HR was slowed pt's chest pain resolved. She was also acutely fluid overloaded and started on diuresis. Patient is on Eliquis for a CHADS2VASC score of 5. She was started on amiodarone with plan for outpatient DCCV.   On follow up today, patient is s/p DCCV on 01/03/22. She reports that she feels much better in SR. Her SOB has greatly improved. She denies any current bleeding issues on anticoagulation.   Today, she denies symptoms of palpitations, chest pain, orthopnea, PND, lower extremity edema, dizziness, presyncope, syncope, snoring, daytime somnolence, bleeding, or neurologic sequela. The patient is tolerating medications without difficulties and is otherwise without complaint today.    Atrial Fibrillation Risk Factors:  she does have symptoms or diagnosis of sleep apnea. she is compliant with CPAP therapy. she does not have a history of rheumatic fever.   she has a BMI of Body mass index is 42.39 kg/m.Marland Kitchen Filed Weights   01/10/22 1318  Weight: 126.5 kg     Family History  Problem Relation Age of Onset   Hypertension Mother    CVA Mother    Lung cancer Father 51       asbestos exposure   Cancer Father    Esophageal cancer Other        nephew   Diabetes Other        nephew    Colon cancer Neg Hx    Pancreatic cancer Neg Hx    Kidney disease Neg Hx    Liver disease Neg Hx      Atrial Fibrillation Management history:  Previous antiarrhythmic drugs: amiodarone  Previous cardioversions: 01/03/22 Previous ablations: none CHADS2VASC score: 5 Anticoagulation history: Eliquis   Past Medical History:  Diagnosis Date   Abdominal pain    Arthritis    osteoarthritis-hips,knees, back, hands   Asymmetric septal hypertrophy (HCC)    Atrial fibrillation (HCC)    CAD (coronary artery disease)    2 drug-eluting stents placed in the circumflex leading into a marginal.  May 2017.  [name].   catheterization on 03/19/2018, this showed patent stent in the proximal to mid left circumflex artery, 20% ostial left circumflex artery disease, 40% OM 3 disease, widely patent stent in the ostial D1, 20% ostial to proximal LAD disease, 20% mid LAD disease.   Cataract    corrective surgery done   Fatty liver    Gallstones    GERD (gastroesophageal reflux disease)    GERD (gastroesophageal reflux disease)    Heart palpitations    Hepatitis    hepatitis A; past hx.,many yrs ago ? food source   Hernia, hiatal    Hyperlipidemia    Hypertension    Obesity    Sleep apnea    CPAP   Past  Surgical History:  Procedure Laterality Date   APPENDECTOMY  2004   CARDIOVERSION N/A 01/03/2022   Procedure: CARDIOVERSION;  Surgeon: Geralynn Rile, MD;  Location: [facility];  Service: Cardiovascular;  Laterality: N/A;   CATARACT EXTRACTION W/ INTRAOCULAR LENS IMPLANT     both eyes   CHOLECYSTECTOMY     LEFT HEART CATH AND CORONARY ANGIOGRAPHY N/A 03/19/2018   Procedure: LEFT HEART CATH AND CORONARY ANGIOGRAPHY;  Surgeon: Burnell Blanks, MD;  Location: [facility];  Service: Cardiovascular;  Laterality: N/A;   NM MYOCAR PERF WALL MOTION  08/15/04   No ischemia   RETINAL DETACHMENT SURGERY Bilateral    remains with slight hazy vision with lights   RIGHT/LEFT HEART  CATH AND CORONARY ANGIOGRAPHY N/A 07/11/2020   Procedure: RIGHT/LEFT HEART CATH AND CORONARY ANGIOGRAPHY;  Surgeon: Sherren Mocha, MD;  Location: [place] [facility];  Service: Cardiovascular;  Laterality: N/A;   TOTAL HIP ARTHROPLASTY Right 08/04/2014   Procedure: RIGHT TOTAL HIP ARTHROPLASTY ANTERIOR APPROACH;  Surgeon: Gearlean Alf, MD;  Location: [facility];  Service: Orthopedics;  Laterality: Right;   TOTAL HIP ARTHROPLASTY Left 01/05/2015   Procedure: LEFT TOTAL HIP ARTHROPLASTY ANTERIOR APPROACH;  Surgeon: Gearlean Alf, MD;  Location: [facility];  Service: Orthopedics;  Laterality: Left;   US ECHOCARDIOGRAPHY  06/25/2012   Moderate ASH,LV hyperdynamic,LA is mod. dilated,trace MR,TR,AI    Current Outpatient Medications  Medication Sig Dispense Refill   acetaminophen (TYLENOL) 500 MG tablet Take 1,000 mg by mouth every 6 (six) hours as needed for moderate pain or headache.     albuterol (VENTOLIN HFA) 108 (90 Base) MCG/ACT inhaler Inhale 2 puffs into the lungs every 4 (four) hours as needed for wheezing or shortness of breath.      alum & mag hydroxide-simeth (GELUSIL) [id number] MG chewable tablet Taking 2-4 chewables by mouth daily     amiodarone (PACERONE) 200 MG tablet Take 1 tablet (200 mg total) by mouth daily. 30 tablet 3   apixaban (ELIQUIS) 5 MG TABS tablet Take 1 tablet (5 mg total) by mouth 2 (two) times daily. 60 tablet 5   cholecalciferol (VITAMIN D3) 25 MCG (1000 UNIT) tablet Take 1,000 Units by mouth 2 (two) times daily.     Coenzyme Q10 (COQ10) 100 MG CAPS Take 100 mg by mouth daily.     CRESTOR 20 MG tablet TAKE 1 TABLET(20 MG) BY MOUTH DAILY 90 tablet 3   ezetimibe (ZETIA) 10 MG tablet Take 10 mg by mouth daily.     furosemide (LASIX) 40 MG tablet Take 1 tablet (40 mg total) by mouth daily. 90 tablet 3   loteprednol (LOTEMAX) 0.5 % ophthalmic suspension Place 1 drop into both eyes daily as needed (imflammation).     metoprolol succinate (TOPROL XL) 50 MG 24 hr tablet Take 1  tablet (50 mg total) by mouth 2 (two) times daily. 60 tablet 0   Multiple Vitamin (MULTIVITAMIN) tablet Take 1 tablet by mouth 2 (two) times a week.     nitroGLYCERIN (NITROSTAT) 0.4 MG SL tablet Place 1 tablet (0.4 mg total) under the tongue every 5 (five) minutes as needed for chest pain. 25 tablet 3   omeprazole (PRILOSEC) 10 MG capsule Take 2 capsules (20 mg total) by mouth daily. 60 capsule 6   Probiotic Product (PROBIOTIC DAILY PO) Take 1 capsule by mouth daily.     Propylene Glycol (SYSTANE BALANCE) 0.6 % SOLN Place 1 drop into both eyes 2 (two) times daily as  needed (dry eyes).     No current facility-administered medications for this encounter.    Allergies  Allergen Reactions   Codeine Nausea Only   Kenalog [Triamcinolone Acetonide] Other (See Comments)    Had A.fib after she received the injection."Steroid meds"   Kenalog [Triamcinolone] Rash    Other reaction(s): AFIB   Pantoprazole Rash    Other reaction(s): chronic upper GI pain all the way to back   Relafen [Nabumetone] Other (See Comments)    Edema    Epinephrine Palpitations   Penicillins Rash    40 years ago, injection site was red and swollen.  rash, facial/tongue/throat swelling, SOB or lightheadedness with hypotension: Yes Has patient had a PCN reaction causing immediate  Has patient had a PCN reaction causing severe rash involving mucus membranes or skin necrosis: NO Has patient had a PCN reaction that required hospitalization. No  Has patient had a PCN reaction occurring within the last 10 years: No If all of the above answers are "NO", then may proceed with Cephalosporin use.     Tramadol Other (See Comments)    Auditory halllucinations    Social History   Socioeconomic History   Marital status: Married    Spouse name: Not on file   Number of children: 2   Years of education: Not on file   Highest education level: Not on file  Occupational History    Employer: OTHER  Tobacco Use   Smoking status:  Former   Smokeless tobacco: Never   Tobacco comments:    QUIT IN 1990  Vaping Use   Vaping Use: Never used  Substance and Sexual Activity   Alcohol use: No    Alcohol/week: 0.0 standard drinks   Drug use: No   Sexual activity: Yes  Other Topics Concern   Not on file  Social History Narrative   Two grandchildren.            Social Determinants of Health   Financial Resource Strain: Not on file  Food Insecurity: Not on file  Transportation Needs: Not on file  Physical Activity: Not on file  Stress: Not on file  Social Connections: Not on file  Intimate Partner Violence: Not on file     ROS- All systems are reviewed and negative except as per the HPI above.  Physical Exam: Vitals:   01/10/22 1318  BP: 136/76  Pulse: (!) 54  Weight: 126.5 kg  Height: 5\' 8"  (1.727 m)    GEN- The patient is a well appearing obese elderly female, alert and oriented x 3 today.   HEENT-head normocephalic, atraumatic, sclera clear, conjunctiva pink, hearing intact, trachea midline. Lungs- Clear to ausculation bilaterally, normal work of breathing Heart- Regular rate and rhythm, no rubs or gallops, 2/6 systolic murmur GI- soft, NT, ND, + BS Extremities- no clubbing, cyanosis, or edema MS- no significant deformity or atrophy Skin- no rash or lesion Psych- euthymic mood, full affect Neuro- strength and sensation are intact   Wt Readings from Last 3 Encounters:  01/10/22 126.5 kg  01/03/22 123.8 kg  12/27/21 126.1 kg    EKG today demonstrates  SB, LAFB Vent. rate 54 BPM PR interval 184 ms QRS duration 94 ms QT/QTcB 484/458 ms  Echo 11/24/21 demonstrated   1. Left ventricular ejection fraction, by estimation, is 65 to 70%. The  left ventricle has normal function. The left ventricle has no regional  wall motion abnormalities. There is moderate concentric left ventricular  hypertrophy. Left ventricular diastolic function could  not be evaluated.   2. Right ventricular systolic  function is normal. The right ventricular  size is normal. Tricuspid regurgitation signal is inadequate for assessing PA pressure.   3. Left atrial size was severely dilated.   4. No evidence of mitral valve regurgitation. Moderate mitral stenosis.  The mean mitral valve gradient is 10.0 mmHg. Severe mitral annular  calcification.   5. The aortic valve has been repaired/replaced. There is a 23 mm Sapien prosthetic (TAVR) valve present in the aortic position. Echo findings are consistent with normal structure and function of the aortic valve prosthesis. Aortic valve mean gradient  measures 12.7 mmHg. Aortic valve Vmax measures 2.50 m/s.   6. The inferior vena cava is dilated in size with >50% respiratory  variability, suggesting right atrial pressure of 8 mmHg.   Comparison(s): Mitral stenosis gradients are higher, mostly due to atrial fibrillation with rapid ventricular rate. There is a new TAVR that appears to have normal function, baseline gradients are not available for comparison in our system, but are lower  than reported in 11/03/2021 echo from [place].  Epic records are reviewed at length today  CHA2DS2-VASc Score = 5  The patient's score is based upon: CHF History: 0 HTN History: 1 Diabetes History: 0 Stroke History: 0 Vascular Disease History: 1 Age Score: 2 Gender Score: 1       ASSESSMENT AND PLAN: 1. Persistent Atrial Fibrillation (ICD10:  I48.19) The patient's CHA2DS2-VASc score is 5, indicating a 7.2% annual risk of stroke.   S/p DCCV on 01/03/22 Patient back in SR. Continue amiodarone 200 mg daily Continue Toprol 50 mg BID Continue Eliquis 5 mg BID per Dr Percival Spanish.   2. Secondary Hypercoagulable State (ICD10:  D68.69) The patient is at significant risk for stroke/thromboembolism based upon her CHA2DS2-VASc Score of 5.  Continue Apixaban (Eliquis). With her h/o GI bleeding on anticoagulation, she may be a good candidate for Watchman procedure. However, her h/o HCM places  her at high risk for stroke off anticoagulation. Also, her moderate MS this may be a disincentive for Watchman as well. We discussed referral to [facility] team and she would like to defer for now.  3. Obesity Body mass index is 42.39 kg/m. Lifestyle modification was discussed and encouraged including regular physical activity and weight reduction.  4. Obstructive sleep apnea Patient reports compliance with CPAP therapy.  5. CAD No anginal symptoms.  6. HCM Continue BB as above.  7. Valvular heart disease Severe AS s/p TAVR Moderate MS   Follow up with Dr Percival Spanish as scheduled.    [facility] [address] [place], [place] [address] [phone] 01/10/2022 1:24 PM

## 2022-01-22 ENCOUNTER — Other Ambulatory Visit: Payer: Self-pay | Admitting: Cardiology

## 2022-01-23 ENCOUNTER — Other Ambulatory Visit: Payer: Self-pay | Admitting: Cardiology

## 2022-02-04 DIAGNOSIS — I38 Endocarditis, valve unspecified: Secondary | ICD-10-CM | POA: Insufficient documentation

## 2022-02-04 NOTE — Progress Notes (Signed)
Cardiology Office Note   Date:  02/05/2022   ID:  Victoria Delgado, DOB [birth year], MRN [id number]  PCP:  Charlane Ferretti, DO  Cardiologist:   Rollene Rotunda, MD   Chief Complaint  Patient presents with   Atrial Fibrillation      History of Present Illness: Victoria Delgado is a 81 y.o. female who presents for follow up of CAD.  She has atrial fibrillation which she ultimately thinks was related to a steroid injection. This was in 2008. At that time she was found to have asymmetric septal hypertrophy. This has been followed conservatively.  Echocardiogram did demonstrate concentric LV hypertrophy which was moderate and moderate LAE.  In May of 2017 she was in the hospital with CAD.  She was admitted to [facility] on 5/24 after waking up with left arm pain.  She had positive troponin with  PCI to the mid LCX and 1st DX. She is on DAPT with aspirin/Effient.  She had 2 drug-eluting stents placed in the circumflex leading into a marginal. This was apparently somewhat small circumflex. She also had a diagonal had a drug-eluting stent placed.  An echo in our office showed well preserved ejection fraction. There was some moderate aortic stenosis. There was no mention of hypertrophic obstruction although there is LVH.  She was in the hospital in April 2019 with chest pain.    She did have positive cardiac enzymes.   She underwent cardiac catheterization on 03/19/2018, this showed patent stent in the proximal to mid left circumflex artery, 20% ostial left circumflex artery disease, 40% OM 3 disease, widely patent stent in the ostial D1, 20% ostial to proximal LAD disease, 20% mid LAD disease.  Cardiac catheterization did confirm moderate aortic stenosis with mean gradient of 26.7 mmHg.  Medical therapy was recommended for her coronary artery disease.  She had a follow up echo and her AS was severe to critical.  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  (23 mm Edwards Sapien S3 Ultra)  She was in the hospital and  December with COVID.  She had atrial fibrillation with rapid ventricular response.  She was treated with IV amiodarone and sent home on oral amiodarone.  She was treated with anticoagulation.   Of note she had normal TAVR gradients with moderate mitral stenosis on her echo at the time of her admission in December. She had a urinary infection and was in the ER for this.  I do note that she had elevated troponin but this was thought to be demand ischemia.     Since I last saw her she had cardioversion on February 1.  She was seen in the atrial fibrillation clinic.  She has done well since I saw her.  She has not yet exercising as much as I would like.  She is been limited by back pain. The patient denies any new symptoms such as chest discomfort, neck or arm discomfort. There has been no new shortness of breath, PND or orthopnea. There have been no reported palpitations, presyncope or syncope.    Past Medical History:  Diagnosis Date   Abdominal pain    Arthritis    osteoarthritis-hips,knees, back, hands   Asymmetric septal hypertrophy (HCC)    Atrial fibrillation (HCC)    CAD (coronary artery disease)    2 drug-eluting stents placed in the circumflex leading into a marginal.  May 2017.  Victoria Delgado.   catheterization on 03/19/2018, this showed patent stent in the proximal to mid  left circumflex artery, 20% ostial left circumflex artery disease, 40% OM 3 disease, widely patent stent in the ostial D1, 20% ostial to proximal LAD disease, 20% mid LAD disease.   Cataract    corrective surgery done   Fatty liver    Gallstones    GERD (gastroesophageal reflux disease)    Hepatitis    hepatitis A; past hx.,many yrs ago ? food source   Hernia, hiatal    Hyperlipidemia    Hypertension    Obesity    Sleep apnea    CPAP    Past Surgical History:  Procedure Laterality Date   APPENDECTOMY  2004   CARDIOVERSION N/A 01/03/2022   Procedure: CARDIOVERSION;  Surgeon: Sande Rives, MD;  Location:  [facility] ENDOSCOPY;  Service: Cardiovascular;  Laterality: N/A;   CATARACT EXTRACTION W/ INTRAOCULAR LENS IMPLANT     both eyes   CHOLECYSTECTOMY     LEFT HEART CATH AND CORONARY ANGIOGRAPHY N/A 03/19/2018   Procedure: LEFT HEART CATH AND CORONARY ANGIOGRAPHY;  Surgeon: Kathleene Hazel, MD;  Location: [facility] INVASIVE CV LAB;  Service: Cardiovascular;  Laterality: N/A;   NM MYOCAR PERF WALL MOTION  08/15/04   No ischemia   RETINAL DETACHMENT SURGERY Bilateral    remains with slight hazy vision with lights   RIGHT/LEFT HEART CATH AND CORONARY ANGIOGRAPHY N/A 07/11/2020   Procedure: RIGHT/LEFT HEART CATH AND CORONARY ANGIOGRAPHY;  Surgeon: Tonny Bollman, MD;  Location: [facility] INVASIVE CV LAB;  Service: Cardiovascular;  Laterality: N/A;   TOTAL HIP ARTHROPLASTY Right 08/04/2014   Procedure: RIGHT TOTAL HIP ARTHROPLASTY ANTERIOR APPROACH;  Surgeon: Loanne Drilling, MD;  Location: [facility] ORS;  Service: Orthopedics;  Laterality: Right;   TOTAL HIP ARTHROPLASTY Left 01/05/2015   Procedure: LEFT TOTAL HIP ARTHROPLASTY ANTERIOR APPROACH;  Surgeon: Loanne Drilling, MD;  Location: [facility] ORS;  Service: Orthopedics;  Laterality: Left;   US ECHOCARDIOGRAPHY  06/25/2012   Moderate ASH,LV hyperdynamic,LA is mod. dilated,trace MR,TR,AI     Current Outpatient Medications  Medication Sig Dispense Refill   acetaminophen (TYLENOL) 500 MG tablet Take 1,000 mg by mouth every 6 (six) hours as needed for moderate pain or headache.     albuterol (VENTOLIN HFA) 108 (90 Base) MCG/ACT inhaler Inhale 2 puffs into the lungs every 4 (four) hours as needed for wheezing or shortness of breath.      alum & mag hydroxide-simeth (GELUSIL) 200-200-25 MG chewable tablet Taking 2-4 chewables by mouth daily     apixaban (ELIQUIS) 5 MG TABS tablet Take 1 tablet (5 mg total) by mouth 2 (two) times daily. 60 tablet 5   cholecalciferol (VITAMIN D3) 25 MCG (1000 UNIT) tablet Take 1,000 Units by mouth 2 (two) times daily.     Coenzyme Q10 (COQ10) 100 MG  CAPS Take 100 mg by mouth daily.     CRESTOR 20 MG tablet TAKE 1 TABLET(20 MG) BY MOUTH DAILY 90 tablet 3   ezetimibe (ZETIA) 10 MG tablet Take 10 mg by mouth daily.     furosemide (LASIX) 40 MG tablet Take 1 tablet (40 mg total) by mouth daily. 90 tablet 3   loteprednol (LOTEMAX) 0.5 % ophthalmic suspension Place 1 drop into both eyes daily as needed (imflammation).     Multiple Vitamin (MULTIVITAMIN) tablet Take 1 tablet by mouth 2 (two) times a week.     nitroGLYCERIN (NITROSTAT) 0.4 MG SL tablet Place 1 tablet (0.4 mg total) under the tongue every 5 (five) minutes as needed for chest pain. 25 tablet  3   omeprazole (PRILOSEC) 10 MG capsule Take 2 capsules (20 mg total) by mouth daily. 60 capsule 6   Probiotic Product (PROBIOTIC DAILY PO) Take 1 capsule by mouth daily.     Propylene Glycol (SYSTANE BALANCE) 0.6 % SOLN Place 1 drop into both eyes 2 (two) times daily as needed (dry eyes).     TOPROL XL 50 MG 24 hr tablet TAKE 1 TABLET(50 MG) BY MOUTH TWICE DAILY 60 tablet 0   amiodarone (PACERONE) 100 MG tablet Take 1 tablet (100 mg total) by mouth daily. 90 tablet 3   No current facility-administered medications for this visit.    Allergies:   Codeine, Kenalog [triamcinolone acetonide], Kenalog [triamcinolone], Pantoprazole, Relafen [nabumetone], Epinephrine, Penicillins, and Tramadol    ROS:  Please see the history of present illness.   Otherwise, review of systems are positive for none.   All other systems are reviewed and negative.    PHYSICAL EXAM: VS:  BP 140/72    Pulse 85    Ht 5\' 8"  (1.727 m)    Wt 281 lb (127.5 kg)    SpO2 97%    BMI 42.73 kg/m  , BMI Body mass index is 42.73 kg/m. GENERAL:  Well appearing NECK:  No jugular venous distention, waveform within normal limits, carotid upstroke brisk and symmetric, no bruits, no thyromegaly LUNGS:  Clear to auscultation bilaterally CHEST:  Unremarkable HEART:  PMI not displaced or sustained,S1 and S2 within normal limits, no S3, no  S4, no clicks, no rubs, 2/6  murmurs ABD:  Flat, positive bowel sounds normal in frequency in pitch, no bruits, no rebound, no guarding, no midline pulsatile mass, no hepatomegaly, no splenomegaly EXT:  2 plus pulses throughout, no edema, no cyanosis no clubbing    EKG:  EKG is not ordered today.   Recent Labs: 11/23/2021: TSH 1.711 11/24/2021: B Natriuretic Peptide 609.0 11/28/2021: ALT 24; Magnesium 2.2 12/21/2021: Platelets 193 01/03/2022: BUN 15; Creatinine, Ser 0.90; Hemoglobin 13.9; Potassium 4.3; Sodium 141    Lipid Panel    Component Value Date/Time   CHOL 189 03/28/2021 0918   TRIG 196 (H) 03/28/2021 0918   HDL 38 (L) 03/28/2021 0918   CHOLHDL 5.0 (H) 03/28/2021 0918   CHOLHDL 5.1 03/17/2018 0034   VLDL 37 03/17/2018 0034   LDLCALC 116 (H) 03/28/2021 0918      Wt Readings from Last 3 Encounters:  02/05/22 281 lb (127.5 kg)  01/10/22 278 lb 12.8 oz (126.5 kg)  01/03/22 273 lb (123.8 kg)      Other studies Reviewed: Additional studies/ records that were reviewed today include: Hospital records Review of the above records demonstrates:  Please see elsewhere in the note.     ASSESSMENT AND PLAN:  ATRIAL FIB:   She has Ms. KALAN RINN has a CHA2DS2 - VASc score 5.   She would like to come off the amiodarone.  I am going to reduce it to 100 mg daily and maybe discontinue it in about 3 months if she has had no recurrent fibrillation.  Of asked her to start wearing her husband's Apple Watch.  Of note she is up-to-date with labs except for TSH and I will check this.  CAD:  The patient has no new sypmtoms.  No further cardiovascular testing is indicated.  We will continue with aggressive risk reduction and meds as listed.  LVH- This was moderate in echo in December.  No change in therapy.   AS - She understands endocarditis prophylaxis.  She had a stable TAVR in December.  MS: This was moderate on echo and I will follow this clinically.  Dyslipidemia - LDL  was 47.  No change in therapy.   Hypertension  The blood pressure is at target. No change in medications is indicated. We will continue with therapeutic lifestyle changes (TLC).  Hyperkalemia: She was slightly high on her last reading and I will check a basic metabolic profile.  Current medicines are reviewed at length with the patient today.  The patient does not have concerns regarding medicines.  The following changes have been made: As above  Labs/ tests ordered today include: None  Orders Placed This Encounter  Procedures   Basic metabolic panel   TSH     Disposition:   FU with me in 3 months.   Signed, Rollene Rotunda, MD  02/05/2022 9:03 AM    [facility]

## 2022-02-05 ENCOUNTER — Other Ambulatory Visit: Payer: Self-pay

## 2022-02-05 ENCOUNTER — Encounter: Payer: Self-pay | Admitting: Cardiology

## 2022-02-05 ENCOUNTER — Ambulatory Visit: Payer: Medicare Other | Admitting: Cardiology

## 2022-02-05 VITALS — BP 140/72 | HR 85 | Ht 68.0 in | Wt 281.0 lb

## 2022-02-05 DIAGNOSIS — I480 Paroxysmal atrial fibrillation: Secondary | ICD-10-CM

## 2022-02-05 DIAGNOSIS — I2510 Atherosclerotic heart disease of native coronary artery without angina pectoris: Secondary | ICD-10-CM

## 2022-02-05 DIAGNOSIS — I38 Endocarditis, valve unspecified: Secondary | ICD-10-CM

## 2022-02-05 DIAGNOSIS — I421 Obstructive hypertrophic cardiomyopathy: Secondary | ICD-10-CM

## 2022-02-05 DIAGNOSIS — G4733 Obstructive sleep apnea (adult) (pediatric): Secondary | ICD-10-CM | POA: Diagnosis not present

## 2022-02-05 DIAGNOSIS — E875 Hyperkalemia: Secondary | ICD-10-CM

## 2022-02-05 DIAGNOSIS — D6869 Other thrombophilia: Secondary | ICD-10-CM | POA: Diagnosis not present

## 2022-02-05 DIAGNOSIS — I509 Heart failure, unspecified: Secondary | ICD-10-CM

## 2022-02-05 LAB — BASIC METABOLIC PANEL
BUN/Creatinine Ratio: 17 (ref 12–28)
BUN: 17 mg/dL (ref 8–27)
CO2: 27 mmol/L (ref 20–29)
Calcium: 9.6 mg/dL (ref 8.7–10.3)
Chloride: 101 mmol/L (ref 96–106)
Creatinine, Ser: 0.99 mg/dL (ref 0.57–1.00)
Glucose: 133 mg/dL — ABNORMAL HIGH (ref 70–99)
Potassium: 4.9 mmol/L (ref 3.5–5.2)
Sodium: 143 mmol/L (ref 134–144)
eGFR: 58 mL/min/{1.73_m2} — ABNORMAL LOW (ref >59)

## 2022-02-05 LAB — TSH: TSH: 4.44 u[IU]/mL (ref 0.450–4.500)

## 2022-02-05 MED ORDER — AMIODARONE HCL 100 MG PO TABS: 100.0000 mg | ORAL_TABLET | Freq: Every day | ORAL | 3 refills | Status: DC

## 2022-02-05 NOTE — Patient Instructions (Addendum)
Medication Instructions:  ?DECREASE YOUR AMIODARONE TO 100 MG DAILY  ? ?*If you need a refill on your cardiac medications before your next appointment, please call your pharmacy* ? ?Lab Work: ?BMET/TSH TODAY  ? ?Testing/Procedures: ?NONE ? ?Follow-Up: ?At [facility], you and your health needs are our priority.  As part of our continuing mission to provide you with exceptional heart care, we have created designated Provider Care Teams.  These Care Teams include your primary Cardiologist (physician) and Advanced Practice Providers (APPs -  Physician Assistants and Nurse Practitioners) who all work together to provide you with the care you need, when you need it. ? ?We recommend signing up for the patient portal called "MyChart".  Sign up information is provided on this After Visit Summary.  MyChart is used to connect with patients for Virtual Visits (Telemedicine).  Patients are able to view lab/test results, encounter notes, upcoming appointments, etc.  Non-urgent messages can be sent to your provider as well.   ?To learn more about what you can do with MyChart, go to [email or web address].   ? ?Your next appointment:   ?3 month(s) ? ?The format for your next appointment:   ?In Person ? ?Provider:   ?Rollene Rotunda, MD { ? ?

## 2022-02-21 ENCOUNTER — Other Ambulatory Visit: Payer: Self-pay | Admitting: Cardiology

## 2022-02-22 ENCOUNTER — Encounter: Payer: Self-pay | Admitting: Cardiology

## 2022-02-22 MED ORDER — AMIODARONE HCL 200 MG PO TABS: 100.0000 mg | ORAL_TABLET | Freq: Every day | ORAL | 3 refills | Status: DC

## 2022-03-06 ENCOUNTER — Other Ambulatory Visit: Payer: Self-pay | Admitting: Cardiology

## 2022-03-13 ENCOUNTER — Ambulatory Visit: Payer: Medicare Other | Admitting: Internal Medicine

## 2022-03-19 ENCOUNTER — Other Ambulatory Visit: Payer: Self-pay | Admitting: Cardiology

## 2022-03-19 ENCOUNTER — Encounter: Payer: Self-pay | Admitting: Cardiology

## 2022-03-19 MED ORDER — EZETIMIBE 10 MG PO TABS: 10.0000 mg | ORAL_TABLET | Freq: Every day | ORAL | 1 refills | Status: DC

## 2022-03-21 ENCOUNTER — Encounter: Payer: Self-pay | Admitting: Internal Medicine

## 2022-03-21 ENCOUNTER — Ambulatory Visit: Payer: Medicare Other | Admitting: Internal Medicine

## 2022-03-21 VITALS — BP 128/62 | HR 67 | Ht 68.0 in | Wt 280.4 lb

## 2022-03-21 DIAGNOSIS — R7303 Prediabetes: Secondary | ICD-10-CM

## 2022-03-21 DIAGNOSIS — E041 Nontoxic single thyroid nodule: Secondary | ICD-10-CM

## 2022-03-21 DIAGNOSIS — E278 Other specified disorders of adrenal gland: Secondary | ICD-10-CM | POA: Diagnosis not present

## 2022-03-21 LAB — POCT GLYCOSYLATED HEMOGLOBIN (HGB A1C): Hemoglobin A1C: 6.4 % — AB (ref 4.0–5.6)

## 2022-03-21 NOTE — Patient Instructions (Addendum)
Let's check a new thyroid U/S. ? ?Please come back for a follow-up appointment in 1 year. ?

## 2022-03-21 NOTE — Progress Notes (Addendum)
Patient ID: Victoria Delgado, female   DOB: 1941/09/09, 81 y.o.   MRN: [id number]  ? ?This visit occurred during the SARS-CoV-2 public health emergency.  Safety protocols were in place, including screening questions prior to the visit, additional usage of staff PPE, and extensive cleaning of exam room while observing appropriate contact time as indicated for disinfecting solutions.  ? 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  Last visit was 1 year ago. ? ?Interim history: ?Before last visit, she had [facility]AVR at [facility].  ?She had multiple medical problems since last visit: ?In 11/2021, she had COVID-19.  At that time, she was admitted (ICU for 6 days)  with increased troponins, increased lactic acid.  An HbA1c was checked and this was higher. She still has SOB. ?She developed a sore throat - possibly GERD-related - improved on Prilosec. ?She had a severe UTI (with bloody urine) in 12/2021. ?She developed paroxysmal A-fib while in the hospital with COVID and was finally able to have cardioversion in 01/2022. ?She also broke a tooth.  She has an appointment with her dentist coming up. ? ?Adrenal incidentaloma ?Patient's adrenal mass was incidentally found during investigation for hemorrhagic cystitis.  She has a long standing history of back pain. ? ?I reviewed patient's previous imaging records: ?Abdominal CT (06/22/2017): 2.5 cm R adrenal nodule, 15 HU, not present in 2004; likely adenoma. ?Abdominal MRI (07/24/2017): hypointense nodule with homogeneous T2 signal and signal dropout consistent with benign adenoma ?Adrenal CT  (09/08/2019): Stable adrenal mass ?Abdominal/pelvic CT (12/18/2020): Stable right adrenal adenoma measuring 2.5 cm ? ?Reviewed previous adrenal investigation: Normal: ?Component ?    Latest Ref Rng 03/07/2021 03/14/2021  ?Epinephrine ?    pg/mL 20    ?Norepinephrine ?    pg/mL 832    ?Dopamine ?    pg/mL 22 (H)    ?Total  Catecholamines ?    pg/mL 874    ?Metanephrine, Pl ?    <=57 pg/mL <25    ?Normetanephrine, Pl ?    <=148 pg/mL 132    ?Total Metanephrines-Plasma ?    <=205 pg/mL 132    ?ALDOSTERONE ?     ng/dL 7    ?Renin Activity ?    0.25 - 5.82 ng/mL/h 0.90    ?ALDO / PRA Ratio ?    0.9 - 28.9 Ratio 7.8    ?Cortisol,F,ug/L,U ?    Undefined ug/L 7    ?Cortisol (Ur), Free ?    6 - 42 ug/24 hr 21    ?Creatinine, Urine ?    Not Estab. mg/dL  00.9   ?Creatinine, 24H Ur ?    800 - 1,800 mg/24 hr  1,053   ?Potassium ?    3.5 - 5.1 mEq/L 4.1    ?  ?Component ?    Latest Ref Rng & Units 02/24/2020  ?Epinephrine ?    pg/mL 30  ?Norepinephrine ?    pg/mL 574  ?Dopamine ?    pg/mL 15  ?Total Catecholamines ?    pg/mL 619  ? ?Component ?    Latest Ref Rng & Units 02/24/2020 02/29/2020  ?Metanephrine, Pl ?    <=57 pg/mL <25   ?Normetanephrine, Pl ?    <=148 pg/mL 90   ?Total Metanephrines-Plasma ?    <=205 pg/mL 90   ?ALDOSTERONE ?    * ng/dL 7   ?Renin Activity ?    0.25 -  5.82 ng/mL/h 0.97   ?ALDO / PRA Ratio ?    0.9 - 28.9 Ratio 7.2   ?24 Hour urine volume (VMAHVA) ?    mL  3,000  ?Cortisol (Ur), Free ?    4.0 - 50.0 mcg/24 h  14.4  ?Results received ?    0.50 - 2.15 g/24 h  1.38  ?Potassium ?    3.5 - 5.1 mEq/L 4.0   ? ?Component ?    Latest Ref Rng & Units 11/07/2018  ?24 Hour urine volume (VMAHVA) ?    mL 2,150  ?Cortisol (Ur), Free ?    4.0 - 50.0 mcg/24 h 12.0  ?Results received ?    0.50 - 2.15 g/24 h 1.51  ?Extra Urine Specimen ?       ?Creatinine, 24H Ur ?    0.50 - 2.15 g/24 h 1.51  ? ?Component ?    Latest Ref Rng & Units 11/05/2018  ?Epinephrine ?    pg/mL 27  ?Norepinephrine ?    pg/mL 842  ?Dopamine ?    pg/mL see note  ?Catecholamines, Total ?    pg/mL 869  ?Metanephrine, Pl ?    <=57 pg/mL <25  ?Normetanephrine, Pl ?    <=148 pg/mL 183 (H)  ?Total Metanephrines-Plasma ?    <=205 pg/mL 183  ?Potassium ?    3.5 - 5.1 mEq/L 4.3  ? ?Component ?    Latest Ref Rng & Units 11/05/2018  ?ALDOSTERONE ?     ng/dL 10  ?Renin Activity ?     0.25 - 5.82 ng/mL/h 1.24  ?ALDO / PRA Ratio ?    0.9 - 28.9 Ratio 8.1  ? ?Component ?    Latest Ref Rng & Units 11/05/2017 11/08/2017  ?Epinephrine ?    pg/mL see note   ?Norepinephrine ?    pg/mL 471   ?Dopamine ?    pg/mL see note   ?Catecholamines, Total ?    pg/mL 471   ?Aldosterone, Serum ?     ng/dL 4   ?Renin Activity ?    0.25 - 5.82 ng/mL/h 0.88   ?ALDO / PRA Ratio ?    0.9 - 28.9 Ratio 4.5   ?Metanephrine, Pl ?    <=57 pg/mL 27   ?Normetanephrine, Pl ?    <=148 pg/mL 134   ?Total Metanephrines-Plasma ?    <=205 pg/mL 161   ?24 Hour urine volume (VMAHVA) ?    mL  2,250  ?Cortisol (Ur), Free ?    4.0 - 50.0 mcg/24 h  14.4  ?Results received ?    0.50 - 2.15 g/24 h  1.46  ?Potassium ?    3.5 - 5.1 mEq/L 4.5   ? ?She has a history of HTN and aortic stenosis.  She is on metoprolol, Cardizem, furosemide. ? ?Prediabetes: ?-Diagnosed after starting statins ? ?Reviewed HbA1c levels: ?Lab Results  ?Component Value Date  ? HGBA1C 6.8 (H) 11/24/2021  ? HGBA1C 5.9 02/24/2020  ? HGBA1C 6.1 (A) 11/05/2018  ?12/12/2020: HbA1c 5.3% ?09/26/2020: HbA1c 5.8% ?2019: HbA1c: 6.3%. ? ?She was checking sugars 0 to once a day. Now not checking. From last OV: ?- am: 135-150, 160 >> 100-110 >> 100-115 ?- 2h after b'fast: n/c >> 138 >> n/c ?- lunch: n/c >> 94-112 >> 90s-105 ?- 2h after lunch: n/c  ?- dinner: 102-107 >> 85, 91-101 >> 80s-90s ?- 2h after dinner: n/c >> 96-125 >> n/c ?- bedtime: n/c ? ?No CKD: ?Lab Results  ?Component  Value Date  ? BUN 17 02/05/2022  ? ?Lab Results  ?Component Value Date  ? CREATININE 0.99 02/05/2022  ? ?+ HL: ?Lab Results  ?Component Value Date  ? CHOL 189 03/28/2021  ? HDL 38 (L) 03/28/2021  ? LDLCALC 116 (H) 03/28/2021  ? TRIG 196 (H) 03/28/2021  ? CHOLHDL 5.0 (H) 03/28/2021  ?She is on Crestor 20 mg daily (increased) and still has muscle aches and ankle pain.  She feels that this is increasing her blood sugars. Also, on Zetia. ? 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 ? ?Latest eye exam 08/15/2021: no DR. She had a history of 4 episodes of retinal detachment. ? ?She has a L thyroid nodule per recent chest CT (11/23/2019).  ? ?We checked a thyroid ultrasound at last visit and then a biopsy.  This returned inconclusive: ?Thyroid ultrasound (03/29/2020): ?Parenchymal Echotexture: Normal ?Isthmus: 0.2 cm ?Right lobe: 4.1 cm x 1.1 cm x 1.5 cm ?Left lobe: 3.6 cm x 2.5 cm x 2.3 cm ?_________________________________________________________ ?  ?Nodule # 1: ?Location: Left; Mid ?Maximum size: 3.0 cm; Other 2 dimensions: 2.1 cm x 1.9 cm ?Composition: solid/almost completely solid (2) ?Echogenicity: hypoechoic (2) ?Echogenic foci: punctate echogenic foci (3) ?Nodule meets criteria for biopsy ?________________________________________________________ ?  ?No adenopathy ?  ?IMPRESSION: ?Left thyroid nodule (labeled 1, 3.0 cm) meets criteria for biopsy, ?as designated by the newly established ACR TI-RADS criteria, and ?referral for biopsy is recommended. ?  ?04/08/2020: FNA: inconclusive: ?Clinical History: None provided  ?Specimen Submitted:  A. THYROID, LMP, FINE NEEDLE ASPIRATION:  ? ? ?FINAL MICROSCOPIC DIAGNOSIS:  ?- Scant follicular epithelium present (Bethesda category I)  ? ?SPECIMEN ADEQUACY:  ?Satisfactory but limited for evaluation, scant cellularity  ? ?We discussed about the need to repeat the Bx in ~ 6 months. ? ?Thyroid U/S (03/14/2021): ?Parenchymal Echotexture: Mildly heterogenous ?Isthmus: 5 mm ?Right lobe: 3.8 x 1.5 x 1.6 cm ?Left lobe: 4.6 x 2.7 x 3.0 cm ?_________________________________________________________ ?  ?Estimated total number of nodules >/= 1 cm: 2 ?_________________________________________________________ ?  ?Nodule # 1: ?Location: Right; Inferior ?Maximum size: 1.2 cm; Other 2 dimensions: 0.8 x 1.2 cm ?Composition: solid/almost completely solid (2) ?Echogenicity: hypoechoic (2) ?  ?*Given size (>/= 1 - 1.4 cm) and appearance, a follow-up ultrasound ?in 1  year should be considered based on TI-RADS criteria. ?_________________________________________________________ ?  ?The previously biopsied left mid thyroid TR 5 nodule measures 3.1 x ?2.3 x 2.5 cm, previously 3.0 x 1.9 x

## 2022-03-23 ENCOUNTER — Other Ambulatory Visit: Payer: Medicare Other

## 2022-03-26 ENCOUNTER — Telehealth: Payer: Self-pay | Admitting: Cardiology

## 2022-03-26 DIAGNOSIS — I350 Nonrheumatic aortic (valve) stenosis: Secondary | ICD-10-CM

## 2022-03-26 MED ORDER — AZITHROMYCIN 500 MG PO TABS: ORAL_TABLET | ORAL | 2 refills | Status: DC

## 2022-03-26 NOTE — Telephone Encounter (Signed)
? ?  Pre-operative Risk Assessment  ?  ?Patient Name: Victoria Delgado  ?DOB: 12/24/1940 ?MRN: [id number]  ? ? ? ?Request for Surgical Clearance   ? ?Procedure:  Dental Extraction - Amount of Teeth to be Pulled:  1, tooth #7 ? ?Date of Surgery:  Clearance 02/26/22                              ?   ?Surgeon:  Dr. Peggye Ley ?Surgeon's Group or Practice Name:  [facility] ?Phone number:  [phone] ?Fax number:  [phone] ?  ?Type of Clearance Requested:   ?- Medical  ?- Pharmacy:  Hold Apixaban (Eliquis) TBD by Cardiology  ?  ?Type of Anesthesia:   Mepivacaine with no EPI ?  ?Additional requests/questions:   Wants to know if it is okay to use the antibiotic Clindamycin ? ?Signed, ?Brooks Sailors   ?03/26/2022, 2:42 PM   ?

## 2022-03-26 NOTE — Telephone Encounter (Signed)
Per pre op provider I called D[facility] office to clarify about SBE request for Clindamycin. In my conversation with Okey Regal with the D[facility] office it was discovered the pt is allergic to PCN. I then reviewed the allergy list in the pt's chart and it does reflect that she is allergic to PCN. I will update the pre op provider with this information.  ?

## 2022-03-26 NOTE — Telephone Encounter (Signed)
I s/w the pt and informed her that she has been cleared for her dental work. She has been informed that since she had had TAVR she will not able to take Clindamycin any longer. Pt aware that Zithromax 500 mg has been called into her pharmacy. Pt is grateful for the great attention to detail for her SBE. I assured the pt that I will fax these notes to the D[facility] office. Pt again said thank you! ?

## 2022-03-26 NOTE — Telephone Encounter (Signed)
? ?  Patient Name: Victoria Delgado  ?DOB: January 08, 1941 ?MRN: [id number] ? ?Primary Cardiologist: Rollene Rotunda, MD ? ?Chart reviewed as part of pre-operative protocol coverage.  ? ?Dental extractions of one or two teeth are considered low risk procedures per guidelines and generally do not require any specific cardiac clearance. It is also generally accepted that for simple extractions and dental cleanings, there is no need to interrupt blood thinner therapy. ? ? ?SBE prophylaxis is required for the patient from a cardiac standpoint. ? ?Clindamycin is no longer recommended for SBE PPX for valve patients. Pt has a history of TAVR.  ? ?May use azithromycin 500 mg or doxycycline 100 mg in the setting of PCN allergy.  ? ?I have sent in azithromycin to her pharmacy.  ? ?I will route this recommendation to the requesting party via Epic fax function and remove from pre-op pool. ? ?Please call with questions. ? ?Marcelino Duster, PA ?03/26/2022, 5:05 PM ? ?

## 2022-03-26 NOTE — Telephone Encounter (Addendum)
Clarification: PCN allergy and SBE PPX indication: ?Guidelines indicate using: ?Azithromycin 500 mg or cephalexin 2g or doxycycline 100 mg. ? ? ?

## 2022-03-28 ENCOUNTER — Ambulatory Visit
Admission: RE | Admit: 2022-03-28 | Discharge: 2022-03-28 | Disposition: A | Discharge status: Home / Self Care | Payer: Medicare Other | Source: Ambulatory Visit | Attending: Internal Medicine | Admitting: Internal Medicine

## 2022-03-28 DIAGNOSIS — E041 Nontoxic single thyroid nodule: Secondary | ICD-10-CM

## 2022-04-21 ENCOUNTER — Other Ambulatory Visit: Payer: Self-pay | Admitting: Cardiology

## 2022-04-27 ENCOUNTER — Ambulatory Visit (HOSPITAL_COMMUNITY)
Admission: RE | Admit: 2022-04-27 | Discharge: 2022-04-27 | Disposition: A | Discharge status: Home / Self Care | Payer: Medicare Other | Source: Ambulatory Visit | Attending: Internal Medicine | Admitting: Internal Medicine

## 2022-04-27 DIAGNOSIS — R0602 Shortness of breath: Secondary | ICD-10-CM | POA: Diagnosis present

## 2022-04-27 LAB — PULMONARY FUNCTION TEST
DL/VA % pred: 85 %
DL/VA: 3.38 ml/min/mmHg/L
DLCO unc % pred: 65 %
DLCO unc: 14.05 ml/min/mmHg
FEF 25-75 Post: 1.7 L/sec
FEF 25-75 Pre: 1.07 L/sec
FEF2575-%Change-Post: 58 %
FEF2575-%Pred-Post: 97 %
FEF2575-%Pred-Pre: 61 %
FEV1-%Change-Post: 9 %
FEV1-%Pred-Post: 70 %
FEV1-%Pred-Pre: 64 %
FEV1-Post: 1.69 L
FEV1-Pre: 1.55 L
FEV1FVC-%Change-Post: 4 %
FEV1FVC-%Pred-Pre: 99 %
FEV6-%Change-Post: 3 %
FEV6-%Pred-Post: 70 %
FEV6-%Pred-Pre: 68 %
FEV6-Post: 2.15 L
FEV6-Pre: 2.08 L
FEV6FVC-%Change-Post: 0 %
FEV6FVC-%Pred-Post: 103 %
FEV6FVC-%Pred-Pre: 104 %
FVC-%Change-Post: 4 %
FVC-%Pred-Post: 68 %
FVC-%Pred-Pre: 65 %
FVC-Post: 2.18 L
FVC-Pre: 2.09 L
Post FEV1/FVC ratio: 78 %
Post FEV6/FVC ratio: 99 %
Pre FEV1/FVC ratio: 74 %
Pre FEV6/FVC Ratio: 100 %
RV % pred: 115 %
RV: 2.95 L
TLC % pred: 93 %
TLC: 5.26 L

## 2022-04-27 MED ORDER — ALBUTEROL SULFATE (2.5 MG/3ML) 0.083% IN NEBU
2.5000 mg | INHALATION_SOLUTION | Freq: Once | RESPIRATORY_TRACT | Status: AC
Start: 2022-04-27 — End: 2022-04-27
  Administered 2022-04-27: 2.5 mg via RESPIRATORY_TRACT

## 2022-05-14 DIAGNOSIS — Z952 Presence of prosthetic heart valve: Secondary | ICD-10-CM | POA: Insufficient documentation

## 2022-05-14 NOTE — Progress Notes (Signed)
Cardiology Office Note   Date:  05/15/2022   ID:  Victoria Delgado, DOB [birth year], MRN [id number]  PCP:  Charlane Ferretti, DO  Cardiologist:   Rollene Rotunda, MD   Chief Complaint  Patient presents with   Fatigue      History of Present Illness: Victoria Delgado is a 81 y.o. female who presents for follow up of CAD.  She has atrial fibrillation which she ultimately thinks was related to a steroid injection. This was in 2008. At that time she was found to have asymmetric septal hypertrophy. This has been followed conservatively.  Echocardiogram did demonstrate concentric LV hypertrophy which was moderate and moderate LAE.  In May of 2017 she was in the hospital with CAD.  She was admitted to [facility] on 5/24 after waking up with left arm pain.  She had positive troponin with  PCI to the mid LCX and 1st DX. She is on DAPT with aspirin/Effient.  She had 2 drug-eluting stents placed in the circumflex leading into a marginal. This was apparently somewhat small circumflex. She also had a diagonal had a drug-eluting stent placed.  An echo in our office showed well preserved ejection fraction. There was some moderate aortic stenosis. There was no mention of hypertrophic obstruction although there is LVH.  She was in the hospital in April 2019 with chest pain.    She did have positive cardiac enzymes.   She underwent cardiac catheterization on 03/19/2018, this showed patent stent in the proximal to mid left circumflex artery, 20% ostial left circumflex artery disease, 40% OM 3 disease, widely patent stent in the ostial D1, 20% ostial to proximal LAD disease, 20% mid LAD disease.  Cardiac catheterization did confirm moderate aortic stenosis with mean gradient of 26.7 mmHg.  Medical therapy was recommended for her coronary artery disease.  She had a follow up echo and her AS was severe to critical.  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  (23 mm Edwards Sapien S3 Ultra).  She was in the hospital and December with  COVID.  She had atrial fibrillation with rapid ventricular response.  She was treated with IV amiodarone and sent home on oral amiodarone.  She was treated with anticoagulation.   Of note she had normal TAVR gradients with moderate mitral stenosis on her echo at the time of her admission in December. She had a urinary infection and was in the ER for this.  I do note that she had elevated troponin but this was thought to be demand ischemia. She had cardioversion on February 1.    She has been doing relatively well.  She has had no episodes of atrial fibrillation.  She brings her devices and we can see 0 atrial fibrillation burden on her Apple Watch.  She does report some fatigue.  It looks like she had some oxygen saturations seem to dip at night.  However, I looked at her device interrogation for her CPAP and its working well with an AHI of 0.7.  She only gets about 5 hours and 49 minutes per night of sleep however.  She is not having any new shortness of breath, PND or orthopnea.  She is not having any new chest pressure, neck or arm discomfort.  Past Medical History:  Diagnosis Date   Abdominal pain    Arthritis    osteoarthritis-hips,knees, back, hands   Asymmetric septal hypertrophy (HCC)    Atrial fibrillation (HCC)    CAD (coronary artery disease)  2 drug-eluting stents placed in the circumflex leading into a marginal.  May 2017.  [name].   catheterization on 03/19/2018, this showed patent stent in the proximal to mid left circumflex artery, 20% ostial left circumflex artery disease, 40% OM 3 disease, widely patent stent in the ostial D1, 20% ostial to proximal LAD disease, 20% mid LAD disease.   Cataract    corrective surgery done   Fatty liver    Gallstones    GERD (gastroesophageal reflux disease)    Hepatitis    hepatitis A; past hx.,many yrs ago ? food source   Hernia, hiatal    Hyperlipidemia    Hypertension    Obesity    Sleep apnea    CPAP    Past Surgical History:   Procedure Laterality Date   APPENDECTOMY  2004   CARDIOVERSION N/A 01/03/2022   Procedure: CARDIOVERSION;  Surgeon: Sande Rives, MD;  Location: [facility] ENDOSCOPY;  Service: Cardiovascular;  Laterality: N/A;   CATARACT EXTRACTION W/ INTRAOCULAR LENS IMPLANT     both eyes   CHOLECYSTECTOMY     LEFT HEART CATH AND CORONARY ANGIOGRAPHY N/A 03/19/2018   Procedure: LEFT HEART CATH AND CORONARY ANGIOGRAPHY;  Surgeon: Kathleene Hazel, MD;  Location: [facility] INVASIVE CV LAB;  Service: Cardiovascular;  Laterality: N/A;   NM MYOCAR PERF WALL MOTION  08/15/04   No ischemia   RETINAL DETACHMENT SURGERY Bilateral    remains with slight hazy vision with lights   RIGHT/LEFT HEART CATH AND CORONARY ANGIOGRAPHY N/A 07/11/2020   Procedure: RIGHT/LEFT HEART CATH AND CORONARY ANGIOGRAPHY;  Surgeon: Tonny Bollman, MD;  Location: [facility] INVASIVE CV LAB;  Service: Cardiovascular;  Laterality: N/A;   TOTAL HIP ARTHROPLASTY Right 08/04/2014   Procedure: RIGHT TOTAL HIP ARTHROPLASTY ANTERIOR APPROACH;  Surgeon: Loanne Drilling, MD;  Location: [facility] ORS;  Service: Orthopedics;  Laterality: Right;   TOTAL HIP ARTHROPLASTY Left 01/05/2015   Procedure: LEFT TOTAL HIP ARTHROPLASTY ANTERIOR APPROACH;  Surgeon: Loanne Drilling, MD;  Location: [facility] ORS;  Service: Orthopedics;  Laterality: Left;   US ECHOCARDIOGRAPHY  06/25/2012   Moderate ASH,LV hyperdynamic,LA is mod. dilated,trace MR,TR,AI     Current Outpatient Medications  Medication Sig Dispense Refill   acetaminophen (TYLENOL) 500 MG tablet Take 1,000 mg by mouth every 6 (six) hours as needed for moderate pain or headache.     albuterol (VENTOLIN HFA) 108 (90 Base) MCG/ACT inhaler Inhale 2 puffs into the lungs every 4 (four) hours as needed for wheezing or shortness of breath.      alum & mag hydroxide-simeth (GELUSIL) 200-200-25 MG chewable tablet Taking 2-4 chewables by mouth daily     apixaban (ELIQUIS) 5 MG TABS tablet Take 1 tablet (5 mg total) by mouth 2 (two) times  daily. 60 tablet 5   azithromycin (ZITHROMAX) 500 MG tablet Take 30-60 min prior to dental procedures. 1 tablet 2   cholecalciferol (VITAMIN D3) 25 MCG (1000 UNIT) tablet Take 1,000 Units by mouth 2 (two) times daily.     Coenzyme Q10 (COQ10) 100 MG CAPS Take 100 mg by mouth daily.     CRESTOR 20 MG tablet TAKE 1 TABLET(20 MG) BY MOUTH DAILY 90 tablet 3   ezetimibe (ZETIA) 10 MG tablet Take 1 tablet (10 mg total) by mouth daily. 90 tablet 1   furosemide (LASIX) 40 MG tablet Take 1 tablet (40 mg total) by mouth daily. 90 tablet 3   loteprednol (LOTEMAX) 0.5 % ophthalmic suspension Place 1 drop into both eyes  daily as needed (imflammation).     metoprolol succinate (TOPROL XL) 50 MG 24 hr tablet Take 1 tablet (50 mg total) by mouth in the morning and at bedtime. 180 tablet 3   Multiple Vitamin (MULTIVITAMIN) tablet Take 1 tablet by mouth 2 (two) times a week.     nitroGLYCERIN (NITROSTAT) 0.4 MG SL tablet Place 1 tablet (0.4 mg total) under the tongue every 5 (five) minutes as needed for chest pain. 25 tablet 3   omeprazole (PRILOSEC) 10 MG capsule Take 2 capsules (20 mg total) by mouth daily. 60 capsule 6   Probiotic Product (PROBIOTIC DAILY PO) Take 1 capsule by mouth daily.     Propylene Glycol (SYSTANE BALANCE) 0.6 % SOLN Place 1 drop into both eyes 2 (two) times daily as needed (dry eyes).     No current facility-administered medications for this visit.    Allergies:   Codeine, Kenalog [triamcinolone acetonide], Kenalog [triamcinolone], Pantoprazole, Relafen [nabumetone], Epinephrine, Penicillins, and Tramadol    ROS:  Please see the history of present illness.   Otherwise, review of systems are positive for none.   All other systems are reviewed and negative.    PHYSICAL EXAM: VS:  BP 124/62   Pulse 72   Ht  (1.727 m)   Wt 278 lb 9.6 oz (126.4 kg)   SpO2 95%   BMI 42.36 kg/m  , BMI Body mass index is 42.36 kg/m. GENERAL:  Well appearing NECK:  No jugular venous distention,  waveform within normal limits, carotid upstroke brisk and symmetric, no bruits, no thyromegaly LUNGS:  Clear to auscultation bilaterally CHEST:  Unremarkable HEART:  PMI not displaced or sustained,S1 and S2 within normal limits, no S3, no S4, no clicks, no rubs, 2 out of 6 apical systolic murmur nonradiating, no diastolic murmurs ABD:  Flat, positive bowel sounds normal in frequency in pitch, no bruits, no rebound, no guarding, no midline pulsatile mass, no hepatomegaly, no splenomegaly EXT:  2 plus pulses throughout, mild leg edema, no cyanosis no clubbing    EKG:  EKG is not ordered today. NA  Recent Labs: 11/24/2021: B Natriuretic Peptide 609.0 11/28/2021: ALT 24; Magnesium 2.2 12/21/2021: Platelets 193 01/03/2022: Hemoglobin 13.9 02/05/2022: BUN 17; Creatinine, Ser 0.99; Potassium 4.9; Sodium 143; TSH 4.440    Lipid Panel    Component Value Date/Time   CHOL 189 03/28/2021 0918   TRIG 196 (H) 03/28/2021 0918   HDL 38 (L) 03/28/2021 0918   CHOLHDL 5.0 (H) 03/28/2021 0918   CHOLHDL 5.1 03/17/2018 0034   VLDL 37 03/17/2018 0034   LDLCALC 116 (H) 03/28/2021 0918      Wt Readings from Last 3 Encounters:  05/15/22 278 lb 9.6 oz (126.4 kg)  03/21/22 280 lb 6.4 oz (127.2 kg)  02/05/22 281 lb (127.5 kg)      Other studies Reviewed: Additional studies/ records that were reviewed today include: Review of wearable Review of the above records demonstrates:  Please see elsewhere in the note.     ASSESSMENT AND PLAN:  ATRIAL FIB:   She has Ms. Victoria Delgado has a CHA2DS2 - VASc score 5.  Today she can stop her amiodarone as she has a 0% burden.  CAD:  She had no new symptoms.  No change in therapy.  Sleep apnea - As mentioned above I looked at her device interrogation and she has normal function.  She is just not sleeping enough and I asked her to discuss insomnia with her primary  LVH-  This was moderate in echo in December.  I will follow-up again with an echo in  December.  AS - She understands endocarditis prophylaxis.   She will get another echocardiogram in December.  MS - This was previously moderate and will be followed as above.  Dyslipidemia - LDL was 47 with an HDL 23.  No change in therapy.  Hypertension  The blood pressure is well controlled.  No change in therapy.  Fatigue  I will most likely think this is related to insomnia mentioned above.  CBC has been fine.  Thyroid was fine about 6 months ago.    Current medicines are reviewed at length with the patient today.  The patient does not have concerns regarding medicines.  The following changes have been made: As above  Labs/ tests ordered today include:   Orders Placed This Encounter  Procedures   Lipid panel   ECHOCARDIOGRAM COMPLETE     Disposition:   FU with me in January  Signed, Rollene Rotunda, MD  05/15/2022 2:13 PM    [facility]

## 2022-05-15 ENCOUNTER — Ambulatory Visit: Payer: Medicare Other | Admitting: Cardiology

## 2022-05-15 ENCOUNTER — Encounter: Payer: Self-pay | Admitting: Cardiology

## 2022-05-15 VITALS — BP 124/62 | HR 72 | Ht 68.0 in | Wt 278.6 lb

## 2022-05-15 DIAGNOSIS — Z952 Presence of prosthetic heart valve: Secondary | ICD-10-CM | POA: Diagnosis not present

## 2022-05-15 DIAGNOSIS — I2510 Atherosclerotic heart disease of native coronary artery without angina pectoris: Secondary | ICD-10-CM | POA: Diagnosis not present

## 2022-05-15 DIAGNOSIS — I517 Cardiomegaly: Secondary | ICD-10-CM | POA: Diagnosis not present

## 2022-05-15 DIAGNOSIS — E785 Hyperlipidemia, unspecified: Secondary | ICD-10-CM

## 2022-05-15 DIAGNOSIS — I10 Essential (primary) hypertension: Secondary | ICD-10-CM

## 2022-05-15 DIAGNOSIS — I480 Paroxysmal atrial fibrillation: Secondary | ICD-10-CM | POA: Diagnosis not present

## 2022-05-15 NOTE — Patient Instructions (Signed)
Medication Instructions:  STOP amiodarone  *If you need a refill on your cardiac medications before your next appointment, please call your pharmacy*  Testing/Procedures: Your physician has requested that you have an echocardiogram in DECEMBER 2023. Echocardiography is a painless test that uses sound waves to create images of your heart. It provides your doctor with information about the size and shape of your heart and how well your heart's chambers and valves are working. This procedure takes approximately one hour. There are no restrictions for this procedure.  Follow-Up: At [facility], you and your health needs are our priority.  As part of our continuing mission to provide you with exceptional heart care, we have created designated Provider Care Teams.  These Care Teams include your primary Cardiologist (physician) and Advanced Practice Providers (APPs -  Physician Assistants and Nurse Practitioners) who all work together to provide you with the care you need, when you need it.  We recommend signing up for the patient portal called "MyChart".  Sign up information is provided on this After Visit Summary.  MyChart is used to connect with patients for Virtual Visits (Telemedicine).  Patients are able to view lab/test results, encounter notes, upcoming appointments, etc.  Non-urgent messages can be sent to your provider as well.   To learn more about what you can do with MyChart, go to [email or web address].    Your next appointment:   December (after echo)  The format for your next appointment:   In Person  Provider:   Rollene Rotunda, MD {   Important Information About Sugar

## 2022-05-16 LAB — LIPID PANEL
Chol/HDL Ratio: 3.9 ratio (ref 0.0–4.4)
Cholesterol, Total: 162 mg/dL (ref 100–199)
HDL: 42 mg/dL (ref >39)
LDL Chol Calc (NIH): 88 mg/dL (ref 0–99)
Triglycerides: 184 mg/dL — ABNORMAL HIGH (ref 0–149)
VLDL Cholesterol Cal: 32 mg/dL (ref 5–40)

## 2022-05-18 ENCOUNTER — Other Ambulatory Visit: Payer: Self-pay | Admitting: *Deleted

## 2022-05-18 ENCOUNTER — Encounter: Payer: Self-pay | Admitting: Cardiology

## 2022-05-18 DIAGNOSIS — E785 Hyperlipidemia, unspecified: Secondary | ICD-10-CM

## 2022-05-18 MED ORDER — CRESTOR 40 MG PO TABS: 40.0000 mg | ORAL_TABLET | Freq: Every day | ORAL | 3 refills | Status: DC

## 2022-05-23 ENCOUNTER — Other Ambulatory Visit: Payer: Self-pay | Admitting: Internal Medicine

## 2022-05-23 DIAGNOSIS — R0602 Shortness of breath: Secondary | ICD-10-CM

## 2022-06-12 ENCOUNTER — Other Ambulatory Visit: Payer: Self-pay | Admitting: Cardiology

## 2022-06-12 DIAGNOSIS — I480 Paroxysmal atrial fibrillation: Secondary | ICD-10-CM

## 2022-06-13 NOTE — Telephone Encounter (Signed)
Eliquis 5mg  refill request received. Patient is 81 years old, weight-126.4kg, Crea-0.99 on 02/05/2022, Diagnosis-Afib, and last seen by Dr. 04/07/2022 on 05/15/2022. Dose is appropriate based on dosing criteria. Will send in refill to requested pharmacy.

## 2022-06-14 ENCOUNTER — Other Ambulatory Visit: Payer: Self-pay | Admitting: Cardiology

## 2022-06-14 DIAGNOSIS — R0602 Shortness of breath: Secondary | ICD-10-CM

## 2022-06-19 ENCOUNTER — Ambulatory Visit
Admission: RE | Admit: 2022-06-19 | Discharge: 2022-06-19 | Disposition: A | Discharge status: Home / Self Care | Payer: Medicare Other | Source: Ambulatory Visit | Attending: Internal Medicine | Admitting: Internal Medicine

## 2022-06-19 DIAGNOSIS — R0602 Shortness of breath: Secondary | ICD-10-CM

## 2022-06-25 NOTE — Progress Notes (Unsigned)
Synopsis: Referred for dyspnea by Charlane Ferretti, DO  Subjective:   PATIENT ID: Victoria Delgado GENDER: female DOB: [birth year], MRN: [id number]  No chief complaint on file.  80yF with history of CAD, AF, asymmetric septal hypertrophy, GERD/hiatal hernia, OSA on CPAP, moderate MR, AS s/p TAVR, former smoker, covid-19 infection referred for dyspnea  Otherwise pertinent review of systems is negative.  Past Medical History:  Diagnosis Date   Abdominal pain    Arthritis    osteoarthritis-hips,knees, back, hands   Asymmetric septal hypertrophy (HCC)    Atrial fibrillation (HCC)    CAD (coronary artery disease)    2 drug-eluting stents placed in the circumflex leading into a marginal.  May 2017.  [name].   catheterization on 03/19/2018, this showed patent stent in the proximal to mid left circumflex artery, 20% ostial left circumflex artery disease, 40% OM 3 disease, widely patent stent in the ostial D1, 20% ostial to proximal LAD disease, 20% mid LAD disease.   Cataract    corrective surgery done   Fatty liver    Gallstones    GERD (gastroesophageal reflux disease)    Hepatitis    hepatitis A; past hx.,many yrs ago ? food source   Hernia, hiatal    Hyperlipidemia    Hypertension    Obesity    Sleep apnea    CPAP     Family History  Problem Relation Age of Onset   Hypertension Mother    CVA Mother    Lung cancer Father 48       asbestos exposure   Cancer Father    Esophageal cancer Other        nephew   Diabetes Other        nephew   Colon cancer Neg Hx    Pancreatic cancer Neg Hx    Kidney disease Neg Hx    Liver disease Neg Hx      Past Surgical History:  Procedure Laterality Date   APPENDECTOMY  2004   CARDIOVERSION N/A 01/03/2022   Procedure: CARDIOVERSION;  Surgeon: Sande Rives, MD;  Location[facility] ENDOSCOPY;  Service: Cardiovascular;  Laterality: N/A;   CATARACT EXTRACTION W/ INTRAOCULAR LENS IMPLANT     both eyes   CHOLECYSTECTOMY     LEFT  HEART CATH AND CORONARY ANGIOGRAPHY N/A 03/19/2018   Procedure: LEFT HEART CATH AND CORONARY ANGIOGRAPHY;  Surgeon: Kathleene Hazel, MD;  Location: [facility] INVASIVE CV LAB;  Service: Cardiovascular;  Laterality: N/A;   NM MYOCAR PERF WALL MOTION  08/15/04   No ischemia   RETINAL DETACHMENT SURGERY Bilateral    remains with slight hazy vision with lights   RIGHT/LEFT HEART CATH AND CORONARY ANGIOGRAPHY N/A 07/11/2020   Procedure: RIGHT/LEFT HEART CATH AND CORONARY ANGIOGRAPHY;  Surgeon: Tonny Bollman, MD;  Location: [facility] INVASIVE CV LAB;  Service: Cardiovascular;  Laterality: N/A;   TOTAL HIP ARTHROPLASTY Right 08/04/2014   Procedure: RIGHT TOTAL HIP ARTHROPLASTY ANTERIOR APPROACH;  Surgeon: Loanne Drilling, MD;  Location: [facility] ORS;  Service: Orthopedics;  Laterality: Right;   TOTAL HIP ARTHROPLASTY Left 01/05/2015   Procedure: LEFT TOTAL HIP ARTHROPLASTY ANTERIOR APPROACH;  Surgeon: Loanne Drilling, MD;  Location: [facility] ORS;  Service: Orthopedics;  Laterality: Left;   US ECHOCARDIOGRAPHY  06/25/2012   Moderate ASH,LV hyperdynamic,LA is mod. dilated,trace MR,TR,AI    Social History   Socioeconomic History   Marital status: Married    Spouse name: Not on file   Number of children: 2  Years of education: Not on file   Highest education level: Not on file  Occupational History    Employer: OTHER  Tobacco Use   Smoking status: Former   Smokeless tobacco: Never   Tobacco comments:    QUIT IN 1990  Vaping Use   Vaping Use: Never used  Substance and Sexual Activity   Alcohol use: No    Alcohol/week: 0.0 standard drinks of alcohol   Drug use: No   Sexual activity: Yes  Other Topics Concern   Not on file  Social History Narrative   Two grandchildren.            Social Determinants of Health   Financial Resource Strain: Not on file  Food Insecurity: Not on file  Transportation Needs: Not on file  Physical Activity: Not on file  Stress: Not on file  Social Connections: Not on file   Intimate Partner Violence: Not on file     Allergies  Allergen Reactions   Codeine Nausea Only   Kenalog [Triamcinolone Acetonide] Other (See Comments)    Had A.fib after she received the injection."Steroid meds"   Kenalog [Triamcinolone] Rash    Other reaction(s): AFIB   Pantoprazole Rash    Other reaction(s): chronic upper GI pain all the way to back   Relafen [Nabumetone] Other (See Comments)    Edema    Epinephrine Palpitations   Penicillins Rash    40 years ago, injection site was red and swollen.  rash, facial/tongue/throat swelling, SOB or lightheadedness with hypotension: Yes Has patient had a PCN reaction causing immediate  Has patient had a PCN reaction causing severe rash involving mucus membranes or skin necrosis: NO Has patient had a PCN reaction that required hospitalization. No  Has patient had a PCN reaction occurring within the last 10 years: No If all of the above answers are "NO", then may proceed with Cephalosporin use.     Tramadol Other (See Comments)    Auditory halllucinations     Outpatient Medications Prior to Visit  Medication Sig Dispense Refill   acetaminophen (TYLENOL) 500 MG tablet Take 1,000 mg by mouth every 6 (six) hours as needed for moderate pain or headache.     albuterol (VENTOLIN HFA) 108 (90 Base) MCG/ACT inhaler Inhale 2 puffs into the lungs every 4 (four) hours as needed for wheezing or shortness of breath.      alum & mag hydroxide-simeth (GELUSIL) 200-200-25 MG chewable tablet Taking 2-4 chewables by mouth daily     azithromycin (ZITHROMAX) 500 MG tablet Take 30-60 min prior to dental procedures. 1 tablet 2   cholecalciferol (VITAMIN D3) 25 MCG (1000 UNIT) tablet Take 1,000 Units by mouth 2 (two) times daily.     Coenzyme Q10 (COQ10) 100 MG CAPS Take 100 mg by mouth daily.     CRESTOR 40 MG tablet Take 1 tablet (40 mg total) by mouth daily. 90 tablet 3   ELIQUIS 5 MG TABS tablet TAKE 1 TABLET(5 MG) BY MOUTH TWICE DAILY 60 tablet 5    ezetimibe (ZETIA) 10 MG tablet Take 1 tablet (10 mg total) by mouth daily. 90 tablet 1   furosemide (LASIX) 40 MG tablet TAKE 1 TABLET(40 MG) BY MOUTH DAILY 90 tablet 3   loteprednol (LOTEMAX) 0.5 % ophthalmic suspension Place 1 drop into both eyes daily as needed (imflammation).     metoprolol succinate (TOPROL XL) 50 MG 24 hr tablet Take 1 tablet (50 mg total) by mouth in the morning and at bedtime.  180 tablet 3   Multiple Vitamin (MULTIVITAMIN) tablet Take 1 tablet by mouth 2 (two) times a week.     nitroGLYCERIN (NITROSTAT) 0.4 MG SL tablet Place 1 tablet (0.4 mg total) under the tongue every 5 (five) minutes as needed for chest pain. 25 tablet 3   omeprazole (PRILOSEC) 10 MG capsule Take 2 capsules (20 mg total) by mouth daily. 60 capsule 6   Probiotic Product (PROBIOTIC DAILY PO) Take 1 capsule by mouth daily.     Propylene Glycol (SYSTANE BALANCE) 0.6 % SOLN Place 1 drop into both eyes 2 (two) times daily as needed (dry eyes).     No facility-administered medications prior to visit.       Objective:   Physical Exam:  General appearance: 81 y.o., female, NAD, conversant  Eyes: anicteric sclerae; PERRL, tracking appropriately HENT: NCAT; MMM Neck: Trachea midline; no lymphadenopathy, no JVD Lungs: CTAB, no crackles, no wheeze, with normal respiratory effort CV: RRR, no murmur  Abdomen: Soft, non-tender; non-distended, BS present  Extremities: No peripheral edema, warm Skin: Normal turgor and texture; no rash Psych: Appropriate affect Neuro: Alert and oriented to person and place, no focal deficit     There were no vitals filed for this visit.   on *** LPM *** RA BMI Readings from Last 3 Encounters:  05/15/22 42.36 kg/m  03/21/22 42.63 kg/m  02/05/22 42.73 kg/m   Wt Readings from Last 3 Encounters:  05/15/22 278 lb 9.6 oz (126.4 kg)  03/21/22 280 lb 6.4 oz (127.2 kg)  02/05/22 281 lb (127.5 kg)     CBC    Component Value Date/Time   WBC 6.1 12/21/2021 1216    RBC 4.72 12/21/2021 1216   HGB 13.9 01/03/2022 1317   HGB 14.7 07/07/2020 1112   HCT 41.0 01/03/2022 1317   HCT 44.4 07/07/2020 1112   PLT 193 12/21/2021 1216   PLT 234 07/07/2020 1112   MCV 93.9 12/21/2021 1216   MCV 89 07/07/2020 1112   MCH 29.9 12/21/2021 1216   MCHC 31.8 12/21/2021 1216   RDW 14.7 12/21/2021 1216   RDW 15.5 (H) 07/07/2020 1112   LYMPHSABS 1.5 11/28/2021 0339   MONOABS 1.0 11/28/2021 0339   EOSABS 0.0 11/28/2021 0339   BASOSABS 0.0 11/28/2021 0339    ***  Chest Imaging: 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, mosaicism, LLL scarring  Pulmonary Functions Testing Results:    Latest Ref Rng & Units 04/27/2022    1:35 PM 11/06/2019    8:19 AM  PFT Results  FVC-Pre L 2.09  2.31   FVC-Predicted Pre % 65  70   FVC-Post L 2.18  2.48   FVC-Predicted Post % 68  75   Pre FEV1/FVC % % 74  79   Post FEV1/FCV % % 78  77   FEV1-Pre L 1.55  1.82   FEV1-Predicted Pre % 64  73   FEV1-Post L 1.69  1.91   DLCO uncorrected ml/min/mmHg 14.05  20.26   DLCO UNC% % 65  93   DLVA Predicted % 85  112   TLC L 5.26  5.51   TLC % Predicted % 93  97   RV % Predicted % 115  112    PFT 04/27/22 reviewed by me with nonspecific ventilatory defect, mildly reduced diffusing capacity  FeNO: ***  Pathology: ***  Echocardiogram:    1. Left ventricular ejection fraction, by estimation, is 65 to 70%. The  left ventricle has normal function. The left  ventricle has no regional  wall motion abnormalities. There is moderate concentric left ventricular  hypertrophy. Left ventricular  diastolic function could not be evaluated.   2. Right ventricular systolic function is normal. The right ventricular  size is normal. Tricuspid regurgitation signal is inadequate for assessing  PA pressure.   3. Left atrial size was severely dilated.   4. No evidence of mitral valve regurgitation. Moderate mitral stenosis.  The mean mitral valve gradient is 10.0 mmHg. Severe mitral annular   calcification.   5. The aortic valve has been repaired/replaced. There is a 23 mm Sapien  prosthetic (TAVR) valve present in the aortic position. Echo findings are  consistent with normal structure and function of the aortic valve  prosthesis. Aortic valve mean gradient  measures 12.7 mmHg. Aortic valve Vmax measures 2.50 m/s.   6. The inferior vena cava is dilated in size with >50% respiratory  variability, suggesting right atrial pressure of 8 mmHg.   Heart Catheterization: ***    Assessment & Plan:    Plan:      Maryjane Hurter, MD [facility] 06/25/2022 2:41 PM

## 2022-06-26 ENCOUNTER — Encounter: Payer: Self-pay | Admitting: Student

## 2022-06-26 ENCOUNTER — Ambulatory Visit: Payer: Medicare Other | Admitting: Student

## 2022-06-26 VITALS — BP 116/74 | HR 57 | Temp 98.3°F

## 2022-06-26 DIAGNOSIS — U071 COVID-19: Secondary | ICD-10-CM

## 2022-06-26 MED ORDER — LEVALBUTEROL TARTRATE 45 MCG/ACT IN AERO: 1.0000 | INHALATION_SPRAY | Freq: Four times a day (QID) | RESPIRATORY_TRACT | 12 refills | Status: DC | PRN

## 2022-06-26 NOTE — Patient Instructions (Addendum)
-   spiriva 1 puff once daily. If you tolerate well without feeling too jittery or having palpitations, heart racing then increase to spiriva 2 puffs once daily - xopenex 1-2 puffs as needed - this is a rescue inhaler only to be used as needed, ideally 1-2x per month or so - see you in a month!

## 2022-07-17 ENCOUNTER — Other Ambulatory Visit: Payer: Self-pay | Admitting: Internal Medicine

## 2022-07-25 NOTE — Progress Notes (Signed)
Synopsis: Referred for dyspnea by Charlane Ferretti, DO  Subjective:   PATIENT ID: Victoria Delgado GENDER: female DOB: 1941-07-30, MRN: [id number]  No chief complaint on file.  80yF with history of CAD, AF, asymmetric septal hypertrophy, GERD/hiatal hernia, OSA on CPAP - followed by Dr. Tresa Endo, moderate MR, AS s/p TAVR, former smoker, covid-19 infection referred for dyspnea  She has been in AF since she had covid-19. She herself had some reservations about using amio due to her breathing issues and she has actually come off of it in mid-June. Some days are worse than others with her DOE. Can't pinpoint trigger. She has never tried albuterol inhaler due to her concern about worsening her afib. She hasn't had a recent course of steroids for DOE.   Also has occasional cough productive of clearish sputum, worse in AM.   She smoked 1-1.5ppd for 20 years. Quit 1990.   Interval HPI: Started with spiriva 1 puff once daily, increased to 2 puffs once daily. She does think it has probably been helpful for DOE. Has not tried rescue inhaler. Minor cough currently, better than at last visit.   Has not needed course of steroids or ABX since last visit.  Otherwise pertinent review of systems is negative.  Past Medical History:  Diagnosis Date   Abdominal pain    Arthritis    osteoarthritis-hips,knees, back, hands   Asymmetric septal hypertrophy (HCC)    Atrial fibrillation (HCC)    CAD (coronary artery disease)    2 drug-eluting stents placed in the circumflex leading into a marginal.  May 2017.  [name].   catheterization on 03/19/2018, this showed patent stent in the proximal to mid left circumflex artery, 20% ostial left circumflex artery disease, 40% OM 3 disease, widely patent stent in the ostial D1, 20% ostial to proximal LAD disease, 20% mid LAD disease.   Cataract    corrective surgery done   Fatty liver    Gallstones    GERD (gastroesophageal reflux disease)    Hepatitis    hepatitis  A; past hx.,many yrs ago ? food source   Hernia, hiatal    Hyperlipidemia    Hypertension    Obesity    Sleep apnea    CPAP     Family History  Problem Relation Age of Onset   Hypertension Mother    CVA Mother    Emphysema Mother        smoked   Lung cancer Father 4       asbestos exposure   Cancer - Lung Father        smoked and asbestos exp   Emphysema Sister        smoked   Esophageal cancer Other        nephew   Diabetes Other        nephew   Colon cancer Neg Hx    Pancreatic cancer Neg Hx    Kidney disease Neg Hx    Liver disease Neg Hx      Past Surgical History:  Procedure Laterality Date   APPENDECTOMY  2004   CARDIOVERSION N/A 01/03/2022   Procedure: CARDIOVERSION;  Surgeon: Sande Rives, MD;  Location: [facility] ENDOSCOPY;  Service: Cardiovascular;  Laterality: N/A;   CATARACT EXTRACTION W/ INTRAOCULAR LENS IMPLANT     both eyes   CHOLECYSTECTOMY     LEFT HEART CATH AND CORONARY ANGIOGRAPHY N/A 03/19/2018   Procedure: LEFT HEART CATH AND CORONARY ANGIOGRAPHY;  Surgeon: Kathleene Hazel, MD;  Location: [facility] INVASIVE CV LAB;  Service: Cardiovascular;  Laterality: N/A;   NM MYOCAR PERF WALL MOTION  08/15/04   No ischemia   RETINAL DETACHMENT SURGERY Bilateral    remains with slight hazy vision with lights   RIGHT/LEFT HEART CATH AND CORONARY ANGIOGRAPHY N/A 07/11/2020   Procedure: RIGHT/LEFT HEART CATH AND CORONARY ANGIOGRAPHY;  Surgeon: Tonny Bollman, MD;  Location: [facility] INVASIVE CV LAB;  Service: Cardiovascular;  Laterality: N/A;   TOTAL HIP ARTHROPLASTY Right 08/04/2014   Procedure: RIGHT TOTAL HIP ARTHROPLASTY ANTERIOR APPROACH;  Surgeon: Loanne Drilling, MD;  Location: [facility] ORS;  Service: Orthopedics;  Laterality: Right;   TOTAL HIP ARTHROPLASTY Left 01/05/2015   Procedure: LEFT TOTAL HIP ARTHROPLASTY ANTERIOR APPROACH;  Surgeon: Loanne Drilling, MD;  Location: [facility] ORS;  Service: Orthopedics;  Laterality: Left;   US ECHOCARDIOGRAPHY  06/25/2012   Moderate  ASH,LV hyperdynamic,LA is mod. dilated,trace MR,TR,AI    Social History   Socioeconomic History   Marital status: Married    Spouse name: Not on file   Number of children: 2   Years of education: Not on file   Highest education level: Not on file  Occupational History    Employer: OTHER  Tobacco Use   Smoking status: Former    Packs/day: 1.00    Years: 20.00    Total pack years: 20.00    Types: Cigarettes    Quit date: 12/03/1988    Years since quitting: 33.6   Smokeless tobacco: Never  Vaping Use   Vaping Use: Never used  Substance and Sexual Activity   Alcohol use: No    Alcohol/week: 0.0 standard drinks of alcohol   Drug use: No   Sexual activity: Yes  Other Topics Concern   Not on file  Social History Narrative   Two grandchildren.            Social Determinants of Health   Financial Resource Strain: Not on file  Food Insecurity: Not on file  Transportation Needs: Not on file  Physical Activity: Not on file  Stress: Not on file  Social Connections: Not on file  Intimate Partner Violence: Not on file     Allergies  Allergen Reactions   Codeine Nausea Only   Kenalog [Triamcinolone Acetonide] Other (See Comments)    Had A.fib after she received the injection."Steroid meds"   Kenalog [Triamcinolone] Rash    Other reaction(s): AFIB   Pantoprazole Rash    Other reaction(s): chronic upper GI pain all the way to back   Relafen [Nabumetone] Other (See Comments)    Edema    Epinephrine Palpitations   Penicillins Rash    40 years ago, injection site was red and swollen.  rash, facial/tongue/throat swelling, SOB or lightheadedness with hypotension: Yes Has patient had a PCN reaction causing immediate  Has patient had a PCN reaction causing severe rash involving mucus membranes or skin necrosis: NO Has patient had a PCN reaction that required hospitalization. No  Has patient had a PCN reaction occurring within the last 10 years: No If all of the above answers  are "NO", then may proceed with Cephalosporin use.     Tramadol Other (See Comments)    Auditory halllucinations     Outpatient Medications Prior to Visit  Medication Sig Dispense Refill   acetaminophen (TYLENOL) 500 MG tablet Take 1,000 mg by mouth every 6 (six) hours as needed for moderate pain or headache.     alum & mag hydroxide-simeth (GELUSIL) 200-200-25 MG chewable tablet  Taking 2-4 chewables by mouth daily     azithromycin (ZITHROMAX) 500 MG tablet Take 30-60 min prior to dental procedures. (Patient not taking: Reported on 06/26/2022) 1 tablet 2   cholecalciferol (VITAMIN D3) 25 MCG (1000 UNIT) tablet Take 1,000 Units by mouth 2 (two) times daily.     Coenzyme Q10 (COQ10) 100 MG CAPS Take 100 mg by mouth daily.     CRESTOR 40 MG tablet Take 1 tablet (40 mg total) by mouth daily. 90 tablet 3   ELIQUIS 5 MG TABS tablet TAKE 1 TABLET(5 MG) BY MOUTH TWICE DAILY 60 tablet 5   ezetimibe (ZETIA) 10 MG tablet Take 1 tablet (10 mg total) by mouth daily. 90 tablet 1   furosemide (LASIX) 40 MG tablet TAKE 1 TABLET(40 MG) BY MOUTH DAILY 90 tablet 3   levalbuterol (XOPENEX HFA) 45 MCG/ACT inhaler Inhale 1-2 puffs into the lungs every 6 (six) hours as needed for wheezing. 1 each 12   loteprednol (LOTEMAX) 0.5 % ophthalmic suspension Place 1 drop into both eyes daily as needed (imflammation).     metoprolol succinate (TOPROL XL) 50 MG 24 hr tablet Take 1 tablet (50 mg total) by mouth in the morning and at bedtime. 180 tablet 3   Multiple Vitamin (MULTIVITAMIN) tablet Take 1 tablet by mouth 2 (two) times a week.     nitroGLYCERIN (NITROSTAT) 0.4 MG SL tablet Place 1 tablet (0.4 mg total) under the tongue every 5 (five) minutes as needed for chest pain. 25 tablet 3   omeprazole (PRILOSEC) 10 MG capsule TAKE 2 CAPSULES(20 MG) BY MOUTH DAILY 60 capsule 6   Probiotic Product (PROBIOTIC DAILY PO) Take 1 capsule by mouth daily.     Propylene Glycol (SYSTANE BALANCE) 0.6 % SOLN Place 1 drop into both eyes 2  (two) times daily as needed (dry eyes).     No facility-administered medications prior to visit.       Objective:   Physical Exam:  General appearance: 81 y.o., female, NAD, conversant  Eyes: anicteric sclerae; PERRL, tracking appropriately HENT: NCAT; MMM Neck: Trachea midline; no lymphadenopathy, no JVD Lungs: CTAB, no crackles, no wheeze, with normal respiratory effort CV: RRR, no murmur  Abdomen: Soft, non-tender; non-distended, BS present  Extremities: No peripheral edema, warm Skin: Normal turgor and texture; no rash Psych: Appropriate affect Neuro: Alert and oriented to person and place, no focal deficit     There were no vitals filed for this visit.    on RA BMI Readings from Last 3 Encounters:  05/15/22 42.36 kg/m  03/21/22 42.63 kg/m  02/05/22 42.73 kg/m   Wt Readings from Last 3 Encounters:  05/15/22 278 lb 9.6 oz (126.4 kg)  03/21/22 280 lb 6.4 oz (127.2 kg)  02/05/22 281 lb (127.5 kg)     CBC    Component Value Date/Time   WBC 6.1 12/21/2021 1216   RBC 4.72 12/21/2021 1216   HGB 13.9 01/03/2022 1317   HGB 14.7 07/07/2020 1112   HCT 41.0 01/03/2022 1317   HCT 44.4 07/07/2020 1112   PLT 193 12/21/2021 1216   PLT 234 07/07/2020 1112   MCV 93.9 12/21/2021 1216   MCV 89 07/07/2020 1112   MCH 29.9 12/21/2021 1216   MCHC 31.8 12/21/2021 1216   RDW 14.7 12/21/2021 1216   RDW 15.5 (H) 07/07/2020 1112   LYMPHSABS 1.5 11/28/2021 0339   MONOABS 1.0 11/28/2021 0339   EOSABS 0.0 11/28/2021 0339   BASOSABS 0.0 11/28/2021 0339     Chest  Imaging: 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, mosaicism, LLL scarring  Pulmonary Functions Testing Results:    Latest Ref Rng & Units 04/27/2022    1:35 PM 11/06/2019    8:19 AM  PFT Results  FVC-Pre L 2.09  2.31   FVC-Predicted Pre % 65  70   FVC-Post L 2.18  2.48   FVC-Predicted Post % 68  75   Pre FEV1/FVC % % 74  79   Post FEV1/FCV % % 78  77   FEV1-Pre L 1.55  1.82   FEV1-Predicted Pre % 64   73   FEV1-Post L 1.69  1.91   DLCO uncorrected ml/min/mmHg 14.05  20.26   DLCO UNC% % 65  93   DLVA Predicted % 85  112   TLC L 5.26  5.51   TLC % Predicted % 93  97   RV % Predicted % 115  112    PFT 04/27/22 reviewed by me with nonspecific ventilatory defect, mildly reduced diffusing capacity    Echocardiogram:    1. Left ventricular ejection fraction, by estimation, is 65 to 70%. The  left ventricle has normal function. The left ventricle has no regional  wall motion abnormalities. There is moderate concentric left ventricular  hypertrophy. Left ventricular  diastolic function could not be evaluated.   2. Right ventricular systolic function is normal. The right ventricular  size is normal. Tricuspid regurgitation signal is inadequate for assessing  PA pressure.   3. Left atrial size was severely dilated.   4. No evidence of mitral valve regurgitation. Moderate mitral stenosis.  The mean mitral valve gradient is 10.0 mmHg. Severe mitral annular  calcification.   5. The aortic valve has been repaired/replaced. There is a 23 mm Sapien  prosthetic (TAVR) valve present in the aortic position. Echo findings are  consistent with normal structure and function of the aortic valve  prosthesis. Aortic valve mean gradient  measures 12.7 mmHg. Aortic valve Vmax measures 2.50 m/s.   6. The inferior vena cava is dilated in size with >50% respiratory  variability, suggesting right atrial pressure of 8 mmHg.       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 Although she doesn't have obstruction on PFT and her air trapping on HRCT could just be due to obesity, she does have emphysema and it's probably worth a trial of a controller inhaler.  Her pattern of emphysema is interesting and almost looks like LAM but is probably just due to smoking and with her relatively limited symptoms I'm not sure how much value testing for VEGF-D would confer over and above a good controller inhaler. I don't see any clear evidence of amiodarone related lung injury. She is apprehensive about any inhaler that may worsen her control of AF but has tolerated low dose LAMA well to decent effect wrt cough, dyspnea.  Plan: - Continue spiriva 1.25 2 puffs daily - I'll clarify what inhaler from same class will be least expensive going forward - xopenex 1-2 puffs as needed is your rescue inhaler - see you in 3 months or sooner if need be!     Omar Person, MD  Pulmonary Critical Care 07/25/2022 4:01 PM

## 2022-07-27 ENCOUNTER — Ambulatory Visit: Payer: Medicare Other | Admitting: Student

## 2022-07-27 ENCOUNTER — Telehealth: Payer: Self-pay | Admitting: Student

## 2022-07-27 ENCOUNTER — Other Ambulatory Visit: Payer: Self-pay | Admitting: Internal Medicine

## 2022-07-27 ENCOUNTER — Other Ambulatory Visit (HOSPITAL_COMMUNITY): Payer: Self-pay

## 2022-07-27 ENCOUNTER — Encounter: Payer: Self-pay | Admitting: Student

## 2022-07-27 ENCOUNTER — Telehealth (HOSPITAL_COMMUNITY): Payer: Self-pay

## 2022-07-27 VITALS — BP 128/76 | HR 68 | Temp 98.3°F

## 2022-07-27 DIAGNOSIS — R0609 Other forms of dyspnea: Secondary | ICD-10-CM

## 2022-07-27 DIAGNOSIS — J438 Other emphysema: Secondary | ICD-10-CM

## 2022-07-27 DIAGNOSIS — R053 Chronic cough: Secondary | ICD-10-CM

## 2022-07-27 MED ORDER — SPIRIVA RESPIMAT 1.25 MCG/ACT IN AERS: 2.0000 | INHALATION_SPRAY | Freq: Every day | RESPIRATORY_TRACT | 0 refills | Status: DC

## 2022-07-27 NOTE — Telephone Encounter (Signed)
What is least expensive lama for her? And how much would spiriva respimat cost?  Thanks!

## 2022-07-27 NOTE — Telephone Encounter (Signed)
Pt called and stated that she didn't want to be around a lot of people and wanted to cancel her pulmonary rehab, and close her referral. Closed referral.

## 2022-07-27 NOTE — Patient Instructions (Addendum)
-   Continue spiriva 1.25 2 puffs daily - I'll clarify what inhaler from same class will be least expensive going forward - xopenex 1-2 puffs as needed is your rescue inhaler - see you in 3 months or sooner if need be!

## 2022-07-30 ENCOUNTER — Ambulatory Visit (HOSPITAL_COMMUNITY): Payer: Medicare Other

## 2022-07-31 ENCOUNTER — Encounter: Payer: Self-pay | Admitting: Cardiology

## 2022-08-07 ENCOUNTER — Ambulatory Visit (HOSPITAL_COMMUNITY): Payer: Medicare Other

## 2022-08-09 ENCOUNTER — Ambulatory Visit (HOSPITAL_COMMUNITY): Payer: Medicare Other

## 2022-08-10 MED ORDER — SPIRIVA RESPIMAT 1.25 MCG/ACT IN AERS: 2.0000 | INHALATION_SPRAY | Freq: Every day | RESPIRATORY_TRACT | 5 refills | Status: DC

## 2022-08-10 NOTE — Telephone Encounter (Signed)
Patient states that she did research and would like a prescription for Spiriva respimat sent to [facility] on [address] in [place]. Patient is almost out of samples she received so needs to filled soon.  Please advise.

## 2022-08-10 NOTE — Telephone Encounter (Signed)
Rx for Spiriva 1.25 has been sent to pharmacy for pt. Called and spoke with pt letting her know this had been done and she verbalized understanding. Nothing further needed.

## 2022-08-14 ENCOUNTER — Ambulatory Visit (HOSPITAL_COMMUNITY): Payer: Medicare Other

## 2022-08-16 ENCOUNTER — Ambulatory Visit (HOSPITAL_COMMUNITY): Payer: Medicare Other

## 2022-08-21 ENCOUNTER — Ambulatory Visit (HOSPITAL_COMMUNITY): Payer: Medicare Other

## 2022-08-23 ENCOUNTER — Ambulatory Visit (HOSPITAL_COMMUNITY): Payer: Medicare Other

## 2022-08-28 ENCOUNTER — Ambulatory Visit (HOSPITAL_COMMUNITY): Payer: Medicare Other

## 2022-08-30 ENCOUNTER — Ambulatory Visit (HOSPITAL_COMMUNITY): Payer: Medicare Other

## 2022-09-04 ENCOUNTER — Ambulatory Visit (HOSPITAL_COMMUNITY): Payer: Medicare Other

## 2022-09-06 ENCOUNTER — Ambulatory Visit (HOSPITAL_COMMUNITY): Payer: Medicare Other

## 2022-09-11 ENCOUNTER — Ambulatory Visit (HOSPITAL_COMMUNITY): Payer: Medicare Other

## 2022-09-13 ENCOUNTER — Ambulatory Visit (HOSPITAL_COMMUNITY): Payer: Medicare Other

## 2022-09-15 ENCOUNTER — Other Ambulatory Visit: Payer: Self-pay | Admitting: Cardiology

## 2022-09-18 ENCOUNTER — Ambulatory Visit (HOSPITAL_COMMUNITY): Payer: Medicare Other

## 2022-09-20 ENCOUNTER — Encounter: Payer: Self-pay | Admitting: *Deleted

## 2022-09-20 ENCOUNTER — Ambulatory Visit (HOSPITAL_COMMUNITY): Payer: Medicare Other

## 2022-09-25 ENCOUNTER — Ambulatory Visit (HOSPITAL_COMMUNITY): Payer: Medicare Other

## 2022-09-27 ENCOUNTER — Ambulatory Visit (HOSPITAL_COMMUNITY): Payer: Medicare Other

## 2022-09-28 ENCOUNTER — Encounter: Payer: Self-pay | Admitting: Cardiology

## 2022-10-02 ENCOUNTER — Ambulatory Visit (HOSPITAL_COMMUNITY): Payer: Medicare Other

## 2022-10-04 ENCOUNTER — Ambulatory Visit (HOSPITAL_COMMUNITY): Payer: Medicare Other

## 2022-10-09 ENCOUNTER — Ambulatory Visit (HOSPITAL_COMMUNITY): Payer: Medicare Other

## 2022-11-01 ENCOUNTER — Ambulatory Visit: Payer: Medicare Other | Admitting: Student

## 2022-11-05 ENCOUNTER — Ambulatory Visit (HOSPITAL_COMMUNITY): Payer: Medicare Other | Attending: Cardiology

## 2022-11-05 DIAGNOSIS — I517 Cardiomegaly: Secondary | ICD-10-CM | POA: Diagnosis not present

## 2022-11-05 DIAGNOSIS — I480 Paroxysmal atrial fibrillation: Secondary | ICD-10-CM | POA: Insufficient documentation

## 2022-11-05 DIAGNOSIS — Z952 Presence of prosthetic heart valve: Secondary | ICD-10-CM | POA: Insufficient documentation

## 2022-11-05 DIAGNOSIS — I2510 Atherosclerotic heart disease of native coronary artery without angina pectoris: Secondary | ICD-10-CM | POA: Diagnosis not present

## 2022-11-05 LAB — ECHOCARDIOGRAM COMPLETE
AR max vel: 1.8 cm2
AV Area VTI: 1.97 cm2
AV Area mean vel: 1.99 cm2
AV Mean grad: 16 mmHg
AV Peak grad: 37 mmHg
Ao pk vel: 3.04 m/s
Area-P 1/2: 1.84 cm2
S' Lateral: 2.5 cm

## 2022-11-08 NOTE — Progress Notes (Signed)
Cardiology Office Note   Date:  11/09/2022   ID:  Victoria Delgado, DOB 07-23-1941, MRN [id number]  PCP:  Charlane Ferretti, DO  Cardiologist:   Rollene Rotunda, MD   Chief Complaint  Patient presents with   Shortness of Breath      History of Present Illness: Victoria Delgado is a 81 y.o. female who presents for follow up of CAD.  She has atrial fibrillation which she ultimately thinks was related to a steroid injection. This was in 2008. At that time she was found to have asymmetric septal hypertrophy. This has been followed conservatively.  Echocardiogram did demonstrate concentric LV hypertrophy which was moderate and moderate LAE.  In May of 2017 she was in the hospital with CAD.  She was admitted to [facility] on 5/24 after waking up with left arm pain.  She had positive troponin with  PCI to the mid LCX and 1st DX. She is on DAPT with aspirin/Effient.  She had 2 drug-eluting stents placed in the circumflex leading into a marginal. This was apparently somewhat small circumflex. She also had a diagonal had a drug-eluting stent placed.  An echo in our office showed well preserved ejection fraction. There was some moderate aortic stenosis. There was no mention of hypertrophic obstruction although there is LVH.  She was in the hospital in April 2019 with chest pain.    She did have positive cardiac enzymes.   She underwent cardiac catheterization on 03/19/2018, this showed patent stent in the proximal to mid left circumflex artery, 20% ostial left circumflex artery disease, 40% OM 3 disease, widely patent stent in the ostial D1, 20% ostial to proximal LAD disease, 20% mid LAD disease.  Cardiac catheterization did confirm moderate aortic stenosis with mean gradient of 26.7 mmHg.  Medical therapy was recommended for her coronary artery disease.  She had a follow up echo and her AS was severe to critical.  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  (23 mm Edwards Sapien S3 Ultra).  She was in the hospital and  December with COVID.  She had atrial fibrillation with rapid ventricular response.  She was treated with IV amiodarone and sent home on oral amiodarone.  She was treated with anticoagulation.   Of note she had normal TAVR gradients with moderate mitral stenosis on her echo at the time of her admission in December. She had a urinary infection and was in the ER for this.  I do note that she had elevated troponin but this was thought to be demand ischemia. She had cardioversion on February 1.    She called recently about her statin dose.  She could not tolerate the higher dose and so we went back down.  She also is complaining of shortness of breath.  This has been progressive.  She is short of breath walking a short distance on level ground.  We did walk around the office today and she was breathless although her sats stayed in the mid to low 90s.  She is not having any new chest pressure, neck or arm discomfort.  She is not having any palpitations and her device demonstrates no atrial fibrillation.  Her wearable does suggest some nighttime hypoxemia but she has a CPAP.  She is not having any new PND or orthopnea.  She has not had cough fevers or chills.  She had an echo earlier this month.  This demonstrated that severe concentric left ventricular hypertrophy with an EF of 70%.  There  is an outflow gradient of 86 with no SAM and no change of Valsalva.  She has a heavily calcified mitral annulus.  There is a 13 mm gradient across the mitral valve and is estimated to be 1.1 cm.  There is no significant regurgitation.  Her TAVR seems to be functioning normally.   Past Medical History:  Diagnosis Date   Abdominal pain    Arthritis    osteoarthritis-hips,knees, back, hands   Asymmetric septal hypertrophy (HCC)    Atrial fibrillation (HCC)    CAD (coronary artery disease)    2 drug-eluting stents placed in the circumflex leading into a marginal.  May 2017.  [name].   catheterization on 03/19/2018, this  showed patent stent in the proximal to mid left circumflex artery, 20% ostial left circumflex artery disease, 40% OM 3 disease, widely patent stent in the ostial D1, 20% ostial to proximal LAD disease, 20% mid LAD disease.   Cataract    corrective surgery done   Fatty liver    Gallstones    GERD (gastroesophageal reflux disease)    Hepatitis    hepatitis A; past hx.,many yrs ago ? food source   Hernia, hiatal    Hyperlipidemia    Hypertension    Obesity    Sleep apnea    CPAP    Past Surgical History:  Procedure Laterality Date   APPENDECTOMY  2004   CARDIOVERSION N/A 01/03/2022   Procedure: CARDIOVERSION;  Surgeon: Sande Rives, MD;  Location: [facility] ENDOSCOPY;  Service: Cardiovascular;  Laterality: N/A;   CATARACT EXTRACTION W/ INTRAOCULAR LENS IMPLANT     both eyes   CHOLECYSTECTOMY     LEFT HEART CATH AND CORONARY ANGIOGRAPHY N/A 03/19/2018   Procedure: LEFT HEART CATH AND CORONARY ANGIOGRAPHY;  Surgeon: Kathleene Hazel, MD;  Location: [facility] INVASIVE CV LAB;  Service: Cardiovascular;  Laterality: N/A;   NM MYOCAR PERF WALL MOTION  08/15/04   No ischemia   RETINAL DETACHMENT SURGERY Bilateral    remains with slight hazy vision with lights   RIGHT/LEFT HEART CATH AND CORONARY ANGIOGRAPHY N/A 07/11/2020   Procedure: RIGHT/LEFT HEART CATH AND CORONARY ANGIOGRAPHY;  Surgeon: Tonny Bollman, MD;  Location: [facility] INVASIVE CV LAB;  Service: Cardiovascular;  Laterality: N/A;   TOTAL HIP ARTHROPLASTY Right 08/04/2014   Procedure: RIGHT TOTAL HIP ARTHROPLASTY ANTERIOR APPROACH;  Surgeon: Loanne Drilling, MD;  Location: [facility] ORS;  Service: Orthopedics;  Laterality: Right;   TOTAL HIP ARTHROPLASTY Left 01/05/2015   Procedure: LEFT TOTAL HIP ARTHROPLASTY ANTERIOR APPROACH;  Surgeon: Loanne Drilling, MD;  Location: [facility] ORS;  Service: Orthopedics;  Laterality: Left;   US ECHOCARDIOGRAPHY  06/25/2012   Moderate ASH,LV hyperdynamic,LA is mod. dilated,trace MR,TR,AI     Current Outpatient  Medications  Medication Sig Dispense Refill   acetaminophen (TYLENOL) 500 MG tablet Take 1,000 mg by mouth every 6 (six) hours as needed for moderate pain or headache.     alum & mag hydroxide-simeth (GELUSIL) 200-200-25 MG chewable tablet Taking 2-4 chewables by mouth daily     azithromycin (ZITHROMAX) 500 MG tablet Take 30-60 min prior to dental procedures. 1 tablet 2   cholecalciferol (VITAMIN D3) 25 MCG (1000 UNIT) tablet Take 1,000 Units by mouth 2 (two) times daily.     Coenzyme Q10 (COQ10) 100 MG CAPS Take 100 mg by mouth daily.     ELIQUIS 5 MG TABS tablet TAKE 1 TABLET(5 MG) BY MOUTH TWICE DAILY 60 tablet 5   ezetimibe (ZETIA) 10 MG  tablet TAKE 1 TABLET(10 MG) BY MOUTH DAILY 90 tablet 1   furosemide (LASIX) 40 MG tablet TAKE 1 TABLET(40 MG) BY MOUTH DAILY 90 tablet 3   levalbuterol (XOPENEX HFA) 45 MCG/ACT inhaler Inhale 1-2 puffs into the lungs every 6 (six) hours as needed for wheezing. 1 each 12   loteprednol (LOTEMAX) 0.5 % ophthalmic suspension Place 1 drop into both eyes daily as needed (imflammation).     metoprolol succinate (TOPROL XL) 50 MG 24 hr tablet Take 1 tablet (50 mg total) by mouth in the morning and at bedtime. 180 tablet 3   Multiple Vitamin (MULTIVITAMIN) tablet Take 1 tablet by mouth 2 (two) times a week.     nitroGLYCERIN (NITROSTAT) 0.4 MG SL tablet Place 1 tablet (0.4 mg total) under the tongue every 5 (five) minutes as needed for chest pain. 25 tablet 3   omeprazole (PRILOSEC) 10 MG capsule TAKE 2 CAPSULES(20 MG) BY MOUTH DAILY (Patient taking differently: Take 10 mg by mouth daily.) 60 capsule 6   Probiotic Product (PROBIOTIC DAILY PO) Take 1 capsule by mouth daily.     Propylene Glycol (SYSTANE BALANCE) 0.6 % SOLN Place 1 drop into both eyes 2 (two) times daily as needed (dry eyes).     rosuvastatin (CRESTOR) 20 MG tablet Take 20 mg by mouth daily.     Tiotropium Bromide Monohydrate (SPIRIVA RESPIMAT) 1.25 MCG/ACT AERS Inhale 2 puffs into the lungs daily. 4 g 5    No current facility-administered medications for this visit.    Allergies:   Codeine, Kenalog [triamcinolone acetonide], Kenalog [triamcinolone], Pantoprazole, Relafen [nabumetone], Epinephrine, Penicillins, and Tramadol    ROS:  Please see the history of present illness.   Otherwise, review of systems are positive for none.   All other systems are reviewed and negative.    PHYSICAL EXAM: VS:  BP 112/60 (BP Location: Left Arm, Patient Position: Sitting, Cuff Size: Large)   Pulse 68   Ht 5\' 8"  (1.727 m)   Wt 278 lb (126.1 kg)   BMI 42.27 kg/m  , BMI Body mass index is 42.27 kg/m. GENERAL:  Well appearing NECK:  No jugular venous distention, waveform within normal limits, carotid upstroke brisk and symmetric, no bruits, no thyromegaly LUNGS:  Clear to auscultation bilaterally CHEST:  Unremarkable HEART:  PMI not displaced or sustained,S1 and S2 within normal limits, no S3, no S4, no clicks, no rubs, 2 out of 6 apical systolic murmur radiating slightly at the aortic outflow tract, no diastolic murmurs ABD:  Flat, positive bowel sounds normal in frequency in pitch, no bruits, no rebound, no guarding, no midline pulsatile mass, no hepatomegaly, no splenomegaly EXT:  2 plus pulses throughout, no edema, no cyanosis no clubbing  EKG:  EKG is not ordered today. NA  Recent Labs: 11/24/2021: B Natriuretic Peptide 609.0 11/28/2021: ALT 24; Magnesium 2.2 12/21/2021: Platelets 193 01/03/2022: Hemoglobin 13.9 02/05/2022: BUN 17; Creatinine, Ser 0.99; Potassium 4.9; Sodium 143; TSH 4.440    Lipid Panel    Component Value Date/Time   CHOL 162 05/15/2022 1639   TRIG 184 (H) 05/15/2022 1639   HDL 42 05/15/2022 1639   CHOLHDL 3.9 05/15/2022 1639   CHOLHDL 5.1 03/17/2018 0034   VLDL 37 03/17/2018 0034   LDLCALC 88 05/15/2022 1639      Wt Readings from Last 3 Encounters:  11/09/22 278 lb (126.1 kg)  05/15/22 278 lb 9.6 oz (126.4 kg)  03/21/22 280 lb 6.4 oz (127.2 kg)      Other  studies Reviewed: Additional studies/ records that were reviewed today include: echo  Review of the above records demonstrates:  Please see elsewhere in the note.     ASSESSMENT AND PLAN:  ATRIAL FIB:   She has Ms. ZNYA ALBINO has a CHA2DS2 - VASc score 5.  She has had no burden of this.  No change in therapy.   CAD:  I would not strongly suspect an ischemic etiology to her shortness of breath and she will continue with risk reduction.   Sleep apnea - She is having some trouble with her insurance device.  I have team talked to her today about this.   LVH- This was severe.  I am going to check a BNP level which was only mildly elevated last year.  She may well have hypertrophic cardiomyopathy as etiology and at this point she is on negative chronotropic's.  I do not get the sense that she is volume overloaded.   I am not sure that her heart rate and blood pressure tolerate the addition of the calcium channel blocker.  She might be a candidate for mavacamten but I probably would start with an MRI.  For now I am going to have her lose 10 pounds if possible to see if her breathing gets any better.  If not she will have an MRI.  AS - She understands endocarditis prophylaxis.   The valve seems to be functioning normally.   MS - This was previously moderate.  I do not suspect this to be the culprit for her dyspnea but will consider right heart cath when I see her back.   Dyslipidemia - LDL was 59 with an HDL of 36.  No change in therapy.   Hypertension  The blood pressure is controlled as above.    Current medicines are reviewed at length with the patient today.  The patient does not have concerns regarding medicines.  The following changes have been made: None  Labs/ tests ordered today include:    Orders Placed This Encounter  Procedures   Brain natriuretic peptide     Disposition:   FU with me in 3 months.    Signed, Rollene Rotunda, MD  11/09/2022 1:16 PM    [facility]

## 2022-11-09 ENCOUNTER — Encounter: Payer: Self-pay | Admitting: Cardiology

## 2022-11-09 ENCOUNTER — Ambulatory Visit: Payer: Medicare Other | Attending: Cardiology | Admitting: Cardiology

## 2022-11-09 VITALS — BP 112/60 | HR 68 | Ht 68.0 in | Wt 278.0 lb

## 2022-11-09 DIAGNOSIS — I4819 Other persistent atrial fibrillation: Secondary | ICD-10-CM

## 2022-11-09 DIAGNOSIS — I2510 Atherosclerotic heart disease of native coronary artery without angina pectoris: Secondary | ICD-10-CM | POA: Diagnosis not present

## 2022-11-09 DIAGNOSIS — E785 Hyperlipidemia, unspecified: Secondary | ICD-10-CM

## 2022-11-09 DIAGNOSIS — I10 Essential (primary) hypertension: Secondary | ICD-10-CM

## 2022-11-09 DIAGNOSIS — I517 Cardiomegaly: Secondary | ICD-10-CM | POA: Diagnosis not present

## 2022-11-09 DIAGNOSIS — R0602 Shortness of breath: Secondary | ICD-10-CM

## 2022-11-09 NOTE — Patient Instructions (Signed)
Medication Instructions:   Your physician recommends that you continue on your current medications as directed. Please refer to the Current Medication list given to you today.  *If you need a refill on your cardiac medications before your next appointment, please call your pharmacy*  Lab Work: Your physician recommends that you return for lab work TODAY:  BNP   If you have labs (blood work) drawn today and your tests are completely normal, you will receive your results only by: MyChart Message (if you have MyChart) OR A paper copy in the mail If you have any lab test that is abnormal or we need to change your treatment, we will call you to review the results.  Testing/Procedures: NONE ordered at this time of appointment   Follow-Up: At [facility], you and your health needs are our priority.  As part of our continuing mission to provide you with exceptional heart care, we have created designated Provider Care Teams.  These Care Teams include your primary Cardiologist (physician) and Advanced Practice Providers (APPs -  Physician Assistants and Nurse Practitioners) who all work together to provide you with the care you need, when you need it.  Your next appointment:   3 month(s)  The format for your next appointment:   In Person  Provider:   Rollene Rotunda, MD     Other Instructions  Important Information About Sugar

## 2022-11-10 LAB — BRAIN NATRIURETIC PEPTIDE: BNP: 115.5 pg/mL — ABNORMAL HIGH (ref 0.0–100.0)

## 2022-12-02 ENCOUNTER — Other Ambulatory Visit: Payer: Self-pay | Admitting: Cardiology

## 2022-12-02 DIAGNOSIS — I480 Paroxysmal atrial fibrillation: Secondary | ICD-10-CM

## 2022-12-04 NOTE — Telephone Encounter (Signed)
Prescription refill request for Eliquis received. Indication:afib Last office visit:12/23 Scr:0.9 Age: 82 Weight:126.1  kg  Prescription refilled

## 2022-12-05 ENCOUNTER — Inpatient Hospital Stay (HOSPITAL_COMMUNITY)
Admission: EM | Admit: 2022-12-05 | Discharge: 2022-12-13 | DRG: 202 | Disposition: A | Discharge status: Home / Self Care | Payer: Medicare Other | Attending: Internal Medicine | Admitting: Internal Medicine

## 2022-12-05 ENCOUNTER — Emergency Department (HOSPITAL_COMMUNITY): Payer: Medicare Other

## 2022-12-05 DIAGNOSIS — Z87891 Personal history of nicotine dependence: Secondary | ICD-10-CM

## 2022-12-05 DIAGNOSIS — Z886 Allergy status to analgesic agent status: Secondary | ICD-10-CM

## 2022-12-05 DIAGNOSIS — Z6841 Body Mass Index (BMI) 40.0 and over, adult: Secondary | ICD-10-CM

## 2022-12-05 DIAGNOSIS — M7981 Nontraumatic hematoma of soft tissue: Secondary | ICD-10-CM | POA: Diagnosis not present

## 2022-12-05 DIAGNOSIS — Z9049 Acquired absence of other specified parts of digestive tract: Secondary | ICD-10-CM

## 2022-12-05 DIAGNOSIS — Z7951 Long term (current) use of inhaled steroids: Secondary | ICD-10-CM

## 2022-12-05 DIAGNOSIS — Z833 Family history of diabetes mellitus: Secondary | ICD-10-CM

## 2022-12-05 DIAGNOSIS — Z8249 Family history of ischemic heart disease and other diseases of the circulatory system: Secondary | ICD-10-CM

## 2022-12-05 DIAGNOSIS — Z800 Family history of malignant neoplasm of digestive organs: Secondary | ICD-10-CM

## 2022-12-05 DIAGNOSIS — Z96643 Presence of artificial hip joint, bilateral: Secondary | ICD-10-CM | POA: Diagnosis present

## 2022-12-05 DIAGNOSIS — I5032 Chronic diastolic (congestive) heart failure: Secondary | ICD-10-CM | POA: Diagnosis present

## 2022-12-05 DIAGNOSIS — J439 Emphysema, unspecified: Secondary | ICD-10-CM | POA: Diagnosis present

## 2022-12-05 DIAGNOSIS — R739 Hyperglycemia, unspecified: Secondary | ICD-10-CM | POA: Diagnosis present

## 2022-12-05 DIAGNOSIS — D6832 Hemorrhagic disorder due to extrinsic circulating anticoagulants: Secondary | ICD-10-CM | POA: Diagnosis not present

## 2022-12-05 DIAGNOSIS — R0902 Hypoxemia: Secondary | ICD-10-CM

## 2022-12-05 DIAGNOSIS — I21A1 Myocardial infarction type 2: Secondary | ICD-10-CM | POA: Diagnosis present

## 2022-12-05 DIAGNOSIS — E876 Hypokalemia: Secondary | ICD-10-CM | POA: Diagnosis present

## 2022-12-05 DIAGNOSIS — J205 Acute bronchitis due to respiratory syncytial virus: Principal | ICD-10-CM | POA: Diagnosis present

## 2022-12-05 DIAGNOSIS — Z825 Family history of asthma and other chronic lower respiratory diseases: Secondary | ICD-10-CM

## 2022-12-05 DIAGNOSIS — Z7901 Long term (current) use of anticoagulants: Secondary | ICD-10-CM

## 2022-12-05 DIAGNOSIS — Z801 Family history of malignant neoplasm of trachea, bronchus and lung: Secondary | ICD-10-CM

## 2022-12-05 DIAGNOSIS — Z885 Allergy status to narcotic agent status: Secondary | ICD-10-CM

## 2022-12-05 DIAGNOSIS — I452 Bifascicular block: Secondary | ICD-10-CM | POA: Diagnosis present

## 2022-12-05 DIAGNOSIS — I471 Supraventricular tachycardia, unspecified: Secondary | ICD-10-CM | POA: Diagnosis present

## 2022-12-05 DIAGNOSIS — Z880 Allergy status to penicillin: Secondary | ICD-10-CM

## 2022-12-05 DIAGNOSIS — G4733 Obstructive sleep apnea (adult) (pediatric): Secondary | ICD-10-CM | POA: Diagnosis present

## 2022-12-05 DIAGNOSIS — J9601 Acute respiratory failure with hypoxia: Secondary | ICD-10-CM | POA: Diagnosis present

## 2022-12-05 DIAGNOSIS — D72828 Other elevated white blood cell count: Secondary | ICD-10-CM | POA: Diagnosis not present

## 2022-12-05 DIAGNOSIS — Z823 Family history of stroke: Secondary | ICD-10-CM

## 2022-12-05 DIAGNOSIS — Z952 Presence of prosthetic heart valve: Secondary | ICD-10-CM

## 2022-12-05 DIAGNOSIS — K219 Gastro-esophageal reflux disease without esophagitis: Secondary | ICD-10-CM | POA: Diagnosis present

## 2022-12-05 DIAGNOSIS — I110 Hypertensive heart disease with heart failure: Secondary | ICD-10-CM | POA: Diagnosis present

## 2022-12-05 DIAGNOSIS — T380X5A Adverse effect of glucocorticoids and synthetic analogues, initial encounter: Secondary | ICD-10-CM | POA: Diagnosis not present

## 2022-12-05 DIAGNOSIS — D62 Acute posthemorrhagic anemia: Secondary | ICD-10-CM | POA: Diagnosis not present

## 2022-12-05 DIAGNOSIS — E785 Hyperlipidemia, unspecified: Secondary | ICD-10-CM | POA: Diagnosis present

## 2022-12-05 DIAGNOSIS — R7303 Prediabetes: Secondary | ICD-10-CM | POA: Diagnosis present

## 2022-12-05 DIAGNOSIS — Z955 Presence of coronary angioplasty implant and graft: Secondary | ICD-10-CM

## 2022-12-05 DIAGNOSIS — I480 Paroxysmal atrial fibrillation: Secondary | ICD-10-CM | POA: Diagnosis present

## 2022-12-05 DIAGNOSIS — K760 Fatty (change of) liver, not elsewhere classified: Secondary | ICD-10-CM | POA: Diagnosis present

## 2022-12-05 DIAGNOSIS — Z888 Allergy status to other drugs, medicaments and biological substances status: Secondary | ICD-10-CM

## 2022-12-05 DIAGNOSIS — R0602 Shortness of breath: Secondary | ICD-10-CM | POA: Diagnosis not present

## 2022-12-05 DIAGNOSIS — I342 Nonrheumatic mitral (valve) stenosis: Secondary | ICD-10-CM | POA: Diagnosis present

## 2022-12-05 DIAGNOSIS — Z79899 Other long term (current) drug therapy: Secondary | ICD-10-CM

## 2022-12-05 DIAGNOSIS — I272 Pulmonary hypertension, unspecified: Secondary | ICD-10-CM | POA: Diagnosis present

## 2022-12-05 DIAGNOSIS — R112 Nausea with vomiting, unspecified: Secondary | ICD-10-CM | POA: Diagnosis present

## 2022-12-05 DIAGNOSIS — N390 Urinary tract infection, site not specified: Secondary | ICD-10-CM | POA: Diagnosis present

## 2022-12-05 DIAGNOSIS — I2510 Atherosclerotic heart disease of native coronary artery without angina pectoris: Secondary | ICD-10-CM | POA: Diagnosis present

## 2022-12-05 DIAGNOSIS — I444 Left anterior fascicular block: Secondary | ICD-10-CM | POA: Diagnosis present

## 2022-12-05 DIAGNOSIS — T45515A Adverse effect of anticoagulants, initial encounter: Secondary | ICD-10-CM | POA: Diagnosis not present

## 2022-12-05 DIAGNOSIS — I421 Obstructive hypertrophic cardiomyopathy: Secondary | ICD-10-CM | POA: Diagnosis present

## 2022-12-05 DIAGNOSIS — Z1152 Encounter for screening for COVID-19: Secondary | ICD-10-CM

## 2022-12-05 LAB — COMPREHENSIVE METABOLIC PANEL
ALT: 20 U/L (ref 0–44)
AST: 35 U/L (ref 15–41)
Albumin: 3.7 g/dL (ref 3.5–5.0)
Alkaline Phosphatase: 76 U/L (ref 38–126)
Anion gap: 11 (ref 5–15)
BUN: 14 mg/dL (ref 8–23)
CO2: 22 mmol/L (ref 22–32)
Calcium: 8.5 mg/dL — ABNORMAL LOW (ref 8.9–10.3)
Chloride: 99 mmol/L (ref 98–111)
Creatinine, Ser: 1.06 mg/dL — ABNORMAL HIGH (ref 0.44–1.00)
GFR, Estimated: 53 mL/min — ABNORMAL LOW (ref >60)
Glucose, Bld: 183 mg/dL — ABNORMAL HIGH (ref 70–99)
Potassium: 3.2 mmol/L — ABNORMAL LOW (ref 3.5–5.1)
Sodium: 132 mmol/L — ABNORMAL LOW (ref 135–145)
Total Bilirubin: 0.9 mg/dL (ref 0.3–1.2)
Total Protein: 6.8 g/dL (ref 6.5–8.1)

## 2022-12-05 LAB — BRAIN NATRIURETIC PEPTIDE: B Natriuretic Peptide: 446.5 pg/mL — ABNORMAL HIGH (ref 0.0–100.0)

## 2022-12-05 LAB — I-STAT CHEM 8, ED
BUN: 15 mg/dL (ref 8–23)
Calcium, Ion: 1.04 mmol/L — ABNORMAL LOW (ref 1.15–1.40)
Chloride: 101 mmol/L (ref 98–111)
Creatinine, Ser: 1 mg/dL (ref 0.44–1.00)
Glucose, Bld: 182 mg/dL — ABNORMAL HIGH (ref 70–99)
HCT: 43 % (ref 36.0–46.0)
Hemoglobin: 14.6 g/dL (ref 12.0–15.0)
Potassium: 3.5 mmol/L (ref 3.5–5.1)
Sodium: 137 mmol/L (ref 135–145)
TCO2: 24 mmol/L (ref 22–32)

## 2022-12-05 LAB — CBC WITH DIFFERENTIAL/PLATELET
Abs Immature Granulocytes: 0.03 10*3/uL (ref 0.00–0.07)
Basophils Absolute: 0 10*3/uL (ref 0.0–0.1)
Basophils Relative: 0 %
Eosinophils Absolute: 0.1 10*3/uL (ref 0.0–0.5)
Eosinophils Relative: 1 %
HCT: 42.9 % (ref 36.0–46.0)
Hemoglobin: 13.9 g/dL (ref 12.0–15.0)
Immature Granulocytes: 0 %
Lymphocytes Relative: 25 %
Lymphs Abs: 2.3 10*3/uL (ref 0.7–4.0)
MCH: 28.5 pg (ref 26.0–34.0)
MCHC: 32.4 g/dL (ref 30.0–36.0)
MCV: 87.9 fL (ref 80.0–100.0)
Monocytes Absolute: 0.9 10*3/uL (ref 0.1–1.0)
Monocytes Relative: 9 %
Neutro Abs: 6.1 10*3/uL (ref 1.7–7.7)
Neutrophils Relative %: 65 %
Platelets: 209 10*3/uL (ref 150–400)
RBC: 4.88 MIL/uL (ref 3.87–5.11)
RDW: 16.8 % — ABNORMAL HIGH (ref 11.5–15.5)
WBC: 9.4 10*3/uL (ref 4.0–10.5)
nRBC: 0 % (ref 0.0–0.2)

## 2022-12-05 LAB — TROPONIN I (HIGH SENSITIVITY): Troponin I (High Sensitivity): 24 ng/L — ABNORMAL HIGH (ref <18)

## 2022-12-05 LAB — MAGNESIUM: Magnesium: 2.1 mg/dL (ref 1.7–2.4)

## 2022-12-05 MED ORDER — AMIODARONE LOAD VIA INFUSION
150.0000 mg | Freq: Once | INTRAVENOUS | Status: AC
  Administered 2022-12-05: 150 mg via INTRAVENOUS
  Filled 2022-12-05: qty 83.34

## 2022-12-05 MED ORDER — ADENOSINE 6 MG/2ML IV SOLN
12.0000 mg | Freq: Once | INTRAVENOUS | Status: AC
  Administered 2022-12-05: 12 mg via INTRAVENOUS

## 2022-12-05 MED ORDER — LACTATED RINGERS IV BOLUS
1000.0000 mL | Freq: Once | INTRAVENOUS | Status: AC
  Administered 2022-12-06: 1000 mL via INTRAVENOUS

## 2022-12-05 MED ORDER — METHYLPREDNISOLONE SODIUM SUCC 125 MG IJ SOLR
125.0000 mg | Freq: Once | INTRAMUSCULAR | Status: AC
  Administered 2022-12-06: 125 mg via INTRAVENOUS
  Filled 2022-12-05: qty 2

## 2022-12-05 MED ORDER — ADENOSINE 6 MG/2ML IV SOLN
6.0000 mg | Freq: Once | INTRAVENOUS | Status: AC
  Administered 2022-12-05: 6 mg via INTRAVENOUS
  Filled 2022-12-05: qty 2

## 2022-12-05 MED ORDER — SODIUM CHLORIDE 0.9 % IV BOLUS
1000.0000 mL | Freq: Once | INTRAVENOUS | Status: AC
  Administered 2022-12-05: 1000 mL via INTRAVENOUS

## 2022-12-05 MED ORDER — AMIODARONE HCL IN DEXTROSE 360-4.14 MG/200ML-% IV SOLN
INTRAVENOUS | Status: AC
  Administered 2022-12-05: 60 mg/h via INTRAVENOUS
  Filled 2022-12-05: qty 200

## 2022-12-05 MED ORDER — AMIODARONE HCL IN DEXTROSE 360-4.14 MG/200ML-% IV SOLN
30.0000 mg/h | INTRAVENOUS | Status: DC
  Administered 2022-12-06: 30 mg/h via INTRAVENOUS

## 2022-12-05 MED ORDER — AMIODARONE HCL IN DEXTROSE 360-4.14 MG/200ML-% IV SOLN
60.0000 mg/h | INTRAVENOUS | Status: DC
  Filled 2022-12-05: qty 200

## 2022-12-05 NOTE — ED Triage Notes (Signed)
Pt bib [facility] from home pt has been coughing and having sob for the past three days. Tonight she was having severe sob and could not even speak. Pt was sating at 89% on room air with ems arrival. Placed on 4L nasal canula and 94%. Pt was wheezing and diminished lung sounds bilaterally they have albuterol breathing treatment. Still noted some wheezing so administered duoneb, wheezing still noted.  166/66 94hr 32rr End tidal 39

## 2022-12-05 NOTE — ED Provider Notes (Signed)
[facility] EMERGENCY DEPARTMENT Provider Note   CSN: [id number] Arrival date & time: 12/05/22  2142     History {Add pertinent medical, surgical, social history, OB history to HPI:1} Chief Complaint  Patient presents with   Shortness of Breath    Victoria Delgado is a 82 y.o. female.  The history is provided by the patient and medical records.   82 year old female with history of A-fib on Eliquis, coronary artery disease, GERD, hypertension, OSA on CPAP, presenting to the ED with SOB.  States for the past 3 days she has been having cough and SOB.  EMS called today, sats 89% on arrival, started on 4L and sats improved to around 94%.  She is not generally oxygen dependent.  She was given albuterol treatment followed by DuoNeb with some improvement of her wheezing but still feeling short of breath on arrival.  Upon entering treatment room, patient was noted to be in SVT with rate around 200.  She is unsure about history of this in the past but feels like this is likely from the albuterol.  Home Medications Prior to Admission medications   Medication Sig Start Date End Date Taking? Authorizing Provider  acetaminophen (TYLENOL) 500 MG tablet Take 1,000 mg by mouth every 6 (six) hours as needed for moderate pain or headache.    [provider]  alum & mag hydroxide-simeth (GELUSIL) 200-200-25 MG chewable tablet Taking 2-4 chewables by mouth daily    [provider]  azithromycin (ZITHROMAX) 500 MG tablet Take 30-60 min prior to dental procedures. 03/26/22   [facility], Tami Lin, PA  cholecalciferol (VITAMIN D3) 25 MCG (1000 UNIT) tablet Take 1,000 Units by mouth 2 (two) times daily.    [provider]  Coenzyme Q10 (COQ10) 100 MG CAPS Take 100 mg by mouth daily.    [provider]  ELIQUIS 5 MG TABS tablet TAKE 1 TABLET(5 MG) BY MOUTH TWICE DAILY 12/04/22   Minus Breeding, MD  ezetimibe (ZETIA) 10 MG tablet TAKE 1 TABLET(10 MG) BY MOUTH DAILY  09/17/22   Minus Breeding, MD  furosemide (LASIX) 40 MG tablet TAKE 1 TABLET(40 MG) BY MOUTH DAILY 06/14/22   Minus Breeding, MD  levalbuterol [facility] HFA) 45 MCG/ACT inhaler Inhale 1-2 puffs into the lungs every 6 (six) hours as needed for wheezing. 06/26/22   Maryjane Hurter, MD  loteprednol (LOTEMAX) 0.5 % ophthalmic suspension Place 1 drop into both eyes daily as needed (imflammation).    [provider]  metoprolol succinate (TOPROL XL) 50 MG 24 hr tablet Take 1 tablet (50 mg total) by mouth in the morning and at bedtime. 04/23/22   Minus Breeding, MD  Multiple Vitamin (MULTIVITAMIN) tablet Take 1 tablet by mouth 2 (two) times a week.    [provider]  nitroGLYCERIN (NITROSTAT) 0.4 MG SL tablet Place 1 tablet (0.4 mg total) under the tongue every 5 (five) minutes as needed for chest pain. 05/07/16   Burtis Junes, NP  omeprazole (PRILOSEC) 10 MG capsule TAKE 2 CAPSULES(20 MG) BY MOUTH DAILY Patient taking differently: Take 10 mg by mouth daily. 07/17/22   Irene Shipper, MD  Probiotic Product (PROBIOTIC DAILY PO) Take 1 capsule by mouth daily.    [provider]  Propylene Glycol (SYSTANE BALANCE) 0.6 % SOLN Place 1 drop into both eyes 2 (two) times daily as needed (dry eyes).    [provider]  rosuvastatin (CRESTOR) 20 MG tablet Take 20 mg by mouth daily.  [provider]  Tiotropium Bromide Monohydrate (SPIRIVA RESPIMAT) 1.25 MCG/ACT AERS Inhale 2 puffs into the lungs daily. 08/10/22   Omar Person, MD      Allergies    Codeine, Kenalog [triamcinolone acetonide], Kenalog [triamcinolone], Pantoprazole, Relafen [nabumetone], Epinephrine, Penicillins, and Tramadol    Review of Systems   Review of Systems  Respiratory:  Positive for cough and shortness of breath.   Cardiovascular:  Positive for palpitations.  All other systems reviewed and are negative.   Physical Exam Updated Vital Signs BP 105/78   Pulse (!) 197   Temp 97.9  F (36.6 C) (Axillary)   Resp (!) 40   SpO2 96%   Physical Exam Vitals and nursing note reviewed.  Constitutional:      Appearance: She is well-developed.  HENT:     Head: Normocephalic and atraumatic.  Eyes:     Conjunctiva/sclera: Conjunctivae normal.     Pupils: Pupils are equal, round, and reactive to light.  Cardiovascular:     Rate and Rhythm: Tachycardia present.     Heart sounds: Normal heart sounds.     Comments: SVT rate 200's upon arrival Pulmonary:     Effort: Pulmonary effort is normal.     Breath sounds: Normal breath sounds.  Abdominal:     General: Bowel sounds are normal.     Palpations: Abdomen is soft.  Musculoskeletal:        General: Normal range of motion.     Cervical back: Normal range of motion.  Skin:    General: Skin is warm and dry.  Neurological:     Mental Status: She is alert and oriented to person, place, and time.     ED Results / Procedures / Treatments   Labs (all labs ordered are listed, but only abnormal results are displayed) Labs Reviewed  I-STAT CHEM 8, ED - Abnormal; Notable for the following components:      Result Value   Glucose, Bld 182 (*)    Calcium, Ion 1.04 (*)    All other components within normal limits  RESP PANEL BY RT-PCR (RSV, FLU A&B, COVID)  RVPGX2  CBC WITH DIFFERENTIAL/PLATELET  COMPREHENSIVE METABOLIC PANEL  BRAIN NATRIURETIC PEPTIDE  MAGNESIUM  TROPONIN I (HIGH SENSITIVITY)    EKG None  Radiology DG Chest 1 View  Result Date: 12/05/2022 CLINICAL DATA:  Shortness of breath. EXAM: CHEST  1 VIEW COMPARISON:  Radiograph 11/24/2021 FINDINGS: Stable heart size and mediastinal contours. Peribronchial thickening. No focal airspace disease. No pleural effusion or pneumothorax. No acute osseous findings. IMPRESSION: Peribronchial thickening may be bronchitic or congestive. Electronically Signed   By: Narda Rutherford M.D.   On: 12/05/2022 22:57    Procedures Procedures  {Document cardiac monitor, telemetry  assessment procedure when appropriate:1}  CRITICAL CARE Performed by: Garlon Hatchet   Total critical care time: 45 minutes  Critical care time was exclusive of separately billable procedures and treating other patients.  Critical care was necessary to treat or prevent imminent or life-threatening deterioration.  Critical care was time spent personally by me on the following activities: development of treatment plan with patient and/or surrogate as well as nursing, discussions with consultants, evaluation of patient's response to treatment, examination of patient, obtaining history from patient or surrogate, ordering and performing treatments and interventions, ordering and review of laboratory studies, ordering and review of radiographic studies, pulse oximetry and re-evaluation of patient's condition.   Medications Ordered in ED Medications  lactated ringers bolus 1,000 mL (has  no administration in time range)  amiodarone (NEXTERONE) 1.8 mg/mL load via infusion 150 mg (150 mg Intravenous Bolus from Bag 12/05/22 2249)    Followed by  amiodarone (NEXTERONE PREMIX) 360-4.14 MG/200ML-% (1.8 mg/mL) IV infusion (60 mg/hr Intravenous New Bag/Given 12/05/22 2249)    Followed by  amiodarone (NEXTERONE PREMIX) 360-4.14 MG/200ML-% (1.8 mg/mL) IV infusion (has no administration in time range)  adenosine (ADENOCARD) 6 MG/2ML injection 6 mg (6 mg Intravenous Given 12/05/22 2232)  sodium chloride 0.9 % bolus 1,000 mL (1,000 mLs Intravenous New Bag/Given 12/05/22 2253)  adenosine (ADENOCARD) 6 MG/2ML injection 12 mg (12 mg Intravenous Given 12/05/22 2235)    ED Course/ Medical Decision Making/ A&P                          Medical Decision Making Amount and/or Complexity of Data Reviewed Labs: ordered. Radiology: ordered and independent interpretation performed. ECG/medicine tests: ordered and independent interpretation performed.  Risk Prescription drug management.   82 year old female presenting to  the ED with shortness of breath.  Has had cough and shortness of breath over the past 3 days, worsening today.  Hypoxic upon EMS arrival 89%, she is not generally oxygen dependent.  She was started on 4 L with improvement, given albuterol followed by DuoNeb.  Upon arrival to ED she has noted to be in SVT with rate around 200s.  She does have palpitations but denies any chest pain.  BP is stable.    Attempted Valsalva several times without any resolution.  Patient was placed on zoll pads,  PIV established.  Administered 6 mg adenosine followed by 12 mg without termination of SVT.  She was then given amiodarone bolus followed by drip and did convert to sinus rhythm with rates in the 80s.  She does admit that she is feeling better.  She does still have wheezing at this time, however reluctant to give her further albuterol.  Will give dose of Solu-Medrol.  Chest x-ray was obtained revealing likely bronchitis, no focal infiltrate.  Labs are pending.  She will require admission.  Final Clinical Impression(s) / ED Diagnoses Final diagnoses:  None    Rx / DC Orders ED Discharge Orders     None

## 2022-12-06 ENCOUNTER — Inpatient Hospital Stay (HOSPITAL_COMMUNITY): Payer: Medicare Other

## 2022-12-06 DIAGNOSIS — B338 Other specified viral diseases: Secondary | ICD-10-CM | POA: Diagnosis not present

## 2022-12-06 DIAGNOSIS — R0602 Shortness of breath: Secondary | ICD-10-CM | POA: Diagnosis present

## 2022-12-06 DIAGNOSIS — Z79899 Other long term (current) drug therapy: Secondary | ICD-10-CM | POA: Diagnosis not present

## 2022-12-06 DIAGNOSIS — I110 Hypertensive heart disease with heart failure: Secondary | ICD-10-CM | POA: Diagnosis present

## 2022-12-06 DIAGNOSIS — T380X5A Adverse effect of glucocorticoids and synthetic analogues, initial encounter: Secondary | ICD-10-CM | POA: Diagnosis not present

## 2022-12-06 DIAGNOSIS — T45515A Adverse effect of anticoagulants, initial encounter: Secondary | ICD-10-CM | POA: Diagnosis not present

## 2022-12-06 DIAGNOSIS — N390 Urinary tract infection, site not specified: Secondary | ICD-10-CM | POA: Diagnosis present

## 2022-12-06 DIAGNOSIS — I471 Supraventricular tachycardia, unspecified: Secondary | ICD-10-CM | POA: Diagnosis present

## 2022-12-06 DIAGNOSIS — I5032 Chronic diastolic (congestive) heart failure: Secondary | ICD-10-CM | POA: Diagnosis present

## 2022-12-06 DIAGNOSIS — I421 Obstructive hypertrophic cardiomyopathy: Secondary | ICD-10-CM | POA: Diagnosis present

## 2022-12-06 DIAGNOSIS — K760 Fatty (change of) liver, not elsewhere classified: Secondary | ICD-10-CM | POA: Diagnosis present

## 2022-12-06 DIAGNOSIS — Z7901 Long term (current) use of anticoagulants: Secondary | ICD-10-CM | POA: Diagnosis not present

## 2022-12-06 DIAGNOSIS — I480 Paroxysmal atrial fibrillation: Secondary | ICD-10-CM | POA: Diagnosis present

## 2022-12-06 DIAGNOSIS — Z952 Presence of prosthetic heart valve: Secondary | ICD-10-CM | POA: Diagnosis not present

## 2022-12-06 DIAGNOSIS — J441 Chronic obstructive pulmonary disease with (acute) exacerbation: Secondary | ICD-10-CM | POA: Diagnosis not present

## 2022-12-06 DIAGNOSIS — I272 Pulmonary hypertension, unspecified: Secondary | ICD-10-CM | POA: Diagnosis present

## 2022-12-06 DIAGNOSIS — I452 Bifascicular block: Secondary | ICD-10-CM | POA: Diagnosis present

## 2022-12-06 DIAGNOSIS — J439 Emphysema, unspecified: Secondary | ICD-10-CM | POA: Diagnosis present

## 2022-12-06 DIAGNOSIS — J9601 Acute respiratory failure with hypoxia: Secondary | ICD-10-CM

## 2022-12-06 DIAGNOSIS — R008 Other abnormalities of heart beat: Secondary | ICD-10-CM

## 2022-12-06 DIAGNOSIS — J205 Acute bronchitis due to respiratory syncytial virus: Secondary | ICD-10-CM | POA: Diagnosis present

## 2022-12-06 DIAGNOSIS — I2510 Atherosclerotic heart disease of native coronary artery without angina pectoris: Secondary | ICD-10-CM | POA: Diagnosis not present

## 2022-12-06 DIAGNOSIS — Z5181 Encounter for therapeutic drug level monitoring: Secondary | ICD-10-CM | POA: Diagnosis not present

## 2022-12-06 DIAGNOSIS — I214 Non-ST elevation (NSTEMI) myocardial infarction: Secondary | ICD-10-CM

## 2022-12-06 DIAGNOSIS — I21A1 Myocardial infarction type 2: Secondary | ICD-10-CM | POA: Diagnosis present

## 2022-12-06 DIAGNOSIS — Z6841 Body Mass Index (BMI) 40.0 and over, adult: Secondary | ICD-10-CM | POA: Diagnosis not present

## 2022-12-06 DIAGNOSIS — M7981 Nontraumatic hematoma of soft tissue: Secondary | ICD-10-CM | POA: Diagnosis not present

## 2022-12-06 DIAGNOSIS — D6832 Hemorrhagic disorder due to extrinsic circulating anticoagulants: Secondary | ICD-10-CM | POA: Diagnosis not present

## 2022-12-06 DIAGNOSIS — Z1152 Encounter for screening for COVID-19: Secondary | ICD-10-CM | POA: Diagnosis not present

## 2022-12-06 DIAGNOSIS — D72828 Other elevated white blood cell count: Secondary | ICD-10-CM | POA: Diagnosis not present

## 2022-12-06 DIAGNOSIS — D62 Acute posthemorrhagic anemia: Secondary | ICD-10-CM | POA: Diagnosis not present

## 2022-12-06 LAB — URINALYSIS, ROUTINE W REFLEX MICROSCOPIC
Bilirubin Urine: NEGATIVE
Glucose, UA: 50 mg/dL — AB
Hgb urine dipstick: NEGATIVE
Ketones, ur: 5 mg/dL — AB
Leukocytes,Ua: NEGATIVE
Nitrite: NEGATIVE
Protein, ur: 100 mg/dL — AB
Specific Gravity, Urine: 1.02 (ref 1.005–1.030)
pH: 5 (ref 5.0–8.0)

## 2022-12-06 LAB — CBC WITH DIFFERENTIAL/PLATELET
Abs Immature Granulocytes: 0 10*3/uL (ref 0.00–0.07)
Basophils Absolute: 0 10*3/uL (ref 0.0–0.1)
Basophils Relative: 0 %
Eosinophils Absolute: 0 10*3/uL (ref 0.0–0.5)
Eosinophils Relative: 0 %
HCT: 39.3 % (ref 36.0–46.0)
Hemoglobin: 12.7 g/dL (ref 12.0–15.0)
Lymphocytes Relative: 4 %
Lymphs Abs: 0.3 10*3/uL — ABNORMAL LOW (ref 0.7–4.0)
MCH: 28.5 pg (ref 26.0–34.0)
MCHC: 32.3 g/dL (ref 30.0–36.0)
MCV: 88.1 fL (ref 80.0–100.0)
Monocytes Absolute: 0 10*3/uL — ABNORMAL LOW (ref 0.1–1.0)
Monocytes Relative: 0 %
Neutro Abs: 6.8 10*3/uL (ref 1.7–7.7)
Neutrophils Relative %: 96 %
Platelets: 188 10*3/uL (ref 150–400)
RBC: 4.46 MIL/uL (ref 3.87–5.11)
RDW: 16.9 % — ABNORMAL HIGH (ref 11.5–15.5)
WBC: 7.1 10*3/uL (ref 4.0–10.5)
nRBC: 0 % (ref 0.0–0.2)
nRBC: 0 /100 WBC

## 2022-12-06 LAB — RESPIRATORY PANEL BY PCR

## 2022-12-06 LAB — COMPREHENSIVE METABOLIC PANEL
ALT: 23 U/L (ref 0–44)
AST: 46 U/L — ABNORMAL HIGH (ref 15–41)
Albumin: 3.4 g/dL — ABNORMAL LOW (ref 3.5–5.0)
Alkaline Phosphatase: 67 U/L (ref 38–126)
Anion gap: 12 (ref 5–15)
BUN: 11 mg/dL (ref 8–23)
CO2: 22 mmol/L (ref 22–32)
Calcium: 8.3 mg/dL — ABNORMAL LOW (ref 8.9–10.3)
Chloride: 101 mmol/L (ref 98–111)
Creatinine, Ser: 0.98 mg/dL (ref 0.44–1.00)
GFR, Estimated: 59 mL/min — ABNORMAL LOW (ref >60)
Glucose, Bld: 270 mg/dL — ABNORMAL HIGH (ref 70–99)
Potassium: 3.5 mmol/L (ref 3.5–5.1)
Sodium: 135 mmol/L (ref 135–145)
Total Bilirubin: 0.8 mg/dL (ref 0.3–1.2)
Total Protein: 6.2 g/dL — ABNORMAL LOW (ref 6.5–8.1)

## 2022-12-06 LAB — ECHOCARDIOGRAM COMPLETE
AR max vel: 3.69 cm2
AV Area VTI: 2.17 cm2
AV Area mean vel: 1.87 cm2
AV Mean grad: 20 mmHg
AV Peak grad: 41.2 mmHg
Ao pk vel: 3.21 m/s
Area-P 1/2: 2.6 cm2
Height: 68 in
MV VTI: 1.74 cm2
S' Lateral: 2.4 cm
Weight: 4448 oz

## 2022-12-06 LAB — RESP PANEL BY RT-PCR (RSV, FLU A&B, COVID)  RVPGX2
Influenza A by PCR: NEGATIVE
Influenza B by PCR: NEGATIVE
Resp Syncytial Virus by PCR: POSITIVE — AB
SARS Coronavirus 2 by RT PCR: NEGATIVE

## 2022-12-06 LAB — TROPONIN I (HIGH SENSITIVITY)
Troponin I (High Sensitivity): 747 ng/L (ref <18)
Troponin I (High Sensitivity): 1200 ng/L (ref <18)
Troponin I (High Sensitivity): 1360 ng/L (ref <18)

## 2022-12-06 LAB — APTT: aPTT: 144 seconds — ABNORMAL HIGH (ref 24–36)

## 2022-12-06 LAB — PHOSPHORUS: Phosphorus: 3.4 mg/dL (ref 2.5–4.6)

## 2022-12-06 LAB — MAGNESIUM: Magnesium: 2 mg/dL (ref 1.7–2.4)

## 2022-12-06 LAB — HEPARIN LEVEL (UNFRACTIONATED): Heparin Unfractionated: >1.1 IU/mL — ABNORMAL HIGH (ref 0.30–0.70)

## 2022-12-06 LAB — CBG MONITORING, ED: Glucose-Capillary: 253 mg/dL — ABNORMAL HIGH (ref 70–99)

## 2022-12-06 MED ORDER — ONDANSETRON HCL 4 MG/2ML IJ SOLN: 4.0000 mg | Freq: Four times a day (QID) | INTRAMUSCULAR | Status: DC | PRN

## 2022-12-06 MED ORDER — GUAIFENESIN-DM 100-10 MG/5ML PO SYRP
5.0000 mL | ORAL_SOLUTION | ORAL | Status: DC | PRN
  Filled 2022-12-06: qty 5

## 2022-12-06 MED ORDER — DOXYCYCLINE HYCLATE 100 MG PO TABS
100.0000 mg | ORAL_TABLET | Freq: Two times a day (BID) | ORAL | Status: AC
  Administered 2022-12-06 – 2022-12-10 (×10): 100 mg via ORAL
  Filled 2022-12-06 (×10): qty 1

## 2022-12-06 MED ORDER — FAMOTIDINE 20 MG PO TABS
20.0000 mg | ORAL_TABLET | Freq: Every day | ORAL | Status: DC
  Administered 2022-12-06 – 2022-12-09 (×4): 20 mg via ORAL
  Filled 2022-12-06 (×4): qty 1

## 2022-12-06 MED ORDER — BENZONATATE 100 MG PO CAPS
100.0000 mg | ORAL_CAPSULE | Freq: Three times a day (TID) | ORAL | Status: DC
  Administered 2022-12-06 – 2022-12-13 (×22): 100 mg via ORAL
  Filled 2022-12-06 (×22): qty 1

## 2022-12-06 MED ORDER — ROSUVASTATIN CALCIUM 20 MG PO TABS
20.0000 mg | ORAL_TABLET | Freq: Every day | ORAL | Status: DC
  Administered 2022-12-06 – 2022-12-13 (×8): 20 mg via ORAL
  Filled 2022-12-06 (×8): qty 1

## 2022-12-06 MED ORDER — APIXABAN 5 MG PO TABS: 5.0000 mg | ORAL_TABLET | Freq: Two times a day (BID) | ORAL | Status: DC

## 2022-12-06 MED ORDER — METHYLPREDNISOLONE SODIUM SUCC 40 MG IJ SOLR
40.0000 mg | Freq: Two times a day (BID) | INTRAMUSCULAR | Status: DC
  Administered 2022-12-06 – 2022-12-07 (×3): 40 mg via INTRAVENOUS
  Filled 2022-12-06 (×3): qty 1

## 2022-12-06 MED ORDER — IPRATROPIUM BROMIDE 0.02 % IN SOLN: 0.5000 mg | Freq: Four times a day (QID) | RESPIRATORY_TRACT | Status: DC

## 2022-12-06 MED ORDER — HEPARIN (PORCINE) 25000 UT/250ML-% IV SOLN
1500.0000 [IU]/h | INTRAVENOUS | Status: DC
  Administered 2022-12-06: 1500 [IU]/h via INTRAVENOUS
  Filled 2022-12-06: qty 250

## 2022-12-06 MED ORDER — CLOPIDOGREL BISULFATE 75 MG PO TABS
75.0000 mg | ORAL_TABLET | Freq: Every day | ORAL | Status: DC
  Administered 2022-12-06 – 2022-12-09 (×4): 75 mg via ORAL
  Filled 2022-12-06 (×4): qty 1

## 2022-12-06 MED ORDER — ACETAMINOPHEN 650 MG RE SUPP: 650.0000 mg | Freq: Four times a day (QID) | RECTAL | Status: DC | PRN

## 2022-12-06 MED ORDER — EZETIMIBE 10 MG PO TABS
10.0000 mg | ORAL_TABLET | Freq: Every day | ORAL | Status: DC
  Administered 2022-12-06 – 2022-12-13 (×8): 10 mg via ORAL
  Filled 2022-12-06 (×8): qty 1

## 2022-12-06 MED ORDER — METOPROLOL SUCCINATE ER 50 MG PO TB24
50.0000 mg | ORAL_TABLET | Freq: Every day | ORAL | Status: DC
  Administered 2022-12-06: 50 mg via ORAL
  Filled 2022-12-06: qty 2

## 2022-12-06 MED ORDER — ACETAMINOPHEN 325 MG PO TABS: 650.0000 mg | ORAL_TABLET | Freq: Four times a day (QID) | ORAL | Status: DC | PRN

## 2022-12-06 MED ORDER — METOPROLOL SUCCINATE ER 50 MG PO TB24
50.0000 mg | ORAL_TABLET | Freq: Two times a day (BID) | ORAL | Status: DC
  Administered 2022-12-06 – 2022-12-13 (×14): 50 mg via ORAL
  Filled 2022-12-06 (×14): qty 1

## 2022-12-06 MED ORDER — LEVALBUTEROL HCL 0.63 MG/3ML IN NEBU
0.6300 mg | INHALATION_SOLUTION | Freq: Four times a day (QID) | RESPIRATORY_TRACT | Status: DC
  Administered 2022-12-06 – 2022-12-07 (×5): 0.63 mg via RESPIRATORY_TRACT
  Filled 2022-12-06 (×6): qty 3

## 2022-12-06 MED ORDER — UMECLIDINIUM BROMIDE 62.5 MCG/ACT IN AEPB
1.0000 | INHALATION_SPRAY | Freq: Every day | RESPIRATORY_TRACT | Status: DC
  Administered 2022-12-06 – 2022-12-13 (×8): 1 via RESPIRATORY_TRACT
  Filled 2022-12-06: qty 7

## 2022-12-06 MED ORDER — VITAMIN D 25 MCG (1000 UNIT) PO TABS
1000.0000 [IU] | ORAL_TABLET | Freq: Two times a day (BID) | ORAL | Status: DC
  Administered 2022-12-06 – 2022-12-13 (×15): 1000 [IU] via ORAL
  Filled 2022-12-06 (×16): qty 1

## 2022-12-06 MED ORDER — ADULT MULTIVITAMIN W/MINERALS CH
1.0000 | ORAL_TABLET | ORAL | Status: DC
  Administered 2022-12-06 – 2022-12-13 (×2): 1 via ORAL
  Filled 2022-12-06 (×3): qty 1

## 2022-12-06 MED ORDER — HEPARIN (PORCINE) 25000 UT/250ML-% IV SOLN
1200.0000 [IU]/h | INTRAVENOUS | Status: DC
  Administered 2022-12-07 (×2): 1300 [IU]/h via INTRAVENOUS
  Administered 2022-12-08 – 2022-12-09 (×2): 1200 [IU]/h via INTRAVENOUS
  Filled 2022-12-06 (×4): qty 250

## 2022-12-06 MED ORDER — IPRATROPIUM BROMIDE 0.02 % IN SOLN
0.5000 mg | Freq: Four times a day (QID) | RESPIRATORY_TRACT | Status: DC
  Administered 2022-12-06 (×2): 0.5 mg via RESPIRATORY_TRACT
  Filled 2022-12-06 (×2): qty 2.5

## 2022-12-06 MED ORDER — FUROSEMIDE 40 MG PO TABS
40.0000 mg | ORAL_TABLET | Freq: Every day | ORAL | Status: DC
  Administered 2022-12-06 – 2022-12-09 (×4): 40 mg via ORAL
  Filled 2022-12-06 (×2): qty 1
  Filled 2022-12-06: qty 2
  Filled 2022-12-06: qty 1

## 2022-12-06 MED ORDER — ONDANSETRON HCL 4 MG PO TABS: 4.0000 mg | ORAL_TABLET | Freq: Four times a day (QID) | ORAL | Status: DC | PRN

## 2022-12-06 NOTE — ED Notes (Signed)
Frannie advised of critical trop [phone]

## 2022-12-06 NOTE — ED Notes (Signed)
Transfer of care report given to RN covering room 4.

## 2022-12-06 NOTE — Progress Notes (Signed)
ANTICOAGULATION CONSULT NOTE - Initial Consult  Pharmacy Consult for heparin Indication: chest pain/ACS and atrial fibrillation  Allergies  Allergen Reactions   Codeine Nausea Only   Kenalog [Triamcinolone Acetonide] Other (See Comments)    Had A.fib after she received the injection."Steroid meds"   Kenalog [Triamcinolone] Rash    Other reaction(s): AFIB   Pantoprazole Rash    Other reaction(s): chronic upper GI pain all the way to back   Relafen [Nabumetone] Other (See Comments)    Edema    Epinephrine Palpitations   Penicillins Rash    40 years ago, injection site was red and swollen.  rash, facial/tongue/throat swelling, SOB or lightheadedness with hypotension: Yes Has patient had a PCN reaction causing immediate  Has patient had a PCN reaction causing severe rash involving mucus membranes or skin necrosis: NO Has patient had a PCN reaction that required hospitalization. No  Has patient had a PCN reaction occurring within the last 10 years: No If all of the above answers are "NO", then may proceed with Cephalosporin use.     Tramadol Other (See Comments)    Auditory halllucinations    Patient Measurements: Height: 5\' 8"  (172.7 cm) Weight: 126.1 kg (278 lb) IBW/kg (Calculated) : 63.9 Heparin Dosing Weight: 95kg  Vital Signs: Temp: 97.8 F (36.6 C) (01/04 0445) Temp Source: Oral (01/04 0445) BP: 103/82 (01/04 0600) Pulse Rate: 78 (01/04 0600)  Labs: Recent Labs    12/05/22 2234 12/05/22 2240 12/06/22 0109  HGB 13.9 14.6  --   HCT 42.9 43.0  --   PLT 209  --   --   CREATININE 1.06* 1.00  --   TROPONINIHS 24*  --  747*    Estimated Creatinine Clearance: 63.9 mL/min (by C-G formula based on SCr of 1 mg/dL).   Medical History: Past Medical History:  Diagnosis Date   Abdominal pain    Arthritis    osteoarthritis-hips,knees, back, hands   Asymmetric septal hypertrophy (HCC)    Atrial fibrillation (HCC)    CAD (coronary artery disease)    2 drug-eluting  stents placed in the circumflex leading into a marginal.  May 2017.  [name].   catheterization on 03/19/2018, this showed patent stent in the proximal to mid left circumflex artery, 20% ostial left circumflex artery disease, 40% OM 3 disease, widely patent stent in the ostial D1, 20% ostial to proximal LAD disease, 20% mid LAD disease.   Cataract    corrective surgery done   Fatty liver    Gallstones    GERD (gastroesophageal reflux disease)    Hepatitis    hepatitis A; past hx.,many yrs ago ? food source   Hernia, hiatal    Hyperlipidemia    Hypertension    Obesity    Sleep apnea    CPAP    Assessment: 82yo female c/o SOB and cough, found to be positive for RSV and was in SVT on arrival to ED, known h/o Afib, initial troponin mildly elevated but now rising sharply >> to begin heparin; on Eliquis PTA for Afib, last dose 1/3 9a.  Goal of Therapy:  Heparin level 0.3-0.7 units/ml aPTT 66-102 seconds Monitor platelets by anticoagulation protocol: Yes   Plan:  Start heparin infusion at 1500 units/hr. Monitor heparin level, aPTT (while Eliquis affects anti-Xa assay), and CBC.  Wynona Neat, PharmD, BCPS  12/06/2022,6:21 AM

## 2022-12-06 NOTE — Consult Note (Signed)
NAME:  Victoria Delgado, MRN:  [id number], DOB:  1941/07/12, LOS: 0 ADMISSION DATE:  12/05/2022, CONSULTATION DATE: 12/06/2018 REFERRING MD: [facility], CHIEF COMPLAINT: Shortness of breath  History of Present Illness:  82 year old female pulmonary patient of Dr. Verlee Monte who has a plethora of health issues that are well-documented below and is also carries the allergy to albuterol which creates SVT when taking.  She presents with approximately 2 weeks of increasing respiratory distress, clear sputum production, postnasal drip and now positive for RSV.  And route to the hospital she was given albuterol which created an SVT which is now resolved.  She notes postnasal drip, productive cough with opaque sputum without fevers chills or sweats.  She is positive for RSV.  Pulmonary critical care asked to evaluate.  Pertinent  Medical History   Past Medical History:  Diagnosis Date   Abdominal pain    Arthritis    osteoarthritis-hips,knees, back, hands   Asymmetric septal hypertrophy (HCC)    Atrial fibrillation (HCC)    CAD (coronary artery disease)    2 drug-eluting stents placed in the circumflex leading into a marginal.  May 2017.  [name].   catheterization on 03/19/2018, this showed patent stent in the proximal to mid left circumflex artery, 20% ostial left circumflex artery disease, 40% OM 3 disease, widely patent stent in the ostial D1, 20% ostial to proximal LAD disease, 20% mid LAD disease.   Cataract    corrective surgery done   Fatty liver    Gallstones    GERD (gastroesophageal reflux disease)    Hepatitis    hepatitis A; past hx.,many yrs ago ? food source   Hernia, hiatal    Hyperlipidemia    Hypertension    Obesity    Sleep apnea    CPAP     Significant Hospital Events: Including procedures, antibiotic start and stop dates in addition to other pertinent events   Positive RSV  Interim History / Subjective:  82 year old female no acute distress positive for RSV  Objective    Blood pressure 112/65, pulse 84, temperature 97.8 F (36.6 C), temperature source Oral, resp. rate (!) 25, height 5\' 8"  (1.727 m), weight 126.1 kg, SpO2 94 %.    FiO2 (%):  [100 %] 100 %   Intake/Output Summary (Last 24 hours) at 12/06/2022 0914 Last data filed at 12/06/2022 0756 Gross per 24 hour  Intake --  Output 401 ml  Net -401 ml   Filed Weights   12/06/22 0600  Weight: 126.1 kg    Examination: General: Obese female no acute distress HENT: No JVD is appreciated Lungs: Mild expiratory wheezes with a congested cough Cardiovascular: Heart sounds are regular Abdomen: Obese soft Extremities: Warm dry 1+ edema Neuro: Intact without focal defect GU:   Resolved Hospital Problem list     Assessment & Plan:  Worsening respiratory distress over the last 2 weeks with increasing postnasal drip coughing and wheezing and she presents to the emergency department today is positive for RSV. O2 as needed Bronchodilators with Xopenex she is allergic to albuterol inhaler which creates supraventricular tachycardia in the setting of atrial fibrillation. Agree with steroids and she is wheezing Too far out for antivirals Avoid albuterol  History of atrial fibrillation rapid ventricular response, aortic valve replacement on chronic Eliquis and requires Metroprolol Cardizem amiodarone for rate control Per primary Note beta-blocker can be contributing to her wheezing Per primary  Morbid obesity Weight loss  Best Practice (right click and "Reselect all SmartList Selections"  daily)   Diet/type: Regular consistency (see orders) DVT prophylaxis: systemic heparin GI prophylaxis: PPI Lines: N/A Foley:  N/A Code Status:  full code Last date of multidisciplinary goals of care discussion [tbd]  Labs   CBC: Recent Labs  Lab 12/05/22 2234 12/05/22 2240 12/06/22 0810  WBC 9.4  --  7.1  NEUTROABS 6.1  --  6.8  HGB 13.9 14.6 12.7  HCT 42.9 43.0 39.3  MCV 87.9  --  88.1  PLT 209  --  188     Basic Metabolic Panel: Recent Labs  Lab 12/05/22 2234 12/05/22 2240  NA 132* 137  K 3.2* 3.5  CL 99 101  CO2 22  --   GLUCOSE 183* 182*  BUN 14 15  CREATININE 1.06* 1.00  CALCIUM 8.5*  --   MG 2.1  --    GFR: Estimated Creatinine Clearance: 63.9 mL/min (by C-G formula based on SCr of 1 mg/dL). Recent Labs  Lab 12/05/22 2234 12/06/22 0810  WBC 9.4 7.1    Liver Function Tests: Recent Labs  Lab 12/05/22 2234  AST 35  ALT 20  ALKPHOS 76  BILITOT 0.9  PROT 6.8  ALBUMIN 3.7   No results for input(s): "LIPASE", "AMYLASE" in the last 168 hours. No results for input(s): "AMMONIA" in the last 168 hours.  ABG    Component Value Date/Time   PHART 7.394 07/11/2020 0804   PCO2ART 43.2 07/11/2020 0804   PO2ART 69 (L) 07/11/2020 0804   HCO3 27.5 07/11/2020 0808   TCO2 24 12/05/2022 2240   O2SAT 71.0 07/11/2020 0808     Coagulation Profile: No results for input(s): "INR", "PROTIME" in the last 168 hours.  Cardiac Enzymes: No results for input(s): "CKTOTAL", "CKMB", "CKMBINDEX", "TROPONINI" in the last 168 hours.  HbA1C: Hemoglobin A1C  Date/Time Value Ref Range Status  03/21/2022 11:05 AM 6.4 (A) 4.0 - 5.6 % Final   Hgb A1c MFr Bld  Date/Time Value Ref Range Status  11/24/2021 03:25 AM 6.8 (H) 4.8 - 5.6 % Final    Comment:    (NOTE) Pre diabetes:          5.7%-6.4%  Diabetes:              >6.4%  Glycemic control for   <7.0% adults with diabetes   02/24/2020 01:52 PM 5.9 4.6 - 6.5 % Final    Comment:    Glycemic Control Guidelines for People with Diabetes:Non Diabetic:  <6%Goal of Therapy: <7%Additional Action Suggested:  >8%     CBG: Recent Labs  Lab 12/06/22 0802  GLUCAP 253*    Review of Systems:   10 point review of system taken, please see HPI for positives and negatives.  + PND, cough with gray sputum.  She notes she has been feeling worse as day after Christmas.  She is now positive for RSV   Past Medical History:  She,  has a past  medical history of Abdominal pain, Arthritis, Asymmetric septal hypertrophy (HCC), Atrial fibrillation (HCC), CAD (coronary artery disease), Cataract, Fatty liver, Gallstones, GERD (gastroesophageal reflux disease), Hepatitis, Hernia, hiatal, Hyperlipidemia, Hypertension, Obesity, and Sleep apnea.   Surgical History:   Past Surgical History:  Procedure Laterality Date   APPENDECTOMY  2004   CARDIOVERSION N/A 01/03/2022   Procedure: CARDIOVERSION;  Surgeon: Sande Rives, MD;  [facility] ENDOSCOPY;  Service: Cardiovascular;  Laterality: N/A;   CATARACT EXTRACTION W/ INTRAOCULAR LENS IMPLANT     both eyes   CHOLECYSTECTOMY  LEFT HEART CATH AND CORONARY ANGIOGRAPHY N/A 03/19/2018   Procedure: LEFT HEART CATH AND CORONARY ANGIOGRAPHY;  Surgeon: Kathleene Hazel, MD;  Location: [facility] INVASIVE CV LAB;  Service: Cardiovascular;  Laterality: N/A;   NM MYOCAR PERF WALL MOTION  08/15/04   No ischemia   RETINAL DETACHMENT SURGERY Bilateral    remains with slight hazy vision with lights   RIGHT/LEFT HEART CATH AND CORONARY ANGIOGRAPHY N/A 07/11/2020   Procedure: RIGHT/LEFT HEART CATH AND CORONARY ANGIOGRAPHY;  Surgeon: Tonny Bollman, MD;  Location: [facility] INVASIVE CV LAB;  Service: Cardiovascular;  Laterality: N/A;   TOTAL HIP ARTHROPLASTY Right 08/04/2014   Procedure: RIGHT TOTAL HIP ARTHROPLASTY ANTERIOR APPROACH;  Surgeon: Loanne Drilling, MD;  Location: [facility] ORS;  Service: Orthopedics;  Laterality: Right;   TOTAL HIP ARTHROPLASTY Left 01/05/2015   Procedure: LEFT TOTAL HIP ARTHROPLASTY ANTERIOR APPROACH;  Surgeon: Loanne Drilling, MD;  Location: [facility] ORS;  Service: Orthopedics;  Laterality: Left;   US ECHOCARDIOGRAPHY  06/25/2012   Moderate ASH,LV hyperdynamic,LA is mod. dilated,trace MR,TR,AI     Social History:   reports that she quit smoking about 34 years ago. Her smoking use included cigarettes. She has a 20.00 pack-year smoking history. She has never used smokeless tobacco. She reports  that she does not drink alcohol and does not use drugs.   Family History:  Her family history includes CVA in her mother; Cancer - Lung in her father; Diabetes in an other family member; Emphysema in her mother and sister; Esophageal cancer in an other family member; Hypertension in her mother; Lung cancer (age of onset: 53) in her father. There is no history of Colon cancer, Pancreatic cancer, Kidney disease, or Liver disease.   Allergies Allergies  Allergen Reactions   Codeine Nausea Only   Kenalog [Triamcinolone Acetonide] Other (See Comments)    Had A.fib after she received the injection."Steroid meds"   Kenalog [Triamcinolone] Rash    Other reaction(s): AFIB   Pantoprazole Rash    Other reaction(s): chronic upper GI pain all the way to back   Relafen [Nabumetone] Other (See Comments)    Edema    Epinephrine Palpitations   Penicillins Rash    40 years ago, injection site was red and swollen.  rash, facial/tongue/throat swelling, SOB or lightheadedness with hypotension: Yes Has patient had a PCN reaction causing immediate  Has patient had a PCN reaction causing severe rash involving mucus membranes or skin necrosis: NO Has patient had a PCN reaction that required hospitalization. No  Has patient had a PCN reaction occurring within the last 10 years: No If all of the above answers are "NO", then may proceed with Cephalosporin use.     Tramadol Other (See Comments)    Auditory halllucinations     Home Medications  Prior to Admission medications   Medication Sig Start Date End Date Taking? Authorizing Provider  ACETAMINOPHEN PO Take 650 mg by mouth daily as needed for moderate pain or headache.   Yes [provider]  atropine 1 % ophthalmic solution Place 1 drop into the right eye daily.   Yes [provider]  azithromycin (ZITHROMAX) 500 MG tablet Take 30-60 min prior to dental procedures. 03/26/22  Yes Duke, Roe Rutherford, PA  cholecalciferol (VITAMIN D3) 25  MCG (1000 UNIT) tablet Take 1,000 Units by mouth 2 (two) times daily.   Yes [provider]  Coenzyme Q10 (COQ10) 100 MG CAPS Take 100 mg by mouth daily.   Yes [provider]  ELIQUIS 5 MG TABS tablet TAKE 1 TABLET(5 MG) BY MOUTH TWICE DAILY Patient taking differently: Take 5 mg by mouth 2 (two) times daily. 12/04/22  Yes Minus Breeding, MD  ezetimibe (ZETIA) 10 MG tablet TAKE 1 TABLET(10 MG) BY MOUTH DAILY Patient taking differently: Take 10 mg by mouth daily. 09/17/22  Yes Minus Breeding, MD  furosemide (LASIX) 40 MG tablet TAKE 1 TABLET(40 MG) BY MOUTH DAILY Patient taking differently: Take 40 mg by mouth daily. 06/14/22  Yes Minus Breeding, MD  loteprednol (LOTEMAX) 0.5 % ophthalmic suspension Place 1 drop into both eyes daily as needed (imflammation).   Yes [provider]  metoprolol succinate (TOPROL XL) 50 MG 24 hr tablet Take 1 tablet (50 mg total) by mouth in the morning and at bedtime. 04/23/22  Yes Minus Breeding, MD  Multiple Vitamin (MULTIVITAMIN) tablet Take 1 tablet by mouth 2 (two) times a week.   Yes [provider]  nitroGLYCERIN (NITROSTAT) 0.4 MG SL tablet Place 1 tablet (0.4 mg total) under the tongue every 5 (five) minutes as needed for chest pain. 05/07/16  Yes Burtis Junes, NP  Probiotic Product (PROBIOTIC DAILY PO) Take 1 capsule by mouth daily.   Yes [provider]  Propylene Glycol (SYSTANE BALANCE) 0.6 % SOLN Place 1 drop into both eyes 2 (two) times daily as needed (dry eyes).   Yes [provider]  rosuvastatin (CRESTOR) 20 MG tablet Take 20 mg by mouth daily.   Yes [provider]  simethicone (MYLICON) 322 MG chewable tablet Chew 125 mg by mouth at bedtime.   Yes [provider]  Tiotropium Bromide Monohydrate (SPIRIVA RESPIMAT) 1.25 MCG/ACT AERS Inhale 2 puffs into the lungs daily. 08/10/22  Yes Maryjane Hurter, MD  levalbuterol [facility] HFA) 45 MCG/ACT inhaler Inhale 1-2 puffs into the  lungs every 6 (six) hours as needed for wheezing. Patient not taking: Reported on 12/06/2022 06/26/22   Maryjane Hurter, MD     Critical care time: Ferol Luz Adreonna Yontz ACNP Acute Care Nurse Practitioner [place] Please consult Amion 12/06/2022, 9:14 AM

## 2022-12-06 NOTE — Inpatient Diabetes Management (Signed)
Inpatient Diabetes Program Recommendations  AACE/ADA: New Consensus Statement on Inpatient Glycemic Control (2015)  Target Ranges:  Prepandial:   less than 140 mg/dL      Peak postprandial:   less than 180 mg/dL (1-2 hours)      Critically ill patients:  140 - 180 mg/dL   Lab Results  Component Value Date   GLUCAP 253 (H) 12/06/2022   HGBA1C 6.4 (A) 03/21/2022    Review of Glycemic Control  Latest Reference Range & Units 12/06/22 08:02  Glucose-Capillary 70 - 99 mg/dL 253 (H)   Diabetes history: Pre DM  Current orders for Inpatient glycemic control:  None  Solumedrol 40 mg Q12 hours  Inpatient Diabetes Program Recommendations:    Glucose trends increased in the 200 range on steroids  -  Start CBGs and Novolog 0-9 tid + hs scale  Thanks,  Tama Headings RN, MSN, BC-ADM Inpatient Diabetes Coordinator Team Pager [phone] (8a-5p)

## 2022-12-06 NOTE — Progress Notes (Signed)
ANTICOAGULATION CONSULT NOTE - Initial Consult  Pharmacy Consult for heparin Indication: chest pain/ACS and atrial fibrillation  Allergies  Allergen Reactions   Codeine Nausea Only   Kenalog [Triamcinolone Acetonide] Other (See Comments)    Had A.fib after she received the injection."Steroid meds"   Kenalog [Triamcinolone] Rash    Other reaction(s): AFIB   Pantoprazole Rash    Other reaction(s): chronic upper GI pain all the way to back   Relafen [Nabumetone] Other (See Comments)    Edema    Epinephrine Palpitations   Penicillins Rash    40 years ago, injection site was red and swollen.  rash, facial/tongue/throat swelling, SOB or lightheadedness with hypotension: Yes Has patient had a PCN reaction causing immediate  Has patient had a PCN reaction causing severe rash involving mucus membranes or skin necrosis: NO Has patient had a PCN reaction that required hospitalization. No  Has patient had a PCN reaction occurring within the last 10 years: No If all of the above answers are "NO", then may proceed with Cephalosporin use.     Tramadol Other (See Comments)    Auditory halllucinations    Patient Measurements: Height: 5\' 8"  (172.7 cm) Weight: 126.1 kg (278 lb) IBW/kg (Calculated) : 63.9 Heparin Dosing Weight: 95kg  Vital Signs: Temp: 97.5 F (36.4 C) (01/04 1341) Temp Source: Oral (01/04 1341) BP: 134/53 (01/04 1341) Pulse Rate: 96 (01/04 1341)  Labs: Recent Labs    12/05/22 2234 12/05/22 2240 12/06/22 0109 12/06/22 0737 12/06/22 0810 12/06/22 1452  HGB 13.9 14.6  --   --  12.7  --   HCT 42.9 43.0  --   --  39.3  --   PLT 209  --   --   --  188  --   APTT  --   --   --   --   --  144*  HEPARINUNFRC  --   --   --   --   --  >1.10*  CREATININE 1.06* 1.00  --   --  0.98  --   TROPONINIHS 24*  --  747* 1,360* 1,200*  --      Estimated Creatinine Clearance: 65.3 mL/min (by C-G formula based on SCr of 0.98 mg/dL).   Medical History: Past Medical History:   Diagnosis Date   Abdominal pain    Arthritis    osteoarthritis-hips,knees, back, hands   Asymmetric septal hypertrophy (HCC)    Atrial fibrillation (HCC)    CAD (coronary artery disease)    2 drug-eluting stents placed in the circumflex leading into a marginal.  May 2017.  [name].   catheterization on 03/19/2018, this showed patent stent in the proximal to mid left circumflex artery, 20% ostial left circumflex artery disease, 40% OM 3 disease, widely patent stent in the ostial D1, 20% ostial to proximal LAD disease, 20% mid LAD disease.   Cataract    corrective surgery done   Fatty liver    Gallstones    GERD (gastroesophageal reflux disease)    Hepatitis    hepatitis A; past hx.,many yrs ago ? food source   Hernia, hiatal    Hyperlipidemia    Hypertension    Obesity    Sleep apnea    CPAP    Assessment: 82yo female c/o SOB and cough, found to be positive for RSV and was in SVT on arrival to ED, known h/o Afib, initial troponin mildly elevated but now rising sharply >> to begin heparin; on Eliquis PTA for Afib,  last dose 1/3 9a.  HL > 1.1 due to effects of Eliquis. aPTT elevated at 144 seconds. Lab appears to have been drawn from R hand. Heparin infusing in L forearm.  Goal of Therapy:  Heparin level 0.3-0.7 units/ml aPTT 66-102 seconds Monitor platelets by anticoagulation protocol: Yes   Plan:  Hold heparin x 1 hour Resume heparin drip at 1300 units/hr at 6pm this evening Check aPTT 8 hours after drip resumed Monitor heparin level, aPTT (while Eliquis affects anti-Xa assay), and CBC.  Dimple Nanas, PharmD, BCPS 12/06/2022 4:38 PM

## 2022-12-06 NOTE — H&P (Addendum)
History and Physical    [name] [id number] DOB: June 01, 1941 DOA: 12/05/2022  PCP: Sueanne Margarita, DO  Patient coming from: home  I have personally briefly reviewed patient's old medical records in [place]  Chief Complaint:  sob Victoria Delgado x 3 days   HPI: Victoria Delgado is a 82 y.o. female with medical history significant of  PAF, GI bleed on eliquis, s/p TAVR, CAD s/p stents ,OSA on CPAP, HTN, HLD , GERD who presents to ED BIB EMS with acute worsening of SOB with noted hypoxemia(89% on ra) in the field.  Patient notes she has been ill with uri symptoms for over one week, however it progressed over the last 3 days. She notes that her sputum production increased and sputum was note to be opaque.  She note low grade fever but no chills, n/v/d/dysuria but did have concern for possible uti as on one occasion she noted scant blood in urine.   ED Course:  Patient on arrival vitals  afeb, bp 166/66, hr 94  rr 32 ,  Patient ED course complicated by  SVT which did not response to adenosine and patient was then started on amiodarone drip. Of note during ED course patient on amiodarone drip converted back to sinus. Patient on also on evaluation found to have COPD exacerbation.  Respiratory panel + RSV   EKG-SVT 195 , RBBB/LAFB with rate related st depression  Labs: Wbc 9.4, hgb 13.9,  plt 209 Mag 2.1  N a 132, K 3.2,glu 182 , cr 1.06 CE 24,747 Bnp 446(609)  Cxr  IMPRESSION: Peribronchial thickening may be bronchitic or congestive. Tx solumedrol , LR  Case discussed with cardiology who recommend continue on amiodarone drip with f/u in am  Review of Systems: As per HPI otherwise 10 point review of systems negative.   Past Medical History:  Diagnosis Date   Abdominal pain    Arthritis    osteoarthritis-hips,knees, back, hands   Asymmetric septal hypertrophy (HCC)    Atrial fibrillation (HCC)    CAD (coronary artery disease)    2 drug-eluting stents placed in the circumflex  leading into a marginal.  May 2017.  [name].   catheterization on 03/19/2018, this showed patent stent in the proximal to mid left circumflex artery, 20% ostial left circumflex artery disease, 40% OM 3 disease, widely patent stent in the ostial D1, 20% ostial to proximal LAD disease, 20% mid LAD disease.   Cataract    corrective surgery done   Fatty liver    Gallstones    GERD (gastroesophageal reflux disease)    Hepatitis    hepatitis A; past hx.,many yrs ago ? food source   Hernia, hiatal    Hyperlipidemia    Hypertension    Obesity    Sleep apnea    CPAP    Past Surgical History:  Procedure Laterality Date   APPENDECTOMY  2004   CARDIOVERSION N/A 01/03/2022   Procedure: CARDIOVERSION;  Surgeon: Geralynn Rile, MD;  Location: [facility];  Service: Cardiovascular;  Laterality: N/A;   CATARACT EXTRACTION W/ INTRAOCULAR LENS IMPLANT     both eyes   CHOLECYSTECTOMY     LEFT HEART CATH AND CORONARY ANGIOGRAPHY N/A 03/19/2018   Procedure: LEFT HEART CATH AND CORONARY ANGIOGRAPHY;  Surgeon: Burnell Blanks, MD;  Location: [facility];  Service: Cardiovascular;  Laterality: N/A;   NM MYOCAR PERF WALL MOTION  08/15/04   No ischemia   RETINAL DETACHMENT SURGERY Bilateral  remains with slight hazy vision with lights   RIGHT/LEFT HEART CATH AND CORONARY ANGIOGRAPHY N/A 07/11/2020   Procedure: RIGHT/LEFT HEART CATH AND CORONARY ANGIOGRAPHY;  Surgeon: Sherren Mocha, MD;  Location: [facility];  Service: Cardiovascular;  Laterality: N/A;   TOTAL HIP ARTHROPLASTY Right 08/04/2014   Procedure: RIGHT TOTAL HIP ARTHROPLASTY ANTERIOR APPROACH;  Surgeon: Gearlean Alf, MD;  Location: [facility];  Service: Orthopedics;  Laterality: Right;   TOTAL HIP ARTHROPLASTY Left 01/05/2015   Procedure: LEFT TOTAL HIP ARTHROPLASTY ANTERIOR APPROACH;  Surgeon: Gearlean Alf, MD;  Location: [facility];  Service: Orthopedics;  Laterality: Left;   US ECHOCARDIOGRAPHY  06/25/2012   Moderate  ASH,LV hyperdynamic,LA is mod. dilated,trace MR,TR,AI     reports that she quit smoking about 34 years ago. Her smoking use included cigarettes. She has a 20.00 pack-year smoking history. She has never used smokeless tobacco. She reports that she does not drink alcohol and does not use drugs.  Allergies  Allergen Reactions   Codeine Nausea Only   Kenalog [Triamcinolone Acetonide] Other (See Comments)    Had A.fib after she received the injection."Steroid meds"   Kenalog [Triamcinolone] Rash    Other reaction(s): AFIB   Pantoprazole Rash    Other reaction(s): chronic upper GI pain all the way to back   Relafen [Nabumetone] Other (See Comments)    Edema    Epinephrine Palpitations   Penicillins Rash    40 years ago, injection site was red and swollen.  rash, facial/tongue/throat swelling, SOB or lightheadedness with hypotension: Yes Has patient had a PCN reaction causing immediate  Has patient had a PCN reaction causing severe rash involving mucus membranes or skin necrosis: NO Has patient had a PCN reaction that required hospitalization. No  Has patient had a PCN reaction occurring within the last 10 years: No If all of the above answers are "NO", then may proceed with Cephalosporin use.     Tramadol Other (See Comments)    Auditory halllucinations    Family History  Problem Relation Age of Onset   Hypertension Mother    CVA Mother    Emphysema Mother        smoked   Lung cancer Father 21       asbestos exposure   Cancer - Lung Father        smoked and asbestos exp   Emphysema Sister        smoked   Esophageal cancer Other        nephew   Diabetes Other        nephew   Colon cancer Neg Hx    Pancreatic cancer Neg Hx    Kidney disease Neg Hx    Liver disease Neg Hx     Prior to Admission medications   Medication Sig Start Date End Date Taking? Authorizing Provider  acetaminophen (TYLENOL) 500 MG tablet Take 1,000 mg by mouth every 6 (six) hours as needed for  moderate pain or headache.    [provider]  alum & mag hydroxide-simeth (GELUSIL) 200-200-25 MG chewable tablet Taking 2-4 chewables by mouth daily    [provider]  azithromycin (ZITHROMAX) 500 MG tablet Take 30-60 min prior to dental procedures. 03/26/22   [facility], Tami Lin, PA  cholecalciferol (VITAMIN D3) 25 MCG (1000 UNIT) tablet Take 1,000 Units by mouth 2 (two) times daily.    [provider]  Coenzyme Q10 (COQ10) 100 MG CAPS Take 100 mg by mouth daily.  [provider]  ELIQUIS 5 MG TABS tablet TAKE 1 TABLET(5 MG) BY MOUTH TWICE DAILY 12/04/22   Minus Breeding, MD  ezetimibe (ZETIA) 10 MG tablet TAKE 1 TABLET(10 MG) BY MOUTH DAILY 09/17/22   Minus Breeding, MD  furosemide (LASIX) 40 MG tablet TAKE 1 TABLET(40 MG) BY MOUTH DAILY 06/14/22   Minus Breeding, MD  levalbuterol [facility] HFA) 45 MCG/ACT inhaler Inhale 1-2 puffs into the lungs every 6 (six) hours as needed for wheezing. 06/26/22   Maryjane Hurter, MD  loteprednol (LOTEMAX) 0.5 % ophthalmic suspension Place 1 drop into both eyes daily as needed (imflammation).    [provider]  metoprolol succinate (TOPROL XL) 50 MG 24 hr tablet Take 1 tablet (50 mg total) by mouth in the morning and at bedtime. 04/23/22   Minus Breeding, MD  Multiple Vitamin (MULTIVITAMIN) tablet Take 1 tablet by mouth 2 (two) times a week.    [provider]  nitroGLYCERIN (NITROSTAT) 0.4 MG SL tablet Place 1 tablet (0.4 mg total) under the tongue every 5 (five) minutes as needed for chest pain. 05/07/16   Burtis Junes, NP  omeprazole (PRILOSEC) 10 MG capsule TAKE 2 CAPSULES(20 MG) BY MOUTH DAILY Patient taking differently: Take 10 mg by mouth daily. 07/17/22   Irene Shipper, MD  Probiotic Product (PROBIOTIC DAILY PO) Take 1 capsule by mouth daily.    [provider]  Propylene Glycol (SYSTANE BALANCE) 0.6 % SOLN Place 1 drop into both eyes 2 (two) times daily as needed (dry eyes).     [provider]  rosuvastatin (CRESTOR) 20 MG tablet Take 20 mg by mouth daily.    [provider]  Tiotropium Bromide Monohydrate (SPIRIVA RESPIMAT) 1.25 MCG/ACT AERS Inhale 2 puffs into the lungs daily. 08/10/22   Maryjane Hurter, MD    Physical Exam: Vitals:   12/05/22 2315 12/05/22 2330 12/05/22 2345 12/06/22 0000  BP: (!) 130/90 130/87 (!) 105/43 (!) 99/54  Pulse: (!) 113 (!) 108 (!) 103 (!) 103  Resp: (!) 23 17 20  (!) 25  Temp:      TempSrc:      SpO2: 96% 96% 98% 96%    Constitutional: NAD, calm, comfortable Vitals:   12/05/22 2315 12/05/22 2330 12/05/22 2345 12/06/22 0000  BP: (!) 130/90 130/87 (!) 105/43 (!) 99/54  Pulse: (!) 113 (!) 108 (!) 103 (!) 103  Resp: (!) 23 17 20  (!) 25  Temp:      TempSrc:      SpO2: 96% 96% 98% 96%   Eyes: PERRL, lids and conjunctivae normal ENMT: Mucous membranes are moist. Posterior pharynx clear of any exudate or lesions.Normal dentition.  Neck: normal, supple, no masses, no thyromegaly Respiratory: b/l diffuse wheezes and rhonchi . increase respiratory effort. Some accessory muscle use.  Cardiovascular: Regular rate and rhythm, no murmurs / rubs / gallops. No extremity edema. 2+ pedal pulses.  Abdomen: no tenderness, no masses palpated. No hepatosplenomegaly. Bowel sounds positive. obese Musculoskeletal: no clubbing / cyanosis. No joint deformity upper and lower extremities. Good ROM, no contractures. Normal muscle tone.  Skin: no rashes, lesions, ulcers. No induration Neurologic: CN 2-12 grossly intact. Sensation intact,  Strength 5/5 in all 4.  Psychiatric: Normal judgment and insight. Alert and oriented x 3. Normal mood.    Labs on Admission: I have personally reviewed following labs and imaging studies  CBC: Recent Labs  Lab 12/05/22 2234 12/05/22 2240  WBC 9.4  --   NEUTROABS 6.1  --  HGB 13.9 14.6  HCT 42.9 43.0  MCV 87.9  --   PLT 209  --    Basic Metabolic Panel: Recent Labs  Lab 12/05/22 2234  12/05/22 2240  NA 132* 137  K 3.2* 3.5  CL 99 101  CO2 22  --   GLUCOSE 183* 182*  BUN 14 15  CREATININE 1.06* 1.00  CALCIUM 8.5*  --   MG 2.1  --    GFR: CrCl cannot be calculated (Unknown ideal weight.). Liver Function Tests: Recent Labs  Lab 12/05/22 2234  AST 35  ALT 20  ALKPHOS 76  BILITOT 0.9  PROT 6.8  ALBUMIN 3.7   No results for input(s): "LIPASE", "AMYLASE" in the last 168 hours. No results for input(s): "AMMONIA" in the last 168 hours. Coagulation Profile: No results for input(s): "INR", "PROTIME" in the last 168 hours. Cardiac Enzymes: No results for input(s): "CKTOTAL", "CKMB", "CKMBINDEX", "TROPONINI" in the last 168 hours. BNP (last 3 results) No results for input(s): "PROBNP" in the last 8760 hours. HbA1C: No results for input(s): "HGBA1C" in the last 72 hours. CBG: No results for input(s): "GLUCAP" in the last 168 hours. Lipid Profile: No results for input(s): "CHOL", "HDL", "LDLCALC", "TRIG", "CHOLHDL", "LDLDIRECT" in the last 72 hours. Thyroid Function Tests: No results for input(s): "TSH", "T4TOTAL", "FREET4", "T3FREE", "THYROIDAB" in the last 72 hours. Anemia Panel: No results for input(s): "VITAMINB12", "FOLATE", "FERRITIN", "TIBC", "IRON", "RETICCTPCT" in the last 72 hours. Urine analysis:    Component Value Date/Time   COLORURINE RED (A) 12/17/2021 1715   APPEARANCEUR TURBID (A) 12/17/2021 1715   LABSPEC  12/17/2021 1715    TEST NOT REPORTED DUE TO COLOR INTERFERENCE OF URINE PIGMENT   PHURINE  12/17/2021 1715    TEST NOT REPORTED DUE TO COLOR INTERFERENCE OF URINE PIGMENT   GLUCOSEU (A) 12/17/2021 1715    TEST NOT REPORTED DUE TO COLOR INTERFERENCE OF URINE PIGMENT   HGBUR (A) 12/17/2021 1715    TEST NOT REPORTED DUE TO COLOR INTERFERENCE OF URINE PIGMENT   BILIRUBINUR (A) 12/17/2021 1715    TEST NOT REPORTED DUE TO COLOR INTERFERENCE OF URINE PIGMENT   BILIRUBINUR neg 01/25/2015 0920   KETONESUR (A) 12/17/2021 1715    TEST NOT  REPORTED DUE TO COLOR INTERFERENCE OF URINE PIGMENT   PROTEINUR (A) 12/17/2021 1715    TEST NOT REPORTED DUE TO COLOR INTERFERENCE OF URINE PIGMENT   UROBILINOGEN negative 01/25/2015 0920   UROBILINOGEN 1.0 12/27/2014 0850   NITRITE (A) 12/17/2021 1715    TEST NOT REPORTED DUE TO COLOR INTERFERENCE OF URINE PIGMENT   LEUKOCYTESUR (A) 12/17/2021 1715    TEST NOT REPORTED DUE TO COLOR INTERFERENCE OF URINE PIGMENT    Radiological Exams on Admission: DG Chest 1 View  Result Date: 12/05/2022 CLINICAL DATA:  Shortness of breath. EXAM: CHEST  1 VIEW COMPARISON:  Radiograph 11/24/2021 FINDINGS: Stable heart size and mediastinal contours. Peribronchial thickening. No focal airspace disease. No pleural effusion or pneumothorax. No acute osseous findings. IMPRESSION: Peribronchial thickening may be bronchitic or congestive. Electronically Signed   By: Keith Rake M.D.   On: 12/05/2022 22:57    EKG: Independently reviewed. 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 - ipratropium/levo-albuterol due to patient sensitivity to albuterol nebs standing and prn  -resume chronic inhal -CPAP qhs -pulmonary toilet  -wean O2 as able     NSTEMI ? Type II r/o ACS -hx of CAD s/p stents  -in setting of acute hypoxic respiratory failure/ SVT  -treat underlying cause  -await cardiology final rec  -echo to be complete  -continue to cycle ce  24- 700's - start heparin drip  -plavix as pt has allergy to asa -resume  metoprolol, statin,zetia   PAF -continue metoprolol -resume eliquis once of heparin drip    s/p TAVR  OSA  -on CPAP   HTN -resume home regimen . Metoprolol     HLD  -continue station   GERD -ppi    Hypokalemia -replete prn   DVT prophylaxis: .heparin Code Status: full/ as discussed per patient wishes in event of cardiac arrest  Family Communication:  Annalisia, Ingber (Spouse) [phone] (Mobile)  Disposition Plan: patient  expected to be admitted greater than 2 midnights  Consults called: cardiology: case discusse dwith DR Suleiman-rec heparin drip/echo have day team follow   Admission status: progressive care    Clance Boll MD [facility]   If 7PM-7AM, please contact night-coverage [email or web address] Password [email or web address]  12/06/2022, 12:21 AM

## 2022-12-06 NOTE — Progress Notes (Signed)
PROGRESS NOTE  ROOP MARLAR  DOB: 1941/11/25  PCP: Sueanne Margarita, DO [id number]  DOA: 12/05/2022  LOS: 0 days  Hospital Day: 2  Brief narrative: Victoria Delgado is a 82 y.o. female with PMH significant for obesity, OSA on CPAP, HTN, HLD, CAD/stents, AS s/p TAVR, paroxysmal A-fib, GI bleed on Eliquis, GERD 1/3, patient presented to the ED with complaint of cough, shortness of breath for 1 week which progressed significantly in the last 3 days. EMS noted O2 sat low at 89% on room air.  She was wheezing, given bronchodilators, started on supplemental oxygen and brought to the ED. She also had low-grade fever, dysuria, N/V/D.  There was possible concern of UTI.  In the ED, patient was afebrile, heart rate 113, breathing on 2 L oxygen by nasal cannula. While in the ED, she had an episode of SVT with heart rate elevated to 198, did not respond to adenosine and hence patient was started on amiodarone drip.  She subsequently converted to normal sinus rhythm. Respiratory panel positive for RSV WBC count 9.4 sodium 132, potassium 3.2, BNP 447, troponin 24 with subsequently rose to 747. Chest x-ray showed peribronchial thickening may be bronchitic or congestive.  Patient was started on treatment for COPD exacerbation.  Patient ED course complicated by  SVT with heart rate up to 198 which did not response to adenosine.  Cardiology recommended amiodarone drip after which rhythm converted to normal sinus. Admitted to [facility]  Subjective: Patient was seen and examined this morning.  Pleasant elderly Caucasian female.  Propped up in bed.  On 3 L oxygen by nasal cannula.  Coughing and deep breathing.  Getting bedside echocardiogram.  Quit smoking 35 years ago.  Lives at home with husband. Chart reviewed This morning, afebrile, heart rate in 80s, blood pressure 112/65 Pending BMP  Assessment and plan: Acute exacerbation of COPD  Acute respiratory failure with hypoxia  RSV bronchitis  Presented from  home with cough, shortness of breath worsening for a week  RSV positive  Clinical and COPD exacerbation.   Chest x-ray showed bronchitis changes.   Currently on IV Solu-Medrol 40 mg twice daily, doxycycline, nebulizers. Switch from albuterol to Xopenex  SVT History of paroxysmal A-fib While in the ED, patient had SVT with heart rate up to 198 which did not response to adenosine.  Cardiology recommended amiodarone drip after which rhythm converted to normal sinus. Amiodarone drip was subsequently held. Continue metoprolol PTA on Eliquis.  Currently on heparin drip.  Resume Eliquis once off heparin drip  Elevated troponin H/o CAD/stents HLD Troponin was initially 24, later elevated to 747 probably after SVT.  Likely demand ischemia due to severe tachycardia. Heparin drip was initiated in the ED Allergy to aspirin.  Plavix started Continue metoprolol, statin, Zetia Recent Labs    12/05/22 2234 12/06/22 0109 12/06/22 0737 12/06/22 0810  TROPONINIHS 24* 747* 1,360* 1,200*    Chronic diastolic CHF Essential hypertension AS s/p TAVR PTA on Toprol 50 mg daily, Lasix 40 mg daily Currently resumed on both.  Hypokalemia Potassium level improved with replacement. Recent Labs  Lab 12/05/22 2234 12/05/22 2240 12/06/22 0810  K 3.2* 3.5 3.5  MG 2.1  --  2.0  PHOS  --   --  3.4   Morbid obesity  -Body mass index is 42.27 kg/m. Patient has been advised to make an attempt to improve diet and exercise patterns to aid in weight loss.  OSA  on CPAP    GERD Continue PPI  Mobility: PT eval  Goals of care   Code Status: Full Code    Scheduled Meds:  cholecalciferol  1,000 Units Oral BID   clopidogrel  75 mg Oral Daily   doxycycline  100 mg Oral Q12H   ezetimibe  10 mg Oral Daily   famotidine  20 mg Oral Daily   furosemide  40 mg Oral Daily   ipratropium  0.5 mg Nebulization Q6H   levalbuterol  0.63 mg Nebulization Q6H   methylPREDNISolone (SOLU-MEDROL) injection  40  mg Intravenous Q12H   metoprolol succinate  50 mg Oral Daily   multivitamin with minerals  1 tablet Oral Once per day on Mon Thu   rosuvastatin  20 mg Oral Daily   umeclidinium bromide  1 puff Inhalation Daily    PRN meds: acetaminophen **OR** acetaminophen, ondansetron **OR** ondansetron (ZOFRAN) IV   Infusions:   heparin 1,500 Units/hr (12/06/22 0709)    Skin assessment:     Nutritional status:  Body mass index is 42.27 kg/m.          Diet:  Diet Order             Diet heart healthy/carb modified Room service appropriate? Yes; Fluid consistency: Thin  Diet effective now                  DVT prophylaxis: Heparin drip   Antimicrobials: Doxycycline Fluid: None Consultants: Cardiology Family Communication: None at bedside  Status is: Inpatient  Continue inhospital care because: Respiratory failure Level of care: Progressive   Dispo: The patient is from: Home              Anticipated d/c is to: Pending clinical course, pending PT eval              Patient currently is not medically stable to d/c.   Difficult to place patient No    Antimicrobials: Anti-infectives (From admission, onward)    Start     Dose/Rate Route Frequency Ordered Stop   12/06/22 1000  doxycycline (VIBRA-TABS) tablet 100 mg        100 mg Oral Every 12 hours 12/06/22 0535 12/11/22 0959       Objective: Vitals:   12/06/22 0822 12/06/22 0945  BP: 112/65 109/62  Pulse: 84 85  Resp:  (!) 23  Temp:  98.2 F (36.8 C)  SpO2:  96%    Intake/Output Summary (Last 24 hours) at 12/06/2022 1153 Last data filed at 12/06/2022 0756 Gross per 24 hour  Intake --  Output 401 ml  Net -401 ml   Filed Weights   12/06/22 0600  Weight: 126.1 kg   Weight change:  Body mass index is 42.27 kg/m.   Physical Exam: General exam: Pleasant, elderly Caucasian female. Skin: No rashes, lesions or ulcers. HEENT: Atraumatic, normocephalic, no obvious bleeding Lungs: Mild end expiratory wheezing  bilaterally CVS: Regular rate and rhythm, no murmur GI/Abd soft, nontender, nondistended, bowel sound present CNS: Alert, awake, oriented x 3 Psychiatry: Mood appropriate Extremities: No pedal edema, no calf tenderness  Data Review: I have personally reviewed the laboratory data and studies available.  F/u labs ordered Unresulted Labs (From admission, onward)     Start     Ordered   12/07/22 0500  Comprehensive metabolic panel  Tomorrow morning,   R        12/06/22 0535   12/07/22 0500  CBC  Tomorrow morning,   R        12/06/22 0535  12/07/22 0500  Heparin level (unfractionated)  Daily,   R     See Hyperspace for full Linked Orders Report.   12/06/22 0627   12/07/22 0500  APTT  Daily,   R     See Hyperspace for full Linked Orders Report.   12/06/22 0627   12/07/22 0500  CBC  Daily,   R     See Hyperspace for full Linked Orders Report.   12/06/22 0627   12/06/22 1500  Heparin level (unfractionated)  Once-Timed,   TIMED       See Hyperspace for full Linked Orders Report.   12/06/22 0627   12/06/22 1500  APTT  Once-Timed,   TIMED       See Hyperspace for full Linked Orders Report.   12/06/22 0627   12/06/22 0630  Blood gas, arterial  Once,   R        12/06/22 0629   12/06/22 0535  Expectorated Sputum Assessment w Gram Stain, Rflx to Resp Cult  Once,   R        12/06/22 0535   12/05/22 2331  Urine Culture  Once,   URGENT       Question:  Indication  Answer:  Dysuria   12/05/22 2330            Total time spent in review of labs and imaging, patient evaluation, formulation of plan, documentation and communication with family 75 minutes  Signed, Terrilee Croak, MD [facility] 12/06/2022

## 2022-12-06 NOTE — Progress Notes (Signed)
Echocardiogram 2D Echocardiogram has been performed.  Victoria Delgado 12/06/2022, 11:41 AM

## 2022-12-06 NOTE — Consult Note (Addendum)
Cardiology Consultation   Patient ID: Victoria Delgado MRN: [id number]; DOB: 02-04-1941  Admit date: 12/05/2022 Date of Consult: 12/06/2022  PCP:  Sueanne Margarita, [place] Providers Cardiologist:  Minus Breeding, MD        Patient Profile:   Victoria Delgado is a 82 y.o. 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 Marcello Moores.  History of Present Illness:   Victoria Delgado presented to the emergency department on the evening of 1/3 with [facility] secondary to acute shortness of breath.  Patient reported that she has been sick with upper respiratory infection for the last week but had significant progression of dyspnea/cough over the past 3 days.  Patient noted to be hypoxic with an O2 saturation of 89% with EMS.  While in the emergency department, patient developed rapid HR that was not responsive to adenosine. Around the same time, she says she experienced brief left arm pain. She subsequently received amiodarone and converted back to a normal sinus rhythm.  ED workup notable for potassium of 3.2, BNP 446, RSV positive. Patient reports that prior to developing URI symptoms, she had some underlying shortness of breath that to her seemed stable/consistent with what she reported at her most recent cardiology office visit. Denies orthopnea.   Regarding her cardiac history, patient first noted to have atrial fibrillation back in 2008.  She was also noted to have asymmetrical septal hypertrophy that has since been followed conservatively.  In May 2017 patient was hospitalized at [facility] after waking up with left arm pain, she had positive troponin and underwent PCI to mid left circumflex and first diagonal.  Patient hospitalized again in April 2019 with chest pain and positive cardiac enzymes.  She  underwent cardiac catheterization which found patent proximal to mid left circumflex stent, 20% ostial left circumflex artery disease, 40% OM 3 disease, widely patent stent in ostial diagonal, 20% ostial to proximal LAD disease, 20% mid LAD disease.  This catheterization confirmed moderate aortic stenosis with a mean gradient of 26.7 mmHg.  A follow-up echocardiogram noted that her aortic stenosis had progressed, severe to critical.  She underwent TAVR at [facility] (received 23 mm SAPIEN S3).  Patient hospitalized in December 2022 with COVID, had atrial fibrillation with RVR.  This was managed with amiodarone.  Patient underwent successful cardioversion 01/03/2022 and has maintained sinus rhythm since.   Past Medical History:  Diagnosis Date   Abdominal pain    Arthritis    osteoarthritis-hips,knees, back, hands   Asymmetric septal hypertrophy (HCC)    Atrial fibrillation (HCC)    CAD (coronary artery disease)    2 drug-eluting stents placed in the circumflex leading into a marginal.  May 2017.  [name].   catheterization on 03/19/2018, this showed patent stent in the proximal to mid left circumflex artery, 20% ostial left circumflex artery disease, 40% OM 3 disease, widely patent stent in the ostial D1, 20% ostial to proximal LAD disease, 20% mid LAD disease.   Cataract    corrective surgery done   Fatty liver    Gallstones    GERD (gastroesophageal reflux disease)    Hepatitis    hepatitis A; past hx.,many yrs ago ? food source   Hernia, hiatal    Hyperlipidemia    Hypertension  Obesity    Sleep apnea    CPAP    Past Surgical History:  Procedure Laterality Date   APPENDECTOMY  2004   CARDIOVERSION N/A 01/03/2022   Procedure: CARDIOVERSION;  Surgeon: Geralynn Rile, MD;  Location: [place];  Service: Cardiovascular;  Laterality: N/A;   CATARACT EXTRACTION W/ INTRAOCULAR LENS IMPLANT     both eyes   CHOLECYSTECTOMY     LEFT HEART CATH AND CORONARY ANGIOGRAPHY N/A 03/19/2018    Procedure: LEFT HEART CATH AND CORONARY ANGIOGRAPHY;  Surgeon: Burnell Blanks, MD;  Location: [place] [facility];  Service: Cardiovascular;  Laterality: N/A;   NM MYOCAR PERF WALL MOTION  08/15/04   No ischemia   RETINAL DETACHMENT SURGERY Bilateral    remains with slight hazy vision with lights   RIGHT/LEFT HEART CATH AND CORONARY ANGIOGRAPHY N/A 07/11/2020   Procedure: RIGHT/LEFT HEART CATH AND CORONARY ANGIOGRAPHY;  Surgeon: Sherren Mocha, MD;  Location: [place] [facility];  Service: Cardiovascular;  Laterality: N/A;   TOTAL HIP ARTHROPLASTY Right 08/04/2014   Procedure: RIGHT TOTAL HIP ARTHROPLASTY ANTERIOR APPROACH;  Surgeon: Gearlean Alf, MD;  Location: [facility];  Service: Orthopedics;  Laterality: Right;   TOTAL HIP ARTHROPLASTY Left 01/05/2015   Procedure: LEFT TOTAL HIP ARTHROPLASTY ANTERIOR APPROACH;  Surgeon: Gearlean Alf, MD;  Location: [facility];  Service: Orthopedics;  Laterality: Left;   US ECHOCARDIOGRAPHY  06/25/2012   Moderate ASH,LV hyperdynamic,LA is mod. dilated,trace MR,TR,AI     Home Medications:  Prior to Admission medications   Medication Sig Start Date End Date Taking? Authorizing Provider  ACETAMINOPHEN PO Take 650 mg by mouth daily as needed for moderate pain or headache.   Yes [provider]  atropine 1 % ophthalmic solution Place 1 drop into the right eye daily.   Yes [provider]  azithromycin (ZITHROMAX) 500 MG tablet Take 30-60 min prior to dental procedures. 03/26/22  Yes Duke, Tami Lin, PA  cholecalciferol (VITAMIN D3) 25 MCG (1000 UNIT) tablet Take 1,000 Units by mouth 2 (two) times daily.   Yes [provider]  Coenzyme Q10 (COQ10) 100 MG CAPS Take 100 mg by mouth daily.   Yes [provider]  ELIQUIS 5 MG TABS tablet TAKE 1 TABLET(5 MG) BY MOUTH TWICE DAILY Patient taking differently: Take 5 mg by mouth 2 (two) times daily. 12/04/22  Yes Minus Breeding, MD  ezetimibe (ZETIA) 10 MG tablet TAKE 1 TABLET(10  MG) BY MOUTH DAILY Patient taking differently: Take 10 mg by mouth daily. 09/17/22  Yes Minus Breeding, MD  furosemide (LASIX) 40 MG tablet TAKE 1 TABLET(40 MG) BY MOUTH DAILY Patient taking differently: Take 40 mg by mouth daily. 06/14/22  Yes Minus Breeding, MD  loteprednol (LOTEMAX) 0.5 % ophthalmic suspension Place 1 drop into both eyes daily as needed (imflammation).   Yes [provider]  metoprolol succinate (TOPROL XL) 50 MG 24 hr tablet Take 1 tablet (50 mg total) by mouth in the morning and at bedtime. 04/23/22  Yes Minus Breeding, MD  Multiple Vitamin (MULTIVITAMIN) tablet Take 1 tablet by mouth 2 (two) times a week.   Yes [provider]  nitroGLYCERIN (NITROSTAT) 0.4 MG SL tablet Place 1 tablet (0.4 mg total) under the tongue every 5 (five) minutes as needed for chest pain. 05/07/16  Yes Burtis Junes, NP  Probiotic Product (PROBIOTIC DAILY PO) Take 1 capsule by mouth daily.   Yes [provider]  Propylene Glycol (SYSTANE BALANCE) 0.6 % SOLN Place  1 drop into both eyes 2 (two) times daily as needed (dry eyes).   Yes [provider]  rosuvastatin (CRESTOR) 20 MG tablet Take 20 mg by mouth daily.   Yes [provider]  simethicone (MYLICON) 325 MG chewable tablet Chew 125 mg by mouth at bedtime.   Yes [provider]  Tiotropium Bromide Monohydrate (SPIRIVA RESPIMAT) 1.25 MCG/ACT AERS Inhale 2 puffs into the lungs daily. 08/10/22  Yes Maryjane Hurter, MD  levalbuterol [facility] HFA) 45 MCG/ACT inhaler Inhale 1-2 puffs into the lungs every 6 (six) hours as needed for wheezing. Patient not taking: Reported on 12/06/2022 06/26/22   Maryjane Hurter, MD    Inpatient Medications: Scheduled Meds:  cholecalciferol  1,000 Units Oral BID   clopidogrel  75 mg Oral Daily   doxycycline  100 mg Oral Q12H   ezetimibe  10 mg Oral Daily   famotidine  20 mg Oral Daily   furosemide  40 mg Oral Daily   ipratropium  0.5 mg Nebulization Q6H    levalbuterol  0.63 mg Nebulization Q6H   methylPREDNISolone (SOLU-MEDROL) injection  40 mg Intravenous Q12H   metoprolol succinate  50 mg Oral Daily   multivitamin with minerals  1 tablet Oral Once per day on Mon Thu   rosuvastatin  20 mg Oral Daily   umeclidinium bromide  1 puff Inhalation Daily   Continuous Infusions:  heparin 1,500 Units/hr (12/06/22 0709)   PRN Meds: acetaminophen **OR** acetaminophen, ondansetron **OR** ondansetron (ZOFRAN) IV  Allergies:    Allergies  Allergen Reactions   Codeine Nausea Only   Kenalog [Triamcinolone Acetonide] Other (See Comments)    Had A.fib after she received the injection."Steroid meds"   Kenalog [Triamcinolone] Rash    Other reaction(s): AFIB   Pantoprazole Rash    Other reaction(s): chronic upper GI pain all the way to back   Relafen [Nabumetone] Other (See Comments)    Edema    Epinephrine Palpitations   Penicillins Rash    40 years ago, injection site was red and swollen.  rash, facial/tongue/throat swelling, SOB or lightheadedness with hypotension: Yes Has patient had a PCN reaction causing immediate  Has patient had a PCN reaction causing severe rash involving mucus membranes or skin necrosis: NO Has patient had a PCN reaction that required hospitalization. No  Has patient had a PCN reaction occurring within the last 10 years: No If all of the above answers are "NO", then may proceed with Cephalosporin use.     Tramadol Other (See Comments)    Auditory halllucinations    Social History:   Social History   Socioeconomic History   Marital status: Married    Spouse name: Not on file   Number of children: 2   Years of education: Not on file   Highest education level: Not on file  Occupational History    Employer: OTHER  Tobacco Use   Smoking status: Former    Packs/day: 1.00    Years: 20.00    Total pack years: 20.00    Types: Cigarettes    Quit date: 12/03/1988    Years since quitting: 34.0   Smokeless tobacco:  Never  Vaping Use   Vaping Use: Never used  Substance and Sexual Activity   Alcohol use: No    Alcohol/week: 0.0 standard drinks of alcohol   Drug use: No   Sexual activity: Yes  Other Topics Concern   Not on file  Social History Narrative   Two grandchildren.  Social Determinants of Health   Financial Resource Strain: Not on file  Food Insecurity: Not on file  Transportation Needs: Not on file  Physical Activity: Not on file  Stress: Not on file  Social Connections: Not on file  Intimate Partner Violence: Not on file    Family History:    Family History  Problem Relation Age of Onset   Hypertension Mother    CVA Mother    Emphysema Mother        smoked   Lung cancer Father 17       asbestos exposure   Cancer - Lung Father        smoked and asbestos exp   Emphysema Sister        smoked   Esophageal cancer Other        nephew   Diabetes Other        nephew   Colon cancer Neg Hx    Pancreatic cancer Neg Hx    Kidney disease Neg Hx    Liver disease Neg Hx      ROS:  Please see the history of present illness.   All other ROS reviewed and negative.     Physical Exam/Data:   Vitals:   12/06/22 0445 12/06/22 0600 12/06/22 0822 12/06/22 0945  BP:  103/82 112/65 109/62  Pulse:  78 84 85  Resp:  (!) 25  (!) 23  Temp: 97.8 F (36.6 C)   98.2 F (36.8 C)  TempSrc: Oral   Oral  SpO2:  94%  96%  Weight:  126.1 kg    Height:  _0  (1.727 m)      Intake/Output Summary (Last 24 hours) at 12/06/2022 1048 Last data filed at 12/06/2022 0756 Gross per 24 hour  Intake --  Output 401 ml  Net -401 ml      12/06/2022    6:00 AM 11/09/2022   11:49 AM  Last 3 Weights  Weight (lbs) 278 lb 278 lb  Weight (kg) 126.1 kg 126.1 kg     Body mass index is 42.27 kg/m.  General:  Well nourished, well developed, in no acute distress HEENT: normal Neck: no JVD Vascular: No carotid bruits; Distal pulses 2+ bilaterally Cardiac:  normal S1, S2; RRR; unable to  hear murmur with known MS due to significant wheezing Lungs: prominent inspiratory/expiratory wheezing in all fields. No rales noted. Abd: soft, nontender, no hepatomegaly  Ext: no edema Musculoskeletal:  No deformities, BUE and BLE strength normal and equal Skin: warm and dry  Neuro:  CNs 2-12 intact, no focal abnormalities noted Psych:  Normal affect   EKG:  The EKG was personally reviewed and demonstrates: On/3/23 ECG tracing from 11:01 PM shows sinus arrhythmia with known left anterior fascicular block pattern.  Stable/known T wave inversions in aVR, V1. Telemetry:  Telemetry was personally reviewed and demonstrates:  significant overnight tachycardia with rates up to 198 bpm. Now regular in the 80s.  Relevant CV Studies:  11/05/22 TTE  IMPRESSIONS     1. Left ventricular ejection fraction, by estimation, is 65 to 70%. The  left ventricle has normal function. The left ventricle has no regional  wall motion abnormalities. There is severe concentric left ventricular  hypertrophy. Diastolic function  indeterminant due to severe MAC. Elevated left atrial pressure.   2. There is a resting LVOT peak gradient of 82mHg that did not change  significantly with valsalva. No SAM visualized.   3. The aortic valve has been repaired/replaced. There  is a 23 mm Sapien  prosthetic (TAVR) valve present in the aortic position. Aortic valve mean  gradient measures 16.0 mmHg. Aortic valve Vmax measures 3.04 m/s. DI 0.43,  EOA 1.5cm2. There is trivial  paravalvular leak.   4. The mitral valve is degenerative. Severe calcific mitral stenosis. The  mean mitral valve gradient is 13.0 mmHg at HR 63bpm. MVA by continuity is  1.1cm2. Severe mitral annular calcification. Trivial mitral regurgitation.   5. Right ventricular systolic function is normal. The right ventricular  size is not well visualized. There is moderately elevated pulmonary artery  systolic pressure.   6. Left atrial size was severely  dilated.   7. The inferior vena cava is normal in size with greater than 50%  respiratory variability, suggesting right atrial pressure of 3 mmHg.   Comparison(s): Compared to TTE from [place] in 11/2021, the mean mitral  gradient has increased from 66mHg to 114mg. The AoV mean gradient has  decreased from 2476m to 22m37mwith similar DI at 0.43.   FINDINGS  Left Ventricle: There is a resting LVOT peak gradient of 86mm55mhat did  not change significantly with valsalva. Left ventricular ejection  fraction, by estimation, is 65 to 70%. The left ventricle has normal  function. The left ventricle has no regional  wall motion abnormalities. The left ventricular internal cavity size was  normal in size. There is severe concentric left ventricular hypertrophy.  Diastolic function indeterminant due to severe MAC. Elevated left atrial  pressure.   Right Ventricle: The right ventricular size is not well visualized. Right  vetricular wall thickness was not well visualized. Right ventricular  systolic function is normal. There is moderately elevated pulmonary artery  systolic pressure. The tricuspid  regurgitant velocity is 3.52 m/s, and with an assumed right atrial  pressure of 3 mmHg, the estimated right ventricular systolic pressure is  52.6 92.1.   Left Atrium: Left atrial size was severely dilated.   Right Atrium: Right atrial size was normal in size.   Pericardium: There is no evidence of pericardial effusion.   Mitral Valve: There is severe calcific mitral stenosis with mean gradient  13mmH79m HR 63bpm. MVA by continuity is 1.1cm2. The mitral valve is  degenerative in appearance. There is severe thickening of the mitral valve  leaflet(s). There is severe  calcification of the mitral valve leaflet(s). Severe mitral annular  calcification. Trivial mitral valve regurgitation. Severe mitral valve  stenosis. MV peak gradient, 27.7 mmHg. The mean mitral valve gradient is  13.0 mmHg.    Tricuspid Valve: The tricuspid valve is normal in structure. Tricuspid  valve regurgitation is mild.   Aortic Valve: The aortic valve has been repaired/replaced. Aortic valve  mean gradient measures 16.0 mmHg. Aortic valve peak gradient measures 37.0  mmHg. Aortic valve area, by VTI measures 1.97 cm. There is a 23 mm Sapien  prosthetic, stented (TAVR) valve   present in the aortic position.   Pulmonic Valve: The pulmonic valve was normal in structure. Pulmonic valve  regurgitation is trivial.   Aorta: The aortic root and ascending aorta are structurally normal, with  no evidence of dilitation.   Venous: The inferior vena cava is normal in size with greater than 50%  respiratory variability, suggesting right atrial pressure of 3 mmHg.   IAS/Shunts: The atrial septum is grossly normal.   Laboratory Data:  High Sensitivity Troponin:   Recent Labs  Lab 12/05/22 2234 12/06/22 0109 12/06/22 0737 12/06/22 0810  TROPONINIHS 24* 747* 1,360* 1,200*  Chemistry Recent Labs  Lab 12/05/22 2234 12/05/22 2240 12/06/22 0810  NA 132* 137 135  K 3.2* 3.5 3.5  CL 99 101 101  CO2 22  --  22  GLUCOSE 183* 182* 270*  BUN _0 CREATININE 1.06* 1.00 0.98  CALCIUM 8.5*  --  8.3*  MG 2.1  --  2.0  GFRNONAA 53*  --  59*  ANIONGAP 11  --  12    Recent Labs  Lab 12/05/22 2234 12/06/22 0810  PROT 6.8 6.2*  ALBUMIN 3.7 3.4*  AST 35 46*  ALT 20 23  ALKPHOS 76 67  BILITOT 0.9 0.8   Lipids No results for input(s): "CHOL", "TRIG", "HDL", "LABVLDL", "LDLCALC", "CHOLHDL" in the last 168 hours.  Hematology Recent Labs  Lab 12/05/22 2234 12/05/22 2240 12/06/22 0810  WBC 9.4  --  7.1  RBC 4.88  --  4.46  HGB 13.9 14.6 12.7  HCT 42.9 43.0 39.3  MCV 87.9  --  88.1  MCH 28.5  --  28.5  MCHC 32.4  --  32.3  RDW 16.8*  --  16.9*  PLT 209  --  188   Thyroid No results for input(s): "TSH", "FREET4" in the last 168 hours.  BNP Recent Labs  Lab 12/05/22 2234  BNP 446.5*     DDimer No results for input(s): "DDIMER" in the last 168 hours.   Radiology/Studies:  DG Chest 1 View  Result Date: 12/05/2022 CLINICAL DATA:  Shortness of breath. EXAM: CHEST  1 VIEW COMPARISON:  Radiograph 11/24/2021 FINDINGS: Stable heart size and mediastinal contours. Peribronchial thickening. No focal airspace disease. No pleural effusion or pneumothorax. No acute osseous findings. IMPRESSION: Peribronchial thickening may be bronchitic or congestive. Electronically Signed   By: Keith Rake M.D.   On: 12/05/2022 22:57     Assessment and Plan:   Victoria Delgado is a 82 y.o. 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 Marcello Moores.  Elevated troponin CAD s/p PCI  Patient admitted in the setting of acute shortness of breath, RSV also found with elevated troponin 24->747->1360->1200.  ECG without acute ischemic change, shows stable/known T wave inversion in AVR, V1.  Given tachyarrhythmia in the ED late yesterday evening with ventricular rates up to 198, and hypoxia 2/2 RSV, suspect that patient's troponin leak is secondary to type II MI/supply demand mismatch rather than ACS. Per outpatient notes, patient also suspected to have some degree of HOCM which may also be contributing. Given recent dyspnea, right and left heart catheterization could be considered Repeat troponin pending. Agree with echocardiogram that has been ordered.  If patient is found to have new cardiomyopathy or wall motion abnormalities, would need to consider left heart catheterization given her CAD history. Continue heparin for now.  If follow-up troponins show improvement and echocardiogram is reassuring, transition back to home Eliquis. Continue Crestor  Hx atrial fibrillation  Secondary hypercoagulable state  Given patient's history of  atrial fibrillation in the setting of acute illness and lack of response to adenosine, suspect that tachyarrhythmia overnight may have been atrial fibrillation with RVR.  She is now back in normal sinus rhythm.  Continue home metoprolol succinate 50 mg.  Given concurrent use of steroids to manage RSV, increased risk of recurrent atrial fibrillation. Continue heparin, transition back to home Eliquis  once ACS ruled out.  Chronic diastolic heart failure LVH  Patient with severe concentric LVH on 11/05/22 TTE. BNP this admission is 446.5, up from 115.5 on 11/09/22. Patient does not appear to be acutely volume up on exam.   TTE pending this admission Continue home Lasix  Hypertension  Patient with low normal BP this admission. Continue home therapies.   Dyslipidemia  LDL 88, VLDL 32 as of 05/15/22. Continue Rosuvastatin.  Mitral stenosis  Patient with severe calcific mitral stenosis, mean gradient 13.0 mmHg. Continue to monitor, may warrant RHC evaluation at some point.   Per primary team:  Hyperglycemia  Risk Assessment/Risk Scores:      CHA2DS2-VASc Score = 5   This indicates a 7.2% annual risk of stroke. The patient's score is based upon: CHF History: 0 HTN History: 1 Diabetes History: 0 Stroke History: 0 Vascular Disease History: 1 Age Score: 2 Gender Score: 1     For questions or updates, please contact [place] Please consult [email or web address] for contact info under    Signed, Victoria Kocher, PA-C  12/06/2022 10:48 AM   History and all data above reviewed.  Patient examined.  I agree with the findings as above.  The patient has had a couple of days of increased cough, wheezing and cold like symptoms that progresses to SOB at rest.  Presented as above.  Run of tachycardia last night that like was brief atrial fib.  BNP is mildly elevated.  There is some suggestion of volume overload on top of a primary pulmonary process.  However, she was not having weight  gain, edema , palpitations or chest pain prior to this.  I have seen her several times in the office to work up increasing dyspnea.  He has a history of TAVR (normal function on echo earlier this month and again today), MS (severity is not clear), CAD and severe LVH (likely primary with a contribution from her AS.  The patient exam reveals COR:RRR  ,  Lungs: Clear  ,  Abd: Positive bowel sounds, no rebound no guarding, Ext No edema  .  All available labs, radiology testing, previous records reviewed. Agree with documented assessment and plan.  SOB:  Likely primary related to the RSV.  However, given her recent DOE as an out patient and my plans for possible right heart cath electively,  I would like to do the right heart cath while she is here with left heart cath as well given the increased troponin.    This can be done before discharge after she has improved from a respiratory standpoint.   Victoria Delgado  11:50 AM  12/06/2022

## 2022-12-07 DIAGNOSIS — J9601 Acute respiratory failure with hypoxia: Secondary | ICD-10-CM | POA: Diagnosis not present

## 2022-12-07 DIAGNOSIS — B338 Other specified viral diseases: Secondary | ICD-10-CM | POA: Diagnosis not present

## 2022-12-07 DIAGNOSIS — I471 Supraventricular tachycardia, unspecified: Secondary | ICD-10-CM | POA: Diagnosis not present

## 2022-12-07 LAB — CBC
HCT: 39.3 % (ref 36.0–46.0)
Hemoglobin: 12.8 g/dL (ref 12.0–15.0)
MCH: 28.7 pg (ref 26.0–34.0)
MCHC: 32.6 g/dL (ref 30.0–36.0)
MCV: 88.1 fL (ref 80.0–100.0)
Platelets: 208 10*3/uL (ref 150–400)
RBC: 4.46 MIL/uL (ref 3.87–5.11)
RDW: 17 % — ABNORMAL HIGH (ref 11.5–15.5)
WBC: 15.7 10*3/uL — ABNORMAL HIGH (ref 4.0–10.5)
nRBC: 0.1 % (ref 0.0–0.2)

## 2022-12-07 LAB — COMPREHENSIVE METABOLIC PANEL
ALT: 21 U/L (ref 0–44)
AST: 40 U/L (ref 15–41)
Albumin: 3.3 g/dL — ABNORMAL LOW (ref 3.5–5.0)
Alkaline Phosphatase: 63 U/L (ref 38–126)
Anion gap: 8 (ref 5–15)
BUN: 11 mg/dL (ref 8–23)
CO2: 27 mmol/L (ref 22–32)
Calcium: 8.5 mg/dL — ABNORMAL LOW (ref 8.9–10.3)
Chloride: 102 mmol/L (ref 98–111)
Creatinine, Ser: 0.81 mg/dL (ref 0.44–1.00)
GFR, Estimated: >60 mL/min (ref >60)
Glucose, Bld: 211 mg/dL — ABNORMAL HIGH (ref 70–99)
Potassium: 3.6 mmol/L (ref 3.5–5.1)
Sodium: 137 mmol/L (ref 135–145)
Total Bilirubin: 0.9 mg/dL (ref 0.3–1.2)
Total Protein: 6.5 g/dL (ref 6.5–8.1)

## 2022-12-07 LAB — GLUCOSE, CAPILLARY
Glucose-Capillary: 186 mg/dL — ABNORMAL HIGH (ref 70–99)
Glucose-Capillary: 270 mg/dL — ABNORMAL HIGH (ref 70–99)
Glucose-Capillary: 224 mg/dL — ABNORMAL HIGH (ref 70–99)

## 2022-12-07 LAB — APTT
aPTT: 101 seconds — ABNORMAL HIGH (ref 24–36)
aPTT: 95 seconds — ABNORMAL HIGH (ref 24–36)

## 2022-12-07 LAB — URINE CULTURE

## 2022-12-07 LAB — HEMOGLOBIN A1C
Hgb A1c MFr Bld: 7.1 % — ABNORMAL HIGH (ref 4.8–5.6)
Mean Plasma Glucose: 157 mg/dL

## 2022-12-07 LAB — HEPARIN LEVEL (UNFRACTIONATED): Heparin Unfractionated: >1.1 IU/mL — ABNORMAL HIGH (ref 0.30–0.70)

## 2022-12-07 MED ORDER — METHYLPREDNISOLONE SODIUM SUCC 40 MG IJ SOLR: 40.0000 mg | Freq: Three times a day (TID) | INTRAMUSCULAR | Status: DC

## 2022-12-07 MED ORDER — LEVALBUTEROL HCL 0.63 MG/3ML IN NEBU
0.6300 mg | INHALATION_SOLUTION | RESPIRATORY_TRACT | Status: DC
  Administered 2022-12-07 – 2022-12-10 (×18): 0.63 mg via RESPIRATORY_TRACT
  Filled 2022-12-07 (×20): qty 3

## 2022-12-07 MED ORDER — METHYLPREDNISOLONE SODIUM SUCC 125 MG IJ SOLR
120.0000 mg | INTRAMUSCULAR | Status: DC
  Administered 2022-12-07 – 2022-12-09 (×3): 120 mg via INTRAVENOUS
  Filled 2022-12-07 (×3): qty 2

## 2022-12-07 MED ORDER — INSULIN ASPART 100 UNIT/ML IJ SOLN
0.0000 [IU] | Freq: Every day | INTRAMUSCULAR | Status: DC
  Administered 2022-12-07: 2 [IU] via SUBCUTANEOUS
  Administered 2022-12-08 – 2022-12-09 (×2): 3 [IU] via SUBCUTANEOUS
  Administered 2022-12-10: 1 [IU] via SUBCUTANEOUS

## 2022-12-07 MED ORDER — INSULIN ASPART 100 UNIT/ML IJ SOLN
0.0000 [IU] | Freq: Three times a day (TID) | INTRAMUSCULAR | Status: DC
  Administered 2022-12-07: 5 [IU] via SUBCUTANEOUS
  Administered 2022-12-08 (×2): 3 [IU] via SUBCUTANEOUS
  Administered 2022-12-08: 2 [IU] via SUBCUTANEOUS
  Administered 2022-12-09: 3 [IU] via SUBCUTANEOUS
  Administered 2022-12-09: 5 [IU] via SUBCUTANEOUS
  Administered 2022-12-09 – 2022-12-10 (×3): 3 [IU] via SUBCUTANEOUS
  Administered 2022-12-10 – 2022-12-11 (×2): 2 [IU] via SUBCUTANEOUS
  Administered 2022-12-11: 5 [IU] via SUBCUTANEOUS
  Administered 2022-12-11: 3 [IU] via SUBCUTANEOUS
  Administered 2022-12-12: 2 [IU] via SUBCUTANEOUS
  Administered 2022-12-12: 5 [IU] via SUBCUTANEOUS
  Administered 2022-12-12 – 2022-12-13 (×2): 2 [IU] via SUBCUTANEOUS
  Administered 2022-12-13: 1 [IU] via SUBCUTANEOUS

## 2022-12-07 NOTE — Progress Notes (Signed)
Per MD recommendation, tentatively placed on the schedule for Monday afternoon 1:30PM Left and Right heart cath by Dr. Irish Lack. Will need to assess the patient either this weekend or Monday morning to make sure her breathing improve. Will need consent and order once determined she can proceed. I will tentatively place NPO order for Monday morning.

## 2022-12-07 NOTE — Progress Notes (Signed)
PROGRESS NOTE  Victoria Delgado  DOB: 1941-06-05  PCP: Charlane Ferretti, DO RWE:[id number]  DOA: 12/05/2022  LOS: 1 day  Hospital Day: 3  Brief narrative: Victoria Delgado is a 82 y.o. female with PMH significant for obesity, OSA on CPAP, HTN, HLD, CAD/stents, AS s/p TAVR, paroxysmal A-fib, GI bleed on Eliquis, GERD 1/3, patient presented to the ED with complaint of cough, shortness of breath for 1 week which progressed significantly in the last 3 days. EMS noted O2 sat low at 89% on room air.  She was wheezing, given bronchodilators, started on supplemental oxygen and brought to the ED. She also had low-grade fever, dysuria, N/V/D.  There was possible concern of UTI.  In the ED, patient was afebrile, heart rate 113, breathing on 2 L oxygen by nasal cannula. While in the ED, she had an episode of SVT with heart rate elevated to 198, did not respond to adenosine and hence patient was started on amiodarone drip.  She subsequently converted to normal sinus rhythm. Respiratory panel positive for RSV WBC count 9.4 sodium 132, potassium 3.2, BNP 447, troponin 24 with subsequently rose to 747. Chest x-ray showed peribronchial thickening may be bronchitic or congestive.  Patient was started on treatment for COPD exacerbation.  Patient ED course complicated by  SVT with heart rate up to 198 which did not response to adenosine.  Cardiology recommended amiodarone drip after which rhythm converted to normal sinus. Admitted to [facility]  Subjective: Patient was seen and examined this morning.   Sitting up in recliner.  On 4 L oxygen by nasal cannula. Continues to have wheezing and cough and deep breathing especially Husband at bedside. Noted to plan for cardiology for left heart cath after breathing improved, probably next week. Blood sugar level running elevated Assessment and plan: Acute exacerbation of COPD  Acute respiratory failure with hypoxia  RSV bronchitis  Presented from home with cough, shortness  of breath worsening for a week  RSV positive  Clinical and COPD exacerbation.   Chest x-ray showed bronchitis changes.   Currently on IV Solu-Medrol 40 mg twice daily, doxycycline, Xopenex. Noted plan from PCCM to increase Solu-Medrol to 40 mg 3 times daily today. Order for incentive spirometry.  SVT History of paroxysmal A-fib While in the ED, patient had SVT with heart rate up to 198 which did not response to adenosine.  Cardiology recommended amiodarone drip after which rhythm converted to normal sinus. Amiodarone drip was subsequently held. Continue metoprolol PTA on Eliquis.  Currently on heparin drip.  Elevated troponin H/o CAD/stents HLD Troponin was initially 24, later elevated to 747 probably after SVT.  Likely demand ischemia due to severe tachycardia. Heparin drip was initiated in the ED Allergy to aspirin.  Plavix started Continue metoprolol, statin, Zetia Recent Labs    12/05/22 2234 12/06/22 0109 12/06/22 0737 12/06/22 0810  TROPONINIHS 24* 747* 1,360* 1,200*   Chronic diastolic CHF Essential hypertension AS s/p TAVR Continue metoprolol succinate 50 mg twice daily and Lasix 40 mg daily CHF seems compensated.  Blood pressure stable.  Prediabetes Hyperglycemia with steroids No history of DM.  A1c was elevated to 6.4 in April 2023.  Repeat A1c Currently blood sugars running elevated probably because of high-dose IV steroids. Start sliding scale insulin with Accu-Cheks.  May need addition of long-acting agent as well. Lab Results  Component Value Date   HGBA1C 6.4 (A) 03/21/2022   Recent Labs  Lab 12/06/22 0802 12/07/22 0651  GLUCAP 253* 186*   Hypokalemia  Potassium level improved with replacement. Recent Labs  Lab 12/05/22 2234 12/05/22 2240 12/06/22 0810 12/07/22 0625  K 3.2* 3.5 3.5 3.6  MG 2.1  --  2.0  --   PHOS  --   --  3.4  --    Morbid obesity  -Body mass index is 41.87 kg/m. Patient has been advised to make an attempt to improve  diet and exercise patterns to aid in weight loss.  OSA  on CPAP    GERD Continue PPI    Mobility: PT eval  Goals of care   Code Status: Full Code    Scheduled Meds:  benzonatate  100 mg Oral TID   cholecalciferol  1,000 Units Oral BID   clopidogrel  75 mg Oral Daily   doxycycline  100 mg Oral Q12H   ezetimibe  10 mg Oral Daily   famotidine  20 mg Oral Daily   furosemide  40 mg Oral Daily   insulin aspart  0-5 Units Subcutaneous QHS   insulin aspart  0-9 Units Subcutaneous TID WC   levalbuterol  0.63 mg Nebulization Q4H   methylPREDNISolone (SOLU-MEDROL) injection  120 mg Intravenous Q24H   metoprolol succinate  50 mg Oral BID   multivitamin with minerals  1 tablet Oral Once per day on Mon Thu   rosuvastatin  20 mg Oral Daily   umeclidinium bromide  1 puff Inhalation Daily    PRN meds: acetaminophen **OR** acetaminophen, guaiFENesin-dextromethorphan, ondansetron **OR** ondansetron (ZOFRAN) IV   Infusions:   heparin 1,300 Units/hr (12/07/22 0357)    Skin assessment:     Nutritional status:  Body mass index is 41.87 kg/m.          Diet:  Diet Order             Diet heart healthy/carb modified Room service appropriate? Yes; Fluid consistency: Thin  Diet effective now                  DVT prophylaxis: Heparin drip   Antimicrobials: Doxycycline Fluid: None Consultants: Cardiology Family Communication: Husband at bedside  Status is: Inpatient  Continue inhospital care because: Respiratory failure Level of care: Progressive   Dispo: The patient is from: Home              Anticipated d/c is to: Pending clinical course, pending PT eval              Patient currently is not medically stable to d/c.   Difficult to place patient No    Antimicrobials: Anti-infectives (From admission, onward)    Start     Dose/Rate Route Frequency Ordered Stop   12/06/22 1000  doxycycline (VIBRA-TABS) tablet 100 mg        100 mg Oral Every 12 hours 12/06/22  0535 12/11/22 0959       Objective: Vitals:   12/07/22 0820 12/07/22 1137  BP: (!) 147/66 122/60  Pulse: 91 86  Resp: (!) 26 20  Temp: 98.2 F (36.8 C) 98 F (36.7 C)  SpO2: 92% 93%    Intake/Output Summary (Last 24 hours) at 12/07/2022 1249 Last data filed at 12/07/2022 0350 Gross per 24 hour  Intake 360 ml  Output 500 ml  Net -140 ml   Filed Weights   12/06/22 0600 12/07/22 0344  Weight: 126.1 kg 124.9 kg   Weight change: -1.2 kg Body mass index is 41.87 kg/m.   Physical Exam: General exam: Pleasant, elderly Caucasian female. Skin: No rashes, lesions or ulcers. HEENT:  Atraumatic, normocephalic, no obvious bleeding Lungs: Continues to have mild end expiratory wheezing bilaterally CVS: Rate controlled A-fib, no murmur  GI/Abd soft, nontender, nondistended, bowel sound present CNS: Alert, awake, oriented x 3 Psychiatry: Mood appropriate Extremities: No pedal edema, no calf tenderness  Data Review: I have personally reviewed the laboratory data and studies available.  F/u labs ordered Unresulted Labs (From admission, onward)     Start     Ordered   12/07/22 1500  APTT  Once,   R       Question:  Specimen collection method  Answer:  Lab=Lab collect   12/07/22 0850   12/07/22 1248  Hemoglobin A1c  Add-on,   AD       Comments: To assess prior glycemic control   Question:  Specimen collection method  Answer:  Lab=Lab collect   12/07/22 1247   12/07/22 0500  Heparin level (unfractionated)  Daily,   R     See Hyperspace for full Linked Orders Report.   12/06/22 0627   12/07/22 0500  APTT  Daily,   R     See Hyperspace for full Linked Orders Report.   12/06/22 0627   12/07/22 0500  CBC  Daily,   R     See Hyperspace for full Linked Orders Report.   12/06/22 0627   12/06/22 0630  Blood gas, arterial  Once,   R        12/06/22 0629            Total time spent in review of labs and imaging, patient evaluation, formulation of plan, documentation and  communication with family 6 minutes  Signed, Terrilee Croak, MD [facility] 12/07/2022

## 2022-12-07 NOTE — Progress Notes (Signed)
ANTICOAGULATION CONSULT NOTE - follow-up  Pharmacy Consult for heparin Indication: chest pain/ACS and atrial fibrillation  Allergies  Allergen Reactions   Codeine Nausea Only   Kenalog [Triamcinolone Acetonide] Other (See Comments)    Had A.fib after she received the injection."Steroid meds"   Kenalog [Triamcinolone] Rash    Other reaction(s): AFIB   Pantoprazole Rash    Other reaction(s): chronic upper GI pain all the way to back   Relafen [Nabumetone] Other (See Comments)    Edema    Epinephrine Palpitations   Penicillins Rash    40 years ago, injection site was red and swollen.  rash, facial/tongue/throat swelling, SOB or lightheadedness with hypotension: Yes Has patient had a PCN reaction causing immediate  Has patient had a PCN reaction causing severe rash involving mucus membranes or skin necrosis: NO Has patient had a PCN reaction that required hospitalization. No  Has patient had a PCN reaction occurring within the last 10 years: No If all of the above answers are "NO", then may proceed with Cephalosporin use.     Tramadol Other (See Comments)    Auditory halllucinations    Patient Measurements: Height: 5\' 8"  (172.7 cm) Weight: 124.9 kg (275 lb 5.7 oz) IBW/kg (Calculated) : 63.9 Heparin Dosing Weight: 95kg  Vital Signs: Temp: 98 F (36.7 C) (01/05 1137) Temp Source: Oral (01/05 1137) BP: 122/60 (01/05 1137) Pulse Rate: 86 (01/05 1137)  Labs: Recent Labs    12/05/22 2234 12/05/22 2240 12/06/22 0109 12/06/22 0737 12/06/22 0810 12/06/22 1452 12/07/22 0625 12/07/22 1427  HGB 13.9 14.6  --   --  12.7  --  12.8  --   HCT 42.9 43.0  --   --  39.3  --  39.3  --   PLT 209  --   --   --  188  --  208  --   APTT  --   --   --   --   --  144* 101* 95*  HEPARINUNFRC  --   --   --   --   --  >1.10* >1.10*  --   CREATININE 1.06* 1.00  --   --  0.98  --  0.81  --   TROPONINIHS 24*  --  747* 1,360* 1,200*  --   --   --      Estimated Creatinine Clearance: 78.5  mL/min (by C-G formula based on SCr of 0.81 mg/dL).   Medical History: Past Medical History:  Diagnosis Date   Abdominal pain    Arthritis    osteoarthritis-hips,knees, back, hands   Asymmetric septal hypertrophy (HCC)    Atrial fibrillation (HCC)    CAD (coronary artery disease)    2 drug-eluting stents placed in the circumflex leading into a marginal.  May 2017.  [name].   catheterization on 03/19/2018, this showed patent stent in the proximal to mid left circumflex artery, 20% ostial left circumflex artery disease, 40% OM 3 disease, widely patent stent in the ostial D1, 20% ostial to proximal LAD disease, 20% mid LAD disease.   Cataract    corrective surgery done   Fatty liver    Gallstones    GERD (gastroesophageal reflux disease)    Hepatitis    hepatitis A; past hx.,many yrs ago ? food source   Hernia, hiatal    Hyperlipidemia    Hypertension    Obesity    Sleep apnea    CPAP    Assessment: 82yo female c/o SOB and cough, found  to be positive for RSV and was in SVT on arrival to ED, known h/o Afib, initial troponin mildly elevated but now rising sharply >> to begin heparin; on Eliquis PTA for Afib, last dose 1/3 9a.  HL > 1.1 due to effects of Eliquis. aPTT 95 sec on infusion at 1300 units/hr. No bleeding noted.  Goal of Therapy:  Heparin level 0.3-0.7 units/ml aPTT 66-102 seconds Monitor platelets by anticoagulation protocol: Yes   Plan:  Continue heparin drip at 1300 units/hr Monitor heparin level, aPTT (while Eliquis affects anti-Xa assay), and CBC  Sherlon Handing, PharmD, BCPS Please see amion for complete clinical pharmacist phone list 12/07/2022 3:46 PM

## 2022-12-07 NOTE — Progress Notes (Addendum)
Rounding Note    Patient Name: Victoria Delgado Date of Encounter: 12/07/2022  [place] Cardiologist: Minus Breeding, MD   Subjective   Denies any CP. Breathing is still bad.   Inpatient Medications    Scheduled Meds:  benzonatate  100 mg Oral TID   cholecalciferol  1,000 Units Oral BID   clopidogrel  75 mg Oral Daily   doxycycline  100 mg Oral Q12H   ezetimibe  10 mg Oral Daily   famotidine  20 mg Oral Daily   furosemide  40 mg Oral Daily   levalbuterol  0.63 mg Nebulization Q4H   methylPREDNISolone (SOLU-MEDROL) injection  40 mg Intravenous Q12H   metoprolol succinate  50 mg Oral BID   multivitamin with minerals  1 tablet Oral Once per day on Mon Thu   rosuvastatin  20 mg Oral Daily   umeclidinium bromide  1 puff Inhalation Daily   Continuous Infusions:  heparin 1,300 Units/hr (12/07/22 0357)   PRN Meds: acetaminophen **OR** acetaminophen, guaiFENesin-dextromethorphan, ondansetron **OR** ondansetron (ZOFRAN) IV   Vital Signs    Vitals:   12/07/22 0354 12/07/22 0406 12/07/22 0818 12/07/22 0820  BP:  127/60  (!) 147/66  Pulse:  85  91  Resp:  (!) 21  (!) 26  Temp:  98.1 F (36.7 C)  98.2 F (36.8 C)  TempSrc:  Oral  Oral  SpO2: 92% 92% 92% 92%  Weight:      Height:        Intake/Output Summary (Last 24 hours) at 12/07/2022 0936 Last data filed at 12/07/2022 0350 Gross per 24 hour  Intake 360 ml  Output 500 ml  Net -140 ml      12/07/2022    3:44 AM 12/06/2022    6:00 AM 11/09/2022   11:49 AM  Last 3 Weights  Weight (lbs) 275 lb 5.7 oz 278 lb 278 lb  Weight (kg) 124.9 kg 126.1 kg 126.1 kg      Telemetry    NSR, no recurrent afib since arriving on the floor - Personally Reviewed  ECG    NSR without significant ST-T wave changes - Personally Reviewed  Physical Exam   GEN: No acute distress.   Neck: No JVD Cardiac: RRR, no murmurs, rubs, or gallops.  Respiratory: diffuse wheezing GI: Soft, nontender, non-distended  MS: No edema; No  deformity. Neuro:  Nonfocal  Psych: Normal affect   Labs    High Sensitivity Troponin:   Recent Labs  Lab 12/05/22 2234 12/06/22 0109 12/06/22 0737 12/06/22 0810  TROPONINIHS 24* 747* 1,360* 1,200*     Chemistry Recent Labs  Lab 12/05/22 2234 12/05/22 2240 12/06/22 0810 12/07/22 0625  NA 132* 137 135 137  K 3.2* 3.5 3.5 3.6  CL 99 101 101 102  CO2 22  --  22 27  GLUCOSE 183* 182* 270* 211*  BUN 14 15 11 11   CREATININE 1.06* 1.00 0.98 0.81  CALCIUM 8.5*  --  8.3* 8.5*  MG 2.1  --  2.0  --   PROT 6.8  --  6.2* 6.5  ALBUMIN 3.7  --  3.4* 3.3*  AST 35  --  46* 40  ALT 20  --  23 21  ALKPHOS 76  --  67 63  BILITOT 0.9  --  0.8 0.9  GFRNONAA 53*  --  59* >60  ANIONGAP 11  --  12 8    Lipids No results for input(s): "CHOL", "TRIG", "HDL", "LABVLDL", "LDLCALC", "CHOLHDL" in  the last 168 hours.  Hematology Recent Labs  Lab 12/05/22 2234 12/05/22 2240 12/06/22 0810 12/07/22 0625  WBC 9.4  --  7.1 15.7*  RBC 4.88  --  4.46 4.46  HGB 13.9 14.6 12.7 12.8  HCT 42.9 43.0 39.3 39.3  MCV 87.9  --  88.1 88.1  MCH 28.5  --  28.5 28.7  MCHC 32.4  --  32.3 32.6  RDW 16.8*  --  16.9* 17.0*  PLT 209  --  188 208   Thyroid No results for input(s): "TSH", "FREET4" in the last 168 hours.  BNP Recent Labs  Lab 12/05/22 2234  BNP 446.5*    DDimer No results for input(s): "DDIMER" in the last 168 hours.   Radiology    ECHOCARDIOGRAM COMPLETE  Result Date: 12/06/2022    ECHOCARDIOGRAM REPORT   Patient Name:   LUDMILLA MARRISON Date of Exam: 12/06/2022 Medical Rec #:  [id number]     Height:       68.0 in Accession #:    [id number]    Weight:       278.0 lb Date of Birth:  1941/11/23    BSA:          2.351 m Patient Age:    82 years      BP:           109/62 mmHg Patient Gender: F             HR:           84 bpm. Exam Location:  Inpatient Procedure: 2D Echo, Cardiac Doppler and Color Doppler Indications:    Other abnormalities of the heart R00.8  History:        Patient has prior  history of Echocardiogram examinations, most                 recent 11/04/2022. CAD and Angina, Arrythmias:Atrial Fibrillation                 and Tachycardia, Signs/Symptoms:Chest Pain and Shortness of                 Breath; Risk Factors:Hypertension, Sleep Apnea and Dyslipidemia.                 Aortic Valve: 23 mm Edwards Sapien prosthetic, stented (TAVR)                 valve is present in the aortic position. Procedure Date:                 05/14/22.  Sonographer:    Ronny Flurry Sonographer#2:  Raquel Sarna Senior Referring Phys: [id number] [place]  1. Left ventricular ejection fraction, by estimation, is 70 to 75%. The left ventricle has hyperdynamic function. The left ventricle has no regional wall motion abnormalities. There is moderate left ventricular hypertrophy. Left ventricular diastolic parameters are indeterminate.  2. Resting peak LVOT gradient 84 mmHg  3. Right ventricular systolic function is normal. The right ventricular size is normal. Tricuspid regurgitation signal is inadequate for assessing PA pressure.  4. Left atrial size was mildly dilated.  5. A small pericardial effusion is present.  6. The mitral valve is degenerative. Trivial mitral valve regurgitation. Severe mitral stenosis. The mean mitral valve gradient is 16.0 mmHg with average heart rate of 82 bpm. Severe mitral annular calcification.  7. The aortic valve has been repaired/replaced. Aortic valve regurgitation is not visualized. There is a 23 mm Edwards Sapien prosthetic (TAVR)  valve present in the aortic position. Procedure Date: 05/14/22. Vmax 3.2 m/s, MG 20 mmHg, EOA 2.2 cm^2, DI 0.58 FINDINGS  Left Ventricle: Left ventricular ejection fraction, by estimation, is 70 to 75%. The left ventricle has hyperdynamic function. The left ventricle has no regional wall motion abnormalities. The left ventricular internal cavity size was normal in size. There is moderate left ventricular hypertrophy. Left ventricular  diastolic parameters are indeterminate. Right Ventricle: The right ventricular size is normal. No increase in right ventricular wall thickness. Right ventricular systolic function is normal. Tricuspid regurgitation signal is inadequate for assessing PA pressure. Left Atrium: Left atrial size was mildly dilated. Right Atrium: Right atrial size was normal in size. Pericardium: A small pericardial effusion is present. Presence of epicardial fat layer. Mitral Valve: The mitral valve is degenerative in appearance. Severe mitral annular calcification. Trivial mitral valve regurgitation. Severe mitral valve stenosis. MV peak gradient, 30.6 mmHg. The mean mitral valve gradient is 16.0 mmHg with average heart rate of 82 bpm. Tricuspid Valve: The tricuspid valve is normal in structure. Tricuspid valve regurgitation is trivial. Aortic Valve: The aortic valve has been repaired/replaced. Aortic valve regurgitation is not visualized. Aortic valve mean gradient measures 20.0 mmHg. Aortic valve peak gradient measures 41.2 mmHg. Aortic valve area, by VTI measures 2.17 cm. 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 Procedure Date: 05/14/22. Pulmonic Valve: The pulmonic valve was not well visualized. Pulmonic valve regurgitation is not visualized. Aorta: The aortic root and ascending aorta are structurally normal, with no evidence of dilitation. IAS/Shunts: The interatrial septum was not well visualized.  LEFT VENTRICLE PLAX 2D LVIDd:         4.30 cm   Diastology LVIDs:         2.40 cm   LV e' medial:   5.98 cm/s LV PW:         1.30 cm   LV E/e' medial: 27.1 LV IVS:        1.20 cm LVOT diam:     2.20 cm LV SV:         127 LV SV Index:   54 LVOT Area:     3.80 cm  RIGHT VENTRICLE RV S prime:     11.10 cm/s TAPSE (M-mode): 1.7 cm LEFT ATRIUM              Index        RIGHT ATRIUM           Index LA Vol (A2C):   120.0 ml 51.05 ml/m  RA Area:     18.00 cm LA Vol (A4C):   69.5 ml   29.56 ml/m  RA Volume:   42.60 ml  18.12 ml/m LA Biplane Vol: 93.8 ml  39.90 ml/m  AORTIC VALVE AV Area (Vmax):    3.69 cm AV Area (Vmean):   1.87 cm AV Area (VTI):     2.17 cm AV Vmax:           321.00 cm/s AV Vmean:          199.500 cm/s AV VTI:            0.586 m AV Peak Grad:      41.2 mmHg AV Mean Grad:      20.0 mmHg LVOT Vmax:         312.00 cm/s LVOT Vmean:        98.300 cm/s LVOT VTI:  0.335 m LVOT/AV VTI ratio: 0.57  AORTA Ao Root diam: 2.80 cm Ao Asc diam:  3.50 cm MITRAL VALVE MV Area (PHT): 2.60 cm     SHUNTS MV Area VTI:   1.74 cm     Systemic VTI:  0.34 m MV Peak grad:  30.6 mmHg    Systemic Diam: 2.20 cm MV Mean grad:  16.0 mmHg MV Vmax:       2.77 m/s MV Vmean:      191.7 cm/s MV Decel Time: 292 msec MV E velocity: 162.00 cm/s MV A velocity: 193.00 cm/s MV E/A ratio:  0.84 Oswaldo Milian MD Electronically signed by Oswaldo Milian MD Signature Date/Time: 12/06/2022/12:12:53 PM    Final    DG Chest 1 View  Result Date: 12/05/2022 CLINICAL DATA:  Shortness of breath. EXAM: CHEST  1 VIEW COMPARISON:  Radiograph 11/24/2021 FINDINGS: Stable heart size and mediastinal contours. Peribronchial thickening. No focal airspace disease. No pleural effusion or pneumothorax. No acute osseous findings. IMPRESSION: Peribronchial thickening may be bronchitic or congestive. Electronically Signed   By: Keith Rake M.D.   On: 12/05/2022 22:57    Cardiac Studies   Echo 12/06/2022 1. Left ventricular ejection fraction, by estimation, is 70 to 75%. The  left ventricle has hyperdynamic function. The left ventricle has no  regional wall motion abnormalities. There is moderate left ventricular  hypertrophy. Left ventricular diastolic  parameters are indeterminate.   2. Resting peak LVOT gradient 84 mmHg   3. Right ventricular systolic function is normal. The right ventricular  size is normal. Tricuspid regurgitation signal is inadequate for assessing  PA pressure.   4. Left atrial  size was mildly dilated.   5. A small pericardial effusion is present.   6. The mitral valve is degenerative. Trivial mitral valve regurgitation.  Severe mitral stenosis. The mean mitral valve gradient is 16.0 mmHg with  average heart rate of 82 bpm. Severe mitral annular calcification.   7. The aortic valve has been repaired/replaced. Aortic valve  regurgitation is not visualized. There is a 23 mm Edwards Sapien  prosthetic (TAVR) valve present in the aortic position. Procedure Date:  05/14/22. Vmax 3.2 m/s, MG 20 mmHg, EOA 2.2 cm^2, DI  0.58   Patient Profile     82 y.o. female with PMH of CAD, AS s/p TAVR 09/2020, moderate MS, HTN, HOCM, OSA, HLD, GI bleed on Eliquis and PAF presented with SOB related to RSV and found to have elevated troponin  Assessment & Plan    Elevated troponin  - on plavix. Denies any CP. Serial trop 24 --> 747--> 1360--> 1200.  - had rapid afib in the ED yesterday. Elevation of trop likely related to demand ischemia from RSV and afib, although cannot completely rule out ACS either. Echo showed normal EF without wall motion abnormality. Per Dr. Percival Spanish, plan for L&RHC once breathing improves  Dyspnea: diffuse wheezing on exam. On abx, breathing treatment and IV steroid. Primarily related to RSV, patient does not appears to be volume overloaded. On 40mg  daily oral lasix.   Severe mitral stenosis: will reassess on L&RHC once breathing improves  RSV positive: per primary team. On abx, breathing treatment and IV steroid.   PAF: had rapid afib in the ED yesterday, no recurrence overnight. Brief use of amiodarone in the ED. Continue metoprolol for rate control. Continue IV heparin.   CAD s/p PCI  H/o TAVR  Leukocytosis: related to steroid therapy   For questions or updates, please contact [place]  Please consult [email or web address] for contact info under     Signed, Almyra Deforest, [place]  12/07/2022, 9:36 AM     History and all data above reviewed.   Patient examined.  I agree with the findings as above. Denies chest pain  but her breathing is worse than previous.   The patient exam reveals COR:RRR  ,  Lungs: Decreased breath sounds with diffuse wheezing  ,  Abd: Positive bowel sounds, no rebound no guarding, Ext No edema  .  All available labs, radiology testing, previous records reviewed. Agree with documented assessment and plan.   Elevated troponin: This is likely secondary.  However with this and her recent chronic complaints I would like schedule a right and left cath.  I will tentatively plan for Monday if her pulmonary status is much improved over the weekend.   Atrial fib:  PAF.  Continue heparin for now.  MS:  Right heart cath.  Eventually as an out patient I will further work up the [place] with TEE.    Jeneen Rinks Khaleef Ruby  11:13 AM  12/07/2022

## 2022-12-07 NOTE — Progress Notes (Signed)
NAME:  AZLYNN MITNICK, MRN:  [id number], DOB:  1941/06/12, LOS: 1 ADMISSION DATE:  12/05/2022, CONSULTATION DATE: 12/06/2018 REFERRING MD: [facility], CHIEF COMPLAINT: Shortness of breath  History of Present Illness:  82 year old female pulmonary patient of Dr. Verlee Monte who has a plethora of health issues that are well-documented below and is also carries the allergy to albuterol which creates SVT when taking.  She presents with approximately 2 weeks of increasing respiratory distress, clear sputum production, postnasal drip and now positive for RSV.  And route to the hospital she was given albuterol which created an SVT which is now resolved.  She notes postnasal drip, productive cough with opaque sputum without fevers chills or sweats.  She is positive for RSV.  Pulmonary critical care asked to evaluate.  Pertinent  Medical History   Past Medical History:  Diagnosis Date   Abdominal pain    Arthritis    osteoarthritis-hips,knees, back, hands   Asymmetric septal hypertrophy (HCC)    Atrial fibrillation (HCC)    CAD (coronary artery disease)    2 drug-eluting stents placed in the circumflex leading into a marginal.  May 2017.  [name].   catheterization on 03/19/2018, this showed patent stent in the proximal to mid left circumflex artery, 20% ostial left circumflex artery disease, 40% OM 3 disease, widely patent stent in the ostial D1, 20% ostial to proximal LAD disease, 20% mid LAD disease.   Cataract    corrective surgery done   Fatty liver    Gallstones    GERD (gastroesophageal reflux disease)    Hepatitis    hepatitis A; past hx.,many yrs ago ? food source   Hernia, hiatal    Hyperlipidemia    Hypertension    Obesity    Sleep apnea    CPAP     Significant Hospital Events: Including procedures, antibiotic start and stop dates in addition to other pertinent events   Positive RSV  Interim History / Subjective:  No acute distress  Objective   Blood pressure (!) 147/66, pulse 91,  temperature 98.2 F (36.8 C), temperature source Oral, resp. rate (!) 26, height 5\' 8"  (1.727 m), weight 124.9 kg, SpO2 92 %.        Intake/Output Summary (Last 24 hours) at 12/07/2022 1017 Last data filed at 12/07/2022 0350 Gross per 24 hour  Intake 360 ml  Output 500 ml  Net -140 ml   Filed Weights   12/06/22 0600 12/07/22 0344  Weight: 126.1 kg 124.9 kg    Examination: General: Morbidly obese female in no acute distress HEENT: MM pink/moist no JVD Neuro: Grossly intact without focal defect CV: Heart sounds are regular PULM: Diminished in the bases.  Currently on 3 L nasal cannula with O2 saturationsGI: soft, bsx4 active   Extremities: warm/dry, 1+ edema  Skin: no rashes or lesions Note negative INO Resolved Hospital Problem list     Assessment & Plan:  Worsening respiratory distress over the last 2 weeks with increasing postnasal drip coughing and wheezing and she presents to the emergency department today is positive for RSV. Oxygen as needed Continue bronchodilators utilizing Xopenex as she has SVT from albuterol. 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 and requires Metroprolol Cardizem amiodarone for rate control Per primary Note beta-blocker can be contributing to her wheezing Noted to have ventricular rate of 86 no evidence of S VT is negative Per primary and cardiology Questionable cardiac cath  prior to discharge  Morbid obesity Weight loss  Best Practice (right click and "Reselect all SmartList Selections" daily)   Diet/type: Regular consistency (see orders) DVT prophylaxis: systemic heparin GI prophylaxis: PPI Lines: N/A Foley:  N/A Code Status:  full code Last date of multidisciplinary goals of care discussion [tbd]  Labs   CBC: Recent Labs  Lab 12/05/22 2234 12/05/22 2240 12/06/22 0810 12/07/22 0625  WBC 9.4  --  7.1 15.7*  NEUTROABS 6.1  --  6.8   --   HGB 13.9 14.6 12.7 12.8  HCT 42.9 43.0 39.3 39.3  MCV 87.9  --  88.1 88.1  PLT 209  --  188 233    Basic Metabolic Panel: Recent Labs  Lab 12/05/22 2234 12/05/22 2240 12/06/22 0810 12/07/22 0625  NA 132* 137 135 137  K 3.2* 3.5 3.5 3.6  CL 99 101 101 102  CO2 22  --  22 27  GLUCOSE 183* 182* 270* 211*  BUN 14 15 11 11   CREATININE 1.06* 1.00 0.98 0.81  CALCIUM 8.5*  --  8.3* 8.5*  MG 2.1  --  2.0  --   PHOS  --   --  3.4  --    GFR: Estimated Creatinine Clearance: 78.5 mL/min (by C-G formula based on SCr of 0.81 mg/dL). Recent Labs  Lab 12/05/22 2234 12/06/22 0810 12/07/22 0625  WBC 9.4 7.1 15.7*    Liver Function Tests: Recent Labs  Lab 12/05/22 2234 12/06/22 0810 12/07/22 0625  AST 35 46* 40  ALT 20 23 21   ALKPHOS 76 67 63  BILITOT 0.9 0.8 0.9  PROT 6.8 6.2* 6.5  ALBUMIN 3.7 3.4* 3.3*   No results for input(s): "LIPASE", "AMYLASE" in the last 168 hours. No results for input(s): "AMMONIA" in the last 168 hours.  ABG    Component Value Date/Time   PHART 7.394 07/11/2020 0804   PCO2ART 43.2 07/11/2020 0804   PO2ART 69 (L) 07/11/2020 0804   HCO3 27.5 07/11/2020 0808   TCO2 24 12/05/2022 2240   O2SAT 71.0 07/11/2020 0808     Coagulation Profile: No results for input(s): "INR", "PROTIME" in the last 168 hours.  Cardiac Enzymes: No results for input(s): "CKTOTAL", "CKMB", "CKMBINDEX", "TROPONINI" in the last 168 hours.  HbA1C: Hemoglobin A1C  Date/Time Value Ref Range Status  03/21/2022 11:05 AM 6.4 (A) 4.0 - 5.6 % Final   Hgb A1c MFr Bld  Date/Time Value Ref Range Status  11/24/2021 03:25 AM 6.8 (H) 4.8 - 5.6 % Final    Comment:    (NOTE) Pre diabetes:          5.7%-6.4%  Diabetes:              >6.4%  Glycemic control for   <7.0% adults with diabetes   02/24/2020 01:52 PM 5.9 4.6 - 6.5 % Final    Comment:    Glycemic Control Guidelines for People with Diabetes:Non Diabetic:  <6%Goal of Therapy: <7%Additional Action Suggested:  >8%      CBG: Recent Labs  Lab 12/06/22 0802 12/07/22 0651  GLUCAP 253* 186*      Steve Mohanad Carsten ACNP Acute Care Nurse Practitioner [place] Please consult [place] 12/07/2022, 10:17 AM

## 2022-12-07 NOTE — Progress Notes (Signed)
Pt refuses capap

## 2022-12-07 NOTE — Progress Notes (Signed)
  Transition of Care [facility]) Screening Note   Patient Details  Name: DONN WILMOT Date of Birth: 12-30-1940   Transition of Care [facility]) CM/SW Contact:    Dawayne Patricia, RN Phone Number: 12/07/2022, 3:33 PM    Transition of Care Department [facility]) has reviewed patient and no TOC needs have been identified at this time. We will continue to monitor patient advancement through interdisciplinary progression rounds. If new patient transition needs arise, please place a TOC consult.

## 2022-12-07 NOTE — Progress Notes (Signed)
ANTICOAGULATION CONSULT NOTE - follow-up  Pharmacy Consult for heparin Indication: chest pain/ACS and atrial fibrillation  Allergies  Allergen Reactions   Codeine Nausea Only   Kenalog [Triamcinolone Acetonide] Other (See Comments)    Had A.fib after she received the injection."Steroid meds"   Kenalog [Triamcinolone] Rash    Other reaction(s): AFIB   Pantoprazole Rash    Other reaction(s): chronic upper GI pain all the way to back   Relafen [Nabumetone] Other (See Comments)    Edema    Epinephrine Palpitations   Penicillins Rash    40 years ago, injection site was red and swollen.  rash, facial/tongue/throat swelling, SOB or lightheadedness with hypotension: Yes Has patient had a PCN reaction causing immediate  Has patient had a PCN reaction causing severe rash involving mucus membranes or skin necrosis: NO Has patient had a PCN reaction that required hospitalization. No  Has patient had a PCN reaction occurring within the last 10 years: No If all of the above answers are "NO", then may proceed with Cephalosporin use.     Tramadol Other (See Comments)    Auditory halllucinations    Patient Measurements: Height: 5\' 8"  (172.7 cm) Weight: 124.9 kg (275 lb 5.7 oz) IBW/kg (Calculated) : 63.9 Heparin Dosing Weight: 95kg  Vital Signs: Temp: 98.1 F (36.7 C) (01/05 0406) Temp Source: Oral (01/05 0406) BP: 127/60 (01/05 0406) Pulse Rate: 85 (01/05 0406)  Labs: Recent Labs    12/05/22 2234 12/05/22 2240 12/06/22 0109 12/06/22 0737 12/06/22 0810 12/06/22 1452 12/07/22 0625  HGB 13.9 14.6  --   --  12.7  --  12.8  HCT 42.9 43.0  --   --  39.3  --  39.3  PLT 209  --   --   --  188  --  208  APTT  --   --   --   --   --  144* 101*  HEPARINUNFRC  --   --   --   --   --  >1.10* >1.10*  CREATININE 1.06* 1.00  --   --  0.98  --  0.81  TROPONINIHS 24*  --  747* 1,360* 1,200*  --   --     Estimated Creatinine Clearance: 64.9 mL/min (by C-G formula based on SCr of 0.98  mg/dL).   Medical History: Past Medical History:  Diagnosis Date   Abdominal pain    Arthritis    osteoarthritis-hips,knees, back, hands   Asymmetric septal hypertrophy (HCC)    Atrial fibrillation (HCC)    CAD (coronary artery disease)    2 drug-eluting stents placed in the circumflex leading into a marginal.  May 2017.  [name].   catheterization on 03/19/2018, this showed patent stent in the proximal to mid left circumflex artery, 20% ostial left circumflex artery disease, 40% OM 3 disease, widely patent stent in the ostial D1, 20% ostial to proximal LAD disease, 20% mid LAD disease.   Cataract    corrective surgery done   Fatty liver    Gallstones    GERD (gastroesophageal reflux disease)    Hepatitis    hepatitis A; past hx.,many yrs ago ? food source   Hernia, hiatal    Hyperlipidemia    Hypertension    Obesity    Sleep apnea    CPAP    Assessment: 82yo female c/o SOB and cough, found to be positive for RSV and was in SVT on arrival to ED, known h/o Afib, initial troponin mildly elevated but now  rising sharply >> to begin heparin; on Eliquis PTA for Afib, last dose 1/3 9a.  HL > 1.1 due to effects of Eliquis. aPTT elevated at 144 seconds. Lab appears to have been drawn from R hand. Heparin infusing in L forearm.  12/07/2022 AM update: aPTT: 101 seconds- therapeutic HL: >1.10- aPTT and HL are not correlating 2/2 Eliquis use PTA No signs of bleeding No issues (pauses, line occlusions, etc) with the heparin infusion noted   Goal of Therapy:  Heparin level 0.3-0.7 units/ml aPTT 66-102 seconds Monitor platelets by anticoagulation protocol: Yes   Plan:  Continue heparin drip at 1300 units/hr Check aPTT in 8 hours Monitor heparin level, aPTT (while Eliquis affects anti-Xa assay), and CBC.  Vaughan Basta BS, PharmD, BCPS Clinical Pharmacist 12/07/2022 7:18 AM  Contact: [phone] after 3 PM  "Be curious, not judgmental..." -Jamal Maes

## 2022-12-07 NOTE — Evaluation (Signed)
Physical Therapy Evaluation Patient Details Name: Victoria Delgado MRN: [id number] DOB: 1941-02-08 Today's Date: 12/07/2022  History of Present Illness  Pt is a 82 y.o. female admitted 12/05/22 with acute respiratory failure, RSV infection. Elevated troponin; plan for [facility] once breathing improves. PMH includes CAD, AS s/p TAVR, afib, OSA, emphysema, obesity, bilateral THA.   Clinical Impression  Pt presents with an overall decrease in functional mobility secondary to above. PTA, pt independent, retired from work, lives with supportive husband. Today, pt moving well without assist, though limited by fatigue, cough and significant SOB. Educ re: activity recommendations (therex options within confines of lines/O2), pursed lip breathing, importance of mobility. Pt would benefit from continued acute PT services to maximize functional mobility and independence prior to d/c home.     SpO2 down to 84% on 4L O2 [place] with activity    Recommendations for follow up therapy are one component of a multi-disciplinary discharge planning process, led by the attending physician.  Recommendations may be updated based on patient status, additional functional criteria and insurance authorization.  Follow Up Recommendations No PT follow up      Assistance Recommended at Discharge PRN  Patient can return home with the following  Assistance with cooking/housework;Assist for transportation    Equipment Recommendations None recommended by PT  Recommendations for Other Services   Mobility Specialist    Functional Status Assessment Patient has had a recent decline in their functional status and demonstrates the ability to make significant improvements in function in a reasonable and predictable amount of time.     Precautions / Restrictions Precautions Precautions: Other (comment) Precaution Comments: watch SpO2 (does not wear O2 baseline) Restrictions Weight Bearing Restrictions: No      Mobility  Bed  Mobility Overal bed mobility: Modified Independent                  Transfers Overall transfer level: Needs assistance Equipment used: None Transfers: Sit to/from Stand Sit to Stand: Supervision           General transfer comment: supervision for safety/lines; able to stand from EOB and recliner without assist    Ambulation/Gait Ambulation/Gait assistance: Supervision Gait Distance (Feet): 14 Feet Assistive device: None Gait Pattern/deviations: Step-through pattern, Decreased stride length Gait velocity: Decreased     General Gait Details: slow, fatigued gait without DME, supervision for safety/lines; seated rest secondary to SOB with SpO2 down to 84% on 4L O2 [place]  Stairs            Wheelchair Mobility    Modified Rankin (Stroke Patients Only)       Balance Overall balance assessment: Needs assistance   Sitting balance-Leahy Scale: Good     Standing balance support: No upper extremity supported, During functional activity Standing balance-Leahy Scale: Fair                               Pertinent Vitals/Pain Pain Assessment Pain Assessment: Faces Faces Pain Scale: Hurts a little bit Pain Location: abdomen Pain Descriptors / Indicators: Discomfort Pain Intervention(s): Monitored during session    Home Living Family/patient expects to be discharged to:: Private residence Living Arrangements: Spouse/significant other Available Help at Discharge: Family;Available 24 hours/day Type of Home: House Home Access: Stairs to enter Entrance Stairs-Rails: Right Entrance Stairs-Number of Steps: 3-4   Home Layout: One level Home Equipment: Conservation officer, nature (2 wheels);Cane - single point;Grab bars - tub/shower;Other (comment) (scooter) Additional Comments: pt and  husband retired from work    Prior Function Prior Level of Function : Independent/Modified Independent;Driving             Mobility Comments: independent without DME        Journalist, newspaper        Extremity/Trunk Assessment   Upper Extremity Assessment Upper Extremity Assessment: Overall WFL for tasks assessed    Lower Extremity Assessment Lower Extremity Assessment: Overall WFL for tasks assessed    Cervical / Trunk Assessment Cervical / Trunk Assessment: Normal  Communication   Communication: No difficulties  Cognition Arousal/Alertness: Awake/alert Behavior During Therapy: WFL for tasks assessed/performed Overall Cognitive Status: Within Functional Limits for tasks assessed                                          General Comments General comments (skin integrity, edema, etc.): husband present and supportive. educ pt re: activity recommendations (importance of shorter bouts of activity frequently throughout day), pursed lip breathing, therex within confines of lines/O2    Exercises Other Exercises Other Exercises: sitting/standing therex within confines of lines/O2 - repeated sit<>stands, marching in place, steps forwards/backwards   Assessment/Plan    PT Assessment Patient needs continued PT services  PT Problem List Decreased activity tolerance;Decreased balance;Decreased mobility;Cardiopulmonary status limiting activity       PT Treatment Interventions DME instruction;Gait training;Functional mobility training;Therapeutic activities;Therapeutic exercise;Stair training;Balance training;Patient/family education    PT Goals (Current goals can be found in the Care Plan section)  Acute Rehab PT Goals Patient Stated Goal: better breathing PT Goal Formulation: With patient Time For Goal Achievement: 12/21/22 Potential to Achieve Goals: Good    Frequency Min 3X/week     Co-evaluation               AM-PAC PT "6 Clicks" Mobility  Outcome Measure Help needed turning from your back to your side while in a flat bed without using bedrails?: None Help needed moving from lying on your back to sitting on the side of  a flat bed without using bedrails?: None Help needed moving to and from a bed to a chair (including a wheelchair)?: A Little Help needed standing up from a chair using your arms (e.g., wheelchair or bedside chair)?: A Little Help needed to walk in hospital room?: A Little Help needed climbing 3-5 steps with a railing? : A Little 6 Click Score: 20    End of Session Equipment Utilized During Treatment: Oxygen Activity Tolerance: Patient tolerated treatment well;Patient limited by fatigue Patient left: in chair;with call bell/phone within reach;with family/visitor present Nurse Communication: Mobility status PT Visit Diagnosis: Other abnormalities of gait and mobility (R26.89)    Time: 1036-1100 PT Time Calculation (min) (ACUTE ONLY): 24 min   Charges:   PT Evaluation $PT Eval Moderate Complexity: [place], PT, DPT Acute Rehabilitation Services  Personal: [facility] Office: [phone]  Derry Lory 12/07/2022, 12:47 PM

## 2022-12-07 NOTE — Progress Notes (Signed)
Refused cpap, pt states she does wear one at home but does not want to wear one tonight.

## 2022-12-08 ENCOUNTER — Encounter (HOSPITAL_COMMUNITY): Payer: Self-pay | Admitting: Internal Medicine

## 2022-12-08 ENCOUNTER — Other Ambulatory Visit: Payer: Self-pay

## 2022-12-08 DIAGNOSIS — I471 Supraventricular tachycardia, unspecified: Secondary | ICD-10-CM | POA: Diagnosis not present

## 2022-12-08 LAB — CBC
HCT: 38.3 % (ref 36.0–46.0)
Hemoglobin: 12.4 g/dL (ref 12.0–15.0)
MCH: 28.4 pg (ref 26.0–34.0)
MCHC: 32.4 g/dL (ref 30.0–36.0)
MCV: 87.8 fL (ref 80.0–100.0)
Platelets: 218 10*3/uL (ref 150–400)
RBC: 4.36 MIL/uL (ref 3.87–5.11)
RDW: 16.9 % — ABNORMAL HIGH (ref 11.5–15.5)
WBC: 14 10*3/uL — ABNORMAL HIGH (ref 4.0–10.5)
nRBC: 0.1 % (ref 0.0–0.2)

## 2022-12-08 LAB — GLUCOSE, CAPILLARY
Glucose-Capillary: 188 mg/dL — ABNORMAL HIGH (ref 70–99)
Glucose-Capillary: 213 mg/dL — ABNORMAL HIGH (ref 70–99)
Glucose-Capillary: 222 mg/dL — ABNORMAL HIGH (ref 70–99)
Glucose-Capillary: 293 mg/dL — ABNORMAL HIGH (ref 70–99)

## 2022-12-08 LAB — HEPARIN LEVEL (UNFRACTIONATED): Heparin Unfractionated: >1.1 IU/mL — ABNORMAL HIGH (ref 0.30–0.70)

## 2022-12-08 LAB — APTT: aPTT: 100 seconds — ABNORMAL HIGH (ref 24–36)

## 2022-12-08 MED ORDER — NYSTATIN 100000 UNIT/GM EX POWD
Freq: Two times a day (BID) | CUTANEOUS | Status: AC
  Filled 2022-12-08: qty 15

## 2022-12-08 NOTE — Progress Notes (Signed)
Rounding Note    Patient Name: Victoria Delgado Date of Encounter: 12/08/2022  [facility] Cardiologist: Rollene Rotunda, MD   Subjective   NAEO. Improved breathing this AM. Husband at bedside.  Inpatient Medications    Scheduled Meds:  benzonatate  100 mg Oral TID   cholecalciferol  1,000 Units Oral BID   clopidogrel  75 mg Oral Daily   doxycycline  100 mg Oral Q12H   ezetimibe  10 mg Oral Daily   famotidine  20 mg Oral Daily   furosemide  40 mg Oral Daily   insulin aspart  0-5 Units Subcutaneous QHS   insulin aspart  0-9 Units Subcutaneous TID WC   levalbuterol  0.63 mg Nebulization Q4H   methylPREDNISolone (SOLU-MEDROL) injection  120 mg Intravenous Q24H   metoprolol succinate  50 mg Oral BID   multivitamin with minerals  1 tablet Oral Once per day on Mon Thu   rosuvastatin  20 mg Oral Daily   umeclidinium bromide  1 puff Inhalation Daily   Continuous Infusions:  heparin 1,200 Units/hr (12/08/22 0405)   PRN Meds: acetaminophen **OR** acetaminophen, guaiFENesin-dextromethorphan, ondansetron **OR** ondansetron (ZOFRAN) IV   Vital Signs    Vitals:   12/08/22 0342 12/08/22 0500 12/08/22 0721 12/08/22 0820  BP:   (!) 153/76   Pulse:   73   Resp:   20   Temp:   98 F (36.7 C)   TempSrc:   Oral   SpO2: 97%  95% 96%  Weight:  125.5 kg    Height:        Intake/Output Summary (Last 24 hours) at 12/08/2022 1141 Last data filed at 12/08/2022 1000 Gross per 24 hour  Intake 600 ml  Output 550 ml  Net 50 ml      12/08/2022    5:00 AM 12/07/2022    3:44 AM 12/06/2022    6:00 AM  Last 3 Weights  Weight (lbs) 276 lb 9.6 oz 275 lb 5.7 oz 278 lb  Weight (kg) 125.465 kg 124.9 kg 126.1 kg      Telemetry    sinus - Personally Reviewed  ECG    Personally Reviewed  Physical Exam   GEN: No acute distress.  Mild tachypnea. obese Neck: No JVD Cardiac: RRR, no audible murmur given significant wheezing.  Respiratory: prolonged Expiratory phase. Wheezing in  bilateral lung fields. GI: Soft, nontender, non-distended  MS: No edema; No deformity. Neuro:  Nonfocal  Psych: Normal affect   Labs    High Sensitivity Troponin:   Recent Labs  Lab 12/05/22 2234 12/06/22 0109 12/06/22 0737 12/06/22 0810  TROPONINIHS 24* 747* 1,360* 1,200*     Chemistry Recent Labs  Lab 12/05/22 2234 12/05/22 2240 12/06/22 0810 12/07/22 0625  NA 132* 137 135 137  K 3.2* 3.5 3.5 3.6  CL 99 101 101 102  CO2 22  --  22 27  GLUCOSE 183* 182* 270* 211*  BUN 14 15 11 11   CREATININE 1.06* 1.00 0.98 0.81  CALCIUM 8.5*  --  8.3* 8.5*  MG 2.1  --  2.0  --   PROT 6.8  --  6.2* 6.5  ALBUMIN 3.7  --  3.4* 3.3*  AST 35  --  46* 40  ALT 20  --  23 21  ALKPHOS 76  --  67 63  BILITOT 0.9  --  0.8 0.9  GFRNONAA 53*  --  59* >60  ANIONGAP 11  --  12 8  Lipids No results for input(s): "CHOL", "TRIG", "HDL", "LABVLDL", "LDLCALC", "CHOLHDL" in the last 168 hours.  Hematology Recent Labs  Lab 12/06/22 0810 12/07/22 0625 12/08/22 0143  WBC 7.1 15.7* 14.0*  RBC 4.46 4.46 4.36  HGB 12.7 12.8 12.4  HCT 39.3 39.3 38.3  MCV 88.1 88.1 87.8  MCH 28.5 28.7 28.4  MCHC 32.3 32.6 32.4  RDW 16.9* 17.0* 16.9*  PLT 188 208 218   Thyroid No results for input(s): "TSH", "FREET4" in the last 168 hours.  BNP Recent Labs  Lab 12/05/22 2234  BNP 446.5*    DDimer No results for input(s): "DDIMER" in the last 168 hours.   Radiology    No results found.    Assessment & Plan    82 y.o. 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 Continue plavix.  #Severe MS Plan for RHC at time of LHC to assess further. Maintenance of sinus important for hemodynamics.  #pAF Continue heparin gtt Cont  metop Low threshold to add back amiodarone if AF recurs     For questions or updates, please contact [place] Please consult [email or web address] for contact info under        Signed, Vickie Epley, MD  12/08/2022, 11:41 AM

## 2022-12-08 NOTE — Progress Notes (Signed)
ANTICOAGULATION CONSULT NOTE - follow-up  Pharmacy Consult for heparin Indication: chest pain/ACS and atrial fibrillation  Allergies  Allergen Reactions   Codeine Nausea Only   Kenalog [Triamcinolone Acetonide] Other (See Comments)    Had A.fib after she received the injection."Steroid meds"   Kenalog [Triamcinolone] Rash    Other reaction(s): AFIB   Pantoprazole Rash    Other reaction(s): chronic upper GI pain all the way to back   Relafen [Nabumetone] Other (See Comments)    Edema    Epinephrine Palpitations   Penicillins Rash    40 years ago, injection site was red and swollen.  rash, facial/tongue/throat swelling, SOB or lightheadedness with hypotension: Yes Has patient had a PCN reaction causing immediate  Has patient had a PCN reaction causing severe rash involving mucus membranes or skin necrosis: NO Has patient had a PCN reaction that required hospitalization. No  Has patient had a PCN reaction occurring within the last 10 years: No If all of the above answers are "NO", then may proceed with Cephalosporin use.     Tramadol Other (See Comments)    Auditory halllucinations    Patient Measurements: Height: 5\' 8"  (172.7 cm) Weight: 124.9 kg (275 lb 5.7 oz) IBW/kg (Calculated) : 63.9 Heparin Dosing Weight: 95kg  Vital Signs: Temp: 97.7 F (36.5 C) (01/06 0300) Temp Source: Oral (01/06 0300) BP: 128/65 (01/06 0300) Pulse Rate: 70 (01/06 0300)  Labs: Recent Labs    12/05/22 2240 12/06/22 0109 12/06/22 0737 12/06/22 0810 12/06/22 0810 12/06/22 1452 12/07/22 0625 12/07/22 1427 12/08/22 0143  HGB 14.6  --   --  12.7  --   --  12.8  --  12.4  HCT 43.0  --   --  39.3  --   --  39.3  --  38.3  PLT  --   --   --  188  --   --  208  --  218  APTT  --   --   --   --    < > 144* 101* 95* 100*  HEPARINUNFRC  --   --   --   --   --  >1.10* >1.10*  --  >1.10*  CREATININE 1.00  --   --  0.98  --   --  0.81  --   --   TROPONINIHS  --  747* 1,360* 1,200*  --   --   --    --   --    < > = values in this interval not displayed.     Estimated Creatinine Clearance: 78.5 mL/min (by C-G formula based on SCr of 0.81 mg/dL).   Medical History: Past Medical History:  Diagnosis Date   Abdominal pain    Arthritis    osteoarthritis-hips,knees, back, hands   Asymmetric septal hypertrophy (HCC)    Atrial fibrillation (HCC)    CAD (coronary artery disease)    2 drug-eluting stents placed in the circumflex leading into a marginal.  May 2017.  June 2017.   catheterization on 03/19/2018, this showed patent stent in the proximal to mid left circumflex artery, 20% ostial left circumflex artery disease, 40% OM 3 disease, widely patent stent in the ostial D1, 20% ostial to proximal LAD disease, 20% mid LAD disease.   Cataract    corrective surgery done   Fatty liver    Gallstones    GERD (gastroesophageal reflux disease)    Hepatitis    hepatitis A; past hx.,many yrs ago ? food source  Hernia, hiatal    Hyperlipidemia    Hypertension    Obesity    Sleep apnea    CPAP    Assessment: 82yo female c/o SOB and cough, found to be positive for RSV and was in SVT on arrival to ED, known h/o Afib, initial troponin mildly elevated but now rising sharply >> to begin heparin; on Eliquis PTA for Afib, last dose 1/3 9a.  HL > 1.1 due to effects of Eliquis. aPTT 100 sec on infusion at 1300 units/hr. No bleeding reported, CBC stable. Will empirically reduce heparin rate ~1 unit/kg/hr to prevent supra-therapeutic level  Goal of Therapy:  Heparin level 0.3-0.7 units/ml aPTT 66-102 seconds Monitor platelets by anticoagulation protocol: Yes   Plan:  Reduce heparin drip to 1200 units/hr Monitor heparin level, aPTT (while Eliquis affects anti-Xa assay), and CBC  Georga Bora, PharmD Clinical Pharmacist 12/08/2022 3:55 AM Please check AMION for all [place] numbers

## 2022-12-08 NOTE — Progress Notes (Addendum)
PROGRESS NOTE  Victoria Delgado  DOB: 06-05-1941  PCP: Victoria Ferretti, DO SWN:[id number]  DOA: 12/05/2022  LOS: 2 days  Hospital Day: 4  Brief narrative: Victoria Delgado is a 82 y.o. female with PMH significant for obesity, OSA on CPAP, HTN, HLD, CAD/stents, AS s/p TAVR, paroxysmal A-fib on Eliquis, GI bleed, GERD 1/3, patient presented to the ED with complaint of cough, shortness of breath for 1 week which progressed significantly in the last 3 days. EMS noted O2 sat low at 89% on room air.  She was wheezing, given bronchodilators, started on supplemental oxygen and brought to the ED. She also had low-grade fever, dysuria, N/V/D.  There was possible concern of UTI.  In the ED, patient was afebrile, heart rate 113, breathing on 2 L oxygen by nasal cannula. While in the ED, she had an episode of A-fib with RVR to 198, she was started on amiodarone drip and subsequently converted to normal sinus rhythm. Respiratory panel positive for RSV Chest x-ray showed peribronchial thickening may be bronchitic or congestive.  Patient was started on treatment for COPD exacerbation. Admitted to [facility]  Subjective: Seen and examined at the request 4 L down to 3 L.  Wheezing on exam.  She states her wheezing has improved. Noted plan for cardiology for left heart cath after breathing has improved, possibly next week.  Assessment and plan: Acute exacerbation of COPD  Acute respiratory failure with hypoxia  RSV bronchitis  Presented from home with cough, shortness of breath worsening for a week  RSV positive  Clinical and COPD exacerbation.   Chest x-ray showed bronchitis changes.   She is currently on IV Solu-Medrol 120 mg daily, for pulmonary. Also on doxycycline, Xopenex. Continue incentive spirometer.  Paroxysmal A-fib with RVR Cardiology recommended amiodarone drip after which rhythm converted to normal sinus. Amiodarone drip was subsequently held. Continue metoprolol PTA on Eliquis.  Currently on  heparin drip.  Elevated troponin H/o CAD/stents HLD Troponin was initially 24, later elevated to 747 Heparin drip was initiated in the ED Allergy to aspirin.  Plavix started Continue metoprolol, statin, Zetia, heparin drip Recent Labs    12/05/22 2234 12/06/22 0109 12/06/22 0737 12/06/22 0810  TROPONINIHS 24* 747* 1,360* 1,200*   Chronic diastolic CHF Essential hypertension AS s/p TAVR Continue metoprolol succinate 50 mg twice daily and Lasix 40 mg daily CHF seems compensated.  Blood pressure stable.  Prediabetes Hyperglycemia exacerbated with steroids No known history of diabetes.  A1c was elevated to 6.4 in April 2023.  Repeat A1c 7.1 on 12/07/2022. Continue insulin coverage. Lab Results  Component Value Date   HGBA1C 7.1 (H) 12/07/2022   Recent Labs  Lab 12/07/22 0651 12/07/22 1632 12/07/22 2105 12/08/22 0615 12/08/22 1233  GLUCAP 186* 270* 224* 188* 213*   Hypokalemia Potassium level improved with replacement. Recent Labs  Lab 12/05/22 2234 12/05/22 2240 12/06/22 0810 12/07/22 0625  K 3.2* 3.5 3.5 3.6  MG 2.1  --  2.0  --   PHOS  --   --  3.4  --    Morbid obesity  -Body mass index is 42.06 kg/m. Patient has been advised to make an attempt to improve diet and exercise patterns to aid in weight loss.  OSA  on CPAP    GERD Continue PPI    Mobility: PT eval.  Mobilize as tolerated.  No PT follow-up was recommended by PT.  Goals of care   Code Status: Full Code    Scheduled Meds:  benzonatate  100 mg Oral TID   cholecalciferol  1,000 Units Oral BID   clopidogrel  75 mg Oral Daily   doxycycline  100 mg Oral Q12H   ezetimibe  10 mg Oral Daily   famotidine  20 mg Oral Daily   furosemide  40 mg Oral Daily   insulin aspart  0-5 Units Subcutaneous QHS   insulin aspart  0-9 Units Subcutaneous TID WC   levalbuterol  0.63 mg Nebulization Q4H   methylPREDNISolone (SOLU-MEDROL) injection  120 mg Intravenous Q24H   metoprolol succinate  50 mg Oral  BID   multivitamin with minerals  1 tablet Oral Once per day on Mon Thu   rosuvastatin  20 mg Oral Daily   umeclidinium bromide  1 puff Inhalation Daily    PRN meds: acetaminophen **OR** acetaminophen, guaiFENesin-dextromethorphan, ondansetron **OR** ondansetron (ZOFRAN) IV   Infusions:   heparin 1,200 Units/hr (12/08/22 0405)    Skin assessment:     Nutritional status:  Body mass index is 42.06 kg/m.   Diet:  Diet Order             Diet NPO time specified Except for: Sips with Meds  Diet effective midnight           Diet heart healthy/carb modified Room service appropriate? Yes; Fluid consistency: Thin  Diet effective now                  DVT prophylaxis: Heparin drip   Antimicrobials: Doxycycline Fluid: None Consultants: Cardiology Family Communication: Husband at bedside  Status is: Inpatient  Continue inhospital care because: Respiratory failure Level of care: Progressive   Dispo: The patient is from: Home              Anticipated d/c is to: Pending clinical course, pending PT eval              Patient currently is not medically stable to d/c.   Difficult to place patient No    Antimicrobials: Anti-infectives (From admission, onward)    Start     Dose/Rate Route Frequency Ordered Stop   12/06/22 1000  doxycycline (VIBRA-TABS) tablet 100 mg        100 mg Oral Every 12 hours 12/06/22 0535 12/11/22 0959       Objective: Vitals:   12/08/22 1155 12/08/22 1225  BP:  (!) 123/58  Pulse:  76  Resp:  18  Temp:  (!) 96.9 F (36.1 C)  SpO2: 99% 95%    Intake/Output Summary (Last 24 hours) at 12/08/2022 1241 Last data filed at 12/08/2022 1000 Gross per 24 hour  Intake 600 ml  Output 550 ml  Net 50 ml   Filed Weights   12/06/22 0600 12/07/22 0344 12/08/22 0500  Weight: 126.1 kg 124.9 kg 125.5 kg   Weight change: 0.565 kg Body mass index is 42.06 kg/m.   Physical Exam: General exam: Well-developed well-nourished in no acute distress.  She is  alert and oriented x 3.   Skin: No rashes, lesions or ulcers. HEENT: Atraumatic, normocephalic, no obvious bleeding Lungs: Diffuse wheezing bilaterally.poor inspiratory effort. CVS: Regular rate and rhythm no rubs or gallops. GI/Abd soft, nontender, nondistended, bowel sound present CNS: Alert, awake, oriented x 3 Psychiatry: Mood is appropriate for condition and setting. Extremities: No pedal edema.  Data Review: I have personally reviewed the laboratory data and studies available.  F/u labs ordered Unresulted Labs (From admission, onward)     Start     Ordered   12/07/22 0500  Heparin level (unfractionated)  Daily,   R     See Hyperspace for full Linked Orders Report.   12/06/22 0627   12/07/22 0500  APTT  Daily,   R     See Hyperspace for full Linked Orders Report.   12/06/22 0627   12/07/22 0500  CBC  Daily,   R     See Hyperspace for full Linked Orders Report.   12/06/22 0627   12/06/22 0630  Blood gas, arterial  Once,   R        12/06/22 0629            Total time spent in review of labs and imaging, patient evaluation, formulation of plan, documentation and communication with family 45 minutes  Signed, Darlin Drop, MD [facility] 12/08/2022

## 2022-12-09 DIAGNOSIS — I471 Supraventricular tachycardia, unspecified: Secondary | ICD-10-CM | POA: Diagnosis not present

## 2022-12-09 LAB — BASIC METABOLIC PANEL
Anion gap: 11 (ref 5–15)
BUN: 20 mg/dL (ref 8–23)
CO2: 28 mmol/L (ref 22–32)
Calcium: 8.2 mg/dL — ABNORMAL LOW (ref 8.9–10.3)
Chloride: 101 mmol/L (ref 98–111)
Creatinine, Ser: 0.72 mg/dL (ref 0.44–1.00)
GFR, Estimated: >60 mL/min (ref >60)
Glucose, Bld: 211 mg/dL — ABNORMAL HIGH (ref 70–99)
Potassium: 3.9 mmol/L (ref 3.5–5.1)
Sodium: 140 mmol/L (ref 135–145)

## 2022-12-09 LAB — GLUCOSE, CAPILLARY
Glucose-Capillary: 215 mg/dL — ABNORMAL HIGH (ref 70–99)
Glucose-Capillary: 247 mg/dL — ABNORMAL HIGH (ref 70–99)
Glucose-Capillary: 269 mg/dL — ABNORMAL HIGH (ref 70–99)
Glucose-Capillary: 294 mg/dL — ABNORMAL HIGH (ref 70–99)

## 2022-12-09 LAB — CBC
HCT: 39.5 % (ref 36.0–46.0)
Hemoglobin: 12.1 g/dL (ref 12.0–15.0)
MCH: 28.1 pg (ref 26.0–34.0)
MCHC: 30.6 g/dL (ref 30.0–36.0)
MCV: 91.6 fL (ref 80.0–100.0)
Platelets: 183 10*3/uL (ref 150–400)
RBC: 4.31 MIL/uL (ref 3.87–5.11)
RDW: 17 % — ABNORMAL HIGH (ref 11.5–15.5)
WBC: 12.4 10*3/uL — ABNORMAL HIGH (ref 4.0–10.5)
nRBC: 0.2 % (ref 0.0–0.2)

## 2022-12-09 LAB — HEPARIN LEVEL (UNFRACTIONATED): Heparin Unfractionated: 0.93 IU/mL — ABNORMAL HIGH (ref 0.30–0.70)

## 2022-12-09 LAB — APTT: aPTT: 97 seconds — ABNORMAL HIGH (ref 24–36)

## 2022-12-09 MED ORDER — ALUM & MAG HYDROXIDE-SIMETH 200-200-20 MG/5ML PO SUSP
15.0000 mL | Freq: Four times a day (QID) | ORAL | Status: DC | PRN
  Administered 2022-12-10 – 2022-12-12 (×3): 15 mL via ORAL
  Filled 2022-12-09 (×4): qty 30

## 2022-12-09 MED ORDER — FAMOTIDINE 20 MG PO TABS
40.0000 mg | ORAL_TABLET | Freq: Every day | ORAL | Status: DC
  Administered 2022-12-09 – 2022-12-13 (×5): 40 mg via ORAL
  Filled 2022-12-09 (×6): qty 2

## 2022-12-09 MED ORDER — METHYLPREDNISOLONE SODIUM SUCC 125 MG IJ SOLR: 80.0000 mg | INTRAMUSCULAR | Status: DC

## 2022-12-09 MED ORDER — ALUM & MAG HYDROXIDE-SIMETH 200-200-20 MG/5ML PO SUSP
30.0000 mL | ORAL | Status: AC
  Administered 2022-12-09: 30 mL via ORAL
  Filled 2022-12-09: qty 30

## 2022-12-09 NOTE — Progress Notes (Signed)
ANTICOAGULATION CONSULT NOTE - follow-up  Pharmacy Consult for heparin Indication: chest pain/ACS and atrial fibrillation  Allergies  Allergen Reactions   Codeine Nausea Only   Kenalog [Triamcinolone Acetonide] Other (See Comments)    Had A.fib after she received the injection."Steroid meds"   Kenalog [Triamcinolone] Rash    Other reaction(s): AFIB   Pantoprazole Rash    Other reaction(s): chronic upper GI pain all the way to back   Relafen [Nabumetone] Other (See Comments)    Edema    Epinephrine Palpitations   Penicillins Rash    40 years ago, injection site was red and swollen.  rash, facial/tongue/throat swelling, SOB or lightheadedness with hypotension: Yes Has patient had a PCN reaction causing immediate  Has patient had a PCN reaction causing severe rash involving mucus membranes or skin necrosis: NO Has patient had a PCN reaction that required hospitalization. No  Has patient had a PCN reaction occurring within the last 10 years: No If all of the above answers are "NO", then may proceed with Cephalosporin use.     Tramadol Other (See Comments)    Auditory halllucinations    Patient Measurements: Height: 5\' 8"  (172.7 cm) Weight: 124.2 kg (273 lb 13 oz) IBW/kg (Calculated) : 63.9 Heparin Dosing Weight: 95kg  Vital Signs: Temp: 98.8 F (37.1 C) (01/07 0818) Temp Source: Oral (01/07 0818) BP: 128/57 (01/07 0818) Pulse Rate: 76 (01/07 0818)  Labs: Recent Labs    12/07/22 0625 12/07/22 1427 12/08/22 0143 12/09/22 0113  HGB 12.8  --  12.4 12.1  HCT 39.3  --  38.3 39.5  PLT 208  --  218 183  APTT 101* 95* 100* 97*  HEPARINUNFRC >1.10*  --  >1.10* 0.93*  CREATININE 0.81  --   --   --      Estimated Creatinine Clearance: 78.2 mL/min (by C-G formula based on SCr of 0.81 mg/dL).   Medical History: Past Medical History:  Diagnosis Date   Abdominal pain    Arthritis    osteoarthritis-hips,knees, back, hands   Asymmetric septal hypertrophy (HCC)     Atrial fibrillation (HCC)    CAD (coronary artery disease)    2 drug-eluting stents placed in the circumflex leading into a marginal.  May 2017.  [name].   catheterization on 03/19/2018, this showed patent stent in the proximal to mid left circumflex artery, 20% ostial left circumflex artery disease, 40% OM 3 disease, widely patent stent in the ostial D1, 20% ostial to proximal LAD disease, 20% mid LAD disease.   Cataract    corrective surgery done   Fatty liver    Gallstones    GERD (gastroesophageal reflux disease)    Hepatitis    hepatitis A; past hx.,many yrs ago ? food source   Hernia, hiatal    Hyperlipidemia    Hypertension    Obesity    Sleep apnea    CPAP    Assessment: 83yo female c/o SOB and cough, found to be positive for RSV and was in SVT on arrival to ED, known h/o Afib, initial troponin mildly elevated but now rising sharply >> to begin heparin; on Eliquis PTA for Afib, last dose 1/3 9a.  HL supratherapeutic at 0.93 due to effects of Eliquis. aPTT 97 sec on infusion at 1200 units/hr.Hgb 12.1, plt 183. No line issues or signs/symptoms of bleeding reported.  Goal of Therapy:  Heparin level 0.3-0.7 units/ml aPTT 66-102 seconds Monitor platelets by anticoagulation protocol: Yes   Plan:  -Continue heparin drip @  1200 units/hr -Monitor heparin level, aPTT (while Eliquis affects anti-Xa assay), and CBC -Monitor for s/sx of bleeding    Cherylin Mylar, PharmD PGY1 Pharmacy Resident 1/7/20248:33 AM

## 2022-12-09 NOTE — Progress Notes (Signed)
Rounding Note    Patient Name: Victoria Delgado Date of Encounter: 12/09/2022  [facility] Cardiologist: Minus Breeding, MD   Subjective   NAEO. Improved breathing this AM. Husband at bedside.  Inpatient Medications    Scheduled Meds:  benzonatate  100 mg Oral TID   cholecalciferol  1,000 Units Oral BID   clopidogrel  75 mg Oral Daily   doxycycline  100 mg Oral Q12H   ezetimibe  10 mg Oral Daily   famotidine  20 mg Oral Daily   furosemide  40 mg Oral Daily   insulin aspart  0-5 Units Subcutaneous QHS   insulin aspart  0-9 Units Subcutaneous TID WC   levalbuterol  0.63 mg Nebulization Q4H   methylPREDNISolone (SOLU-MEDROL) injection  120 mg Intravenous Q24H   metoprolol succinate  50 mg Oral BID   multivitamin with minerals  1 tablet Oral Once per day on Mon Thu   nystatin   Topical BID   rosuvastatin  20 mg Oral Daily   umeclidinium bromide  1 puff Inhalation Daily   Continuous Infusions:  heparin 1,200 Units/hr (12/08/22 2036)   PRN Meds: acetaminophen **OR** acetaminophen, alum & mag hydroxide-simeth, guaiFENesin-dextromethorphan, ondansetron **OR** ondansetron (ZOFRAN) IV   Vital Signs    Vitals:   12/09/22 0818 12/09/22 0917 12/09/22 1117 12/09/22 1156  BP: (!) 128/57  130/71   Pulse: 76 74 87   Resp: 20 20 18    Temp: 98.8 F (37.1 C)  98.7 F (37.1 C)   TempSrc: Oral  Oral   SpO2: 93% 94% 91% 93%  Weight:      Height:        Intake/Output Summary (Last 24 hours) at 12/09/2022 1249 Last data filed at 12/09/2022 1117 Gross per 24 hour  Intake 1307.47 ml  Output 1400 ml  Net -92.53 ml       12/09/2022    4:41 AM 12/08/2022    5:00 AM 12/07/2022    3:44 AM  Last 3 Weights  Weight (lbs) 273 lb 13 oz 276 lb 9.6 oz 275 lb 5.7 oz  Weight (kg) 124.2 kg 125.465 kg 124.9 kg      Telemetry    sinus - Personally Reviewed  ECG    Personally Reviewed  Physical Exam   GEN: No acute distress.  Mild tachypnea. obese Neck: No JVD Cardiac: RRR,  no audible murmur given significant wheezing.  Respiratory: prolonged Expiratory phase. Wheezing in bilateral lung fields. GI: Soft, nontender, non-distended  MS: No edema; No deformity. Neuro:  Nonfocal  Psych: Normal affect   Labs    High Sensitivity Troponin:   Recent Labs  Lab 12/05/22 2234 12/06/22 0109 12/06/22 0737 12/06/22 0810  TROPONINIHS 24* 747* 1,360* 1,200*      Chemistry Recent Labs  Lab 12/05/22 2234 12/05/22 2240 12/06/22 0810 12/07/22 0625 12/09/22 0637  NA 132*   < > 135 137 140  K 3.2*   < > 3.5 3.6 3.9  CL 99   < > 101 102 101  CO2 22  --  22 27 28   GLUCOSE 183*   < > 270* 211* 211*  BUN 14   < > 11 11 20   CREATININE 1.06*   < > 0.98 0.81 0.72  CALCIUM 8.5*  --  8.3* 8.5* 8.2*  MG 2.1  --  2.0  --   --   PROT 6.8  --  6.2* 6.5  --   ALBUMIN 3.7  --  3.4* 3.3*  --  AST 35  --  46* 40  --   ALT 20  --  23 21  --   ALKPHOS 76  --  67 63  --   BILITOT 0.9  --  0.8 0.9  --   GFRNONAA 53*  --  59* >60 >60  ANIONGAP 11  --  12 8 11    < > = values in this interval not displayed.     Lipids No results for input(s): "CHOL", "TRIG", "HDL", "LABVLDL", "LDLCALC", "CHOLHDL" in the last 168 hours.  Hematology Recent Labs  Lab 12/07/22 0625 12/08/22 0143 12/09/22 0113  WBC 15.7* 14.0* 12.4*  RBC 4.46 4.36 4.31  HGB 12.8 12.4 12.1  HCT 39.3 38.3 39.5  MCV 88.1 87.8 91.6  MCH 28.7 28.4 28.1  MCHC 32.6 32.4 30.6  RDW 17.0* 16.9* 17.0*  PLT 208 218 183    Thyroid No results for input(s): "TSH", "FREET4" in the last 168 hours.  BNP Recent Labs  Lab 12/05/22 2234  BNP 446.5*     DDimer No results for input(s): "DDIMER" in the last 168 hours.   Radiology    No results found.    Assessment & Plan    82 y.o. female with PMH of CAD, AS s/p TAVR 09/2020, moderate MS, HTN, HOCM, OSA, HLD, GI bleed on Eliquis and PAF presented with SOB related to RSV and found to have elevated troponin   #Elevated troponin In setting of AF w/ RVR, likely  type II although need LHC prior to discharge to confirm no significant coronary stenosis. Continue plavix. I do not think she is ready for LHC/RHC yet. Tuesday at the earliest pending improvement in her resp status.  #Severe MS Plan for RHC at time of LHC to assess further. Maintenance of sinus important for hemodynamics.  #pAF Continue heparin gtt Cont metop Low threshold to add back amiodarone if AF recurs     For questions or updates, please contact [facility] Please consult [email or web address] for contact info under        Signed, Thursday, MD  12/09/2022, 12:49 PM

## 2022-12-09 NOTE — Progress Notes (Signed)
Mobility Specialist - Progress Note   12/09/22 1124  Mobility  Activity Ambulated with assistance in room  Level of Assistance Standby assist, set-up cues, supervision of patient - no hands on  Assistive Device None  Distance Ambulated (ft) 80 ft  Activity Response Tolerated well  Mobility Referral Yes  $Mobility charge 1 Mobility    Pt received in recliner. Deferred hallway ambulation but agreeable to walking in room. Took standing break x1, cues given for PLB. Left in recliner w/ call bell in reach and all needs met.   Pre-mobility: SpO2 95% During mobility: SpO2 94% Post-mobility: SpO2 94%  [place] Specialist Please contact via Solicitor or Rehab office at [phone]

## 2022-12-09 NOTE — Progress Notes (Addendum)
PROGRESS NOTE  Victoria Delgado  DOB: [birth year]  PCP: Sueanne Margarita, DO PYP:[id number]  DOA: 12/05/2022  LOS: 3 days  Hospital Day: 5  Brief narrative: Victoria Delgado is a 82 y.o. female with PMH significant for obesity, OSA on CPAP, HTN, HLD, CAD/stents, AS s/p TAVR, paroxysmal A-fib on Eliquis, GI bleed, GERD 1/3, patient presented to the ED with complaint of cough, shortness of breath for 1 week which progressed significantly in the last 3 days. EMS noted O2 sat low at 89% on room air.  She was wheezing, given bronchodilators, started on supplemental oxygen and brought to the ED. She also had low-grade fever, dysuria, N/V/D.  There was possible concern of UTI.  In the ED, patient was afebrile, heart rate 113, breathing on 2 L oxygen by nasal cannula. While in the ED, she had an episode of A-fib with RVR to 198, she was started on amiodarone drip and subsequently converted to normal sinus rhythm. Respiratory panel positive for RSV Chest x-ray showed peribronchial thickening may be bronchitic or congestive.  Patient was started on treatment for COPD exacerbation. Admitted to [facility]  Subjective: Reports epigastric discomfort earlier this morning.  Improved with Mylanta.  O2 requirement also improving, O2 saturation 100% on 1 L at rest.  Still wheezing on exam.  High-dose IV Solu-Medrol, weaning off.  Allergy to Protonix, on famotidine for PPI prophylaxis.  Assessment and plan: Acute exacerbation of COPD  Acute respiratory failure with hypoxia  RSV bronchitis  Presented from home with cough, shortness of breath worsening for a week  RSV positive  Clinical and COPD exacerbation.   Chest x-ray showed bronchitis changes.   She is currently on IV Solu-Medrol 120 mg daily, for wheezing, weaning off, 80 mg daily on 12/10/22. Also on doxycycline day 4 out of 5, Xopenex. Continue incentive spirometer.  Paroxysmal A-fib with RVR On admission cardiology recommended amiodarone drip after which  rhythm converted to normal sinus. Amiodarone drip was subsequently held. Continue metoprolol PTA on Eliquis.  Currently on heparin drip in anticipation for RHC and LHC possibly on Tuesday.  Elevated troponin H/o CAD/stents HLD Troponin was initially 24, later elevated to 747 Heparin drip was initiated in the ED Allergy to aspirin.  Plavix started Continue metoprolol, statin, Zetia, heparin drip No results for input(s): "CKTOTAL", "CKMB", "TROPONINIHS", "RELINDX" in the last 72 hours. Appreciate cardiology's assistance  Chronic diastolic CHF Essential hypertension AS s/p TAVR Continue metoprolol succinate 50 mg twice daily and Lasix 40 mg daily CHF seems compensated.  Blood pressure stable. Strict I's and O's Daily weight Net I&O -583 cc  Prediabetes Hyperglycemia exacerbated with steroids No known history of diabetes.  A1c was elevated to 6.4 in April 2023.  Repeat A1c 7.1 on 12/07/2022. Continue insulin coverage. Lab Results  Component Value Date   HGBA1C 7.1 (H) 12/07/2022   Recent Labs  Lab 12/08/22 1641 12/08/22 2128 12/09/22 0642 12/09/22 1116 12/09/22 1639  GLUCAP 222* 293* 215* 247* 269*   Resolved hypokalemia Potassium level 3.9  Morbid obesity  -Body mass index is 41.63 kg/m. Patient has been advised to make an attempt to improve diet and exercise patterns to aid in weight loss.  OSA  on CPAP    GERD Heartburn, Mylanta given on 12/09/2022 Mylanta as needed for heartburn Continue famotidine Allergy to Protonix  Leukocytosis, likely reactive from steroids Afebrile Monitor for now   Mobility: PT eval.  Mobilize as tolerated.  No PT follow-up was recommended by PT.  Goals of care  Code Status: Full Code    Scheduled Meds:  benzonatate  100 mg Oral TID   cholecalciferol  1,000 Units Oral BID   clopidogrel  75 mg Oral Daily   doxycycline  100 mg Oral Q12H   ezetimibe  10 mg Oral Daily   famotidine  20 mg Oral Daily   furosemide  40 mg Oral  Daily   insulin aspart  0-5 Units Subcutaneous QHS   insulin aspart  0-9 Units Subcutaneous TID WC   levalbuterol  0.63 mg Nebulization Q4H   methylPREDNISolone (SOLU-MEDROL) injection  120 mg Intravenous Q24H   metoprolol succinate  50 mg Oral BID   multivitamin with minerals  1 tablet Oral Once per day on Mon Thu   nystatin   Topical BID   rosuvastatin  20 mg Oral Daily   umeclidinium bromide  1 puff Inhalation Daily    PRN meds: acetaminophen **OR** acetaminophen, alum & mag hydroxide-simeth, guaiFENesin-dextromethorphan, ondansetron **OR** ondansetron (ZOFRAN) IV   Infusions:   heparin 1,200 Units/hr (12/08/22 2036)    Skin assessment:     Nutritional status:  Body mass index is 41.63 kg/m.   Diet:  Diet Order             Diet NPO time specified Except for: Sips with Meds  Diet effective midnight           Diet heart healthy/carb modified Room service appropriate? Yes; Fluid consistency: Thin  Diet effective now                  DVT prophylaxis: Heparin drip   Antimicrobials: Doxycycline Fluid: None Consultants: Cardiology Family Communication: Husband at bedside  Status is: Inpatient  Continue inhospital care because: Respiratory failure Level of care: Progressive   Dispo: The patient is from: Home              Anticipated d/c is to: Home.              Patient currently is not medically stable to d/c.   Difficult to place patient No    Antimicrobials: Anti-infectives (From admission, onward)    Start     Dose/Rate Route Frequency Ordered Stop   12/06/22 1000  doxycycline (VIBRA-TABS) tablet 100 mg        100 mg Oral Every 12 hours 12/06/22 0535 12/11/22 0959       Objective: Vitals:   12/09/22 1506 12/09/22 1641  BP:  (!) 105/55  Pulse:  82  Resp:  20  Temp:  98.7 F (37.1 C)  SpO2: 100% 98%    Intake/Output Summary (Last 24 hours) at 12/09/2022 1723 Last data filed at 12/09/2022 1117 Gross per 24 hour  Intake 275.8 ml  Output 1000  ml  Net -724.2 ml   Filed Weights   12/07/22 0344 12/08/22 0500 12/09/22 0441  Weight: 124.9 kg 125.5 kg 124.2 kg   Weight change: -1.265 kg Body mass index is 41.63 kg/m.   Physical Exam: General exam: Well-developed well-nourished in no acute distress.  She is alert oriented x 3.   Skin: No rash HEENT: Atraumatic, normocephalic, no obvious bleeding Lungs: Diffuse wheezing posteriorly and bilaterally. CVS: Regular rate and rhythm no rubs or gallops. GI/Abd soft, nontender, nondistended, bowel sound present CNS: Alert, awake, oriented x 3 Psychiatry: Mood is appropriate for condition and setting. Extremities: No lower extremity edema bilaterally.  Data Review: I have personally reviewed the laboratory data and studies available.  F/u labs ordered Unresulted Labs (From admission,  onward)     Start     Ordered   12/10/22 0500  Basic metabolic panel  Tomorrow morning,   R       Question:  Specimen collection method  Answer:  Lab=Lab collect   12/09/22 0551   12/07/22 0500  Heparin level (unfractionated)  Daily,   R     See Hyperspace for full Linked Orders Report.   12/06/22 0627   12/07/22 0500  APTT  Daily,   R     See Hyperspace for full Linked Orders Report.   12/06/22 0627   12/07/22 0500  CBC  Daily,   R     See Hyperspace for full Linked Orders Report.   12/06/22 0627            Total time spent in review of labs and imaging, patient evaluation, formulation of plan, documentation and communication with family 45 minutes  Signed, Darlin Drop, MD [facility] 12/09/2022

## 2022-12-10 ENCOUNTER — Inpatient Hospital Stay (HOSPITAL_COMMUNITY): Payer: Medicare Other

## 2022-12-10 ENCOUNTER — Encounter (HOSPITAL_COMMUNITY)
Admission: EM | Disposition: A | Discharge status: Home / Self Care | Payer: Self-pay | Source: Home / Self Care | Attending: Internal Medicine

## 2022-12-10 DIAGNOSIS — J9601 Acute respiratory failure with hypoxia: Secondary | ICD-10-CM | POA: Diagnosis not present

## 2022-12-10 DIAGNOSIS — Z5181 Encounter for therapeutic drug level monitoring: Secondary | ICD-10-CM | POA: Diagnosis not present

## 2022-12-10 DIAGNOSIS — I2510 Atherosclerotic heart disease of native coronary artery without angina pectoris: Secondary | ICD-10-CM | POA: Diagnosis not present

## 2022-12-10 DIAGNOSIS — I480 Paroxysmal atrial fibrillation: Secondary | ICD-10-CM | POA: Diagnosis not present

## 2022-12-10 DIAGNOSIS — B338 Other specified viral diseases: Secondary | ICD-10-CM | POA: Diagnosis not present

## 2022-12-10 DIAGNOSIS — Z7901 Long term (current) use of anticoagulants: Secondary | ICD-10-CM

## 2022-12-10 DIAGNOSIS — I471 Supraventricular tachycardia, unspecified: Secondary | ICD-10-CM | POA: Diagnosis not present

## 2022-12-10 DIAGNOSIS — Z952 Presence of prosthetic heart valve: Secondary | ICD-10-CM | POA: Diagnosis not present

## 2022-12-10 LAB — GLUCOSE, CAPILLARY
Glucose-Capillary: 228 mg/dL — ABNORMAL HIGH (ref 70–99)
Glucose-Capillary: 210 mg/dL — ABNORMAL HIGH (ref 70–99)
Glucose-Capillary: 200 mg/dL — ABNORMAL HIGH (ref 70–99)
Glucose-Capillary: 201 mg/dL — ABNORMAL HIGH (ref 70–99)

## 2022-12-10 LAB — CBC
HCT: 34.9 % — ABNORMAL LOW (ref 36.0–46.0)
Hemoglobin: 11.1 g/dL — ABNORMAL LOW (ref 12.0–15.0)
MCH: 28 pg (ref 26.0–34.0)
MCHC: 31.8 g/dL (ref 30.0–36.0)
MCV: 88.1 fL (ref 80.0–100.0)
Platelets: 240 10*3/uL (ref 150–400)
RBC: 3.96 MIL/uL (ref 3.87–5.11)
RDW: 17.1 % — ABNORMAL HIGH (ref 11.5–15.5)
WBC: 12.8 10*3/uL — ABNORMAL HIGH (ref 4.0–10.5)
nRBC: 0.2 % (ref 0.0–0.2)

## 2022-12-10 LAB — BASIC METABOLIC PANEL
Anion gap: 6 (ref 5–15)
BUN: 23 mg/dL (ref 8–23)
CO2: 28 mmol/L (ref 22–32)
Calcium: 8.3 mg/dL — ABNORMAL LOW (ref 8.9–10.3)
Chloride: 100 mmol/L (ref 98–111)
Creatinine, Ser: 0.83 mg/dL (ref 0.44–1.00)
GFR, Estimated: >60 mL/min (ref >60)
Glucose, Bld: 271 mg/dL — ABNORMAL HIGH (ref 70–99)
Potassium: 4 mmol/L (ref 3.5–5.1)
Sodium: 134 mmol/L — ABNORMAL LOW (ref 135–145)

## 2022-12-10 LAB — HEPARIN LEVEL (UNFRACTIONATED): Heparin Unfractionated: 0.92 IU/mL — ABNORMAL HIGH (ref 0.30–0.70)

## 2022-12-10 LAB — APTT: aPTT: 98 seconds — ABNORMAL HIGH (ref 24–36)

## 2022-12-10 SURGERY — RIGHT/LEFT HEART CATH AND CORONARY ANGIOGRAPHY
Anesthesia: LOCAL

## 2022-12-10 MED ORDER — LEVALBUTEROL HCL 0.63 MG/3ML IN NEBU
0.6300 mg | INHALATION_SOLUTION | Freq: Four times a day (QID) | RESPIRATORY_TRACT | Status: DC
  Administered 2022-12-11: 0.63 mg via RESPIRATORY_TRACT
  Filled 2022-12-10: qty 3

## 2022-12-10 MED ORDER — ASPIRIN 81 MG PO TBEC: 81.0000 mg | DELAYED_RELEASE_TABLET | Freq: Every day | ORAL | Status: DC

## 2022-12-10 MED ORDER — METHYLPREDNISOLONE SODIUM SUCC 40 MG IJ SOLR
40.0000 mg | INTRAMUSCULAR | Status: AC
  Administered 2022-12-11: 40 mg via INTRAVENOUS
  Filled 2022-12-10 (×2): qty 1

## 2022-12-10 MED ORDER — PREDNISONE 20 MG PO TABS: 30.0000 mg | ORAL_TABLET | Freq: Every day | ORAL | Status: DC

## 2022-12-10 MED ORDER — PREDNISONE 10 MG PO TABS: 10.0000 mg | ORAL_TABLET | Freq: Every day | ORAL | Status: DC

## 2022-12-10 MED ORDER — IOHEXOL 350 MG/ML SOLN
75.0000 mL | Freq: Once | INTRAVENOUS | Status: AC | PRN
  Administered 2022-12-10: 75 mL via INTRAVENOUS

## 2022-12-10 MED ORDER — PREDNISONE 20 MG PO TABS: 20.0000 mg | ORAL_TABLET | Freq: Every day | ORAL | Status: DC

## 2022-12-10 MED ORDER — ASPIRIN 81 MG PO TBEC
81.0000 mg | DELAYED_RELEASE_TABLET | Freq: Every day | ORAL | Status: DC
  Administered 2022-12-11 – 2022-12-13 (×3): 81 mg via ORAL
  Filled 2022-12-10 (×3): qty 1

## 2022-12-10 MED ORDER — PREDNISONE 20 MG PO TABS
40.0000 mg | ORAL_TABLET | Freq: Every day | ORAL | Status: DC
  Administered 2022-12-12 – 2022-12-13 (×2): 40 mg via ORAL
  Filled 2022-12-10 (×2): qty 2

## 2022-12-10 MED ORDER — MORPHINE SULFATE (PF) 2 MG/ML IV SOLN
2.0000 mg | INTRAVENOUS | Status: DC | PRN
  Administered 2022-12-10: 2 mg via INTRAVENOUS
  Filled 2022-12-10 (×2): qty 1

## 2022-12-10 MED ORDER — MORPHINE SULFATE (PF) 2 MG/ML IV SOLN
2.0000 mg | Freq: Once | INTRAVENOUS | Status: AC
  Administered 2022-12-10: 2 mg via INTRAVENOUS
  Filled 2022-12-10: qty 1

## 2022-12-10 MED ORDER — DOCUSATE SODIUM 100 MG PO CAPS
100.0000 mg | ORAL_CAPSULE | Freq: Every day | ORAL | Status: DC
  Administered 2022-12-11 – 2022-12-13 (×3): 100 mg via ORAL
  Filled 2022-12-10 (×3): qty 1

## 2022-12-10 NOTE — Progress Notes (Signed)
OT Cancellation Note  Patient Details Name: TRISTAN BRAMBLE MRN: [id number] DOB: [birth year]   Cancelled Treatment:    Reason Eval/Treat Not Completed: Pain limiting ability to participate (OT evalution to f/u when pt is able to participate)  Elliot Cousin 12/10/2022, 10:41 AM

## 2022-12-10 NOTE — Progress Notes (Signed)
  X-cover Note: Asked by bedside RN for in-person evaluation. Pt states she has been complaining of abd pain for last 24-36 hours. Is currently on plavix and IV heparin due to elevated troponin. On scheduled for LHC/RHC today. Pt states she had sudden sharp RLQ pain earlier this evening. KUB shows right colonic stool burden  On exam with fullness in soft tissue of abd wall in RLQ that was painful to palpation.  Concern for rectus sheath hematoma.  Hold heparin gtts. Stat CT abd/pelvis with IV contrast.   Kristopher Oppenheim, DO [facility]

## 2022-12-10 NOTE — Progress Notes (Signed)
PROGRESS NOTE Victoria Delgado  LYY:[id number] DOB: 1941/05/25 DOA: 12/05/2022 PCP: Charlane Ferretti, DO   Brief Narrative/Hospital Course: 82 y.o. female with PMH significant for obesity, OSA on CPAP, HTN, HLD, CAD/stents, AS s/p TAVR, paroxysmal A-fib on Eliquis, GI bleed, GERD presented to the ED 12/05/22 with complaint of cough, shortness of breath for 1 week which progressed significantly in the last 3 days. EMS noted O2 sat low at 89% on room air.  She was wheezing, given bronchodilators, started on supplemental oxygen and brought to the ED.She also had low-grade fever, dysuria, N/V/D.  There was possible concern of UTI. In the ED, patient was afebrile, heart rate 113, breathing on 2 L oxygen by nasal cannula. While in the ED, she had an episode of A-fib with RVR to 198, she was started on amiodarone drip and subsequently converted to normal sinus rhythm. Respiratory panel positive for RSV, LEX:[id number] thickening may be bronchitic or congestive.  Patient admitted for acute COPD exacerbation with RSV bronchitis A-fib with RVR and also elevated troponin     Subjective: Seen and examined Overnight patient noted to have more severe abdominal pain found to have rectus sheath hematoma on CT scan and heparin was held Morphine helped and wanting pain meds afebrile.  Labs reviewed fairly stable CBC BMP. Husband at bedside Not allowing to examine the Rt abdomen due to pain   Assessment and Plan: Principal Problem:   SVT (supraventricular tachycardia)  Acute COPD exacerbation due to RSV bronchitis Acute respiratory failure with hypoxia: Worsening of respiratory status with COPD exacerbation in the setting of RSV infection.  Chest x-ray with bronchitis changes.  Overall improving clinically continue empiric doxycycline, incentive expiratory, aggressive bronchodilators, systemic steroid Solu-Medrol 80 mg every 24, supplemental oxygen and wean as tolerated  Elevated troponin: In the setting of  A-fib with RVR likely type II demand ischemia but per cardiology needs LHC but now on hold per cardio.Continue Plavix, heparin held overnight due to hematoma.  Severe MS: Cardiology with plan for RHC at the time of LHC to assess further.  PAF with RVR: Continue metoprolol rate is controlled.  Low threshold to add back repatriate if AF recurs.  On heparin but held due to rectus sheath hematoma  Abdominal pain with large intramuscular hemorrhage in the right rectus abdominis 12.2x7.2x19.9.  Heparin has been held overnight.  Monitor H&H.  Added morphine for pain control she reports she has nausea with multiple oral formularies Recent Labs  Lab 12/06/22 0810 12/07/22 0625 12/08/22 0143 12/09/22 0113 12/10/22 0111  HGB 12.7 12.8 12.4 12.1 11.1*  HCT 39.3 39.3 38.3 39.5 34.9*     HLD continue statin and Zetia Morbid  Obesity:Patient's Body mass index is 41.57 kg/m. : Will benefit with PCP follow-up, weight loss  healthy lifestyle and outpatient sleep evaluation. \ DVT prophylaxis: SCD added Code Status:   Code Status: Full Code Family Communication: plan of care discussed with patient/husband at bedside. Patient status is: inpatient  because of abdomen pain Level of care: Progressive   Dispo: The patient is from: [place] w/ husband            Anticipated disposition: TBD. ACTIVITY LEVEL 5- NOW UNABLE OT MOVE DUE TO PAIN  Objective: Vitals last 24 hrs: Vitals:   12/10/22 0400 12/10/22 0515 12/10/22 0521 12/10/22 0805  BP:   (!) 161/72   Pulse:   65 74  Resp: 20  19   Temp:    97.7 F (36.5 C)  TempSrc:  Oral  SpO2:   96% 99%  Weight:  124 kg    Height:       Weight change: -0.2 kg  Physical Examination: General exam: alert awake, older than stated age HEENT:Oral mucosa moist, Ear/Nose WNL grossly Respiratory system: bilaterally cleart BS, no use of accessory muscle Cardiovascular system: S1 & S2 +, No JVD. Gastrointestinal system: Abdomen soft,Tender Rt abdomen,ND,  BS+ Nervous System:Alert, awake, moving extremities. Extremities: LE edema neg ,distal peripheral pulses palpable.  Skin: No rashes,no icterus. MSK: Normal muscle bulk,tone, power  Medications reviewed:  Scheduled Meds:  [START ON 12/11/2022] aspirin EC  81 mg Oral Daily   benzonatate  100 mg Oral TID   cholecalciferol  1,000 Units Oral BID   doxycycline  100 mg Oral Q12H   ezetimibe  10 mg Oral Daily   famotidine  40 mg Oral Daily   insulin aspart  0-5 Units Subcutaneous QHS   insulin aspart  0-9 Units Subcutaneous TID WC   levalbuterol  0.63 mg Nebulization Q4H   methylPREDNISolone (SOLU-MEDROL) injection  80 mg Intravenous Q24H   metoprolol succinate  50 mg Oral BID   multivitamin with minerals  1 tablet Oral Once per day on Mon Thu   nystatin   Topical BID   rosuvastatin  20 mg Oral Daily   umeclidinium bromide  1 puff Inhalation Daily   Continuous Infusions:  heparin Stopped (12/10/22 0516)      Diet Order             Diet NPO time specified Except for: Sips with Meds  Diet effective midnight                  Intake/Output Summary (Last 24 hours) at 12/10/2022 0944 Last data filed at 12/09/2022 1735 Gross per 24 hour  Intake 240 ml  Output 300 ml  Net -60 ml   Net IO Since Admission: -343.53 mL [12/10/22 0944]  Wt Readings from Last 3 Encounters:  12/10/22 124 kg  11/09/22 126.1 kg  05/15/22 126.4 kg     Unresulted Labs (From admission, onward)     Start     Ordered   12/07/22 0500  CBC  Daily,   R     See Hyperspace for full Linked Orders Report.   12/06/22 7371           Data Reviewed: I have personally reviewed following labs and imaging studies CBC: Recent Labs  Lab 12/05/22 2234 12/05/22 2240 12/06/22 0810 12/07/22 0625 12/08/22 0143 12/09/22 0113 12/10/22 0111  WBC 9.4  --  7.1 15.7* 14.0* 12.4* 12.8*  NEUTROABS 6.1  --  6.8  --   --   --   --   HGB 13.9   < > 12.7 12.8 12.4 12.1 11.1*  HCT 42.9   < > 39.3 39.3 38.3 39.5 34.9*  MCV  87.9  --  88.1 88.1 87.8 91.6 88.1  PLT 209  --  188 208 218 183 240   < > = values in this interval not displayed.   Basic Metabolic Panel: Recent Labs  Lab 12/05/22 2234 12/05/22 2240 12/06/22 0810 12/07/22 0625 12/09/22 0637 12/10/22 0111  NA 132* 137 135 137 140 134*  K 3.2* 3.5 3.5 3.6 3.9 4.0  CL 99 101 101 102 101 100  CO2 22  --  22 27 28 28   GLUCOSE 183* 182* 270* 211* 211* 271*  BUN 14 15 11 11 20 23   CREATININE 1.06* 1.00 0.98  0.81 0.72 0.83  CALCIUM 8.5*  --  8.3* 8.5* 8.2* 8.3*  MG 2.1  --  2.0  --   --   --   PHOS  --   --  3.4  --   --   --    GFR: Estimated Creatinine Clearance: 76.3 mL/min (by C-G formula based on SCr of 0.83 mg/dL). Liver Function Tests: Recent Labs  Lab 12/05/22 2234 12/06/22 0810 12/07/22 0625  AST 35 46* 40  ALT 20 23 21   ALKPHOS 76 67 63  BILITOT 0.9 0.8 0.9  PROT 6.8 6.2* 6.5  ALBUMIN 3.7 3.4* 3.3*   Recent Labs    12/07/22 1427  HGBA1C 7.1*   CBG: Recent Labs  Lab 12/09/22 0642 12/09/22 1116 12/09/22 1639 12/09/22 2116 12/10/22 0643  GLUCAP 215* 247* 269* 294* 228*   Recent Results (from the past 240 hour(s))  Resp panel by RT-PCR (RSV, Flu A&B, Covid) Anterior Nasal Swab     Status: Abnormal   Collection Time: 12/05/22 10:08 PM   Specimen: Anterior Nasal Swab  Result Value Ref Range Status   SARS Coronavirus 2 by RT PCR NEGATIVE NEGATIVE Final    Comment: (NOTE) SARS-CoV-2 target nucleic acids are NOT DETECTED.  The SARS-CoV-2 RNA is generally detectable in upper respiratory specimens during the acute phase of infection. The lowest concentration of SARS-CoV-2 viral copies this assay can detect is 138 copies/mL. A negative result does not preclude SARS-Cov-2 infection and should not be used as the sole basis for treatment or other patient management decisions. A negative result may occur with  improper specimen collection/handling, submission of specimen other than nasopharyngeal swab, presence of viral  mutation(s) within the areas targeted by this assay, and inadequate number of viral copies(<138 copies/mL). A negative result must be combined with clinical observations, patient history, and epidemiological information. The expected result is Negative.  Fact Sheet for Patients:  EntrepreneurPulse.com.au  Fact Sheet for Healthcare Providers:  IncredibleEmployment.be  This test is no t yet approved or cleared by the [place] FDA and  has been authorized for detection and/or diagnosis of SARS-CoV-2 by FDA under an Emergency Use Authorization (EUA). This EUA will remain  in effect (meaning this test can be used) for the duration of the COVID-19 declaration under Section 564(b)(1) of the Act, 21 U.S.C.section 360bbb-3(b)(1), unless the authorization is terminated  or revoked sooner.       Influenza A by PCR NEGATIVE NEGATIVE Final   Influenza B by PCR NEGATIVE NEGATIVE Final    Comment: (NOTE) The Xpert Xpress SARS-CoV-2/FLU/RSV plus assay is intended as an aid in the diagnosis of influenza from Nasopharyngeal swab specimens and should not be used as a sole basis for treatment. Nasal washings and aspirates are unacceptable for Xpert Xpress SARS-CoV-2/FLU/RSV testing.  Fact Sheet for Patients: EntrepreneurPulse.com.au  Fact Sheet for Healthcare Providers: IncredibleEmployment.be  This test is not yet approved or cleared by the [place] FDA and has been authorized for detection and/or diagnosis of SARS-CoV-2 by FDA under an Emergency Use Authorization (EUA). This EUA will remain in effect (meaning this test can be used) for the duration of the COVID-19 declaration under Section 564(b)(1) of the Act, 21 U.S.C. section 360bbb-3(b)(1), unless the authorization is terminated or revoked.     Resp Syncytial Virus by PCR POSITIVE (A) NEGATIVE Final    Comment: (NOTE) Fact Sheet for  Patients: EntrepreneurPulse.com.au  Fact Sheet for Healthcare Providers: IncredibleEmployment.be  This test is not yet approved or cleared by  the [employer] and has been authorized for detection and/or diagnosis of SARS-CoV-2 by FDA under an Emergency Use Authorization (EUA). This EUA will remain in effect (meaning this test can be used) for the duration of the COVID-19 declaration under Section 564(b)(1) of the Act, 21 U.S.C. section 360bbb-3(b)(1), unless the authorization is terminated or revoked.  Performed at [facility], [address], [place], [place] [address]   Respiratory (~20 pathogens) panel by PCR     Status: Abnormal   Collection Time: 12/05/22 10:08 PM   Specimen: Anterior Nasal Swab; Respiratory  Result Value Ref Range Status   Adenovirus NOT DETECTED NOT DETECTED Final   Coronavirus 229E NOT DETECTED NOT DETECTED Final    Comment: (NOTE) The Coronavirus on the Respiratory Panel, DOES NOT test for the novel  Coronavirus (2019 nCoV)    Coronavirus HKU1 NOT DETECTED NOT DETECTED Final   Coronavirus NL63 NOT DETECTED NOT DETECTED Final   Coronavirus OC43 NOT DETECTED NOT DETECTED Final   Metapneumovirus NOT DETECTED NOT DETECTED Final   Rhinovirus / Enterovirus NOT DETECTED NOT DETECTED Final   Influenza A NOT DETECTED NOT DETECTED Final   Influenza B NOT DETECTED NOT DETECTED Final   Parainfluenza Virus 1 NOT DETECTED NOT DETECTED Final   Parainfluenza Virus 2 NOT DETECTED NOT DETECTED Final   Parainfluenza Virus 3 NOT DETECTED NOT DETECTED Final   Parainfluenza Virus 4 NOT DETECTED NOT DETECTED Final   Respiratory Syncytial Virus DETECTED (A) NOT DETECTED Final   Bordetella pertussis NOT DETECTED NOT DETECTED Final   Bordetella Parapertussis NOT DETECTED NOT DETECTED Final   Chlamydophila pneumoniae NOT DETECTED NOT DETECTED Final   Mycoplasma pneumoniae NOT DETECTED NOT DETECTED Final    Comment: Performed at  [facility], [address] [place], [place] [address]  Urine Culture     Status: Abnormal   Collection Time: 12/06/22  1:09 AM   Specimen: Urine, Clean Catch  Result Value Ref Range Status   Specimen Description URINE, CLEAN CATCH  Final   Special Requests   Final    NONE Performed at [facility], [address] [place], [place] [address]    Culture MULTIPLE SPECIES PRESENT, SUGGEST RECOLLECTION (A)  Final   Report Status 12/07/2022 FINAL  Final  Expectorated Sputum Assessment w Gram Stain, Rflx to Resp Cult     Status: None (Preliminary result)   Collection Time: 12/06/22  5:35 AM   Specimen: Expectorated Sputum  Result Value Ref Range Status   Specimen Description EXPECTORATED SPUTUM  Final   Special Requests NONE  Final   Sputum evaluation   Final    Sputum specimen not acceptable for testing.  Please recollect.   NOTIFIED RN REDEN ON 12/06/22 @ 2247 BY DRT  PREVIOUSLY REPORTED AS: FEW WBC PRESENT,BOTH PMN AND MONONUCLEAR FEW GRAM POSITIVE COCCI FEW GRAM NEGATIVE RODS FEW GRAM POSITIVE RODS Performed at [facility], [address] [place], [place] [address]    Report Status PENDING  Incomplete    Antimicrobials: Anti-infectives (From admission, onward)    Start     Dose/Rate Route Frequency Ordered Stop   12/06/22 1000  doxycycline (VIBRA-TABS) tablet 100 mg        100 mg Oral Every 12 hours 12/06/22 0535 12/11/22 0959      Culture/Microbiology    Component Value Date/Time   SDES EXPECTORATED SPUTUM 12/06/2022 0535   SPECREQUEST NONE 12/06/2022 0535   CULT MULTIPLE SPECIES PRESENT, SUGGEST  RECOLLECTION (A) 12/06/2022 0109   REPTSTATUS PENDING 12/06/2022 0535  Other culture-see note  Radiology Studies: CT ABDOMEN PELVIS W CONTRAST  Result Date: 12/10/2022 CLINICAL DATA:  82 year old female with history of right lower quadrant abdominal pain. Evaluate for abdominal wall hematoma. EXAM: CT ABDOMEN AND PELVIS WITH CONTRAST TECHNIQUE: Multidetector  CT imaging of the abdomen and pelvis was performed using the standard protocol following bolus administration of intravenous contrast. RADIATION DOSE REDUCTION: This exam was performed according to the departmental dose-optimization program which includes automated exposure control, adjustment of the mA and/or kV according to patient size and/or use of iterative reconstruction technique. CONTRAST:  32mL OMNIPAQUE IOHEXOL 350 MG/ML SOLN COMPARISON:  CT of the abdomen and pelvis 12/18/2020. FINDINGS: Lower chest: Scattered areas of cylindrical bronchiectasis, thickening of the peribronchovascular interstitium and patchy peribronchovascular ground-glass attenuation noted throughout the visualized lung bases, suggesting underlying interstitial lung disease. There is also a background of mild centrilobular and paraseptal emphysema. Atherosclerotic calcifications in the descending thoracic aorta as well as the left main, left anterior descending and left circumflex coronary arteries. Severe calcifications of the mitral annulus. Status post TAVR. Hepatobiliary: Diffuse low attenuation throughout the hepatic parenchyma, indicative of a background of hepatic steatosis. No definite suspicious cystic or solid hepatic lesions are confidently identified. Status post cholecystectomy. Pancreas: No pancreatic mass. No pancreatic ductal dilatation. No pancreatic or peripancreatic fluid collections or inflammatory changes. Spleen: Unremarkable. Adrenals/Urinary Tract: 2.3 cm low-attenuation lesion in the interpolar region of the left kidney is compatible with a simple (Bosniak class 1, no imaging follow-up recommended) cysts. Right kidney and left adrenal gland are normal in appearance. 2.2 cm right adrenal nodule, similar to prior studies, previously characterized as a benign adrenal adenoma (no imaging follow-up recommended). No hydroureteronephrosis. Urinary bladder is largely obscured by beam hardening artifact from the patient's  bilateral hip arthroplasties. Stomach/Bowel: The appearance of the stomach is normal. There is no pathologic dilatation of small bowel or colon. Status post appendectomy. Vascular/Lymphatic: Atherosclerosis throughout the abdominal aorta and pelvic vasculature. No lymphadenopathy noted in the abdomen or pelvis. Reproductive: Uterus is atrophic with multiple densely calcified lesions, largest of which is in the fundal region measuring 2.6 cm in diameter, likely fibroids. Ovaries are atrophic. Other: No significant volume of ascites.  No pneumoperitoneum. Musculoskeletal: Throughout the right rectus abdominus musculature there is heterogeneous attenuation with multiple fluid fluid levels, likely to represent intramuscular hemorrhage, estimated to measure approximately 12.2 x 7.2 x 19.9 cm (axial image 69 of series 2 and sagittal image 63 of series 7). Status post bilateral hip arthroplasties. There are no aggressive appearing lytic or blastic lesions noted in the visualized portions of the skeleton. IMPRESSION: 1. Large intramuscular hemorrhage in the right rectus abdominus musculature, as above, estimated to measure approximately 12.2 x 7.2 x 19.9 cm. 2. The appearance of the lung bases suggests either interstitial lung disease or chronic atypical infection. Outpatient referral to Pulmonology for further clinical evaluation and follow-up nonemergent outpatient high-resolution chest CT are suggested in the near future. 3. Aortic atherosclerosis, in addition to left main and 2 vessel coronary artery disease. Assessment for potential risk factor modification, dietary therapy or pharmacologic therapy may be warranted, if clinically indicated. 4. Hepatic steatosis. 5. Small right adrenal adenoma incidentally noted. 6. Additional incidental findings, as above. Electronically Signed   By: Trudie Reed M.D.   On: 12/10/2022 06:37   DG Abd 1 View  Result Date: 12/10/2022 CLINICAL DATA:  Abdominal pain EXAM: ABDOMEN - 1  VIEW COMPARISON:  12/18/2020 abdominal CT FINDINGS: The upper abdomen is not included in the field of view. No obstructive pattern of bowel gas. Coarse calcification the pelvis from fibroid. Moderate stool seen in the right abdomen. Cholecystectomy clips. Bilateral hip replacement. IMPRESSION: No indication of bowel obstruction.  Moderate stool. Electronically Signed   By: Tiburcio Pea M.D.   On: 12/10/2022 04:16     LOS: 4 days   Lanae Boast, MD [facility]  12/10/2022, 9:44 AM

## 2022-12-10 NOTE — Progress Notes (Addendum)
NAME:  Victoria Delgado, MRN:  [id number], DOB:  Sep 13, 1941, LOS: 4 ADMISSION DATE:  12/05/2022, CONSULTATION DATE: 12/06/2018 REFERRING MD: [facility], CHIEF COMPLAINT: Shortness of breath  History of Present Illness:  82 year old female pulmonary patient of Dr. Thora Lance who has a plethora of health issues that are well-documented below and is also carries the allergy to albuterol which creates SVT when taking.  She presents with approximately 2 weeks of increasing respiratory distress, clear sputum production, postnasal drip and now positive for RSV.  And route to the hospital she was given albuterol which created an SVT which is now resolved.  She notes postnasal drip, productive cough with opaque sputum without fevers chills or sweats.  She is positive for RSV.  Pulmonary critical care asked to evaluate.  Pertinent  Medical History   Past Medical History:  Diagnosis Date   Abdominal pain    Arthritis    osteoarthritis-hips,knees, back, hands   Asymmetric septal hypertrophy (HCC)    Atrial fibrillation (HCC)    CAD (coronary artery disease)    2 drug-eluting stents placed in the circumflex leading into a marginal.  May 2017.  [name].   catheterization on 03/19/2018, this showed patent stent in the proximal to mid left circumflex artery, 20% ostial left circumflex artery disease, 40% OM 3 disease, widely patent stent in the ostial D1, 20% ostial to proximal LAD disease, 20% mid LAD disease.   Cataract    corrective surgery done   Fatty liver    Gallstones    GERD (gastroesophageal reflux disease)    Hepatitis    hepatitis A; past hx.,many yrs ago ? food source   Hernia, hiatal    Hyperlipidemia    Hypertension    Obesity    Sleep apnea    CPAP     Significant Hospital Events: Including procedures, antibiotic start and stop dates in addition to other pertinent events   Positive RSV  Interim History / Subjective:  Feels better  Objective   Blood pressure (Abnormal) 161/72, pulse  74, temperature 97.7 F (36.5 C), temperature source Oral, resp. rate 19, height 5\' 8"  (1.727 m), weight 124 kg, SpO2 99 %.        Intake/Output Summary (Last 24 hours) at 12/10/2022 1110 Last data filed at 12/09/2022 1735 Gross per 24 hour  Intake 240 ml  Output 300 ml  Net -60 ml   Filed Weights   12/08/22 0500 12/09/22 0441 12/10/22 0515  Weight: 125.5 kg 124.2 kg 124 kg    Examination: General resting in bed. No distress but does report sig abd Pain Pulm clear now on 1 lpm Card rrr Abd soft very tender to touch especially RLQ Ext warm and dry  Neuro intact  Resolved Hospital Problem list     Assessment & Plan:  RSV bronchitis w/ associated acute hypoxic resp failure Atrial fib SVT S/p AVR (had been on DOAC) CAD/STEMI  Rectus sheath hematoma  MO   Pulm problem list:  RSV bronchitis w/ associated acute hypoxic resp failure, complicated further by MO.  -treating as AECOPD Plan Cont systemic steroids w/ slow taper. Can decrease to 40mg /d starting tomorrow, then oral pred the following day (written in orders) Cont scheduled BDs Wean O2 Mobilize as tolerated   NSTEMI c/ known CAD w/ prior stents. Now c/b abd hematoma on [facility] Plan Left and right heart cath on hold Rate control for her AD Monitor H&H  Best Practice (right click and "Reselect all SmartList Selections" daily)  Diet/type: Regular consistency (see orders) DVT prophylaxis: SCD GI prophylaxis: PPI Lines: N/A Foley:  N/A Code Status:  full code Last date of multidisciplinary goals of care discussion [tbd]  Erick Colace ACNP-BC [place] Pager # [phone] OR # [phone] if no answer

## 2022-12-10 NOTE — Progress Notes (Signed)
ASA 81mg  recommended by Dr Johnsie Cancel.

## 2022-12-10 NOTE — Progress Notes (Signed)
Patient refused CPAP for the night  

## 2022-12-10 NOTE — Care Management Important Message (Signed)
Important Message  Patient Details  Name: Victoria Delgado MRN: [id number] Date of Birth: 22-Feb-1941   Medicare Important Message Given:  Yes     [name] 12/10/2022, 7:57 AM

## 2022-12-10 NOTE — Progress Notes (Signed)
PT Cancellation Note  Patient Details Name: Victoria Delgado MRN: [id number] DOB: 1941-03-11   Cancelled Treatment:    Reason Eval/Treat Not Completed: Other (comment) Pt politely declining due to severe abdominal pain.  Wyona Almas, PT, DPT Acute Rehabilitation Services Office [place] 12/10/2022, 10:11 AM

## 2022-12-10 NOTE — Inpatient Diabetes Management (Signed)
Inpatient Diabetes Program Recommendations  AACE/ADA: New Consensus Statement on Inpatient Glycemic Control (2015)  Target Ranges:  Prepandial:   less than 140 mg/dL      Peak postprandial:   less than 180 mg/dL (1-2 hours)      Critically ill patients:  140 - 180 mg/dL   Lab Results  Component Value Date   GLUCAP 210 (H) 12/10/2022   HGBA1C 7.1 (H) 12/07/2022    Review of Glycemic Control  Latest Reference Range & Units 12/09/22 06:42 12/09/22 11:16 12/09/22 16:39 12/09/22 21:16 12/10/22 06:43 12/10/22 13:05  Glucose-Capillary 70 - 99 mg/dL 215 (H) 247 (H) 269 (H) 294 (H) 228 (H) 210 (H)  (H): Data is abnormally high  Diabetes history: Pre-DM Outpatient Diabetes medications: none Current orders for Inpatient glycemic control: Novolog 0-9 units TID and 0-5 units QHS  Inpatient Diabetes Program Recommendations:    While receiving steroids, might consider:  Novolog 0-20 units TID and HS  Will continue to follow while inpatient.  Thank you, Reche Dixon, MSN, [place] Diabetes Coordinator Inpatient Diabetes Program [phone] (team pager from 8a-5p)

## 2022-12-10 NOTE — Progress Notes (Signed)
  X-cover Note: CT abd/pelvis shows rectus sheath hematoma.  Heparin gtts stopped.     Kristopher Oppenheim, DO [facility]

## 2022-12-10 NOTE — Hospital Course (Addendum)
83 y.o. female with PMH significant for obesity, OSA on CPAP, HTN, HLD, CAD/stents, AS s/p TAVR, paroxysmal A-fib on Eliquis, GI bleed, GERD presented to the ED 12/05/22 with complaint of cough, shortness of breath for 1 week which progressed significantly in the last 3 days. EMS noted O2 sat low at 89% on room air.  She was wheezing, given bronchodilators, started on supplemental oxygen and brought to the ED.She also had low-grade fever, dysuria, N/V/D.  There was possible concern of UTI. In the ED, patient was afebrile, heart rate 113, breathing on 2 L oxygen by nasal cannula. While in the ED, she had an episode of A-fib with RVR to 198, she was started on amiodarone drip and subsequently converted to normal sinus rhythm. Respiratory panel positive for RSV, KGU:[id number] thickening may be bronchitic or congestive.  Patient admitted for acute COPD exacerbation with RSV bronchitis A-fib with RVR and also elevated troponin

## 2022-12-10 NOTE — Progress Notes (Signed)
Patient complaint of severe sharp abdominal pain, specifically in RLQ. Pain is more severe compare earlier(Mylanta given) with out relief.Dr. Bridgett Larsson made aware with order,KUB  done,V/S stable.Will continue to monitor.Victoria Delgado

## 2022-12-10 NOTE — Progress Notes (Addendum)
Rounding Note    Patient Name: Victoria Delgado Date of Encounter: 12/10/2022  [facility] Cardiologist: Rollene Rotunda, MD   Subjective   She is in tears, c/o RLQ abdominal pain since last night, reports improved breathing, denied ever having chest pain.   Inpatient Medications    Scheduled Meds:  benzonatate  100 mg Oral TID   cholecalciferol  1,000 Units Oral BID   doxycycline  100 mg Oral Q12H   ezetimibe  10 mg Oral Daily   famotidine  40 mg Oral Daily   insulin aspart  0-5 Units Subcutaneous QHS   insulin aspart  0-9 Units Subcutaneous TID WC   levalbuterol  0.63 mg Nebulization Q4H   methylPREDNISolone (SOLU-MEDROL) injection  80 mg Intravenous Q24H   metoprolol succinate  50 mg Oral BID   multivitamin with minerals  1 tablet Oral Once per day on Mon Thu   nystatin   Topical BID   rosuvastatin  20 mg Oral Daily   umeclidinium bromide  1 puff Inhalation Daily   Continuous Infusions:  heparin Stopped (12/10/22 0516)   PRN Meds: acetaminophen **OR** acetaminophen, alum & mag hydroxide-simeth, guaiFENesin-dextromethorphan, ondansetron **OR** ondansetron (ZOFRAN) IV   Vital Signs    Vitals:   12/10/22 0400 12/10/22 0515 12/10/22 0521 12/10/22 0805  BP:   (!) 161/72   Pulse:   65 74  Resp: 20  19   Temp:    97.7 F (36.5 C)  TempSrc:    Oral  SpO2:   96% 99%  Weight:  124 kg    Height:        Intake/Output Summary (Last 24 hours) at 12/10/2022 0825 Last data filed at 12/09/2022 1735 Gross per 24 hour  Intake 480 ml  Output 600 ml  Net -120 ml      12/10/2022    5:15 AM 12/09/2022    4:41 AM 12/08/2022    5:00 AM  Last 3 Weights  Weight (lbs) 273 lb 5.9 oz 273 lb 13 oz 276 lb 9.6 oz  Weight (kg) 124 kg 124.2 kg 125.465 kg      Telemetry    Sinus rhythm  - Personally Reviewed  ECG    N/A today - Personally Reviewed  Physical Exam   GEN: No acute distress.   Neck: No JVD Cardiac: RRR, soft murmur noted  Respiratory: Clear to auscultation  bilaterally. On 2LNC , pox 96% GI: Soft, RLQ tenderness, voluntary guarding, not distended  MS: No leg edema  Neuro:  Nonfocal  Psych: anxious   Labs    High Sensitivity Troponin:   Recent Labs  Lab 12/05/22 2234 12/06/22 0109 12/06/22 0737 12/06/22 0810  TROPONINIHS 24* 747* 1,360* 1,200*     Chemistry Recent Labs  Lab 12/05/22 2234 12/05/22 2240 12/06/22 0810 12/07/22 0625 12/09/22 0637 12/10/22 0111  NA 132*   < > 135 137 140 134*  K 3.2*   < > 3.5 3.6 3.9 4.0  CL 99   < > 101 102 101 100  CO2 22  --  22 27 28 28   GLUCOSE 183*   < > 270* 211* 211* 271*  BUN 14   < > 11 11 20 23   CREATININE 1.06*   < > 0.98 0.81 0.72 0.83  CALCIUM 8.5*  --  8.3* 8.5* 8.2* 8.3*  MG 2.1  --  2.0  --   --   --   PROT 6.8  --  6.2* 6.5  --   --  ALBUMIN 3.7  --  3.4* 3.3*  --   --   AST 35  --  46* 40  --   --   ALT 20  --  23 21  --   --   ALKPHOS 76  --  67 63  --   --   BILITOT 0.9  --  0.8 0.9  --   --   GFRNONAA 53*  --  59* >60 >60 >60  ANIONGAP 11  --  12 8 11 6    < > = values in this interval not displayed.    Lipids No results for input(s): "CHOL", "TRIG", "HDL", "LABVLDL", "LDLCALC", "CHOLHDL" in the last 168 hours.  Hematology Recent Labs  Lab 12/08/22 0143 12/09/22 0113 12/10/22 0111  WBC 14.0* 12.4* 12.8*  RBC 4.36 4.31 3.96  HGB 12.4 12.1 11.1*  HCT 38.3 39.5 34.9*  MCV 87.8 91.6 88.1  MCH 28.4 28.1 28.0  MCHC 32.4 30.6 31.8  RDW 16.9* 17.0* 17.1*  PLT 218 183 240   Thyroid No results for input(s): "TSH", "FREET4" in the last 168 hours.  BNP Recent Labs  Lab 12/05/22 2234  BNP 446.5*    DDimer No results for input(s): "DDIMER" in the last 168 hours.   Radiology    CT ABDOMEN PELVIS W CONTRAST  Result Date: 12/10/2022 CLINICAL DATA:  82 year old female with history of right lower quadrant abdominal pain. Evaluate for abdominal wall hematoma. EXAM: CT ABDOMEN AND PELVIS WITH CONTRAST TECHNIQUE: Multidetector CT imaging of the abdomen and pelvis was  performed using the standard protocol following bolus administration of intravenous contrast. RADIATION DOSE REDUCTION: This exam was performed according to the departmental dose-optimization program which includes automated exposure control, adjustment of the mA and/or kV according to patient size and/or use of iterative reconstruction technique. CONTRAST:  86mL OMNIPAQUE IOHEXOL 350 MG/ML SOLN COMPARISON:  CT of the abdomen and pelvis 12/18/2020. FINDINGS: Lower chest: Scattered areas of cylindrical bronchiectasis, thickening of the peribronchovascular interstitium and patchy peribronchovascular ground-glass attenuation noted throughout the visualized lung bases, suggesting underlying interstitial lung disease. There is also a background of mild centrilobular and paraseptal emphysema. Atherosclerotic calcifications in the descending thoracic aorta as well as the left main, left anterior descending and left circumflex coronary arteries. Severe calcifications of the mitral annulus. Status post TAVR. Hepatobiliary: Diffuse low attenuation throughout the hepatic parenchyma, indicative of a background of hepatic steatosis. No definite suspicious cystic or solid hepatic lesions are confidently identified. Status post cholecystectomy. Pancreas: No pancreatic mass. No pancreatic ductal dilatation. No pancreatic or peripancreatic fluid collections or inflammatory changes. Spleen: Unremarkable. Adrenals/Urinary Tract: 2.3 cm low-attenuation lesion in the interpolar region of the left kidney is compatible with a simple (Bosniak class 1, no imaging follow-up recommended) cysts. Right kidney and left adrenal gland are normal in appearance. 2.2 cm right adrenal nodule, similar to prior studies, previously characterized as a benign adrenal adenoma (no imaging follow-up recommended). No hydroureteronephrosis. Urinary bladder is largely obscured by beam hardening artifact from the patient's bilateral hip arthroplasties.  Stomach/Bowel: The appearance of the stomach is normal. There is no pathologic dilatation of small bowel or colon. Status post appendectomy. Vascular/Lymphatic: Atherosclerosis throughout the abdominal aorta and pelvic vasculature. No lymphadenopathy noted in the abdomen or pelvis. Reproductive: Uterus is atrophic with multiple densely calcified lesions, largest of which is in the fundal region measuring 2.6 cm in diameter, likely fibroids. Ovaries are atrophic. Other: No significant volume of ascites.  No pneumoperitoneum. Musculoskeletal: Throughout the right rectus  abdominus musculature there is heterogeneous attenuation with multiple fluid fluid levels, likely to represent intramuscular hemorrhage, estimated to measure approximately 12.2 x 7.2 x 19.9 cm (axial image 69 of series 2 and sagittal image 63 of series 7). Status post bilateral hip arthroplasties. There are no aggressive appearing lytic or blastic lesions noted in the visualized portions of the skeleton. IMPRESSION: 1. Large intramuscular hemorrhage in the right rectus abdominus musculature, as above, estimated to measure approximately 12.2 x 7.2 x 19.9 cm. 2. The appearance of the lung bases suggests either interstitial lung disease or chronic atypical infection. Outpatient referral to Pulmonology for further clinical evaluation and follow-up nonemergent outpatient high-resolution chest CT are suggested in the near future. 3. Aortic atherosclerosis, in addition to left main and 2 vessel coronary artery disease. Assessment for potential risk factor modification, dietary therapy or pharmacologic therapy may be warranted, if clinically indicated. 4. Hepatic steatosis. 5. Small right adrenal adenoma incidentally noted. 6. Additional incidental findings, as above. Electronically Signed   By: Trudie Reed M.D.   On: 12/10/2022 06:37   DG Abd 1 View  Result Date: 12/10/2022 CLINICAL DATA:  Abdominal pain EXAM: ABDOMEN - 1 VIEW COMPARISON:  12/18/2020  abdominal CT FINDINGS: The upper abdomen is not included in the field of view. No obstructive pattern of bowel gas. Coarse calcification the pelvis from fibroid. Moderate stool seen in the right abdomen. Cholecystectomy clips. Bilateral hip replacement. IMPRESSION: No indication of bowel obstruction.  Moderate stool. Electronically Signed   By: Tiburcio Pea M.D.   On: 12/10/2022 04:16    Cardiac Studies   Echo from 12/06/22:   1. Left ventricular ejection fraction, by estimation, is 70 to 75%. The  left ventricle has hyperdynamic function. The left ventricle has no  regional wall motion abnormalities. There is moderate left ventricular  hypertrophy. Left ventricular diastolic  parameters are indeterminate.   2. Resting peak LVOT gradient 84 mmHg   3. Right ventricular systolic function is normal. The right ventricular  size is normal. Tricuspid regurgitation signal is inadequate for assessing  PA pressure.   4. Left atrial size was mildly dilated.   5. A small pericardial effusion is present.   6. The mitral valve is degenerative. Trivial mitral valve regurgitation.  Severe mitral stenosis. The mean mitral valve gradient is 16.0 mmHg with  average heart rate of 82 bpm. Severe mitral annular calcification.   7. The aortic valve has been repaired/replaced. Aortic valve  regurgitation is not visualized. There is a 23 mm Edwards Sapien  prosthetic (TAVR) valve present in the aortic position. Procedure Date:  05/14/22. Vmax 3.2 m/s, MG 20 mmHg, EOA 2.2 cm^2, DI  0.58   Patient Profile     82 y.o. female with PMH of CAD s/p stent to D1 and mLCX, AS s/p TAVR 09/2020 at [facility], diastolic heart failure, moderate mitral stenosis, HTN, HOCM, OSA, HLD, hx of GI bleed on Eliquis, paroxysmal A fib on Eliquis , who is admitted with acute hypoxic respiratory failure 2/2 RSV and  COPD exacerbation, course is complicated by NSTEMI, A fib RVR. Cardiology is following, planned for L and R heart cath 12/10/22,  howver, CTAP today showed rectus sheath hematoma.   Assessment & Plan    NSTEMI CAD with hx of stents D1 and mLCx - presented with acute hypoxic respiratory failure 2/2 RSV and  COPD exacerbation - Hs trop 24 >747 >1610>9604 - EKG with non-specific changes  - Last cath from 07/11/20 pre-TAVR, patent stents, moderate pulmonary  hypertension with mean PA pressure 38 mmHg, mean wedge pressure 27 mmHg, PVR 1.6 Wood units  - Echo from 12/06/22 LVEF 70-75%, no RWMA, severe MS mean mitral valve gradient is 16.0 mmHg, s/p TAVR with Vmax 3.2 m/s, MG 20 mmHg, EOA 2.2 cm^2, DI 0.58  - Suspect this is demand ischemia, R and L heart cath was planned previously, however, patient had ongoing abdominal pain for 24-36 hours and  CTAP today showed large intramuscular hemorrhage in the right rectus abdominus musculature 12.2 x 7.2 x 19.9 cm. Agree with stop anticoagulation with heparin gtt, will cancel R and L heart cath today, consider serial CT and surgery consult if indicated.  - Medical therapy: HOLD heparin gtt/PTA Eliquis, HOLD Plavix (placed this admission), continue Toprolol XL, crestor  Severe mitral stenosis  - RHC was planned but cancelled today, will need to re-consider once patient is stable, can be done outpatient   Paroxsymal A fib RVR - remains in SR now, likely due to acute infection initially - continue metoprolol - unable anticoagulate at this time due to rectus sheath hematoma - may use short course of amiodarone if RVR occurs  - keep K >4 and Mag >2   Hx of aortic stenosis s/p TAVR 2021 at [facility] - Echo with function valve   Chronic diastolic heart failure - clinically volume neutral, no diuresis indicated, will hold Lasix 40mg  daily now given hematoma  - GDMT: Toprolol XL, may add SGLT2I/MRA later when she is stable   Acute rectus sheath hematoma with pain - Per CTAP today, agree with stop anticoagulation, consider pain management , serial H&H, surgery consult, and serial CT  Acute  hypoxic respiratory failure  RSV infection COPD exacerbation  Pre-diabetes  HLD  OSA GERD Morbid obesity  - per primary team      For questions or updates, please contact [place] Please consult [email or web address] for contact info under        Signed, Margie Billet, NP  12/10/2022, 8:25 AM    Patient examined chart reviewed Discussed care with patient husband and PA Husband in room with RSV as well and coughing. Patient with considerable discomfort from rectus sheat hematoma No rebound on exam Obese female with distant heart sounds SEM through TAVR valve. She does not need cath at this point. Continue 81 mg ASA Stents to LCX/D1 patent prior to TAVR 07/2020. She has had GI bleed on eliquis and now rectus sheath hematoma Fortunately she is in NSR now. Hold Eliquis for 2 weeks Outpatient f/u with Dr Percival Spanish Should see Dr Quentin Ore to consider Watchman. D/c planning based on pulmonary status with RSV and 48 hr monitoring of Hb/Hct and abdominal exam with rectus sheath hematoma. Would consider f/u CT abdomen in 2 weeks prior to resuming eliquis.   Jenkins Rouge MD [facility]

## 2022-12-11 DIAGNOSIS — Z7901 Long term (current) use of anticoagulants: Secondary | ICD-10-CM | POA: Diagnosis not present

## 2022-12-11 DIAGNOSIS — I480 Paroxysmal atrial fibrillation: Secondary | ICD-10-CM | POA: Diagnosis not present

## 2022-12-11 DIAGNOSIS — Z5181 Encounter for therapeutic drug level monitoring: Secondary | ICD-10-CM | POA: Diagnosis not present

## 2022-12-11 DIAGNOSIS — I471 Supraventricular tachycardia, unspecified: Secondary | ICD-10-CM | POA: Diagnosis not present

## 2022-12-11 LAB — BASIC METABOLIC PANEL
Anion gap: 9 (ref 5–15)
BUN: 35 mg/dL — ABNORMAL HIGH (ref 8–23)
CO2: 27 mmol/L (ref 22–32)
Calcium: 8 mg/dL — ABNORMAL LOW (ref 8.9–10.3)
Chloride: 97 mmol/L — ABNORMAL LOW (ref 98–111)
Creatinine, Ser: 0.96 mg/dL (ref 0.44–1.00)
GFR, Estimated: >60 mL/min (ref >60)
Glucose, Bld: 194 mg/dL — ABNORMAL HIGH (ref 70–99)
Potassium: 4 mmol/L (ref 3.5–5.1)
Sodium: 133 mmol/L — ABNORMAL LOW (ref 135–145)

## 2022-12-11 LAB — GLUCOSE, CAPILLARY
Glucose-Capillary: 223 mg/dL — ABNORMAL HIGH (ref 70–99)
Glucose-Capillary: 285 mg/dL — ABNORMAL HIGH (ref 70–99)
Glucose-Capillary: 228 mg/dL — ABNORMAL HIGH (ref 70–99)
Glucose-Capillary: 178 mg/dL — ABNORMAL HIGH (ref 70–99)

## 2022-12-11 LAB — URINALYSIS, ROUTINE W REFLEX MICROSCOPIC
Bilirubin Urine: NEGATIVE
Glucose, UA: NEGATIVE mg/dL
Hgb urine dipstick: NEGATIVE
Ketones, ur: NEGATIVE mg/dL
Nitrite: NEGATIVE
Protein, ur: NEGATIVE mg/dL
Specific Gravity, Urine: 1.018 (ref 1.005–1.030)
pH: 5 (ref 5.0–8.0)

## 2022-12-11 LAB — CBC
HCT: 30.4 % — ABNORMAL LOW (ref 36.0–46.0)
Hemoglobin: 9.5 g/dL — ABNORMAL LOW (ref 12.0–15.0)
MCH: 27.9 pg (ref 26.0–34.0)
MCHC: 31.3 g/dL (ref 30.0–36.0)
MCV: 89.1 fL (ref 80.0–100.0)
Platelets: 285 10*3/uL (ref 150–400)
RBC: 3.41 MIL/uL — ABNORMAL LOW (ref 3.87–5.11)
RDW: 17.3 % — ABNORMAL HIGH (ref 11.5–15.5)
WBC: 18.5 10*3/uL — ABNORMAL HIGH (ref 4.0–10.5)
nRBC: 1.9 % — ABNORMAL HIGH (ref 0.0–0.2)

## 2022-12-11 MED ORDER — LEVALBUTEROL HCL 0.63 MG/3ML IN NEBU
0.6300 mg | INHALATION_SOLUTION | Freq: Three times a day (TID) | RESPIRATORY_TRACT | Status: DC
  Administered 2022-12-11 – 2022-12-13 (×6): 0.63 mg via RESPIRATORY_TRACT
  Filled 2022-12-11 (×6): qty 3

## 2022-12-11 NOTE — Progress Notes (Addendum)
Rounding Note    Patient Name: Victoria Delgado Date of Encounter: 12/11/2022  [facility] Cardiologist: Rollene Rotunda, MD   Subjective   Patient reports feeling much better today. Pain in abdomen more mild today. No chest pain. Breathing has improved.   Inpatient Medications    Scheduled Meds:  aspirin EC  81 mg Oral Daily   benzonatate  100 mg Oral TID   cholecalciferol  1,000 Units Oral BID   docusate sodium  100 mg Oral Daily   ezetimibe  10 mg Oral Daily   famotidine  40 mg Oral Daily   insulin aspart  0-5 Units Subcutaneous QHS   insulin aspart  0-9 Units Subcutaneous TID WC   levalbuterol  0.63 mg Nebulization QID   metoprolol succinate  50 mg Oral BID   multivitamin with minerals  1 tablet Oral Once per day on Mon Thu   nystatin   Topical BID   [START ON 12/12/2022] predniSONE  40 mg Oral Q breakfast   Followed by   Melene Muller ON 12/15/2022] predniSONE  30 mg Oral Q breakfast   Followed by   Melene Muller ON 12/18/2022] predniSONE  20 mg Oral Q breakfast   Followed by   Melene Muller ON 12/21/2022] predniSONE  10 mg Oral Q breakfast   rosuvastatin  20 mg Oral Daily   umeclidinium bromide  1 puff Inhalation Daily   Continuous Infusions:  PRN Meds: acetaminophen **OR** acetaminophen, alum & mag hydroxide-simeth, guaiFENesin-dextromethorphan, morphine injection, ondansetron **OR** ondansetron (ZOFRAN) IV   Vital Signs    Vitals:   12/10/22 2344 12/11/22 0024 12/11/22 0344 12/11/22 0747  BP: 102/61  116/66   Pulse: 74 75 76   Resp: 20 (!) 21 20   Temp: 98.3 F (36.8 C)  97.7 F (36.5 C)   TempSrc: Axillary  Axillary   SpO2: 92% 90% 92% 96%  Weight:  126.1 kg 127 kg   Height:        Intake/Output Summary (Last 24 hours) at 12/11/2022 0835 Last data filed at 12/11/2022 9562 Gross per 24 hour  Intake 600 ml  Output 550 ml  Net 50 ml      12/11/2022    3:44 AM 12/11/2022   12:24 AM 12/10/2022    5:15 AM  Last 3 Weights  Weight (lbs) 279 lb 15.8 oz 278 lb 273 lb 5.9  oz  Weight (kg) 127 kg 126.1 kg 124 kg      Telemetry    Normal sinus rhythm - Personally Reviewed  ECG    No new tracings since 1/3 - Personally Reviewed  Physical Exam   GEN: No acute distress. Laying comfortably in the bed with head elevated  Neck: No JVD Cardiac: RRR. Mechanical S2 click present. Grade 2/6 diastolic murmur at apex  Respiratory: Expiratory wheezing heard throughout. Normal WOB on room air  GI: Abdomen is tender to palpation in right upper and lower quadrants  MS: No edema in BLE  Neuro:  Nonfocal  Psych: Normal affect   Labs    High Sensitivity Troponin:   Recent Labs  Lab 12/05/22 2234 12/06/22 0109 12/06/22 0737 12/06/22 0810  TROPONINIHS 24* 747* 1,360* 1,200*     Chemistry Recent Labs  Lab 12/05/22 2234 12/05/22 2240 12/06/22 0810 12/07/22 0625 12/09/22 0637 12/10/22 0111 12/11/22 0137  NA 132*   < > 135 137 140 134* 133*  K 3.2*   < > 3.5 3.6 3.9 4.0 4.0  CL 99   < >  101 102 101 100 97*  CO2 22  --  22 27 28 28 27   GLUCOSE 183*   < > 270* 211* 211* 271* 194*  BUN 14   < > 11 11 20 23  35*  CREATININE 1.06*   < > 0.98 0.81 0.72 0.83 0.96  CALCIUM 8.5*  --  8.3* 8.5* 8.2* 8.3* 8.0*  MG 2.1  --  2.0  --   --   --   --   PROT 6.8  --  6.2* 6.5  --   --   --   ALBUMIN 3.7  --  3.4* 3.3*  --   --   --   AST 35  --  46* 40  --   --   --   ALT 20  --  23 21  --   --   --   ALKPHOS 76  --  67 63  --   --   --   BILITOT 0.9  --  0.8 0.9  --   --   --   GFRNONAA 53*  --  59* >60 >60 >60 >60  ANIONGAP 11  --  12 8 11 6 9    < > = values in this interval not displayed.    Lipids No results for input(s): "CHOL", "TRIG", "HDL", "LABVLDL", "LDLCALC", "CHOLHDL" in the last 168 hours.  Hematology Recent Labs  Lab 12/09/22 0113 12/10/22 0111 12/11/22 0137  WBC 12.4* 12.8* 18.5*  RBC 4.31 3.96 3.41*  HGB 12.1 11.1* 9.5*  HCT 39.5 34.9* 30.4*  MCV 91.6 88.1 89.1  MCH 28.1 28.0 27.9  MCHC 30.6 31.8 31.3  RDW 17.0* 17.1* 17.3*  PLT 183 240  285   Thyroid No results for input(s): "TSH", "FREET4" in the last 168 hours.  BNP Recent Labs  Lab 12/05/22 2234  BNP 446.5*    DDimer No results for input(s): "DDIMER" in the last 168 hours.   Radiology    CT ABDOMEN PELVIS W CONTRAST  Result Date: 12/10/2022 CLINICAL DATA:  82 year old female with history of right lower quadrant abdominal pain. Evaluate for abdominal wall hematoma. EXAM: CT ABDOMEN AND PELVIS WITH CONTRAST TECHNIQUE: Multidetector CT imaging of the abdomen and pelvis was performed using the standard protocol following bolus administration of intravenous contrast. RADIATION DOSE REDUCTION: This exam was performed according to the departmental dose-optimization program which includes automated exposure control, adjustment of the mA and/or kV according to patient size and/or use of iterative reconstruction technique. CONTRAST:  30mL OMNIPAQUE IOHEXOL 350 MG/ML SOLN COMPARISON:  CT of the abdomen and pelvis 12/18/2020. FINDINGS: Lower chest: Scattered areas of cylindrical bronchiectasis, thickening of the peribronchovascular interstitium and patchy peribronchovascular ground-glass attenuation noted throughout the visualized lung bases, suggesting underlying interstitial lung disease. There is also a background of mild centrilobular and paraseptal emphysema. Atherosclerotic calcifications in the descending thoracic aorta as well as the left main, left anterior descending and left circumflex coronary arteries. Severe calcifications of the mitral annulus. Status post TAVR. Hepatobiliary: Diffuse low attenuation throughout the hepatic parenchyma, indicative of a background of hepatic steatosis. No definite suspicious cystic or solid hepatic lesions are confidently identified. Status post cholecystectomy. Pancreas: No pancreatic mass. No pancreatic ductal dilatation. No pancreatic or peripancreatic fluid collections or inflammatory changes. Spleen: Unremarkable. Adrenals/Urinary Tract: 2.3  cm low-attenuation lesion in the interpolar region of the left kidney is compatible with a simple (Bosniak class 1, no imaging follow-up recommended) cysts. Right kidney and left adrenal gland are normal in  appearance. 2.2 cm right adrenal nodule, similar to prior studies, previously characterized as a benign adrenal adenoma (no imaging follow-up recommended). No hydroureteronephrosis. Urinary bladder is largely obscured by beam hardening artifact from the patient's bilateral hip arthroplasties. Stomach/Bowel: The appearance of the stomach is normal. There is no pathologic dilatation of small bowel or colon. Status post appendectomy. Vascular/Lymphatic: Atherosclerosis throughout the abdominal aorta and pelvic vasculature. No lymphadenopathy noted in the abdomen or pelvis. Reproductive: Uterus is atrophic with multiple densely calcified lesions, largest of which is in the fundal region measuring 2.6 cm in diameter, likely fibroids. Ovaries are atrophic. Other: No significant volume of ascites.  No pneumoperitoneum. Musculoskeletal: Throughout the right rectus abdominus musculature there is heterogeneous attenuation with multiple fluid fluid levels, likely to represent intramuscular hemorrhage, estimated to measure approximately 12.2 x 7.2 x 19.9 cm (axial image 69 of series 2 and sagittal image 63 of series 7). Status post bilateral hip arthroplasties. There are no aggressive appearing lytic or blastic lesions noted in the visualized portions of the skeleton. IMPRESSION: 1. Large intramuscular hemorrhage in the right rectus abdominus musculature, as above, estimated to measure approximately 12.2 x 7.2 x 19.9 cm. 2. The appearance of the lung bases suggests either interstitial lung disease or chronic atypical infection. Outpatient referral to Pulmonology for further clinical evaluation and follow-up nonemergent outpatient high-resolution chest CT are suggested in the near future. 3. Aortic atherosclerosis, in  addition to left main and 2 vessel coronary artery disease. Assessment for potential risk factor modification, dietary therapy or pharmacologic therapy may be warranted, if clinically indicated. 4. Hepatic steatosis. 5. Small right adrenal adenoma incidentally noted. 6. Additional incidental findings, as above. Electronically Signed   By: Trudie Reed M.D.   On: 12/10/2022 06:37   DG Abd 1 View  Result Date: 12/10/2022 CLINICAL DATA:  Abdominal pain EXAM: ABDOMEN - 1 VIEW COMPARISON:  12/18/2020 abdominal CT FINDINGS: The upper abdomen is not included in the field of view. No obstructive pattern of bowel gas. Coarse calcification the pelvis from fibroid. Moderate stool seen in the right abdomen. Cholecystectomy clips. Bilateral hip replacement. IMPRESSION: No indication of bowel obstruction.  Moderate stool. Electronically Signed   By: Tiburcio Pea M.D.   On: 12/10/2022 04:16    Cardiac Studies   Echocardiogram 12/06/22 1. Left ventricular ejection fraction, by estimation, is 70 to 75%. The  left ventricle has hyperdynamic function. The left ventricle has no  regional wall motion abnormalities. There is moderate left ventricular  hypertrophy. Left ventricular diastolic  parameters are indeterminate.   2. Resting peak LVOT gradient 84 mmHg   3. Right ventricular systolic function is normal. The right ventricular  size is normal. Tricuspid regurgitation signal is inadequate for assessing  PA pressure.   4. Left atrial size was mildly dilated.   5. A small pericardial effusion is present.   6. The mitral valve is degenerative. Trivial mitral valve regurgitation.  Severe mitral stenosis. The mean mitral valve gradient is 16.0 mmHg with  average heart rate of 82 bpm. Severe mitral annular calcification.   7. The aortic valve has been repaired/replaced. Aortic valve  regurgitation is not visualized. There is a 23 mm Edwards Sapien  prosthetic (TAVR) valve present in the aortic position.  Procedure Date:  05/14/22. Vmax 3.2 m/s, MG 20 mmHg, EOA 2.2 cm^2, DI  0.58   Patient Profile     82 y.o. 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 Cardiology is following, planned for L and R heart cath 12/10/22, however, CTAP showed rectus sheath hematoma.   Assessment & Plan    NSTEMI  CAD s/p stenting of D1 and mLCx  - Patient presented with acute hypoxic respiratory failure secondary to RSV and COPD exacerbation  - hstn 24>747>1360>1200 - Most recent cardiac catheterization in 07/2020 (pre-TAVR) showed  patent stents, moderate pulmonary hypertension with mean PA pressure 38 mmHg, mean wedge pressure 27 mmHg, PVR 1.6 Wood units   - Echo this admission with EF 70-75%, no regional wall motion abnormalities, moderate LVH, severe MS (mean gradient 16.0), s/p TAVR  - Initially planned R/L heart catheterization, however patient complained of ongoing abdominal pain, and CTAP 1/8 showed a large intramuscular hemorrhage in the right rectus abdominus. Heparin was stopped. R/L heart canceled  - Continue metoprolol succinate 50 mg BID, crestor 20 mg daily, zetia 10 mg daily  - Eliquis and heparin held. ASA OK per Dr. Eden Emms   Severe MS  - Echo this admission with severe MS, mean gradient 16 mmHg  - Patient was scheduled for a R/L heart cath, but was found to have a large rectus sheath hematoma.  - Will need RHC once patient is stable. Likely can be done as an outpatient   Paroxysmal Atrial Fibrillation  - Per telemetry, patient is maintaining NSR  - Continue metoprolol succinate 50 mg BID  - As above, anticoagulation held due to rectus sheath hematoma. Note, patient has history of GI bleed on eliquis. Should  have follow up with Dr. Lalla Brothers to consider watchman  - Plan to hold eliquis for 2 weeks. May need CT abdomen in 2 weeks prior to resuming eliquis  - Maintain K>4, mag>2   S/P TAVR 2021 - Echo this admission with functional valve   Chronic diastolic heart failure  - Patient euvolemic on exam - Continue metoprolol. Consider MRA/SGLT2i when stable   Otherwise per primary  - RSV infection  - COPD exacerbation -- does have wheezing on exam, but has normal WOB on room air  - Pre-diabetes  - GERD - OSA  - HLD - Morbid obesity   For questions or updates, please contact [facility] Please consult [email or web address] for contact info under        Signed, Jonita Albee, PA-C  12/11/2022, 8:35 AM     Patient examined chart reviewed Abdominal wall less tender Hct has dropped about 9 points in last two days No need for transfusion currently Only on ASA 81 mg given coronary stents to LCX and D1.  Hold eliquis 2 weeks and repeat abdominal CT follow Hct She is maintaining NSR so risk of stroke less off DOAC.  Outpatient f/u with Dr Lalla Brothers to discuss Watchman   Charlton Haws MD [facility]

## 2022-12-11 NOTE — Progress Notes (Signed)
PT Cancellation Note  Patient Details Name: Victoria Delgado MRN: [id number] DOB: 1941/02/02   Cancelled Treatment:    Reason Eval/Treat Not Completed: Other (comment) Pt politely declining after working with OT and mobility.  Wyona Almas, PT, DPT [facility] [place] 12/11/2022, 2:31 PM

## 2022-12-11 NOTE — Progress Notes (Signed)
PROGRESS NOTE MALAY FANTROY  PZW:[id number] DOB: 1940/12/09 DOA: 12/05/2022 PCP: Charlane Ferretti, DO   Brief Narrative/Hospital Course: 82 y.o. female with PMH significant for obesity, OSA on CPAP, HTN, HLD, CAD/stents, AS s/p TAVR, paroxysmal A-fib on Eliquis, GI bleed, GERD presented to the ED 12/05/22 with complaint of cough, shortness of breath for 1 week which progressed significantly in the last 3 days. EMS noted O2 sat low at 89% on room air.  She was wheezing, given bronchodilators, started on supplemental oxygen and brought to the ED.She also had low-grade fever, dysuria, N/V/D.  There was possible concern of UTI. In the ED, patient was afebrile, heart rate 113, breathing on 2 L oxygen by nasal cannula. While in the ED, she had an episode of A-fib with RVR to 198, she was started on amiodarone drip and subsequently converted to normal sinus rhythm. Respiratory panel positive for RSV, UMP:[id number] thickening may be bronchitic or congestive.  Patient admitted for acute COPD exacerbation with RSV bronchitis A-fib with RVR and also elevated troponin   Subjective: Seen and examined this morning. She feels much improved pain is well-controlled Overnight Bump in wbc counts but no fever. Hemoglobin at 9.5 g dropped from 11.1.   Assessment and Plan: Principal Problem:   SVT (supraventricular tachycardia)  Acute COPD exacerbation due to RSV bronchitis Acute respiratory failure with hypoxia: Patient  with worsening of respiratory status with COPD exacerbation in the setting of RSV infection.CXR with bronchitis changes. Continue empiric doxycycline,IS,aggressive bronchodilators,along with steroid taper per pulmonary, continue supplemental oxygen and wean as tolerated.  Encourage ambulation  Elevated troponin: In the setting of A-fib with RVR likely type II demand ischemia but per cardiology needs LHC but now on hold per cardio likely will be done outpatient.  Cardiology okay with aspirin and  resumed.  Continue Zetia and statin.Heparin stopped due to hematoma. Severe MS: Cardiology initially planned for RHC at the time of LHC to assess further but now on hold.  Cardiac cath likely will be done as outpatient.  PAF with RVR: Continue metoprolol. her rate is controlled.Low threshold to add back amiodarone if AF recurs. Heparin held due to rectus sheath hematoma  Abdominal pain with large intramuscular hemorrhage in the right rectus abdominis 12.2x7.2x19.9. Acute blood loss anemia due to intestinal hematoma drop in hemoglobin noted monitor and transfuse if less than 7 g. Heparin has been held 1/7 NIGHT> Monitor H&H.pain overall much improved, continue as needed morphine.   Recent Labs  Lab 12/07/22 0625 12/08/22 0143 12/09/22 0113 12/10/22 0111 12/11/22 0137  HGB 12.8 12.4 12.1 11.1* 9.5*  HCT 39.3 38.3 39.5 34.9* 30.4*   HLD continue statin and Zetia Morbid  Obesity:Patient's Body mass index is 42.57 kg/m. : Will benefit with PCP follow-up, weight loss  healthy lifestyle and outpatient sleep evaluation.  DVT prophylaxis: Place and maintain sequential compression device Start: 12/10/22 1333 Place and maintain sequential compression device Start: 12/10/22 0947SCD added Code Status:   Code Status: Full Code Family Communication: plan of care discussed with patient/husband at bedside. Patient status is: inpatient  because of abdomen pain Level of care: Progressive   Dispo: The patient is from: [place]/ husband            Anticipated disposition: Encourage PT OT   Objective: Vitals last 24 hrs: Vitals:   12/10/22 2121 12/10/22 2344 12/11/22 0024 12/11/22 0344  BP:  102/61  116/66  Pulse:  74 75 76  Resp:  20 (!) 21 20  Temp:  98.3 F (36.8 C)  97.7 F (36.5 C)  TempSrc:  Axillary  Axillary  SpO2: 98% 92% 90% 92%  Weight:   126.1 kg 127 kg  Height:       Weight change: 2.1 kg  Physical Examination: General exam: AAox3, weak,older appearing HEENT:Oral mucosa moist,  Ear/Nose WNL grossly, dentition normal. Respiratory system: bilaterally clear BS,no use of accessory muscle Cardiovascular system: S1 & S2 +, regular rate. Gastrointestinal system: Abdomen soft, slightly tender on the right abdomen,ND,BS+ Nervous System:Alert, awake, moving extremities and grossly nonfocal Extremities: LE ankle edema neg, lower extremities warm Skin: No rashes,no icterus. MSK: Normal muscle bulk,tone, power   Medications reviewed:  Scheduled Meds:  aspirin EC  81 mg Oral Daily   benzonatate  100 mg Oral TID   cholecalciferol  1,000 Units Oral BID   docusate sodium  100 mg Oral Daily   ezetimibe  10 mg Oral Daily   famotidine  40 mg Oral Daily   insulin aspart  0-5 Units Subcutaneous QHS   insulin aspart  0-9 Units Subcutaneous TID WC   levalbuterol  0.63 mg Nebulization QID   metoprolol succinate  50 mg Oral BID   multivitamin with minerals  1 tablet Oral Once per day on Mon Thu   nystatin   Topical BID   [START ON 12/12/2022] predniSONE  40 mg Oral Q breakfast   Followed by   Melene Muller ON 12/15/2022] predniSONE  30 mg Oral Q breakfast   Followed by   Melene Muller ON 12/18/2022] predniSONE  20 mg Oral Q breakfast   Followed by   Melene Muller ON 12/21/2022] predniSONE  10 mg Oral Q breakfast   rosuvastatin  20 mg Oral Daily   umeclidinium bromide  1 puff Inhalation Daily   Continuous Infusions:      Diet Order             Diet Heart Room service appropriate? Yes; Fluid consistency: Thin  Diet effective now                  Intake/Output Summary (Last 24 hours) at 12/11/2022 0731 Last data filed at 12/11/2022 5916 Gross per 24 hour  Intake 600 ml  Output 550 ml  Net 50 ml    Net IO Since Admission: -293.53 mL [12/11/22 0731]  Wt Readings from Last 3 Encounters:  12/11/22 127 kg  11/09/22 126.1 kg  05/15/22 126.4 kg     Unresulted Labs (From admission, onward)     Start     Ordered   12/11/22 0500  Basic metabolic panel  Daily,   R     Question:  Specimen  collection method  Answer:  Lab=Lab collect   12/10/22 1127   12/07/22 0500  CBC  Daily,   R     See Hyperspace for full Linked Orders Report.   12/06/22 3846           Data Reviewed: I have personally reviewed following labs and imaging studies CBC: Recent Labs  Lab 12/05/22 2234 12/05/22 2240 12/06/22 0810 12/07/22 0625 12/08/22 0143 12/09/22 0113 12/10/22 0111 12/11/22 0137  WBC 9.4  --  7.1 15.7* 14.0* 12.4* 12.8* 18.5*  NEUTROABS 6.1  --  6.8  --   --   --   --   --   HGB 13.9   < > 12.7 12.8 12.4 12.1 11.1* 9.5*  HCT 42.9   < > 39.3 39.3 38.3 39.5 34.9* 30.4*  MCV 87.9  --  88.1  88.1 87.8 91.6 88.1 89.1  PLT 209  --  188 208 218 183 240 285   < > = values in this interval not displayed.    Basic Metabolic Panel: Recent Labs  Lab 12/05/22 2234 12/05/22 2240 12/06/22 0810 12/07/22 0625 12/09/22 0637 12/10/22 0111 12/11/22 0137  NA 132*   < > 135 137 140 134* 133*  K 3.2*   < > 3.5 3.6 3.9 4.0 4.0  CL 99   < > 101 102 101 100 97*  CO2 22  --  22 27 28 28 27   GLUCOSE 183*   < > 270* 211* 211* 271* 194*  BUN 14   < > 11 11 20 23  35*  CREATININE 1.06*   < > 0.98 0.81 0.72 0.83 0.96  CALCIUM 8.5*  --  8.3* 8.5* 8.2* 8.3* 8.0*  MG 2.1  --  2.0  --   --   --   --   PHOS  --   --  3.4  --   --   --   --    < > = values in this interval not displayed.    GFR: Estimated Creatinine Clearance: 66.8 mL/min (by C-G formula based on SCr of 0.96 mg/dL). Liver Function Tests: Recent Labs  Lab 12/05/22 2234 12/06/22 0810 12/07/22 0625  AST 35 46* 40  ALT 20 23 21   ALKPHOS 76 67 63  BILITOT 0.9 0.8 0.9  PROT 6.8 6.2* 6.5  ALBUMIN 3.7 3.4* 3.3*    No results for input(s): "HGBA1C" in the last 72 hours.  CBG: Recent Labs  Lab 12/10/22 0643 12/10/22 1305 12/10/22 1716 12/10/22 2053 12/11/22 0611  GLUCAP 228* 210* 200* 201* 223*    Recent Results (from the past 240 hour(s))  Resp panel by RT-PCR (RSV, Flu A&B, Covid) Anterior Nasal Swab     Status:  Abnormal   Collection Time: 12/05/22 10:08 PM   Specimen: Anterior Nasal Swab  Result Value Ref Range Status   SARS Coronavirus 2 by RT PCR NEGATIVE NEGATIVE Final    Comment: (NOTE) SARS-CoV-2 target nucleic acids are NOT DETECTED.  The SARS-CoV-2 RNA is generally detectable in upper respiratory specimens during the acute phase of infection. The lowest concentration of SARS-CoV-2 viral copies this assay can detect is 138 copies/mL. A negative result does not preclude SARS-Cov-2 infection and should not be used as the sole basis for treatment or other patient management decisions. A negative result may occur with  improper specimen collection/handling, submission of specimen other than nasopharyngeal swab, presence of viral mutation(s) within the areas targeted by this assay, and inadequate number of viral copies(<138 copies/mL). A negative result must be combined with clinical observations, patient history, and epidemiological information. The expected result is Negative.  Fact Sheet for Patients:  EntrepreneurPulse.com.au  Fact Sheet for Healthcare Providers:  IncredibleEmployment.be  This test is no t yet approved or cleared by the [place] FDA and  has been authorized for detection and/or diagnosis of SARS-CoV-2 by FDA under an Emergency Use Authorization (EUA). This EUA will remain  in effect (meaning this test can be used) for the duration of the COVID-19 declaration under Section 564(b)(1) of the Act, 21 U.S.C.section 360bbb-3(b)(1), unless the authorization is terminated  or revoked sooner.       Influenza A by PCR NEGATIVE NEGATIVE Final   Influenza B by PCR NEGATIVE NEGATIVE Final    Comment: (NOTE) The Xpert Xpress SARS-CoV-2/FLU/RSV plus assay is intended as an aid in  the diagnosis of influenza from Nasopharyngeal swab specimens and should not be used as a sole basis for treatment. Nasal washings and aspirates are  unacceptable for Xpert Xpress SARS-CoV-2/FLU/RSV testing.  Fact Sheet for Patients: BloggerCourse.com  Fact Sheet for Healthcare Providers: SeriousBroker.it  This test is not yet approved or cleared by the [place] FDA and has been authorized for detection and/or diagnosis of SARS-CoV-2 by FDA under an Emergency Use Authorization (EUA). This EUA will remain in effect (meaning this test can be used) for the duration of the COVID-19 declaration under Section 564(b)(1) of the Act, 21 U.S.C. section 360bbb-3(b)(1), unless the authorization is terminated or revoked.     Resp Syncytial Virus by PCR POSITIVE (A) NEGATIVE Final    Comment: (NOTE) Fact Sheet for Patients: BloggerCourse.com  Fact Sheet for Healthcare Providers: SeriousBroker.it  This test is not yet approved or cleared by the [place] FDA and has been authorized for detection and/or diagnosis of SARS-CoV-2 by FDA under an Emergency Use Authorization (EUA). This EUA will remain in effect (meaning this test can be used) for the duration of the COVID-19 declaration under Section 564(b)(1) of the Act, 21 U.S.C. section 360bbb-3(b)(1), unless the authorization is terminated or revoked.  Performed at [facility], [address] [place], [place] [address]   Respiratory (~20 pathogens) panel by PCR     Status: Abnormal   Collection Time: 12/05/22 10:08 PM   Specimen: Anterior Nasal Swab; Respiratory  Result Value Ref Range Status   Adenovirus NOT DETECTED NOT DETECTED Final   Coronavirus 229E NOT DETECTED NOT DETECTED Final    Comment: (NOTE) The Coronavirus on the Respiratory Panel, DOES NOT test for the novel  Coronavirus (2019 nCoV)    Coronavirus HKU1 NOT DETECTED NOT DETECTED Final   Coronavirus NL63 NOT DETECTED NOT DETECTED Final   Coronavirus OC43 NOT DETECTED NOT DETECTED Final   Metapneumovirus  NOT DETECTED NOT DETECTED Final   Rhinovirus / Enterovirus NOT DETECTED NOT DETECTED Final   Influenza A NOT DETECTED NOT DETECTED Final   Influenza B NOT DETECTED NOT DETECTED Final   Parainfluenza Virus 1 NOT DETECTED NOT DETECTED Final   Parainfluenza Virus 2 NOT DETECTED NOT DETECTED Final   Parainfluenza Virus 3 NOT DETECTED NOT DETECTED Final   Parainfluenza Virus 4 NOT DETECTED NOT DETECTED Final   Respiratory Syncytial Virus DETECTED (A) NOT DETECTED Final   Bordetella pertussis NOT DETECTED NOT DETECTED Final   Bordetella Parapertussis NOT DETECTED NOT DETECTED Final   Chlamydophila pneumoniae NOT DETECTED NOT DETECTED Final   Mycoplasma pneumoniae NOT DETECTED NOT DETECTED Final    Comment: Performed at [facility], [address], [place], [place] [address]  Urine Culture     Status: Abnormal   Collection Time: 12/06/22  1:09 AM   Specimen: Urine, Clean Catch  Result Value Ref Range Status   Specimen Description URINE, CLEAN CATCH  Final   Special Requests   Final    NONE Performed at [facility], [address], [place], [place] [address]    Culture MULTIPLE SPECIES PRESENT, SUGGEST RECOLLECTION (A)  Final   Report Status 12/07/2022 FINAL  Final  Expectorated Sputum Assessment w Gram Stain, Rflx to Resp Cult     Status: None (Preliminary result)   Collection Time: 12/06/22  5:35 AM   Specimen: Expectorated Sputum  Result Value Ref Range Status   Specimen Description EXPECTORATED SPUTUM  Final   Special Requests NONE  Final  Sputum evaluation   Final    Sputum specimen not acceptable for testing.  Please recollect.   NOTIFIED RN REDEN ON 12/06/22 @ 2247 BY DRT  PREVIOUSLY REPORTED AS: FEW WBC PRESENT,BOTH PMN AND MONONUCLEAR FEW GRAM POSITIVE COCCI FEW GRAM NEGATIVE RODS FEW GRAM POSITIVE RODS Performed at [facility], [address] [place], [place] [address]    Report Status PENDING  Incomplete    Antimicrobials: Anti-infectives (From  admission, onward)    Start     Dose/Rate Route Frequency Ordered Stop   12/06/22 1000  doxycycline (VIBRA-TABS) tablet 100 mg        100 mg Oral Every 12 hours 12/06/22 0535 12/10/22 2152      Culture/Microbiology    Component Value Date/Time   SDES EXPECTORATED SPUTUM 12/06/2022 0535   SPECREQUEST NONE 12/06/2022 0535   CULT MULTIPLE SPECIES PRESENT, SUGGEST RECOLLECTION (A) 12/06/2022 0109   REPTSTATUS PENDING 12/06/2022 0535  Other culture-see note  Radiology Studies: CT ABDOMEN PELVIS W CONTRAST  Result Date: 12/10/2022 CLINICAL DATA:  82 year old female with history of right lower quadrant abdominal pain. Evaluate for abdominal wall hematoma. EXAM: CT ABDOMEN AND PELVIS WITH CONTRAST TECHNIQUE: Multidetector CT imaging of the abdomen and pelvis was performed using the standard protocol following bolus administration of intravenous contrast. RADIATION DOSE REDUCTION: This exam was performed according to the departmental dose-optimization program which includes automated exposure control, adjustment of the mA and/or kV according to patient size and/or use of iterative reconstruction technique. CONTRAST:  47mL OMNIPAQUE IOHEXOL 350 MG/ML SOLN COMPARISON:  CT of the abdomen and pelvis 12/18/2020. FINDINGS: Lower chest: Scattered areas of cylindrical bronchiectasis, thickening of the peribronchovascular interstitium and patchy peribronchovascular ground-glass attenuation noted throughout the visualized lung bases, suggesting underlying interstitial lung disease. There is also a background of mild centrilobular and paraseptal emphysema. Atherosclerotic calcifications in the descending thoracic aorta as well as the left main, left anterior descending and left circumflex coronary arteries. Severe calcifications of the mitral annulus. Status post TAVR. Hepatobiliary: Diffuse low attenuation throughout the hepatic parenchyma, indicative of a background of hepatic steatosis. No definite suspicious  cystic or solid hepatic lesions are confidently identified. Status post cholecystectomy. Pancreas: No pancreatic mass. No pancreatic ductal dilatation. No pancreatic or peripancreatic fluid collections or inflammatory changes. Spleen: Unremarkable. Adrenals/Urinary Tract: 2.3 cm low-attenuation lesion in the interpolar region of the left kidney is compatible with a simple (Bosniak class 1, no imaging follow-up recommended) cysts. Right kidney and left adrenal gland are normal in appearance. 2.2 cm right adrenal nodule, similar to prior studies, previously characterized as a benign adrenal adenoma (no imaging follow-up recommended). No hydroureteronephrosis. Urinary bladder is largely obscured by beam hardening artifact from the patient's bilateral hip arthroplasties. Stomach/Bowel: The appearance of the stomach is normal. There is no pathologic dilatation of small bowel or colon. Status post appendectomy. Vascular/Lymphatic: Atherosclerosis throughout the abdominal aorta and pelvic vasculature. No lymphadenopathy noted in the abdomen or pelvis. Reproductive: Uterus is atrophic with multiple densely calcified lesions, largest of which is in the fundal region measuring 2.6 cm in diameter, likely fibroids. Ovaries are atrophic. Other: No significant volume of ascites.  No pneumoperitoneum. Musculoskeletal: Throughout the right rectus abdominus musculature there is heterogeneous attenuation with multiple fluid fluid levels, likely to represent intramuscular hemorrhage, estimated to measure approximately 12.2 x 7.2 x 19.9 cm (axial image 69 of series 2 and sagittal image 63 of series 7). Status post bilateral hip arthroplasties. There are no aggressive appearing lytic or blastic lesions  noted in the visualized portions of the skeleton. IMPRESSION: 1. Large intramuscular hemorrhage in the right rectus abdominus musculature, as above, estimated to measure approximately 12.2 x 7.2 x 19.9 cm. 2. The appearance of the lung  bases suggests either interstitial lung disease or chronic atypical infection. Outpatient referral to Pulmonology for further clinical evaluation and follow-up nonemergent outpatient high-resolution chest CT are suggested in the near future. 3. Aortic atherosclerosis, in addition to left main and 2 vessel coronary artery disease. Assessment for potential risk factor modification, dietary therapy or pharmacologic therapy may be warranted, if clinically indicated. 4. Hepatic steatosis. 5. Small right adrenal adenoma incidentally noted. 6. Additional incidental findings, as above. Electronically Signed   By: Trudie Reed M.D.   On: 12/10/2022 06:37   DG Abd 1 View  Result Date: 12/10/2022 CLINICAL DATA:  Abdominal pain EXAM: ABDOMEN - 1 VIEW COMPARISON:  12/18/2020 abdominal CT FINDINGS: The upper abdomen is not included in the field of view. No obstructive pattern of bowel gas. Coarse calcification the pelvis from fibroid. Moderate stool seen in the right abdomen. Cholecystectomy clips. Bilateral hip replacement. IMPRESSION: No indication of bowel obstruction.  Moderate stool. Electronically Signed   By: Tiburcio Pea M.D.   On: 12/10/2022 04:16     LOS: 5 days   Lanae Boast, MD [facility]  12/11/2022, 7:31 AM

## 2022-12-11 NOTE — Progress Notes (Addendum)
Mobility Specialist Progress Note:   12/11/22 1200  Mobility  Activity Ambulated with assistance in room;Transferred from bed to chair  Level of Assistance Standby assist, set-up cues, supervision of patient - no hands on  Assistive Device None  Distance Ambulated (ft) 4 ft  Activity Response Tolerated well  $Mobility charge 1 Mobility   Pt in bed willing to participate in mobility. No complaints of pain. Left in chair with call bell in reach and all needs met.   [name] Mobility Specialist Please contact via [employer] or  Rehab Office at [phone]

## 2022-12-11 NOTE — Evaluation (Signed)
Occupational Therapy Evaluation Patient Details Name: Victoria Delgado MRN: [id number] DOB: 08/09/1941 Today's Date: 12/11/2022   History of Present Illness Pt is a 82 y.o. female admitted 12/05/22 with acute respiratory failure, RSV infection. Course complicated by NSTEMI, afib RVR. Planned L and R heart cath but delayed due to rectus sheath hematoma. PMH includes CAD, AS s/p TAVR, afib, OSA, emphysema, obesity.   Clinical Impression   PTA patient independent and driving. Admitted for above and limited by problem list below. She completes transfers with min guard using RW, functional mobility in room using RW with min guard and ADLs with setup to mod assist.  Limited by abdominal pain and decreased activity tolerance for ADL performance. RN notified pt reported symptoms with urination.  Based on performance today, believe she will progress well acutely.  Anticipate no further OT needs after dc home.      Recommendations for follow up therapy are one component of a multi-disciplinary discharge planning process, led by the attending physician.  Recommendations may be updated based on patient status, additional functional criteria and insurance authorization.   Follow Up Recommendations  No OT follow up     Assistance Recommended at Discharge PRN  Patient can return home with the following A little help with walking and/or transfers;A lot of help with bathing/dressing/bathroom;Assist for transportation;Help with stairs or ramp for entrance;Assistance with cooking/housework    Functional Status Assessment  Patient has had a recent decline in their functional status and demonstrates the ability to make significant improvements in function in a reasonable and predictable amount of time.  Equipment Recommendations  BSC/3in1    Recommendations for Other Services       Precautions / Restrictions Precautions Precautions: Fall Restrictions Weight Bearing Restrictions: No      Mobility Bed  Mobility Overal bed mobility: Modified Independent             General bed mobility comments: HOB elevated, no physical assist required    Transfers Overall transfer level: Needs assistance Equipment used: Rolling walker (2 wheels) Transfers: Sit to/from Stand Sit to Stand: Min guard           General transfer comment: for safety      Balance Overall balance assessment: Needs assistance Sitting-balance support: No upper extremity supported, Feet supported Sitting balance-Leahy Scale: Good     Standing balance support: No upper extremity supported, During functional activity, Bilateral upper extremity supported Standing balance-Leahy Scale: Fair                             ADL either performed or assessed with clinical judgement   ADL Overall ADL's : Needs assistance/impaired     Grooming: Set up;Sitting           Upper Body Dressing : Set up;Sitting   Lower Body Dressing: Moderate assistance;Sit to/from stand Lower Body Dressing Details (indicate cue type and reason): assist for socks Toilet Transfer: Min guard;Ambulation;Rolling walker (2 wheels);BSC/3in1   Toileting- Clothing Manipulation and Hygiene: Min guard;Sit to/from stand       Functional mobility during ADLs: Min guard;Rolling walker (2 wheels)       Vision   Vision Assessment?: No apparent visual deficits     Perception     Praxis      Pertinent Vitals/Pain Pain Assessment Pain Assessment: Faces Faces Pain Scale: Hurts a little bit Pain Location: abodmen Pain Descriptors / Indicators: Discomfort Pain Intervention(s): Monitored during session, Repositioned, Limited  activity within patient's tolerance     Hand Dominance     Extremity/Trunk Assessment Upper Extremity Assessment Upper Extremity Assessment: Generalized weakness   Lower Extremity Assessment Lower Extremity Assessment: Defer to PT evaluation       Communication Communication Communication: No  difficulties   Cognition Arousal/Alertness: Awake/alert Behavior During Therapy: WFL for tasks assessed/performed Overall Cognitive Status: Within Functional Limits for tasks assessed                                       General Comments       Exercises     Shoulder Instructions      Home Living Family/patient expects to be discharged to:: Private residence Living Arrangements: Spouse/significant other Available Help at Discharge: Family;Available 24 hours/day Type of Home: House Home Access: Stairs to enter [employer] of Steps: 3-4 Entrance Stairs-Rails: Right Home Layout: One level     Bathroom Shower/Tub: Walk-in shower;Tub/shower unit   Bathroom Toilet: Handicapped height     Home Equipment: Agricultural consultant (2 wheels);Cane - single point;Grab bars - tub/shower;Other (comment) (scooter)   Additional Comments: pt and husband retired from work      Prior Functioning/Environment Prior Level of Function : Independent/Modified Independent;Driving             Mobility Comments: independent without DME ADLs Comments: IADLs, driving        OT Problem List: Decreased strength;Decreased activity tolerance;Impaired balance (sitting and/or standing);Pain;Decreased knowledge of precautions;Decreased knowledge of use of DME or AE      OT Treatment/Interventions: Self-care/ADL training;Therapeutic exercise;DME and/or AE instruction;Energy conservation;Therapeutic activities;Balance training;Patient/family education    OT Goals(Current goals can be found in the care plan section) Acute Rehab OT Goals Patient Stated Goal: home OT Goal Formulation: With patient Time For Goal Achievement: 12/25/22 Potential to Achieve Goals: Good  OT Frequency: Min 2X/week    Co-evaluation              AM-PAC OT "6 Clicks" Daily Activity     Outcome Measure Help from another person eating meals?: None Help from another person taking care of personal  grooming?: A Little Help from another person toileting, which includes using toliet, bedpan, or urinal?: A Little Help from another person bathing (including washing, rinsing, drying)?: A Lot Help from another person to put on and taking off regular upper body clothing?: A Little Help from another person to put on and taking off regular lower body clothing?: A Lot 6 Click Score: 17   End of Session Equipment Utilized During Treatment: Rolling walker (2 wheels) Nurse Communication: Mobility status  Activity Tolerance: Patient tolerated treatment well Patient left: with call bell/phone within reach;in bed;with SCD's reapplied  OT Visit Diagnosis: Other abnormalities of gait and mobility (R26.89);Pain Pain - part of body:  (abd)                Time: 7026-3785 OT Time Calculation (min): 21 min Charges:  OT General Charges $OT Visit: 1 Visit OT Evaluation $OT Eval Moderate Complexity: 1 Mod  Barry Brunner, OT Acute Rehabilitation Services Office [phone]   Chancy Milroy 12/11/2022, 10:58 AM

## 2022-12-12 DIAGNOSIS — I471 Supraventricular tachycardia, unspecified: Secondary | ICD-10-CM | POA: Diagnosis not present

## 2022-12-12 DIAGNOSIS — Z7901 Long term (current) use of anticoagulants: Secondary | ICD-10-CM | POA: Diagnosis not present

## 2022-12-12 DIAGNOSIS — Z5181 Encounter for therapeutic drug level monitoring: Secondary | ICD-10-CM | POA: Diagnosis not present

## 2022-12-12 DIAGNOSIS — I480 Paroxysmal atrial fibrillation: Secondary | ICD-10-CM | POA: Diagnosis not present

## 2022-12-12 LAB — BASIC METABOLIC PANEL
Anion gap: 7 (ref 5–15)
BUN: 26 mg/dL — ABNORMAL HIGH (ref 8–23)
CO2: 28 mmol/L (ref 22–32)
Calcium: 8.2 mg/dL — ABNORMAL LOW (ref 8.9–10.3)
Chloride: 98 mmol/L (ref 98–111)
Creatinine, Ser: 0.89 mg/dL (ref 0.44–1.00)
GFR, Estimated: >60 mL/min (ref >60)
Glucose, Bld: 191 mg/dL — ABNORMAL HIGH (ref 70–99)
Potassium: 4.1 mmol/L (ref 3.5–5.1)
Sodium: 133 mmol/L — ABNORMAL LOW (ref 135–145)

## 2022-12-12 LAB — GLUCOSE, CAPILLARY
Glucose-Capillary: 182 mg/dL — ABNORMAL HIGH (ref 70–99)
Glucose-Capillary: 180 mg/dL — ABNORMAL HIGH (ref 70–99)
Glucose-Capillary: 263 mg/dL — ABNORMAL HIGH (ref 70–99)
Glucose-Capillary: 196 mg/dL — ABNORMAL HIGH (ref 70–99)

## 2022-12-12 LAB — CBC
HCT: 27.1 % — ABNORMAL LOW (ref 36.0–46.0)
Hemoglobin: 8.9 g/dL — ABNORMAL LOW (ref 12.0–15.0)
MCH: 28.7 pg (ref 26.0–34.0)
MCHC: 32.8 g/dL (ref 30.0–36.0)
MCV: 87.4 fL (ref 80.0–100.0)
Platelets: 208 10*3/uL (ref 150–400)
RBC: 3.1 MIL/uL — ABNORMAL LOW (ref 3.87–5.11)
RDW: 17.2 % — ABNORMAL HIGH (ref 11.5–15.5)
WBC: 15.2 10*3/uL — ABNORMAL HIGH (ref 4.0–10.5)
nRBC: 1.4 % — ABNORMAL HIGH (ref 0.0–0.2)

## 2022-12-12 MED ORDER — CEPHALEXIN 500 MG PO CAPS
500.0000 mg | ORAL_CAPSULE | Freq: Three times a day (TID) | ORAL | Status: DC
  Administered 2022-12-12 – 2022-12-13 (×4): 500 mg via ORAL
  Filled 2022-12-12 (×4): qty 1

## 2022-12-12 NOTE — Progress Notes (Signed)
PROGRESS NOTE Victoria Delgado  KLK:[id number] DOB: Feb 13, 1941 DOA: 12/05/2022 PCP: Charlane Ferretti, DO   Brief Narrative/Hospital Course: 82 y.o. female with PMH significant for obesity, OSA on CPAP, HTN, HLD, CAD/stents, AS s/p TAVR, paroxysmal A-fib on Eliquis, GI bleed, GERD presented to the ED 12/05/22 with complaint of cough, shortness of breath for 1 week which progressed significantly in the last 3 days. EMS noted O2 sat low at 89% on room air.  She was wheezing, given bronchodilators, started on supplemental oxygen and brought to the ED.She also had low-grade fever, dysuria, N/V/D.  There was possible concern of UTI. In the ED, patient was afebrile, heart rate 113, breathing on 2 L oxygen by nasal cannula. While in the ED, she had an episode of A-fib with RVR to 198, she was started on amiodarone drip and subsequently converted to normal sinus rhythm. Respiratory panel positive for RSV, PVX:[id number] thickening may be bronchitic or congestive.  Patient admitted for acute COPD exacerbation with RSV bronchitis A-fib with RVR and also elevated troponin   Subjective: Seen this am C/o congested chest Some abdominal pain but overall better Overnight patient has been afebrile.   Labs reviewed> WBC down to 15.2 from 18.5 hemoglobin 8.9 Yesterday having dysuria STD UA showed WBC 11-20 leukocytes negative urine culture ordered.    Assessment and Plan: Principal Problem:   SVT (supraventricular tachycardia)  Acute COPD exacerbation due to RSV bronchitis Acute respiratory failure with hypoxia: Patient  with worsening of respiratory status with COPD exacerbation in the setting of RSV infection.CXR with bronchitis changes.  Overall clinically improving> still wheezing chest congestion, but improving. continue on antibiotics,IS,aggressive bronchodilators,along with steroid taper ordered per pulmonary, continue supplemental oxygen and wean as tolerated.  Encourage ambulation PTOT.  Elevated  troponin:In the setting of A-fib with RVR likely type II demand ischemia but per cardiology needs LHC but now on hold per cardio likely will be done outpatient> but patient wants to pursue at [facility] and she has made an appointment.Cardiology okay with aspirin and resumed.  Continue Zetia and statin.Heparin stopped due to hematoma. Severe MS: Cardiology initially planned for [facility] at the time of LHC to assess further but now on hold.  Cardiac cath likely will be done as outpatient at [facility]  PAF with RVR:HR is controlled on metoprolol. Low threshold to add back amiodarone if AF recurs. Heparin held due to rectus sheath hematoma> anticoagulation to be held for 2 weeks then rescan of the bed prior to resuming her home anticoagulation, this will be pursued by her outpatient cardiology/[facility], patient verbalized understanding.  Abdominal pain with large intramuscular hemorrhage in the right rectus abdominis 12.2x7.2x19.9. Acute blood loss anemia due to intestinal hematoma drop in hemoglobin noted monitor and transfuse if less than 7 g. Heparin has been held 1/7 NIGHT> Monitor H&H> and evaluation once okay with cardiology likely in a week or 2 as outpatient.Pain overall much improved, continue as needed morphine.   Recent Labs  Lab 12/08/22 0143 12/09/22 0113 12/10/22 0111 12/11/22 0137 12/12/22 0056  HGB 12.4 12.1 11.1* 9.5* 8.9*  HCT 38.3 39.5 34.9* 30.4* 27.1*   HLD continue statin and Zetia Morbid  Obesity:Patient's Body mass index is 41.77 kg/m.Will benefit with PCP follow-up,weight loss  healthy lifestyle and outpatient sleep evaluation. Dysuria: UA abnormal send urine culture empiric antibiotics given leukocytosis and dysuria   DVT prophylaxis: Place and maintain sequential compression device Start: 12/10/22 1333 Place and maintain sequential compression device Start: 12/10/22 0947SCD added Code Status:   Code  Status: Full Code Family Communication: plan of care discussed with patient/husband at  bedside. Patient status is: inpatient  because of abdomen pain Level of care: Progressive   Dispo: The patient is from: home w/ husband            Anticipated disposition: Encourage PT OT   Objective: Vitals last 24 hrs: Vitals:   12/12/22 0018 12/12/22 0300 12/12/22 0449 12/12/22 0711  BP:  121/60  (!) 119/59  Pulse:  69  71  Resp:  20  18  Temp:  97.8 F (36.6 C)  98.2 F (36.8 C)  TempSrc:  Oral  Oral  SpO2: 94% 95%  94%  Weight:   124.6 kg   Height:       Weight change: -1.497 kg  Physical Examination: General exam: AAox3, weak,older appearing HEENT:Oral mucosa moist, Ear/Nose WNL grossly, dentition normal. Respiratory system: bilaterally clear with mild wheezing,no use of accessory muscle Cardiovascular system: S1 & S2 +, regular rate. Gastrointestinal system: Abdomen soft, obese, tender on rt abdomen,ND,BS+ Nervous System:Alert, awake, moving extremities and grossly nonfocal Extremities: LE ankle edema neg, lower extremities warm Skin: No rashes,no icterus. MSK: Normal muscle bulk,tone, power    Medications reviewed:  Scheduled Meds:  aspirin EC  81 mg Oral Daily   benzonatate  100 mg Oral TID   cholecalciferol  1,000 Units Oral BID   docusate sodium  100 mg Oral Daily   ezetimibe  10 mg Oral Daily   famotidine  40 mg Oral Daily   insulin aspart  0-5 Units Subcutaneous QHS   insulin aspart  0-9 Units Subcutaneous TID WC   levalbuterol  0.63 mg Nebulization TID   metoprolol succinate  50 mg Oral BID   multivitamin with minerals  1 tablet Oral Once per day on Mon Thu   predniSONE  40 mg Oral Q breakfast   Followed by   Derrill Memo ON 12/15/2022] predniSONE  30 mg Oral Q breakfast   Followed by   Derrill Memo ON 12/18/2022] predniSONE  20 mg Oral Q breakfast   Followed by   Derrill Memo ON 12/21/2022] predniSONE  10 mg Oral Q breakfast   rosuvastatin  20 mg Oral Daily   umeclidinium bromide  1 puff Inhalation Daily   Continuous Infusions:      Diet Order              Diet Heart Room service appropriate? Yes; Fluid consistency: Thin  Diet effective now                  Intake/Output Summary (Last 24 hours) at 12/12/2022 0714 Last data filed at 12/12/2022 0634 Gross per 24 hour  Intake 240 ml  Output 1900 ml  Net -1660 ml    Net IO Since Admission: -1,953.53 mL [12/12/22 0714]  Wt Readings from Last 3 Encounters:  12/12/22 124.6 kg  11/09/22 126.1 kg  05/15/22 126.4 kg     Unresulted Labs (From admission, onward)     Start     Ordered   12/11/22 2045  Urine Culture  (Urine Culture)  Add-on,   AD       Question:  Indication  Answer:  Dysuria   12/11/22 2044   12/11/22 1610  Basic metabolic panel  Daily,   R     Question:  Specimen collection method  Answer:  Lab=Lab collect   12/10/22 1127   12/07/22 0500  CBC  Daily,   R     See Hyperspace for full Linked  Orders Report.   12/06/22 4010           Data Reviewed: I have personally reviewed following labs and imaging studies CBC: Recent Labs  Lab 12/05/22 2234 12/05/22 2240 12/06/22 0810 12/07/22 0625 12/08/22 0143 12/09/22 0113 12/10/22 0111 12/11/22 0137 12/12/22 0056  WBC 9.4  --  7.1   < > 14.0* 12.4* 12.8* 18.5* 15.2*  NEUTROABS 6.1  --  6.8  --   --   --   --   --   --   HGB 13.9   < > 12.7   < > 12.4 12.1 11.1* 9.5* 8.9*  HCT 42.9   < > 39.3   < > 38.3 39.5 34.9* 30.4* 27.1*  MCV 87.9  --  88.1   < > 87.8 91.6 88.1 89.1 87.4  PLT 209  --  188   < > 218 183 240 285 208   < > = values in this interval not displayed.    Basic Metabolic Panel: Recent Labs  Lab 12/05/22 2234 12/05/22 2240 12/06/22 0810 12/07/22 0625 12/09/22 0637 12/10/22 0111 12/11/22 0137 12/12/22 0056  NA 132*   < > 135 137 140 134* 133* 133*  K 3.2*   < > 3.5 3.6 3.9 4.0 4.0 4.1  CL 99   < > 101 102 101 100 97* 98  CO2 22  --  22 27 28 28 27 28   GLUCOSE 183*   < > 270* 211* 211* 271* 194* 191*  BUN 14   < > 11 11 20 23  35* 26*  CREATININE 1.06*   < > 0.98 0.81 0.72 0.83 0.96 0.89   CALCIUM 8.5*  --  8.3* 8.5* 8.2* 8.3* 8.0* 8.2*  MG 2.1  --  2.0  --   --   --   --   --   PHOS  --   --  3.4  --   --   --   --   --    < > = values in this interval not displayed.    GFR: Estimated Creatinine Clearance: 71.4 mL/min (by C-G formula based on SCr of 0.89 mg/dL). Liver Function Tests: Recent Labs  Lab 12/05/22 2234 12/06/22 0810 12/07/22 0625  AST 35 46* 40  ALT 20 23 21   ALKPHOS 76 67 63  BILITOT 0.9 0.8 0.9  PROT 6.8 6.2* 6.5  ALBUMIN 3.7 3.4* 3.3*    No results for input(s): "HGBA1C" in the last 72 hours.  CBG: Recent Labs  Lab 12/11/22 0611 12/11/22 1247 12/11/22 1726 12/11/22 2112 12/12/22 0605  GLUCAP 223* 285* 228* 178* 182*    Recent Results (from the past 240 hour(s))  Resp panel by RT-PCR (RSV, Flu A&B, Covid) Anterior Nasal Swab     Status: Abnormal   Collection Time: 12/05/22 10:08 PM   Specimen: Anterior Nasal Swab  Result Value Ref Range Status   SARS Coronavirus 2 by RT PCR NEGATIVE NEGATIVE Final    Comment: (NOTE) SARS-CoV-2 target nucleic acids are NOT DETECTED.  The SARS-CoV-2 RNA is generally detectable in upper respiratory specimens during the acute phase of infection. The lowest concentration of SARS-CoV-2 viral copies this assay can detect is 138 copies/mL. A negative result does not preclude SARS-Cov-2 infection and should not be used as the sole basis for treatment or other patient management decisions. A negative result may occur with  improper specimen collection/handling, submission of specimen other than nasopharyngeal swab, presence of viral mutation(s) within the areas  targeted by this assay, and inadequate number of viral copies(<138 copies/mL). A negative result must be combined with clinical observations, patient history, and epidemiological information. The expected result is Negative.  Fact Sheet for Patients:  EntrepreneurPulse.com.au  Fact Sheet for Healthcare Providers:   IncredibleEmployment.be  This test is no t yet approved or cleared by the [place] FDA and  has been authorized for detection and/or diagnosis of SARS-CoV-2 by FDA under an Emergency Use Authorization (EUA). This EUA will remain  in effect (meaning this test can be used) for the duration of the COVID-19 declaration under Section 564(b)(1) of the Act, 21 U.S.C.section 360bbb-3(b)(1), unless the authorization is terminated  or revoked sooner.       Influenza A by PCR NEGATIVE NEGATIVE Final   Influenza B by PCR NEGATIVE NEGATIVE Final    Comment: (NOTE) The Xpert Xpress SARS-CoV-2/FLU/RSV plus assay is intended as an aid in the diagnosis of influenza from Nasopharyngeal swab specimens and should not be used as a sole basis for treatment. Nasal washings and aspirates are unacceptable for Xpert Xpress SARS-CoV-2/FLU/RSV testing.  Fact Sheet for Patients: EntrepreneurPulse.com.au  Fact Sheet for Healthcare Providers: IncredibleEmployment.be  This test is not yet approved or cleared by the [place] FDA and has been authorized for detection and/or diagnosis of SARS-CoV-2 by FDA under an Emergency Use Authorization (EUA). This EUA will remain in effect (meaning this test can be used) for the duration of the COVID-19 declaration under Section 564(b)(1) of the Act, 21 U.S.C. section 360bbb-3(b)(1), unless the authorization is terminated or revoked.     Resp Syncytial Virus by PCR POSITIVE (A) NEGATIVE Final    Comment: (NOTE) Fact Sheet for Patients: EntrepreneurPulse.com.au  Fact Sheet for Healthcare Providers: IncredibleEmployment.be  This test is not yet approved or cleared by the [place] FDA and has been authorized for detection and/or diagnosis of SARS-CoV-2 by FDA under an Emergency Use Authorization (EUA). This EUA will remain in effect (meaning this test can be used)  for the duration of the COVID-19 declaration under Section 564(b)(1) of the Act, 21 U.S.C. section 360bbb-3(b)(1), unless the authorization is terminated or revoked.  Performed at [facility], [place] [address], [place], [place] [address]   Respiratory (~20 pathogens) panel by PCR     Status: Abnormal   Collection Time: 12/05/22 10:08 PM   Specimen: Anterior Nasal Swab; Respiratory  Result Value Ref Range Status   Adenovirus NOT DETECTED NOT DETECTED Final   Coronavirus 229E NOT DETECTED NOT DETECTED Final    Comment: (NOTE) The Coronavirus on the Respiratory Panel, DOES NOT test for the novel  Coronavirus (2019 nCoV)    Coronavirus HKU1 NOT DETECTED NOT DETECTED Final   Coronavirus NL63 NOT DETECTED NOT DETECTED Final   Coronavirus OC43 NOT DETECTED NOT DETECTED Final   Metapneumovirus NOT DETECTED NOT DETECTED Final   Rhinovirus / Enterovirus NOT DETECTED NOT DETECTED Final   Influenza A NOT DETECTED NOT DETECTED Final   Influenza B NOT DETECTED NOT DETECTED Final   Parainfluenza Virus 1 NOT DETECTED NOT DETECTED Final   Parainfluenza Virus 2 NOT DETECTED NOT DETECTED Final   Parainfluenza Virus 3 NOT DETECTED NOT DETECTED Final   Parainfluenza Virus 4 NOT DETECTED NOT DETECTED Final   Respiratory Syncytial Virus DETECTED (A) NOT DETECTED Final   Bordetella pertussis NOT DETECTED NOT DETECTED Final   Bordetella Parapertussis NOT DETECTED NOT DETECTED Final   Chlamydophila pneumoniae NOT DETECTED NOT DETECTED Final   Mycoplasma pneumoniae NOT DETECTED  NOT DETECTED Final    Comment: Performed at [facility], [address], [place], [place] [address]  Urine Culture     Status: Abnormal   Collection Time: 12/06/22  1:09 AM   Specimen: Urine, Clean Catch  Result Value Ref Range Status   Specimen Description URINE, CLEAN CATCH  Final   Special Requests   Final    NONE Performed at [facility], [address], [place], [place] [address]    Culture MULTIPLE  SPECIES PRESENT, SUGGEST RECOLLECTION (A)  Final   Report Status 12/07/2022 FINAL  Final  Expectorated Sputum Assessment w Gram Stain, Rflx to Resp Cult     Status: None (Preliminary result)   Collection Time: 12/06/22  5:35 AM   Specimen: Expectorated Sputum  Result Value Ref Range Status   Specimen Description EXPECTORATED SPUTUM  Final   Special Requests NONE  Final   Sputum evaluation   Final    Sputum specimen not acceptable for testing.  Please recollect.   NOTIFIED RN REDEN ON 12/06/22 @ 2247 BY DRT  PREVIOUSLY REPORTED AS: FEW WBC PRESENT,BOTH PMN AND MONONUCLEAR FEW GRAM POSITIVE COCCI FEW GRAM NEGATIVE RODS FEW GRAM POSITIVE RODS Performed at [facility], [address] [place], [place] [address]    Report Status PENDING  Incomplete    Antimicrobials: Anti-infectives (From admission, onward)    Start     Dose/Rate Route Frequency Ordered Stop   12/06/22 1000  doxycycline (VIBRA-TABS) tablet 100 mg        100 mg Oral Every 12 hours 12/06/22 0535 12/10/22 2152      Culture/Microbiology    Component Value Date/Time   SDES EXPECTORATED SPUTUM 12/06/2022 0535   SPECREQUEST NONE 12/06/2022 0535   CULT MULTIPLE SPECIES PRESENT, SUGGEST RECOLLECTION (A) 12/06/2022 0109   REPTSTATUS PENDING 12/06/2022 0535  Other culture-see note  Radiology Studies: No results found.   LOS: 6 days   Lanae Boast, MD [facility]  12/12/2022, 7:14 AM

## 2022-12-12 NOTE — Progress Notes (Signed)
Pt refuses CPAP; will continue to monitor 

## 2022-12-12 NOTE — Progress Notes (Addendum)
Rounding Note    Patient Name: Victoria Delgado Date of Encounter: 12/12/2022  [place] Cardiologist: Minus Breeding, MD   Subjective   Patient reports that she continues to have a congested cough, improves when she is sitting upright. Abdominal pain is improving, but she continues to have mild pain when coughing. Some pain in left armpit, but no chest pain.   Reports that she would like to follow up with [facility]   Inpatient Medications    Scheduled Meds:  aspirin EC  81 mg Oral Daily   benzonatate  100 mg Oral TID   cholecalciferol  1,000 Units Oral BID   docusate sodium  100 mg Oral Daily   ezetimibe  10 mg Oral Daily   famotidine  40 mg Oral Daily   insulin aspart  0-5 Units Subcutaneous QHS   insulin aspart  0-9 Units Subcutaneous TID WC   levalbuterol  0.63 mg Nebulization TID   metoprolol succinate  50 mg Oral BID   multivitamin with minerals  1 tablet Oral Once per day on Mon Thu   predniSONE  40 mg Oral Q breakfast   Followed by   Derrill Memo ON 12/15/2022] predniSONE  30 mg Oral Q breakfast   Followed by   Derrill Memo ON 12/18/2022] predniSONE  20 mg Oral Q breakfast   Followed by   Derrill Memo ON 12/21/2022] predniSONE  10 mg Oral Q breakfast   rosuvastatin  20 mg Oral Daily   umeclidinium bromide  1 puff Inhalation Daily   Continuous Infusions:  PRN Meds: acetaminophen **OR** acetaminophen, alum & mag hydroxide-simeth, guaiFENesin-dextromethorphan, morphine injection, ondansetron **OR** ondansetron (ZOFRAN) IV   Vital Signs    Vitals:   12/12/22 0300 12/12/22 0449 12/12/22 0711 12/12/22 0821  BP: 121/60  (!) 119/59   Pulse: 69  71   Resp: 20  18   Temp: 97.8 F (36.6 C)  98.2 F (36.8 C)   TempSrc: Oral  Oral   SpO2: 95%  94% 92%  Weight:  124.6 kg    Height:        Intake/Output Summary (Last 24 hours) at 12/12/2022 0838 Last data filed at 12/12/2022 3419 Gross per 24 hour  Intake 240 ml  Output 1900 ml  Net -1660 ml      12/12/2022     4:49 AM 12/11/2022    3:44 AM 12/11/2022   12:24 AM  Last 3 Weights  Weight (lbs) 274 lb 11.2 oz 279 lb 15.8 oz 278 lb  Weight (kg) 124.603 kg 127 kg 126.1 kg      Telemetry    Normal sinus rhythm, HR int he 60s-70s. One 7 beat run of NSVT - Personally Reviewed  ECG    No new tracings since 1/3 - Personally Reviewed  Physical Exam   GEN: No acute distress. Laying in the bed with head elevated  Neck: No JVD with head elevated to 30 degrees  Cardiac: RRR, faint diastolic murmur at apex. Radial pulses 2+ bilaterally  Respiratory: Crackles that clear with cough. No wheezing. Normal WOB on room air with occasional congested cough  GI: Soft, mildly tender to palpation in right upper and lower quadrants  MS: No edema in BLE; No deformity. Neuro:  Nonfocal  Psych: Normal affect   Labs    High Sensitivity Troponin:   Recent Labs  Lab 12/05/22 2234 12/06/22 0109 12/06/22 0737 12/06/22 0810  TROPONINIHS 24* 747* 1,360* 1,200*     Chemistry Recent Labs  Lab  12/05/22 2234 12/05/22 2240 12/06/22 0810 12/07/22 0625 12/09/22 0637 12/10/22 0111 12/11/22 0137 12/12/22 0056  NA 132*   < > 135 137   < > 134* 133* 133*  K 3.2*   < > 3.5 3.6   < > 4.0 4.0 4.1  CL 99   < > 101 102   < > 100 97* 98  CO2 22  --  22 27   < > 28 27 28   GLUCOSE 183*   < > 270* 211*   < > 271* 194* 191*  BUN 14   < > 11 11   < > 23 35* 26*  CREATININE 1.06*   < > 0.98 0.81   < > 0.83 0.96 0.89  CALCIUM 8.5*  --  8.3* 8.5*   < > 8.3* 8.0* 8.2*  MG 2.1  --  2.0  --   --   --   --   --   PROT 6.8  --  6.2* 6.5  --   --   --   --   ALBUMIN 3.7  --  3.4* 3.3*  --   --   --   --   AST 35  --  46* 40  --   --   --   --   ALT 20  --  23 21  --   --   --   --   ALKPHOS 76  --  67 63  --   --   --   --   BILITOT 0.9  --  0.8 0.9  --   --   --   --   GFRNONAA 53*  --  59* >60   < > >60 >60 >60  ANIONGAP 11  --  12 8   < > 6 9 7    < > = values in this interval not displayed.    Lipids No results for input(s):  "CHOL", "TRIG", "HDL", "LABVLDL", "LDLCALC", "CHOLHDL" in the last 168 hours.  Hematology Recent Labs  Lab 12/10/22 0111 12/11/22 0137 12/12/22 0056  WBC 12.8* 18.5* 15.2*  RBC 3.96 3.41* 3.10*  HGB 11.1* 9.5* 8.9*  HCT 34.9* 30.4* 27.1*  MCV 88.1 89.1 87.4  MCH 28.0 27.9 28.7  MCHC 31.8 31.3 32.8  RDW 17.1* 17.3* 17.2*  PLT 240 285 208   Thyroid No results for input(s): "TSH", "FREET4" in the last 168 hours.  BNP Recent Labs  Lab 12/05/22 2234  BNP 446.5*    DDimer No results for input(s): "DDIMER" in the last 168 hours.   Radiology    No results found.  Cardiac Studies   Echocardiogram 12/06/22 1. Left ventricular ejection fraction, by estimation, is 70 to 75%. The  left ventricle has hyperdynamic function. The left ventricle has no  regional wall motion abnormalities. There is moderate left ventricular  hypertrophy. Left ventricular diastolic  parameters are indeterminate.   2. Resting peak LVOT gradient 84 mmHg   3. Right ventricular systolic function is normal. The right ventricular  size is normal. Tricuspid regurgitation signal is inadequate for assessing  PA pressure.   4. Left atrial size was mildly dilated.   5. A small pericardial effusion is present.   6. The mitral valve is degenerative. Trivial mitral valve regurgitation.  Severe mitral stenosis. The mean mitral valve gradient is 16.0 mmHg with  average heart rate of 82 bpm. Severe mitral annular calcification.   7. The aortic valve has been repaired/replaced. Aortic valve  regurgitation is not visualized. There is a 23 mm Edwards Sapien  prosthetic (TAVR) valve present in the aortic position. Procedure Date:  05/14/22. Vmax 3.2 m/s, MG 20 mmHg, EOA 2.2 cm^2, DI  0.58   Patient Profile     82 y.o. 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 Cardiology is following, planned for L and R heart cath 12/10/22, however, CTAP showed rectus sheath hematoma.    Assessment & Plan    NSTEMI  CAD s/p stenting of D1 and mLCx  - Patient presented with acute hypoxic respiratory failure secondary to RSV and COPD exacerbation  - hstn 24>747>1360>1200 - Most recent cardiac catheterization in 07/2020 (pre-TAVR) showed patent stents, moderate pulmonary hypertension with mean PA pressure 38 mmHg, mean wedge pressure 27 mmHg, PVR 1.6 Wood units   - Echo this admission with EF 70-75%, no regional wall motion abnormalities, moderate LVH, severe MS (mean gradient 16.0), s/p TAVR  - Initially planned R/L heart catheterization, however patient complained of ongoing abdominal pain, and CTAP 1/8 showed a large intramuscular hemorrhage in the right rectus abdominus. Heparin was stopped. R/L heart canceled  - Continue metoprolol succinate 50 mg BID, crestor 20 mg daily, zetia 10 mg daily  - Eliquis and heparin held. ASA OK per Dr. Eden Emms  - Patient reported that she would like to follow up with [facility]-- explained that we are not able to make appointments with their practice on her behalf, and she said that she would arrange her own follow up within the next 2-3 weeks. Stressed the importance of consistent follow up, and patient voiced understanding and agreement    Severe MS  - Echo this admission with severe MS, mean gradient 16 mmHg  - Patient was scheduled for a R/L heart cath, but was found to have a large rectus sheath hematoma.  - Will need RHC once patient is stable. Likely can be done as an outpatient  - As above, patient will arrange follow up with [facility]     Paroxysmal Atrial Fibrillation  - Per telemetry, patient is maintaining NSR  - Continue metoprolol succinate 50 mg BID  - As above, anticoagulation held due to rectus  sheath hematoma. Note, patient has history of GI bleed on eliquis. Initially expected patient to follow up with Dr. Lalla Brothers to discuss watchman, but patient would rather follow up with [facility]  - Plan to hold eliquis for 2 weeks. May need CT abdomen in 2 weeks prior to resuming eliquis  - Maintain K>4, mag>2    S/P TAVR 2021 - Echo this admission with functional valve    Chronic diastolic heart failure  - Patient euvolemic on exam - Continue metoprolol    Otherwise per primary  - RSV infection  - COPD exacerbation -- continues to have cough, but wheezing improving  - Pre-diabetes  - GERD - OSA  - HLD - Morbid obesity   For questions or updates, please contact [facility] Please consult [email or web address] for contact info under        Signed, Jonita Albee, PA-C  12/12/2022, 8:38 AM

## 2022-12-12 NOTE — Progress Notes (Signed)
Mobility Specialist Progress Note:   12/12/22 1510  Mobility  Activity Ambulated with assistance in room  Level of Assistance Standby assist, set-up cues, supervision of patient - no hands on  Assistive Device Front wheel walker  Distance Ambulated (ft) 20 ft  Activity Response Tolerated well  $Mobility charge 1 Mobility   Pt in chair willing to participate in mobility. Complaints of pain d/t hematoma. Left in chair at sink with NT to get washed up.    Gareth Eagle Diane Mochizuki Mobility Specialist Please contact via [employer] or  Rehab Office at [phone]

## 2022-12-12 NOTE — Progress Notes (Signed)
Physical Therapy Treatment Patient Details Name: Victoria Delgado MRN: [id number] DOB: [birth year] Today's Date: 12/12/2022   History of Present Illness Pt is a 82 y.o. female admitted 12/05/22 with acute respiratory failure, RSV infection. Course complicated by NSTEMI, afib RVR. Planned L and R heart cath but delayed due to rectus sheath hematoma. PMH includes CAD, AS s/p TAVR, afib, OSA, emphysema, obesity.    PT Comments    Pt received sitting on toilet upon PT arrival, reportedly just finished having her first BM in about one week. Pt is limited in activity tolerance by her abdominal pain, declining further gait attempts or exercises this date. I anticipate pt will progress well once her pain improves though, thus current recommendations remain appropriate. Educated pt on reducing sodium intake, weighing self daily, safely progressing mobility/walking program, and energy conservation techniques. She verbalized understanding of education and of encouragement to ambulate >/= 3x/day while here. Will continue to follow acutely.   Recommendations for follow up therapy are one component of a multi-disciplinary discharge planning process, led by the attending physician.  Recommendations may be updated based on patient status, additional functional criteria and insurance authorization.  Follow Up Recommendations  No PT follow up     Assistance Recommended at Discharge PRN  Patient can return home with the following Assistance with cooking/housework;Assist for transportation;A little help with bathing/dressing/bathroom   Equipment Recommendations  None recommended by PT    Recommendations for Other Services       Precautions / Restrictions Precautions Precautions: Fall Precaution Comments: watch SpO2 (does not wear O2 baseline) Restrictions Weight Bearing Restrictions: No     Mobility  Bed Mobility               General bed mobility comments: Pt on toilet upon arrival.     Transfers Overall transfer level: Needs assistance Equipment used: None Transfers: Sit to/from Stand Sit to Stand: Min guard           General transfer comment: Extra time and effort to come to stand from toilet x1 rep, increased ease from recliner x1 rep. Min guard for safety and cues provided to reach back to control descent to sit.    Ambulation/Gait Ambulation/Gait assistance: Supervision Gait Distance (Feet): 25 Feet Assistive device: Rolling walker (2 wheels) Gait Pattern/deviations: Step-through pattern, Decreased stride length, Trunk flexed Gait velocity: Decreased Gait velocity interpretation: <1.8 ft/sec, indicate of risk for recurrent falls   General Gait Details: Pt with slow gait within room, limiting distance due to pain and DOE. Pt repeatedly reaching for bed rail or wall rails to hold with one hand while pushing RW with other hand, needing cues to keep bil hands on RW and remain proximal to it. No LOB, supervision for safety   Stairs             Wheelchair Mobility    Modified Rankin (Stroke Patients Only)       Balance Overall balance assessment: Needs assistance Sitting-balance support: No upper extremity supported, Feet supported Sitting balance-Leahy Scale: Good     Standing balance support: No upper extremity supported, Bilateral upper extremity supported, During functional activity Standing balance-Leahy Scale: Fair Standing balance comment: Able to stand without UE support but prefers it due to pain                            Cognition Arousal/Alertness: Awake/alert Behavior During Therapy: WFL for tasks assessed/performed Overall Cognitive Status: Within Functional Limits  for tasks assessed                                          Exercises      General Comments General comments (skin integrity, edema, etc.): Educated pt on reducing sodium intake, weighing self daily, safely progressing  mobility/walking program, and energy conservation techniques; she verbalized understanding of education and of encouragement to ambulate >/= 3x/day while here; VSS (unhooked during mobility as pt was unhooked sitting on toilet upon arrival) on RA      Pertinent Vitals/Pain Pain Assessment Pain Assessment: Faces Faces Pain Scale: Hurts even more Pain Location: abdomen Pain Descriptors / Indicators: Discomfort, Grimacing, Guarding Pain Intervention(s): Limited activity within patient's tolerance, Monitored during session    Home Living                          Prior Function            PT Goals (current goals can now be found in the care plan section) Acute Rehab PT Goals Patient Stated Goal: to feel better PT Goal Formulation: With patient Time For Goal Achievement: 12/21/22 Potential to Achieve Goals: Good Progress towards PT goals: Progressing toward goals    Frequency    Min 3X/week      PT Plan Current plan remains appropriate    Co-evaluation              AM-PAC PT "6 Clicks" Mobility   Outcome Measure  Help needed turning from your back to your side while in a flat bed without using bedrails?: None Help needed moving from lying on your back to sitting on the side of a flat bed without using bedrails?: None Help needed moving to and from a bed to a chair (including a wheelchair)?: A Little Help needed standing up from a chair using your arms (e.g., wheelchair or bedside chair)?: A Little Help needed to walk in hospital room?: A Little Help needed climbing 3-5 steps with a railing? : A Little 6 Click Score: 20    End of Session   Activity Tolerance: Patient limited by pain Patient left: in chair;with call bell/phone within reach   PT Visit Diagnosis: Other abnormalities of gait and mobility (R26.89);Unsteadiness on feet (R26.81)     Time: 5027-7412 PT Time Calculation (min) (ACUTE ONLY): 14 min  Charges:  $Therapeutic Activity: 8-22  mins                     Moishe Spice, PT, DPT Acute Rehabilitation Services  Office: [phone]    Orvan Falconer 12/12/2022, 10:33 AM

## 2022-12-13 LAB — CBC
HCT: 26 % — ABNORMAL LOW (ref 36.0–46.0)
Hemoglobin: 8.3 g/dL — ABNORMAL LOW (ref 12.0–15.0)
MCH: 28.3 pg (ref 26.0–34.0)
MCHC: 31.9 g/dL (ref 30.0–36.0)
MCV: 88.7 fL (ref 80.0–100.0)
Platelets: 192 10*3/uL (ref 150–400)
RBC: 2.93 MIL/uL — ABNORMAL LOW (ref 3.87–5.11)
RDW: 17.7 % — ABNORMAL HIGH (ref 11.5–15.5)
WBC: 11.6 10*3/uL — ABNORMAL HIGH (ref 4.0–10.5)
nRBC: 1.6 % — ABNORMAL HIGH (ref 0.0–0.2)

## 2022-12-13 LAB — EXPECTORATED SPUTUM ASSESSMENT W GRAM STAIN, RFLX TO RESP C

## 2022-12-13 LAB — BASIC METABOLIC PANEL
Anion gap: 7 (ref 5–15)
BUN: 23 mg/dL (ref 8–23)
CO2: 29 mmol/L (ref 22–32)
Calcium: 8.3 mg/dL — ABNORMAL LOW (ref 8.9–10.3)
Chloride: 100 mmol/L (ref 98–111)
Creatinine, Ser: 0.88 mg/dL (ref 0.44–1.00)
GFR, Estimated: >60 mL/min (ref >60)
Glucose, Bld: 171 mg/dL — ABNORMAL HIGH (ref 70–99)
Potassium: 4.5 mmol/L (ref 3.5–5.1)
Sodium: 136 mmol/L (ref 135–145)

## 2022-12-13 LAB — URINE CULTURE: Culture: 50000 — AB

## 2022-12-13 LAB — HEMOGLOBIN AND HEMATOCRIT, BLOOD
HCT: 27.3 % — ABNORMAL LOW (ref 36.0–46.0)
Hemoglobin: 8.9 g/dL — ABNORMAL LOW (ref 12.0–15.0)

## 2022-12-13 LAB — GLUCOSE, CAPILLARY
Glucose-Capillary: 148 mg/dL — ABNORMAL HIGH (ref 70–99)
Glucose-Capillary: 177 mg/dL — ABNORMAL HIGH (ref 70–99)

## 2022-12-13 MED ORDER — FOLIC ACID 1 MG PO TABS
1.0000 mg | ORAL_TABLET | Freq: Every day | ORAL | Status: DC
  Administered 2022-12-13: 1 mg via ORAL
  Filled 2022-12-13: qty 1

## 2022-12-13 MED ORDER — FAMOTIDINE 40 MG PO TABS: 40.0000 mg | ORAL_TABLET | Freq: Every day | ORAL | 0 refills | Status: DC

## 2022-12-13 MED ORDER — FERROUS SULFATE 325 (65 FE) MG PO TABS: 325.0000 mg | ORAL_TABLET | Freq: Every day | ORAL | Status: DC

## 2022-12-13 MED ORDER — FERROUS SULFATE 325 (65 FE) MG PO TABS: 325.0000 mg | ORAL_TABLET | Freq: Every day | ORAL | 0 refills | Status: DC

## 2022-12-13 MED ORDER — FOLIC ACID 1 MG PO TABS: 1.0000 mg | ORAL_TABLET | Freq: Every day | ORAL | 0 refills | Status: AC

## 2022-12-13 MED ORDER — ASPIRIN 81 MG PO TBEC: 81.0000 mg | DELAYED_RELEASE_TABLET | Freq: Every day | ORAL | 0 refills | Status: AC

## 2022-12-13 MED ORDER — CEPHALEXIN 500 MG PO CAPS: 500.0000 mg | ORAL_CAPSULE | Freq: Three times a day (TID) | ORAL | 0 refills | Status: AC

## 2022-12-13 MED ORDER — ACETAMINOPHEN 325 MG PO TABS: 650.0000 mg | ORAL_TABLET | Freq: Once | ORAL | Status: DC

## 2022-12-13 MED ORDER — BENZONATATE 100 MG PO CAPS: 100.0000 mg | ORAL_CAPSULE | Freq: Three times a day (TID) | ORAL | 0 refills | Status: DC

## 2022-12-13 MED ORDER — FUROSEMIDE 10 MG/ML IJ SOLN: 20.0000 mg | Freq: Once | INTRAMUSCULAR | Status: DC

## 2022-12-13 MED ORDER — SODIUM CHLORIDE 0.9% IV SOLUTION: Freq: Once | INTRAVENOUS | Status: DC

## 2022-12-13 MED ORDER — PREDNISONE 10 MG PO TABS: ORAL_TABLET | ORAL | 0 refills | Status: DC

## 2022-12-13 MED ORDER — GUAIFENESIN-DM 100-10 MG/5ML PO SYRP: 5.0000 mL | ORAL_SOLUTION | ORAL | 0 refills | Status: DC | PRN

## 2022-12-13 NOTE — Progress Notes (Signed)
Occupational Therapy Treatment Patient Details Name: Victoria Delgado MRN: [id number] DOB: Oct 28, 1941 Today's Date: 12/13/2022   History of present illness Pt is a 82 y.o. female admitted 12/05/22 with acute respiratory failure, RSV infection. Course complicated by NSTEMI, afib RVR. Planned L and R heart cath but delayed due to rectus sheath hematoma. PMH includes CAD, AS s/p TAVR, afib, OSA, emphysema, obesity.   OT comments  Patient progressing well towards goals. Reviewed energy conservation and activity tolerance progression, pt with good understanding. Has DME needed, will purchase shower stool if needed (may have one at home).  Continues to require mod assist for LB dressing but spouse can assist, supervision for transfers and mobility using RW.  Will follow acutely.    Recommendations for follow up therapy are one component of a multi-disciplinary discharge planning process, led by the attending physician.  Recommendations may be updated based on patient status, additional functional criteria and insurance authorization.    Follow Up Recommendations  No OT follow up     Assistance Recommended at Discharge PRN  Patient can return home with the following  A little help with walking and/or transfers;A lot of help with bathing/dressing/bathroom;Assist for transportation;Help with stairs or ramp for entrance;Assistance with cooking/housework   Equipment Recommendations  BSC/3in1    Recommendations for Other Services      Precautions / Restrictions Precautions Precautions: Fall Precaution Comments: watch SpO2 (does not wear O2 baseline) Restrictions Weight Bearing Restrictions: No       Mobility Bed Mobility Overal bed mobility: Modified Independent                  Transfers Overall transfer level: Needs assistance Equipment used: Rolling walker (2 wheels) Transfers: Sit to/from Stand Sit to Stand: Supervision                 Balance Overall balance  assessment: Needs assistance Sitting-balance support: No upper extremity supported, Feet supported Sitting balance-Leahy Scale: Good     Standing balance support: No upper extremity supported, During functional activity Standing balance-Leahy Scale: Fair Standing balance comment: Able to stand without UE support but prefers it due to pain                           ADL either performed or assessed with clinical judgement   ADL Overall ADL's : Needs assistance/impaired     Grooming: Supervision/safety;Wash/dry hands;Standing               Lower Body Dressing: Moderate assistance;Sit to/from stand Lower Body Dressing Details (indicate cue type and reason): assist for socks, spouse will assist at home- supervision in standing Toilet Transfer: Supervision/safety;Ambulation;Rolling walker (2 wheels)   Toileting- Clothing Manipulation and Hygiene: Supervision/safety;Sit to/from stand       Functional mobility during ADLs: Supervision/safety;Rolling walker (2 wheels)      Extremity/Trunk Assessment              Vision       Perception     Praxis      Cognition Arousal/Alertness: Awake/alert Behavior During Therapy: WFL for tasks assessed/performed Overall Cognitive Status: Within Functional Limits for tasks assessed                                          Exercises      Shoulder Instructions  General Comments Educated on energy conservation techniques and activity progression    Pertinent Vitals/ Pain       Pain Assessment Pain Assessment: Faces Faces Pain Scale: Hurts little more Pain Location: abdomen Pain Descriptors / Indicators: Discomfort, Grimacing, Guarding Pain Intervention(s): Limited activity within patient's tolerance, Monitored during session, Repositioned  Home Living                                          Prior Functioning/Environment              Frequency  Min 2X/week         Progress Toward Goals  OT Goals(current goals can now be found in the care plan section)  Progress towards OT goals: Progressing toward goals  Acute Rehab OT Goals Patient Stated Goal: home OT Goal Formulation: With patient Time For Goal Achievement: 12/25/22 Potential to Achieve Goals: Good  Plan Discharge plan remains appropriate;Frequency remains appropriate    Co-evaluation                 AM-PAC OT "6 Clicks" Daily Activity     Outcome Measure   Help from another person eating meals?: None Help from another person taking care of personal grooming?: A Little Help from another person toileting, which includes using toliet, bedpan, or urinal?: A Little Help from another person bathing (including washing, rinsing, drying)?: A Lot Help from another person to put on and taking off regular upper body clothing?: A Little Help from another person to put on and taking off regular lower body clothing?: A Lot 6 Click Score: 17    End of Session Equipment Utilized During Treatment: Rolling walker (2 wheels)  OT Visit Diagnosis: Other abnormalities of gait and mobility (R26.89);Pain Pain - part of body:  (abd)   Activity Tolerance Patient tolerated treatment well   Patient Left in chair;with call bell/phone within reach;with family/visitor present   Nurse Communication Mobility status (hematoma pain higher on abdomen)        Time: 7371-0626 OT Time Calculation (min): 16 min  Charges: OT General Charges $OT Visit: 1 Visit OT Treatments $Self Care/Home Management : 8-22 mins  [place] Office [phone]   Delight Stare 12/13/2022, 1:53 PM

## 2022-12-13 NOTE — Progress Notes (Addendum)
Rounding Note    Patient Name: Victoria Delgado Date of Encounter: 12/13/2022  [facility] Cardiologist: Rollene Rotunda, MD   Subjective   Patient reports feeling sluggish this AM. Feels weak and tired, and has mild lightheadedness. Did not sleep well overnight. Denies chest pain, palpitations. Abdominal pain is improving. Occasional cough.   Receiving nebulizer treatment   Inpatient Medications    Scheduled Meds:  aspirin EC  81 mg Oral Daily   benzonatate  100 mg Oral TID   cephALEXin  500 mg Oral Q8H   cholecalciferol  1,000 Units Oral BID   docusate sodium  100 mg Oral Daily   ezetimibe  10 mg Oral Daily   famotidine  40 mg Oral Daily   insulin aspart  0-5 Units Subcutaneous QHS   insulin aspart  0-9 Units Subcutaneous TID WC   levalbuterol  0.63 mg Nebulization TID   metoprolol succinate  50 mg Oral BID   multivitamin with minerals  1 tablet Oral Once per day on Mon Thu   predniSONE  40 mg Oral Q breakfast   Followed by   Melene Muller ON 12/15/2022] predniSONE  30 mg Oral Q breakfast   Followed by   Melene Muller ON 12/18/2022] predniSONE  20 mg Oral Q breakfast   Followed by   Melene Muller ON 12/21/2022] predniSONE  10 mg Oral Q breakfast   rosuvastatin  20 mg Oral Daily   umeclidinium bromide  1 puff Inhalation Daily   Continuous Infusions:  PRN Meds: acetaminophen **OR** acetaminophen, alum & mag hydroxide-simeth, guaiFENesin-dextromethorphan, morphine injection, ondansetron **OR** ondansetron (ZOFRAN) IV   Vital Signs    Vitals:   12/13/22 0337 12/13/22 0500 12/13/22 0729 12/13/22 0802  BP: (!) 114/53   (!) 143/51  Pulse: 68   73  Resp: 20   14  Temp: 98 F (36.7 C)   97.8 F (36.6 C)  TempSrc: Oral   Oral  SpO2: 96%  98% 92%  Weight:  123.7 kg    Height:        Intake/Output Summary (Last 24 hours) at 12/13/2022 0830 Last data filed at 12/13/2022 0813 Gross per 24 hour  Intake 460 ml  Output 0 ml  Net 460 ml      12/13/2022    5:00 AM 12/12/2022     4:49 AM 12/11/2022    3:44 AM  Last 3 Weights  Weight (lbs) 272 lb 11.3 oz 274 lb 11.2 oz 279 lb 15.8 oz  Weight (kg) 123.7 kg 124.603 kg 127 kg      Telemetry    NSR, hr in the 60s-70s - Personally Reviewed  ECG    No new tracings since 1/3 - Personally Reviewed  Physical Exam   GEN: No acute distress. Sitting in the bed with head elevated. Nebulizer treatment  Neck: No JVD while sitting upright  Cardiac: RRR, faint diastolic murmur at apex. Mechanical S2 click. Radial pulses 2+ bilaterally  Respiratory: Expiratory wheezes throughout  GI: Soft, mildly tender in right lower quadrant  MS: No edema in BLE ; No deformity. Neuro:  Nonfocal  Psych: Normal affect   Labs    High Sensitivity Troponin:   Recent Labs  Lab 12/05/22 2234 12/06/22 0109 12/06/22 0737 12/06/22 0810  TROPONINIHS 24* 747* 1,360* 1,200*     Chemistry Recent Labs  Lab 12/07/22 0625 12/09/22 0637 12/11/22 0137 12/12/22 0056 12/13/22 0104  NA 137   < > 133* 133* 136  K 3.6   < >  4.0 4.1 4.5  CL 102   < > 97* 98 100  CO2 27   < > 27 28 29   GLUCOSE 211*   < > 194* 191* 171*  BUN 11   < > 35* 26* 23  CREATININE 0.81   < > 0.96 0.89 0.88  CALCIUM 8.5*   < > 8.0* 8.2* 8.3*  PROT 6.5  --   --   --   --   ALBUMIN 3.3*  --   --   --   --   AST 40  --   --   --   --   ALT 21  --   --   --   --   ALKPHOS 63  --   --   --   --   BILITOT 0.9  --   --   --   --   GFRNONAA >60   < > >60 >60 >60  ANIONGAP 8   < > 9 7 7    < > = values in this interval not displayed.    Lipids No results for input(s): "CHOL", "TRIG", "HDL", "LABVLDL", "LDLCALC", "CHOLHDL" in the last 168 hours.  Hematology Recent Labs  Lab 12/11/22 0137 12/12/22 0056 12/13/22 0104  WBC 18.5* 15.2* 11.6*  RBC 3.41* 3.10* 2.93*  HGB 9.5* 8.9* 8.3*  HCT 30.4* 27.1* 26.0*  MCV 89.1 87.4 88.7  MCH 27.9 28.7 28.3  MCHC 31.3 32.8 31.9  RDW 17.3* 17.2* 17.7*  PLT 285 208 192   Thyroid No results for input(s): "TSH", "FREET4" in the  last 168 hours.  BNPNo results for input(s): "BNP", "PROBNP" in the last 168 hours.  DDimer No results for input(s): "DDIMER" in the last 168 hours.   Radiology    No results found.  Cardiac Studies   Echocardiogram 12/06/22 1. Left ventricular ejection fraction, by estimation, is 70 to 75%. The  left ventricle has hyperdynamic function. The left ventricle has no  regional wall motion abnormalities. There is moderate left ventricular  hypertrophy. Left ventricular diastolic  parameters are indeterminate.   2. Resting peak LVOT gradient 84 mmHg   3. Right ventricular systolic function is normal. The right ventricular  size is normal. Tricuspid regurgitation signal is inadequate for assessing  PA pressure.   4. Left atrial size was mildly dilated.   5. A small pericardial effusion is present.   6. The mitral valve is degenerative. Trivial mitral valve regurgitation.  Severe mitral stenosis. The mean mitral valve gradient is 16.0 mmHg with  average heart rate of 82 bpm. Severe mitral annular calcification.   7. The aortic valve has been repaired/replaced. Aortic valve  regurgitation is not visualized. There is a 23 mm Edwards Sapien  prosthetic (TAVR) valve present in the aortic position. Procedure Date:  05/14/22. Vmax 3.2 m/s, MG 20 mmHg, EOA 2.2 cm^2, DI  0.58   Patient Profile     82 y.o. 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 Cardiology is following, planned for L and R heart cath 12/10/22, however, CTAP showed rectus sheath hematoma.     Assessment & Plan    NSTEMI  CAD s/p stenting of D1  and mLCx  - Patient presented with acute hypoxic respiratory failure secondary to RSV and COPD  exacerbation. Breathing now improved  - hstn 24>747>1360>1200 - Echo this admission with EF 70-75%, no regional wall motion abnormalities, moderate LVH, severe MS (mean gradient 16.0), s/p TAVR  - Initially planned R/L heart catheterization, however patient complained of ongoing abdominal pain, and CTAP 1/8 showed a large intramuscular hemorrhage in the right rectus abdominus. Heparin was stopped. R/L heart canceled  - Continue metoprolol succinate 50 mg BID, crestor 20 mg daily, zetia 10 mg daily  - Eliquis and heparin held. ASA OK per Dr. Johnsie Cancel  - Patient reported that she would like to follow up with [facility]-- she has arranged her follow up appointments.    Severe MS  - Echo this admission with severe MS, mean gradient 16 mmHg  - Patient was scheduled for a R/L heart cath, but was found to have a large rectus sheath hematoma.  - Likely needs RHC- will be done at [facility].   Paroxysmal Atrial Fibrillation  - Per telemetry, patient is maintaining NSR  - Continue metoprolol succinate 50 mg BID  - As above, anticoagulation held due to rectus sheath hematoma. Note, patient has history of GI bleed on eliquis. Initially expected patient to follow up with Dr. Quentin Ore to discuss watchman, but patient would rather follow up with [facility]  - Plan to hold eliquis for 2 weeks. As patient is following up with [facility], they will need to follow her Hct and perform scans as they see fit   - Maintain K>4, mag>2    S/P TAVR 2021 - Echo this admission with functional valve    Chronic diastolic heart failure  - Patient euvolemic on exam - Continue metoprolol    Otherwise per primary  - R Rectus Sheath Hematoma-- hemoglobin and hematocrit both downtrending (hemoglobin was 12.4 on 11/6, down to 8.3 today) consider blood transfusion. Suspect drop in hemoglobin/hematocrit is contributing to patient's fatigue, weakness  - RSV infection  - COPD exacerbation- continues to have expiratory wheezing on exam, reports breathing  improves with nebulizer treatments and inhalers  - Pre-diabetes  - GERD - OSA  - HLD - Morbid obesity   For questions or updates, please contact [place] Please consult [email or web address] for contact info under        Signed, Margie Billet, PA-C  12/13/2022, 8:30 AM     Patient examined chart reviewed. Abdomen more benign post rectus sheath bleed maintaining NSR Off Eliquis Day 3/14 She may have symptomatic anemia given large fall witch continues Given her need to resume anticoagulation in near future would transfuse one unit and not wait till Hb of 7  She needs to get OOB other comorbidities and RSV per hospitalist  Jenkins Rouge MD [facility]

## 2022-12-13 NOTE — Progress Notes (Signed)
Order received to discharge patient.  Telemetry monitor removed and CCMD notified.  PIV access removed x2.  Discharge instructions, follow up, medications and instructions for their use discussed with patient. 

## 2022-12-13 NOTE — Plan of Care (Signed)
  Problem: Education: Goal: Knowledge of disease or condition will improve Outcome: Progressing Goal: Knowledge of the prescribed therapeutic regimen will improve Outcome: Progressing Goal: Individualized Educational Video(s) Outcome: Progressing   Problem: Activity: Goal: Ability to tolerate increased activity will improve Outcome: Progressing Goal: Will verbalize the importance of balancing activity with adequate rest periods Outcome: Progressing   Problem: Respiratory: Goal: Ability to maintain a clear airway will improve Outcome: Progressing Goal: Levels of oxygenation will improve Outcome: Progressing Goal: Ability to maintain adequate ventilation will improve Outcome: Progressing   Problem: Education: Goal: Ability to describe self-care measures that may prevent or decrease complications (Diabetes Survival Skills Education) will improve Outcome: Progressing Goal: Individualized Educational Video(s) Outcome: Progressing   Problem: Coping: Goal: Ability to adjust to condition or change in health will improve Outcome: Progressing   Problem: Fluid Volume: Goal: Ability to maintain a balanced intake and output will improve Outcome: Progressing   Problem: Health Behavior/Discharge Planning: Goal: Ability to identify and utilize available resources and services will improve Outcome: Progressing Goal: Ability to manage health-related needs will improve Outcome: Progressing   Problem: Metabolic: Goal: Ability to maintain appropriate glucose levels will improve Outcome: Progressing   Problem: Nutritional: Goal: Maintenance of adequate nutrition will improve Outcome: Progressing Goal: Progress toward achieving an optimal weight will improve Outcome: Progressing   Problem: Skin Integrity: Goal: Risk for impaired skin integrity will decrease Outcome: Progressing   Problem: Tissue Perfusion: Goal: Adequacy of tissue perfusion will improve Outcome: Progressing   

## 2022-12-13 NOTE — TOC Transition Note (Signed)
Transition of Care [facility]) - CM/SW Discharge Note   Patient Details  Name: Victoria Delgado MRN: [id number] Date of Birth: May 05, 1941  [facility] CM/SW Contact:  Levonne Lapping, RN Phone Number: 12/13/2022, 12:08 PM   Clinical Narrative:    CM met with Patient and her Husband, bedside. CM confirmed plan is to dc home with Spouse- No follow up PT/OT is recommended. Patient is declining 3 in 1 [facility] that is recommended, stating that she has no problem getting to the bathroom here and will not have any problems at home. PCP is established and confirmed by patient . Husband to transport home.   No additional TOC needs           Patient Goals and CMS Choice      Discharge Placement                         Discharge Plan and Services Additional resources added to the After Visit Summary for                                       Social Determinants of Health (SDOH) Interventions SDOH Screenings   Food Insecurity: No Food Insecurity (12/08/2022)  Housing: Low Risk  (12/08/2022)  Transportation Needs: No Transportation Needs (12/08/2022)  Utilities: Not At Risk (12/08/2022)  Tobacco Use: Medium Risk (12/08/2022)     Readmission Risk Interventions     No data to display

## 2022-12-13 NOTE — Discharge Summary (Signed)
Physician Discharge Summary  Victoria Delgado NUU:[id number] DOB: [birth year] DOA: 12/05/2022  PCP: Charlane Ferretti, DO  Admit date: 12/05/2022 Discharge date: 12/13/2022 Recommendations for Outpatient Follow-up:  Follow up with PCP in 1 weeks-call for appointment Please obtain BMP/CBC in one week  Discharge Dispo: Home Discharge Condition: Stable Code Status:   Code Status: Full Code Diet recommendation:  Diet Order             Diet Heart Room service appropriate? Yes; Fluid consistency: Thin  Diet effective now                    Brief/Interim Summary: 82 y.o. female with PMH significant for obesity, OSA on CPAP, HTN, HLD, CAD/stents, AS s/p TAVR, paroxysmal A-fib on Eliquis, GI bleed, GERD presented to the ED 12/05/22 with complaint of cough, shortness of breath for 1 week which progressed significantly in the last 3 days. EMS noted O2 sat low at 89% on room air.  She was wheezing, given bronchodilators, started on supplemental oxygen and brought to the ED.She also had low-grade fever, dysuria, N/V/D.  There was possible concern of UTI. In the ED, patient was afebrile, heart rate 113, breathing on 2 L oxygen by nasal cannula. While in the ED, she had an episode of A-fib with RVR to 198, she was started on amiodarone drip and subsequently converted to normal sinus rhythm. Respiratory panel positive for RSV, HKV:[id number] thickening may be bronchitic or congestive.  Patient admitted for acute COPD exacerbation with RSV bronchitis A-fib with RVR and also elevated troponin Patient was managed for NSTEMI in the setting of CAD with previous stenting, placed on heparin drip meds adjusted with beta-blocker.  Cardiology is planning for RHC LHC. patient has a spontaneous large intramuscular hemorrhage and right rectus abdominis, anticoagulation discontinued> cardiac cath deferred at this time pending stabilization of hematoma and will be pursued outpatient.  Patient wanted to follow-up with  [facility]. She has been mobilizing well respiratory status improved although having wheezing cough at times not needing oxygen. Discussed with cardiology today okay for discharge home with outpatient follow-up with cardiology.  Initially agreed for blood transfusion but patient then declined and wanted to follow-up as outpatient advised to see PCP next Monday to check CBC, started on iron and folate  Discharge Diagnoses:  Principal Problem:   SVT (supraventricular tachycardia)  Acute COPD exacerbation due to RSV bronchitis Acute respiratory failure with hypoxia: Patient  with worsening of respiratory status with COPD exacerbation in the setting of RSV infection.CXR with bronchitis changes.  Overall clinically improved,a continue on antibiotics,IS,aggressive bronchodilators,along with steroid taper ord ered per pulmonary, off o2.    Elevated troponin/NSTEMI:In the setting of A-fib with RVR likely type II demand ischemia but per cardiology needs LHC but now on hold per cardio likely will be done outpatient> but patient wants to pursue at [facility] and she has made an appointment.Cardiology okay with aspirin and resumed.  Continue Zetia and statin.Heparin stopped due to hematoma. Severe MS: Cardiology initially planned for RHC/LHC to assess further but now on hold.  Cardiac cath likely will be done as outpatient at [facility].   PAF with RVR:HR is controlled on metoprolol. Low threshold to add back amiodarone if AF recurs. Heparin held due to rectus sheath hematoma> anticoagulation to be held for 2 weeks then rescan of the abdomen prior to resuming her home anticoagulation, this will be pursued by her outpatient cardiology/[facility], patient verbalized understanding.   Abdominal pain with large intramuscular hemorrhage in the  right rectus abdominis 12.2x7.2x19.9. Acute blood loss anemia due to intestinal hematoma drop in hemoglobin noted monitor and transfuse if less than 7 g. Heparin has been held 1/7.  H&H drifting but  holding above 8 g. Initially agreed for blood transfusion 1/11 but patient then declined and wanted to follow-up as outpatient advised to see PCP next Monday to check CBC, started on iron and folateand evaluation once okay with cardiology likely in a week or 2 as outpatient.Pain overall much improved, continue as needed morphine.   Recent Labs  Lab 12/09/22 0113 12/10/22 0111 12/11/22 0137 12/12/22 0056 12/13/22 0104  HGB 12.1 11.1* 9.5* 8.9* 8.3*  HCT 39.5 34.9* 30.4* 27.1* 26.0*    HLD continue statin and Zetia Morbid  Obesity:Patient's Body mass index is 41.77 kg/m.Will benefit with PCP follow-up,weight loss  healthy lifestyle and outpatient sleep evaluation. Dysuria: UA abnormal send urine culture empiric antibiotics given leukocytosis and dysuria  Consults: Cardiology Pulmonary Subjective: Alert awake oriented resting comfortably.  She would like to go home today  Discharge Exam: Vitals:   12/13/22 0802 12/13/22 0831  BP: (!) 143/51   Pulse: 73   Resp: 14   Temp: 97.8 F (36.6 C)   SpO2: 92% 93%   General: Pt is alert, awake, not in acute distress Cardiovascular: RRR, S1/S2 +, no rubs, no gallops Respiratory: CTA bilaterally, no wheezing, no rhonchi Abdominal: Soft, NT, ND, bowel sounds + Extremities: no edema, no cyanosis  Discharge Instructions  Discharge Instructions     (HEART FAILURE PATIENTS) Call MD:  Anytime you have any of the following symptoms: 1) 3 pound weight gain in 24 hours or 5 pounds in 1 week 2) shortness of breath, with or without a dry hacking cough 3) swelling in the hands, feet or stomach 4) if you have to sleep on extra pillows at night in order to breathe.   Complete by: As directed    Discharge instructions   Complete by: As directed    Check CBC in 3 to 4 days from PCP to ensure hemoglobin is stable  Eliquis to be held for 2 weeks: [facility] to resume preferably 2 weeks after bleeding  Please call call MD  or return to ER for similar or worsening recurring problem that brought you to hospital or if any fever,nausea/vomiting,abdominal pain, uncontrolled pain, chest pain,  shortness of breath or any other alarming symptoms.  Please follow-up your doctor as instructed in a week time and call the office for appointment.  Please avoid alcohol, smoking, or any other illicit substance and maintain healthy habits including taking your regular medications as prescribed.  You were cared for by a hospitalist during your hospital stay. If you have any questions about your discharge medications or the care you received while you were in the hospital after you are discharged, you can call the unit and ask to speak with the hospitalist on call if the hospitalist that took care of you is not available.  Once you are discharged, your primary care physician will handle any further medical issues. Please note that NO REFILLS for any discharge medications will be authorized once you are discharged, as it is imperative that you return to your primary care physician (or establish a relationship with a primary care physician if you do not have one) for your aftercare needs so that they can reassess your need for medications and monitor your lab values   Increase activity slowly   Complete by:  As directed       Allergies as of 12/13/2022       Reactions   Codeine Nausea Only   Kenalog [triamcinolone Acetonide] Other (See Comments)   Had A.fib after she received the injection."Steroid meds"   Kenalog [triamcinolone] Rash   Other reaction(s): AFIB   Pantoprazole Rash   Other reaction(s): chronic upper GI pain all the way to back   Relafen [nabumetone] Other (See Comments)   Edema   Epinephrine Palpitations   Penicillins Rash   40 years ago, injection site was red and swollen.  rash, facial/tongue/throat swelling, SOB or lightheadedness with hypotension: Yes Has patient had a PCN reaction causing immediate  Has  patient had a PCN reaction causing severe rash involving mucus membranes or skin necrosis: NO Has patient had a PCN reaction that required hospitalization. No  Has patient had a PCN reaction occurring within the last 10 years: No If all of the above answers are "NO", then may proceed with Cephalosporin use.    Tramadol Other (See Comments)   Auditory halllucinations        Medication List     STOP taking these medications    azithromycin 500 MG tablet Commonly known as: Zithromax   Eliquis 5 MG Tabs tablet Generic drug: apixaban       TAKE these medications    ACETAMINOPHEN PO Take 650 mg by mouth daily as needed for moderate pain or headache.   aspirin EC 81 MG tablet Take 1 tablet (81 mg total) by mouth daily. Swallow whole. Start taking on: December 14, 2022   atropine 1 % ophthalmic solution Place 1 drop into the right eye daily.   benzonatate 100 MG capsule Commonly known as: TESSALON Take 1 capsule (100 mg total) by mouth 3 (three) times daily.   cephALEXin 500 MG capsule Commonly known as: KEFLEX Take 1 capsule (500 mg total) by mouth every 8 (eight) hours for 2 days.   cholecalciferol 25 MCG (1000 UNIT) tablet Commonly known as: VITAMIN D3 Take 1,000 Units by mouth 2 (two) times daily.   CoQ10 100 MG Caps Take 100 mg by mouth daily.   ezetimibe 10 MG tablet Commonly known as: ZETIA TAKE 1 TABLET(10 MG) BY MOUTH DAILY What changed: See the new instructions.   famotidine 40 MG tablet Commonly known as: PEPCID Take 1 tablet (40 mg total) by mouth daily. Start taking on: December 14, 2022   ferrous sulfate 325 (65 FE) MG tablet Take 1 tablet (325 mg total) by mouth daily with breakfast. Start taking on: December 14, 2022   folic acid 1 MG tablet Commonly known as: FOLVITE Take 1 tablet (1 mg total) by mouth daily. Start taking on: December 14, 2022   furosemide 40 MG tablet Commonly known as: LASIX TAKE 1 TABLET(40 MG) BY MOUTH DAILY What  changed: See the new instructions.   guaiFENesin-dextromethorphan 100-10 MG/5ML syrup Commonly known as: ROBITUSSIN DM Take 5 mLs by mouth every 4 (four) hours as needed for cough.   levalbuterol 45 MCG/ACT inhaler Commonly known as: Xopenex HFA Inhale 1-2 puffs into the lungs every 6 (six) hours as needed for wheezing.   loteprednol 0.5 % ophthalmic suspension Commonly known as: LOTEMAX Place 1 drop into both eyes daily as needed (imflammation).   metoprolol succinate 50 MG 24 hr tablet Commonly known as: Toprol XL Take 1 tablet (50 mg total) by mouth in the morning and at bedtime.   multivitamin tablet Take 1 tablet by mouth 2 (  two) times a week.   nitroGLYCERIN 0.4 MG SL tablet Commonly known as: NITROSTAT Place 1 tablet (0.4 mg total) under the tongue every 5 (five) minutes as needed for chest pain.   predniSONE 10 MG tablet Commonly known as: DELTASONE Take PO 4 tabs daily x 3 days,3 tabs daily x 3 days,2 tabs daily x3 days,1 tab daily x 3 days then stop. Start taking on: December 14, 2022   PROBIOTIC DAILY PO Take 1 capsule by mouth daily.   rosuvastatin 20 MG tablet Commonly known as: CRESTOR Take 20 mg by mouth daily.   simethicone 125 MG chewable tablet Commonly known as: MYLICON Chew 125 mg by mouth at bedtime.   Spiriva Respimat 1.25 MCG/ACT Aers Generic drug: Tiotropium Bromide Monohydrate Inhale 2 puffs into the lungs daily.   Systane Balance 0.6 % Soln Generic drug: Propylene Glycol Place 1 drop into both eyes 2 (two) times daily as needed (dry eyes).        Follow-up Information     Omar Person, MD. Call in 1 week(s).   Specialty: Pulmonary Disease Why: Please call office to schedule appointment after hospitalization Contact information: [address] [place] [address] [phone]         Charlane Ferretti, DO Follow up in 1 week(s).   Specialty: Internal Medicine Contact information: [address] [place]  [address] [phone]                Allergies  Allergen Reactions   Codeine Nausea Only   Kenalog [Triamcinolone Acetonide] Other (See Comments)    Had A.fib after she received the injection."Steroid meds"   Kenalog [Triamcinolone] Rash    Other reaction(s): AFIB   Pantoprazole Rash    Other reaction(s): chronic upper GI pain all the way to back   Relafen [Nabumetone] Other (See Comments)    Edema    Epinephrine Palpitations   Penicillins Rash    40 years ago, injection site was red and swollen.  rash, facial/tongue/throat swelling, SOB or lightheadedness with hypotension: Yes Has patient had a PCN reaction causing immediate  Has patient had a PCN reaction causing severe rash involving mucus membranes or skin necrosis: NO Has patient had a PCN reaction that required hospitalization. No  Has patient had a PCN reaction occurring within the last 10 years: No If all of the above answers are "NO", then may proceed with Cephalosporin use.     Tramadol Other (See Comments)    Auditory halllucinations    The results of significant diagnostics from this hospitalization (including imaging, microbiology, ancillary and laboratory) are listed below for reference.    Microbiology: Recent Results (from the past 240 hour(s))  Resp panel by RT-PCR (RSV, Flu A&B, Covid) Anterior Nasal Swab     Status: Abnormal   Collection Time: 12/05/22 10:08 PM   Specimen: Anterior Nasal Swab  Result Value Ref Range Status   SARS Coronavirus 2 by RT PCR NEGATIVE NEGATIVE Final    Comment: (NOTE) SARS-CoV-2 target nucleic acids are NOT DETECTED.  The SARS-CoV-2 RNA is generally detectable in upper respiratory specimens during the acute phase of infection. The lowest concentration of SARS-CoV-2 viral copies this assay can detect is 138 copies/mL. A negative result does not preclude SARS-Cov-2 infection and should not be used as the sole basis for treatment or other patient management decisions. A  negative result may occur with  improper specimen collection/handling, submission of specimen other than nasopharyngeal swab, presence of viral mutation(s) within the  areas targeted by this assay, and inadequate number of viral copies(<138 copies/mL). A negative result must be combined with clinical observations, patient history, and epidemiological information. The expected result is Negative.  Fact Sheet for Patients:  EntrepreneurPulse.com.au  Fact Sheet for Healthcare Providers:  IncredibleEmployment.be  This test is no t yet approved or cleared by the [place] FDA and  has been authorized for detection and/or diagnosis of SARS-CoV-2 by FDA under an Emergency Use Authorization (EUA). This EUA will remain  in effect (meaning this test can be used) for the duration of the COVID-19 declaration under Section 564(b)(1) of the Act, 21 U.S.C.section 360bbb-3(b)(1), unless the authorization is terminated  or revoked sooner.       Influenza A by PCR NEGATIVE NEGATIVE Final   Influenza B by PCR NEGATIVE NEGATIVE Final    Comment: (NOTE) The Xpert Xpress SARS-CoV-2/FLU/RSV plus assay is intended as an aid in the diagnosis of influenza from Nasopharyngeal swab specimens and should not be used as a sole basis for treatment. Nasal washings and aspirates are unacceptable for Xpert Xpress SARS-CoV-2/FLU/RSV testing.  Fact Sheet for Patients: EntrepreneurPulse.com.au  Fact Sheet for Healthcare Providers: IncredibleEmployment.be  This test is not yet approved or cleared by the [place] FDA and has been authorized for detection and/or diagnosis of SARS-CoV-2 by FDA under an Emergency Use Authorization (EUA). This EUA will remain in effect (meaning this test can be used) for the duration of the COVID-19 declaration under Section 564(b)(1) of the Act, 21 U.S.C. section 360bbb-3(b)(1), unless the authorization  is terminated or revoked.     Resp Syncytial Virus by PCR POSITIVE (A) NEGATIVE Final    Comment: (NOTE) Fact Sheet for Patients: EntrepreneurPulse.com.au  Fact Sheet for Healthcare Providers: IncredibleEmployment.be  This test is not yet approved or cleared by the [place] FDA and has been authorized for detection and/or diagnosis of SARS-CoV-2 by FDA under an Emergency Use Authorization (EUA). This EUA will remain in effect (meaning this test can be used) for the duration of the COVID-19 declaration under Section 564(b)(1) of the Act, 21 U.S.C. section 360bbb-3(b)(1), unless the authorization is terminated or revoked.  Performed at [facility], [place] [address], [place], [place] [address]   Respiratory (~20 pathogens) panel by PCR     Status: Abnormal   Collection Time: 12/05/22 10:08 PM   Specimen: Anterior Nasal Swab; Respiratory  Result Value Ref Range Status   Adenovirus NOT DETECTED NOT DETECTED Final   Coronavirus 229E NOT DETECTED NOT DETECTED Final    Comment: (NOTE) The Coronavirus on the Respiratory Panel, DOES NOT test for the novel  Coronavirus (2019 nCoV)    Coronavirus HKU1 NOT DETECTED NOT DETECTED Final   Coronavirus NL63 NOT DETECTED NOT DETECTED Final   Coronavirus OC43 NOT DETECTED NOT DETECTED Final   Metapneumovirus NOT DETECTED NOT DETECTED Final   Rhinovirus / Enterovirus NOT DETECTED NOT DETECTED Final   Influenza A NOT DETECTED NOT DETECTED Final   Influenza B NOT DETECTED NOT DETECTED Final   Parainfluenza Virus 1 NOT DETECTED NOT DETECTED Final   Parainfluenza Virus 2 NOT DETECTED NOT DETECTED Final   Parainfluenza Virus 3 NOT DETECTED NOT DETECTED Final   Parainfluenza Virus 4 NOT DETECTED NOT DETECTED Final   Respiratory Syncytial Virus DETECTED (A) NOT DETECTED Final   Bordetella pertussis NOT DETECTED NOT DETECTED Final   Bordetella Parapertussis NOT DETECTED NOT DETECTED Final   Chlamydophila  pneumoniae NOT DETECTED NOT DETECTED Final   Mycoplasma pneumoniae NOT  DETECTED NOT DETECTED Final    Comment: Performed at [facility], [address], [place], [place] [address]  Urine Culture     Status: Abnormal   Collection Time: 12/06/22  1:09 AM   Specimen: Urine, Clean Catch  Result Value Ref Range Status   Specimen Description URINE, CLEAN CATCH  Final   Special Requests   Final    NONE Performed at [facility], [address], [place], [place] [address]    Culture MULTIPLE SPECIES PRESENT, SUGGEST RECOLLECTION (A)  Final   Report Status 12/07/2022 FINAL  Final  Expectorated Sputum Assessment w Gram Stain, Rflx to Resp Cult     Status: None (Preliminary result)   Collection Time: 12/06/22  5:35 AM   Specimen: Expectorated Sputum  Result Value Ref Range Status   Specimen Description EXPECTORATED SPUTUM  Final   Special Requests NONE  Final   Sputum evaluation   Final    Sputum specimen not acceptable for testing.  Please recollect.   NOTIFIED RN REDEN ON 12/06/22 @ 2247 BY DRT  PREVIOUSLY REPORTED AS: FEW WBC PRESENT,BOTH PMN AND MONONUCLEAR FEW GRAM POSITIVE COCCI FEW GRAM NEGATIVE RODS FEW GRAM POSITIVE RODS Performed at [facility], [address], [place], [place] [address]    Report Status PENDING  Incomplete  Urine Culture     Status: Abnormal   Collection Time: 12/11/22  7:50 PM   Specimen: Urine, Clean Catch  Result Value Ref Range Status   Specimen Description URINE, CLEAN CATCH  Final   Special Requests   Final    NONE Performed at [facility], [address] [place], [place] [address]    Culture 50,000 COLONIES/mL PROTEUS MIRABILIS (A)  Final   Report Status 12/13/2022 FINAL  Final   Organism ID, Bacteria PROTEUS MIRABILIS (A)  Final      Susceptibility   Proteus mirabilis - MIC*    AMPICILLIN <=2 SENSITIVE Sensitive     CEFAZOLIN <=4 SENSITIVE Sensitive     CEFEPIME <=0.12 SENSITIVE Sensitive     CEFTRIAXONE <=0.25  SENSITIVE Sensitive     CIPROFLOXACIN <=0.25 SENSITIVE Sensitive     GENTAMICIN <=1 SENSITIVE Sensitive     IMIPENEM 2 SENSITIVE Sensitive     NITROFURANTOIN 128 RESISTANT Resistant     TRIMETH/SULFA <=20 SENSITIVE Sensitive     AMPICILLIN/SULBACTAM <=2 SENSITIVE Sensitive     PIP/TAZO <=4 SENSITIVE Sensitive     * 50,000 COLONIES/mL PROTEUS MIRABILIS    Procedures/Studies: CT ABDOMEN PELVIS W CONTRAST  Result Date: 12/10/2022 CLINICAL DATA:  82 year old female with history of right lower quadrant abdominal pain. Evaluate for abdominal wall hematoma. EXAM: CT ABDOMEN AND PELVIS WITH CONTRAST TECHNIQUE: Multidetector CT imaging of the abdomen and pelvis was performed using the standard protocol following bolus administration of intravenous contrast. RADIATION DOSE REDUCTION: This exam was performed according to the departmental dose-optimization program which includes automated exposure control, adjustment of the mA and/or kV according to patient size and/or use of iterative reconstruction technique. CONTRAST:  103mL OMNIPAQUE IOHEXOL 350 MG/ML SOLN COMPARISON:  CT of the abdomen and pelvis 12/18/2020. FINDINGS: Lower chest: Scattered areas of cylindrical bronchiectasis, thickening of the peribronchovascular interstitium and patchy peribronchovascular ground-glass attenuation noted throughout the visualized lung bases, suggesting underlying interstitial lung disease. There is also a background of mild centrilobular and paraseptal emphysema. Atherosclerotic calcifications in the descending thoracic aorta as well as the left main, left anterior descending and left circumflex coronary arteries.  Severe calcifications of the mitral annulus. Status post TAVR. Hepatobiliary: Diffuse low attenuation throughout the hepatic parenchyma, indicative of a background of hepatic steatosis. No definite suspicious cystic or solid hepatic lesions are confidently identified. Status post cholecystectomy. Pancreas: No  pancreatic mass. No pancreatic ductal dilatation. No pancreatic or peripancreatic fluid collections or inflammatory changes. Spleen: Unremarkable. Adrenals/Urinary Tract: 2.3 cm low-attenuation lesion in the interpolar region of the left kidney is compatible with a simple (Bosniak class 1, no imaging follow-up recommended) cysts. Right kidney and left adrenal gland are normal in appearance. 2.2 cm right adrenal nodule, similar to prior studies, previously characterized as a benign adrenal adenoma (no imaging follow-up recommended). No hydroureteronephrosis. Urinary bladder is largely obscured by beam hardening artifact from the patient's bilateral hip arthroplasties. Stomach/Bowel: The appearance of the stomach is normal. There is no pathologic dilatation of small bowel or colon. Status post appendectomy. Vascular/Lymphatic: Atherosclerosis throughout the abdominal aorta and pelvic vasculature. No lymphadenopathy noted in the abdomen or pelvis. Reproductive: Uterus is atrophic with multiple densely calcified lesions, largest of which is in the fundal region measuring 2.6 cm in diameter, likely fibroids. Ovaries are atrophic. Other: No significant volume of ascites.  No pneumoperitoneum. Musculoskeletal: Throughout the right rectus abdominus musculature there is heterogeneous attenuation with multiple fluid fluid levels, likely to represent intramuscular hemorrhage, estimated to measure approximately 12.2 x 7.2 x 19.9 cm (axial image 69 of series 2 and sagittal image 63 of series 7). Status post bilateral hip arthroplasties. There are no aggressive appearing lytic or blastic lesions noted in the visualized portions of the skeleton. IMPRESSION: 1. Large intramuscular hemorrhage in the right rectus abdominus musculature, as above, estimated to measure approximately 12.2 x 7.2 x 19.9 cm. 2. The appearance of the lung bases suggests either interstitial lung disease or chronic atypical infection. Outpatient referral to  Pulmonology for further clinical evaluation and follow-up nonemergent outpatient high-resolution chest CT are suggested in the near future. 3. Aortic atherosclerosis, in addition to left main and 2 vessel coronary artery disease. Assessment for potential risk factor modification, dietary therapy or pharmacologic therapy may be warranted, if clinically indicated. 4. Hepatic steatosis. 5. Small right adrenal adenoma incidentally noted. 6. Additional incidental findings, as above. Electronically Signed   By: Trudie Reed M.D.   On: 12/10/2022 06:37   DG Abd 1 View  Result Date: 12/10/2022 CLINICAL DATA:  Abdominal pain EXAM: ABDOMEN - 1 VIEW COMPARISON:  12/18/2020 abdominal CT FINDINGS: The upper abdomen is not included in the field of view. No obstructive pattern of bowel gas. Coarse calcification the pelvis from fibroid. Moderate stool seen in the right abdomen. Cholecystectomy clips. Bilateral hip replacement. IMPRESSION: No indication of bowel obstruction.  Moderate stool. Electronically Signed   By: Tiburcio Pea M.D.   On: 12/10/2022 04:16   ECHOCARDIOGRAM COMPLETE  Result Date: 12/06/2022    ECHOCARDIOGRAM REPORT   Patient Name:   Victoria [place] Date of Exam: 12/06/2022 Medical Rec #:  [id number]     Height:       68.0 in Accession #:    [id number]    Weight:       278.0 lb Date of Birth:  31-May-1941    BSA:          2.351 m Patient Age:    81 years      BP:           109/62 mmHg Patient Gender: F  HR:           84 bpm. Exam Location:  Inpatient Procedure: 2D Echo, Cardiac Doppler and Color Doppler Indications:    Other abnormalities of the heart R00.8  History:        Patient has prior history of Echocardiogram examinations, most                 recent 11/04/2022. CAD and Angina, Arrythmias:Atrial Fibrillation                 and Tachycardia, Signs/Symptoms:Chest Pain and Shortness of                 Breath; Risk Factors:Hypertension, Sleep Apnea and Dyslipidemia.                 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 Procedure Date:                 05/14/22.  Sonographer:    Lucendia Herrlich Sonographer#2:  Irving Burton Senior Referring Phys: [id number] SARA-MAIZ A Koran IMPRESSIONS  1. Left ventricular ejection fraction, by estimation, is 70 to 75%. The left ventricle has hyperdynamic function. The left ventricle has no regional wall motion abnormalities. There is moderate left ventricular hypertrophy. Left ventricular diastolic parameters are indeterminate.  2. Resting peak LVOT gradient 84 mmHg  3. Right ventricular systolic function is normal. The right ventricular size is normal. Tricuspid regurgitation signal is inadequate for assessing PA pressure.  4. Left atrial size was mildly dilated.  5. A small pericardial effusion is present.  6. The mitral valve is degenerative. Trivial mitral valve regurgitation. Severe mitral stenosis. The mean mitral valve gradient is 16.0 mmHg with average heart rate of 82 bpm. Severe mitral annular calcification.  7. The aortic valve has been repaired/replaced. Aortic valve regurgitation is not visualized. 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 Procedure Date: 05/14/22. Vmax 3.2 m/s, MG 20 mmHg, EOA 2.2 cm^2, DI 0.58 FINDINGS  Left Ventricle: Left ventricular ejection fraction, by estimation, is 70 to 75%. The left ventricle has hyperdynamic function. The left ventricle has no regional wall motion abnormalities. The left ventricular internal cavity size was normal in size. There is moderate left ventricular hypertrophy. Left ventricular diastolic parameters are indeterminate. Right Ventricle: The right ventricular size is normal. No increase in right ventricular wall thickness. Right ventricular systolic function is normal. Tricuspid regurgitation signal is inadequate for assessing PA pressure. Left Atrium: Left atrial size was mildly dilated.  Right Atrium: Right atrial size was normal in size. Pericardium: A small pericardial effusion is present. Presence of epicardial fat layer. Mitral Valve: The mitral valve is degenerative in appearance. Severe mitral annular calcification. Trivial mitral valve regurgitation. Severe mitral valve stenosis. MV peak gradient, 30.6 mmHg. The mean mitral valve gradient is 16.0 mmHg with average heart rate of 82 bpm. Tricuspid Valve: The tricuspid valve is normal in structure. Tricuspid valve regurgitation is trivial. Aortic Valve: The aortic valve has been repaired/replaced. Aortic valve regurgitation is not visualized. Aortic valve mean gradient measures 20.0 mmHg. Aortic valve peak gradient measures 41.2 mmHg. Aortic valve area, by VTI measures 2.17 cm. 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 Procedure Date: 05/14/22. Pulmonic Valve: The pulmonic valve was not well visualized.  Pulmonic valve regurgitation is not visualized. Aorta: The aortic root and ascending aorta are structurally normal, with no evidence of dilitation. IAS/Shunts: The interatrial septum was not well visualized.  LEFT VENTRICLE PLAX 2D LVIDd:         4.30 cm   Diastology LVIDs:         2.40 cm   LV e' medial:   5.98 cm/s LV PW:         1.30 cm   LV E/e' medial: 27.1 LV IVS:        1.20 cm LVOT diam:     2.20 cm LV SV:         127 LV SV Index:   54 LVOT Area:     3.80 cm  RIGHT VENTRICLE RV S prime:     11.10 cm/s TAPSE (M-mode): 1.7 cm LEFT ATRIUM              Index        RIGHT ATRIUM           Index LA Vol (A2C):   120.0 ml 51.05 ml/m  RA Area:     18.00 cm LA Vol (A4C):   69.5 ml  29.56 ml/m  RA Volume:   42.60 ml  18.12 ml/m LA Biplane Vol: 93.8 ml  39.90 ml/m  AORTIC VALVE AV Area (Vmax):    3.69 cm AV Area (Vmean):   1.87 cm AV Area (VTI):     2.17 cm AV Vmax:           321.00 cm/s AV Vmean:          199.500 cm/s AV VTI:            0.586 m AV Peak Grad:      41.2 mmHg AV Mean  Grad:      20.0 mmHg LVOT Vmax:         312.00 cm/s LVOT Vmean:        98.300 cm/s LVOT VTI:          0.335 m LVOT/AV VTI ratio: 0.57  AORTA Ao Root diam: 2.80 cm Ao Asc diam:  3.50 cm MITRAL VALVE MV Area (PHT): 2.60 cm     SHUNTS MV Area VTI:   1.74 cm     Systemic VTI:  0.34 m MV Peak grad:  30.6 mmHg    Systemic Diam: 2.20 cm MV Mean grad:  16.0 mmHg MV Vmax:       2.77 m/s MV Vmean:      191.7 cm/s MV Decel Time: 292 msec MV E velocity: 162.00 cm/s MV A velocity: 193.00 cm/s MV E/A ratio:  0.84 Epifanio Lesches MD Electronically signed by Epifanio Lesches MD Signature Date/Time: 12/06/2022/12:12:53 PM    Final    DG Chest 1 View  Result Date: 12/05/2022 CLINICAL DATA:  Shortness of breath. EXAM: CHEST  1 VIEW COMPARISON:  Radiograph 11/24/2021 FINDINGS: Stable heart size and mediastinal contours. Peribronchial thickening. No focal airspace disease. No pleural effusion or pneumothorax. No acute osseous findings. IMPRESSION: Peribronchial thickening may be bronchitic or congestive. Electronically Signed   By: Narda Rutherford M.D.   On: 12/05/2022 22:57    Labs: BNP (last 3 results) Recent Labs    11/09/22 1414 12/05/22 2234  BNP 115.5* 446.5*   Basic Metabolic Panel: Recent Labs  Lab 12/09/22 0637 12/10/22 0111 12/11/22 0137 12/12/22 0056 12/13/22 0104  NA 140 134* 133* 133* 136  K 3.9 4.0 4.0 4.1 4.5  CL  101 100 97* 98 100  CO2 28 28 27 28 29   GLUCOSE 211* 271* 194* 191* 171*  BUN 20 23 35* 26* 23  CREATININE 0.72 0.83 0.96 0.89 0.88  CALCIUM 8.2* 8.3* 8.0* 8.2* 8.3*   Liver Function Tests: Recent Labs  Lab 12/07/22 0625  AST 40  ALT 21  ALKPHOS 63  BILITOT 0.9  PROT 6.5  ALBUMIN 3.3*   No results for input(s): "LIPASE", "AMYLASE" in the last 168 hours. No results for input(s): "AMMONIA" in the last 168 hours. CBC: Recent Labs  Lab 12/09/22 0113 12/10/22 0111 12/11/22 0137 12/12/22 0056 12/13/22 0104  WBC 12.4* 12.8* 18.5* 15.2* 11.6*  HGB 12.1 11.1*  9.5* 8.9* 8.3*  HCT 39.5 34.9* 30.4* 27.1* 26.0*  MCV 91.6 88.1 89.1 87.4 88.7  PLT 183 240 285 208 192   Cardiac Enzymes: No results for input(s): "CKTOTAL", "CKMB", "CKMBINDEX", "TROPONINI" in the last 168 hours. BNP: Invalid input(s): "POCBNP" CBG: Recent Labs  Lab 12/12/22 0605 12/12/22 1138 12/12/22 1537 12/12/22 2113 12/13/22 0650  GLUCAP 182* 180* 263* 196* 148*   D-Dimer No results for input(s): "DDIMER" in the last 72 hours. Hgb A1c No results for input(s): "HGBA1C" in the last 72 hours. Lipid Profile No results for input(s): "CHOL", "HDL", "LDLCALC", "TRIG", "CHOLHDL", "LDLDIRECT" in the last 72 hours. Thyroid function studies No results for input(s): "TSH", "T4TOTAL", "T3FREE", "THYROIDAB" in the last 72 hours.  Invalid input(s): "FREET3" Anemia work up No results for input(s): "VITAMINB12", "FOLATE", "FERRITIN", "TIBC", "IRON", "RETICCTPCT" in the last 72 hours. Urinalysis    Component Value Date/Time   COLORURINE YELLOW 12/11/2022 1950   APPEARANCEUR HAZY (A) 12/11/2022 1950   LABSPEC 1.018 12/11/2022 1950   PHURINE 5.0 12/11/2022 1950   GLUCOSEU NEGATIVE 12/11/2022 1950   HGBUR NEGATIVE 12/11/2022 1950   BILIRUBINUR NEGATIVE 12/11/2022 1950   BILIRUBINUR neg 01/25/2015 0920   KETONESUR NEGATIVE 12/11/2022 1950   PROTEINUR NEGATIVE 12/11/2022 1950   UROBILINOGEN negative 01/25/2015 0920   UROBILINOGEN 1.0 12/27/2014 0850   NITRITE NEGATIVE 12/11/2022 1950   LEUKOCYTESUR TRACE (A) 12/11/2022 1950   Sepsis Labs Recent Labs  Lab 12/10/22 0111 12/11/22 0137 12/12/22 0056 12/13/22 0104  WBC 12.8* 18.5* 15.2* 11.6*   Microbiology Recent Results (from the past 240 hour(s))  Resp panel by RT-PCR (RSV, Flu A&B, Covid) Anterior Nasal Swab     Status: Abnormal   Collection Time: 12/05/22 10:08 PM   Specimen: Anterior Nasal Swab  Result Value Ref Range Status   SARS Coronavirus 2 by RT PCR NEGATIVE NEGATIVE Final    Comment: (NOTE) SARS-CoV-2 target  nucleic acids are NOT DETECTED.  The SARS-CoV-2 RNA is generally detectable in upper respiratory specimens during the acute phase of infection. The lowest concentration of SARS-CoV-2 viral copies this assay can detect is 138 copies/mL. A negative result does not preclude SARS-Cov-2 infection and should not be used as the sole basis for treatment or other patient management decisions. A negative result may occur with  improper specimen collection/handling, submission of specimen other than nasopharyngeal swab, presence of viral mutation(s) within the areas targeted by this assay, and inadequate number of viral copies(<138 copies/mL). A negative result must be combined with clinical observations, patient history, and epidemiological information. The expected result is Negative.  Fact Sheet for Patients:  02/03/23  Fact Sheet for Healthcare Providers:  BloggerCourse.com  This test is no t yet approved or cleared by the [employer] FDA and  has been authorized for detection and/or diagnosis  of SARS-CoV-2 by FDA under an Emergency Use Authorization (EUA). This EUA will remain  in effect (meaning this test can be used) for the duration of the COVID-19 declaration under Section 564(b)(1) of the Act, 21 U.S.C.section 360bbb-3(b)(1), unless the authorization is terminated  or revoked sooner.       Influenza A by PCR NEGATIVE NEGATIVE Final   Influenza B by PCR NEGATIVE NEGATIVE Final    Comment: (NOTE) The Xpert Xpress SARS-CoV-2/FLU/RSV plus assay is intended as an aid in the diagnosis of influenza from Nasopharyngeal swab specimens and should not be used as a sole basis for treatment. Nasal washings and aspirates are unacceptable for Xpert Xpress SARS-CoV-2/FLU/RSV testing.  Fact Sheet for Patients: EntrepreneurPulse.com.au  Fact Sheet for Healthcare  Providers: IncredibleEmployment.be  This test is not yet approved or cleared by the [place] FDA and has been authorized for detection and/or diagnosis of SARS-CoV-2 by FDA under an Emergency Use Authorization (EUA). This EUA will remain in effect (meaning this test can be used) for the duration of the COVID-19 declaration under Section 564(b)(1) of the Act, 21 U.S.C. section 360bbb-3(b)(1), unless the authorization is terminated or revoked.     Resp Syncytial Virus by PCR POSITIVE (A) NEGATIVE Final    Comment: (NOTE) Fact Sheet for Patients: EntrepreneurPulse.com.au  Fact Sheet for Healthcare Providers: IncredibleEmployment.be  This test is not yet approved or cleared by the [place] FDA and has been authorized for detection and/or diagnosis of SARS-CoV-2 by FDA under an Emergency Use Authorization (EUA). This EUA will remain in effect (meaning this test can be used) for the duration of the COVID-19 declaration under Section 564(b)(1) of the Act, 21 U.S.C. section 360bbb-3(b)(1), unless the authorization is terminated or revoked.  Performed at [facility], [place] [address], [place], [place] [address]   Respiratory (~20 pathogens) panel by PCR     Status: Abnormal   Collection Time: 12/05/22 10:08 PM   Specimen: Anterior Nasal Swab; Respiratory  Result Value Ref Range Status   Adenovirus NOT DETECTED NOT DETECTED Final   Coronavirus 229E NOT DETECTED NOT DETECTED Final    Comment: (NOTE) The Coronavirus on the Respiratory Panel, DOES NOT test for the novel  Coronavirus (2019 nCoV)    Coronavirus HKU1 NOT DETECTED NOT DETECTED Final   Coronavirus NL63 NOT DETECTED NOT DETECTED Final   Coronavirus OC43 NOT DETECTED NOT DETECTED Final   Metapneumovirus NOT DETECTED NOT DETECTED Final   Rhinovirus / Enterovirus NOT DETECTED NOT DETECTED Final   Influenza A NOT DETECTED NOT DETECTED Final   Influenza B NOT  DETECTED NOT DETECTED Final   Parainfluenza Virus 1 NOT DETECTED NOT DETECTED Final   Parainfluenza Virus 2 NOT DETECTED NOT DETECTED Final   Parainfluenza Virus 3 NOT DETECTED NOT DETECTED Final   Parainfluenza Virus 4 NOT DETECTED NOT DETECTED Final   Respiratory Syncytial Virus DETECTED (A) NOT DETECTED Final   Bordetella pertussis NOT DETECTED NOT DETECTED Final   Bordetella Parapertussis NOT DETECTED NOT DETECTED Final   Chlamydophila pneumoniae NOT DETECTED NOT DETECTED Final   Mycoplasma pneumoniae NOT DETECTED NOT DETECTED Final    Comment: Performed at [facility], [place] [place], [place] [address]  Urine Culture     Status: Abnormal   Collection Time: 12/06/22  1:09 AM   Specimen: Urine, Clean Catch  Result Value Ref Range Status   Specimen Description URINE, CLEAN CATCH  Final   Special Requests   Final    NONE  Performed at [facility], [address] [place], [place] [address]    Culture MULTIPLE SPECIES PRESENT, SUGGEST RECOLLECTION (A)  Final   Report Status 12/07/2022 FINAL  Final  Expectorated Sputum Assessment w Gram Stain, Rflx to Resp Cult     Status: None (Preliminary result)   Collection Time: 12/06/22  5:35 AM   Specimen: Expectorated Sputum  Result Value Ref Range Status   Specimen Description EXPECTORATED SPUTUM  Final   Special Requests NONE  Final   Sputum evaluation   Final    Sputum specimen not acceptable for testing.  Please recollect.   NOTIFIED RN REDEN ON 12/06/22 @ 2247 BY DRT  PREVIOUSLY REPORTED AS: FEW WBC PRESENT,BOTH PMN AND MONONUCLEAR FEW GRAM POSITIVE COCCI FEW GRAM NEGATIVE RODS FEW GRAM POSITIVE RODS Performed at [facility], [address] [place], [place] [address]    Report Status PENDING  Incomplete  Urine Culture     Status: Abnormal   Collection Time: 12/11/22  7:50 PM   Specimen: Urine, Clean Catch  Result Value Ref Range Status   Specimen Description URINE, CLEAN CATCH  Final   Special  Requests   Final    NONE Performed at [facility], [address] [place], [place] [address]    Culture 50,000 COLONIES/mL PROTEUS MIRABILIS (A)  Final   Report Status 12/13/2022 FINAL  Final   Organism ID, Bacteria PROTEUS MIRABILIS (A)  Final      Susceptibility   Proteus mirabilis - MIC*    AMPICILLIN <=2 SENSITIVE Sensitive     CEFAZOLIN <=4 SENSITIVE Sensitive     CEFEPIME <=0.12 SENSITIVE Sensitive     CEFTRIAXONE <=0.25 SENSITIVE Sensitive     CIPROFLOXACIN <=0.25 SENSITIVE Sensitive     GENTAMICIN <=1 SENSITIVE Sensitive     IMIPENEM 2 SENSITIVE Sensitive     NITROFURANTOIN 128 RESISTANT Resistant     TRIMETH/SULFA <=20 SENSITIVE Sensitive     AMPICILLIN/SULBACTAM <=2 SENSITIVE Sensitive     PIP/TAZO <=4 SENSITIVE Sensitive     * 50,000 COLONIES/mL PROTEUS MIRABILIS   Time coordinating discharge: 35 minutes  SIGNED: Lanae Boast, MD  [facility] 12/13/2022, 11:13 AM  If 7PM-7AM, please contact night-coverage [email or web address]

## 2022-12-31 ENCOUNTER — Ambulatory Visit: Payer: Medicare Other | Admitting: Nurse Practitioner

## 2023-01-04 ENCOUNTER — Encounter: Payer: Self-pay | Admitting: Cardiology

## 2023-01-15 ENCOUNTER — Encounter: Payer: Self-pay | Admitting: Student

## 2023-01-15 ENCOUNTER — Telehealth: Payer: Self-pay | Admitting: Student

## 2023-01-15 ENCOUNTER — Ambulatory Visit (INDEPENDENT_AMBULATORY_CARE_PROVIDER_SITE_OTHER): Payer: Medicare Other

## 2023-01-15 ENCOUNTER — Ambulatory Visit: Payer: Medicare Other | Admitting: Student

## 2023-01-15 VITALS — BP 148/74 | HR 101 | Temp 98.4°F | Ht 68.0 in | Wt 268.6 lb

## 2023-01-15 DIAGNOSIS — R0600 Dyspnea, unspecified: Secondary | ICD-10-CM | POA: Diagnosis not present

## 2023-01-15 DIAGNOSIS — J438 Other emphysema: Secondary | ICD-10-CM | POA: Diagnosis not present

## 2023-01-15 LAB — CBC WITH DIFFERENTIAL/PLATELET
Basophils Absolute: 0.1 10*3/uL (ref 0.0–0.1)
Basophils Relative: 0.6 % (ref 0.0–3.0)
Eosinophils Absolute: 0.1 10*3/uL (ref 0.0–0.7)
Eosinophils Relative: 1.6 % (ref 0.0–5.0)
HCT: 38.3 % (ref 36.0–46.0)
Hemoglobin: 12.6 g/dL (ref 12.0–15.0)
Lymphocytes Relative: 27.2 % (ref 12.0–46.0)
Lymphs Abs: 2.4 10*3/uL (ref 0.7–4.0)
MCHC: 33 g/dL (ref 30.0–36.0)
MCV: 92.6 fl (ref 78.0–100.0)
Monocytes Absolute: 0.8 10*3/uL (ref 0.1–1.0)
Monocytes Relative: 8.6 % (ref 3.0–12.0)
Neutro Abs: 5.4 10*3/uL (ref 1.4–7.7)
Neutrophils Relative %: 62 % (ref 43.0–77.0)
Platelets: 350 10*3/uL (ref 150.0–400.0)
RBC: 4.14 Mil/uL (ref 3.87–5.11)
RDW: 20.9 % — ABNORMAL HIGH (ref 11.5–15.5)
WBC: 8.8 10*3/uL (ref 4.0–10.5)

## 2023-01-15 LAB — BRAIN NATRIURETIC PEPTIDE: Pro B Natriuretic peptide (BNP): 302 pg/mL — ABNORMAL HIGH (ref 0.0–100.0)

## 2023-01-15 NOTE — Telephone Encounter (Signed)
What is least expensive ICS inhaler for her?  Thanks! 

## 2023-01-15 NOTE — Patient Instructions (Signed)
-   CXR, labs today - I will price out inhaled steroids today  - Continue spiriva 1.25 2 puffs daily - keep going with iron, ensuring you're having regular bowel movements (ok to use miralax as needed to help with this) - xopenex 1-2 puffs as needed is your rescue inhaler - I think it would be ok to give this a shot but call us if you have issues with heart rate afterward - see you in 3 months or sooner if need be!

## 2023-01-15 NOTE — Progress Notes (Signed)
Synopsis: Referred for dyspnea by Sueanne Margarita, DO  Subjective:   PATIENT ID: Victoria Delgado GENDER: female DOB: [birth year], MRN: [id number]  Chief Complaint  Patient presents with   Follow-up    SOB much worse, with exertion.  RSV 12/05/22.   80yF with history of CAD, AF, asymmetric septal hypertrophy, GERD/hiatal hernia, OSA on CPAP - followed by Dr. Claiborne [facility], moderate MR, AS s/p TAVR, former smoker, covid-19 infection referred for dyspnea  She has been in AF since she had covid-19. She herself had some reservations about using amio due to her breathing issues and she has actually come off of it in mid-June. Some days are worse than others with her DOE. Can't pinpoint trigger. She has never tried albuterol inhaler due to her concern about worsening her afib. She hasn't had a recent course of steroids for DOE.   Also has occasional cough productive of clearish sputum, worse in AM.   She smoked 1-1.5ppd for 20 years. Quit 1990.   Interval HPI: Started with spiriva 1 puff once daily, increased to 2 puffs once daily. She does think it has probably been helpful for DOE. Has not tried rescue inhaler. Minor cough currently, better than at last visit.   Has not needed course of steroids or ABX since last visit. --------------------------  She was hospitalized at [facility] for RSV with AECOPD and course c/b AF/RVR. Her hemoglobin has decreased to 8.9 from 14 since last visit in setting of rectus sheath hematoma (had NSTEMI with plan originally for [facility]).  She has completed steroid taper, ABX. Still taking spiriva 2 puffs once daily. Has not used her rescue inhaler at all due to fear of running into issues from AF/RVR.   Still profoundly dyspneic since hospitalization, thick whitish sputum production. No fever.   Otherwise pertinent review of systems is negative.  Past Medical History:  Diagnosis Date   Abdominal pain    Arthritis    osteoarthritis-hips,knees, back, hands   Asymmetric septal  hypertrophy (HCC)    Atrial fibrillation (HCC)    CAD (coronary artery disease)    2 drug-eluting stents placed in the circumflex leading into a marginal.  May 2017.  [name].   catheterization on 03/19/2018, this showed patent stent in the proximal to mid left circumflex artery, 20% ostial left circumflex artery disease, 40% OM 3 disease, widely patent stent in the ostial D1, 20% ostial to proximal LAD disease, 20% mid LAD disease.   Cataract    corrective surgery done   Fatty liver    Gallstones    GERD (gastroesophageal reflux disease)    Hepatitis    hepatitis A; past hx.,many yrs ago ? food source   Hernia, hiatal    Hyperlipidemia    Hypertension    Obesity    Sleep apnea    CPAP     Family History  Problem Relation Age of Onset   Hypertension Mother    CVA Mother    Emphysema Mother        smoked   Lung cancer Father 65       asbestos exposure   Cancer - Lung Father        smoked and asbestos exp   Emphysema Sister        smoked   Esophageal cancer Other        nephew   Diabetes Other        nephew   Colon cancer Neg Hx    Pancreatic cancer Neg Hx  Kidney disease Neg Hx    Liver disease Neg Hx      Past Surgical History:  Procedure Laterality Date   APPENDECTOMY  2004   CARDIOVERSION N/A 01/03/2022   Procedure: CARDIOVERSION;  Surgeon: Geralynn Rile, MD;  Location: [place];  Service: Cardiovascular;  Laterality: N/A;   CATARACT EXTRACTION W/ INTRAOCULAR LENS IMPLANT     both eyes   CHOLECYSTECTOMY     LEFT HEART CATH AND CORONARY ANGIOGRAPHY N/A 03/19/2018   Procedure: LEFT HEART CATH AND CORONARY ANGIOGRAPHY;  Surgeon: Burnell Blanks, MD;  Location: [facility];  Service: Cardiovascular;  Laterality: N/A;   NM MYOCAR PERF WALL MOTION  08/15/04   No ischemia   RETINAL DETACHMENT SURGERY Bilateral    remains with slight hazy vision with lights   RIGHT/LEFT HEART CATH AND CORONARY ANGIOGRAPHY N/A 07/11/2020   Procedure:  RIGHT/LEFT HEART CATH AND CORONARY ANGIOGRAPHY;  Surgeon: Sherren Mocha, MD;  Location: [place] [facility];  Service: Cardiovascular;  Laterality: N/A;   TOTAL HIP ARTHROPLASTY Right 08/04/2014   Procedure: RIGHT TOTAL HIP ARTHROPLASTY ANTERIOR APPROACH;  Surgeon: Gearlean Alf, MD;  Location: [facility];  Service: Orthopedics;  Laterality: Right;   TOTAL HIP ARTHROPLASTY Left 01/05/2015   Procedure: LEFT TOTAL HIP ARTHROPLASTY ANTERIOR APPROACH;  Surgeon: Gearlean Alf, MD;  Location: [facility];  Service: Orthopedics;  Laterality: Left;   US ECHOCARDIOGRAPHY  06/25/2012   Moderate ASH,LV hyperdynamic,LA is mod. dilated,trace MR,TR,AI    Social History   Socioeconomic History   Marital status: Married    Spouse name: Not on file   Number of children: 2   Years of education: Not on file   Highest education level: Not on file  Occupational History    Employer: OTHER  Tobacco Use   Smoking status: Former    Packs/day: 1.00    Years: 20.00    Total pack years: 20.00    Types: Cigarettes    Quit date: 12/03/1988    Years since quitting: 34.1   Smokeless tobacco: Never  Vaping Use   Vaping Use: Never used  Substance and Sexual Activity   Alcohol use: No    Alcohol/week: 0.0 standard drinks of alcohol   Drug use: No   Sexual activity: Yes  Other Topics Concern   Not on file  Social History Narrative   Two grandchildren.            Social Determinants of Health   Financial Resource Strain: Not on file  Food Insecurity: No Food Insecurity (12/08/2022)   Hunger Vital Sign    Worried About Running Out of Food in the Last Year: Never true    Ran Out of Food in the Last Year: Never true  Transportation Needs: No Transportation Needs (12/08/2022)   PRAPARE - Hydrologist (Medical): No    Lack of Transportation (Non-Medical): No  Physical Activity: Not on file  Stress: Not on file  Social Connections: Not on file  Intimate Partner Violence: Not At Risk  (12/08/2022)   Humiliation, Afraid, Rape, and Kick questionnaire    Fear of Current or Ex-Partner: No    Emotionally Abused: No    Physically Abused: No    Sexually Abused: No     Allergies  Allergen Reactions   Codeine Nausea Only   Kenalog [Triamcinolone Acetonide] Other (See Comments)    Had A.fib after she received the injection."Steroid meds"   Kenalog [Triamcinolone] Rash  Other reaction(s): AFIB   Pantoprazole Rash    Other reaction(s): chronic upper GI pain all the way to back   Relafen [Nabumetone] Other (See Comments)    Edema    Albuterol Other (See Comments)    Causes SVT   Epinephrine Palpitations   Penicillins Rash    40 years ago, injection site was red and swollen.  rash, facial/tongue/throat swelling, SOB or lightheadedness with hypotension: Yes Has patient had a PCN reaction causing immediate  Has patient had a PCN reaction causing severe rash involving mucus membranes or skin necrosis: NO Has patient had a PCN reaction that required hospitalization. No  Has patient had a PCN reaction occurring within the last 10 years: No If all of the above answers are "NO", then may proceed with Cephalosporin use.     Tramadol Other (See Comments)    Auditory halllucinations     Outpatient Medications Prior to Visit  Medication Sig Dispense Refill   ACETAMINOPHEN PO Take 650 mg by mouth daily as needed for moderate pain or headache.     atropine 1 % ophthalmic solution Place 1 drop into the right eye daily.     cholecalciferol (VITAMIN D3) 25 MCG (1000 UNIT) tablet Take 1,000 Units by mouth 2 (two) times daily.     Coenzyme Q10 (COQ10) 100 MG CAPS Take 100 mg by mouth daily.     ezetimibe (ZETIA) 10 MG tablet TAKE 1 TABLET(10 MG) BY MOUTH DAILY (Patient taking differently: Take 10 mg by mouth daily.) 90 tablet 1   furosemide (LASIX) 40 MG tablet TAKE 1 TABLET(40 MG) BY MOUTH DAILY (Patient taking differently: Take 40 mg by mouth daily.) 90 tablet 3    guaiFENesin-dextromethorphan (ROBITUSSIN DM) 100-10 MG/5ML syrup Take 5 mLs by mouth every 4 (four) hours as needed for cough. 118 mL 0   levalbuterol (XOPENEX HFA) 45 MCG/ACT inhaler Inhale 1-2 puffs into the lungs every 6 (six) hours as needed for wheezing. 1 each 12   loteprednol (LOTEMAX) 0.5 % ophthalmic suspension Place 1 drop into both eyes daily as needed (imflammation).     metoprolol succinate (TOPROL XL) 50 MG 24 hr tablet Take 1 tablet (50 mg total) by mouth in the morning and at bedtime. 180 tablet 3   Multiple Vitamin (MULTIVITAMIN) tablet Take 1 tablet by mouth 2 (two) times a week.     nitroGLYCERIN (NITROSTAT) 0.4 MG SL tablet Place 1 tablet (0.4 mg total) under the tongue every 5 (five) minutes as needed for chest pain. 25 tablet 3   Probiotic Product (PROBIOTIC DAILY PO) Take 1 capsule by mouth daily.     Propylene Glycol (SYSTANE BALANCE) 0.6 % SOLN Place 1 drop into both eyes 2 (two) times daily as needed (dry eyes).     rosuvastatin (CRESTOR) 20 MG tablet Take 20 mg by mouth daily.     simethicone (MYLICON) 0000000 MG chewable tablet Chew 125 mg by mouth at bedtime.     Tiotropium Bromide Monohydrate (SPIRIVA RESPIMAT) 1.25 MCG/ACT AERS Inhale 2 puffs into the lungs daily. 4 g 5   benzonatate (TESSALON) 100 MG capsule Take 1 capsule (100 mg total) by mouth 3 (three) times daily. (Patient not taking: Reported on 01/15/2023) 20 capsule 0   famotidine (PEPCID) 40 MG tablet Take 1 tablet (40 mg total) by mouth daily. 30 tablet 0   ferrous sulfate 325 (65 FE) MG tablet Take 1 tablet (325 mg total) by mouth daily with breakfast. 30 tablet 0   predniSONE (DELTASONE)  10 MG tablet Take PO 4 tabs daily x 3 days,3 tabs daily x 3 days,2 tabs daily x3 days,1 tab daily x 3 days then stop. (Patient not taking: Reported on 01/15/2023) 30 tablet 0   No facility-administered medications prior to visit.       Objective:   Physical Exam:  General appearance: 82 y.o., female, NAD, conversant   Eyes: anicteric sclerae; PERRL, tracking appropriately HENT: NCAT; MMM Neck: Trachea midline; no lymphadenopathy, no JVD Lungs: CTAB, no crackles, no wheeze, with normal respiratory effort CV: RRR, +diastolic murmur Abdomen: Soft, non-tender; non-distended, BS present  Extremities: No peripheral edema, warm Skin: Normal turgor and texture; no rash Psych: Appropriate affect Neuro: Alert and oriented to person and place, no focal deficit     Vitals:   01/15/23 1309  BP: (!) 148/74  Pulse: (!) 101  Temp: 98.4 F (36.9 C)  TempSrc: Oral  SpO2: 93%  Weight: 268 lb 9.6 oz (121.8 kg)  Height: 5' 8"$  (1.727 m)    93% on RA BMI Readings from Last 3 Encounters:  01/15/23 40.84 kg/m  12/13/22 41.47 kg/m  11/09/22 42.27 kg/m   Wt Readings from Last 3 Encounters:  01/15/23 268 lb 9.6 oz (121.8 kg)  12/13/22 272 lb 11.3 oz (123.7 kg)  11/09/22 278 lb (126.1 kg)     CBC    Component Value Date/Time   WBC 11.6 (H) 12/13/2022 0104   RBC 2.93 (L) 12/13/2022 0104   HGB 8.9 (L) 12/13/2022 1218   HGB 14.7 07/07/2020 1112   HCT 27.3 (L) 12/13/2022 1218   HCT 44.4 07/07/2020 1112   PLT 192 12/13/2022 0104   PLT 234 07/07/2020 1112   MCV 88.7 12/13/2022 0104   MCV 89 07/07/2020 1112   MCH 28.3 12/13/2022 0104   MCHC 31.9 12/13/2022 0104   RDW 17.7 (H) 12/13/2022 0104   RDW 15.5 (H) 07/07/2020 1112   LYMPHSABS 0.3 (L) 12/06/2022 0810   MONOABS 0.0 (L) 12/06/2022 0810   EOSABS 0.0 12/06/2022 0810   BASOSABS 0.0 12/06/2022 0810     Chest Imaging: 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, mosaicism, LLL scarring  Pulmonary Functions Testing Results:    Latest Ref Rng & Units 04/27/2022    1:35 PM 11/06/2019    8:19 AM  PFT Results  FVC-Pre L 2.09  2.31   FVC-Predicted Pre % 65  70   FVC-Post L 2.18  2.48   FVC-Predicted Post % 68  75   Pre FEV1/FVC % % 74  79   Post FEV1/FCV % % 78  77   FEV1-Pre L 1.55  1.82   FEV1-Predicted Pre % 64  73   FEV1-Post L 1.69   1.91   DLCO uncorrected ml/min/mmHg 14.05  20.26   DLCO UNC% % 65  93   DLVA Predicted % 85  112   TLC L 5.26  5.51   TLC % Predicted % 93  97   RV % Predicted % 115  112    PFT 04/27/22 reviewed by me with nonspecific ventilatory defect, mildly reduced diffusing capacity    Echocardiogram 12/06/22:   1. Left ventricular ejection fraction, by estimation, is 70 to 75%. The  left ventricle has hyperdynamic function. The left ventricle has no  regional wall motion abnormalities. There is moderate left ventricular  hypertrophy. Left ventricular diastolic  parameters are indeterminate.   2. Resting peak LVOT gradient 84 mmHg   3. Right ventricular systolic function is normal. The  right ventricular  size is normal. Tricuspid regurgitation signal is inadequate for assessing  PA pressure.   4. Left atrial size was mildly dilated.   5. A small pericardial effusion is present.   6. The mitral valve is degenerative. Trivial mitral valve regurgitation.  Severe mitral stenosis. The mean mitral valve gradient is 16.0 mmHg with  average heart rate of 82 bpm. Severe mitral annular calcification.   7. The aortic valve has been repaired/replaced. Aortic valve  regurgitation is not visualized. There is a 23 mm Edwards Sapien  prosthetic (TAVR) valve present in the aortic position. Procedure Date:  05/14/22. 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 Although she doesn't have obstruction on PFT and her air trapping on HRCT could just be due to obesity, she does have emphysema and it's probably worth a trial of a controller inhaler. Her pattern of emphysema is interesting and almost looks like LAM  but is probably just due to smoking and with her relatively limited symptoms I'm not sure how much value testing for VEGF-D would confer over and above a good controller inhaler. I don't see any clear evidence of amiodarone related lung injury. She remains apprehensive about any inhaler that may worsen her control of AF but has tolerated low dose LAMA well to decent effect wrt cough, dyspnea, tolerated xopenex in the hospital.  # Acute dyspnea Doesn't sound like she's in exacerbation on exam today though recently had AECOPD due to RSV. Her anemia with significant decline in Hb is probably playing some role. Hasn't had remarkable weight gain and doesn't appear remarkably volume overloaded on exam to suggest decompensated MS/CHF. This does not feel like her anginal equivalent (L arm pain/numbness).   Plan: - CXR, BNP, CBC/diff - I will price out inhaled steroids today  - Continue spiriva 1.25 2 puffs daily - keep going with iron, ensuring you're having regular bowel movements (ok to use miralax as needed to help with this) - xopenex 1-2 puffs as needed is your rescue inhaler - I think it would be ok to give this a shot but call us if you have issues with heart rate afterward - see you in 6 weeks or sooner if need be!     Maryjane Hurter, MD [facility] 01/15/2023 1:13 PM

## 2023-01-16 ENCOUNTER — Other Ambulatory Visit (HOSPITAL_COMMUNITY): Payer: Self-pay

## 2023-01-16 NOTE — Telephone Encounter (Signed)
Per benefits investigation the least expensive options for ICS at this time are the [place] and the [place] for $47.00

## 2023-01-17 ENCOUNTER — Encounter: Payer: Self-pay | Admitting: Student

## 2023-01-18 ENCOUNTER — Encounter: Payer: Self-pay | Admitting: Cardiology

## 2023-01-18 MED ORDER — BECLOMETHASONE DIPROP HFA 80 MCG/ACT IN AERB: 2.0000 | INHALATION_SPRAY | Freq: Two times a day (BID) | RESPIRATORY_TRACT | 11 refills | Status: DC

## 2023-01-18 NOTE — Telephone Encounter (Signed)
Received message from patient asking about the status of her inhaler. I reviewed her chart and looks like the pharmacy team was able to find 2 inhalers for her. Below is the info from the pharmacy:    Per benefits investigation the least expensive options for ICS at this time are the [employer] [facility] and the [place] for $47.00     Dr. Verlee Monte, can you please advise? Thanks!

## 2023-01-30 ENCOUNTER — Other Ambulatory Visit: Payer: Self-pay | Admitting: Cardiology

## 2023-02-04 IMAGING — US US THYROID
1 series · 13 of 25 positions shown · non-contrast
Comparison: 03/29/2020, 04/07/2020

CLINICAL DATA: Dominant left thyroid nodule, previous biopsy
04/07/2020

EXAM:
THYROID ULTRASOUND
TECHNIQUE: Ultrasound examination of the thyroid gland and adjacent soft
tissues was performed.

[Series 1: us thyroid · 0.08mm/px · 13 of 60 slices shown]
[im 1/60]
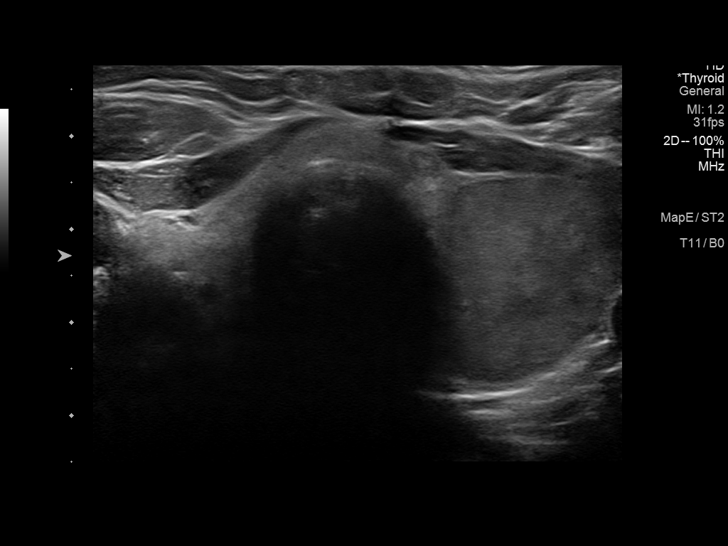
[im 5/60]
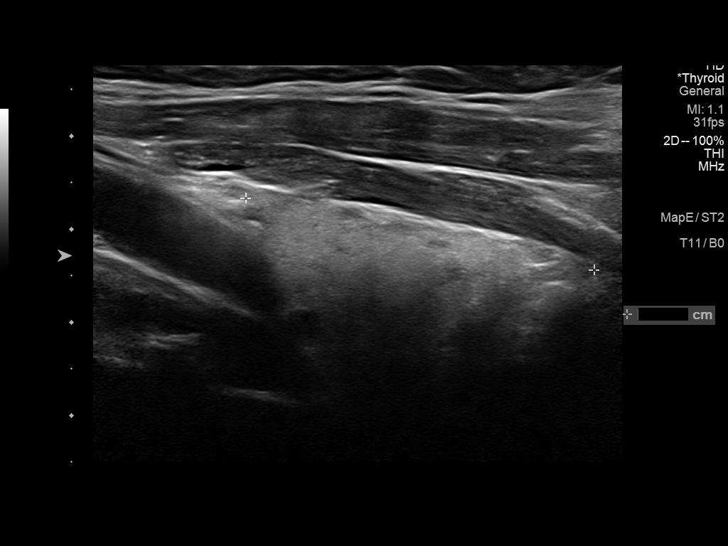
[im 10/60]
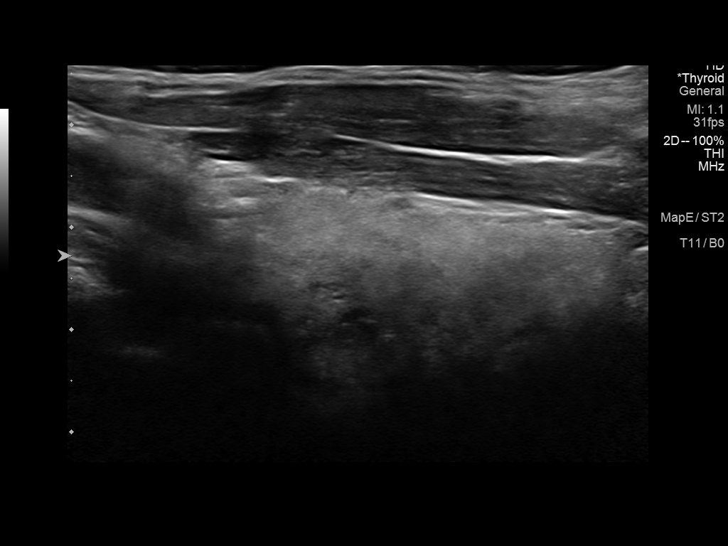
[im 15/60]
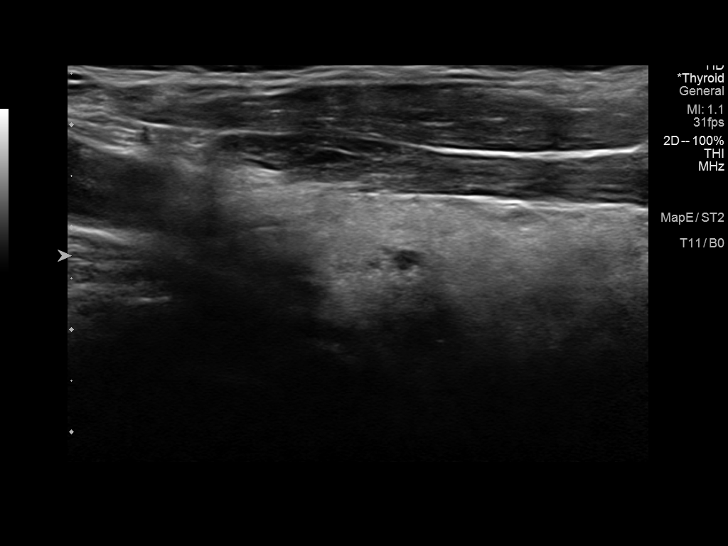
[im 20/60]
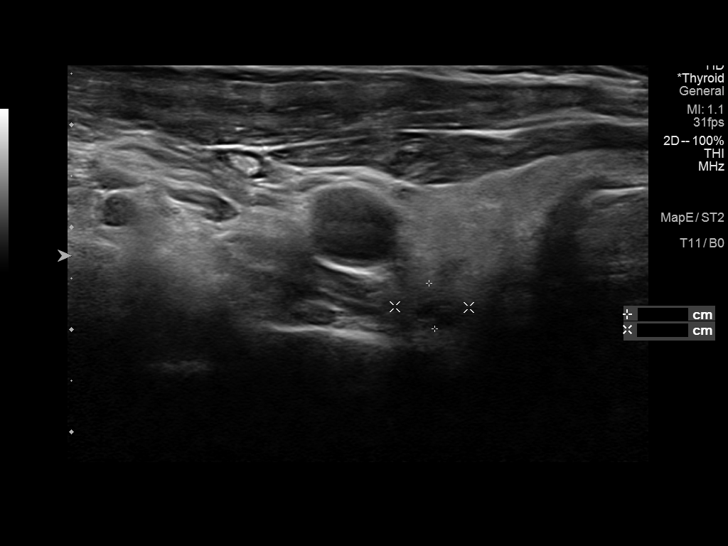
[im 25/60]
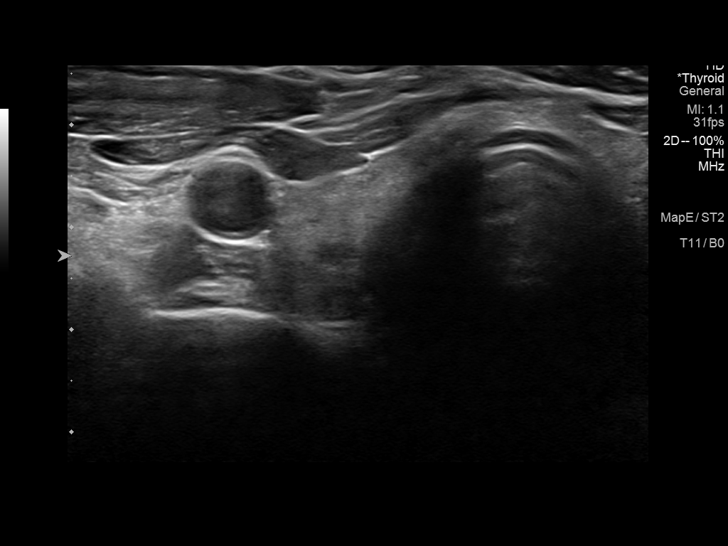
[im 30/60]
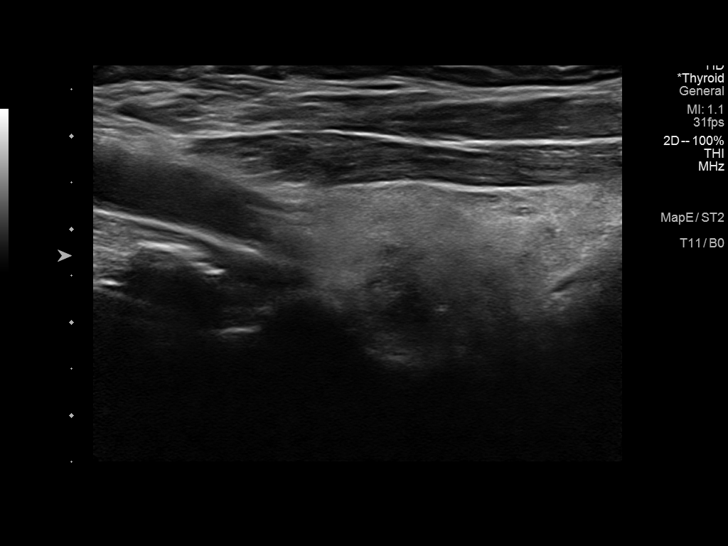
[im 35/60]
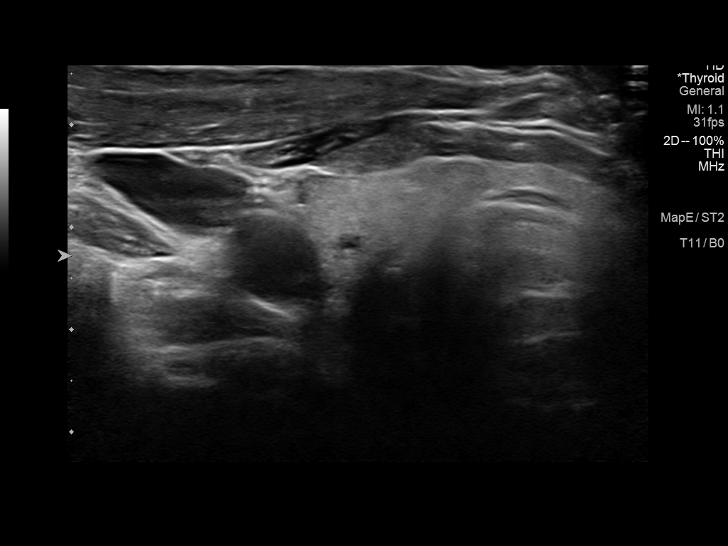
[im 40/60]
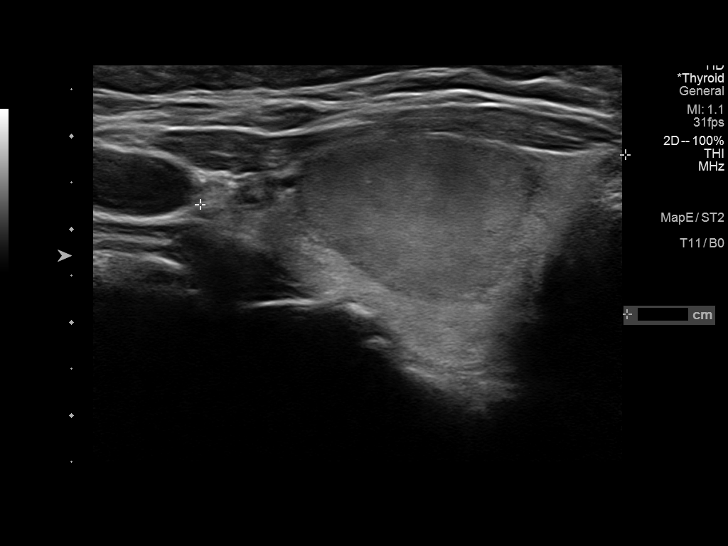
[im 45/60]
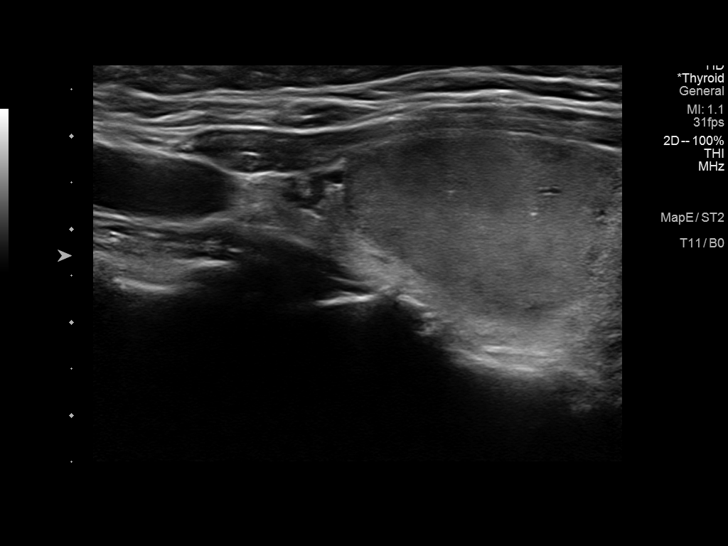
[im 50/60]
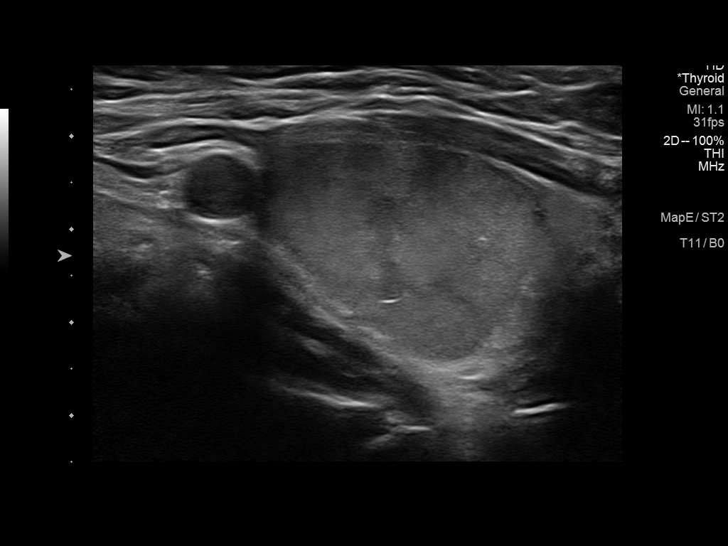
[im 55/60]
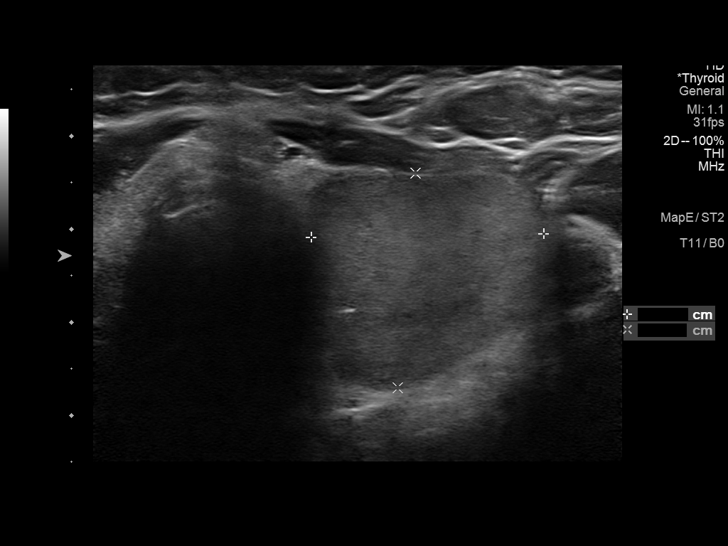
[im 60/60]
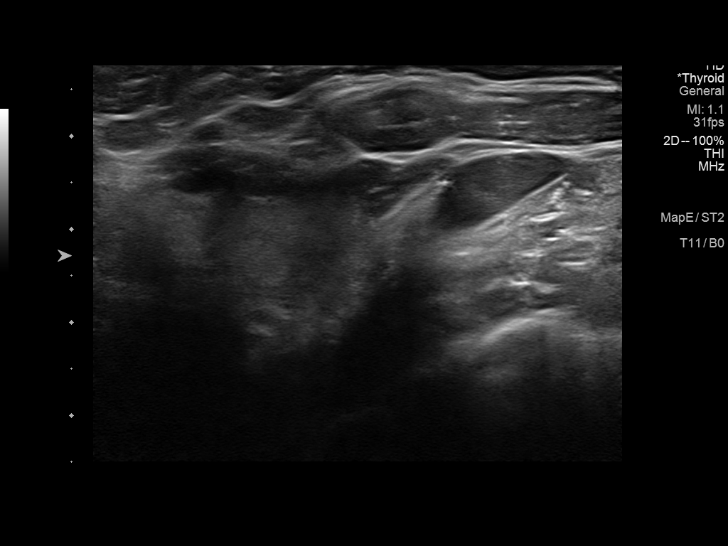

[13 of 25 positions shown; findings below may reference images not displayed]

FINDINGS: Parenchymal Echotexture: Mildly heterogenous

Isthmus: 5 mm

Right lobe: 3.8 x 1.5 x 1.6 cm

Left lobe: 4.6 x 2.7 x 3.0 cm

_________________________________________________________

Estimated total number of nodules >/= 1 cm: 2

Number of spongiform nodules >/=  2 cm not described below (TR1): 0

Number of mixed cystic and solid nodules >/= 1.5 cm not described
below (TR2): 0

_________________________________________________________

Nodule # 1:

Location: Right; Inferior

Maximum size: 1.2 cm; Other 2 dimensions: 0.8 x 1.2 cm

Composition: solid/almost completely solid (2)

Echogenicity: hypoechoic (2)

Shape: not taller-than-wide (0)

Margins: ill-defined (0)

Echogenic foci: none (0)

ACR TI-RADS total points: 4.

ACR TI-RADS risk category: TR4 (4-6 points).

ACR TI-RADS recommendations:

*Given size (>/= 1 - 1.4 cm) and appearance, a follow-up ultrasound
in 1 year should be considered based on TI-RADS criteria.

_________________________________________________________

The previously biopsied left mid thyroid TR 5 nodule measures 3.1 x
2.3 x 2.5 cm, previously 3.0 x 1.9 x 2.1 cm. No significant interval
change. Very similar appearance by ultrasound. Correlate with prior
pathology.

Stable minor thyroid heterogeneity. No hypervascularity or regional
adenopathy.
IMPRESSION: Stable 3 cm left mid thyroid TR 5 nodule, previously biopsied.
Correlate with prior pathology.

1.2 cm right inferior TR 4 nodule meets criteria follow-up in 1
year.

The above is in keeping with the ACR TI-RADS recommendations - [HOSPITAL] 4673;[DATE].

## 2023-02-05 ENCOUNTER — Encounter: Payer: Self-pay | Admitting: Student

## 2023-02-05 MED ORDER — NYSTATIN 100000 UNIT/ML MT SUSP: 5.0000 mL | Freq: Four times a day (QID) | OROMUCOSAL | 0 refills | Status: DC

## 2023-02-05 NOTE — Telephone Encounter (Signed)
Dr. Verlee Monte, please see pts message regarding thrush and wanting antifungal. Thanks.

## 2023-02-14 ENCOUNTER — Other Ambulatory Visit: Payer: Self-pay | Admitting: Internal Medicine

## 2023-02-14 NOTE — Progress Notes (Signed)
Cardiology Office Note   Date:  02/15/2023   ID:  Victoria Delgado, DOB [birth year], MRN [id number]  PCP:  Sueanne Margarita, DO  Cardiologist:   Minus Breeding, MD   Chief Complaint  Patient presents with   Shortness of Breath      History of Present Illness: Victoria Delgado is a 82 y.o. female who presents for follow up of CAD.  She has atrial fibrillation which she ultimately thinks was related to a steroid injection. This was in 2008. At that time she was found to have asymmetric septal hypertrophy. This has been followed conservatively.  Echocardiogram did demonstrate concentric LV hypertrophy which was moderate and moderate LAE.  In May of 2017 she was in the hospital with CAD.  She was admitted to [facility] on 5/24 after waking up with left arm pain.  She had positive troponin with  PCI to the mid LCX and 1st DX. She is on DAPT with aspirin/Effient.  She had 2 drug-eluting stents placed in the circumflex leading into a marginal. This was apparently somewhat small circumflex. She also had a diagonal had a drug-eluting stent placed.  An echo in our office showed well preserved ejection fraction. There was some moderate aortic stenosis. There was no mention of hypertrophic obstruction although there is LVH.  She was in the hospital in April 2019 with chest pain.    She did have positive cardiac enzymes.   She underwent cardiac catheterization on 03/19/2018, this showed patent stent in the proximal to mid left circumflex artery, 20% ostial left circumflex artery disease, 40% OM 3 disease, widely patent stent in the ostial D1, 20% ostial to proximal LAD disease, 20% mid LAD disease.  Cardiac catheterization did confirm moderate aortic stenosis with mean gradient of 26.7 mmHg.  Medical therapy was recommended for her coronary artery disease.  She had a follow up echo and her AS was severe to critical.  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  (23 mm Edwards Sapien S3 Ultra).  She was in the hospital and  December with COVID.  She had atrial fibrillation with rapid ventricular response.  She was treated with IV amiodarone and sent home on oral amiodarone.  She was treated with anticoagulation.   Of note she had normal TAVR gradients with moderate mitral stenosis on her echo at the time of her admission in December. She had a urinary infection and was in the ER for this.  I do note that she had elevated troponin but this was thought to be demand ischemia. She had cardioversion on January 03 2022   She had an echo Jan 2024.  This demonstrated that severe concentric left ventricular hypertrophy with an EF of 70%.  There is an outflow gradient of 86 with no SAM and no change of Valsalva.  She has a heavily calcified mitral annulus.  There is a 13 mm gradient across the mitral valve and is estimated to be 1.1 cm.  There is no significant regurgitation.  Her TAVR seems to be functioning normally.  She has severe LVH and I discussed with her a MRI to further guide therapy.    Since I saw her she was in the hospital with respiratory failure and thought to have COPD flare.  I reviewed these extensive records.  We saw her because her troponin went up to 1360.  It was suggested that she might need a cardiac cath but not at that moment because she also had  problems with right rectus abdominis acute bleed related to heparin.  She was taken off of Eliquis at discharge.  She has been on a baby aspirin.  She was treated with steroids.  As an outpatient she was started on Qvar and she thinks this made a big difference.  Because she was off Eliquis it was a discussion about watchman.  She was also thought to have severe mitral stenosis which looked like it had progressed.  However, she went back to doing to be seen by Dr. Aline Brochure and he suggested that he would do another echocardiogram.  He decided to hold off on cardiac cath.  She also does not want to think about doing a Watchman at this point.  She thinks her breathing  improved dramatically after the Qvar although she has since had to stop it because of oral thrush.  She is going to be seen by pulmonary.  She overall feels much better.  She is not having any new shortness of breath, PND or orthopnea.  She is not having any chest pressure, neck or arm discomfort.      Past Medical History:  Diagnosis Date   Abdominal pain    Arthritis    osteoarthritis-hips,knees, back, hands   Asymmetric septal hypertrophy (HCC)    Atrial fibrillation (HCC)    CAD (coronary artery disease)    2 drug-eluting stents placed in the circumflex leading into a marginal.  May 2017.  Victoria Delgado.   catheterization on 03/19/2018, this showed patent stent in the proximal to mid left circumflex artery, 20% ostial left circumflex artery disease, 40% OM 3 disease, widely patent stent in the ostial D1, 20% ostial to proximal LAD disease, 20% mid LAD disease.   Cataract    corrective surgery done   Fatty liver    Gallstones    GERD (gastroesophageal reflux disease)    Hepatitis    hepatitis A; past hx.,many yrs ago ? food source   Hernia, hiatal    Hyperlipidemia    Hypertension    Obesity    Sleep apnea    CPAP    Past Surgical History:  Procedure Laterality Date   APPENDECTOMY  2004   CARDIOVERSION N/A 01/03/2022   Procedure: CARDIOVERSION;  Surgeon: Geralynn Rile, MD;  Location: [place];  Service: Cardiovascular;  Laterality: N/A;   CATARACT EXTRACTION W/ INTRAOCULAR LENS IMPLANT     both eyes   CHOLECYSTECTOMY     LEFT HEART CATH AND CORONARY ANGIOGRAPHY N/A 03/19/2018   Procedure: LEFT HEART CATH AND CORONARY ANGIOGRAPHY;  Surgeon: Burnell Blanks, MD;  Location: [place] [facility];  Service: Cardiovascular;  Laterality: N/A;   NM MYOCAR PERF WALL MOTION  08/15/04   No ischemia   RETINAL DETACHMENT SURGERY Bilateral    remains with slight hazy vision with lights   RIGHT/LEFT HEART CATH AND CORONARY ANGIOGRAPHY N/A 07/11/2020   Procedure: RIGHT/LEFT  HEART CATH AND CORONARY ANGIOGRAPHY;  Surgeon: Sherren Mocha, MD;  Location: [place] [facility];  Service: Cardiovascular;  Laterality: N/A;   TOTAL HIP ARTHROPLASTY Right 08/04/2014   Procedure: RIGHT TOTAL HIP ARTHROPLASTY ANTERIOR APPROACH;  Surgeon: Gearlean Alf, MD;  Location: [facility];  Service: Orthopedics;  Laterality: Right;   TOTAL HIP ARTHROPLASTY Left 01/05/2015   Procedure: LEFT TOTAL HIP ARTHROPLASTY ANTERIOR APPROACH;  Surgeon: Gearlean Alf, MD;  Location: [facility];  Service: Orthopedics;  Laterality: Left;   US ECHOCARDIOGRAPHY  06/25/2012   Moderate ASH,LV hyperdynamic,LA is mod. dilated,trace  MR,TR,AI     Current Outpatient Medications  Medication Sig Dispense Refill   ACETAMINOPHEN PO Take 650 mg by mouth daily as needed for moderate pain or headache.     aspirin EC 81 MG tablet Take 81 mg by mouth daily.     atropine 1 % ophthalmic solution Place 1 drop into the right eye daily.     cholecalciferol (VITAMIN D3) 25 MCG (1000 UNIT) tablet Take 1,000 Units by mouth 2 (two) times daily.     Coenzyme Q10 (COQ10) 100 MG CAPS Take 100 mg by mouth daily.     CRESTOR 20 MG tablet TAKE 1 TABLET(20 MG) BY MOUTH DAILY 90 tablet 3   ezetimibe (ZETIA) 10 MG tablet TAKE 1 TABLET(10 MG) BY MOUTH DAILY (Patient taking differently: Take 10 mg by mouth daily.) 90 tablet 1   levalbuterol (XOPENEX HFA) 45 MCG/ACT inhaler Inhale 1-2 puffs into the lungs every 6 (six) hours as needed for wheezing. 1 each 12   metoprolol succinate (TOPROL XL) 50 MG 24 hr tablet Take 1 tablet (50 mg total) by mouth in the morning and at bedtime. 180 tablet 3   Multiple Vitamin (MULTIVITAMIN) tablet Take 1 tablet by mouth 2 (two) times a week.     nitroGLYCERIN (NITROSTAT) 0.4 MG SL tablet Place 1 tablet (0.4 mg total) under the tongue every 5 (five) minutes as needed for chest pain. 25 tablet 3   Propylene Glycol (SYSTANE BALANCE) 0.6 % SOLN Place 1 drop into both eyes 2 (two) times daily as needed (dry eyes).      beclomethasone (QVAR) 80 MCG/ACT inhaler Inhale 2 puffs into the lungs 2 (two) times daily. (Patient not taking: Reported on 02/15/2023) 1 each 11   No current facility-administered medications for this visit.    Allergies:   Codeine, Kenalog [triamcinolone acetonide], Kenalog [triamcinolone], Pantoprazole, Relafen [nabumetone], Albuterol, Epinephrine, Penicillins, and Tramadol    ROS:  Please see the history of present illness.   Otherwise, review of systems are positive for none.   All other systems are reviewed and negative.    PHYSICAL EXAM: VS:  BP (!) 103/55 (BP Location: Left Arm, Patient Position: Sitting, Cuff Size: Large)   Pulse 66   Ht 5\' 8"  (1.727 m)   Wt 259 lb (117.5 kg)   SpO2 94%   BMI 39.38 kg/m  , BMI Body mass index is 39.38 kg/m. GENERAL:  Well appearing NECK:  No jugular venous distention, waveform within normal limits, carotid upstroke brisk and symmetric, no bruits, no thyromegaly LUNGS: Few expiratory wheezes CHEST:  Unremarkable HEART:  PMI not displaced or sustained,S1 and S2 within normal limits, no S3, no S4, no clicks, no rubs, 2 out of 6 apical brief systolic murmur radiating slightly at aortic outflow tract murmurs ABD:  Flat, positive bowel sounds normal in frequency in pitch, no bruits, no rebound, no guarding, no midline pulsatile mass, no hepatomegaly, no splenomegaly EXT:  2 plus pulses throughout, no edema, no cyanosis no clubbing   EKG:  EKG is not ordered today. NA   Recent Labs: 12/05/2022: B Natriuretic Peptide 446.5 12/06/2022: Magnesium 2.0 12/07/2022: ALT 21 12/13/2022: BUN 23; Creatinine, Ser 0.88; Potassium 4.5; Sodium 136 01/15/2023: Hemoglobin 12.6; Platelets 350.0; Pro B Natriuretic peptide (BNP) 302.0    Lipid Panel    Component Value Date/Time   CHOL 162 05/15/2022 1639   TRIG 184 (H) 05/15/2022 1639   HDL 42 05/15/2022 1639   CHOLHDL 3.9 05/15/2022 1639   CHOLHDL 5.1 03/17/2018  0034   VLDL 37 03/17/2018 0034   LDLCALC 88  05/15/2022 1639      Wt Readings from Last 3 Encounters:  02/15/23 259 lb (117.5 kg)  01/15/23 268 lb 9.6 oz (121.8 kg)  12/13/22 272 lb 11.3 oz (123.7 kg)      Other studies Reviewed: Additional studies/ records that were reviewed today include: Extensive review of hospital records Review of the above records demonstrates:  Please see elsewhere in the note.     ASSESSMENT AND PLAN:  ATRIAL FIB:   She has Ms. DANELI MUSACCHIO has a CHA2DS2 - VASc score 5.  She does not want to consider restarting Eliquis at this point.  She does not want to consider the watchman.  She would consider Eliquis in the future and we will talk about it again in about 3 months.  She might need a CT of her abdomen prior to this although I do not suspect rectus sheath bleeding will be a problem.   CAD:  She is having no unstable anginal symptoms.  No further workup.  Cardiac cath was suggested but she wanted to hold off on this.  She did have elevated troponin as mentioned.  Sleep apnea - She had trouble getting the machine from insurance.  Will talk about this again in the future.  LVH- She is breathing much better.  She went to avoid an MRI and I will again consider this in the future.   AS - She understands endocarditis prophylaxis.  She has normal TAVR function on recent echo.  MS - This was previously moderate.  It seems like it might be more severe but she is going to have a follow-up echo at [facility] in May.  Dyslipidemia - LDL was 88 with an HDL of 42.  Continue current therapy.  Hypertension  The blood pressure is well-controlled.  No change in therapy.    Current medicines are reviewed at length with the patient today.  The patient does not have concerns regarding medicines.  The following changes have been made: None  Labs/ tests ordered today include: None  No orders of the defined types were placed in this encounter.   Disposition:   FU with me in 3-4 months.     Signed, Minus Breeding, MD  02/15/2023 2:26 PM    [facility]

## 2023-02-15 ENCOUNTER — Encounter: Payer: Self-pay | Admitting: Cardiology

## 2023-02-15 ENCOUNTER — Ambulatory Visit: Payer: Medicare Other | Attending: Cardiology | Admitting: Cardiology

## 2023-02-15 VITALS — BP 103/55 | HR 66 | Ht 68.0 in | Wt 259.0 lb

## 2023-02-15 DIAGNOSIS — I350 Nonrheumatic aortic (valve) stenosis: Secondary | ICD-10-CM | POA: Diagnosis not present

## 2023-02-15 DIAGNOSIS — E785 Hyperlipidemia, unspecified: Secondary | ICD-10-CM | POA: Diagnosis not present

## 2023-02-15 DIAGNOSIS — I480 Paroxysmal atrial fibrillation: Secondary | ICD-10-CM | POA: Diagnosis not present

## 2023-02-15 DIAGNOSIS — I517 Cardiomegaly: Secondary | ICD-10-CM

## 2023-02-15 DIAGNOSIS — I10 Essential (primary) hypertension: Secondary | ICD-10-CM

## 2023-02-15 NOTE — Patient Instructions (Signed)
Medication Instructions:  The current medical regimen is effective;  continue present plan and medications.  *If you need a refill on your cardiac medications before your next appointment, please call your pharmacy*   Follow-Up: At [facility], you and your health needs are our priority.  As part of our continuing mission to provide you with exceptional heart care, we have created designated Provider Care Teams.  These Care Teams include your primary Cardiologist (physician) and Advanced Practice Providers (APPs -  Physician Assistants and Nurse Practitioners) who all work together to provide you with the care you need, when you need it.  We recommend signing up for the patient portal called "MyChart".  Sign up information is provided on this After Visit Summary.  MyChart is used to connect with patients for Virtual Visits (Telemedicine).  Patients are able to view lab/test results, encounter notes, upcoming appointments, etc.  Non-urgent messages can be sent to your provider as well.   To learn more about what you can do with MyChart, go to [email or web address].    Your next appointment:   3-4 month(s)  Provider:   Minus Breeding, MD

## 2023-02-25 ENCOUNTER — Other Ambulatory Visit: Payer: Self-pay | Admitting: Cardiology

## 2023-03-03 NOTE — Progress Notes (Unsigned)
Synopsis: Referred for dyspnea by Sueanne Margarita, DO  Subjective:   PATIENT ID: Victoria Delgado GENDER: female DOB: 1941-08-25, MRN: [id number]  No chief complaint on file.  80yF with history of CAD, AF, asymmetric septal hypertrophy, GERD/hiatal hernia, OSA on CPAP - followed by Dr. Claiborne Billings, moderate MR, AS s/p TAVR, former smoker, covid-19 infection referred for dyspnea  She has been in AF since she had covid-19. She herself had some reservations about using amio due to her breathing issues and she has actually come off of it in mid-June. Some days are worse than others with her DOE. Can't pinpoint trigger. She has never tried albuterol inhaler due to her concern about worsening her afib. She hasn't had a recent course of steroids for DOE.   Also has occasional cough productive of clearish sputum, worse in AM.   She smoked 1-1.5ppd for 20 years. Quit 1990.   Interval HPI: Started with spiriva 1 puff once daily, increased to 2 puffs once daily. She does think it has probably been helpful for DOE. Has not tried rescue inhaler. Minor cough currently, better than at last visit.   Has not needed course of steroids or ABX since last visit. --------------------------  She was hospitalized at [facility] for RSV with AECOPD and course c/b AF/RVR. Her hemoglobin has decreased to 8.9 from 14 since last visit in setting of rectus sheath hematoma (had NSTEMI with plan originally for [facility]).  She has completed steroid taper, ABX. Still taking spiriva 2 puffs once daily. Has not used her rescue inhaler at all due to fear of running into issues from AF/RVR.   Still profoundly dyspneic since hospitalization, thick whitish sputum production. No fever.  --------------------------------------------- Added qvar last visit - developed thrush and needed nystatin. But felt much better on qvar.  Otherwise pertinent review of systems is negative.  Past Medical History:  Diagnosis Date   Abdominal pain     Arthritis    osteoarthritis-hips,knees, back, hands   Asymmetric septal hypertrophy (HCC)    Atrial fibrillation (HCC)    CAD (coronary artery disease)    2 drug-eluting stents placed in the circumflex leading into a marginal.  May 2017.  [name].   catheterization on 03/19/2018, this showed patent stent in the proximal to mid left circumflex artery, 20% ostial left circumflex artery disease, 40% OM 3 disease, widely patent stent in the ostial D1, 20% ostial to proximal LAD disease, 20% mid LAD disease.   Cataract    corrective surgery done   Fatty liver    Gallstones    GERD (gastroesophageal reflux disease)    Hepatitis    hepatitis A; past hx.,many yrs ago ? food source   Hernia, hiatal    Hyperlipidemia    Hypertension    Obesity    Sleep apnea    CPAP     Family History  Problem Relation Age of Onset   Hypertension Mother    CVA Mother    Emphysema Mother        smoked   Lung cancer Father 27       asbestos exposure   Cancer - Lung Father        smoked and asbestos exp   Emphysema Sister        smoked   Esophageal cancer Other        nephew   Diabetes Other        nephew   Colon cancer Neg Hx    Pancreatic cancer Neg  Hx    Kidney disease Neg Hx    Liver disease Neg Hx      Past Surgical History:  Procedure Laterality Date   APPENDECTOMY  2004   CARDIOVERSION N/A 01/03/2022   Procedure: CARDIOVERSION;  Surgeon: Geralynn Rile, MD;  Location: [place];  Service: Cardiovascular;  Laterality: N/A;   CATARACT EXTRACTION W/ INTRAOCULAR LENS IMPLANT     both eyes   CHOLECYSTECTOMY     LEFT HEART CATH AND CORONARY ANGIOGRAPHY N/A 03/19/2018   Procedure: LEFT HEART CATH AND CORONARY ANGIOGRAPHY;  Surgeon: Burnell Blanks, MD;  Location: [place] [facility];  Service: Cardiovascular;  Laterality: N/A;   NM MYOCAR PERF WALL MOTION  08/15/04   No ischemia   RETINAL DETACHMENT SURGERY Bilateral    remains with slight hazy vision with lights    RIGHT/LEFT HEART CATH AND CORONARY ANGIOGRAPHY N/A 07/11/2020   Procedure: RIGHT/LEFT HEART CATH AND CORONARY ANGIOGRAPHY;  Surgeon: Sherren Mocha, MD;  Location: [place] [facility];  Service: Cardiovascular;  Laterality: N/A;   TOTAL HIP ARTHROPLASTY Right 08/04/2014   Procedure: RIGHT TOTAL HIP ARTHROPLASTY ANTERIOR APPROACH;  Surgeon: Gearlean Alf, MD;  Location: [facility];  Service: Orthopedics;  Laterality: Right;   TOTAL HIP ARTHROPLASTY Left 01/05/2015   Procedure: LEFT TOTAL HIP ARTHROPLASTY ANTERIOR APPROACH;  Surgeon: Gearlean Alf, MD;  Location: [facility];  Service: Orthopedics;  Laterality: Left;   US ECHOCARDIOGRAPHY  06/25/2012   Moderate ASH,LV hyperdynamic,LA is mod. dilated,trace MR,TR,AI    Social History   Socioeconomic History   Marital status: Married    Spouse name: Not on file   Number of children: 2   Years of education: Not on file   Highest education level: Not on file  Occupational History    Employer: OTHER  Tobacco Use   Smoking status: Former    Packs/day: 1.00    Years: 20.00    Additional pack years: 0.00    Total pack years: 20.00    Types: Cigarettes    Quit date: 12/03/1988    Years since quitting: 34.2   Smokeless tobacco: Never  Vaping Use   Vaping Use: Never used  Substance and Sexual Activity   Alcohol use: No    Alcohol/week: 0.0 standard drinks of alcohol   Drug use: No   Sexual activity: Yes  Other Topics Concern   Not on file  Social History Narrative   Two grandchildren.            Social Determinants of Health   Financial Resource Strain: Not on file  Food Insecurity: No Food Insecurity (12/08/2022)   Hunger Vital Sign    Worried About Running Out of Food in the Last Year: Never true    Ran Out of Food in the Last Year: Never true  Transportation Needs: No Transportation Needs (12/08/2022)   PRAPARE - Hydrologist (Medical): No    Lack of Transportation (Non-Medical): No  Physical Activity: Not on  file  Stress: Not on file  Social Connections: Not on file  Intimate Partner Violence: Not At Risk (12/08/2022)   Humiliation, Afraid, Rape, and Kick questionnaire    Fear of Current or Ex-Partner: No    Emotionally Abused: No    Physically Abused: No    Sexually Abused: No     Allergies  Allergen Reactions   Codeine Nausea Only   Kenalog [Triamcinolone Acetonide] Other (See Comments)    Had A.fib after she  received the injection."Steroid meds"   Kenalog [Triamcinolone] Rash    Other reaction(s): AFIB   Pantoprazole Rash    Other reaction(s): chronic upper GI pain all the way to back   Relafen [Nabumetone] Other (See Comments)    Edema    Albuterol Other (See Comments)    Causes SVT   Epinephrine Palpitations   Penicillins Rash    40 years ago, injection site was red and swollen.  rash, facial/tongue/throat swelling, SOB or lightheadedness with hypotension: Yes Has patient had a PCN reaction causing immediate  Has patient had a PCN reaction causing severe rash involving mucus membranes or skin necrosis: NO Has patient had a PCN reaction that required hospitalization. No  Has patient had a PCN reaction occurring within the last 10 years: No If all of the above answers are "NO", then may proceed with Cephalosporin use.     Tramadol Other (See Comments)    Auditory halllucinations     Outpatient Medications Prior to Visit  Medication Sig Dispense Refill   ACETAMINOPHEN PO Take 650 mg by mouth daily as needed for moderate pain or headache.     aspirin EC 81 MG tablet Take 81 mg by mouth daily.     atropine 1 % ophthalmic solution Place 1 drop into the right eye daily.     beclomethasone (QVAR) 80 MCG/ACT inhaler Inhale 2 puffs into the lungs 2 (two) times daily. (Patient not taking: Reported on 02/15/2023) 1 each 11   cholecalciferol (VITAMIN D3) 25 MCG (1000 UNIT) tablet Take 1,000 Units by mouth 2 (two) times daily.     Coenzyme Q10 (COQ10) 100 MG CAPS Take 100 mg by mouth  daily.     CRESTOR 20 MG tablet TAKE 1 TABLET(20 MG) BY MOUTH DAILY 90 tablet 3   ezetimibe (ZETIA) 10 MG tablet TAKE 1 TABLET(10 MG) BY MOUTH DAILY 90 tablet 1   levalbuterol (XOPENEX HFA) 45 MCG/ACT inhaler Inhale 1-2 puffs into the lungs every 6 (six) hours as needed for wheezing. 1 each 12   metoprolol succinate (TOPROL XL) 50 MG 24 hr tablet Take 1 tablet (50 mg total) by mouth in the morning and at bedtime. 180 tablet 3   Multiple Vitamin (MULTIVITAMIN) tablet Take 1 tablet by mouth 2 (two) times a week.     nitroGLYCERIN (NITROSTAT) 0.4 MG SL tablet Place 1 tablet (0.4 mg total) under the tongue every 5 (five) minutes as needed for chest pain. 25 tablet 3   Propylene Glycol (SYSTANE BALANCE) 0.6 % SOLN Place 1 drop into both eyes 2 (two) times daily as needed (dry eyes).     No facility-administered medications prior to visit.       Objective:   Physical Exam:  General appearance: 82 y.o., female, NAD, conversant  Eyes: anicteric sclerae; PERRL, tracking appropriately HENT: NCAT; MMM Neck: Trachea midline; no lymphadenopathy, no JVD Lungs: CTAB, no crackles, no wheeze, with normal respiratory effort CV: RRR, +diastolic murmur Abdomen: Soft, non-tender; non-distended, BS present  Extremities: No peripheral edema, warm Skin: Normal turgor and texture; no rash Psych: Appropriate affect Neuro: Alert and oriented to person and place, no focal deficit     There were no vitals filed for this visit.     on RA BMI Readings from Last 3 Encounters:  02/15/23 39.38 kg/m  01/15/23 40.84 kg/m  12/13/22 41.47 kg/m   Wt Readings from Last 3 Encounters:  02/15/23 259 lb (117.5 kg)  01/15/23 268 lb 9.6 oz (121.8 kg)  12/13/22  272 lb 11.3 oz (123.7 kg)     CBC    Component Value Date/Time   WBC 8.8 01/15/2023 1343   RBC 4.14 01/15/2023 1343   HGB 12.6 01/15/2023 1343   HGB 14.7 07/07/2020 1112   HCT 38.3 01/15/2023 1343   HCT 44.4 07/07/2020 1112   PLT 350.0  01/15/2023 1343   PLT 234 07/07/2020 1112   MCV 92.6 01/15/2023 1343   MCV 89 07/07/2020 1112   MCH 28.3 12/13/2022 0104   MCHC 33.0 01/15/2023 1343   RDW 20.9 (H) 01/15/2023 1343   RDW 15.5 (H) 07/07/2020 1112   LYMPHSABS 2.4 01/15/2023 1343   MONOABS 0.8 01/15/2023 1343   EOSABS 0.1 01/15/2023 1343   BASOSABS 0.1 01/15/2023 1343     Chest Imaging: 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, mosaicism, LLL scarring  Pulmonary Functions Testing Results:    Latest Ref Rng & Units 04/27/2022    1:35 PM 11/06/2019    8:19 AM  PFT Results  FVC-Pre L 2.09  2.31   FVC-Predicted Pre % 65  70   FVC-Post L 2.18  2.48   FVC-Predicted Post % 68  75   Pre FEV1/FVC % % 74  79   Post FEV1/FCV % % 78  77   FEV1-Pre L 1.55  1.82   FEV1-Predicted Pre % 64  73   FEV1-Post L 1.69  1.91   DLCO uncorrected ml/min/mmHg 14.05  20.26   DLCO UNC% % 65  93   DLVA Predicted % 85  112   TLC L 5.26  5.51   TLC % Predicted % 93  97   RV % Predicted % 115  112    PFT 04/27/22 reviewed by me with nonspecific ventilatory defect, mildly reduced diffusing capacity    Echocardiogram 12/06/22:   1. Left ventricular ejection fraction, by estimation, is 70 to 75%. The  left ventricle has hyperdynamic function. The left ventricle has no  regional wall motion abnormalities. There is moderate left ventricular  hypertrophy. Left ventricular diastolic  parameters are indeterminate.   2. Resting peak LVOT gradient 84 mmHg   3. Right ventricular systolic function is normal. The right ventricular  size is normal. Tricuspid regurgitation signal is inadequate for assessing  PA pressure.   4. Left atrial size was mildly dilated.   5. A small pericardial effusion is present.   6. The mitral valve is degenerative. Trivial mitral valve regurgitation.  Severe mitral stenosis. The mean mitral valve gradient is 16.0 mmHg with  average heart rate of 82 bpm. Severe mitral annular calcification.   7. The aortic  valve has been repaired/replaced. Aortic valve  regurgitation is not visualized. There is a 23 mm Edwards Sapien  prosthetic (TAVR) valve present in the aortic position. Procedure Date:  05/14/22. 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 Although she doesn't have obstruction on PFT and her air trapping on HRCT could just be due to obesity, she does have emphysema and it's probably worth a trial of a controller inhaler. Her pattern of emphysema is interesting and almost looks like LAM but is probably just due to smoking and with her relatively  limited symptoms I'm not sure how much value testing for VEGF-D would confer over and above a good controller inhaler. I don't see any clear evidence of amiodarone related lung injury. She remains apprehensive about any inhaler that may worsen her control of AF but has tolerated low dose LAMA well to decent effect wrt cough, dyspnea, tolerated xopenex in the hospital.  # Acute dyspnea Doesn't sound like she's in exacerbation on exam today though recently had AECOPD due to RSV. Her anemia with significant decline in Hb is probably playing some role. Hasn't had remarkable weight gain and doesn't appear remarkably volume overloaded on exam to suggest decompensated MS/CHF. This does not feel like her anginal equivalent (L arm pain/numbness).   Plan: - CXR, BNP, CBC/diff - I will price out inhaled steroids today  - Continue spiriva 1.25 2 puffs daily - keep going with iron, ensuring you're having regular bowel movements (ok to use miralax as needed to help with this) - xopenex 1-2 puffs as needed is your rescue inhaler - I think it would be ok to give this  a shot but call us if you have issues with heart rate afterward - see you in 6 weeks or sooner if need be!     Maryjane Hurter, MD [facility] 03/03/2023 3:12 PM

## 2023-03-04 ENCOUNTER — Telehealth: Payer: Self-pay | Admitting: Student

## 2023-03-04 ENCOUNTER — Encounter: Payer: Self-pay | Admitting: Student

## 2023-03-04 ENCOUNTER — Ambulatory Visit: Payer: Medicare Other | Admitting: Student

## 2023-03-04 VITALS — BP 128/68 | HR 66 | Temp 98.0°F | Ht 68.0 in | Wt 264.0 lb

## 2023-03-04 DIAGNOSIS — R0609 Other forms of dyspnea: Secondary | ICD-10-CM | POA: Diagnosis not present

## 2023-03-04 DIAGNOSIS — R053 Chronic cough: Secondary | ICD-10-CM | POA: Diagnosis not present

## 2023-03-04 NOTE — Patient Instructions (Signed)
-   I'll send message to pharmacy to find out which non-DPI inhaler your insurance will prefer - xopenex 1-2 puffs as needed is your rescue inhaler - I think it would be ok to give this a shot but call us if you have issues with heart rate afterward - see you in 3 months or sooner if need be!

## 2023-03-04 NOTE — Telephone Encounter (Signed)
Which non-DPI ICS inhaler would be least expensive? She has tried and failed qvar due to thrush but had good symptomatic response otherwise. Needs to find one she can use with spacer to decrease risk of thrush.,  Thanks!

## 2023-03-05 ENCOUNTER — Other Ambulatory Visit (HOSPITAL_COMMUNITY): Payer: Self-pay

## 2023-03-05 NOTE — Telephone Encounter (Signed)
Qvar is the only non-DPI inhaler that is covered under the patients plan at this time.

## 2023-03-08 NOTE — Telephone Encounter (Signed)
Spoke with patient advised per Dr. Thora Lance can use both Qvar and Spiriva. She verbalized understanding. NFN

## 2023-03-08 NOTE — Telephone Encounter (Signed)
Yep no trouble at all using both inhalers

## 2023-03-08 NOTE — Telephone Encounter (Signed)
Received a message from patient in regards to the Qvar and Spiriva. She wanted to see if she could use both of these at the same time due to the increase in pollen.

## 2023-03-17 NOTE — Telephone Encounter (Signed)
Spiriva 1.25 respimat 2 puffs daily

## 2023-03-18 ENCOUNTER — Ambulatory Visit: Payer: Medicare Other | Admitting: Internal Medicine

## 2023-03-18 MED ORDER — SPIRIVA RESPIMAT 1.25 MCG/ACT IN AERS: 2.0000 | INHALATION_SPRAY | Freq: Every day | RESPIRATORY_TRACT | 11 refills | Status: DC

## 2023-03-18 NOTE — Addendum Note (Signed)
Addended by: Wyvonne Lenz on: 03/18/2023 08:01 AM   Modules accepted: Orders

## 2023-03-18 NOTE — Telephone Encounter (Signed)
Rx for Spiriva has been sent to pharmacy for pt. Mychart message sent to pt letting her know this was done. Nothing further needed.

## 2023-03-27 ENCOUNTER — Other Ambulatory Visit: Payer: Self-pay | Admitting: Cardiology

## 2023-04-24 ENCOUNTER — Telehealth: Payer: Self-pay | Admitting: Cardiology

## 2023-04-24 NOTE — Telephone Encounter (Signed)
She states the supplier has gone out of business and she was referred to [facility]. She states she does not like [facility] and would like for her records to be sent somewhere else. Please advise.

## 2023-04-25 NOTE — Telephone Encounter (Signed)
The patient has been contacted. Left detailed message on voicemail and informed patient to call back. Illyanna Petillo Green, CMA. 

## 2023-04-30 ENCOUNTER — Telehealth: Payer: Self-pay | Admitting: Cardiovascular Disease

## 2023-04-30 NOTE — Telephone Encounter (Signed)
Patient is scheduled to see Dr. Tresa Endo for his first available at this time 10/09, but is concerned with having to stop her CPAP in the meantime due to being advised he cannot send in a prescription until she is seen. Patient's last appt was in 2022, and she was added to the wait list as high priority.   She requested a message be sent in addition to this due to wanting to see if there is anything Dr. Tresa Endo can do to assist further with this as well as to make Dr. Antoine Poche aware.

## 2023-05-01 ENCOUNTER — Other Ambulatory Visit: Payer: Self-pay | Admitting: Student

## 2023-05-22 IMAGING — US US FNA BIOPSY THYROID 1ST LESION
1 series · 12 of 12 positions shown · non-contrast
Comparison: Thyroid ultrasound dated 03/14/2021

INDICATION: Patient with history of previous biopsy of a left mid thyroid nodule
in 9192 which revealed scant follicular epithelium. Latest
ultrasound on 03/14/2021 revealed a stable 3.1 cm left mid nodule as
well as a 1.2 cm right inferior nodule. Patient presents today for
repeat biopsy of left mid thyroid nodule.

EXAM:
ULTRASOUND GUIDED FINE NEEDLE ASPIRATION BIOPSY OF LEFT MID THYROID
NODULE
TECHNIQUE: Informed written consent was obtained from the patient after a
discussion of the risks, benefits and alternatives to treatment.
Questions regarding the procedure were encouraged and answered. A
timeout was performed prior to the initiation of the procedure.

[Series 1: us fna biopsy thyroid 1st lesion · 0.06mm/px · 12 acquisitions, 12 frames shown]
[im 1/12]
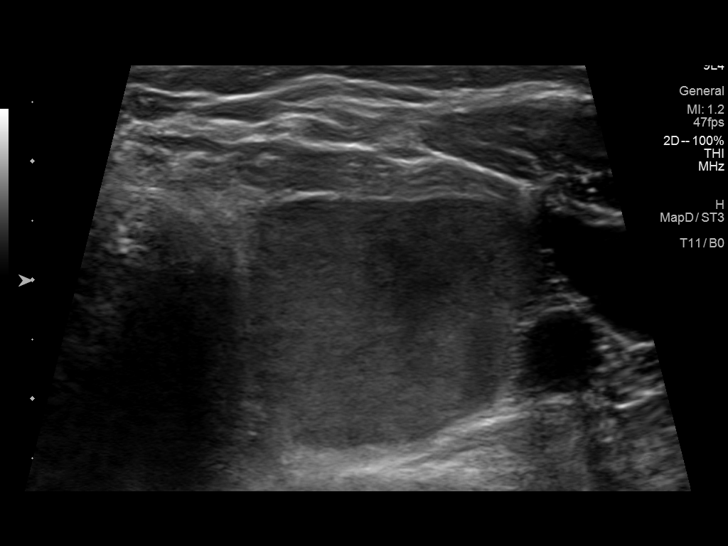
[im 2/12]
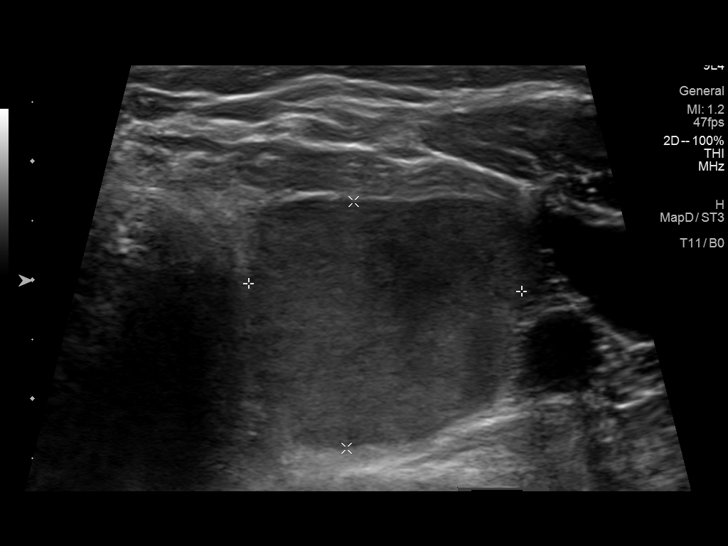
[im 3/12]
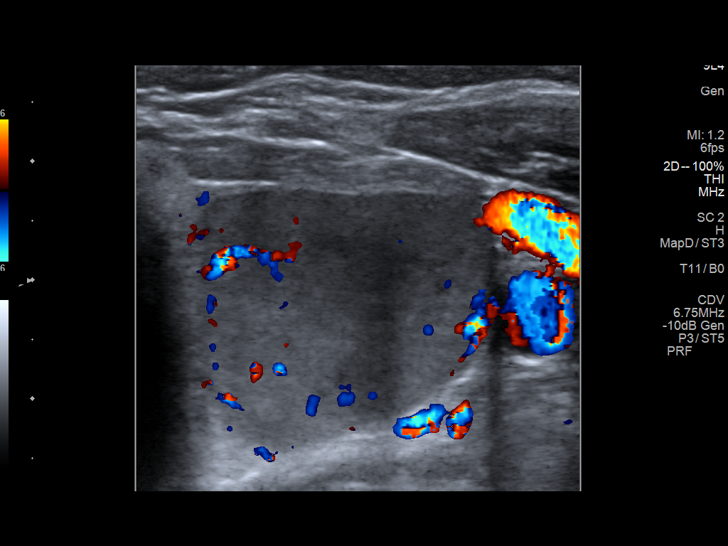
[im 4/12]
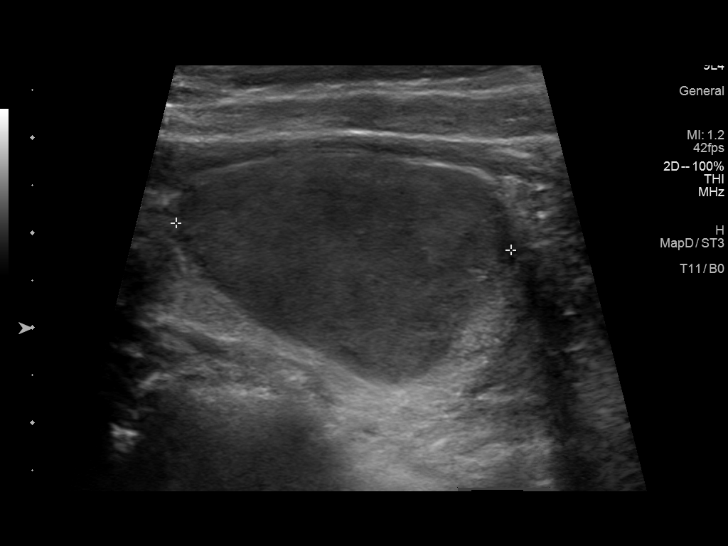
[im 5/12]
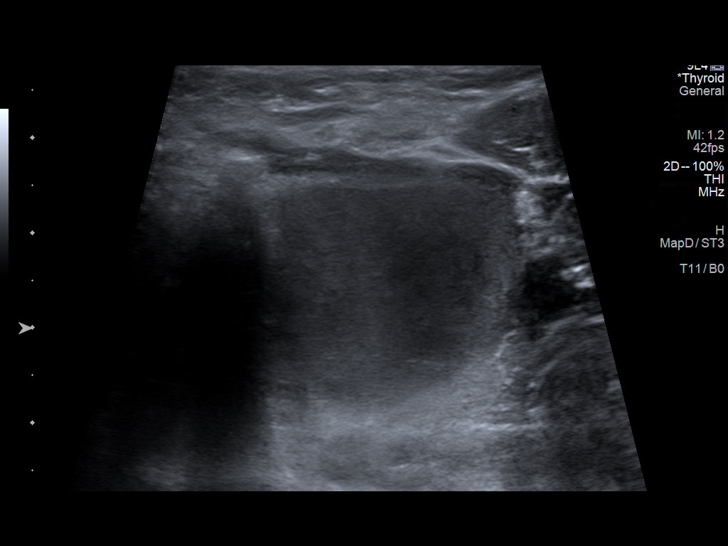
[im 6/12]
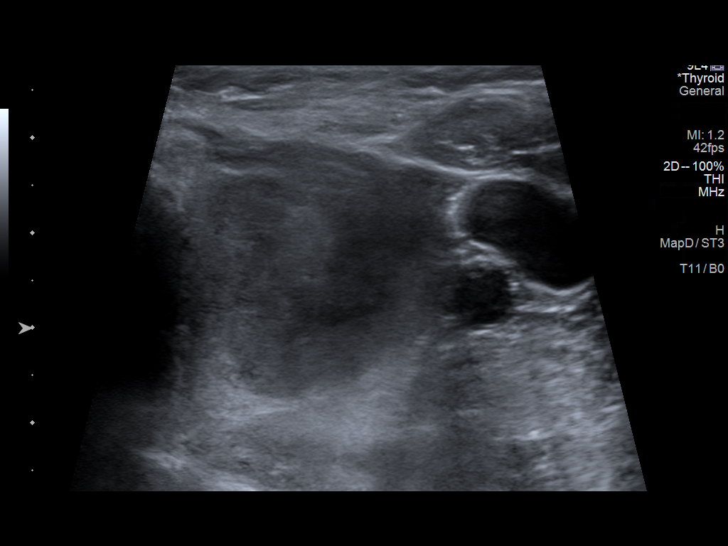
[im 7/12]
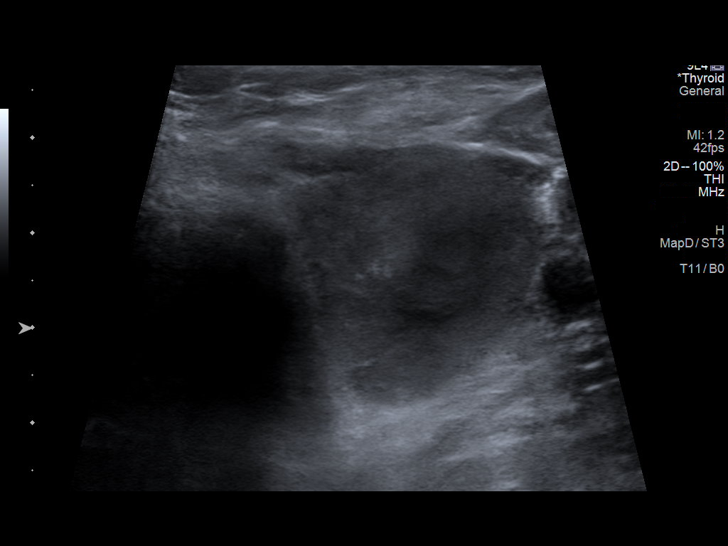
[im 8/12]
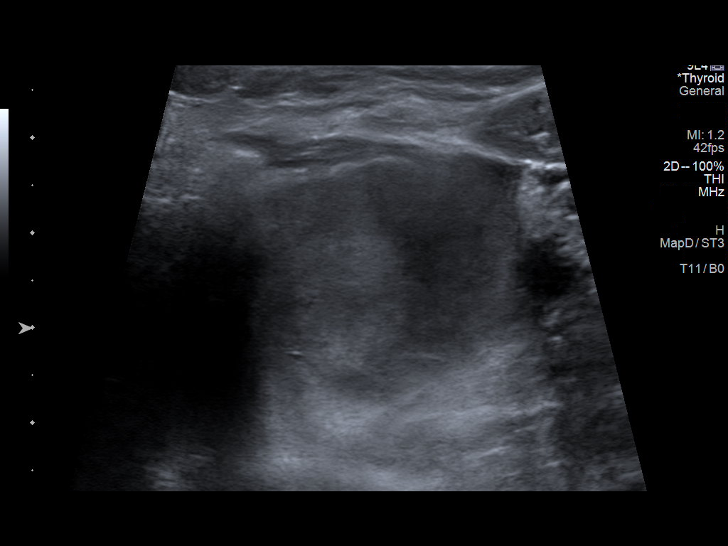
[im 9/12]
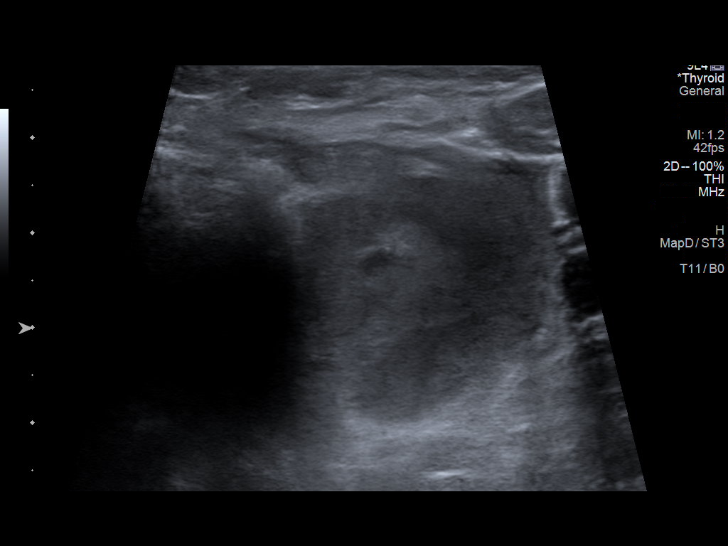
[im 10/12]
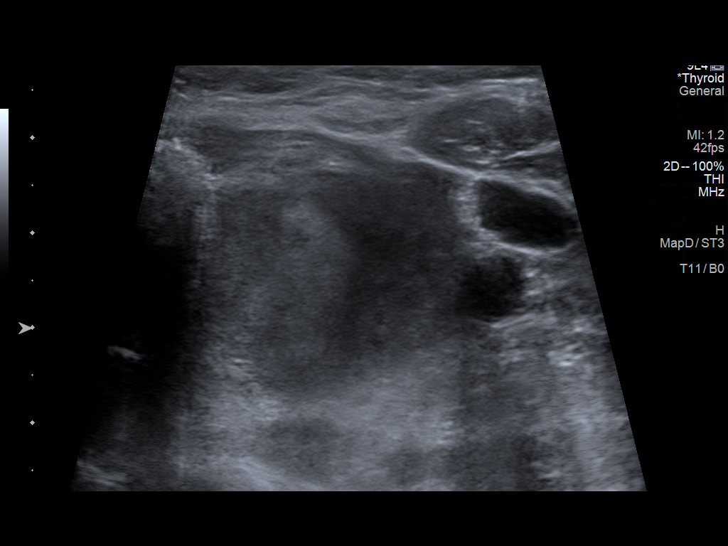
[im 11/12]
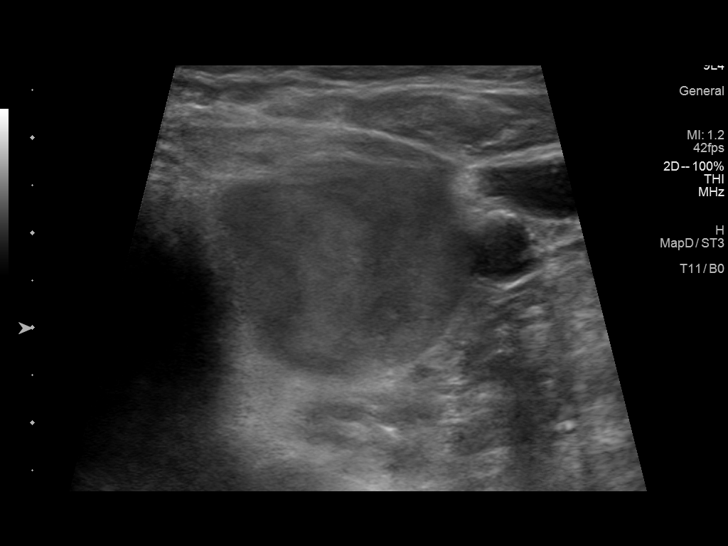
[im 12/12]
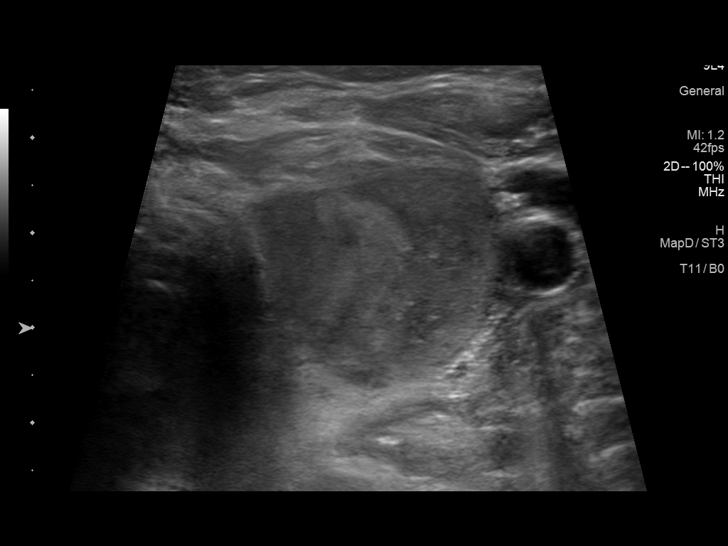

[12 of 12 positions shown; findings below may reference images not displayed]

MEDICATIONS:
1% lidocaine to skin and subcutaneous tissue

COMPLICATIONS:
None immediate.
Pre-procedural ultrasound scanning demonstrated unchanged size and
appearance of the indeterminate nodule within the left mid thyroid
lobe

The procedure was planned. The neck was prepped in the usual sterile
fashion, and a sterile drape was applied covering the operative
field. A timeout was performed prior to the initiation of the
procedure. Local anesthesia was provided with 1% lidocaine.

Under direct ultrasound guidance, 6 FNA biopsies were performed of
the left mid thyroid nodule with 25 gauge needles. Multiple
ultrasound images were saved for procedural documentation purposes.
The samples were prepared and submitted to pathology as well as for
Afirma testing.

Limited post procedural scanning was negative for hematoma or
additional complication. Dressings were placed. The patient
tolerated the above procedures procedure well without immediate
postprocedural complication.
FINDINGS: Nodule reference number based on prior diagnostic ultrasound: 2

Maximum size: 3.1 cm

Location: Left; Mid

ACR TI-RADS risk category: TR5 (>/= 7 points)

Reason for biopsy: nondiagnostic on prior biopsy

Ultrasound imaging confirms appropriate placement of the needles
within the thyroid nodule.
IMPRESSION: Technically successful ultrasound guided fine needle aspiration
biopsy of left mid thyroid nodule. Final pathology pending.

## 2023-05-26 NOTE — Progress Notes (Unsigned)
Cardiology Office Note:   Date:  05/28/2023  ID:  Victoria Delgado, DOB 08-28-1941, MRN [id number] PCP: Charlane Ferretti, DO  [facility] Providers Cardiologist:  Rollene Rotunda, MD {  History of Present Illness:   Victoria Delgado is a 82 y.o. female who presents for follow up of CAD.  She has atrial fibrillation which she ultimately thinks was related to a steroid injection. This was in 2008. At that time she was found to have asymmetric septal hypertrophy. This has been followed conservatively.  Echocardiogram did demonstrate concentric LV hypertrophy which was moderate and moderate LAE.  In May of 2017 she was in the hospital with CAD.  She was admitted to [facility] on 5/24 after waking up with left arm pain.  She had positive troponin with  PCI to the mid LCX and 1st DX. She is on DAPT with aspirin/Effient.  She had 2 drug-eluting stents placed in the circumflex leading into a marginal. This was apparently somewhat small circumflex. She also had a diagonal had a drug-eluting stent placed.  An echo in our office showed well preserved ejection fraction. There was some moderate aortic stenosis. There was no mention of hypertrophic obstruction although there is LVH.  She was in the hospital in April 2019 with chest pain.    She did have positive cardiac enzymes.   She underwent cardiac catheterization on 03/19/2018, this showed patent stent in the proximal to mid left circumflex artery, 20% ostial left circumflex artery disease, 40% OM 3 disease, widely patent stent in the ostial D1, 20% ostial to proximal LAD disease, 20% mid LAD disease.  Cardiac catheterization did confirm moderate aortic stenosis with mean gradient of 26.7 mmHg.  Medical therapy was recommended for her coronary artery disease.  She had a follow up echo and her AS was severe to critical.  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  (23 mm Edwards Sapien S3 Ultra).  She was in the hospital and December with COVID.  She had atrial fibrillation  with rapid ventricular response.  She was treated with IV amiodarone and sent home on oral amiodarone.  She was treated with anticoagulation.   Of note she had normal TAVR gradients with moderate mitral stenosis on her echo at the time of her admission in December. She had a urinary infection and was in the ER for this.  I do note that she had elevated troponin but this was thought to be demand ischemia.   She had PAF earlier this year.  She is also had a rectus sheath hemorrhage with acute blood loss and has been off of anticoagulation.  She has not had any further atrial fibrillation or tachypalpitations.    She was seen at [facility] last week.  I reviewed these records for this visit.   Her echo on 523/24  reveals normal LV and RV function. There is a mean gradient across the aortic prosthesis, 2.3 m/sec peak velocity. She has moderate mitral stenosis (9 mmHg mean gradient).   I was not able to review the images but LVH was reported to be only mild.  She feels well.  She does her household chores.  She has had some elevated blood sugars.  She is also had some lower blood pressures and heart rates with a heart rate in the 40s at times and pressures systolic in the 80s.  ROS: As stated in the HPI and negative for all other systems.  Studies Reviewed:    EKG:   NA  Risk Assessment/Calculations:    CHA2DS2-VASc Score = 5   This indicates a 7.2% annual risk of stroke. The patient's score is based upon: CHF History: 0 HTN History: 1 Diabetes History: 0 Stroke History: 0 Vascular Disease History: 1 Age Score: 2 Gender Score: 1             Physical Exam:   VS:  BP 130/60   Pulse (!) 58   Ht 5\' 8"  (1.727 m)   Wt 265 lb 3.2 oz (120.3 kg)   SpO2 97%   BMI 40.32 kg/m    Wt Readings from Last 3 Encounters:  05/28/23 265 lb 3.2 oz (120.3 kg)  03/04/23 264 lb (119.7 kg)  02/15/23 259 lb (117.5 kg)     GEN: Well nourished, well developed in no acute distress NECK: No JVD; No  carotid bruits CARDIAC: RRR, 2/6 apical systolic murmur, no diastolic murmurs, rubs, gallops RESPIRATORY:  Clear to auscultation without rales, wheezing or rhonchi  ABDOMEN: Soft, non-tender, non-distended EXTREMITIES:  No edema; No deformity   ASSESSMENT AND PLAN:   ATRIAL FIB:   She has Ms. Victoria Delgado has a CHA2DS2 - VASc score 5.  She has had no recurrence.  She had problems with bleeding.  She wants to avoid other therapies such as atrial appendage occluder or anticoagulation.  She says she would let me know if she has any recurrent atrial fibrillation.  At this point no change in therapy.   CAD:  The patient has no new sypmtoms.  No further cardiovascular testing is indicated.  We will continue with aggressive risk reduction and meds as listed.  Sleep apnea - She was having trouble getting her equipment and we are going to try to facilitate this.   Elevated blood sugar: She says her blood sugars been elevated and she is going to be seen by her primary provider for this.   LVH- This was mild on the last echo that severe previously.  No change in therapy.  AS - She understands endocarditis prophylaxis.  She had normal TAVR function.  No change in therapy.  MS - This was previously moderate.  She is   Dyslipidemia - I do not see the most recent lipid on her own with like to get that from her primary provider.  The goal LDL will be in the 50s   Hypertension  The blood pressure has been running low with a low heart rate so I am going to reduce her metoprolol to 50 mg daily.       Follow up me in one year.    Signed, Rollene Rotunda, MD

## 2023-05-28 ENCOUNTER — Ambulatory Visit: Payer: Medicare Other | Attending: Cardiology | Admitting: Cardiology

## 2023-05-28 ENCOUNTER — Encounter: Payer: Self-pay | Admitting: Cardiology

## 2023-05-28 VITALS — BP 130/60 | HR 58 | Ht 68.0 in | Wt 265.2 lb

## 2023-05-28 DIAGNOSIS — I2510 Atherosclerotic heart disease of native coronary artery without angina pectoris: Secondary | ICD-10-CM

## 2023-05-28 DIAGNOSIS — Z952 Presence of prosthetic heart valve: Secondary | ICD-10-CM | POA: Diagnosis not present

## 2023-05-28 DIAGNOSIS — I517 Cardiomegaly: Secondary | ICD-10-CM

## 2023-05-28 DIAGNOSIS — I480 Paroxysmal atrial fibrillation: Secondary | ICD-10-CM | POA: Diagnosis not present

## 2023-05-28 DIAGNOSIS — I10 Essential (primary) hypertension: Secondary | ICD-10-CM

## 2023-05-28 DIAGNOSIS — E785 Hyperlipidemia, unspecified: Secondary | ICD-10-CM

## 2023-05-28 MED ORDER — METOPROLOL SUCCINATE ER 50 MG PO TB24: 50.0000 mg | ORAL_TABLET | Freq: Every day | ORAL | 3 refills | Status: DC

## 2023-05-28 NOTE — Progress Notes (Signed)
Synopsis: Referred for dyspnea by Charlane Ferretti, DO  Subjective:   PATIENT ID: Victoria Delgado GENDER: female DOB: 1941-10-11, MRN: [id number]  No chief complaint on file.  82yF with history of CAD, AF, asymmetric septal hypertrophy, GERD/hiatal hernia, OSA on CPAP - followed by Dr. Tresa Endo, moderate MR, AS s/p TAVR, former smoker, covid-19 infection referred for dyspnea  She has been in AF since she had covid-19. She herself had some reservations about using amio due to her breathing issues and she has actually come off of it in mid-June. Some days are worse than others with her DOE. Can't pinpoint trigger. She has never tried albuterol inhaler due to her concern about worsening her afib. She hasn't had a recent course of steroids for DOE.   Also has occasional cough productive of clearish sputum, worse in AM.   She smoked 1-1.5ppd for 20 years. Quit 1990.   Interval HPI: Started with spiriva 1 puff once daily, increased to 2 puffs once daily. She does think it has probably been helpful for DOE. Has not tried rescue inhaler. Minor cough currently, better than at last visit.   Has not needed course of steroids or ABX since last visit. --------------------------  She was hospitalized at [facility] for RSV with AECOPD and course c/b AF/RVR. Her hemoglobin has decreased to 8.9 from 14 since last visit in setting of rectus sheath hematoma (had NSTEMI with plan originally for [facility]).  She has completed steroid taper, ABX. Still taking spiriva 2 puffs once daily. Has not used her rescue inhaler at all due to fear of running into issues from AF/RVR.   Still profoundly dyspneic since hospitalization, thick whitish sputum production. No fever.  --------------------------------------------- Added qvar last visit - developed thrush and needed nystatin. But felt much better on qvar, nearly back to baseline. Hasn't resumed it since she had thrush. Does still have minor cough and phlegm production maybe in  evenings.  ---------------------------------------------------- No new imaging/PFT  Started on spiriva after last visit  Otherwise pertinent review of systems is negative.  Past Medical History:  Diagnosis Date   Abdominal pain    Arthritis    osteoarthritis-hips,knees, back, hands   Asymmetric septal hypertrophy (HCC)    Atrial fibrillation (HCC)    CAD (coronary artery disease)    2 drug-eluting stents placed in the circumflex leading into a marginal.  May 2017.  [name].   catheterization on 03/19/2018, this showed patent stent in the proximal to mid left circumflex artery, 20% ostial left circumflex artery disease, 40% OM 3 disease, widely patent stent in the ostial D1, 20% ostial to proximal LAD disease, 20% mid LAD disease.   Cataract    corrective surgery done   Fatty liver    Gallstones    GERD (gastroesophageal reflux disease)    Hepatitis    hepatitis A; past hx.,many yrs ago ? food source   Hernia, hiatal    Hyperlipidemia    Hypertension    Obesity    Sleep apnea    CPAP     Family History  Problem Relation Age of Onset   Hypertension Mother    CVA Mother    Emphysema Mother        smoked   Lung cancer Father 10       asbestos exposure   Cancer - Lung Father        smoked and asbestos exp   Emphysema Sister        smoked   Esophageal  cancer Other        nephew   Diabetes Other        nephew   Colon cancer Neg Hx    Pancreatic cancer Neg Hx    Kidney disease Neg Hx    Liver disease Neg Hx      Past Surgical History:  Procedure Laterality Date   APPENDECTOMY  2004   CARDIOVERSION N/A 01/03/2022   Procedure: CARDIOVERSION;  Surgeon: Sande Rives, MD;  Location: [facility] ENDOSCOPY;  Service: Cardiovascular;  Laterality: N/A;   CATARACT EXTRACTION W/ INTRAOCULAR LENS IMPLANT     both eyes   CHOLECYSTECTOMY     LEFT HEART CATH AND CORONARY ANGIOGRAPHY N/A 03/19/2018   Procedure: LEFT HEART CATH AND CORONARY ANGIOGRAPHY;  Surgeon: Kathleene Hazel, MD;  Location: [facility] INVASIVE CV LAB;  Service: Cardiovascular;  Laterality: N/A;   NM MYOCAR PERF WALL MOTION  08/15/04   No ischemia   RETINAL DETACHMENT SURGERY Bilateral    remains with slight hazy vision with lights   RIGHT/LEFT HEART CATH AND CORONARY ANGIOGRAPHY N/A 07/11/2020   Procedure: RIGHT/LEFT HEART CATH AND CORONARY ANGIOGRAPHY;  Surgeon: Tonny Bollman, MD;  Location: [facility] INVASIVE CV LAB;  Service: Cardiovascular;  Laterality: N/A;   TOTAL HIP ARTHROPLASTY Right 08/04/2014   Procedure: RIGHT TOTAL HIP ARTHROPLASTY ANTERIOR APPROACH;  Surgeon: Loanne Drilling, MD;  Location: [facility] ORS;  Service: Orthopedics;  Laterality: Right;   TOTAL HIP ARTHROPLASTY Left 01/05/2015   Procedure: LEFT TOTAL HIP ARTHROPLASTY ANTERIOR APPROACH;  Surgeon: Loanne Drilling, MD;  Location: [facility] ORS;  Service: Orthopedics;  Laterality: Left;   US ECHOCARDIOGRAPHY  06/25/2012   Moderate ASH,LV hyperdynamic,LA is mod. dilated,trace MR,TR,AI    Social History   Socioeconomic History   Marital status: Married    Spouse name: Not on file   Number of children: 2   Years of education: Not on file   Highest education level: Not on file  Occupational History    Employer: OTHER  Tobacco Use   Smoking status: Former    Packs/day: 1.00    Years: 20.00    Additional pack years: 0.00    Total pack years: 20.00    Types: Cigarettes    Quit date: 12/03/1988    Years since quitting: 34.5   Smokeless tobacco: Never  Vaping Use   Vaping Use: Never used  Substance and Sexual Activity   Alcohol use: No    Alcohol/week: 0.0 standard drinks of alcohol   Drug use: No   Sexual activity: Yes  Other Topics Concern   Not on file  Social History Narrative   Two grandchildren.            Social Determinants of Health   Financial Resource Strain: Not on file  Food Insecurity: No Food Insecurity (12/08/2022)   Hunger Vital Sign    Worried About Running Out of Food in the Last Year: Never true    Ran Out  of Food in the Last Year: Never true  Transportation Needs: No Transportation Needs (12/08/2022)   PRAPARE - Administrator, Civil Service (Medical): No    Lack of Transportation (Non-Medical): No  Physical Activity: Not on file  Stress: Not on file  Social Connections: Not on file  Intimate Partner Violence: Not At Risk (12/08/2022)   Humiliation, Afraid, Rape, and Kick questionnaire    Fear of Current or Ex-Partner: No    Emotionally Abused: No    Physically Abused: No  Sexually Abused: No     Allergies  Allergen Reactions   Codeine Nausea Only   Kenalog [Triamcinolone Acetonide] Other (See Comments)    Had A.fib after she received the injection."Steroid meds"   Kenalog [Triamcinolone] Rash    Other reaction(s): AFIB   Pantoprazole Rash    Other reaction(s): chronic upper GI pain all the way to back   Relafen [Nabumetone] Other (See Comments)    Edema    Albuterol Other (See Comments)    Causes SVT   Epinephrine Palpitations   Penicillins Rash    40 years ago, injection site was red and swollen.  rash, facial/tongue/throat swelling, SOB or lightheadedness with hypotension: Yes Has patient had a PCN reaction causing immediate  Has patient had a PCN reaction causing severe rash involving mucus membranes or skin necrosis: NO Has patient had a PCN reaction that required hospitalization. No  Has patient had a PCN reaction occurring within the last 10 years: No If all of the above answers are "NO", then may proceed with Cephalosporin use.     Tramadol Other (See Comments)    Auditory halllucinations     Outpatient Medications Prior to Visit  Medication Sig Dispense Refill   ACETAMINOPHEN PO Take 650 mg by mouth daily as needed for moderate pain or headache.     aspirin EC 81 MG tablet Take 81 mg by mouth daily.     atropine 1 % ophthalmic solution Place 1 drop into the right eye daily.     beclomethasone (QVAR) 80 MCG/ACT inhaler Inhale 2 puffs into the lungs 2  (two) times daily. (Patient not taking: Reported on 03/04/2023) 1 each 11   cholecalciferol (VITAMIN D3) 25 MCG (1000 UNIT) tablet Take 1,000 Units by mouth 2 (two) times daily.     Coenzyme Q10 (COQ10) 100 MG CAPS Take 100 mg by mouth daily.     CRESTOR 20 MG tablet TAKE 1 TABLET(20 MG) BY MOUTH DAILY 90 tablet 3   ezetimibe (ZETIA) 10 MG tablet TAKE 1 TABLET(10 MG) BY MOUTH DAILY 90 tablet 1   levalbuterol (XOPENEX HFA) 45 MCG/ACT inhaler Inhale 1-2 puffs into the lungs every 6 (six) hours as needed for wheezing. 1 each 12   Multiple Vitamin (MULTIVITAMIN) tablet Take 1 tablet by mouth 2 (two) times a week.     nitroGLYCERIN (NITROSTAT) 0.4 MG SL tablet Place 1 tablet (0.4 mg total) under the tongue every 5 (five) minutes as needed for chest pain. 25 tablet 3   Propylene Glycol (SYSTANE BALANCE) 0.6 % SOLN Place 1 drop into both eyes 2 (two) times daily as needed (dry eyes).     Tiotropium Bromide Monohydrate (SPIRIVA RESPIMAT) 1.25 MCG/ACT AERS Inhale 2 puffs into the lungs daily. 4 g 11   TOPROL XL 50 MG 24 hr tablet TAKE 1 TABLET(50 MG) BY MOUTH IN THE MORNING AND AT BEDTIME 180 tablet 3   No facility-administered medications prior to visit.       Objective:   Physical Exam:  General appearance: 82 y.o., female, NAD, conversant  Eyes: anicteric sclerae; PERRL, tracking appropriately HENT: NCAT; MMM Neck: Trachea midline; no lymphadenopathy, no JVD Lungs: CTAB, no crackles, no wheeze, with normal respiratory effort CV: RRR, +diastolic murmur Abdomen: Soft, non-tender; non-distended, BS present  Extremities: No peripheral edema, warm Skin: Normal turgor and texture; no rash Psych: Appropriate affect Neuro: Alert and oriented to person and place, no focal deficit     There were no vitals filed for this visit.  on RA BMI Readings from Last 3 Encounters:  03/04/23 40.14 kg/m  02/15/23 39.38 kg/m  01/15/23 40.84 kg/m   Wt Readings from Last 3 Encounters:  03/04/23  264 lb (119.7 kg)  02/15/23 259 lb (117.5 kg)  01/15/23 268 lb 9.6 oz (121.8 kg)     CBC    Component Value Date/Time   WBC 8.8 01/15/2023 1343   RBC 4.14 01/15/2023 1343   HGB 12.6 01/15/2023 1343   HGB 14.7 07/07/2020 1112   HCT 38.3 01/15/2023 1343   HCT 44.4 07/07/2020 1112   PLT 350.0 01/15/2023 1343   PLT 234 07/07/2020 1112   MCV 92.6 01/15/2023 1343   MCV 89 07/07/2020 1112   MCH 28.3 12/13/2022 0104   MCHC 33.0 01/15/2023 1343   RDW 20.9 (H) 01/15/2023 1343   RDW 15.5 (H) 07/07/2020 1112   LYMPHSABS 2.4 01/15/2023 1343   MONOABS 0.8 01/15/2023 1343   EOSABS 0.1 01/15/2023 1343   BASOSABS 0.1 01/15/2023 1343     Chest Imaging: 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, mosaicism, LLL scarring  Pulmonary Functions Testing Results:    Latest Ref Rng & Units 04/27/2022    1:35 PM 11/06/2019    8:19 AM  PFT Results  FVC-Pre L 2.09  2.31   FVC-Predicted Pre % 65  70   FVC-Post L 2.18  2.48   FVC-Predicted Post % 68  75   Pre FEV1/FVC % % 74  79   Post FEV1/FCV % % 78  77   FEV1-Pre L 1.55  1.82   FEV1-Predicted Pre % 64  73   FEV1-Post L 1.69  1.91   DLCO uncorrected ml/min/mmHg 14.05  20.26   DLCO UNC% % 65  93   DLVA Predicted % 85  112   TLC L 5.26  5.51   TLC % Predicted % 93  97   RV % Predicted % 115  112    PFT 04/27/22 reviewed by me with nonspecific ventilatory defect, mildly reduced diffusing capacity    Echocardiogram 12/06/22:   1. Left ventricular ejection fraction, by estimation, is 70 to 75%. The  left ventricle has hyperdynamic function. The left ventricle has no  regional wall motion abnormalities. There is moderate left ventricular  hypertrophy. Left ventricular diastolic  parameters are indeterminate.   2. Resting peak LVOT gradient 84 mmHg   3. Right ventricular systolic function is normal. The right ventricular  size is normal. Tricuspid regurgitation signal is inadequate for assessing  PA pressure.   4. Left atrial size  was mildly dilated.   5. A small pericardial effusion is present.   6. The mitral valve is degenerative. Trivial mitral valve regurgitation.  Severe mitral stenosis. The mean mitral valve gradient is 16.0 mmHg with  average heart rate of 82 bpm. Severe mitral annular calcification.   7. The aortic valve has been repaired/replaced. Aortic valve  regurgitation is not visualized. There is a 23 mm Edwards Sapien  prosthetic (TAVR) valve present in the aortic position. Procedure Date:  05/14/22. 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 Although she doesn't have obstruction on PFT and her air trapping on HRCT  could just be due to obesity, she does have emphysema and felt that it was worth trial of inhaler. Surprisingly she has had most robust response to ICS - perhaps there is component of reactive airways post covid or asthma. Her pattern of emphysema is interesting and almost looks like LAM but is probably just due to smoking and with her relatively limited symptoms I'm not sure how much value testing for VEGF-D would confer over and above a good controller inhaler. I don't see any clear evidence of amiodarone related lung injury. She remains apprehensive about any inhaler that may worsen her control of AF.   Plan: - I will price out non-DPI ICS that can be used with spacer to minimize risk of thrush. She used qvar to good effect but had to quit due to thrush.  - xopenex 1-2 puffs as needed is your rescue inhaler - I think it would be ok to give this a shot but call us if you have issues with heart rate afterward - see you in 3 months or sooner if need be!     Omar Person,  MD [facility] 05/28/2023 12:35 PM

## 2023-05-28 NOTE — Patient Instructions (Signed)
Medication Instructions:   REDUCE TOPROL TO ONCE DAILY  *If you need a refill on your cardiac medications before your next appointment, please call your pharmacy*   Follow-Up: At [facility], you and your health needs are our priority.  As part of our continuing mission to provide you with exceptional heart care, we have created designated Provider Care Teams.  These Care Teams include your primary Cardiologist (physician) and Advanced Practice Providers (APPs -  Physician Assistants and Nurse Practitioners) who all work together to provide you with the care you need, when you need it.  We recommend signing up for the patient portal called "MyChart".  Sign up information is provided on this After Visit Summary.  MyChart is used to connect with patients for Virtual Visits (Telemedicine).  Patients are able to view lab/test results, encounter notes, upcoming appointments, etc.  Non-urgent messages can be sent to your provider as well.   To learn more about what you can do with MyChart, go to [email or web address].    Your next appointment:   12 month(s)  Provider:   Rollene Rotunda, MD

## 2023-05-30 ENCOUNTER — Encounter: Payer: Self-pay | Admitting: Student

## 2023-05-30 ENCOUNTER — Ambulatory Visit: Payer: Medicare Other | Admitting: Student

## 2023-05-30 VITALS — BP 114/68 | HR 56 | Temp 98.2°F | Ht 68.0 in | Wt 254.0 lb

## 2023-05-30 DIAGNOSIS — R053 Chronic cough: Secondary | ICD-10-CM

## 2023-05-30 DIAGNOSIS — R0609 Other forms of dyspnea: Secondary | ICD-10-CM

## 2023-05-30 MED ORDER — BECLOMETHASONE DIPROP HFA 80 MCG/ACT IN AERB: 2.0000 | INHALATION_SPRAY | Freq: Two times a day (BID) | RESPIRATORY_TRACT | 11 refills | Status: DC

## 2023-05-30 MED ORDER — SPIRIVA RESPIMAT 1.25 MCG/ACT IN AERS: 2.0000 | INHALATION_SPRAY | Freq: Every day | RESPIRATORY_TRACT | 11 refills | Status: DC

## 2023-05-30 NOTE — Patient Instructions (Addendum)
-   qvar 2puff twice daily - spiriva 2 puffs daily - xopenex 1-2 puffs as needed is your rescue inhaler - I think it would be ok to give this a shot but call us if you have issues with heart rate afterward - see you in 6 months with Dr. Everardo All or sooner if need be!

## 2023-06-14 ENCOUNTER — Other Ambulatory Visit: Payer: Self-pay | Admitting: Cardiology

## 2023-06-16 ENCOUNTER — Encounter: Payer: Self-pay | Admitting: Cardiology

## 2023-06-17 MED ORDER — FUROSEMIDE 40 MG PO TABS: 40.0000 mg | ORAL_TABLET | Freq: Every day | ORAL | 3 refills | Status: DC

## 2023-08-06 ENCOUNTER — Emergency Department (HOSPITAL_BASED_OUTPATIENT_CLINIC_OR_DEPARTMENT_OTHER)
Admission: EM | Admit: 2023-08-06 | Discharge: 2023-08-06 | Disposition: A | Discharge status: Home / Self Care | Payer: Medicare Other | Attending: Emergency Medicine | Admitting: Emergency Medicine

## 2023-08-06 ENCOUNTER — Telehealth (HOSPITAL_COMMUNITY): Payer: Self-pay

## 2023-08-06 ENCOUNTER — Encounter (HOSPITAL_BASED_OUTPATIENT_CLINIC_OR_DEPARTMENT_OTHER): Payer: Self-pay | Admitting: Emergency Medicine

## 2023-08-06 DIAGNOSIS — Z7982 Long term (current) use of aspirin: Secondary | ICD-10-CM | POA: Insufficient documentation

## 2023-08-06 DIAGNOSIS — R0602 Shortness of breath: Secondary | ICD-10-CM | POA: Diagnosis present

## 2023-08-06 DIAGNOSIS — I4892 Unspecified atrial flutter: Secondary | ICD-10-CM | POA: Diagnosis not present

## 2023-08-06 LAB — CBC WITH DIFFERENTIAL/PLATELET
Abs Immature Granulocytes: 0.04 10*3/uL (ref 0.00–0.07)
Basophils Absolute: 0 10*3/uL (ref 0.0–0.1)
Basophils Relative: 0 %
Eosinophils Absolute: 0.1 10*3/uL (ref 0.0–0.5)
Eosinophils Relative: 1 %
HCT: 44.2 % (ref 36.0–46.0)
Hemoglobin: 14 g/dL (ref 12.0–15.0)
Immature Granulocytes: 0 %
Lymphocytes Relative: 26 %
Lymphs Abs: 2.4 10*3/uL (ref 0.7–4.0)
MCH: 26.9 pg (ref 26.0–34.0)
MCHC: 31.7 g/dL (ref 30.0–36.0)
MCV: 85 fL (ref 80.0–100.0)
Monocytes Absolute: 0.8 10*3/uL (ref 0.1–1.0)
Monocytes Relative: 9 %
Neutro Abs: 5.8 10*3/uL (ref 1.7–7.7)
Neutrophils Relative %: 64 %
Platelets: 249 10*3/uL (ref 150–400)
RBC: 5.2 MIL/uL — ABNORMAL HIGH (ref 3.87–5.11)
RDW: 19.1 % — ABNORMAL HIGH (ref 11.5–15.5)
WBC: 9.2 10*3/uL (ref 4.0–10.5)
nRBC: 0 % (ref 0.0–0.2)

## 2023-08-06 LAB — BASIC METABOLIC PANEL
Anion gap: 13 (ref 5–15)
BUN: 17 mg/dL (ref 8–23)
CO2: 23 mmol/L (ref 22–32)
Calcium: 9.3 mg/dL (ref 8.9–10.3)
Chloride: 106 mmol/L (ref 98–111)
Creatinine, Ser: 0.82 mg/dL (ref 0.44–1.00)
GFR, Estimated: >60 mL/min (ref >60)
Glucose, Bld: 163 mg/dL — ABNORMAL HIGH (ref 70–99)
Potassium: 3.9 mmol/L (ref 3.5–5.1)
Sodium: 142 mmol/L (ref 135–145)

## 2023-08-06 LAB — MAGNESIUM: Magnesium: 2.2 mg/dL (ref 1.7–2.4)

## 2023-08-06 MED ORDER — METOPROLOL TARTRATE 5 MG/5ML IV SOLN: 5.0000 mg | Freq: Once | INTRAVENOUS | Status: DC

## 2023-08-06 MED ORDER — SODIUM CHLORIDE 0.9 % IV BOLUS
500.0000 mL | Freq: Once | INTRAVENOUS | Status: AC
  Administered 2023-08-06: 500 mL via INTRAVENOUS

## 2023-08-06 NOTE — Discharge Instructions (Signed)
Of your cardiologist in the office.  Please return for recurrent or persistent symptoms.

## 2023-08-06 NOTE — ED Triage Notes (Signed)
Palpitations that started while lying in bed tonight. H/o afib, not on a blood thinner but has been taking her prescribed carvedilol. Denies recent viral illness.  Denies SOB beyond baseline, CP, fever

## 2023-08-06 NOTE — Telephone Encounter (Signed)
Called to offer ED follow up appointment to be seen at the [facility]. Patient declined appointment at this time.

## 2023-08-06 NOTE — ED Provider Notes (Signed)
[place] [facility] Provider Note   CSN: [id number] Arrival date & time: 08/06/23  4098     History  Chief Complaint  Patient presents with   Palpitations    Victoria Delgado is a 82 y.o. female.  82 yo F with a chief complaints of feeling like her heart is beating funny.  The patient's unfortunately has had issues with this in the past.  She is not on anticoagulation because she has had 2 different bleeding events.  She tells me that she was sitting up forcefully in bed and was right after that felt like her heart started racing.  That was within an hour ago.  She otherwise has been doing fine.  She said the last time she went into atrial fibrillation it was due to an illness.  Feels like she has been eating and drinking normally.  She was started on Jardiance this month otherwise denies any new medications.  She denies chest pain or difficulty breathing denies abdominal pain.   Palpitations      Home Medications Prior to Admission medications   Medication Sig Start Date End Date Taking? Authorizing Provider  ACETAMINOPHEN PO Take 650 mg by mouth daily as needed for moderate pain or headache.    [provider]  aspirin EC 81 MG tablet Take 81 mg by mouth daily. 02/29/16   [provider]  atropine 1 % ophthalmic solution Place 1 drop into the right eye daily.    [provider]  beclomethasone (QVAR) 80 MCG/ACT inhaler Inhale 2 puffs into the lungs 2 (two) times daily. 05/30/23   Omar Person, MD  cholecalciferol (VITAMIN D3) 25 MCG (1000 UNIT) tablet Take 1,000 Units by mouth 2 (two) times daily.    [provider]  Coenzyme Q10 (COQ10) 100 MG CAPS Take 100 mg by mouth daily.    [provider]  CRESTOR 20 MG tablet TAKE 1 TABLET(20 MG) BY MOUTH DAILY 01/30/23   Rollene Rotunda, MD  ezetimibe (ZETIA) 10 MG tablet TAKE 1 TABLET(10 MG) BY MOUTH DAILY 02/25/23   Rollene Rotunda, MD  furosemide (LASIX)  40 MG tablet Take 1 tablet (40 mg total) by mouth daily. 06/17/23   Rollene Rotunda, MD  levalbuterol [facility] HFA) 45 MCG/ACT inhaler Inhale 1-2 puffs into the lungs every 6 (six) hours as needed for wheezing. 06/26/22   Omar Person, MD  loteprednol (LOTEMAX) 0.5 % ophthalmic suspension Place 1 drop into both eyes daily. 11/02/19   [provider]  metoprolol succinate (TOPROL XL) 50 MG 24 hr tablet Take 1 tablet (50 mg total) by mouth daily. Take with or immediately following a meal. 05/28/23   Rollene Rotunda, MD  Multiple Vitamin (MULTIVITAMIN) tablet Take 1 tablet by mouth 2 (two) times a week.    [provider]  nitroGLYCERIN (NITROSTAT) 0.4 MG SL tablet Place 1 tablet (0.4 mg total) under the tongue every 5 (five) minutes as needed for chest pain. 05/07/16   Rosalio Macadamia, NP  omeprazole (PRILOSEC) 20 MG capsule Take 20 mg by mouth 2 (two) times daily before a meal.    [provider]  Propylene Glycol (SYSTANE BALANCE) 0.6 % SOLN Place 1 drop into both eyes 2 (two) times daily as needed (dry eyes).    [provider]  Tiotropium Bromide Monohydrate (SPIRIVA RESPIMAT) 1.25 MCG/ACT AERS Inhale 2 puffs into the lungs daily. 05/30/23   Omar Person, MD  Allergies    Codeine, Kenalog [triamcinolone acetonide], Kenalog [triamcinolone], Pantoprazole, Relafen [nabumetone], Albuterol, Epinephrine, Penicillins, and Tramadol    Review of Systems   Review of Systems  Cardiovascular:  Positive for palpitations.    Physical Exam Updated Vital Signs BP 138/78 (BP Location: Right Arm)   Pulse (!) 115   Temp 98.6 F (37 C)   Resp 17   SpO2 100%  Physical Exam Vitals and nursing note reviewed.  Constitutional:      General: She is not in acute distress.    Appearance: She is well-developed. She is not diaphoretic.  HENT:     Head: Normocephalic and atraumatic.  Eyes:     Pupils: Pupils are equal, round, and reactive to light.   Cardiovascular:     Rate and Rhythm: Regular rhythm. Tachycardia present.     Heart sounds: No murmur heard.    No friction rub. No gallop.  Pulmonary:     Effort: Pulmonary effort is normal.     Breath sounds: No wheezing or rales.  Abdominal:     General: There is no distension.     Palpations: Abdomen is soft.     Tenderness: There is no abdominal tenderness.  Musculoskeletal:        General: No tenderness.     Cervical back: Normal range of motion and neck supple.  Skin:    General: Skin is warm and dry.  Neurological:     Mental Status: She is alert and oriented to person, place, and time.  Psychiatric:        Behavior: Behavior normal.     ED Results / Procedures / Treatments   Labs (all labs ordered are listed, but only abnormal results are displayed) Labs Reviewed  CBC WITH DIFFERENTIAL/PLATELET - Abnormal; Notable for the following components:      Result Value   RBC 5.20 (*)    RDW 19.1 (*)    All other components within normal limits  BASIC METABOLIC PANEL - Abnormal; Notable for the following components:   Glucose, Bld 163 (*)    All other components within normal limits  MAGNESIUM    EKG EKG Interpretation Date/Time:  Tuesday August 06 2023 05:28:57 EDT Ventricular Rate:  116 PR Interval:  304 QRS Duration:  94 QT Interval:  393 QTC Calculation: 546 R Axis:   217  Text Interpretation: Sinus tachycardia vs afib Prolonged PR interval Biatrial enlargement Right axis deviation Abnormal R-wave progression, late transition Minimal ST depression, diffuse leads Prolonged QT interval No significant change since last tracing Confirmed by Melene Plan [phone]) on 08/06/2023 5:31:11 AM  Radiology No results found.  Procedures .1-3 Lead EKG Interpretation  Performed by: Melene Plan, DO Authorized by: Melene Plan, DO     ECG rate:  65   ECG rate assessment: normal     Rhythm: sinus rhythm     Ectopy: none     Conduction: normal       Medications Ordered  in ED Medications  sodium chloride 0.9 % bolus 500 mL (has no administration in time range)    ED Course/ Medical Decision Making/ A&P                                 Medical Decision Making Amount and/or Complexity of Data Reviewed Labs: ordered. ECG/medicine tests: ordered.  Risk Prescription drug management.   82 yo F with a chief complaints of feeling like  her heart is racing.  Patient's heart rate stopped between 113 and 115 on the monitor.  I wonder if she is in atrial flutter with a fixed block.  Will check electrolytes, bolus of IV fluids.  Bolus dose of metoprolol.  Reassess.  Patient converted spontaneously.  Heart rate now in the 60s.  Normal sinus.  Will have her follow-up with her cardiologist in the office.  5:59 AM:  I have discussed the diagnosis/risks/treatment options with the patient and family.  Evaluation and diagnostic testing in the emergency department does not suggest an emergent condition requiring admission or immediate intervention beyond what has been performed at this time.  They will follow up with Cards. We also discussed returning to the ED immediately if new or worsening sx occur. We discussed the sx which are most concerning (e.g., sudden worsening pain, fever, inability to tolerate by mouth) that necessitate immediate return. Medications administered to the patient during their visit and any new prescriptions provided to the patient are listed below.  Medications given during this visit Medications  sodium chloride 0.9 % bolus 500 mL (has no administration in time range)     The patient appears reasonably screen and/or stabilized for discharge and I doubt any other medical condition or other [facility] requiring further screening, evaluation, or treatment in the ED at this time prior to discharge.          Final Clinical Impression(s) / ED Diagnoses Final diagnoses:  Atrial flutter, unspecified type (HCC)    Rx / DC Orders ED Discharge Orders           Ordered    Ambulatory referral to Cardiology       Comments: If you have not heard from the Cardiology office within the next 72 hours please call [phone].   08/06/23 0557              Melene Plan, DO 08/06/23 0559

## 2023-08-12 ENCOUNTER — Emergency Department (HOSPITAL_BASED_OUTPATIENT_CLINIC_OR_DEPARTMENT_OTHER)
Admission: EM | Admit: 2023-08-12 | Discharge: 2023-08-12 | Disposition: A | Discharge status: Home / Self Care | Payer: Medicare Other

## 2023-08-12 ENCOUNTER — Encounter (HOSPITAL_BASED_OUTPATIENT_CLINIC_OR_DEPARTMENT_OTHER): Payer: Self-pay | Admitting: Urology

## 2023-08-12 ENCOUNTER — Other Ambulatory Visit: Payer: Self-pay

## 2023-08-12 ENCOUNTER — Emergency Department (HOSPITAL_BASED_OUTPATIENT_CLINIC_OR_DEPARTMENT_OTHER): Payer: Medicare Other

## 2023-08-12 DIAGNOSIS — I480 Paroxysmal atrial fibrillation: Secondary | ICD-10-CM | POA: Insufficient documentation

## 2023-08-12 DIAGNOSIS — Z7982 Long term (current) use of aspirin: Secondary | ICD-10-CM | POA: Insufficient documentation

## 2023-08-12 DIAGNOSIS — R000 Tachycardia, unspecified: Secondary | ICD-10-CM | POA: Diagnosis present

## 2023-08-12 LAB — CBC
HCT: 43.9 % (ref 36.0–46.0)
Hemoglobin: 14.2 g/dL (ref 12.0–15.0)
MCH: 27.7 pg (ref 26.0–34.0)
MCHC: 32.3 g/dL (ref 30.0–36.0)
MCV: 85.7 fL (ref 80.0–100.0)
Platelets: 252 10*3/uL (ref 150–400)
RBC: 5.12 MIL/uL — ABNORMAL HIGH (ref 3.87–5.11)
RDW: 19.6 % — ABNORMAL HIGH (ref 11.5–15.5)
WBC: 8 10*3/uL (ref 4.0–10.5)
nRBC: 0.3 % — ABNORMAL HIGH (ref 0.0–0.2)

## 2023-08-12 LAB — BASIC METABOLIC PANEL
Anion gap: 9 (ref 5–15)
BUN: 13 mg/dL (ref 8–23)
CO2: 24 mmol/L (ref 22–32)
Calcium: 8.9 mg/dL (ref 8.9–10.3)
Chloride: 107 mmol/L (ref 98–111)
Creatinine, Ser: 0.81 mg/dL (ref 0.44–1.00)
GFR, Estimated: >60 mL/min (ref >60)
Glucose, Bld: 128 mg/dL — ABNORMAL HIGH (ref 70–99)
Potassium: 4.3 mmol/L (ref 3.5–5.1)
Sodium: 140 mmol/L (ref 135–145)

## 2023-08-12 LAB — TROPONIN I (HIGH SENSITIVITY)
Troponin I (High Sensitivity): 18 ng/L — ABNORMAL HIGH (ref <18)
Troponin I (High Sensitivity): 19 ng/L — ABNORMAL HIGH (ref <18)

## 2023-08-12 MED ORDER — METOPROLOL TARTRATE 5 MG/5ML IV SOLN
5.0000 mg | Freq: Once | INTRAVENOUS | Status: AC
  Administered 2023-08-12: 5 mg via INTRAVENOUS
  Filled 2023-08-12: qty 5

## 2023-08-12 MED ORDER — SODIUM CHLORIDE 0.9 % IV BOLUS
250.0000 mL | Freq: Once | INTRAVENOUS | Status: AC
  Administered 2023-08-12: 250 mL via INTRAVENOUS

## 2023-08-12 NOTE — ED Provider Notes (Signed)
[place] [facility] Provider Note   CSN: [id number] Arrival date & time: 08/12/23  1414     History  Chief Complaint  Patient presents with   Atrial Fibrillation    Victoria Delgado is a 82 y.o. female.  Patient complains of a rapid heartbeat.  Patient reports that she has a history of atrial fibrillation.  Patient reports she had an episode earlier this month.  She was called by the A-fib clinic and felt like she did not need follow-up because it had resolved.  Patient reports she has had A-fib in the past but it is usually related to being dehydrated or illness.  Patient recently started on already since.  Patient is concerned that her atrial fibs may be caused by the Jardiance.  Patient reports she feels some discomfort in her chest.  Patient reports that she has the feeling of her heart beating fast.  Patient reports that she feels it when she goes into atrial fibs.  Patient reports that she has had atrial fibrillation in the past when she had COVID and when she had RSV.  The history is provided by the spouse. No language interpreter was used.  Atrial Fibrillation This is a new problem. Pertinent negatives include no chest pain and no shortness of breath. Nothing aggravates the symptoms. She has tried nothing for the symptoms.       Home Medications Prior to Admission medications   Medication Sig Start Date End Date Taking? Authorizing Provider  ACETAMINOPHEN PO Take 650 mg by mouth daily as needed for moderate pain or headache.    [provider]  aspirin EC 81 MG tablet Take 81 mg by mouth daily. 02/29/16   [provider]  atropine 1 % ophthalmic solution Place 1 drop into the right eye daily.    [provider]  beclomethasone (QVAR) 80 MCG/ACT inhaler Inhale 2 puffs into the lungs 2 (two) times daily. 05/30/23   Omar Person, MD  cholecalciferol (VITAMIN D3) 25 MCG (1000 UNIT) tablet Take 1,000 Units by mouth 2  (two) times daily.    [provider]  Coenzyme Q10 (COQ10) 100 MG CAPS Take 100 mg by mouth daily.    [provider]  CRESTOR 20 MG tablet TAKE 1 TABLET(20 MG) BY MOUTH DAILY 01/30/23   Rollene Rotunda, MD  ezetimibe (ZETIA) 10 MG tablet TAKE 1 TABLET(10 MG) BY MOUTH DAILY 02/25/23   Rollene Rotunda, MD  furosemide (LASIX) 40 MG tablet Take 1 tablet (40 mg total) by mouth daily. 06/17/23   Rollene Rotunda, MD  levalbuterol [facility] HFA) 45 MCG/ACT inhaler Inhale 1-2 puffs into the lungs every 6 (six) hours as needed for wheezing. 06/26/22   Omar Person, MD  loteprednol (LOTEMAX) 0.5 % ophthalmic suspension Place 1 drop into both eyes daily. 11/02/19   [provider]  metoprolol succinate (TOPROL XL) 50 MG 24 hr tablet Take 1 tablet (50 mg total) by mouth daily. Take with or immediately following a meal. 05/28/23   Rollene Rotunda, MD  Multiple Vitamin (MULTIVITAMIN) tablet Take 1 tablet by mouth 2 (two) times a week.    [provider]  nitroGLYCERIN (NITROSTAT) 0.4 MG SL tablet Place 1 tablet (0.4 mg total) under the tongue every 5 (five) minutes as needed for chest pain. 05/07/16   Rosalio Macadamia, NP  omeprazole (PRILOSEC) 20 MG capsule Take 20 mg by mouth 2 (two) times daily before a meal.    [provider]  Propylene Glycol (SYSTANE BALANCE) 0.6 % SOLN Place 1 drop into both eyes 2 (two) times daily as needed (dry eyes).    [provider]  Tiotropium Bromide Monohydrate (SPIRIVA RESPIMAT) 1.25 MCG/ACT AERS Inhale 2 puffs into the lungs daily. 05/30/23   Omar Person, MD      Allergies    Codeine, Kenalog [triamcinolone acetonide], Kenalog [triamcinolone], Pantoprazole, Relafen [nabumetone], Albuterol, Epinephrine, Penicillins, and Tramadol    Review of Systems   Review of Systems  Respiratory:  Negative for chest tightness and shortness of breath.   Cardiovascular:  Positive for palpitations. Negative for chest pain.  All  other systems reviewed and are negative.   Physical Exam Updated Vital Signs BP 107/68   Pulse 81   Temp 98 F (36.7 C)   Resp 14   Ht 5\' 8"  (1.727 m)   Wt 115.2 kg   SpO2 96%   BMI 38.62 kg/m  Physical Exam Vitals and nursing note reviewed.  Constitutional:      Appearance: She is well-developed.  HENT:     Head: Normocephalic.  Cardiovascular:     Rate and Rhythm: Rhythm irregular.  Pulmonary:     Effort: Pulmonary effort is normal.  Abdominal:     General: There is no distension.  Musculoskeletal:        General: Normal range of motion.     Cervical back: Normal range of motion.  Skin:    General: Skin is warm.  Neurological:     General: No focal deficit present.     Mental Status: She is alert and oriented to person, place, and time.  Psychiatric:        Mood and Affect: Mood normal.     ED Results / Procedures / Treatments   Labs (all labs ordered are listed, but only abnormal results are displayed) Labs Reviewed  BASIC METABOLIC PANEL - Abnormal; Notable for the following components:      Result Value   Glucose, Bld 128 (*)    All other components within normal limits  CBC - Abnormal; Notable for the following components:   RBC 5.12 (*)    RDW 19.6 (*)    nRBC 0.3 (*)    All other components within normal limits  TROPONIN I (HIGH SENSITIVITY) - Abnormal; Notable for the following components:   Troponin I (High Sensitivity) 18 (*)    All other components within normal limits  TROPONIN I (HIGH SENSITIVITY) - Abnormal; Notable for the following components:   Troponin I (High Sensitivity) 19 (*)    All other components within normal limits    EKG None  Radiology DG Chest Port 1 View  Result Date: 08/12/2023 CLINICAL DATA:  Atrial fibrillation EXAM: PORTABLE CHEST 1 VIEW COMPARISON:  X-ray 01/15/2023 FINDINGS: Under penetrated radiograph. No consolidation, pneumothorax or effusion. No edema. Film is under penetrated. Hyperinflation. Calcified aorta.  IMPRESSION: Hyperinflation.  Enlarged cardiopericardial silhouette. Electronically Signed   By: Karen Kays M.D.   On: 08/12/2023 16:55    Procedures Procedures    Medications Ordered in ED Medications  metoprolol tartrate (LOPRESSOR) injection 5 mg (5 mg Intravenous Given 08/12/23 1523)  sodium chloride 0.9 % bolus 250 mL (0 mLs Intravenous Stopped 08/12/23 1657)    ED Course/ Medical Decision Making/ A&P                                 Medical  Decision Making Patient complains of atrial fibrillation.  Patient is concerned that it was related to her diabetes medication  Amount and/or Complexity of Data Reviewed Independent Historian: spouse    Details: Patient is here with her husband who is supportive External Data Reviewed: notes.    Details: Review his ED notes and cardiology notes reviewed Labs: ordered. Decision-making details documented in ED Course.    Details: Labs ordered reviewed and interpreted.  Troponin is 17 and 18. Radiology: ordered and independent interpretation performed. Decision-making details documented in ED Course.    Details: Chest x-ray shows hyper aeration otherwise no acute findings ECG/medicine tests: ordered and independent interpretation performed. Decision-making details documented in ED Course.    Details: EKG shows a atrial fibrillation with RVR at 146.  Risk Prescription drug management. Risk Details: Patient given IV fluids x 250 cc.  Patient is given metoprolol 5 mg.  Patient has had a decrease in her heart rhythm to the 80s and 90s.  Patient fluctuates between normal sinus and atrial fibrillation.  Dr. Jacqulyn Bath is in to see and examine.  Patient is given a referral to cardiology.  She is advised to please keep the appointment.           Final Clinical Impression(s) / ED Diagnoses Final diagnoses:  PAF (paroxysmal atrial fibrillation) (HCC)    Rx / DC Orders ED Discharge Orders          Ordered    Ambulatory referral to Cardiology        Comments: If you have not heard from the Cardiology office within the next 72 hours please call [phone].   08/12/23 1843           An After Visit Summary was printed and given to the patient.    Osie Cheeks 08/12/23 1857    Maia Plan, MD 08/12/23 2105

## 2023-08-12 NOTE — ED Triage Notes (Addendum)
Pt states return of Afib that started this am at 0100, pt not on blood thinners  Reports SOB intemittent as well

## 2023-08-13 ENCOUNTER — Telehealth: Payer: Self-pay | Admitting: Cardiology

## 2023-08-13 NOTE — Telephone Encounter (Signed)
Spoke with patient and set for tomorrow with the PA [place].  She does not understand why she has to wait until tomorrow when "I was told by the doctor to call immedietly iof any issues".  Advised yes we do not want you to cal but the doctor is not always available but a PA or NP can see her.  Also see she was to go to AFIB clinic and cancelled, asked if she preferred to go to there first and she states no because she has several issues.  Appt made for 9/11

## 2023-08-13 NOTE — Telephone Encounter (Signed)
Patient has been to the ER twice this week for Afib and patient is wanting to see Dr. Antoine Poche today. Explain dont have nothing till 9/20 but stated that's too far away. Please advise

## 2023-08-14 ENCOUNTER — Encounter: Payer: Self-pay | Admitting: Physician Assistant

## 2023-08-14 ENCOUNTER — Ambulatory Visit: Payer: Medicare Other | Attending: Physician Assistant | Admitting: Physician Assistant

## 2023-08-14 VITALS — BP 128/78 | HR 145 | Ht 68.0 in | Wt 270.0 lb

## 2023-08-14 DIAGNOSIS — I517 Cardiomegaly: Secondary | ICD-10-CM | POA: Diagnosis not present

## 2023-08-14 DIAGNOSIS — I480 Paroxysmal atrial fibrillation: Secondary | ICD-10-CM | POA: Diagnosis not present

## 2023-08-14 DIAGNOSIS — Z952 Presence of prosthetic heart valve: Secondary | ICD-10-CM

## 2023-08-14 DIAGNOSIS — E785 Hyperlipidemia, unspecified: Secondary | ICD-10-CM

## 2023-08-14 DIAGNOSIS — I2510 Atherosclerotic heart disease of native coronary artery without angina pectoris: Secondary | ICD-10-CM

## 2023-08-14 DIAGNOSIS — I340 Nonrheumatic mitral (valve) insufficiency: Secondary | ICD-10-CM

## 2023-08-14 DIAGNOSIS — I10 Essential (primary) hypertension: Secondary | ICD-10-CM

## 2023-08-14 MED ORDER — APIXABAN 5 MG PO TABS: 5.0000 mg | ORAL_TABLET | Freq: Two times a day (BID) | ORAL | 5 refills | Status: DC

## 2023-08-14 MED ORDER — METOPROLOL SUCCINATE ER 50 MG PO TB24
50.0000 mg | ORAL_TABLET | Freq: Two times a day (BID) | ORAL | 5 refills | Status: DC
Start: 2023-08-14 — End: 2023-08-20

## 2023-08-14 NOTE — Patient Instructions (Signed)
Medication Instructions:  STOP ASPIRIN 81 MG  START ELIQUIS 5 MG TWICE DAILY.  INCREASE METOPROLOL SUCCINATE TO 50 MG TWICE DAILY.  *If you need a refill on your cardiac medications before your next appointment, please call your pharmacy*   Lab Work: NO LABS If you have labs (blood work) drawn today and your tests are completely normal, you will receive your results only by: MyChart Message (if you have MyChart) OR A paper copy in the mail If you have any lab test that is abnormal or we need to change your treatment, we will call you to review the results.   Testing/Procedures: NO TESTING   Follow-Up: At [facility], you and your health needs are our priority.  As part of our continuing mission to provide you with exceptional heart care, we have created designated Provider Care Teams.  These Care Teams include your primary Cardiologist (physician) and Advanced Practice Providers (APPs -  Physician Assistants and Nurse Practitioners) who all work together to provide you with the care you need, when you need it.     Your next appointment:   FOLLOW UP August 20 2023 AT 3:35PM  Provider:   Azalee Course, PA

## 2023-08-14 NOTE — Progress Notes (Addendum)
Cardiology Office Note:  .   Date:  08/14/2023  ID:  Victoria Delgado, DOB 08/26/1941, MRN [id number] PCP: Charlane Ferretti, DO  [facility] Providers Cardiologist:  Rollene Rotunda, MD     History of Present Illness: .   Victoria Delgado is a 82 y.o. female with PMH of CAD, asymmetric septal hypertrophy, A-fib, history AS s/p TAVR, mitral stenosis, sleep apnea, hypertension and hyperlipidemia.  Patient was admitted at [facility] in May 2017 with left arm pain.  She had a positive troponin and underwent PCI of left circumflex and the first diagonal.  She was discharged on aspirin and Effient.  Echocardiogram showed preserved EF but moderate aortic stenosis.  She underwent cardiac catheterization again in April 2019 for chest pain, this showed patent stent in the proximal to mid left circumflex artery, 20% ostial left circumflex artery lesion, 40% OM 3 lesion, widely patent stent in ostial D1, 20% mid LAD lesion.  Cardiac catheterization did confirm moderate aortic stenosis with mean gradient 26.7 mmHg.  Medical therapy was recommended.  She eventually underwent TAVR at [facility] as her aortic stenosis progressed to severe range.  Patient was admitted in December with COVID and had atrial fibrillation with RVR.  She was treated with IV amiodarone and sent home on oral amiodarone and anticoagulation therapy.  Echocardiogram obtained during the admission showed normal TAVR gradient with moderate mitral stenosis.    Patient was readmitted in January 2024 due to acute respiratory failure in the setting of RSV and a COPD exacerbation.  She had elevated troponin up to 1300.  Initial plan was for left and right heart cath, however patient was complaining of abdominal pain.  Subsequent CT showed a large intramuscular hemorrhage in the right rectus sheath.  Eliquis was discontinued.  She was switched to aspirin.  She has not had any further A-fib.  Most recent echocardiogram obtained on 04/25/2023 showed  stable TAVR valve, hyperdynamic LV function, moderate mitral stenosis with mean gradient 9 mmHg.  Since the recent visit, patient presented to the ED twice on 08/06/2023 and again on 08/12/2023 with palpitation.  According to ED visit on 9/3, she spontaneously converted back to sinus rhythm.  She remained in A-fib following ED visit on 9/9.  Patient presents today for cardiology follow-up.  She remains in A-fib with RVR with heart rate in the 140s.  She has shortness of breath with walking.  On physical exam, she appears to be euvolemic.  She has no lower extremity edema.  Her lung is clear.  We discussed signs and symptoms of worsening heart failure symptoms which she should monitor.  I recommended reinitiation of Eliquis 5 mg twice a day.  I will discontinue her aspirin.  I also again recommended increase her metoprolol succinate from 50 mg daily to 50 mg twice a day.  I discussed her case with DOD Dr. Rennis Golden who was agreeable.  I planned with bring the patient back next Tuesday for close outpatient follow-up.  If heart rates remain uncontrolled, I will arrange for TEE cardioversion.  ROS:   Patient complains of shortness of breath with exertion, she denies chest pain, orthopnea or PND.  Studies Reviewed: .        Cardiac Studies & Procedures   CARDIAC CATHETERIZATION  CARDIAC CATHETERIZATION 07/11/2020  Narrative  Non-stenotic Ost 1st Diag to 1st Diag lesion was previously treated.  Ost LAD to Prox LAD lesion is 20% stenosed.  Mid LAD lesion is 20% stenosed.  Ost 3rd  Mrg lesion is 40% stenosed.  Non-stenotic Prox Cx to Mid Cx lesion was previously treated.  Ost Cx to Prox Cx lesion is 20% stenosed.  Mid RCA lesion is 10% stenosed.  Ost LM lesion is 30% stenosed.  There is severe aortic valve stenosis.  1.  Nonobstructive coronary artery disease with continued patency of the stented segments in the first diagonal and mid circumflex 2.  Critical aortic stenosis with a mean gradient of  79 mmHg and calculated valve area of 0.7 cm 3.  Moderate pulmonary hypertension with mean PA pressure 38 mmHg, mean wedge pressure 27 mmHg, PVR 1.6 Wood units (likely secondary to left heart disease)  Plan: Continued multidisciplinary heart team evaluation for aortic valve replacement  Findings Coronary Findings Diagnostic  Dominance: Right  Left Main Ost LM lesion is 30% stenosed. Mild pressure dampening with catheter in place  Left Anterior Descending The LAD is widely patent and it wraps around the LV apex.  The diagonal stent is widely patent with no restenosis. Ost LAD to Prox LAD lesion is 20% stenosed. Mid LAD lesion is 20% stenosed.  First Diagonal Branch Vessel is moderate in size. Non-stenotic Ost 1st Diag to 1st Diag lesion was previously treated.  Second Diagonal Branch Vessel is small in size.  Left Circumflex The circumflex is patent with a widely patent stent in the mid vessel and moderate ostial stenosis of the OM subbranch unchanged from the previous study Ost Cx to Prox Cx lesion is 20% stenosed. Non-stenotic Prox Cx to Mid Cx lesion was previously treated.  First Obtuse Marginal Branch Vessel is small in size.  Third Obtuse Marginal Branch Vessel is moderate in size. Ost 3rd Mrg lesion is 40% stenosed.  Right Coronary Artery Vessel is large. The vessel exhibits minimal luminal irregularities. Large, dominant vessel with minimal luminal irregularities Mid RCA lesion is 10% stenosed.  Intervention  No interventions have been documented.   CARDIAC CATHETERIZATION  CARDIAC CATHETERIZATION 03/19/2018  Narrative  Mid RCA lesion is 10% stenosed.  Previously placed Prox Cx to Mid Cx stent (unknown type) is widely patent.  Ost Cx to Prox Cx lesion is 20% stenosed.  Ost 3rd Mrg lesion is 40% stenosed.  Previously placed Ost 1st Diag to 1st Diag stent (unknown type) is widely patent.  Ost LAD to Prox LAD lesion is 20% stenosed.  Mid LAD lesion is  20% stenosed.  There is moderate aortic valve stenosis.  1. The left main has no disease. 2. The LAD is large vessel that courses to the apex. There is mild plaque in the proximal and mid LAD. The Diagonal branch has a patent stent with no restenosis. 3. The Circumflex is a large caliber vessel with a patent mid to distal stent with no restenosis. The obtuse marginal branch is jailed by the stent with 40% ostial stenosis. 4. The RCA is a large dominant vessel with mild plaque 5. There is moderate aortic stenosis (mean gradient 26.7 mmHg, peak to peak gradient 29 mmHg). I easily crossed the valve with a JR4 catheter. 6. False positive stress test  Recommendations: Continue medical management of CAD. Follow her aortic stenosis for now as it appears to be moderate.  Findings Coronary Findings Diagnostic  Dominance: Right  Left Anterior Descending Ost LAD to Prox LAD lesion is 20% stenosed. Mid LAD lesion is 20% stenosed.  First Diagonal Branch Vessel is moderate in size. Previously placed Ost 1st Diag to 1st Diag stent (unknown type) is widely patent.  Second Diagonal  Branch Vessel is small in size.  Left Circumflex Ost Cx to Prox Cx lesion is 20% stenosed. Previously placed Prox Cx to Mid Cx stent (unknown type) is widely patent.  First Obtuse Marginal Branch Vessel is small in size.  Third Obtuse Marginal Branch Vessel is moderate in size. Ost 3rd Mrg lesion is 40% stenosed.  Right Coronary Artery Vessel is large. Mid RCA lesion is 10% stenosed.  Intervention  No interventions have been documented.   STRESS TESTS  NM MYOCAR MULTI W/SPECT W 03/18/2018  Narrative  There was no ST segment deviation noted during stress.  Nuclear stress EF: 64%. No significant wall motion abnormalities detected  No T wave inversion was noted during stress.  Defect 1: There is a large defect of severe severity present in the basal anterior, basal anteroseptal, basal inferoseptal,  mid anterior, mid anteroseptal, mid inferoseptal and apex location.  Findings consistent with ischemia.  This is a high risk study.  Donato Schultz, MD   ECHOCARDIOGRAM  ECHOCARDIOGRAM COMPLETE 12/06/2022  Narrative ECHOCARDIOGRAM REPORT    Patient Name:   MARDI EVAVOLD Date of Exam: 12/06/2022 Medical Rec #:  [id number]     Height:       68.0 in Accession #:    [id number]    Weight:       278.0 lb Date of Birth:  10-16-1941    BSA:          2.351 m Patient Age:    75 years      BP:           109/62 mmHg Patient Gender: F             HR:           84 bpm. Exam Location:  Inpatient  Procedure: 2D Echo, Cardiac Doppler and Color Doppler  Indications:    Other abnormalities of the heart R00.8  History:        Patient has prior history of Echocardiogram examinations, most recent 11/04/2022. CAD and Angina, Arrythmias:Atrial Fibrillation and Tachycardia, Signs/Symptoms:Chest Pain and Shortness of Breath; Risk Factors:Hypertension, Sleep Apnea and Dyslipidemia. Aortic Valve: 23 mm Edwards Sapien prosthetic, stented (TAVR) valve is present in the aortic position. Procedure Date: 05/14/22.  Sonographer:    Lucendia Herrlich Sonographer#2:  Irving Burton Senior Referring Phys: [id number] SARA-MAIZ A Vanloan  IMPRESSIONS   1. Left ventricular ejection fraction, by estimation, is 70 to 75%. The left ventricle has hyperdynamic function. The left ventricle has no regional wall motion abnormalities. There is moderate left ventricular hypertrophy. Left ventricular diastolic parameters are indeterminate. 2. Resting peak LVOT gradient 84 mmHg 3. Right ventricular systolic function is normal. The right ventricular size is normal. Tricuspid regurgitation signal is inadequate for assessing PA pressure. 4. Left atrial size was mildly dilated. 5. A small pericardial effusion is present. 6. The mitral valve is degenerative. Trivial mitral valve regurgitation. Severe mitral stenosis. The mean mitral valve  gradient is 16.0 mmHg with average heart rate of 82 bpm. Severe mitral annular calcification. 7. The aortic valve has been repaired/replaced. Aortic valve regurgitation is not visualized. 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 Procedure Date: 05/14/22. Vmax 3.2 m/s, MG 20 mmHg, EOA 2.2 cm^2, DI 0.58  FINDINGS Left Ventricle: Left ventricular ejection fraction, by estimation, is 70 to 75%. The left ventricle has hyperdynamic function. The left ventricle has no regional wall motion abnormalities. The left ventricular internal cavity size was normal in size. There  is moderate left ventricular hypertrophy. Left ventricular diastolic parameters are indeterminate.  Right Ventricle: The right ventricular size is normal. No increase in right ventricular wall thickness. Right ventricular systolic function is normal. Tricuspid regurgitation signal is inadequate for assessing PA pressure.  Left Atrium: Left atrial size was mildly dilated.  Right Atrium: Right atrial size was normal in size.  Pericardium: A small pericardial effusion is present. Presence of epicardial fat layer.  Mitral Valve: The mitral valve is degenerative in appearance. Severe mitral annular calcification. Trivial mitral valve regurgitation. Severe mitral valve stenosis. MV peak gradient, 30.6 mmHg. The mean mitral valve gradient is 16.0 mmHg with average heart rate of 82 bpm.  Tricuspid Valve: The tricuspid valve is normal in structure. Tricuspid valve regurgitation is trivial.  Aortic Valve: The aortic valve has been repaired/replaced. Aortic valve regurgitation is not visualized. Aortic valve mean gradient measures 20.0 mmHg. Aortic valve peak gradient measures 41.2 mmHg. Aortic valve area, by VTI measures 2.17 cm. There is a 23 mm Edwards Sapien prosthetic, stented (TAVR) valve present in the aortic position. Procedure Date: 05/14/22.  Pulmonic Valve: The pulmonic valve was not well  visualized. Pulmonic valve regurgitation is not visualized.  Aorta: The aortic root and ascending aorta are structurally normal, with no evidence of dilitation.  IAS/Shunts: The interatrial septum was not well visualized.   LEFT VENTRICLE PLAX 2D LVIDd:         4.30 cm   Diastology LVIDs:         2.40 cm   LV e' medial:   5.98 cm/s LV PW:         1.30 cm   LV E/e' medial: 27.1 LV IVS:        1.20 cm LVOT diam:     2.20 cm LV SV:         127 LV SV Index:   54 LVOT Area:     3.80 cm   RIGHT VENTRICLE RV S prime:     11.10 cm/s TAPSE (M-mode): 1.7 cm  LEFT ATRIUM              Index        RIGHT ATRIUM           Index LA Vol (A2C):   120.0 ml 51.05 ml/m  RA Area:     18.00 cm LA Vol (A4C):   69.5 ml  29.56 ml/m  RA Volume:   42.60 ml  18.12 ml/m LA Biplane Vol: 93.8 ml  39.90 ml/m AORTIC VALVE AV Area (Vmax):    3.69 cm AV Area (Vmean):   1.87 cm AV Area (VTI):     2.17 cm AV Vmax:           321.00 cm/s AV Vmean:          199.500 cm/s AV VTI:            0.586 m AV Peak Grad:      41.2 mmHg AV Mean Grad:      20.0 mmHg LVOT Vmax:         312.00 cm/s LVOT Vmean:        98.300 cm/s LVOT VTI:          0.335 m LVOT/AV VTI ratio: 0.57  AORTA Ao Root diam: 2.80 cm Ao Asc diam:  3.50 cm  MITRAL VALVE MV Area (PHT): 2.60 cm     SHUNTS MV Area VTI:   1.74 cm     Systemic VTI:  0.34  m MV Peak grad:  30.6 mmHg    Systemic Diam: 2.20 cm MV Mean grad:  16.0 mmHg MV Vmax:       2.77 m/s MV Vmean:      191.7 cm/s MV Decel Time: 292 msec MV E velocity: 162.00 cm/s MV A velocity: 193.00 cm/s MV E/A ratio:  0.84  Epifanio Lesches MD Electronically signed by Epifanio Lesches MD Signature Date/Time: 12/06/2022/12:12:53 PM    Final             Risk Assessment/Calculations:    CHA2DS2-VASc Score = 5   This indicates a 7.2% annual risk of stroke. The patient's score is based upon: CHF History: 0 HTN History: 1 Diabetes History: 0 Stroke History:  0 Vascular Disease History: 1 Age Score: 2 Gender Score: 1            Physical Exam:   VS:  BP 128/78   Pulse (!) 145   Ht 5\' 8"  (1.727 m)   Wt 270 lb (122.5 kg)   SpO2 98%   BMI 41.05 kg/m    Wt Readings from Last 3 Encounters:  08/14/23 270 lb (122.5 kg)  08/12/23 253 lb 15.5 oz (115.2 kg)  05/30/23 254 lb (115.2 kg)    GEN: Well nourished, well developed in no acute distress NECK: No JVD; No carotid bruits CARDIAC: Tachycardic, no murmurs, rubs, gallops RESPIRATORY:  Clear to auscultation without rales, wheezing or rhonchi  ABDOMEN: Soft, non-tender, non-distended EXTREMITIES:  No edema; No deformity   ASSESSMENT AND PLAN: .    PAF: Patient previously underwent cardioversion in early 2023.  Eliquis was discontinued in January 2024 due to development of rectus sheath hematoma.  This was in the setting of IV heparin.  She has not had any new issue was bleeding since.  EKG today showed A-fib with RVR with heart rate in the 140s.  She had multiple recurrence of A-fib recently.  After discussing risk and benefit with the patient, we ultimately decided to restart Eliquis.  I will also increase her metoprolol succinate to 50 mg twice daily for better rate control.  I plan to see the patient back in 1 week for reassessment.  If heart rate is uncontrolled, will proceed with TEE cardioversion at that time.  CAD: Denies any recent chest pain.  Will discontinue aspirin given the need for Eliquis  LVH: Recent echocardiogram showed hyperdynamic LV.  History of TAVR: Followed at [facility]  Most recent echocardiogram obtained in May 2024 showed a stable TAVR valve  Moderate mitral regurgitation: Seen on the recent echocardiogram.  Continue to observe  Hypertension: Blood pressure stable  Hyperlipidemia: On Crestor      Dispo: Follow-up in 1 week  Signed, Azalee Course, PA

## 2023-08-20 ENCOUNTER — Other Ambulatory Visit: Payer: Self-pay | Admitting: Cardiology

## 2023-08-20 ENCOUNTER — Ambulatory Visit: Payer: Medicare Other | Attending: Physician Assistant | Admitting: Physician Assistant

## 2023-08-20 ENCOUNTER — Encounter: Payer: Self-pay | Admitting: Physician Assistant

## 2023-08-20 VITALS — BP 112/50 | HR 66 | Ht 68.0 in | Wt 271.0 lb

## 2023-08-20 DIAGNOSIS — I4819 Other persistent atrial fibrillation: Secondary | ICD-10-CM

## 2023-08-20 DIAGNOSIS — I10 Essential (primary) hypertension: Secondary | ICD-10-CM | POA: Diagnosis not present

## 2023-08-20 DIAGNOSIS — I480 Paroxysmal atrial fibrillation: Secondary | ICD-10-CM

## 2023-08-20 DIAGNOSIS — I421 Obstructive hypertrophic cardiomyopathy: Secondary | ICD-10-CM | POA: Diagnosis not present

## 2023-08-20 DIAGNOSIS — I2510 Atherosclerotic heart disease of native coronary artery without angina pectoris: Secondary | ICD-10-CM | POA: Diagnosis not present

## 2023-08-20 DIAGNOSIS — Z952 Presence of prosthetic heart valve: Secondary | ICD-10-CM | POA: Diagnosis not present

## 2023-08-20 DIAGNOSIS — E785 Hyperlipidemia, unspecified: Secondary | ICD-10-CM

## 2023-08-20 MED ORDER — METOPROLOL SUCCINATE ER 50 MG PO TB24: ORAL_TABLET | ORAL | Status: DC

## 2023-08-20 NOTE — Patient Instructions (Signed)
Medication Instructions:  DECREASE METOPROLOL SUCCINATE TO 50 MG IN THE MORNING AND 25 MG IN THE AFTERNOON. *If you need a refill on your cardiac medications before your next appointment, please call your pharmacy*   Lab Work: NO LABS If you have labs (blood work) drawn today and your tests are completely normal, you will receive your results only by: MyChart Message (if you have MyChart) OR A paper copy in the mail If you have any lab test that is abnormal or we need to change your treatment, we will call you to review the results.   Testing/Procedures: NO TESTING   Follow-Up: At [facility], you and your health needs are our priority.  As part of our continuing mission to provide you with exceptional heart care, we have created designated Provider Care Teams.  These Care Teams include your primary Cardiologist (physician) and Advanced Practice Providers (APPs -  Physician Assistants and Nurse Practitioners) who all work together to provide you with the care you need, when you need it.  We recommend signing up for the patient portal called "MyChart".  Sign up information is provided on this After Visit Summary.  MyChart is used to connect with patients for Virtual Visits (Telemedicine).  Patients are able to view lab/test results, encounter notes, upcoming appointments, etc.  Non-urgent messages can be sent to your provider as well.   To learn more about what you can do with MyChart, go to [email or web address].    Your next appointment:   2 week(s)  Provider:   Fannie Knee

## 2023-08-20 NOTE — Progress Notes (Unsigned)
Cardiology Office Note:  .   Date:  08/20/2023  ID:  KINYATA HOSEK, DOB 21-Nov-1941, MRN [id number] PCP: Charlane Ferretti, DO  [facility] Providers Cardiologist:  Rollene Rotunda, MD { Click to update primary MD,subspecialty MD or APP then REFRESH:1}   History of Present Illness: .   Victoria Delgado is a 82 y.o. female with PMH of CAD, asymmetric septal hypertrophy, A-fib, history AS s/p TAVR, mitral stenosis, sleep apnea, hypertension and hyperlipidemia.  Patient was admitted at [facility] in May 2017 with left arm pain.  She had a positive troponin and underwent PCI of left circumflex and the first diagonal.  She was discharged on aspirin and Effient.  Echocardiogram showed preserved EF but moderate aortic stenosis.  She underwent cardiac catheterization again in April 2019 for chest pain, this showed patent stent in the proximal to mid left circumflex artery, 20% ostial left circumflex artery lesion, 40% OM 3 lesion, widely patent stent in ostial D1, 20% mid LAD lesion.  Cardiac catheterization did confirm moderate aortic stenosis with mean gradient 26.7 mmHg.  Medical therapy was recommended.  She eventually underwent TAVR at [facility] as her aortic stenosis progressed to severe range.  Patient was admitted in December with COVID and had atrial fibrillation with RVR.  She was treated with IV amiodarone and sent home on oral amiodarone and anticoagulation therapy.  Echocardiogram obtained during the admission showed normal TAVR gradient with moderate mitral stenosis.    Patient was readmitted in January 2024 due to acute respiratory failure in the setting of RSV and COPD exacerbation.  She had elevated troponin up to 1300.  Initial plan was for left and right heart cath, however patient was complaining of abdominal pain.  Subsequent CT showed a large intramuscular hemorrhage in the right rectus sheath.  Eliquis was discontinued.  She was switched to aspirin.  She has not had any further  A-fib.  Most recent echocardiogram obtained on 04/25/2023 showed stable TAVR valve, hyperdynamic LV function, moderate mitral stenosis with mean gradient 9 mmHg.   Since the recent visit, patient presented to the ED twice  on 08/06/2023 and again on 08/12/2023 with palpitation.  According to ED visit on 9/3, she spontaneously converted back to sinus rhythm.  She remained in A-fib following ED visit on 9/9.  I saw the patient on 08/14/2019 for for post ED follow-up, she had remained in A-fib with RVR with heart rate in the 140s.  She has shortness of breath with walking.  I discussed her symptom was DOD Dr. Rennis Golden, we decided to reinitiate Eliquis at 5 mg twice a day and discontinue her aspirin.  We also increased her metoprolol succinate from 50 mg daily to 50 mg twice daily for better rate control.  She presents today for 1 week follow-up.  Her heart rate is now in the 60s while in A-fib on today's EKG.  She is no longer short of breath when she ambulates.  She still feels the palpitation now.  She has no chest pain or significant dyspnea.  She appears to be euvolemic on exam.  Her Apple Watch however shows that often her heart rate will dip down to the 40s during the day.  I will reduce her metoprolol succinate to 50 mg a.m. and 25 mg p.m.  I plan to see the patient back in 2 weeks for reassessment.  At that time, if she is still maintaining A-fib, I will set up outpatient cardioversion.  If she has recurrence  of A-fib in the future, may need to consider restarting antiarrhythmic therapy such as amiodarone.  She is worried about pulmonary toxicity profile of amiodarone due to baseline pulmonary issues.  ROS: ***  Studies Reviewed: .        *** Risk Assessment/Calculations:   {Does this patient have ATRIAL FIBRILLATION?:[phone]}         Physical Exam:   VS:  BP (!) 112/50   Pulse 66   Ht 5\' 8"  (1.727 m)   Wt 271 lb (122.9 kg)   SpO2 97%   BMI 41.21 kg/m    Wt Readings from Last 3 Encounters:   08/20/23 271 lb (122.9 kg)  08/14/23 270 lb (122.5 kg)  08/12/23 253 lb 15.5 oz (115.2 kg)    GEN: Well nourished, well developed in no acute distress NECK: No JVD; No carotid bruits CARDIAC: ***RRR, no murmurs, rubs, gallops RESPIRATORY:  Clear to auscultation without rales, wheezing or rhonchi  ABDOMEN: Soft, non-tender, non-distended EXTREMITIES:  No edema; No deformity   ASSESSMENT AND PLAN: .   ***    {Are you ordering a CV Procedure (e.g. stress test, cath, DCCV, TEE, etc)?   Press F2        :[id number]}  Dispo: ***  Signed, Azalee Course, PA

## 2023-09-05 ENCOUNTER — Other Ambulatory Visit: Payer: Self-pay | Admitting: Physician Assistant

## 2023-09-05 ENCOUNTER — Ambulatory Visit: Payer: Medicare Other | Attending: Physician Assistant | Admitting: Physician Assistant

## 2023-09-05 ENCOUNTER — Encounter: Payer: Self-pay | Admitting: Physician Assistant

## 2023-09-05 VITALS — BP 102/78 | HR 131 | Ht 68.0 in | Wt 270.0 lb

## 2023-09-05 DIAGNOSIS — I4819 Other persistent atrial fibrillation: Secondary | ICD-10-CM

## 2023-09-05 DIAGNOSIS — I2510 Atherosclerotic heart disease of native coronary artery without angina pectoris: Secondary | ICD-10-CM

## 2023-09-05 DIAGNOSIS — I10 Essential (primary) hypertension: Secondary | ICD-10-CM | POA: Diagnosis not present

## 2023-09-05 DIAGNOSIS — Z952 Presence of prosthetic heart valve: Secondary | ICD-10-CM | POA: Diagnosis not present

## 2023-09-05 NOTE — Progress Notes (Signed)
Follow her aortic stenosis for now as it appears to be moderate.  Findings Coronary Findings Diagnostic  Dominance: Right  Left Anterior Descending Ost LAD to Prox LAD lesion is 20% stenosed. Mid LAD lesion is 20% stenosed.  First Diagonal Branch Vessel is moderate in size. Previously placed Ost 1st Diag to 1st Diag stent (unknown type) is widely patent.  Second Diagonal Branch Vessel is small in size.  Left Circumflex Ost Cx to Prox Cx lesion is 20% stenosed. Previously placed Prox Cx to Mid Cx stent (unknown type) is widely patent.  First Obtuse Marginal Branch Vessel is small in size.  Third Obtuse Marginal Branch Vessel is moderate in size. Ost 3rd Mrg lesion is 40% stenosed.  Right Coronary Artery Vessel is large. Mid RCA lesion is 10% stenosed.  Intervention  No interventions have been documented.    STRESS TESTS  NM MYOCAR MULTI W/SPECT W 03/18/2018  Narrative  There was no ST segment deviation noted during stress.  Nuclear stress EF: 64%. No significant wall motion abnormalities detected  No T wave inversion was noted during stress.  Defect 1: There is a large defect of severe severity present in the basal anterior, basal anteroseptal, basal inferoseptal, mid anterior, mid anteroseptal, mid inferoseptal and apex location.  Findings consistent with ischemia.  This is a high risk study.  Donato Schultz, MD   ECHOCARDIOGRAM  ECHOCARDIOGRAM COMPLETE 12/06/2022  Narrative ECHOCARDIOGRAM REPORT    Patient Name:   CATLYNN GRONDAHL Date of Exam: 12/06/2022 Medical Rec #:  [id number]     Height:       68.0 in Accession #:    [id number]    Weight:       278.0 lb Date of Birth:  December 04, 1940    BSA:          2.351 m Patient Age:    82 years      BP:           109/62 mmHg Patient Gender: F             HR:           84 bpm. Exam Location:  Inpatient  Procedure: 2D Echo, Cardiac Doppler and Color Doppler  Indications:    Other abnormalities of the heart R00.8  History:        Patient has prior history of Echocardiogram examinations, most recent 11/04/2022. CAD and Angina, Arrythmias:Atrial Fibrillation and Tachycardia, Signs/Symptoms:Chest Pain and Shortness of Breath; Risk Factors:Hypertension, Sleep Apnea and Dyslipidemia. Aortic Valve: 23 mm Edwards Sapien prosthetic, stented (TAVR) valve is present in the aortic position. Procedure Date: 05/14/22.  Sonographer:    Lucendia Herrlich Sonographer#2:  Irving Burton Senior Referring Phys: [id number] SARA-MAIZ A [place]  IMPRESSIONS   1. Left ventricular ejection fraction, by estimation, is 70 to 75%. The left ventricle has hyperdynamic function. The left ventricle has no regional wall motion abnormalities. There is moderate left ventricular hypertrophy. Left ventricular diastolic parameters are indeterminate. 2. Resting peak LVOT gradient 84  mmHg 3. Right ventricular systolic function is normal. The right ventricular size is normal. Tricuspid regurgitation signal is inadequate for assessing PA pressure. 4. Left atrial size was mildly dilated. 5. A small pericardial effusion is present. 6. The mitral valve is degenerative. Trivial mitral valve regurgitation. Severe mitral stenosis. The mean mitral valve gradient is 16.0 mmHg with average heart rate of 82 bpm. Severe mitral annular calcification. 7. The aortic valve has been repaired/replaced. Aortic valve regurgitation is not visualized. There is a 23  Cardiology Office Note:  .   Date:  09/05/2023  ID:  MAFALDA MCGINNISS, DOB 03/11/1941, MRN [id number] PCP: Charlane Ferretti, DO  [facility] Providers Cardiologist:  Rollene Rotunda, MD     History of Present Illness: .   JANERA PEUGH is a 82 y.o. female with PMH of CAD, asymmetric septal hypertrophy, A-fib, history AS s/p TAVR, mitral stenosis, sleep apnea, hypertension and hyperlipidemia.  Patient was admitted at [facility] in May 2017 with left arm pain.  She had a positive troponin and underwent PCI of left circumflex and the first diagonal.  She was discharged on aspirin and Effient.  Echocardiogram showed preserved EF but moderate aortic stenosis.  She underwent cardiac catheterization again in April 2019 for chest pain, this showed patent stent in the proximal to mid left circumflex artery, 20% ostial left circumflex artery lesion, 40% OM 3 lesion, widely patent stent in ostial D1, 20% mid LAD lesion.  Cardiac catheterization did confirm moderate aortic stenosis with mean gradient 26.7 mmHg.  Medical therapy was recommended.  She eventually underwent TAVR at [facility] as her aortic stenosis progressed to severe range.  Patient was admitted in December with COVID and had atrial fibrillation with RVR.  She was treated with IV amiodarone and sent home on oral amiodarone and anticoagulation therapy.  Echocardiogram obtained during the admission showed normal TAVR gradient with moderate mitral stenosis.    Patient was readmitted in January 2024 due to acute respiratory failure in the setting of RSV and COPD exacerbation. She had elevated troponin up to 1300. Initial plan was for left and right heart cath, however patient was complaining of abdominal pain. Subsequent CT showed a large intramuscular hemorrhage in the right rectus sheath. Eliquis was discontinued. She was switched to aspirin. She has not had any further A-fib. Most recent echocardiogram obtained on 04/25/2023 showed stable TAVR  valve, hyperdynamic LV function, moderate mitral stenosis with mean gradient 9 mmHg.   She presented to the ED in early September twice with palpitation.  During the first her ED visit, she spontaneously converted back to sinus rhythm, however she remained in A-fib following the recurrent A-fib on 9/9.  I saw the patient for posthospital follow-up, she was in A-fib with RVR with heart rate in the 140s.  After discussing with DOD, we reinitiated his Eliquis and discontinued her aspirin.  We also increased her metoprolol succinate from 50 mg daily to 50 mg twice a day for better rate control.  When I saw her back on 9/17, her heart rate was better controlled, however her Apple Watch mention that her heart rate occasionally dipped down to the 40s during the day.  I reduced her metoprolol succinate to 50 mg a.m. and 25 mg p.m.  She returned today for follow-up.  She remained in atrial fibrillation with RVR at this time.  Heart rate is 130s today, however based on her Apple Watch, her heart rate has been mostly in the 80-100 range at home in the past few days.  She has been compliant with Eliquis.  We will proceed with outpatient DC cardioversion.  The case was discussed with DOD Dr. Rennis Golden who was agreeable.  She appears to be euvolemic on exam.  She will hold Jardiance for 3 days and Lasix in the morning of the study.    ROS:   She denies chest pain, palpitations, dyspnea, pnd, orthopnea, n, v, dizziness, syncope, edema, weight gain, or early satiety. All other systems reviewed and are  Follow her aortic stenosis for now as it appears to be moderate.  Findings Coronary Findings Diagnostic  Dominance: Right  Left Anterior Descending Ost LAD to Prox LAD lesion is 20% stenosed. Mid LAD lesion is 20% stenosed.  First Diagonal Branch Vessel is moderate in size. Previously placed Ost 1st Diag to 1st Diag stent (unknown type) is widely patent.  Second Diagonal Branch Vessel is small in size.  Left Circumflex Ost Cx to Prox Cx lesion is 20% stenosed. Previously placed Prox Cx to Mid Cx stent (unknown type) is widely patent.  First Obtuse Marginal Branch Vessel is small in size.  Third Obtuse Marginal Branch Vessel is moderate in size. Ost 3rd Mrg lesion is 40% stenosed.  Right Coronary Artery Vessel is large. Mid RCA lesion is 10% stenosed.  Intervention  No interventions have been documented.    STRESS TESTS  NM MYOCAR MULTI W/SPECT W 03/18/2018  Narrative  There was no ST segment deviation noted during stress.  Nuclear stress EF: 64%. No significant wall motion abnormalities detected  No T wave inversion was noted during stress.  Defect 1: There is a large defect of severe severity present in the basal anterior, basal anteroseptal, basal inferoseptal, mid anterior, mid anteroseptal, mid inferoseptal and apex location.  Findings consistent with ischemia.  This is a high risk study.  Donato Schultz, MD   ECHOCARDIOGRAM  ECHOCARDIOGRAM COMPLETE 12/06/2022  Narrative ECHOCARDIOGRAM REPORT    Patient Name:   CATLYNN GRONDAHL Date of Exam: 12/06/2022 Medical Rec #:  [id number]     Height:       68.0 in Accession #:    [id number]    Weight:       278.0 lb Date of Birth:  December 04, 1940    BSA:          2.351 m Patient Age:    82 years      BP:           109/62 mmHg Patient Gender: F             HR:           84 bpm. Exam Location:  Inpatient  Procedure: 2D Echo, Cardiac Doppler and Color Doppler  Indications:    Other abnormalities of the heart R00.8  History:        Patient has prior history of Echocardiogram examinations, most recent 11/04/2022. CAD and Angina, Arrythmias:Atrial Fibrillation and Tachycardia, Signs/Symptoms:Chest Pain and Shortness of Breath; Risk Factors:Hypertension, Sleep Apnea and Dyslipidemia. Aortic Valve: 23 mm Edwards Sapien prosthetic, stented (TAVR) valve is present in the aortic position. Procedure Date: 05/14/22.  Sonographer:    Lucendia Herrlich Sonographer#2:  Irving Burton Senior Referring Phys: [id number] SARA-MAIZ A [place]  IMPRESSIONS   1. Left ventricular ejection fraction, by estimation, is 70 to 75%. The left ventricle has hyperdynamic function. The left ventricle has no regional wall motion abnormalities. There is moderate left ventricular hypertrophy. Left ventricular diastolic parameters are indeterminate. 2. Resting peak LVOT gradient 84  mmHg 3. Right ventricular systolic function is normal. The right ventricular size is normal. Tricuspid regurgitation signal is inadequate for assessing PA pressure. 4. Left atrial size was mildly dilated. 5. A small pericardial effusion is present. 6. The mitral valve is degenerative. Trivial mitral valve regurgitation. Severe mitral stenosis. The mean mitral valve gradient is 16.0 mmHg with average heart rate of 82 bpm. Severe mitral annular calcification. 7. The aortic valve has been repaired/replaced. Aortic valve regurgitation is not visualized. There is a 23  Follow her aortic stenosis for now as it appears to be moderate.  Findings Coronary Findings Diagnostic  Dominance: Right  Left Anterior Descending Ost LAD to Prox LAD lesion is 20% stenosed. Mid LAD lesion is 20% stenosed.  First Diagonal Branch Vessel is moderate in size. Previously placed Ost 1st Diag to 1st Diag stent (unknown type) is widely patent.  Second Diagonal Branch Vessel is small in size.  Left Circumflex Ost Cx to Prox Cx lesion is 20% stenosed. Previously placed Prox Cx to Mid Cx stent (unknown type) is widely patent.  First Obtuse Marginal Branch Vessel is small in size.  Third Obtuse Marginal Branch Vessel is moderate in size. Ost 3rd Mrg lesion is 40% stenosed.  Right Coronary Artery Vessel is large. Mid RCA lesion is 10% stenosed.  Intervention  No interventions have been documented.    STRESS TESTS  NM MYOCAR MULTI W/SPECT W 03/18/2018  Narrative  There was no ST segment deviation noted during stress.  Nuclear stress EF: 64%. No significant wall motion abnormalities detected  No T wave inversion was noted during stress.  Defect 1: There is a large defect of severe severity present in the basal anterior, basal anteroseptal, basal inferoseptal, mid anterior, mid anteroseptal, mid inferoseptal and apex location.  Findings consistent with ischemia.  This is a high risk study.  Donato Schultz, MD   ECHOCARDIOGRAM  ECHOCARDIOGRAM COMPLETE 12/06/2022  Narrative ECHOCARDIOGRAM REPORT    Patient Name:   CATLYNN GRONDAHL Date of Exam: 12/06/2022 Medical Rec #:  [id number]     Height:       68.0 in Accession #:    [id number]    Weight:       278.0 lb Date of Birth:  December 04, 1940    BSA:          2.351 m Patient Age:    82 years      BP:           109/62 mmHg Patient Gender: F             HR:           84 bpm. Exam Location:  Inpatient  Procedure: 2D Echo, Cardiac Doppler and Color Doppler  Indications:    Other abnormalities of the heart R00.8  History:        Patient has prior history of Echocardiogram examinations, most recent 11/04/2022. CAD and Angina, Arrythmias:Atrial Fibrillation and Tachycardia, Signs/Symptoms:Chest Pain and Shortness of Breath; Risk Factors:Hypertension, Sleep Apnea and Dyslipidemia. Aortic Valve: 23 mm Edwards Sapien prosthetic, stented (TAVR) valve is present in the aortic position. Procedure Date: 05/14/22.  Sonographer:    Lucendia Herrlich Sonographer#2:  Irving Burton Senior Referring Phys: [id number] SARA-MAIZ A [place]  IMPRESSIONS   1. Left ventricular ejection fraction, by estimation, is 70 to 75%. The left ventricle has hyperdynamic function. The left ventricle has no regional wall motion abnormalities. There is moderate left ventricular hypertrophy. Left ventricular diastolic parameters are indeterminate. 2. Resting peak LVOT gradient 84  mmHg 3. Right ventricular systolic function is normal. The right ventricular size is normal. Tricuspid regurgitation signal is inadequate for assessing PA pressure. 4. Left atrial size was mildly dilated. 5. A small pericardial effusion is present. 6. The mitral valve is degenerative. Trivial mitral valve regurgitation. Severe mitral stenosis. The mean mitral valve gradient is 16.0 mmHg with average heart rate of 82 bpm. Severe mitral annular calcification. 7. The aortic valve has been repaired/replaced. Aortic valve regurgitation is not visualized. There is a 23  Cardiology Office Note:  .   Date:  09/05/2023  ID:  MAFALDA MCGINNISS, DOB 03/11/1941, MRN [id number] PCP: Charlane Ferretti, DO  [facility] Providers Cardiologist:  Rollene Rotunda, MD     History of Present Illness: .   JANERA PEUGH is a 82 y.o. female with PMH of CAD, asymmetric septal hypertrophy, A-fib, history AS s/p TAVR, mitral stenosis, sleep apnea, hypertension and hyperlipidemia.  Patient was admitted at [facility] in May 2017 with left arm pain.  She had a positive troponin and underwent PCI of left circumflex and the first diagonal.  She was discharged on aspirin and Effient.  Echocardiogram showed preserved EF but moderate aortic stenosis.  She underwent cardiac catheterization again in April 2019 for chest pain, this showed patent stent in the proximal to mid left circumflex artery, 20% ostial left circumflex artery lesion, 40% OM 3 lesion, widely patent stent in ostial D1, 20% mid LAD lesion.  Cardiac catheterization did confirm moderate aortic stenosis with mean gradient 26.7 mmHg.  Medical therapy was recommended.  She eventually underwent TAVR at [facility] as her aortic stenosis progressed to severe range.  Patient was admitted in December with COVID and had atrial fibrillation with RVR.  She was treated with IV amiodarone and sent home on oral amiodarone and anticoagulation therapy.  Echocardiogram obtained during the admission showed normal TAVR gradient with moderate mitral stenosis.    Patient was readmitted in January 2024 due to acute respiratory failure in the setting of RSV and COPD exacerbation. She had elevated troponin up to 1300. Initial plan was for left and right heart cath, however patient was complaining of abdominal pain. Subsequent CT showed a large intramuscular hemorrhage in the right rectus sheath. Eliquis was discontinued. She was switched to aspirin. She has not had any further A-fib. Most recent echocardiogram obtained on 04/25/2023 showed stable TAVR  valve, hyperdynamic LV function, moderate mitral stenosis with mean gradient 9 mmHg.   She presented to the ED in early September twice with palpitation.  During the first her ED visit, she spontaneously converted back to sinus rhythm, however she remained in A-fib following the recurrent A-fib on 9/9.  I saw the patient for posthospital follow-up, she was in A-fib with RVR with heart rate in the 140s.  After discussing with DOD, we reinitiated his Eliquis and discontinued her aspirin.  We also increased her metoprolol succinate from 50 mg daily to 50 mg twice a day for better rate control.  When I saw her back on 9/17, her heart rate was better controlled, however her Apple Watch mention that her heart rate occasionally dipped down to the 40s during the day.  I reduced her metoprolol succinate to 50 mg a.m. and 25 mg p.m.  She returned today for follow-up.  She remained in atrial fibrillation with RVR at this time.  Heart rate is 130s today, however based on her Apple Watch, her heart rate has been mostly in the 80-100 range at home in the past few days.  She has been compliant with Eliquis.  We will proceed with outpatient DC cardioversion.  The case was discussed with DOD Dr. Rennis Golden who was agreeable.  She appears to be euvolemic on exam.  She will hold Jardiance for 3 days and Lasix in the morning of the study.    ROS:   She denies chest pain, palpitations, dyspnea, pnd, orthopnea, n, v, dizziness, syncope, edema, weight gain, or early satiety. All other systems reviewed and are

## 2023-09-05 NOTE — H&P (View-Only) (Signed)
Follow her aortic stenosis for now as it appears to be moderate.  Findings Coronary Findings Diagnostic  Dominance: Right  Left Anterior Descending Ost LAD to Prox LAD lesion is 20% stenosed. Mid LAD lesion is 20% stenosed.  First Diagonal Branch Vessel is moderate in size. Previously placed Ost 1st Diag to 1st Diag stent (unknown type) is widely patent.  Second Diagonal Branch Vessel is small in size.  Left Circumflex Ost Cx to Prox Cx lesion is 20% stenosed. Previously placed Prox Cx to Mid Cx stent (unknown type) is widely patent.  First Obtuse Marginal Branch Vessel is small in size.  Third Obtuse Marginal Branch Vessel is moderate in size. Ost 3rd Mrg lesion is 40% stenosed.  Right Coronary Artery Vessel is large. Mid RCA lesion is 10% stenosed.  Intervention  No interventions have been documented.    STRESS TESTS  NM MYOCAR MULTI W/SPECT W 03/18/2018  Narrative  There was no ST segment deviation noted during stress.  Nuclear stress EF: 64%. No significant wall motion abnormalities detected  No T wave inversion was noted during stress.  Defect 1: There is a large defect of severe severity present in the basal anterior, basal anteroseptal, basal inferoseptal, mid anterior, mid anteroseptal, mid inferoseptal and apex location.  Findings consistent with ischemia.  This is a high risk study.  Donato Schultz, MD   ECHOCARDIOGRAM  ECHOCARDIOGRAM COMPLETE 12/06/2022  Narrative ECHOCARDIOGRAM REPORT    Patient Name:   CATLYNN GRONDAHL Date of Exam: 12/06/2022 Medical Rec #:  [id number]     Height:       68.0 in Accession #:    [id number]    Weight:       278.0 lb Date of Birth:  December 04, 1940    BSA:          2.351 m Patient Age:    82 years      BP:           109/62 mmHg Patient Gender: F             HR:           84 bpm. Exam Location:  Inpatient  Procedure: 2D Echo, Cardiac Doppler and Color Doppler  Indications:    Other abnormalities of the heart R00.8  History:        Patient has prior history of Echocardiogram examinations, most recent 11/04/2022. CAD and Angina, Arrythmias:Atrial Fibrillation and Tachycardia, Signs/Symptoms:Chest Pain and Shortness of Breath; Risk Factors:Hypertension, Sleep Apnea and Dyslipidemia. Aortic Valve: 23 mm Edwards Sapien prosthetic, stented (TAVR) valve is present in the aortic position. Procedure Date: 05/14/22.  Sonographer:    Lucendia Herrlich Sonographer#2:  Irving Burton Senior Referring Phys: [id number] SARA-MAIZ A [place]  IMPRESSIONS   1. Left ventricular ejection fraction, by estimation, is 70 to 75%. The left ventricle has hyperdynamic function. The left ventricle has no regional wall motion abnormalities. There is moderate left ventricular hypertrophy. Left ventricular diastolic parameters are indeterminate. 2. Resting peak LVOT gradient 84  mmHg 3. Right ventricular systolic function is normal. The right ventricular size is normal. Tricuspid regurgitation signal is inadequate for assessing PA pressure. 4. Left atrial size was mildly dilated. 5. A small pericardial effusion is present. 6. The mitral valve is degenerative. Trivial mitral valve regurgitation. Severe mitral stenosis. The mean mitral valve gradient is 16.0 mmHg with average heart rate of 82 bpm. Severe mitral annular calcification. 7. The aortic valve has been repaired/replaced. Aortic valve regurgitation is not visualized. There is a 23  Cardiology Office Note:  .   Date:  09/05/2023  ID:  MAFALDA MCGINNISS, DOB 03/11/1941, MRN [id number] PCP: Charlane Ferretti, DO  [facility] Providers Cardiologist:  Rollene Rotunda, MD     History of Present Illness: .   JANERA PEUGH is a 82 y.o. female with PMH of CAD, asymmetric septal hypertrophy, A-fib, history AS s/p TAVR, mitral stenosis, sleep apnea, hypertension and hyperlipidemia.  Patient was admitted at [facility] in May 2017 with left arm pain.  She had a positive troponin and underwent PCI of left circumflex and the first diagonal.  She was discharged on aspirin and Effient.  Echocardiogram showed preserved EF but moderate aortic stenosis.  She underwent cardiac catheterization again in April 2019 for chest pain, this showed patent stent in the proximal to mid left circumflex artery, 20% ostial left circumflex artery lesion, 40% OM 3 lesion, widely patent stent in ostial D1, 20% mid LAD lesion.  Cardiac catheterization did confirm moderate aortic stenosis with mean gradient 26.7 mmHg.  Medical therapy was recommended.  She eventually underwent TAVR at [facility] as her aortic stenosis progressed to severe range.  Patient was admitted in December with COVID and had atrial fibrillation with RVR.  She was treated with IV amiodarone and sent home on oral amiodarone and anticoagulation therapy.  Echocardiogram obtained during the admission showed normal TAVR gradient with moderate mitral stenosis.    Patient was readmitted in January 2024 due to acute respiratory failure in the setting of RSV and COPD exacerbation. She had elevated troponin up to 1300. Initial plan was for left and right heart cath, however patient was complaining of abdominal pain. Subsequent CT showed a large intramuscular hemorrhage in the right rectus sheath. Eliquis was discontinued. She was switched to aspirin. She has not had any further A-fib. Most recent echocardiogram obtained on 04/25/2023 showed stable TAVR  valve, hyperdynamic LV function, moderate mitral stenosis with mean gradient 9 mmHg.   She presented to the ED in early September twice with palpitation.  During the first her ED visit, she spontaneously converted back to sinus rhythm, however she remained in A-fib following the recurrent A-fib on 9/9.  I saw the patient for posthospital follow-up, she was in A-fib with RVR with heart rate in the 140s.  After discussing with DOD, we reinitiated his Eliquis and discontinued her aspirin.  We also increased her metoprolol succinate from 50 mg daily to 50 mg twice a day for better rate control.  When I saw her back on 9/17, her heart rate was better controlled, however her Apple Watch mention that her heart rate occasionally dipped down to the 40s during the day.  I reduced her metoprolol succinate to 50 mg a.m. and 25 mg p.m.  She returned today for follow-up.  She remained in atrial fibrillation with RVR at this time.  Heart rate is 130s today, however based on her Apple Watch, her heart rate has been mostly in the 80-100 range at home in the past few days.  She has been compliant with Eliquis.  We will proceed with outpatient DC cardioversion.  The case was discussed with DOD Dr. Rennis Golden who was agreeable.  She appears to be euvolemic on exam.  She will hold Jardiance for 3 days and Lasix in the morning of the study.    ROS:   She denies chest pain, palpitations, dyspnea, pnd, orthopnea, n, v, dizziness, syncope, edema, weight gain, or early satiety. All other systems reviewed and are  Cardiology Office Note:  .   Date:  09/05/2023  ID:  MAFALDA MCGINNISS, DOB 03/11/1941, MRN [id number] PCP: Charlane Ferretti, DO  [facility] Providers Cardiologist:  Rollene Rotunda, MD     History of Present Illness: .   JANERA PEUGH is a 82 y.o. female with PMH of CAD, asymmetric septal hypertrophy, A-fib, history AS s/p TAVR, mitral stenosis, sleep apnea, hypertension and hyperlipidemia.  Patient was admitted at [facility] in May 2017 with left arm pain.  She had a positive troponin and underwent PCI of left circumflex and the first diagonal.  She was discharged on aspirin and Effient.  Echocardiogram showed preserved EF but moderate aortic stenosis.  She underwent cardiac catheterization again in April 2019 for chest pain, this showed patent stent in the proximal to mid left circumflex artery, 20% ostial left circumflex artery lesion, 40% OM 3 lesion, widely patent stent in ostial D1, 20% mid LAD lesion.  Cardiac catheterization did confirm moderate aortic stenosis with mean gradient 26.7 mmHg.  Medical therapy was recommended.  She eventually underwent TAVR at [facility] as her aortic stenosis progressed to severe range.  Patient was admitted in December with COVID and had atrial fibrillation with RVR.  She was treated with IV amiodarone and sent home on oral amiodarone and anticoagulation therapy.  Echocardiogram obtained during the admission showed normal TAVR gradient with moderate mitral stenosis.    Patient was readmitted in January 2024 due to acute respiratory failure in the setting of RSV and COPD exacerbation. She had elevated troponin up to 1300. Initial plan was for left and right heart cath, however patient was complaining of abdominal pain. Subsequent CT showed a large intramuscular hemorrhage in the right rectus sheath. Eliquis was discontinued. She was switched to aspirin. She has not had any further A-fib. Most recent echocardiogram obtained on 04/25/2023 showed stable TAVR  valve, hyperdynamic LV function, moderate mitral stenosis with mean gradient 9 mmHg.   She presented to the ED in early September twice with palpitation.  During the first her ED visit, she spontaneously converted back to sinus rhythm, however she remained in A-fib following the recurrent A-fib on 9/9.  I saw the patient for posthospital follow-up, she was in A-fib with RVR with heart rate in the 140s.  After discussing with DOD, we reinitiated his Eliquis and discontinued her aspirin.  We also increased her metoprolol succinate from 50 mg daily to 50 mg twice a day for better rate control.  When I saw her back on 9/17, her heart rate was better controlled, however her Apple Watch mention that her heart rate occasionally dipped down to the 40s during the day.  I reduced her metoprolol succinate to 50 mg a.m. and 25 mg p.m.  She returned today for follow-up.  She remained in atrial fibrillation with RVR at this time.  Heart rate is 130s today, however based on her Apple Watch, her heart rate has been mostly in the 80-100 range at home in the past few days.  She has been compliant with Eliquis.  We will proceed with outpatient DC cardioversion.  The case was discussed with DOD Dr. Rennis Golden who was agreeable.  She appears to be euvolemic on exam.  She will hold Jardiance for 3 days and Lasix in the morning of the study.    ROS:   She denies chest pain, palpitations, dyspnea, pnd, orthopnea, n, v, dizziness, syncope, edema, weight gain, or early satiety. All other systems reviewed and are  Follow her aortic stenosis for now as it appears to be moderate.  Findings Coronary Findings Diagnostic  Dominance: Right  Left Anterior Descending Ost LAD to Prox LAD lesion is 20% stenosed. Mid LAD lesion is 20% stenosed.  First Diagonal Branch Vessel is moderate in size. Previously placed Ost 1st Diag to 1st Diag stent (unknown type) is widely patent.  Second Diagonal Branch Vessel is small in size.  Left Circumflex Ost Cx to Prox Cx lesion is 20% stenosed. Previously placed Prox Cx to Mid Cx stent (unknown type) is widely patent.  First Obtuse Marginal Branch Vessel is small in size.  Third Obtuse Marginal Branch Vessel is moderate in size. Ost 3rd Mrg lesion is 40% stenosed.  Right Coronary Artery Vessel is large. Mid RCA lesion is 10% stenosed.  Intervention  No interventions have been documented.    STRESS TESTS  NM MYOCAR MULTI W/SPECT W 03/18/2018  Narrative  There was no ST segment deviation noted during stress.  Nuclear stress EF: 64%. No significant wall motion abnormalities detected  No T wave inversion was noted during stress.  Defect 1: There is a large defect of severe severity present in the basal anterior, basal anteroseptal, basal inferoseptal, mid anterior, mid anteroseptal, mid inferoseptal and apex location.  Findings consistent with ischemia.  This is a high risk study.  Donato Schultz, MD   ECHOCARDIOGRAM  ECHOCARDIOGRAM COMPLETE 12/06/2022  Narrative ECHOCARDIOGRAM REPORT    Patient Name:   CATLYNN GRONDAHL Date of Exam: 12/06/2022 Medical Rec #:  [id number]     Height:       68.0 in Accession #:    [id number]    Weight:       278.0 lb Date of Birth:  December 04, 1940    BSA:          2.351 m Patient Age:    82 years      BP:           109/62 mmHg Patient Gender: F             HR:           84 bpm. Exam Location:  Inpatient  Procedure: 2D Echo, Cardiac Doppler and Color Doppler  Indications:    Other abnormalities of the heart R00.8  History:        Patient has prior history of Echocardiogram examinations, most recent 11/04/2022. CAD and Angina, Arrythmias:Atrial Fibrillation and Tachycardia, Signs/Symptoms:Chest Pain and Shortness of Breath; Risk Factors:Hypertension, Sleep Apnea and Dyslipidemia. Aortic Valve: 23 mm Edwards Sapien prosthetic, stented (TAVR) valve is present in the aortic position. Procedure Date: 05/14/22.  Sonographer:    Lucendia Herrlich Sonographer#2:  Irving Burton Senior Referring Phys: [id number] SARA-MAIZ A [place]  IMPRESSIONS   1. Left ventricular ejection fraction, by estimation, is 70 to 75%. The left ventricle has hyperdynamic function. The left ventricle has no regional wall motion abnormalities. There is moderate left ventricular hypertrophy. Left ventricular diastolic parameters are indeterminate. 2. Resting peak LVOT gradient 84  mmHg 3. Right ventricular systolic function is normal. The right ventricular size is normal. Tricuspid regurgitation signal is inadequate for assessing PA pressure. 4. Left atrial size was mildly dilated. 5. A small pericardial effusion is present. 6. The mitral valve is degenerative. Trivial mitral valve regurgitation. Severe mitral stenosis. The mean mitral valve gradient is 16.0 mmHg with average heart rate of 82 bpm. Severe mitral annular calcification. 7. The aortic valve has been repaired/replaced. Aortic valve regurgitation is not visualized. There is a 23  Cardiology Office Note:  .   Date:  09/05/2023  ID:  MAFALDA MCGINNISS, DOB 03/11/1941, MRN [id number] PCP: Charlane Ferretti, DO  [facility] Providers Cardiologist:  Rollene Rotunda, MD     History of Present Illness: .   JANERA PEUGH is a 82 y.o. female with PMH of CAD, asymmetric septal hypertrophy, A-fib, history AS s/p TAVR, mitral stenosis, sleep apnea, hypertension and hyperlipidemia.  Patient was admitted at [facility] in May 2017 with left arm pain.  She had a positive troponin and underwent PCI of left circumflex and the first diagonal.  She was discharged on aspirin and Effient.  Echocardiogram showed preserved EF but moderate aortic stenosis.  She underwent cardiac catheterization again in April 2019 for chest pain, this showed patent stent in the proximal to mid left circumflex artery, 20% ostial left circumflex artery lesion, 40% OM 3 lesion, widely patent stent in ostial D1, 20% mid LAD lesion.  Cardiac catheterization did confirm moderate aortic stenosis with mean gradient 26.7 mmHg.  Medical therapy was recommended.  She eventually underwent TAVR at [facility] as her aortic stenosis progressed to severe range.  Patient was admitted in December with COVID and had atrial fibrillation with RVR.  She was treated with IV amiodarone and sent home on oral amiodarone and anticoagulation therapy.  Echocardiogram obtained during the admission showed normal TAVR gradient with moderate mitral stenosis.    Patient was readmitted in January 2024 due to acute respiratory failure in the setting of RSV and COPD exacerbation. She had elevated troponin up to 1300. Initial plan was for left and right heart cath, however patient was complaining of abdominal pain. Subsequent CT showed a large intramuscular hemorrhage in the right rectus sheath. Eliquis was discontinued. She was switched to aspirin. She has not had any further A-fib. Most recent echocardiogram obtained on 04/25/2023 showed stable TAVR  valve, hyperdynamic LV function, moderate mitral stenosis with mean gradient 9 mmHg.   She presented to the ED in early September twice with palpitation.  During the first her ED visit, she spontaneously converted back to sinus rhythm, however she remained in A-fib following the recurrent A-fib on 9/9.  I saw the patient for posthospital follow-up, she was in A-fib with RVR with heart rate in the 140s.  After discussing with DOD, we reinitiated his Eliquis and discontinued her aspirin.  We also increased her metoprolol succinate from 50 mg daily to 50 mg twice a day for better rate control.  When I saw her back on 9/17, her heart rate was better controlled, however her Apple Watch mention that her heart rate occasionally dipped down to the 40s during the day.  I reduced her metoprolol succinate to 50 mg a.m. and 25 mg p.m.  She returned today for follow-up.  She remained in atrial fibrillation with RVR at this time.  Heart rate is 130s today, however based on her Apple Watch, her heart rate has been mostly in the 80-100 range at home in the past few days.  She has been compliant with Eliquis.  We will proceed with outpatient DC cardioversion.  The case was discussed with DOD Dr. Rennis Golden who was agreeable.  She appears to be euvolemic on exam.  She will hold Jardiance for 3 days and Lasix in the morning of the study.    ROS:   She denies chest pain, palpitations, dyspnea, pnd, orthopnea, n, v, dizziness, syncope, edema, weight gain, or early satiety. All other systems reviewed and are

## 2023-09-05 NOTE — Patient Instructions (Signed)
Medication Instructions:  NO CHANGES *If you need a refill on your cardiac medications before your next appointment, please call your pharmacy*   Lab Work: NO LABS If you have labs (blood work) drawn today and your tests are completely normal, you will receive your results only by: MyChart Message (if you have MyChart) OR A paper copy in the mail If you have any lab test that is abnormal or we need to change your treatment, we will call you to review the results.   Testing/Procedures:  You are scheduled for a Cardioversion on TUESDAY OCTOBER 8,2024 with Dr. Jodelle Red, MD.  Please arrive at the [facility] (Main Entrance A) at [facility]: [address] [place], [place] [address] at 9:45 am. (1 hour prior to procedure unless lab work is needed; if lab work is needed arrive 1.5 hours ahead)  DIET: Nothing to eat or drink after midnight except a sip of water with medications (see medication instructions below)  FYI: For your safety, and to allow [place] to monitor your vital signs accurately during the surgery/procedure we request that   if you have artificial nails, gel coating, SNS etc. Please have those removed prior to your surgery/procedure. Not having the nail coverings /polish removed may result in cancellation or delay of your surgery/procedure.   Medication Instructions: Hold JARDIANCE FOR 3 DAYS PRIOR TO STUDY. Hold LASIX THE MORNING OF STUDY.   You must have a responsible person to drive you home and stay in the waiting area during your procedure. Failure to do so could result in cancellation.  Bring your insurance cards.  *Special Note: Every effort is made to have your procedure done on time. Occasionally there are emergencies that occur at the hospital that may cause delays. Please be patient if a delay does occur.     Follow-Up: At [facility], you and your health needs are our priority.  As part of our continuing mission to provide you with  exceptional heart care, we have created designated Provider Care Teams.  These Care Teams include your primary Cardiologist (physician) and Advanced Practice Providers (APPs -  Physician Assistants and Nurse Practitioners) who all work together to provide you with the care you need, when you need it.   Your next appointment:   3 week(s) after study  Provider:   Fannie Knee

## 2023-09-09 NOTE — Progress Notes (Signed)
Spoke to patient and instructed them to come at 09:15  and to be NPO after 0000.    Confirmed that patient will have a ride home and someone to stay with them for 24 hours after the procedure.

## 2023-09-10 ENCOUNTER — Other Ambulatory Visit: Payer: Self-pay

## 2023-09-10 ENCOUNTER — Ambulatory Visit (HOSPITAL_COMMUNITY): Payer: Medicare Other | Admitting: Anesthesiology

## 2023-09-10 ENCOUNTER — Ambulatory Visit (HOSPITAL_COMMUNITY)
Admission: RE | Admit: 2023-09-10 | Discharge: 2023-09-10 | Disposition: A | Discharge status: Home / Self Care | Payer: Medicare Other | Attending: Cardiology | Admitting: Cardiology

## 2023-09-10 ENCOUNTER — Encounter (HOSPITAL_COMMUNITY): Payer: Self-pay | Admitting: Cardiology

## 2023-09-10 ENCOUNTER — Encounter (HOSPITAL_COMMUNITY)
Admission: RE | Disposition: A | Discharge status: Home / Self Care | Payer: Self-pay | Source: Home / Self Care | Attending: Cardiology

## 2023-09-10 ENCOUNTER — Telehealth: Payer: Self-pay

## 2023-09-10 DIAGNOSIS — I25119 Atherosclerotic heart disease of native coronary artery with unspecified angina pectoris: Secondary | ICD-10-CM | POA: Diagnosis not present

## 2023-09-10 DIAGNOSIS — Z7901 Long term (current) use of anticoagulants: Secondary | ICD-10-CM | POA: Insufficient documentation

## 2023-09-10 DIAGNOSIS — Z952 Presence of prosthetic heart valve: Secondary | ICD-10-CM | POA: Diagnosis not present

## 2023-09-10 DIAGNOSIS — I4891 Unspecified atrial fibrillation: Secondary | ICD-10-CM

## 2023-09-10 DIAGNOSIS — I119 Hypertensive heart disease without heart failure: Secondary | ICD-10-CM | POA: Insufficient documentation

## 2023-09-10 DIAGNOSIS — Z6841 Body Mass Index (BMI) 40.0 and over, adult: Secondary | ICD-10-CM | POA: Insufficient documentation

## 2023-09-10 DIAGNOSIS — I4892 Unspecified atrial flutter: Secondary | ICD-10-CM | POA: Insufficient documentation

## 2023-09-10 DIAGNOSIS — G4730 Sleep apnea, unspecified: Secondary | ICD-10-CM | POA: Insufficient documentation

## 2023-09-10 DIAGNOSIS — Z955 Presence of coronary angioplasty implant and graft: Secondary | ICD-10-CM | POA: Diagnosis not present

## 2023-09-10 DIAGNOSIS — I4819 Other persistent atrial fibrillation: Secondary | ICD-10-CM | POA: Insufficient documentation

## 2023-09-10 HISTORY — PX: CARDIOVERSION: SHX1299

## 2023-09-10 LAB — POCT I-STAT, CHEM 8
BUN: 14 mg/dL (ref 8–23)
Calcium, Ion: 1.18 mmol/L (ref 1.15–1.40)
Chloride: 104 mmol/L (ref 98–111)
Creatinine, Ser: 0.9 mg/dL (ref 0.44–1.00)
Glucose, Bld: 152 mg/dL — ABNORMAL HIGH (ref 70–99)
HCT: 40 % (ref 36.0–46.0)
Hemoglobin: 13.6 g/dL (ref 12.0–15.0)
Potassium: 4.2 mmol/L (ref 3.5–5.1)
Sodium: 143 mmol/L (ref 135–145)
TCO2: 26 mmol/L (ref 22–32)

## 2023-09-10 SURGERY — CARDIOVERSION
Anesthesia: General

## 2023-09-10 MED ORDER — SODIUM CHLORIDE 0.9 % IV SOLN: INTRAVENOUS | Status: DC

## 2023-09-10 MED ORDER — PROPOFOL 10 MG/ML IV BOLUS
INTRAVENOUS | Status: DC | PRN
  Administered 2023-09-10: 50 mg via INTRAVENOUS
  Administered 2023-09-10: 20 mg via INTRAVENOUS

## 2023-09-10 MED ORDER — LIDOCAINE 2% (20 MG/ML) 5 ML SYRINGE
INTRAMUSCULAR | Status: DC | PRN
  Administered 2023-09-10: 60 mg via INTRAVENOUS

## 2023-09-10 SURGICAL SUPPLY — 1 items: PAD DEFIB RADIO PHYSIO CONN (PAD) ×1 IMPLANT

## 2023-09-10 NOTE — Discharge Instructions (Signed)
 Electrical Cardioversion Electrical cardioversion is the delivery of a jolt of electricity to restore a normal rhythm to the heart. A rhythm that is too fast or is not regular (arrhythmia) keeps the heart from pumping blood well. There is also another type of cardioversion called a chemical (pharmacologic) cardioversion. This is when your health care provider gives you one or more medicines to bring back your regular heart rhythm. Electrical cardioversion is done as a scheduled procedure for arrhythmiasthat are not life-threatening. Electrical cardioversion may also be done in an emergency for sudden life-threatening arrhythmias. Tell a health care provider about: Any allergies you have. All medicines you are taking, including vitamins, herbs, eye drops, creams, and over-the-counter medicines. Any problems you or family members have had with sedatives or anesthesia. Any bleeding problems you have. Any surgeries you have had, including a pacemaker, defibrillator, or other implanted device. Any medical conditions you have. Whether you are pregnant or may be pregnant. What are the risks? Your provider will talk with you about risks. These include: Allergic reactions to medicines. Irritation to the skin on your chest or back where the sticky pads (electrodes) or paddles were put during electrical cardioversion. A blood clot that breaks free and travels to other parts of your body, such as your brain. Return of a worse abnormal heart rhythm that will need to be treated with medicines, a pacemaker, or an implantable cardioverter defibrillator (ICD). What happens before the procedure? Medicines Your provider may give you: Blood-thinning medicines (anticoagulants) so your blood does not clot as easily. If your provider gives you this medicine, you may need to take it for 4 weeks before the procedure. Medicines to help stabilize your heart rate and rhythm. Ask your provider about: Changing or stopping  your regular medicines. These include any diabetes medicines or blood thinners you take. Taking medicines such as aspirin and ibuprofen. These medicines can thin your blood. Do not take them unless your provider tells you to. Taking over-the-counter medicines, vitamins, herbs, and supplements. General instructions Follow instructions from your provider about what you may eat and drink. Do not put any lotions, powders, or ointments on your chest and back for 24 hours before the procedure. They can cause problems with the electrodes or paddles used to deliver electricity to your heart. Do not wear jewelry as this can interfere with delivering electricity to your heart. If you will be going home right after the procedure, plan to have a responsible adult: Take you home from the hospital or clinic. You will not be allowed to drive. Care for you for the time you are told. Tests You may have an exam or testing. This may include: Blood labs. A transesophageal echocardiogram (TEE). What happens during the procedure?     An IV will be inserted into one of your veins. You will be given a sedative. This helps you relax. Electrodes or metal paddles will be placed on your chest. They may be placed in one of these ways: One placed on your right chest, the other on the left ribs. One placed on your chest and the other on your back. An electrical shock will be delivered. The shock briefly stops (resets) your heart rhythm. Your provider will check to see if your heart rhythm is now normal. Some people need only one shock. Some need more to restore a normal heart rhythm. The procedure may vary among providers and hospitals. What happens after the procedure? Your blood pressure, heart rate, breathing rate, and blood oxygen  level will be monitored until you leave the hospital or clinic. Your heart rhythm will be watched to make sure it does not change. This information is not intended to replace advice  given to you by your health care provider. Make sure you discuss any questions you have with your health care provider. Document Revised: 07/12/2022 Document Reviewed: 07/12/2022 Elsevier Patient Education  2024 [employer].

## 2023-09-10 NOTE — Telephone Encounter (Signed)
Called patient regarding provider Wynema Birch Meng,PA) requesting patient come in for office visit tomorrow 09/11/23 at 1:30pm patient will arrive at 1:15pm, patient verbalized understanding and agreed to office visit.

## 2023-09-10 NOTE — Interval H&P Note (Signed)
History and Physical Interval Note:  09/10/2023 10:51 AM  Victoria Delgado  has presented today for surgery, with the diagnosis of AFIB.  The various methods of treatment have been discussed with the patient and family. After consideration of risks, benefits and other options for treatment, the patient has consented to  Procedure(s): CARDIOVERSION (N/A) as a surgical intervention.  The patient's history has been reviewed, patient examined, no change in status, stable for surgery.  I have reviewed the patient's chart and labs.  Questions were answered to the patient's satisfaction.     Raider Valbuena Cristal Deer

## 2023-09-10 NOTE — Transfer of Care (Signed)
Immediate Anesthesia Transfer of Care Note  Patient: Victoria Delgado  Procedure(s) Performed: CARDIOVERSION  Patient Location: Cath Lab  Anesthesia Type:General  Level of Consciousness: drowsy  Airway & Oxygen Therapy: Patient Spontanous Breathing and Patient connected to nasal cannula oxygen  Post-op Assessment: Report given to RN and Post -op Vital signs reviewed and stable  Post vital signs: Reviewed and stable  Last Vitals:  Vitals Value Taken Time  BP    Temp    Pulse 89 09/10/23 1054  Resp 20 09/10/23 1054  SpO2 92 % 09/10/23 1054  Vitals shown include unfiled device data.  Last Pain:  Vitals:   09/10/23 0913  TempSrc: Temporal         Complications: No notable events documented.

## 2023-09-10 NOTE — CV Procedure (Signed)
Procedure:   DCCV  Indication:  Symptomatic atrial fibrillation/flutter  Procedure Note:  The patient signed informed consent.  They have had had therapeutic anticoagulation with apixaban greater than 3 weeks.  Anesthesia was administered by Dr. Desmond Lope.  Patient received 60 mg IV lidocaine and 70 mg IV propofol.Adequate airway was maintained throughout and vital followed per protocol.  They were cardioverted x 3 with 200, 250, 300J.  They converted to NSR for a short time after each shock, but then went into a regular rate that was consistent with an atypical flutter at 110 bpm. This occurred with each shock. Given that she did not hold sinus for long, we did not attempt additional cardioversion after the third shock.  There were no apparent complications.  The patient had normal neuro status and respiratory status post procedure with vitals stable as recorded elsewhere.    I contacted both Azalee Course and Dr. Antoine Poche to let them know about the results, so that they could consider antiarrhythmic therapy for her if they felt that was appropriate.  Follow up:  They will continue on current medical therapy and follow up with cardiology as scheduled.  Jodelle Red, MD PhD 09/10/2023 11:10 AM

## 2023-09-10 NOTE — Anesthesia Preprocedure Evaluation (Signed)
Anesthesia Evaluation  Patient identified by MRN, date of birth, ID band Patient awake    Reviewed: Allergy & Precautions, NPO status , Patient's Chart, lab work & pertinent test results, reviewed documented beta blocker date and time   Airway Mallampati: II  TM Distance: >3 FB Neck ROM: Full    Dental  (+) Dental Advisory Given, Missing, Caps   Pulmonary sleep apnea and Continuous Positive Airway Pressure Ventilation , former smoker   Pulmonary exam normal breath sounds clear to auscultation       Cardiovascular hypertension, Pt. on home beta blockers and Pt. on medications + CAD and + Cardiac Stents  + dysrhythmias Atrial Fibrillation  Rhythm:Irregular Rate:Abnormal     Neuro/Psych  Neuromuscular disease    GI/Hepatic Neg liver ROS,GERD  Medicated,,  Endo/Other    Morbid obesity  Renal/GU Renal disease     Musculoskeletal  (+) Arthritis ,    Abdominal   Peds  Hematology  (+) Blood dyscrasia (Eliquis)   Anesthesia Other Findings Day of surgery medications reviewed with the patient.  Reproductive/Obstetrics                             Anesthesia Physical Anesthesia Plan  ASA: 3  Anesthesia Plan: General   Post-op Pain Management: Minimal or no pain anticipated   Induction: Intravenous  PONV Risk Score and Plan: 3 and Treatment may vary due to age or medical condition and TIVA  Airway Management Planned: Mask  Additional Equipment:   Intra-op Plan:   Post-operative Plan:   Informed Consent: I have reviewed the patients History and Physical, chart, labs and discussed the procedure including the risks, benefits and alternatives for the proposed anesthesia with the patient or authorized representative who has indicated his/her understanding and acceptance.     Dental advisory given  Plan Discussed with: CRNA  Anesthesia Plan Comments:        Anesthesia Quick  Evaluation

## 2023-09-10 NOTE — Anesthesia Postprocedure Evaluation (Signed)
Anesthesia Post Note  Patient: Victoria Delgado  Procedure(s) Performed: CARDIOVERSION     Patient location during evaluation: Cath Lab Anesthesia Type: General Level of consciousness: awake and alert Pain management: pain level controlled Vital Signs Assessment: post-procedure vital signs reviewed and stable Respiratory status: spontaneous breathing, nonlabored ventilation, respiratory function stable and patient connected to nasal cannula oxygen Cardiovascular status: blood pressure returned to baseline and stable Postop Assessment: no apparent nausea or vomiting Anesthetic complications: no   No notable events documented.  Last Vitals:  Vitals:   09/10/23 1119 09/10/23 1120  BP:  (!) 139/96  Pulse:  (!) 121  Resp:  16  Temp: (!) 36.3 C   SpO2:  95%    Last Pain:  Vitals:   09/10/23 1119  TempSrc: Temporal  PainSc:                  Collene Schlichter

## 2023-09-11 ENCOUNTER — Encounter (HOSPITAL_COMMUNITY): Payer: Self-pay | Admitting: Cardiology

## 2023-09-11 ENCOUNTER — Ambulatory Visit: Payer: Medicare Other | Attending: Physician Assistant | Admitting: Physician Assistant

## 2023-09-11 ENCOUNTER — Ambulatory Visit: Payer: Medicare Other | Admitting: Cardiovascular Disease

## 2023-09-11 VITALS — BP 102/70 | HR 90 | Ht 68.0 in | Wt 275.6 lb

## 2023-09-11 DIAGNOSIS — I2510 Atherosclerotic heart disease of native coronary artery without angina pectoris: Secondary | ICD-10-CM | POA: Diagnosis not present

## 2023-09-11 DIAGNOSIS — I4819 Other persistent atrial fibrillation: Secondary | ICD-10-CM | POA: Diagnosis not present

## 2023-09-11 DIAGNOSIS — Z952 Presence of prosthetic heart valve: Secondary | ICD-10-CM | POA: Diagnosis not present

## 2023-09-11 DIAGNOSIS — I10 Essential (primary) hypertension: Secondary | ICD-10-CM | POA: Diagnosis not present

## 2023-09-11 NOTE — Progress Notes (Signed)
stenosed.  Intervention  No interventions have been documented.   STRESS TESTS  NM MYOCAR MULTI W/SPECT W 03/18/2018  Narrative  There was no ST segment deviation noted during stress.  Nuclear stress EF: 64%. No significant wall motion abnormalities detected  No T wave inversion was noted during stress.  Defect 1: There is a large defect of severe severity present in the basal anterior, basal anteroseptal, basal inferoseptal, mid anterior, mid anteroseptal, mid inferoseptal and apex location.  Findings consistent with ischemia.  This is a high risk study.  Donato Schultz, MD   ECHOCARDIOGRAM  ECHOCARDIOGRAM COMPLETE 12/06/2022  Narrative ECHOCARDIOGRAM REPORT    Patient Name:    Victoria Delgado Date of Exam: 12/06/2022 Medical Rec #:  [id number]     Height:       68.0 in Accession #:    [id number]    Weight:       278.0 lb Date of Birth:  04-11-1941    BSA:          2.351 m Patient Age:    82 years      BP:           109/62 mmHg Patient Gender: F             HR:           84 bpm. Exam Location:  Inpatient  Procedure: 2D Echo, Cardiac Doppler and Color Doppler  Indications:    Other abnormalities of the heart R00.8  History:        Patient has prior history of Echocardiogram examinations, most recent 11/04/2022. CAD and Angina, Arrythmias:Atrial Fibrillation and Tachycardia, Signs/Symptoms:Chest Pain and Shortness of Breath; Risk Factors:Hypertension, Sleep Apnea and Dyslipidemia. Aortic Valve: 23 mm Edwards Sapien prosthetic, stented (TAVR) valve is present in the aortic position. Procedure Date: 05/14/22.  Sonographer:    Lucendia Herrlich Sonographer#2:  Irving Burton Senior Referring Phys: [id number] SARA-MAIZ A Pinkley  IMPRESSIONS   1. Left ventricular ejection fraction, by estimation, is 70 to 75%. The left ventricle has hyperdynamic function. The left ventricle has no regional wall motion abnormalities. There is moderate left ventricular hypertrophy. Left ventricular diastolic parameters are indeterminate. 2. Resting peak LVOT gradient 84 mmHg 3. Right ventricular systolic function is normal. The right ventricular size is normal. Tricuspid regurgitation signal is inadequate for assessing PA pressure. 4. Left atrial size was mildly dilated. 5. A small pericardial effusion is present. 6. The mitral valve is degenerative. Trivial mitral valve regurgitation. Severe mitral stenosis. The mean mitral valve gradient is 16.0 mmHg with average heart rate of 82 bpm. Severe mitral annular calcification. 7. The aortic valve has been repaired/replaced. Aortic valve regurgitation is not visualized. There is a 23 mm Edwards Sapien prosthetic (TAVR) valve present in the aortic  position. Procedure Date: 05/14/22. Vmax 3.2 m/s, MG 20 mmHg, EOA 2.2 cm^2, DI 0.58  FINDINGS Left Ventricle: Left ventricular ejection fraction, by estimation, is 70 to 75%. The left ventricle has hyperdynamic function. The left ventricle has no regional wall motion abnormalities. The left ventricular internal cavity size was normal in size. There is moderate left ventricular hypertrophy. Left ventricular diastolic parameters are indeterminate.  Right Ventricle: The right ventricular size is normal. No increase in right ventricular wall thickness. Right ventricular systolic function is normal. Tricuspid regurgitation signal is inadequate for assessing PA pressure.  Left Atrium: Left atrial size was mildly dilated.  Right Atrium: Right atrial size was normal in size.  Pericardium: A small pericardial effusion is present. Presence  stenosed.  Intervention  No interventions have been documented.   STRESS TESTS  NM MYOCAR MULTI W/SPECT W 03/18/2018  Narrative  There was no ST segment deviation noted during stress.  Nuclear stress EF: 64%. No significant wall motion abnormalities detected  No T wave inversion was noted during stress.  Defect 1: There is a large defect of severe severity present in the basal anterior, basal anteroseptal, basal inferoseptal, mid anterior, mid anteroseptal, mid inferoseptal and apex location.  Findings consistent with ischemia.  This is a high risk study.  Donato Schultz, MD   ECHOCARDIOGRAM  ECHOCARDIOGRAM COMPLETE 12/06/2022  Narrative ECHOCARDIOGRAM REPORT    Patient Name:    Victoria Delgado Date of Exam: 12/06/2022 Medical Rec #:  [id number]     Height:       68.0 in Accession #:    [id number]    Weight:       278.0 lb Date of Birth:  04-11-1941    BSA:          2.351 m Patient Age:    82 years      BP:           109/62 mmHg Patient Gender: F             HR:           84 bpm. Exam Location:  Inpatient  Procedure: 2D Echo, Cardiac Doppler and Color Doppler  Indications:    Other abnormalities of the heart R00.8  History:        Patient has prior history of Echocardiogram examinations, most recent 11/04/2022. CAD and Angina, Arrythmias:Atrial Fibrillation and Tachycardia, Signs/Symptoms:Chest Pain and Shortness of Breath; Risk Factors:Hypertension, Sleep Apnea and Dyslipidemia. Aortic Valve: 23 mm Edwards Sapien prosthetic, stented (TAVR) valve is present in the aortic position. Procedure Date: 05/14/22.  Sonographer:    Lucendia Herrlich Sonographer#2:  Irving Burton Senior Referring Phys: [id number] SARA-MAIZ A Pinkley  IMPRESSIONS   1. Left ventricular ejection fraction, by estimation, is 70 to 75%. The left ventricle has hyperdynamic function. The left ventricle has no regional wall motion abnormalities. There is moderate left ventricular hypertrophy. Left ventricular diastolic parameters are indeterminate. 2. Resting peak LVOT gradient 84 mmHg 3. Right ventricular systolic function is normal. The right ventricular size is normal. Tricuspid regurgitation signal is inadequate for assessing PA pressure. 4. Left atrial size was mildly dilated. 5. A small pericardial effusion is present. 6. The mitral valve is degenerative. Trivial mitral valve regurgitation. Severe mitral stenosis. The mean mitral valve gradient is 16.0 mmHg with average heart rate of 82 bpm. Severe mitral annular calcification. 7. The aortic valve has been repaired/replaced. Aortic valve regurgitation is not visualized. There is a 23 mm Edwards Sapien prosthetic (TAVR) valve present in the aortic  position. Procedure Date: 05/14/22. Vmax 3.2 m/s, MG 20 mmHg, EOA 2.2 cm^2, DI 0.58  FINDINGS Left Ventricle: Left ventricular ejection fraction, by estimation, is 70 to 75%. The left ventricle has hyperdynamic function. The left ventricle has no regional wall motion abnormalities. The left ventricular internal cavity size was normal in size. There is moderate left ventricular hypertrophy. Left ventricular diastolic parameters are indeterminate.  Right Ventricle: The right ventricular size is normal. No increase in right ventricular wall thickness. Right ventricular systolic function is normal. Tricuspid regurgitation signal is inadequate for assessing PA pressure.  Left Atrium: Left atrial size was mildly dilated.  Right Atrium: Right atrial size was normal in size.  Pericardium: A small pericardial effusion is present. Presence  Cardiology Office Note:  .   Date:  09/11/2023  ID:  Victoria Delgado, DOB March 06, 1941, MRN [id number] PCP: Charlane Ferretti, DO  [facility] Providers Cardiologist:  Rollene Rotunda, MD     History of Present Illness: .   Victoria Delgado is a 82 y.o. female with PMH of CAD, asymmetric septal hypertrophy, A-fib, history AS s/p TAVR, mitral stenosis, sleep apnea, hypertension and hyperlipidemia. Patient was admitted at [facility] in May 2017 with left arm pain. She had a positive troponin and underwent PCI of left circumflex and the first diagonal. She was discharged on aspirin and Effient. Echocardiogram showed preserved EF but moderate aortic stenosis. She underwent cardiac catheterization again in April 2019 for chest pain, this showed patent stent in the proximal to mid left circumflex artery, 20% ostial left circumflex artery lesion, 40% OM 3 lesion, widely patent stent in ostial D1, 20% mid LAD lesion. Cardiac catheterization did confirm moderate aortic stenosis with mean gradient 26.7 mmHg. Medical therapy was recommended. She eventually underwent TAVR at [facility] as her aortic stenosis progressed to severe range. Patient was admitted in December with COVID and had atrial fibrillation with RVR. She was treated with IV amiodarone and sent home on oral amiodarone and anticoagulation therapy. Echocardiogram obtained during the admission showed normal TAVR gradient with moderate mitral stenosis.   Patient was readmitted in January 2024 due to acute respiratory failure in the setting of RSV and COPD exacerbation.  Troponin was up to 1300.  Initial plan was for left and right heart cath however patient complained of abdominal pain.  Subsequent CT showed a large intramuscular hemorrhage in the right rectus sheath.  Eliquis was discontinued and she was switched to aspirin.  Echocardiogram obtained on 04/25/2023 showed stable TAVR valve, hyperdynamic LV function, moderate mitral stenosis with mean  gradient 9 mmHg.  Patient presented to ED in early September twice with palpitation.  The first time she was in A-fib however spontaneously converted to sinus rhythm.  However since she had recurrence of A-fib on 08/12/2023, she has been in persistent A-fib since.  I restarted her on Eliquis.  I previously increased her metoprolol succinate to 50 mg a.m. and 25 mg p.m.  She was scheduled to undergo outpatient DC cardioversion.  She was shocked 3 different times and converted back to sinus rhythm briefly after each cycle however went back into atypical atrial flutter with a heart rate of 110.  Given failed cardioversion, it was felt patient likely will require antiarrhythmic therapy to hold to sinus rhythm.  ROS:   She denies chest pain, palpitations, dyspnea, pnd, orthopnea, n, v, dizziness, syncope, edema, weight gain, or early satiety. All other systems reviewed and are otherwise negative except as noted above.    Studies Reviewed: .        Cardiac Studies & Procedures   CARDIAC CATHETERIZATION  CARDIAC CATHETERIZATION 07/11/2020  Narrative  Non-stenotic Ost 1st Diag to 1st Diag lesion was previously treated.  Ost LAD to Prox LAD lesion is 20% stenosed.  Mid LAD lesion is 20% stenosed.  Ost 3rd Mrg lesion is 40% stenosed.  Non-stenotic Prox Cx to Mid Cx lesion was previously treated.  Ost Cx to Prox Cx lesion is 20% stenosed.  Mid RCA lesion is 10% stenosed.  Ost LM lesion is 30% stenosed.  There is severe aortic valve stenosis.  1.  Nonobstructive coronary artery disease with continued patency of the stented segments in the first diagonal and mid circumflex 2.  Cardiology Office Note:  .   Date:  09/11/2023  ID:  Victoria Delgado, DOB March 06, 1941, MRN [id number] PCP: Charlane Ferretti, DO  [facility] Providers Cardiologist:  Rollene Rotunda, MD     History of Present Illness: .   Victoria Delgado is a 82 y.o. female with PMH of CAD, asymmetric septal hypertrophy, A-fib, history AS s/p TAVR, mitral stenosis, sleep apnea, hypertension and hyperlipidemia. Patient was admitted at [facility] in May 2017 with left arm pain. She had a positive troponin and underwent PCI of left circumflex and the first diagonal. She was discharged on aspirin and Effient. Echocardiogram showed preserved EF but moderate aortic stenosis. She underwent cardiac catheterization again in April 2019 for chest pain, this showed patent stent in the proximal to mid left circumflex artery, 20% ostial left circumflex artery lesion, 40% OM 3 lesion, widely patent stent in ostial D1, 20% mid LAD lesion. Cardiac catheterization did confirm moderate aortic stenosis with mean gradient 26.7 mmHg. Medical therapy was recommended. She eventually underwent TAVR at [facility] as her aortic stenosis progressed to severe range. Patient was admitted in December with COVID and had atrial fibrillation with RVR. She was treated with IV amiodarone and sent home on oral amiodarone and anticoagulation therapy. Echocardiogram obtained during the admission showed normal TAVR gradient with moderate mitral stenosis.   Patient was readmitted in January 2024 due to acute respiratory failure in the setting of RSV and COPD exacerbation.  Troponin was up to 1300.  Initial plan was for left and right heart cath however patient complained of abdominal pain.  Subsequent CT showed a large intramuscular hemorrhage in the right rectus sheath.  Eliquis was discontinued and she was switched to aspirin.  Echocardiogram obtained on 04/25/2023 showed stable TAVR valve, hyperdynamic LV function, moderate mitral stenosis with mean  gradient 9 mmHg.  Patient presented to ED in early September twice with palpitation.  The first time she was in A-fib however spontaneously converted to sinus rhythm.  However since she had recurrence of A-fib on 08/12/2023, she has been in persistent A-fib since.  I restarted her on Eliquis.  I previously increased her metoprolol succinate to 50 mg a.m. and 25 mg p.m.  She was scheduled to undergo outpatient DC cardioversion.  She was shocked 3 different times and converted back to sinus rhythm briefly after each cycle however went back into atypical atrial flutter with a heart rate of 110.  Given failed cardioversion, it was felt patient likely will require antiarrhythmic therapy to hold to sinus rhythm.  ROS:   She denies chest pain, palpitations, dyspnea, pnd, orthopnea, n, v, dizziness, syncope, edema, weight gain, or early satiety. All other systems reviewed and are otherwise negative except as noted above.    Studies Reviewed: .        Cardiac Studies & Procedures   CARDIAC CATHETERIZATION  CARDIAC CATHETERIZATION 07/11/2020  Narrative  Non-stenotic Ost 1st Diag to 1st Diag lesion was previously treated.  Ost LAD to Prox LAD lesion is 20% stenosed.  Mid LAD lesion is 20% stenosed.  Ost 3rd Mrg lesion is 40% stenosed.  Non-stenotic Prox Cx to Mid Cx lesion was previously treated.  Ost Cx to Prox Cx lesion is 20% stenosed.  Mid RCA lesion is 10% stenosed.  Ost LM lesion is 30% stenosed.  There is severe aortic valve stenosis.  1.  Nonobstructive coronary artery disease with continued patency of the stented segments in the first diagonal and mid circumflex 2.  Cardiology Office Note:  .   Date:  09/11/2023  ID:  Victoria Delgado, DOB March 06, 1941, MRN [id number] PCP: Charlane Ferretti, DO  [facility] Providers Cardiologist:  Rollene Rotunda, MD     History of Present Illness: .   Victoria Delgado is a 82 y.o. female with PMH of CAD, asymmetric septal hypertrophy, A-fib, history AS s/p TAVR, mitral stenosis, sleep apnea, hypertension and hyperlipidemia. Patient was admitted at [facility] in May 2017 with left arm pain. She had a positive troponin and underwent PCI of left circumflex and the first diagonal. She was discharged on aspirin and Effient. Echocardiogram showed preserved EF but moderate aortic stenosis. She underwent cardiac catheterization again in April 2019 for chest pain, this showed patent stent in the proximal to mid left circumflex artery, 20% ostial left circumflex artery lesion, 40% OM 3 lesion, widely patent stent in ostial D1, 20% mid LAD lesion. Cardiac catheterization did confirm moderate aortic stenosis with mean gradient 26.7 mmHg. Medical therapy was recommended. She eventually underwent TAVR at [facility] as her aortic stenosis progressed to severe range. Patient was admitted in December with COVID and had atrial fibrillation with RVR. She was treated with IV amiodarone and sent home on oral amiodarone and anticoagulation therapy. Echocardiogram obtained during the admission showed normal TAVR gradient with moderate mitral stenosis.   Patient was readmitted in January 2024 due to acute respiratory failure in the setting of RSV and COPD exacerbation.  Troponin was up to 1300.  Initial plan was for left and right heart cath however patient complained of abdominal pain.  Subsequent CT showed a large intramuscular hemorrhage in the right rectus sheath.  Eliquis was discontinued and she was switched to aspirin.  Echocardiogram obtained on 04/25/2023 showed stable TAVR valve, hyperdynamic LV function, moderate mitral stenosis with mean  gradient 9 mmHg.  Patient presented to ED in early September twice with palpitation.  The first time she was in A-fib however spontaneously converted to sinus rhythm.  However since she had recurrence of A-fib on 08/12/2023, she has been in persistent A-fib since.  I restarted her on Eliquis.  I previously increased her metoprolol succinate to 50 mg a.m. and 25 mg p.m.  She was scheduled to undergo outpatient DC cardioversion.  She was shocked 3 different times and converted back to sinus rhythm briefly after each cycle however went back into atypical atrial flutter with a heart rate of 110.  Given failed cardioversion, it was felt patient likely will require antiarrhythmic therapy to hold to sinus rhythm.  ROS:   She denies chest pain, palpitations, dyspnea, pnd, orthopnea, n, v, dizziness, syncope, edema, weight gain, or early satiety. All other systems reviewed and are otherwise negative except as noted above.    Studies Reviewed: .        Cardiac Studies & Procedures   CARDIAC CATHETERIZATION  CARDIAC CATHETERIZATION 07/11/2020  Narrative  Non-stenotic Ost 1st Diag to 1st Diag lesion was previously treated.  Ost LAD to Prox LAD lesion is 20% stenosed.  Mid LAD lesion is 20% stenosed.  Ost 3rd Mrg lesion is 40% stenosed.  Non-stenotic Prox Cx to Mid Cx lesion was previously treated.  Ost Cx to Prox Cx lesion is 20% stenosed.  Mid RCA lesion is 10% stenosed.  Ost LM lesion is 30% stenosed.  There is severe aortic valve stenosis.  1.  Nonobstructive coronary artery disease with continued patency of the stented segments in the first diagonal and mid circumflex 2.

## 2023-09-11 NOTE — Patient Instructions (Signed)
Medication Instructions:  STOP JARDIANCE *If you need a refill on your cardiac medications before your next appointment, please call your pharmacy*   Lab Work: NO LABS If you have labs (blood work) drawn today and your tests are completely normal, you will receive your results only by: MyChart Message (if you have MyChart) OR A paper copy in the mail If you have any lab test that is abnormal or we need to change your treatment, we will call you to review the results.   Testing/Procedures: NO TESTING   Follow-Up: At [facility], you and your health needs are our priority.  As part of our continuing mission to provide you with exceptional heart care, we have created designated Provider Care Teams.  These Care Teams include your primary Cardiologist (physician) and Advanced Practice Providers (APPs -  Physician Assistants and Nurse Practitioners) who all work together to provide you with the care you need, when you need it.   Your next appointment:   2-3 month(s)  Provider:   Azalee Course PA

## 2023-09-26 ENCOUNTER — Other Ambulatory Visit: Payer: Self-pay

## 2023-09-26 ENCOUNTER — Encounter (HOSPITAL_COMMUNITY): Payer: Self-pay

## 2023-09-26 ENCOUNTER — Emergency Department (HOSPITAL_COMMUNITY)
Admission: EM | Admit: 2023-09-26 | Discharge: 2023-09-26 | Disposition: A | Discharge status: Home / Self Care | Payer: Medicare Other | Attending: Emergency Medicine | Admitting: Emergency Medicine

## 2023-09-26 ENCOUNTER — Emergency Department (HOSPITAL_COMMUNITY): Payer: Medicare Other

## 2023-09-26 DIAGNOSIS — Z79899 Other long term (current) drug therapy: Secondary | ICD-10-CM | POA: Insufficient documentation

## 2023-09-26 DIAGNOSIS — I4891 Unspecified atrial fibrillation: Secondary | ICD-10-CM | POA: Diagnosis not present

## 2023-09-26 DIAGNOSIS — Z7901 Long term (current) use of anticoagulants: Secondary | ICD-10-CM | POA: Insufficient documentation

## 2023-09-26 DIAGNOSIS — I10 Essential (primary) hypertension: Secondary | ICD-10-CM | POA: Insufficient documentation

## 2023-09-26 DIAGNOSIS — I2510 Atherosclerotic heart disease of native coronary artery without angina pectoris: Secondary | ICD-10-CM | POA: Insufficient documentation

## 2023-09-26 DIAGNOSIS — R0602 Shortness of breath: Secondary | ICD-10-CM | POA: Diagnosis present

## 2023-09-26 LAB — BASIC METABOLIC PANEL
Anion gap: 14 (ref 5–15)
BUN: 14 mg/dL (ref 8–23)
CO2: 22 mmol/L (ref 22–32)
Calcium: 9.1 mg/dL (ref 8.9–10.3)
Chloride: 105 mmol/L (ref 98–111)
Creatinine, Ser: 0.76 mg/dL (ref 0.44–1.00)
GFR, Estimated: >60 mL/min (ref >60)
Glucose, Bld: 187 mg/dL — ABNORMAL HIGH (ref 70–99)
Potassium: 4 mmol/L (ref 3.5–5.1)
Sodium: 141 mmol/L (ref 135–145)

## 2023-09-26 LAB — CBC
HCT: 44.9 % (ref 36.0–46.0)
Hemoglobin: 14.7 g/dL (ref 12.0–15.0)
MCH: 27.9 pg (ref 26.0–34.0)
MCHC: 32.7 g/dL (ref 30.0–36.0)
MCV: 85.4 fL (ref 80.0–100.0)
Platelets: 309 10*3/uL (ref 150–400)
RBC: 5.26 MIL/uL — ABNORMAL HIGH (ref 3.87–5.11)
RDW: 18.5 % — ABNORMAL HIGH (ref 11.5–15.5)
WBC: 8.9 10*3/uL (ref 4.0–10.5)
nRBC: 0.2 % (ref 0.0–0.2)

## 2023-09-26 LAB — PROTIME-INR
INR: 1.3 — ABNORMAL HIGH (ref 0.8–1.2)
Prothrombin Time: 16 s — ABNORMAL HIGH (ref 11.4–15.2)

## 2023-09-26 MED ORDER — AMIODARONE HCL 200 MG PO TABS: ORAL_TABLET | ORAL | 0 refills | Status: DC

## 2023-09-26 NOTE — Progress Notes (Signed)
Presented with early recurrence of atrial fibrillation, again w RVR despite metoprolol. Had failed DCCV on 10/08, but converted spontaneously on 10/09. She is compliant with anticoagulation. As we were preparing for another CV, she spontaneously converted to NSR. Will start oral amiodarone and plan follow up in the AFib clinic within the next couple of weeks.

## 2023-09-26 NOTE — ED Triage Notes (Signed)
Patient BIB EMS from home. H/O A-fib RVR cardioversion on 10/03. Palpations and SOB started today. HR 80/140. 120/90, 97%RA, 20gRFA.  Marland Kitchen

## 2023-09-26 NOTE — Discharge Instructions (Addendum)
You were seen for your atrial fibrillation in the emergency department.   At home, please continue your blood thinner and start taking the amiodarone in addition to your metoprolol for your heart rate.    Check your MyChart online for the results of any tests that had not resulted by the time you left the emergency department.   Follow-up with your primary doctor in 2-3 days regarding your visit.  Follow-up with A-fib clinic.  Return immediately to the emergency department if you experience any of the following: Palpitations, chest pain, or any other concerning symptoms.    Thank you for visiting our Emergency Department. It was a pleasure taking care of you today.

## 2023-09-26 NOTE — ED Provider Notes (Signed)
[facility] EMERGENCY DEPARTMENT AT [facility] Provider Note   CSN: [id number] Arrival date & time: 09/26/23  1302     History  Chief Complaint  Patient presents with   Palpitations   Shortness of Breath    Victoria Delgado is a 82 y.o. female.  82 year old female with history of paroxysmal atrial fibrillation on metoprolol and Eliquis, CAD status post PCI, aortic stenosis status post TAVR, mitral stenosis, hypertension, and hyperlipidemia who presents emergency department palpitations.  This morning she woke and was having palpitations.  Denies any chest pain.  Says she has some mild shortness of breath with it.  Had a cardioversion on 09/05/2023 but reports that it was not successful initially but that she did go back into normal sinus rhythm after going home.  Has been on her Eliquis since mid September.       Home Medications Prior to Admission medications   Medication Sig Start Date End Date Taking? Authorizing Provider  amiodarone (PACERONE) 200 MG tablet Take 1 tablet (200 mg total) by mouth 2 (two) times daily for 30 days, THEN 1 tablet (200 mg total) daily. 09/26/23 11/25/23 Yes Rondel Baton, MD  ACETAMINOPHEN PO Take 650 mg by mouth daily as needed for moderate pain or headache.    [provider]  apixaban (ELIQUIS) 5 MG TABS tablet Take 1 tablet (5 mg total) by mouth 2 (two) times daily. 08/14/23   Azalee Course, PA  atropine 1 % ophthalmic solution Place 1 drop into the right eye daily.    [provider]  beclomethasone (QVAR) 80 MCG/ACT inhaler Inhale 2 puffs into the lungs 2 (two) times daily. 05/30/23   Omar Person, MD  cholecalciferol (VITAMIN D3) 25 MCG (1000 UNIT) tablet Take 1,000 Units by mouth 2 (two) times daily.    [provider]  Coenzyme Q10 (COQ10) 100 MG CAPS Take 100 mg by mouth daily.    [provider]  CRESTOR 20 MG tablet TAKE 1 TABLET(20 MG) BY MOUTH DAILY 01/30/23   Rollene Rotunda, MD  ezetimibe  (ZETIA) 10 MG tablet TAKE 1 TABLET(10 MG) BY MOUTH DAILY 08/20/23   Azalee Course, PA  furosemide (LASIX) 40 MG tablet Take 1 tablet (40 mg total) by mouth daily. 06/17/23   Rollene Rotunda, MD  levalbuterol [facility] HFA) 45 MCG/ACT inhaler Inhale 1-2 puffs into the lungs every 6 (six) hours as needed for wheezing. 06/26/22   Omar Person, MD  metoprolol succinate (TOPROL-XL) 50 MG 24 hr tablet Take whole tablet in the morning with or immediately following a meal and 1/2 tablet in the afternoon. 08/20/23   Azalee Course, PA  nitroGLYCERIN (NITROSTAT) 0.4 MG SL tablet Place 1 tablet (0.4 mg total) under the tongue every 5 (five) minutes as needed for chest pain. 05/07/16   Rosalio Macadamia, NP  omeprazole (PRILOSEC) 20 MG capsule Take 20 mg by mouth 2 (two) times daily before a meal.    [provider]  Propylene Glycol (SYSTANE BALANCE) 0.6 % SOLN Place 1 drop into both eyes 2 (two) times daily as needed (dry eyes).    [provider]  Tiotropium Bromide Monohydrate (SPIRIVA RESPIMAT) 1.25 MCG/ACT AERS Inhale 2 puffs into the lungs daily. 05/30/23   Omar Person, MD      Allergies    Codeine, Kenalog [triamcinolone acetonide], Kenalog [triamcinolone], Pantoprazole, Relafen [nabumetone], Albuterol, Epinephrine, Penicillins, and Tramadol    Review of Systems   Review of Systems  Physical Exam  Updated Vital Signs BP (!) 105/58   Pulse 70   Temp 98.4 F (36.9 C) (Oral)   Resp 14   Ht 5\' 8"  (1.727 m)   Wt 122.5 kg   SpO2 93%   BMI 41.05 kg/m  Physical Exam Vitals and nursing note reviewed.  Constitutional:      General: She is not in acute distress.    Appearance: She is well-developed.  HENT:     Head: Normocephalic and atraumatic.     Right Ear: External ear normal.     Left Ear: External ear normal.     Nose: Nose normal.  Eyes:     Extraocular Movements: Extraocular movements intact.     Conjunctiva/sclera: Conjunctivae normal.     Pupils: Pupils are equal,  round, and reactive to light.  Cardiovascular:     Rate and Rhythm: Tachycardia present. Rhythm irregular.     Heart sounds: No murmur heard. Pulmonary:     Effort: Pulmonary effort is normal. No respiratory distress.     Breath sounds: Normal breath sounds.  Musculoskeletal:     Cervical back: Normal range of motion and neck supple.     Right lower leg: Edema (1+) present.     Left lower leg: Edema (1+) present.  Skin:    General: Skin is warm and dry.  Neurological:     Mental Status: She is alert and oriented to person, place, and time. Mental status is at baseline.  Psychiatric:        Mood and Affect: Mood normal.     ED Results / Procedures / Treatments   Labs (all labs ordered are listed, but only abnormal results are displayed) Labs Reviewed  BASIC METABOLIC PANEL - Abnormal; Notable for the following components:      Result Value   Glucose, Bld 187 (*)    All other components within normal limits  CBC - Abnormal; Notable for the following components:   RBC 5.26 (*)    RDW 18.5 (*)    All other components within normal limits  PROTIME-INR - Abnormal; Notable for the following components:   Prothrombin Time 16.0 (*)    INR 1.3 (*)    All other components within normal limits    EKG EKG Interpretation Date/Time:  Thursday September 26 2023 12:56:19 EDT Ventricular Rate:  142 PR Interval:    QRS Duration:  90 QT Interval:  322 QTC Calculation: 495 R Axis:   257  Text Interpretation: Atrial fibrillation with rapid ventricular response Right superior axis deviation Pulmonary disease pattern Right ventricular hypertrophy Abnormal ECG Confirmed by Vonita Moss [phone]) on 09/26/2023 1:36:52 PM  Radiology DG Chest Port 1 View  Result Date: 09/26/2023 CLINICAL DATA:  Atrial fibrillation. EXAM: PORTABLE CHEST 1 VIEW COMPARISON:  08/12/2023 x-ray FINDINGS: Enlarged cardiopericardial silhouette. Calcified aorta. No consolidation, pneumothorax or effusion. No edema.  Overlapping cardiac leads. Degenerative changes along the spine. Chronic interstitial changes suggested. IMPRESSION: Hyperinflation with chronic changes. Enlarged heart. Calcified aorta. Electronically Signed   By: Karen Kays M.D.   On: 09/26/2023 15:37    Procedures Procedures    Medications Ordered in ED Medications - No data to display  ED Course/ Medical Decision Making/ A&P Clinical Course as of 09/26/23 2133  Thu Sep 26, 2023  1441 Dr Croitoru from cardiology consulted.  Recommends amiodarone 200 mg twice daily for 4 weeks followed by 200 mg daily if patient cardioverted here in the emergency department.  Otherwise recommends admission.   [RP]  Clinical Course User Index [RP] Rondel Baton, MD                                 Medical Decision Making Risk Prescription drug management.   Victoria Delgado is a 82 y.o. female with comorbidities that complicate the patient evaluation including paroxysmal atrial fibrillation on metoprolol and Eliquis, CAD status post PCI, aortic stenosis status post TAVR, mitral stenosis, hypertension, and hyperlipidemia who presents emergency department palpitations.     Initial Ddx:  Atrial fibrillation RVR, electrolyte abnormality, heart failure exacerbation  MDM/Course:  Patient presents to the emergency department with palpitations.  No chest pain at all.  No clear causative factor.  A-fib with RVR today.  Was discussed with cardiology since she had a failed cardioversion recently.  They recommended attempted cardioversion here in the emergency department and then amiodarone if successfully converted.  If failed and recommended IV amiodarone load and admission to the hospital.  When we are preparing for the cardioversion the patient spontaneously converted to normal sinus rhythm.  She was ordered for p.o. amiodarone to take at home and will have her follow-up with A-fib clinic.  Return precautions discussed prior to discharge.   This patient  presents to the ED for concern of complaints listed in HPI, this involves an extensive number of treatment options, and is a complaint that carries with it a high risk of complications and morbidity. Disposition including potential need for admission considered.   Dispo: DC Home. Return precautions discussed including, but not limited to, those listed in the AVS. Allowed pt time to ask questions which were answered fully prior to dc.  Additional history obtained from spouse Records reviewed Outpatient Clinic Notes The following labs were independently interpreted: Chemistry and show no acute abnormality I independently reviewed the following imaging with scope of interpretation limited to determining acute life threatening conditions related to emergency care: Chest x-ray and agree with the radiologist interpretation with the following exceptions: none I personally reviewed and interpreted cardiac monitoring: normal sinus rhythm  and atrial fibrillation with RVR I personally reviewed and interpreted the pt's EKG: see above for interpretation  I have reviewed the patients home medications and made adjustments as needed Consults: Cardiology Social Determinants of health:  Elderly  Portions of this note were generated with Scientist, clinical (histocompatibility and immunogenetics). Dictation errors may occur despite best attempts at proofreading.           Final Clinical Impression(s) / ED Diagnoses Final diagnoses:  Atrial fibrillation with RVR (HCC)    Rx / DC Orders ED Discharge Orders          Ordered    amiodarone (PACERONE) 200 MG tablet  Multiple Frequencies        09/26/23 1506    Amb Referral to AFIB Clinic        09/26/23 1507              Rondel Baton, MD 09/26/23 2133

## 2023-09-30 ENCOUNTER — Ambulatory Visit: Payer: Medicare Other | Admitting: Physician Assistant

## 2023-10-10 ENCOUNTER — Encounter (HOSPITAL_COMMUNITY): Payer: Self-pay | Admitting: Physician Assistant

## 2023-10-10 ENCOUNTER — Ambulatory Visit (HOSPITAL_COMMUNITY)
Admit: 2023-10-10 | Discharge: 2023-10-10 | Disposition: A | Discharge status: Home / Self Care | Payer: Medicare Other | Attending: Physician Assistant | Admitting: Physician Assistant

## 2023-10-10 ENCOUNTER — Other Ambulatory Visit: Payer: Self-pay

## 2023-10-10 VITALS — BP 118/64 | HR 97 | Ht 68.0 in | Wt 270.0 lb

## 2023-10-10 DIAGNOSIS — Z79899 Other long term (current) drug therapy: Secondary | ICD-10-CM | POA: Diagnosis not present

## 2023-10-10 DIAGNOSIS — E669 Obesity, unspecified: Secondary | ICD-10-CM | POA: Diagnosis not present

## 2023-10-10 DIAGNOSIS — I4819 Other persistent atrial fibrillation: Secondary | ICD-10-CM | POA: Diagnosis not present

## 2023-10-10 DIAGNOSIS — I4891 Unspecified atrial fibrillation: Secondary | ICD-10-CM | POA: Diagnosis present

## 2023-10-10 DIAGNOSIS — Z5181 Encounter for therapeutic drug level monitoring: Secondary | ICD-10-CM | POA: Diagnosis not present

## 2023-10-10 DIAGNOSIS — Z7901 Long term (current) use of anticoagulants: Secondary | ICD-10-CM | POA: Diagnosis not present

## 2023-10-10 DIAGNOSIS — I088 Other rheumatic multiple valve diseases: Secondary | ICD-10-CM | POA: Diagnosis not present

## 2023-10-10 DIAGNOSIS — I2510 Atherosclerotic heart disease of native coronary artery without angina pectoris: Secondary | ICD-10-CM | POA: Insufficient documentation

## 2023-10-10 DIAGNOSIS — I10 Essential (primary) hypertension: Secondary | ICD-10-CM | POA: Diagnosis not present

## 2023-10-10 DIAGNOSIS — E785 Hyperlipidemia, unspecified: Secondary | ICD-10-CM | POA: Diagnosis not present

## 2023-10-10 DIAGNOSIS — D6869 Other thrombophilia: Secondary | ICD-10-CM | POA: Insufficient documentation

## 2023-10-10 DIAGNOSIS — G4733 Obstructive sleep apnea (adult) (pediatric): Secondary | ICD-10-CM | POA: Diagnosis not present

## 2023-10-10 DIAGNOSIS — I444 Left anterior fascicular block: Secondary | ICD-10-CM | POA: Insufficient documentation

## 2023-10-10 DIAGNOSIS — Z952 Presence of prosthetic heart valve: Secondary | ICD-10-CM | POA: Insufficient documentation

## 2023-10-10 DIAGNOSIS — Z6841 Body Mass Index (BMI) 40.0 and over, adult: Secondary | ICD-10-CM | POA: Insufficient documentation

## 2023-10-10 NOTE — Progress Notes (Signed)
Primary Care Physician: Charlane Ferretti, DO Primary Cardiologist: Dr Antoine Poche  Primary Electrophysiologist: Dr Lalla Brothers Referring Physician: Dr Bennie Hind Victoria Delgado is a 82 y.o. female with a history of CAD, AS s/p TAVR 09/2020, moderate MS, HTN, HOCM, OSA, HLD, GI bleed on Eliquis, atrial fibrillation who presents for follow up in the [facility]. Presented to ER 11/23/21 with chest pain for 1 hour, she thought her atrial fib was starting back up. HR was 140 by EMS. In ER given Cardizem bout with drop in BP to 70s systolic and then given fluid bolus.  Also noted she had been sick for 3-4 days prior to presentation.  Cough, congestion and fever and found to be COVID +. Once HR was slowed pt's chest pain resolved. She was also acutely fluid overloaded and started on diuresis. Patient is on Eliquis for a CHADS2VASC score of 5. She was started on amiodarone with plan for outpatient DCCV. Patient is s/p DCCV on 01/03/22. She had done well and her amiodarone was eventually discontinued. However, she had multiple ED visits and one additional DCCV on 09/10/23. Amiodarone was resumed during an ED visit on 09/26/23.  On follow up today, patient reports that she has remained in SR since resuming amiodarone. However, she is having sleep disturbances. No bleeding issues on anticoagulation.   Today, she denies symptoms of palpitations, chest pain, orthopnea, PND, lower extremity edema, dizziness, presyncope, syncope, snoring, daytime somnolence, bleeding, or neurologic sequela. The patient is tolerating medications without difficulties and is otherwise without complaint today.    Atrial Fibrillation Risk Factors:  she does have symptoms or diagnosis of sleep apnea. she is compliant with CPAP therapy. she does not have a history of rheumatic fever.   Atrial Fibrillation Management history:  Previous antiarrhythmic drugs: amiodarone  Previous cardioversions: 01/03/22,  09/10/23 Previous ablations: none Anticoagulation history: Eliquis   Past Medical History:  Diagnosis Date   Abdominal pain    Arthritis    osteoarthritis-hips,knees, back, hands   Asymmetric septal hypertrophy    Atrial fibrillation (HCC)    CAD (coronary artery disease)    2 drug-eluting stents placed in the circumflex leading into a marginal.  May 2017.  [name].   catheterization on 03/19/2018, this showed patent stent in the proximal to mid left circumflex artery, 20% ostial left circumflex artery disease, 40% OM 3 disease, widely patent stent in the ostial D1, 20% ostial to proximal LAD disease, 20% mid LAD disease.   Cataract    corrective surgery done   Fatty liver    Gallstones    GERD (gastroesophageal reflux disease)    Hepatitis    hepatitis A; past hx.,many yrs ago ? food source   Hernia, hiatal    Hyperlipidemia    Hypertension    Obesity    Sleep apnea    CPAP    Current Outpatient Medications  Medication Sig Dispense Refill   ACETAMINOPHEN PO Take 650 mg by mouth daily as needed for moderate pain or headache.     amiodarone (PACERONE) 200 MG tablet Take 1 tablet (200 mg total) by mouth 2 (two) times daily for 30 days, THEN 1 tablet (200 mg total) daily. 90 tablet 0   apixaban (ELIQUIS) 5 MG TABS tablet Take 1 tablet (5 mg total) by mouth 2 (two) times daily. 60 tablet 5   atropine 1 % ophthalmic solution Place 1 drop into the right eye daily.     beclomethasone (QVAR)  80 MCG/ACT inhaler Inhale 2 puffs into the lungs 2 (two) times daily. 1 each 11   cholecalciferol (VITAMIN D3) 25 MCG (1000 UNIT) tablet Take 1,000 Units by mouth 2 (two) times daily.     Coenzyme Q10 (COQ10) 100 MG CAPS Take 100 mg by mouth daily.     ezetimibe (ZETIA) 10 MG tablet TAKE 1 TABLET(10 MG) BY MOUTH DAILY 90 tablet 3   furosemide (LASIX) 40 MG tablet Take 1 tablet (40 mg total) by mouth daily. 90 tablet 3   metoprolol succinate (TOPROL-XL) 50 MG 24 hr tablet Take whole tablet in  the morning with or immediately following a meal and 1/2 tablet in the afternoon.     nitroGLYCERIN (NITROSTAT) 0.4 MG SL tablet Place 1 tablet (0.4 mg total) under the tongue every 5 (five) minutes as needed for chest pain. 25 tablet 3   omeprazole (PRILOSEC) 20 MG capsule Take 20 mg by mouth 2 (two) times daily before a meal.     Propylene Glycol (SYSTANE BALANCE) 0.6 % SOLN Place 1 drop into both eyes 2 (two) times daily as needed (dry eyes).     CRESTOR 20 MG tablet TAKE 1 TABLET(20 MG) BY MOUTH DAILY 90 tablet 3   levalbuterol (XOPENEX HFA) 45 MCG/ACT inhaler Inhale 1-2 puffs into the lungs every 6 (six) hours as needed for wheezing. 1 each 12   Tiotropium Bromide Monohydrate (SPIRIVA RESPIMAT) 1.25 MCG/ACT AERS Inhale 2 puffs into the lungs daily. 4 g 11   No current facility-administered medications for this encounter.    ROS- All systems are reviewed and negative except as per the HPI above.  Physical Exam: Vitals:   10/10/23 1453  BP: 118/64  Pulse: 97  SpO2: 93%  Weight: 122.5 kg  Height: 5\' 8"  (1.727 m)     GEN: Well nourished, well developed in no acute distress NECK: No JVD; No carotid bruits CARDIAC: Regular rate and rhythm, no murmurs, rubs, gallops RESPIRATORY:  Clear to auscultation without rales, wheezing or rhonchi  ABDOMEN: Soft, non-tender, non-distended EXTREMITIES:  No edema; No deformity    Wt Readings from Last 3 Encounters:  10/10/23 122.5 kg  09/26/23 122.5 kg  09/11/23 125 kg    EKG today demonstrates  SR, LAFB Vent. rate 67 BPM PR interval 200 ms QRS duration 92 ms QT/QTcB 452/477 ms   Echo 11/24/21 demonstrated   1. Left ventricular ejection fraction, by estimation, is 70 to 75%. The  left ventricle has hyperdynamic function. The left ventricle has no  regional wall motion abnormalities. There is moderate left ventricular  hypertrophy. Left ventricular diastolic parameters are indeterminate.   2. Resting peak LVOT gradient 84 mmHg   3.  Right ventricular systolic function is normal. The right ventricular  size is normal. Tricuspid regurgitation signal is inadequate for assessing  PA pressure.   4. Left atrial size was mildly dilated.   5. A small pericardial effusion is present.   6. The mitral valve is degenerative. Trivial mitral valve regurgitation.  Severe mitral stenosis. The mean mitral valve gradient is 16.0 mmHg with  average heart rate of 82 bpm. Severe mitral annular calcification.   7. The aortic valve has been repaired/replaced. Aortic valve  regurgitation is not visualized. There is a 23 mm Edwards Sapien  prosthetic (TAVR) valve present in the aortic position. Procedure Date:  05/14/22. Vmax 3.2 m/s, MG 20 mmHg, EOA 2.2 cm^2, DI 0.58     Epic records are reviewed at length today  CHA2DS2-VASc  Score = 5  The patient's score is based upon: CHF History: 0 HTN History: 1 Diabetes History: 0 Stroke History: 0 Vascular Disease History: 1 Age Score: 2 Gender Score: 1       ASSESSMENT AND PLAN: Persistent Atrial Fibrillation (ICD10:  I48.19) The patient's CHA2DS2-VASc score is 5, indicating a 7.2% annual risk of stroke.   Patient appears to be maintaining SR Continue amiodarone 200 mg BID for now. Decreased to once daily on 10/24/23. If her sleep does not improve, can decrease sooner. Continue Toprol 50 mg AM and 25 mg PM Continue Eliquis 5 mg BID  Secondary Hypercoagulable State (ICD10:  D68.69) The patient is at significant risk for stroke/thromboembolism based upon her CHA2DS2-VASc Score of 5.  Continue Apixaban (Eliquis).   Obesity Body mass index is 41.05 kg/m.  Encouraged lifestyle modification  OSA  Encouraged nightly CPAP  CAD No anginal symptoms  HCM Continue BB as above.  Valvular heart disease Severe AS s/p TAVR Moderate MS   Follow up in the AF clinic in 6 months.    Jorja Loa PA-C Afib Clinic [facility] [address] [place], [place]  [address] [phone] 10/10/2023 3:37 PM

## 2023-10-10 NOTE — Patient Instructions (Addendum)
Reduce amiodarone to once a day in 2 weeks

## 2023-10-14 ENCOUNTER — Telehealth: Payer: Self-pay | Admitting: *Deleted

## 2023-10-14 DIAGNOSIS — G4733 Obstructive sleep apnea (adult) (pediatric): Secondary | ICD-10-CM

## 2023-10-14 DIAGNOSIS — I10 Essential (primary) hypertension: Secondary | ICD-10-CM

## 2023-10-14 NOTE — Telephone Encounter (Signed)
Order placed to [facility] via fax for  All cpap supplies & mask of choice

## 2023-11-12 ENCOUNTER — Other Ambulatory Visit: Payer: Self-pay | Admitting: Physician Assistant

## 2023-11-12 ENCOUNTER — Ambulatory Visit: Payer: Medicare Other | Attending: Physician Assistant | Admitting: Physician Assistant

## 2023-11-12 ENCOUNTER — Encounter: Payer: Self-pay | Admitting: Physician Assistant

## 2023-11-12 VITALS — BP 120/58 | HR 67 | Ht 68.0 in | Wt 280.0 lb

## 2023-11-12 DIAGNOSIS — Z952 Presence of prosthetic heart valve: Secondary | ICD-10-CM

## 2023-11-12 DIAGNOSIS — I2510 Atherosclerotic heart disease of native coronary artery without angina pectoris: Secondary | ICD-10-CM

## 2023-11-12 DIAGNOSIS — I4819 Other persistent atrial fibrillation: Secondary | ICD-10-CM

## 2023-11-12 DIAGNOSIS — I10 Essential (primary) hypertension: Secondary | ICD-10-CM

## 2023-11-12 DIAGNOSIS — T462X4D Poisoning by other antidysrhythmic drugs, undetermined, subsequent encounter: Secondary | ICD-10-CM

## 2023-11-12 DIAGNOSIS — T462X4A Poisoning by other antidysrhythmic drugs, undetermined, initial encounter: Secondary | ICD-10-CM

## 2023-11-12 MED ORDER — METOPROLOL SUCCINATE ER 50 MG PO TB24: 50.0000 mg | ORAL_TABLET | Freq: Every day | ORAL | 3 refills | Status: DC

## 2023-11-12 NOTE — Patient Instructions (Signed)
Medication Instructions:  ON THE 25TH DECREASE AMIODARONE TO 100 MG DAILY ( CUT 200 MG TABLET IN 1/2)  DECREASE METOPROLOL SUCCINATE TO 50 MG DAILY  *If you need a refill on your cardiac medications before your next appointment, please call your pharmacy*   Lab Work: LFT AND TSH TODAY  PULMONARY FUNCTION TEST IN 2 MONTHS  If you have labs (blood work) drawn today and your tests are completely normal, you will receive your results only by: MyChart Message (if you have MyChart) OR A paper copy in the mail If you have any lab test that is abnormal or we need to change your treatment, we will call you to review the results.   Testing/Procedures: NO TESTING   Follow-Up: At [facility], you and your health needs are our priority.  As part of our continuing mission to provide you with exceptional heart care, we have created designated Provider Care Teams.  These Care Teams include your primary Cardiologist (physician) and Advanced Practice Providers (APPs -  Physician Assistants and Nurse Practitioners) who all work together to provide you with the care you need, when you need it.  Your next appointment:   4-5 month(s)  Provider:   Rollene Rotunda, MD

## 2023-11-12 NOTE — Progress Notes (Unsigned)
Cardiology Office Note:  .   Date:  11/12/2023  ID:  Victoria Delgado, DOB Aug 10, 1941, MRN [id number] PCP: Charlane Ferretti, DO  [facility] Providers Cardiologist:  Rollene Rotunda, MD { Click to update primary MD,subspecialty MD or APP then REFRESH:1}   History of Present Illness: .   Victoria Delgado is a 82 y.o. female with PMH of CAD, asymmetric septal hypertrophy, A-fib, history AS s/p TAVR, mitral stenosis, sleep apnea, hypertension and hyperlipidemia. Patient was admitted at [facility] in May 2017 with left arm pain. She had a positive troponin and underwent PCI of left circumflex and first diagonal. She was discharged on aspirin and Effient. Echocardiogram showed preserved EF but moderate aortic stenosis. She underwent cardiac catheterization again in April 2019 for chest pain, this showed patent stent in the proximal to mid left circumflex artery, 20% ostial left circumflex artery lesion, 40% OM 3 lesion, widely patent stent in ostial D1, 20% mid LAD lesion. Cardiac catheterization did confirm moderate aortic stenosis with mean gradient 26.7 mmHg. Medical therapy was recommended. She eventually underwent TAVR at [facility] as her aortic stenosis progressed to severe range. Patient was admitted in December 2023 with COVID and had atrial fibrillation with RVR. She was treated with IV amiodarone and sent home on oral amiodarone and anticoagulation therapy. Echocardiogram obtained during the admission showed normal TAVR gradient with moderate mitral stenosis.   Patient was readmitted in January 2024 due to acute respiratory failure in the setting of RSV and COPD exacerbation. Troponin was up to 1300. Initial plan was for left and right heart cath however patient complained of abdominal pain. Subsequent CT showed a large intramuscular hemorrhage in the right rectus sheath. Eliquis was discontinued and she was switched to aspirin. Echocardiogram obtained on 04/25/2023 showed stable TAVR valve,  hyperdynamic LV function, moderate mitral stenosis with mean gradient 9 mmHg.   I last saw the patient in October 2024.  She had presented to the emergency room twice in early September.  First time she was in A-fib however spontaneously converted to sinus rhythm.  She had recurrence of A-fib on 08/12/2023 and has been in persistent A-fib since.  I restarted her Eliquis.  I increased her metoprolol succinate to 50 mg a.m. and 25 mg p.m.  She underwent outpatient DCCV and was shocked 3 different times and converted to sinus rhythm however did not hold very long before quickly went back to a typical atrial flutter.  However by the time she followed up in October 2024, she has self converted back to sinus rhythm.  I discussed with the patient possibility of needing antiarrhythmic therapy.  Unfortunately she had recurrence of A-fib shortly after on 10/24 and was seen in the emergency room.  She self converted back to sinus rhythm and was discharged on oral amiodarone.  She was seen in A-fib clinic on 11/7, at which time she was holding sinus rhythm.  Patient presents today for follow-up.  She has been having systolic blood pressure in the 90s lately, I will reduce her metoprolol succinate from the previous 50 mg a.m. and 25 mg p.m. to 50 mg daily.  She is quite concerned about amiodarone toxicity given her poor pulmonary function.  She has completed 1 month of 200 mg twice daily dosing and the is currently on 200 mg daily since November 25.  I asked her to reduce amiodarone further down to 100 mg daily starting on 11/27/2023.  She will need a TSH and a liver function  test today given amiodarone therapy.  I also recommended a 67-month pulmonary function test.  She is currently scheduled to see pulmonology service in January, if her pulmonologist wished to cancel my order and reorder PFT in the pulmonology office, and I am fine with this.  On physical exam, she does not have any crackles to suggest pulmonary fibrosis.   Otherwise, she can follow-up with Dr. Antoine Poche in 4 to 5 months.  ROS: ***  Studies Reviewed: .        *** Risk Assessment/Calculations:   {Does this patient have ATRIAL FIBRILLATION?:[phone]}         Physical Exam:   VS:  BP (!) 120/58   Pulse 67   Ht 5\' 8"  (1.727 m)   Wt 280 lb (127 kg)   SpO2 93%   BMI 42.57 kg/m    Wt Readings from Last 3 Encounters:  11/12/23 280 lb (127 kg)  10/10/23 270 lb (122.5 kg)  09/26/23 270 lb (122.5 kg)    GEN: Well nourished, well developed in no acute distress NECK: No JVD; No carotid bruits CARDIAC: ***RRR, no murmurs, rubs, gallops RESPIRATORY:  Clear to auscultation without rales, wheezing or rhonchi  ABDOMEN: Soft, non-tender, non-distended EXTREMITIES:  No edema; No deformity   ASSESSMENT AND PLAN: .   ***    {Are you ordering a CV Procedure (e.g. stress test, cath, DCCV, TEE, etc)?   Press F2        :[id number]}  Dispo: ***  Signed, Azalee Course, PA

## 2023-11-13 ENCOUNTER — Telehealth: Payer: Self-pay | Admitting: Physician Assistant

## 2023-11-13 LAB — HEPATIC FUNCTION PANEL
ALT: 14 [IU]/L (ref 0–32)
AST: 19 [IU]/L (ref 0–40)
Albumin: 4 g/dL (ref 3.8–4.8)
Alkaline Phosphatase: 104 [IU]/L (ref 44–121)
Bilirubin Total: 0.6 mg/dL (ref 0.0–1.2)
Bilirubin, Direct: 0.16 mg/dL (ref 0.00–0.40)
Total Protein: 6.4 g/dL (ref 6.0–8.5)

## 2023-11-13 LAB — TSH: TSH: 3.11 u[IU]/mL (ref 0.450–4.500)

## 2023-11-13 NOTE — Telephone Encounter (Signed)
Please see documentation in 12/11 mychart message encounter

## 2023-11-13 NOTE — Telephone Encounter (Signed)
Pt c/o medication issue:  1. Name of Medication: metoprolol succinate (TOPROL-XL) 50 MG 24 hr tablet ; amiodarone (PACERONE) 200 MG tablet   2. How are you currently taking this medication (dosage and times per day)? Take 1 tablet (50 mg total) by mouth daily. Take with or immediately following a meal.    Take 1 tablet (200 mg total) by mouth 2 (two) times daily for 30 days, THEN 1 tablet (200 mg total) daily.  3. Are you having a reaction (difficulty breathing--STAT)? No   4. What is your medication issue? Patient called and said that she needs the name brand metoprolol succinate due to generic brand doesn't do anything for patient. Patient also says that pharmacy does not carry amiodarone

## 2023-11-14 MED ORDER — AMIODARONE HCL 200 MG PO TABS: ORAL_TABLET | ORAL | 2 refills | Status: DC

## 2023-11-14 NOTE — Addendum Note (Signed)
Addended by: Benay Spice on: 11/14/2023 12:56 PM   Modules accepted: Orders

## 2023-11-14 NOTE — Telephone Encounter (Signed)
Spoke with pt on the phone. Notified pt that prescription for amiodarone 200 was being sent to her preferred pharmacy and reminded her about the dosage change per last visit with PA [facility]. ON THE 25TH DECREASE AMIODARONE TO 100 MG DAILY ( CUT 200 MG TABLET IN 1/2). Patient verbalized understanding.

## 2023-12-02 ENCOUNTER — Other Ambulatory Visit: Payer: Self-pay | Admitting: Cardiology

## 2023-12-02 DIAGNOSIS — E785 Hyperlipidemia, unspecified: Secondary | ICD-10-CM

## 2023-12-24 ENCOUNTER — Ambulatory Visit (HOSPITAL_BASED_OUTPATIENT_CLINIC_OR_DEPARTMENT_OTHER): Payer: Medicare Other | Admitting: Pulmonary Disease

## 2023-12-24 ENCOUNTER — Other Ambulatory Visit (HOSPITAL_COMMUNITY): Payer: Self-pay

## 2023-12-24 ENCOUNTER — Encounter (HOSPITAL_BASED_OUTPATIENT_CLINIC_OR_DEPARTMENT_OTHER): Payer: Self-pay | Admitting: Pulmonary Disease

## 2023-12-24 VITALS — BP 150/74 | HR 90 | Ht 68.0 in | Wt 276.0 lb

## 2023-12-24 DIAGNOSIS — I4891 Unspecified atrial fibrillation: Secondary | ICD-10-CM | POA: Diagnosis not present

## 2023-12-24 DIAGNOSIS — R0602 Shortness of breath: Secondary | ICD-10-CM

## 2023-12-24 DIAGNOSIS — G4733 Obstructive sleep apnea (adult) (pediatric): Secondary | ICD-10-CM

## 2023-12-24 DIAGNOSIS — Z79899 Other long term (current) drug therapy: Secondary | ICD-10-CM

## 2023-12-24 MED ORDER — FLUTICASONE-SALMETEROL 100-50 MCG/ACT IN AEPB: 1.0000 | INHALATION_SPRAY | Freq: Two times a day (BID) | RESPIRATORY_TRACT | 5 refills | Status: DC

## 2023-12-24 NOTE — Progress Notes (Signed)
Subjective:   PATIENT ID: Victoria Delgado GENDER: female DOB: May 09, 1941, MRN: [id number]  Chief Complaint  Patient presents with   Shortness of Breath   Sleep Apnea    Reason for Visit: Follow-up, new patient to me  Ms. Victoria Delgado is an 83 year old female former smoker with OSA, CAD s/p stent, HOCM, HTN, pAF on AC, SVT, AS s/p TAVR, GERD, peripheral neuropathy who presents for follow-up.  She is on Qvar, Spiriva and PRN Xopenex. She reports worsening shortness of breath in the last 2-3 weeks. She is compliant with CPAP nightly but reports applewatch has notified her of oxygen levels to 83 and 84%. Worried about these recent changes. Denies cough or wheezing.   Social History: Former smoker. 20 pack-years. Quit >35 years ago  I have personally reviewed patient's past medical/family/social history, allergies, current medications.  Past Medical History:  Diagnosis Date   Abdominal pain    Arthritis    osteoarthritis-hips,knees, back, hands   Asymmetric septal hypertrophy    Atrial fibrillation (HCC)    CAD (coronary artery disease)    2 drug-eluting stents placed in the circumflex leading into a marginal.  May 2017.  [name].   catheterization on 03/19/2018, this showed patent stent in the proximal to mid left circumflex artery, 20% ostial left circumflex artery disease, 40% OM 3 disease, widely patent stent in the ostial D1, 20% ostial to proximal LAD disease, 20% mid LAD disease.   Cataract    corrective surgery done   Fatty liver    Gallstones    GERD (gastroesophageal reflux disease)    Hepatitis    hepatitis A; past hx.,many yrs ago ? food source   Hernia, hiatal    Hyperlipidemia    Hypertension    Obesity    Sleep apnea    CPAP     Family History  Problem Relation Age of Onset   Hypertension Mother    CVA Mother    Emphysema Mother        smoked   Lung cancer Father 33       asbestos exposure   Cancer - Lung Father        smoked and asbestos exp    Emphysema Sister        smoked   Esophageal cancer Other        nephew   Diabetes Other        nephew   Colon cancer Neg Hx    Pancreatic cancer Neg Hx    Kidney disease Neg Hx    Liver disease Neg Hx      Social History   Occupational History    Employer: OTHER  Tobacco Use   Smoking status: Former    Current packs/day: 0.00    Average packs/day: 1 pack/day for 20.0 years (20.0 ttl pk-yrs)    Types: Cigarettes    Start date: 12/03/1968    Quit date: 12/03/1988    Years since quitting: 35.0   Smokeless tobacco: Never  Vaping Use   Vaping status: Never Used  Substance and Sexual Activity   Alcohol use: No    Alcohol/week: 0.0 standard drinks of alcohol   Drug use: No   Sexual activity: Yes    Allergies  Allergen Reactions   Codeine Nausea Only   Kenalog [Triamcinolone Acetonide] Other (See Comments)    Had A.fib after she received the injection."Steroid meds"   Kenalog [Triamcinolone] Rash    Other reaction(s): AFIB  Pantoprazole Rash    Other reaction(s): chronic upper GI pain all the way to back   Relafen [Nabumetone] Other (See Comments)    Edema    Albuterol Other (See Comments)    Causes SVT   Epinephrine Palpitations   Penicillins Rash    40 years ago, injection site was red and swollen.  rash, facial/tongue/throat swelling, SOB or lightheadedness with hypotension: Yes Has patient had a PCN reaction causing immediate  Has patient had a PCN reaction causing severe rash involving mucus membranes or skin necrosis: NO Has patient had a PCN reaction that required hospitalization. No  Has patient had a PCN reaction occurring within the last 10 years: No If all of the above answers are "NO", then may proceed with Cephalosporin use.     Tramadol Other (See Comments)    Auditory halllucinations     Outpatient Medications Prior to Visit  Medication Sig Dispense Refill   ACETAMINOPHEN PO Take 650 mg by mouth daily as needed for moderate pain or headache.      amiodarone (PACERONE) 200 MG tablet ON THE 25TH DECREASE AMIODARONE TO 100 MG DAILY ( CUT 200 MG TABLET IN 1/2) 30 tablet 2   apixaban (ELIQUIS) 5 MG TABS tablet Take 1 tablet (5 mg total) by mouth 2 (two) times daily. 60 tablet 5   atropine 1 % ophthalmic solution Place 1 drop into the right eye daily.     beclomethasone (QVAR) 80 MCG/ACT inhaler Inhale 2 puffs into the lungs 2 (two) times daily. 1 each 11   cholecalciferol (VITAMIN D3) 25 MCG (1000 UNIT) tablet Take 1,000 Units by mouth 2 (two) times daily.     Coenzyme Q10 (COQ10) 100 MG CAPS Take 100 mg by mouth daily.     CRESTOR 20 MG tablet TAKE 1 TABLET(20 MG) BY MOUTH DAILY 90 tablet 3   CRESTOR 40 MG tablet TAKE 1 TABLET(40 MG) BY MOUTH DAILY 90 tablet 1   ezetimibe (ZETIA) 10 MG tablet TAKE 1 TABLET(10 MG) BY MOUTH DAILY 90 tablet 3   furosemide (LASIX) 40 MG tablet Take 1 tablet (40 mg total) by mouth daily. 90 tablet 3   levalbuterol (XOPENEX HFA) 45 MCG/ACT inhaler Inhale 1-2 puffs into the lungs every 6 (six) hours as needed for wheezing. 1 each 12   metoprolol succinate (TOPROL-XL) 50 MG 24 hr tablet Take 1 tablet (50 mg total) by mouth daily. Take with or immediately following a meal. 90 tablet 3   nitroGLYCERIN (NITROSTAT) 0.4 MG SL tablet Place 1 tablet (0.4 mg total) under the tongue every 5 (five) minutes as needed for chest pain. 25 tablet 3   omeprazole (PRILOSEC) 20 MG capsule Take 20 mg by mouth 2 (two) times daily before a meal.     Propylene Glycol (SYSTANE BALANCE) 0.6 % SOLN Place 1 drop into both eyes 2 (two) times daily as needed (dry eyes).     Tiotropium Bromide Monohydrate (SPIRIVA RESPIMAT) 1.25 MCG/ACT AERS Inhale 2 puffs into the lungs daily. 4 g 11   No facility-administered medications prior to visit.    Review of Systems  Constitutional:  Negative for chills, diaphoresis, fever, malaise/fatigue and weight loss.  HENT:  Negative for congestion.   Respiratory:  Positive for shortness of breath. Negative  for cough, hemoptysis, sputum production and wheezing.   Cardiovascular:  Negative for chest pain, palpitations and leg swelling.     Objective:   Vitals:   12/24/23 1327  BP: (!) 150/74  Pulse: 90  SpO2: 91%  Weight: 276 lb (125.2 kg)  Height: 5\' 8"  (1.727 m)   SpO2: 91 %  Physical Exam: General: Well-appearing, no acute distress HENT: [place], AT Eyes: EOMI, no scleral icterus Respiratory: Clear to auscultation bilaterally.  No crackles, wheezing or rales Cardiovascular: RRR, -M/R/G, no JVD Extremities:-Edema,-tenderness Neuro: AAO x4, CNII-XII grossly intact Psych: Normal mood, normal affect  Data Reviewed:  Imaging: CT Chest 06/19/22 - Mild centrilobular emphysema. Minimal bandlike scarring in bases, unchanged. No significant GGO, subpleural reticulation, traction bronchiectasis or honeycombing present. CXR 09/26/23 - Hyperinflation, chronic interstitial changes, calcified aorta, cardiomegaly  PFT: 04/27/22 FVC 2.18 (68%) FEV1 1.69 (70%) Ratio 74  TLC 93% DLCO 65% Interpretation: No obstructive defect. Reduced FVC and FEV1 but normal TLC. DLCO with mildly reduced gas exchange  Labs: CBC    Component Value Date/Time   WBC 8.9 09/26/2023 1325   RBC 5.26 (H) 09/26/2023 1325   HGB 14.7 09/26/2023 1325   HGB 14.7 07/07/2020 1112   HCT 44.9 09/26/2023 1325   HCT 44.4 07/07/2020 1112   PLT 309 09/26/2023 1325   PLT 234 07/07/2020 1112   MCV 85.4 09/26/2023 1325   MCV 89 07/07/2020 1112   MCH 27.9 09/26/2023 1325   MCHC 32.7 09/26/2023 1325   RDW 18.5 (H) 09/26/2023 1325   RDW 15.5 (H) 07/07/2020 1112   LYMPHSABS 2.4 08/06/2023 0537   MONOABS 0.8 08/06/2023 0537   EOSABS 0.1 08/06/2023 0537   BASOSABS 0.0 08/06/2023 0537    Absolute 08/06/23 - 100     Assessment & Plan:   Discussion: 83 year old female former smoker with OSA, CAD s/p stent, HOCM, HTN, pAF on AC, SVT, AS s/p TAVR, GERD, peripheral neuropathy who presents for follow-up. Reviewed history and  medications. Requested for Pulmonary to take over management of OSA  Shortness of breath - likely multifactorial with chronic cardiac, pulmonary and deconditioning contributing Emphysema +/- post-covid asthma Low-normal oxygen levels in clinic Ambulatory O2 in-clinic with nadir SpO2 90% --ORDER overnight oximetry on room air while on CPAP --STOP Qvar --START Wixela 100-50 mcg ONE puff in the morning and evening. Rinse mouth out after use --CONTINUE Spiriva TWO puffs ONCE a day --CONTINUE Xopenex AS NEEDED  Moderate OSA on CPAP --Diagnosed 2008 --Previously followed by Cardiologist, Dr. Tresa Endo --ORDER CPAP titration. May need increased pressures or supplemental oxygen  Atrial fibrillation Chronic amiodarone use, initiated 09/2023 --Schedule pulmonary function test in mid Feb  Health Maintenance Immunization History  Administered Date(s) Administered   Fluad Quad(high Dose 65+) 10/01/2019   Influenza, High Dose Seasonal PF 09/23/2017   Influenza,inj,Quad PF,6+ Mos 09/07/2013, 09/25/2016   Influenza-Unspecified 10/04/2014, 10/03/2021   PFIZER(Purple Top)SARS-COV-2 Vaccination 01/09/2020, 02/03/2020   RSV IGIV 12/05/2022   CT Lung Screen - not qualified due to age  Orders Placed This Encounter  Procedures   Pulmonary function test    Standing Status:   Future    Expiration Date:   12/23/2024    Where should this test be performed?:   [facility]    Full PFT: includes the following: basic spirometry, spirometry pre & post bronchodilator, diffusion capacity (DLCO), lung volumes:   Full PFT  No orders of the defined types were placed in this encounter.   Return for after PFT after mid Feb.  I have spent a total time of 45-minutes on the day of the appointment reviewing prior documentation, coordinating care and discussing medical diagnosis and plan with the patient/family. Imaging, labs and tests included in this note  have been reviewed and interpreted independently by  me.  Daemian Gahm Mechele Collin, MD [facility] 12/24/2023 1:35 PM

## 2023-12-24 NOTE — Patient Instructions (Signed)
Shortness of breath - likely multifactorial with chronic cardiac, pulmonary and deconditioning contributing Emphysema +/- post-covid asthma Low-normal oxygen levels in clinic Ambulatory O2 in-clinic with nadir SpO2 90% --ORDER overnight oximetry on room air while on CPAP --STOP Qvar --START Wixela 100-50 mcg ONE puff in the morning and evening. Rinse mouth out after use --CONTINUE Spiriva TWO puffs ONCE a day --CONTINUE Xopenex AS NEEDED  Moderate OSA on CPAP --Diagnosed 2008 --Previously followed by Cardiologist, Dr. Tresa Endo --ORDER CPAP titration. May need increased pressures or supplemental oxygen  Atrial fibrillation Chronic amiodarone use, initiated 09/2023 --Schedule pulmonary function test in mid Feb

## 2023-12-25 ENCOUNTER — Telehealth: Payer: Self-pay | Admitting: Student

## 2023-12-25 NOTE — Telephone Encounter (Signed)
[facility] states they spoke to a [place] from here yesterday. They are taking over care from [facility]. She called [facility] to get down load information but they said they could not connect to it from their system. [facility] said to get the data from [facility] or if PT needs to bring in CPAP for [place] to read. Her # is Asher Muir in Compliance @ [facility] [phone]

## 2023-12-26 ENCOUNTER — Encounter (HOSPITAL_BASED_OUTPATIENT_CLINIC_OR_DEPARTMENT_OTHER): Payer: Self-pay | Admitting: Pulmonary Disease

## 2023-12-26 DIAGNOSIS — J438 Other emphysema: Secondary | ICD-10-CM

## 2023-12-26 DIAGNOSIS — G4733 Obstructive sleep apnea (adult) (pediatric): Secondary | ICD-10-CM

## 2023-12-27 NOTE — Telephone Encounter (Signed)
Please obtain CPAP compliance report at the following number

## 2023-12-28 ENCOUNTER — Other Ambulatory Visit: Payer: Self-pay

## 2023-12-28 ENCOUNTER — Emergency Department (HOSPITAL_COMMUNITY): Payer: Medicare Other

## 2023-12-28 ENCOUNTER — Observation Stay (HOSPITAL_COMMUNITY)
Admission: EM | Admit: 2023-12-28 | Discharge: 2023-12-29 | Disposition: A | Discharge status: Home / Self Care | Payer: Medicare Other | Attending: Emergency Medicine | Admitting: Emergency Medicine

## 2023-12-28 DIAGNOSIS — Z7901 Long term (current) use of anticoagulants: Secondary | ICD-10-CM | POA: Diagnosis not present

## 2023-12-28 DIAGNOSIS — R5383 Other fatigue: Secondary | ICD-10-CM

## 2023-12-28 DIAGNOSIS — Z6841 Body Mass Index (BMI) 40.0 and over, adult: Secondary | ICD-10-CM | POA: Diagnosis not present

## 2023-12-28 DIAGNOSIS — E782 Mixed hyperlipidemia: Secondary | ICD-10-CM | POA: Diagnosis not present

## 2023-12-28 DIAGNOSIS — J449 Chronic obstructive pulmonary disease, unspecified: Secondary | ICD-10-CM | POA: Insufficient documentation

## 2023-12-28 DIAGNOSIS — E785 Hyperlipidemia, unspecified: Secondary | ICD-10-CM | POA: Diagnosis not present

## 2023-12-28 DIAGNOSIS — I4819 Other persistent atrial fibrillation: Secondary | ICD-10-CM | POA: Diagnosis present

## 2023-12-28 DIAGNOSIS — I421 Obstructive hypertrophic cardiomyopathy: Secondary | ICD-10-CM | POA: Diagnosis not present

## 2023-12-28 DIAGNOSIS — E66813 Obesity, class 3: Secondary | ICD-10-CM | POA: Insufficient documentation

## 2023-12-28 DIAGNOSIS — J9621 Acute and chronic respiratory failure with hypoxia: Secondary | ICD-10-CM | POA: Diagnosis not present

## 2023-12-28 DIAGNOSIS — Z96651 Presence of right artificial knee joint: Secondary | ICD-10-CM | POA: Diagnosis not present

## 2023-12-28 DIAGNOSIS — Z1152 Encounter for screening for COVID-19: Secondary | ICD-10-CM | POA: Insufficient documentation

## 2023-12-28 DIAGNOSIS — I2510 Atherosclerotic heart disease of native coronary artery without angina pectoris: Secondary | ICD-10-CM | POA: Diagnosis not present

## 2023-12-28 DIAGNOSIS — R0602 Shortness of breath: Principal | ICD-10-CM

## 2023-12-28 DIAGNOSIS — I480 Paroxysmal atrial fibrillation: Secondary | ICD-10-CM | POA: Diagnosis present

## 2023-12-28 DIAGNOSIS — R0902 Hypoxemia: Secondary | ICD-10-CM

## 2023-12-28 DIAGNOSIS — Z79899 Other long term (current) drug therapy: Secondary | ICD-10-CM | POA: Insufficient documentation

## 2023-12-28 DIAGNOSIS — G4730 Sleep apnea, unspecified: Secondary | ICD-10-CM | POA: Diagnosis present

## 2023-12-28 DIAGNOSIS — I10 Essential (primary) hypertension: Secondary | ICD-10-CM | POA: Insufficient documentation

## 2023-12-28 DIAGNOSIS — G4733 Obstructive sleep apnea (adult) (pediatric): Secondary | ICD-10-CM | POA: Diagnosis not present

## 2023-12-28 DIAGNOSIS — Z96643 Presence of artificial hip joint, bilateral: Secondary | ICD-10-CM | POA: Diagnosis not present

## 2023-12-28 DIAGNOSIS — K219 Gastro-esophageal reflux disease without esophagitis: Secondary | ICD-10-CM | POA: Insufficient documentation

## 2023-12-28 DIAGNOSIS — Z87891 Personal history of nicotine dependence: Secondary | ICD-10-CM | POA: Diagnosis not present

## 2023-12-28 LAB — I-STAT CG4 LACTIC ACID, ED: Lactic Acid, Venous: 1.9 mmol/L (ref 0.5–1.9)

## 2023-12-28 LAB — COMPREHENSIVE METABOLIC PANEL
ALT: 13 U/L (ref 0–44)
AST: 23 U/L (ref 15–41)
Albumin: 2.9 g/dL — ABNORMAL LOW (ref 3.5–5.0)
Alkaline Phosphatase: 71 U/L (ref 38–126)
Anion gap: 8 (ref 5–15)
BUN: 14 mg/dL (ref 8–23)
CO2: 25 mmol/L (ref 22–32)
Calcium: 8.3 mg/dL — ABNORMAL LOW (ref 8.9–10.3)
Chloride: 104 mmol/L (ref 98–111)
Creatinine, Ser: 0.87 mg/dL (ref 0.44–1.00)
GFR, Estimated: >60 mL/min (ref >60)
Glucose, Bld: 165 mg/dL — ABNORMAL HIGH (ref 70–99)
Potassium: 4.3 mmol/L (ref 3.5–5.1)
Sodium: 137 mmol/L (ref 135–145)
Total Bilirubin: 1.1 mg/dL (ref 0.0–1.2)
Total Protein: 6 g/dL — ABNORMAL LOW (ref 6.5–8.1)

## 2023-12-28 LAB — CBC WITH DIFFERENTIAL/PLATELET
Abs Immature Granulocytes: 0.1 10*3/uL — ABNORMAL HIGH (ref 0.00–0.07)
Basophils Absolute: 0 10*3/uL (ref 0.0–0.1)
Basophils Relative: 1 %
Eosinophils Absolute: 0.3 10*3/uL (ref 0.0–0.5)
Eosinophils Relative: 3 %
HCT: 36.6 % (ref 36.0–46.0)
Hemoglobin: 11.6 g/dL — ABNORMAL LOW (ref 12.0–15.0)
Immature Granulocytes: 1 %
Lymphocytes Relative: 10 %
Lymphs Abs: 0.8 10*3/uL (ref 0.7–4.0)
MCH: 28.7 pg (ref 26.0–34.0)
MCHC: 31.7 g/dL (ref 30.0–36.0)
MCV: 90.6 fL (ref 80.0–100.0)
Monocytes Absolute: 0.6 10*3/uL (ref 0.1–1.0)
Monocytes Relative: 8 %
Neutro Abs: 6.4 10*3/uL (ref 1.7–7.7)
Neutrophils Relative %: 77 %
Platelets: 297 10*3/uL (ref 150–400)
RBC: 4.04 MIL/uL (ref 3.87–5.11)
RDW: 19.2 % — ABNORMAL HIGH (ref 11.5–15.5)
WBC: 8.3 10*3/uL (ref 4.0–10.5)
nRBC: 0.6 % — ABNORMAL HIGH (ref 0.0–0.2)

## 2023-12-28 LAB — CBG MONITORING, ED: Glucose-Capillary: 206 mg/dL — ABNORMAL HIGH (ref 70–99)

## 2023-12-28 LAB — RESP PANEL BY RT-PCR (RSV, FLU A&B, COVID)  RVPGX2
Influenza A by PCR: NEGATIVE
Influenza B by PCR: NEGATIVE
Resp Syncytial Virus by PCR: NEGATIVE
SARS Coronavirus 2 by RT PCR: NEGATIVE

## 2023-12-28 LAB — TROPONIN I (HIGH SENSITIVITY)
Troponin I (High Sensitivity): 21 ng/L — ABNORMAL HIGH (ref <18)
Troponin I (High Sensitivity): 18 ng/L — ABNORMAL HIGH (ref <18)

## 2023-12-28 LAB — BRAIN NATRIURETIC PEPTIDE: B Natriuretic Peptide: 205.9 pg/mL — ABNORMAL HIGH (ref 0.0–100.0)

## 2023-12-28 LAB — LACTIC ACID, PLASMA: Lactic Acid, Venous: 1.8 mmol/L (ref 0.5–1.9)

## 2023-12-28 MED ORDER — APIXABAN 5 MG PO TABS
5.0000 mg | ORAL_TABLET | Freq: Two times a day (BID) | ORAL | Status: DC
  Administered 2023-12-28 – 2023-12-29 (×2): 5 mg via ORAL
  Filled 2023-12-28 (×2): qty 1

## 2023-12-28 MED ORDER — METOPROLOL SUCCINATE ER 50 MG PO TB24
50.0000 mg | ORAL_TABLET | Freq: Every day | ORAL | Status: DC
  Filled 2023-12-28: qty 1

## 2023-12-28 MED ORDER — EZETIMIBE 10 MG PO TABS
10.0000 mg | ORAL_TABLET | Freq: Every day | ORAL | Status: DC
  Administered 2023-12-29: 10 mg via ORAL
  Filled 2023-12-28: qty 1

## 2023-12-28 MED ORDER — POLYVINYL ALCOHOL 1.4 % OP SOLN: 1.0000 [drp] | Freq: Two times a day (BID) | OPHTHALMIC | Status: DC | PRN

## 2023-12-28 MED ORDER — ROSUVASTATIN CALCIUM 20 MG PO TABS
20.0000 mg | ORAL_TABLET | Freq: Every day | ORAL | Status: DC
  Administered 2023-12-29: 20 mg via ORAL
  Filled 2023-12-28: qty 1

## 2023-12-28 MED ORDER — ATROPINE SULFATE 1 % OP SOLN
1.0000 [drp] | Freq: Every day | OPHTHALMIC | Status: DC
  Administered 2023-12-29: 1 [drp] via OPHTHALMIC
  Filled 2023-12-28: qty 2

## 2023-12-28 MED ORDER — NITROGLYCERIN 0.4 MG SL SUBL: 0.4000 mg | SUBLINGUAL_TABLET | SUBLINGUAL | Status: DC | PRN

## 2023-12-28 MED ORDER — UMECLIDINIUM BROMIDE 62.5 MCG/ACT IN AEPB
1.0000 | INHALATION_SPRAY | Freq: Every day | RESPIRATORY_TRACT | Status: DC
  Filled 2023-12-28: qty 7

## 2023-12-28 MED ORDER — AMIODARONE HCL 200 MG PO TABS
100.0000 mg | ORAL_TABLET | Freq: Every day | ORAL | Status: DC
  Filled 2023-12-28 (×3): qty 1

## 2023-12-28 MED ORDER — COQ10 100 MG PO CAPS: 100.0000 mg | ORAL_CAPSULE | Freq: Every day | ORAL | Status: DC

## 2023-12-28 MED ORDER — PANTOPRAZOLE SODIUM 40 MG PO TBEC
40.0000 mg | DELAYED_RELEASE_TABLET | Freq: Every day | ORAL | Status: DC
  Administered 2023-12-29: 40 mg via ORAL
  Filled 2023-12-28: qty 1

## 2023-12-28 MED ORDER — FUROSEMIDE 40 MG PO TABS
40.0000 mg | ORAL_TABLET | Freq: Every day | ORAL | Status: DC
  Administered 2023-12-29: 40 mg via ORAL
  Filled 2023-12-28: qty 1

## 2023-12-28 MED ORDER — VITAMIN D 25 MCG (1000 UNIT) PO TABS
1000.0000 [IU] | ORAL_TABLET | Freq: Two times a day (BID) | ORAL | Status: DC
  Administered 2023-12-28 – 2023-12-29 (×2): 1000 [IU] via ORAL
  Filled 2023-12-28 (×2): qty 1

## 2023-12-28 MED ORDER — LEVALBUTEROL HCL 0.63 MG/3ML IN NEBU: 0.6300 mg | INHALATION_SOLUTION | Freq: Four times a day (QID) | RESPIRATORY_TRACT | Status: DC | PRN

## 2023-12-28 MED ORDER — FLUTICASONE FUROATE-VILANTEROL 100-25 MCG/ACT IN AEPB
1.0000 | INHALATION_SPRAY | Freq: Every day | RESPIRATORY_TRACT | Status: DC
  Administered 2023-12-29: 1 via RESPIRATORY_TRACT
  Filled 2023-12-28: qty 28

## 2023-12-28 NOTE — ED Triage Notes (Signed)
BIB [facility] from home for increased shortness of breath for about a week.. Patient reports to have weakness, unable to ambulate without being out of breath. Patient has history of COPD, CHF and Afib.

## 2023-12-28 NOTE — ED Notes (Signed)
2nd request to have secretary page admitting. Awaiting response.

## 2023-12-28 NOTE — ED Provider Notes (Addendum)
Victoria Delgado EMERGENCY DEPARTMENT AT [facility] Provider Note   CSN: [id number] Arrival date & time: 12/28/23  1204     History  Chief Complaint  Patient presents with   Shortness of Breath    Victoria Delgado is a 83 y.o. female.  The history is provided by the patient and medical records. No language interpreter was used.  Shortness of Breath Severity:  Severe Onset quality:  Gradual Duration:  3 days Timing:  Constant Progression:  Worsening Context: not URI   Relieved by:  Oxygen Worsened by:  Exertion Associated symptoms: cough (chronic and unchanged)   Associated symptoms: no abdominal pain, no chest pain, no fever, no headaches, no neck pain, no rash, no sputum production, no vomiting and no wheezing        Home Medications Prior to Admission medications   Medication Sig Start Date End Date Taking? Authorizing Provider  ACETAMINOPHEN PO Take 650 mg by mouth daily as needed for moderate pain or headache.    [provider]  amiodarone (PACERONE) 200 MG tablet ON THE 25TH DECREASE AMIODARONE TO 100 MG DAILY ( CUT 200 MG TABLET IN 1/2) 11/14/23   Azalee Course, PA  apixaban (ELIQUIS) 5 MG TABS tablet Take 1 tablet (5 mg total) by mouth 2 (two) times daily. 08/14/23   Azalee Course, PA  atropine 1 % ophthalmic solution Place 1 drop into the right eye daily.    [provider]  cholecalciferol (VITAMIN D3) 25 MCG (1000 UNIT) tablet Take 1,000 Units by mouth 2 (two) times daily.    [provider]  Coenzyme Q10 (COQ10) 100 MG CAPS Take 100 mg by mouth daily.    [provider]  CRESTOR 20 MG tablet TAKE 1 TABLET(20 MG) BY MOUTH DAILY 01/30/23   Rollene Rotunda, MD  CRESTOR 40 MG tablet TAKE 1 TABLET(40 MG) BY MOUTH DAILY 12/02/23   Rollene Rotunda, MD  ezetimibe (ZETIA) 10 MG tablet TAKE 1 TABLET(10 MG) BY MOUTH DAILY 08/20/23   Azalee Course, PA  fluticasone-salmeterol (WIXELA INHUB) 100-50 MCG/ACT AEPB Inhale 1 puff into the lungs 2 (two)  times daily. 12/24/23   Luciano Cutter, MD  furosemide (LASIX) 40 MG tablet Take 1 tablet (40 mg total) by mouth daily. 06/17/23   Rollene Rotunda, MD  levalbuterol [facility] HFA) 45 MCG/ACT inhaler Inhale 1-2 puffs into the lungs every 6 (six) hours as needed for wheezing. 06/26/22   Omar Person, MD  metoprolol succinate (TOPROL-XL) 50 MG 24 hr tablet Take 1 tablet (50 mg total) by mouth daily. Take with or immediately following a meal. 11/12/23 02/10/24  Azalee Course, PA  nitroGLYCERIN (NITROSTAT) 0.4 MG SL tablet Place 1 tablet (0.4 mg total) under the tongue every 5 (five) minutes as needed for chest pain. 05/07/16   Rosalio Macadamia, NP  omeprazole (PRILOSEC) 20 MG capsule Take 20 mg by mouth 2 (two) times daily before a meal.    [provider]  Propylene Glycol (SYSTANE BALANCE) 0.6 % SOLN Place 1 drop into both eyes 2 (two) times daily as needed (dry eyes).    [provider]  Tiotropium Bromide Monohydrate (SPIRIVA RESPIMAT) 1.25 MCG/ACT AERS Inhale 2 puffs into the lungs daily. 05/30/23   Omar Person, MD      Allergies    Codeine, Kenalog [triamcinolone acetonide], Kenalog [triamcinolone], Pantoprazole, Relafen [nabumetone], Albuterol, Epinephrine, Penicillins, and Tramadol    Review of Systems   Review of Systems  Constitutional:  Positive for  fatigue. Negative for chills and fever.  HENT:  Negative for congestion.   Respiratory:  Positive for cough (chronic and unchanged) and shortness of breath. Negative for sputum production, chest tightness, wheezing and stridor.   Cardiovascular:  Negative for chest pain, palpitations and leg swelling.  Gastrointestinal:  Negative for abdominal pain, constipation, diarrhea, nausea and vomiting.  Genitourinary:  Negative for dysuria and flank pain.  Musculoskeletal:  Negative for back pain, neck pain and neck stiffness.  Skin:  Negative for rash and wound.  Neurological:  Negative for light-headedness and headaches.   Psychiatric/Behavioral:  Negative for agitation and confusion.   All other systems reviewed and are negative.   Physical Exam Updated Vital Signs BP (!) 150/117   Pulse 72   Temp 98.4 F (36.9 C) (Oral)   Resp (!) 21   Ht 5\' 8"  (1.727 m)   Wt 124.7 kg   SpO2 93%   BMI 41.81 kg/m  Physical Exam Vitals and nursing note reviewed.  Constitutional:      General: She is not in acute distress.    Appearance: She is well-developed. She is not ill-appearing, toxic-appearing or diaphoretic.  HENT:     Head: Normocephalic and atraumatic.     Mouth/Throat:     Mouth: Mucous membranes are moist.  Eyes:     Extraocular Movements: Extraocular movements intact.     Conjunctiva/sclera: Conjunctivae normal.     Comments: Pupils are chronically asymmetric per patient report  Cardiovascular:     Rate and Rhythm: Normal rate and regular rhythm.     Heart sounds: No murmur heard. Pulmonary:     Effort: Pulmonary effort is normal. Tachypnea present. No respiratory distress.     Breath sounds: Rhonchi present. No wheezing.  Chest:     Chest wall: No tenderness.  Abdominal:     Palpations: Abdomen is soft.     Tenderness: There is no abdominal tenderness.  Musculoskeletal:        General: No swelling.     Cervical back: Neck supple.     Right lower leg: Edema present.     Left lower leg: Edema present.  Skin:    General: Skin is warm and dry.     Capillary Refill: Capillary refill takes less than 2 seconds.     Findings: No erythema.  Neurological:     Mental Status: She is alert.  Psychiatric:        Mood and Affect: Mood normal.     ED Results / Procedures / Treatments   Labs (all labs ordered are listed, but only abnormal results are displayed) Labs Reviewed  CBG MONITORING, ED - Abnormal; Notable for the following components:      Result Value   Glucose-Capillary 206 (*)    All other components within normal limits  RESP PANEL BY RT-PCR (RSV, FLU A&B, COVID)  RVPGX2  CBC  WITH DIFFERENTIAL/PLATELET  COMPREHENSIVE METABOLIC PANEL  LACTIC ACID, PLASMA  LACTIC ACID, PLASMA  BRAIN NATRIURETIC PEPTIDE  TROPONIN I (HIGH SENSITIVITY)    EKG EKG Interpretation Date/Time:  Saturday December 28 2023 12:06:55 EST Ventricular Rate:  83 PR Interval:  191 QRS Duration:  98 QT Interval:  419 QTC Calculation: 493 R Axis:   -88  Text Interpretation: Sinus rhythm Left anterior fascicular block Abnormal R-wave progression, late transition Probable left ventricular hypertrophy Borderline prolonged QT interval when compared to prior, overall similar appearance. No STEMI Confirmed by Theda Belfast ([id number]) on 12/28/2023 12:10:23 PM  Radiology DG  Chest Portable 1 View Result Date: 12/28/2023 CLINICAL DATA:  Shortness of breath.  Chronic cough. EXAM: PORTABLE CHEST 1 VIEW COMPARISON:  Chest radiograph dated September 26, 2023. FINDINGS: Stable cardiomegaly. Prior aortic valve replacement. Aortic atherosclerosis. Overlapping telemetry wires. Mild blunting of the right costophrenic angle. No evidence of overt pulmonary edema. No focal consolidation. No pneumothorax. No acute osseous abnormality. IMPRESSION: 1. Mild blunting of the right costophrenic angle may reflect a small right pleural effusion. 2. Stable cardiomegaly. Electronically Signed   By: Hart Robinsons M.D.   On: 12/28/2023 14:52    Procedures Procedures    Medications Ordered in ED Medications - No data to display  ED Course/ Medical Decision Making/ A&P                                 Medical Decision Making Amount and/or Complexity of Data Reviewed Labs: ordered. Radiology: ordered.    KASIAH MANKA is a 83 y.o. female with a past medical history significant for atrial fibrillation on Eliquis therapy, previous appendectomy, previous cholecystectomy, CAD, hypertension, hepatitis, previous TAVR, and hypertrophic obstructive cardiomyopathy who presents for worsening shortness of breath.  According to  patient, for the last 2 days she has had worsening exertional shortness of breath and has been getting hypoxic even in the daytime.  Recently, she was having intermittent hypoxic readings at home and she is not on home oxygen.  She saw her pulmonologist Avril days ago and the plan was to consider home oxygen in the future.  Patient said that over the last 2 days she is getting readings in the 80s at rest and she could not even walk across the room.  She is too winded and too tired and is feeling worse.  She reports has a chronic cough that does not seem to be different.  She is denying any actual chest pain with it.  Denies any new leg pain or leg swelling and denies any fevers, chills, nausea, vomiting, constipation, or diarrhea.  Denies urinary changes.  She is simply having shortness of breath and not able to breathe well.  On exam, she does have some coarseness but I do not appreciate significant wheezing.  Chest and abdomen nontender.  Good pulses in extremities.  Exam otherwise unremarkable.  She was feeling much better on 2 L nasal cannula and her oxygen saturations are now in the low 90s instead of 80s.  Pupils are chronically asymmetric per her report.  Patient's x-ray did not show focal pneumonia.  Due to her new oxygen requirement, she will need admission.  She will wait for screening labs and make sure she does not have COVID or flu RSV contributing to her worsened respiratory failure.  Given the hypoxia, she will need admission.  Care transferred oncoming team to wait for results of lab testing and workup prior to admission for hypoxic respiratory failure.             Final Clinical Impression(s) / ED Diagnoses Final diagnoses:  Exertional shortness of breath  Hypoxia  Fatigue, unspecified type    Clinical Impression: 1. Exertional shortness of breath   2. Hypoxia   3. Fatigue, unspecified type     Disposition: Admit  This note was prepared with assistance of Dragon voice  recognition software. Occasional wrong-word or sound-a-like substitutions may have occurred due to the inherent limitations of voice recognition software.      Erice Ahles, Canary Brim,  MD 12/28/23 1516    Jayon Matton, Canary Brim, MD 12/28/23 [phone]

## 2023-12-28 NOTE — ED Notes (Addendum)
Secretary to page admitting related to pt's hypotension.

## 2023-12-28 NOTE — H&P (Signed)
History and Physical    Patient: Victoria Delgado ZOX:[id number] DOB: 1941/10/11 DOA: 12/28/2023 DOS: the patient was seen and examined on 12/28/2023 PCP: Charlane Ferretti, DO  Patient coming from: Home  Chief Complaint:  Chief Complaint  Patient presents with   Shortness of Breath   HPI: SHARAE ZAPPULLA is a 83 y.o. female with medical history significant of obstructive sleep apnea, GERD, hyperlipidemia, obesity hypoventilation syndrome, essential hypertension, coronary artery disease, atrial fibrillation, osteoarthritis who was being set up for home O2 in the outpatient setting but has not been done yet presented to the ER with significant shortness of breath.  Patient apparently has been progressively getting sick with the shortness of breath.  Ultimately she came to the ER today where she was found to be hypoxic. Her oxygen sats were in the upper 80s but currently in the 90s on 2 L.  Appears patient will need need home O2.  She has been admitted for further evaluation.  Workup so far negative for anything acute.  Review of Systems: As mentioned in the history of present illness. All other systems reviewed and are negative. Past Medical History:  Diagnosis Date   Abdominal pain    Arthritis    osteoarthritis-hips,knees, back, hands   Asymmetric septal hypertrophy    Atrial fibrillation (HCC)    CAD (coronary artery disease)    2 drug-eluting stents placed in the circumflex leading into a marginal.  May 2017.  [name].   catheterization on 03/19/2018, this showed patent stent in the proximal to mid left circumflex artery, 20% ostial left circumflex artery disease, 40% OM 3 disease, widely patent stent in the ostial D1, 20% ostial to proximal LAD disease, 20% mid LAD disease.   Cataract    corrective surgery done   Fatty liver    Gallstones    GERD (gastroesophageal reflux disease)    Hepatitis    hepatitis A; past hx.,many yrs ago ? food source   Hernia, hiatal    Hyperlipidemia     Hypertension    Obesity    Sleep apnea    CPAP   Past Surgical History:  Procedure Laterality Date   APPENDECTOMY  2004   CARDIOVERSION N/A 01/03/2022   Procedure: CARDIOVERSION;  Surgeon: Sande Rives, MD;  Location[facility] ENDOSCOPY;  Service: Cardiovascular;  Laterality: N/A;   CARDIOVERSION N/A 09/10/2023   Procedure: CARDIOVERSION;  Surgeon: Jodelle Red, MD;  Location: [facility] INVASIVE CV LAB;  Service: Cardiovascular;  Laterality: N/A;   CATARACT EXTRACTION W/ INTRAOCULAR LENS IMPLANT     both eyes   CHOLECYSTECTOMY     LEFT HEART CATH AND CORONARY ANGIOGRAPHY N/A 03/19/2018   Procedure: LEFT HEART CATH AND CORONARY ANGIOGRAPHY;  Surgeon: Kathleene Hazel, MD;  Location: [facility] INVASIVE CV LAB;  Service: Cardiovascular;  Laterality: N/A;   NM MYOCAR PERF WALL MOTION  08/15/04   No ischemia   RETINAL DETACHMENT SURGERY Bilateral    remains with slight hazy vision with lights   RIGHT/LEFT HEART CATH AND CORONARY ANGIOGRAPHY N/A 07/11/2020   Procedure: RIGHT/LEFT HEART CATH AND CORONARY ANGIOGRAPHY;  Surgeon: Tonny Bollman, MD;  Location: [facility] INVASIVE CV LAB;  Service: Cardiovascular;  Laterality: N/A;   TOTAL HIP ARTHROPLASTY Right 08/04/2014   Procedure: RIGHT TOTAL HIP ARTHROPLASTY ANTERIOR APPROACH;  Surgeon: Loanne Drilling, MD;  Location: [facility] ORS;  Service: Orthopedics;  Laterality: Right;   TOTAL HIP ARTHROPLASTY Left 01/05/2015   Procedure: LEFT TOTAL HIP ARTHROPLASTY ANTERIOR APPROACH;  Surgeon: Ollen Gross  V, MD;  Location: [facility] ORS;  Service: Orthopedics;  Laterality: Left;   US ECHOCARDIOGRAPHY  06/25/2012   Moderate ASH,LV hyperdynamic,LA is mod. dilated,trace MR,TR,AI   Social History:  reports that she quit smoking about 35 years ago. Her smoking use included cigarettes. She started smoking about 55 years ago. She has a 20 pack-year smoking history. She has never used smokeless tobacco. She reports that she does not drink alcohol and does not use drugs.  Allergies   Allergen Reactions   Codeine Nausea Only   Kenalog [Triamcinolone Acetonide] Other (See Comments)    Had A.fib after she received the injection."Steroid meds"   Kenalog [Triamcinolone] Rash    Other reaction(s): AFIB   Pantoprazole Rash    Other reaction(s): chronic upper GI pain all the way to back   Relafen [Nabumetone] Other (See Comments)    Edema    Albuterol Other (See Comments)    Causes SVT   Epinephrine Palpitations   Penicillins Rash    40 years ago, injection site was red and swollen.  rash, facial/tongue/throat swelling, SOB or lightheadedness with hypotension: Yes Has patient had a PCN reaction causing immediate  Has patient had a PCN reaction causing severe rash involving mucus membranes or skin necrosis: NO Has patient had a PCN reaction that required hospitalization. No  Has patient had a PCN reaction occurring within the last 10 years: No If all of the above answers are "NO", then may proceed with Cephalosporin use.     Tramadol Other (See Comments)    Auditory halllucinations    Family History  Problem Relation Age of Onset   Hypertension Mother    CVA Mother    Emphysema Mother        smoked   Lung cancer Father 64       asbestos exposure   Cancer - Lung Father        smoked and asbestos exp   Emphysema Sister        smoked   Esophageal cancer Other        nephew   Diabetes Other        nephew   Colon cancer Neg Hx    Pancreatic cancer Neg Hx    Kidney disease Neg Hx    Liver disease Neg Hx     Prior to Admission medications   Medication Sig Start Date End Date Taking? Authorizing Provider  ACETAMINOPHEN PO Take 650 mg by mouth daily as needed for moderate pain or headache.    [provider]  amiodarone (PACERONE) 200 MG tablet ON THE 25TH DECREASE AMIODARONE TO 100 MG DAILY ( CUT 200 MG TABLET IN 1/2) 11/14/23   Azalee Course, PA  apixaban (ELIQUIS) 5 MG TABS tablet Take 1 tablet (5 mg total) by mouth 2 (two) times daily. 08/14/23    Azalee Course, PA  atropine 1 % ophthalmic solution Place 1 drop into the right eye daily.    [provider]  cholecalciferol (VITAMIN D3) 25 MCG (1000 UNIT) tablet Take 1,000 Units by mouth 2 (two) times daily.    [provider]  Coenzyme Q10 (COQ10) 100 MG CAPS Take 100 mg by mouth daily.    [provider]  CRESTOR 20 MG tablet TAKE 1 TABLET(20 MG) BY MOUTH DAILY 01/30/23   Rollene Rotunda, MD  CRESTOR 40 MG tablet TAKE 1 TABLET(40 MG) BY MOUTH DAILY 12/02/23   Rollene Rotunda, MD  ezetimibe (ZETIA) 10 MG tablet TAKE 1 TABLET(10 MG) BY  MOUTH DAILY 08/20/23   Azalee Course, PA  fluticasone-salmeterol [facility] INHUB) 100-50 MCG/ACT AEPB Inhale 1 puff into the lungs 2 (two) times daily. 12/24/23   Luciano Cutter, MD  furosemide (LASIX) 40 MG tablet Take 1 tablet (40 mg total) by mouth daily. 06/17/23   Rollene Rotunda, MD  levalbuterol [facility] HFA) 45 MCG/ACT inhaler Inhale 1-2 puffs into the lungs every 6 (six) hours as needed for wheezing. 06/26/22   Omar Person, MD  metoprolol succinate (TOPROL-XL) 50 MG 24 hr tablet Take 1 tablet (50 mg total) by mouth daily. Take with or immediately following a meal. 11/12/23 02/10/24  Azalee Course, PA  nitroGLYCERIN (NITROSTAT) 0.4 MG SL tablet Place 1 tablet (0.4 mg total) under the tongue every 5 (five) minutes as needed for chest pain. 05/07/16   Rosalio Macadamia, NP  omeprazole (PRILOSEC) 20 MG capsule Take 20 mg by mouth 2 (two) times daily before a meal.    [provider]  Propylene Glycol (SYSTANE BALANCE) 0.6 % SOLN Place 1 drop into both eyes 2 (two) times daily as needed (dry eyes).    [provider]  Tiotropium Bromide Monohydrate (SPIRIVA RESPIMAT) 1.25 MCG/ACT AERS Inhale 2 puffs into the lungs daily. 05/30/23   Omar Person, MD    Physical Exam: Vitals:   12/28/23 1500 12/28/23 1730 12/28/23 1800 12/28/23 1852  BP: (!) 105/90 (!) 117/52 (!) 112/50 (!) 103/45  Pulse: 63 80 66 73  Resp: (!) 23 (!)  23 20 20   Temp:    98.2 F (36.8 C)  TempSrc:      SpO2: 96% 95% 99% 98%  Weight:      Height:       Constitutional: Morbidly obese, NAD, calm, comfortable Eyes: PERRL, lids and conjunctivae normal ENMT: Mucous membranes are moist. Posterior pharynx clear of any exudate or lesions.Normal dentition.  Neck: normal, supple, no masses, no thyromegaly Respiratory: clear to auscultation bilaterally, no wheezing, no crackles. Normal respiratory effort. No accessory muscle use.  Cardiovascular: Regular rate and rhythm, no murmurs / rubs / gallops. No extremity edema. 2+ pedal pulses. No carotid bruits.  Abdomen: no tenderness, no masses palpated. No hepatosplenomegaly. Bowel sounds positive.  Musculoskeletal: Good range of motion, no joint swelling or tenderness, Skin: no rashes, lesions, ulcers. No induration Neurologic: CN 2-12 grossly intact. Sensation intact, DTR normal. Strength 5/5 in all 4.  Psychiatric: Normal judgment and insight. Alert and oriented x 3. Normal mood  Data Reviewed:  Blood pressure 150/117, pulse 80 respirate 23, hemoglobin 11.6 calcium 8.3.  Troponin of 18 and 21 acute viral screen is negative, BNP 5205, chest x-ray showed mild blunting of the right costophrenic angle which might apply a small right pleural effusion  Assessment and Plan:  #1 chronic hypoxic respiratory failure: Probably multifactorial.  Patient has COPD, pulmonary hypertension and obstructive sleep apnea, obesity hypoventilation, syndrome.  Patient will need oxygen at home.  Will admit the patient for observation.  Will get patient tested for home O2.  #2 hypertrophic obstructive cardiomyopathy: Chronic.  Continue treatment.  #3 essential hypertension: Continue blood pressure medication.  Continue to monitor.  #4 obstructive sleep apnea: Continue with CPAP  #5 GERD: Continue with PPIs.  #6 mixed hyperlipidemia: Continue statin  #7 morbid obesity: Continues dietary counseling  #8 paroxysmal  atrial fibrillation: Continue chronic anticoagulation.  Continue amiodarone.   Advance Care Planning:   Code Status: Full Code   Consults: None  Family Communication: No family at bedside  Severity  of Illness: The appropriate patient status for this patient is OBSERVATION. Observation status is judged to be reasonable and necessary in order to provide the required intensity of service to ensure the patient's safety. The patient's presenting symptoms, physical exam findings, and initial radiographic and laboratory data in the context of their medical condition is felt to place them at decreased risk for further clinical deterioration. Furthermore, it is anticipated that the patient will be medically stable for discharge from the hospital within 2 midnights of admission.   AuthorLonia Blood, MD 12/28/2023 7:03 PM  For on call review [email or web address].

## 2023-12-28 NOTE — ED Notes (Addendum)
Dr. Mikeal Hawthorne notified of pt's BP at this time. No new orders received.

## 2023-12-28 NOTE — ED Provider Notes (Signed)
Pt was signed out by Dr. Rush Landmark pending labs.  CBC nl, CMP nl, lactic 1.9 trop sl elevated at 21, bnp sl elevated at 205.9  Covid/flu/rsv neg  Lactic nl   CXR reviewed by me.  I agree with the radiologist.  . Mild blunting of the right costophrenic angle may reflect a small  right pleural effusion.  2. Stable cardiomegaly.    Pt is feeling much better on 2L oxygen via [place].  Pt d/w Dr. Mikeal Hawthorne (triad) for obs admission.    Jacalyn Lefevre, MD 12/28/23 [phone]

## 2023-12-29 ENCOUNTER — Encounter (HOSPITAL_COMMUNITY): Payer: Self-pay | Admitting: Internal Medicine

## 2023-12-29 DIAGNOSIS — I480 Paroxysmal atrial fibrillation: Secondary | ICD-10-CM

## 2023-12-29 DIAGNOSIS — I421 Obstructive hypertrophic cardiomyopathy: Secondary | ICD-10-CM

## 2023-12-29 DIAGNOSIS — I4819 Other persistent atrial fibrillation: Secondary | ICD-10-CM

## 2023-12-29 DIAGNOSIS — I2510 Atherosclerotic heart disease of native coronary artery without angina pectoris: Secondary | ICD-10-CM

## 2023-12-29 DIAGNOSIS — J9621 Acute and chronic respiratory failure with hypoxia: Secondary | ICD-10-CM | POA: Diagnosis not present

## 2023-12-29 LAB — LACTIC ACID, PLASMA: Lactic Acid, Venous: 1.1 mmol/L (ref 0.5–1.9)

## 2023-12-29 MED ORDER — AMIODARONE HCL 100 MG PO TABS: 100.0000 mg | ORAL_TABLET | Freq: Every day | ORAL | 0 refills | Status: DC

## 2023-12-29 NOTE — Discharge Summary (Signed)
Physician Discharge Summary  Victoria Delgado AOZ:[id number] DOB: 29-Nov-1941 DOA: 12/28/2023  PCP: Charlane Ferretti, DO  Admit date: 12/28/2023 Discharge date: 12/29/2023 Admitted From: Home Disposition: Home Recommendations for Outpatient Follow-up:  Follow up with PCP in 1 week Outpatient follow-up with pulmonology in 1 to 2 weeks Check CMP and CBC at follow-up Please follow up on the following pending results: None  Home Health: [facility] PT/OT.  I have also ordered ambulatory referral to pulmonary rehab if possible. Equipment/Devices: Oxygen, 4 L by [place]  Discharge Condition: Stable CODE STATUS: Full code  Follow-up Information     Charlane Ferretti, DO. Schedule an appointment as soon as possible for a visit in 1 week(s).   Specialty: Internal Medicine Contact information: [address] [place] [address] [phone]                 Hospital course 83 y.o. F with PMH of OSA/OHS on CPAP (not functioning well), COPD, A-fib on Eliquis,GERD, HTN, CAD and morbid obesity presenting with significant shortness of breath and hypoxemia.  Patient reports problem with her home CPAP.  She has been working with pulmonology and DME agencies to have this taken care of.  She says her oxygen drops to 80s when she sleeps at night.  She denies chest pain, cough, fever, chills, orthopnea, PND or edema.  She was seen by her pulmonologist on 1/21 who ordered overnight oximetry on room air while on CPAP.  Qvar was stopped and she was started on Wixela.  She is also on Spiriva and Xopenex.  Pulmonology also ordered CPAP titration.  She had a scheduled for PFT in mid February.  Patient remained stable overnight.  Ambulated on room air and desaturated to 87%.  He required 4 L to recover to 93% with exertion.  She is discharged on 4 L by nasal cannula.  Home health PT/OT ordered as recommended by therapy.  Patient has had soft blood pressures/mild hypotension throughout stay.  Discontinued Toprol-XL given  history of HOCM to avoid hypotension.  She is on amiodarone 100 mg daily for A-fib.  Initiate new prescription for amiodarone.  Recommended outpatient follow-up with PCP and pulmonology  See individual problem list below for more.   Problems addressed during this hospitalization Principal Problem:   Acute on chronic respiratory failure with hypoxemia (HCC) Active Problems:   HOCM (hypertrophic obstructive cardiomyopathy) (HCC)   HTN (hypertension)   Sleep apnea   GERD (gastroesophageal reflux disease)   Mixed hyperlipidemia   Obesity, Class III, BMI 40-49.9 (morbid obesity) (HCC)   PAF (paroxysmal atrial fibrillation) (HCC)   Coronary artery disease involving native coronary artery of native heart without angina pectoris   Dyslipidemia   Persistent atrial fibrillation (HCC)              Time spent 35 minutes  Vital signs Vitals:   12/28/23 2110 12/29/23 0606 12/29/23 0852 12/29/23 1229  BP: (!) 134/59 (!) 107/53 (!) 103/48 (!) 95/58  Pulse: 74 74 73 77  Temp: 98.2 F (36.8 C) 98.2 F (36.8 C) 98.2 F (36.8 C)   Resp: 16 16 17 16   Height:      Weight:      SpO2: 99% 97% 95% 97%  TempSrc: Oral Oral Oral   BMI (Calculated):         Discharge exam  GENERAL: No apparent distress.  Nontoxic. HEENT: MMM.  Vision and hearing grossly intact.  NECK: Supple.  No apparent JVD.  RESP:  No IWOB.  Fair aeration bilaterally. CVS:  RRR. Heart sounds normal.  ABD/GI/GU: BS+. Abd soft, NTND.  MSK/EXT:  Moves extremities. No apparent deformity.  Trace BLE edema. SKIN: no apparent skin lesion or wound NEURO: Awake and alert. Oriented appropriately.  No apparent focal neuro deficit. PSYCH: Calm. Normal affect.   Discharge Instructions Discharge Instructions     AMB referral to pulmonary rehabilitation   Complete by: As directed    Please select a program: Pulmonary Rehabilitation   Diagnosis: (See process instructions below for COPD requirements): COPD-Gold 3: Severe   30%  </= FEV1 <50% predicted   After initial evaluation and assessments completed: Virtual Based Care may be provided alone or in conjunction with Pulmonary Rehab/Respiratory Care services based on patient barriers.: Yes   Diet - low sodium heart healthy   Complete by: As directed    Discharge instructions   Complete by: As directed    It has been a pleasure taking care of you!  You were hospitalized due to increased shortness of breath likely from underlying COPD, obstructive sleep apnea and obesity.  We are discharging you with oxygen.  We also recommend you talk to your pulmonologist or DME suppliers about your CPAP.  Use your medications as prescribed.   Take care,   Increase activity slowly   Complete by: As directed       Allergies as of 12/29/2023       Reactions   Codeine Nausea Only   Kenalog [triamcinolone Acetonide] Other (See Comments)   Had A.fib after she received the injection."Steroid meds"   Kenalog [triamcinolone] Rash   Other reaction(s): AFIB   Pantoprazole Rash   Other reaction(s): chronic upper GI pain all the way to back   Relafen [nabumetone] Other (See Comments)   Edema   Albuterol Other (See Comments)   Causes SVT   Epinephrine Palpitations   Penicillins Rash   40 years ago, injection site was red and swollen.  rash, facial/tongue/throat swelling, SOB or lightheadedness with hypotension: Yes Has patient had a PCN reaction causing immediate  Has patient had a PCN reaction causing severe rash involving mucus membranes or skin necrosis: NO Has patient had a PCN reaction that required hospitalization. No  Has patient had a PCN reaction occurring within the last 10 years: No If all of the above answers are "NO", then may proceed with Cephalosporin use.    Tramadol Other (See Comments)   Auditory halllucinations        Medication List     STOP taking these medications    metoprolol succinate 50 MG 24 hr tablet Commonly known as: TOPROL-XL        TAKE these medications    ACETAMINOPHEN PO Take 650 mg by mouth daily as needed for moderate pain or headache.   amiodarone 100 MG tablet Commonly known as: Pacerone Take 1 tablet (100 mg total) by mouth daily. Start taking on: December 30, 2023 What changed:  medication strength how much to take how to take this when to take this additional instructions   apixaban 5 MG Tabs tablet Commonly known as: Eliquis Take 1 tablet (5 mg total) by mouth 2 (two) times daily.   atropine 1 % ophthalmic solution Place 1 drop into the right eye daily.   cholecalciferol 25 MCG (1000 UNIT) tablet Commonly known as: VITAMIN D3 Take 1,000 Units by mouth 2 (two) times daily.   CoQ10 100 MG Caps Take 100 mg by mouth daily.   Crestor 20 MG tablet Generic  drug: rosuvastatin TAKE 1 TABLET(20 MG) BY MOUTH DAILY   ezetimibe 10 MG tablet Commonly known as: ZETIA TAKE 1 TABLET(10 MG) BY MOUTH DAILY   fluticasone-salmeterol 100-50 MCG/ACT Aepb Commonly known as: Wixela Inhub Inhale 1 puff into the lungs 2 (two) times daily.   furosemide 40 MG tablet Commonly known as: LASIX Take 1 tablet (40 mg total) by mouth daily.   levalbuterol 45 MCG/ACT inhaler Commonly known as: Xopenex HFA Inhale 1-2 puffs into the lungs every 6 (six) hours as needed for wheezing.   nitroGLYCERIN 0.4 MG SL tablet Commonly known as: NITROSTAT Place 1 tablet (0.4 mg total) under the tongue every 5 (five) minutes as needed for chest pain.   omeprazole 20 MG capsule Commonly known as: PRILOSEC Take 20 mg by mouth 2 (two) times daily before a meal.   Spiriva Respimat 1.25 MCG/ACT Aers Generic drug: Tiotropium Bromide Monohydrate Inhale 2 puffs into the lungs daily.   Systane Balance 0.6 % Soln Generic drug: Propylene Glycol Place 1 drop into both eyes 2 (two) times daily as needed (dry eyes).               Durable Medical Equipment  (From admission, onward)           Start     Ordered    12/29/23 1223  For home use only DME oxygen  Once       Question Answer Comment  Length of Need 6 Months   Mode or (Route) Nasal cannula   Liters per Minute 4   Frequency Continuous (stationary and portable oxygen unit needed)   Oxygen delivery system Gas      12/29/23 1222            Consultations: None  Procedures/Studies:   DG Chest Portable 1 View Result Date: 12/28/2023 CLINICAL DATA:  Shortness of breath.  Chronic cough. EXAM: PORTABLE CHEST 1 VIEW COMPARISON:  Chest radiograph dated September 26, 2023. FINDINGS: Stable cardiomegaly. Prior aortic valve replacement. Aortic atherosclerosis. Overlapping telemetry wires. Mild blunting of the right costophrenic angle. No evidence of overt pulmonary edema. No focal consolidation. No pneumothorax. No acute osseous abnormality. IMPRESSION: 1. Mild blunting of the right costophrenic angle may reflect a small right pleural effusion. 2. Stable cardiomegaly. Electronically Signed   By: Hart Robinsons M.D.   On: 12/28/2023 14:52       The results of significant diagnostics from this hospitalization (including imaging, microbiology, ancillary and laboratory) are listed below for reference.     Microbiology: Recent Results (from the past 240 hours)  Resp panel by RT-PCR (RSV, Flu A&B, Covid) Anterior Nasal Swab     Status: None   Collection Time: 12/28/23  2:04 PM   Specimen: Anterior Nasal Swab  Result Value Ref Range Status   SARS Coronavirus 2 by RT PCR NEGATIVE NEGATIVE Final   Influenza A by PCR NEGATIVE NEGATIVE Final   Influenza B by PCR NEGATIVE NEGATIVE Final    Comment: (NOTE) The Xpert Xpress SARS-CoV-2/FLU/RSV plus assay is intended as an aid in the diagnosis of influenza from Nasopharyngeal swab specimens and should not be used as a sole basis for treatment. Nasal washings and aspirates are unacceptable for Xpert Xpress SARS-CoV-2/FLU/RSV testing.  Fact Sheet for  Patients: BloggerCourse.com  Fact Sheet for Healthcare Providers: SeriousBroker.it  This test is not yet approved or cleared by the [place] FDA and has been authorized for detection and/or diagnosis of SARS-CoV-2 by FDA under an Emergency Use Authorization (EUA). This  EUA will remain in effect (meaning this test can be used) for the duration of the COVID-19 declaration under Section 564(b)(1) of the Act, 21 U.S.C. section 360bbb-3(b)(1), unless the authorization is terminated or revoked.     Resp Syncytial Virus by PCR NEGATIVE NEGATIVE Final    Comment: (NOTE) Fact Sheet for Patients: BloggerCourse.com  Fact Sheet for Healthcare Providers: SeriousBroker.it  This test is not yet approved or cleared by the [place] FDA and has been authorized for detection and/or diagnosis of SARS-CoV-2 by FDA under an Emergency Use Authorization (EUA). This EUA will remain in effect (meaning this test can be used) for the duration of the COVID-19 declaration under Section 564(b)(1) of the Act, 21 U.S.C. section 360bbb-3(b)(1), unless the authorization is terminated or revoked.  Performed at [facility], [address], [place], [place] [address]      Labs:  CBC: Recent Labs  Lab 12/28/23 1536  WBC 8.3  NEUTROABS 6.4  HGB 11.6*  HCT 36.6  MCV 90.6  PLT 297   BMP &GFR Recent Labs  Lab 12/28/23 1536  NA 137  K 4.3  CL 104  CO2 25  GLUCOSE 165*  BUN 14  CREATININE 0.87  CALCIUM 8.3*   Estimated Creatinine Clearance: 71.8 mL/min (by C-G formula based on SCr of 0.87 mg/dL). Liver & Pancreas: Recent Labs  Lab 12/28/23 1536  AST 23  ALT 13  ALKPHOS 71  BILITOT 1.1  PROT 6.0*  ALBUMIN 2.9*   No results for input(s): "LIPASE", "AMYLASE" in the last 168 hours. No results for input(s): "AMMONIA" in the last 168 hours. Diabetic: No results for input(s):  "HGBA1C" in the last 72 hours. Recent Labs  Lab 12/28/23 1209  GLUCAP 206*   Cardiac Enzymes: No results for input(s): "CKTOTAL", "CKMB", "CKMBINDEX", "TROPONINI" in the last 168 hours. Recent Labs    01/15/23 1343  PROBNP 302.0*   Coagulation Profile: No results for input(s): "INR", "PROTIME" in the last 168 hours. Thyroid Function Tests: No results for input(s): "TSH", "T4TOTAL", "FREET4", "T3FREE", "THYROIDAB" in the last 72 hours. Lipid Profile: No results for input(s): "CHOL", "HDL", "LDLCALC", "TRIG", "CHOLHDL", "LDLDIRECT" in the last 72 hours. Anemia Panel: No results for input(s): "VITAMINB12", "FOLATE", "FERRITIN", "TIBC", "IRON", "RETICCTPCT" in the last 72 hours. Urine analysis:    Component Value Date/Time   COLORURINE YELLOW 12/11/2022 1950   APPEARANCEUR HAZY (A) 12/11/2022 1950   LABSPEC 1.018 12/11/2022 1950   PHURINE 5.0 12/11/2022 1950   GLUCOSEU NEGATIVE 12/11/2022 1950   HGBUR NEGATIVE 12/11/2022 1950   BILIRUBINUR NEGATIVE 12/11/2022 1950   BILIRUBINUR neg 01/25/2015 0920   KETONESUR NEGATIVE 12/11/2022 1950   PROTEINUR NEGATIVE 12/11/2022 1950   UROBILINOGEN negative 01/25/2015 0920   UROBILINOGEN 1.0 12/27/2014 0850   NITRITE NEGATIVE 12/11/2022 1950   LEUKOCYTESUR TRACE (A) 12/11/2022 1950   Sepsis Labs: Invalid input(s): "PROCALCITONIN", "LACTICIDVEN"   SIGNED:  Almon Hercules, MD  [facility] 12/29/2023, 1:42 PM

## 2023-12-29 NOTE — Evaluation (Signed)
Occupational Therapy Evaluation Patient Details Name: Victoria Delgado MRN: [id number] DOB: [birth year] Today's Date: 12/29/2023   History of Present Illness Victoria Delgado is a 83 y.o. female who was being set up for home O2 in the outpatient setting but has not been done yet, presented to the ER with significant shortness of breath.  Patient apparently has been progressively getting sick with the shortness of breath.  Ultimately she came to the ER where she was found to be hypoxic. Her oxygen sats were in the upper 80s but currently in the 90s on 2 L.  Appears patient will need need home O2.  She has been admitted for further evaluation.   Clinical Impression   PTA, pt reports she was independent with ADL/IADL and functional mobility. Pt currently requires contact guard assistance for functional mobility with  person hand held assistance. Reports this 1 person hand held support needed since onset of symptoms. Pt requires cga for toileting transfers. Pt on 2lnc upon arrival, SpO2 98%. RA at rest SpO2 93%, with exertion SpO2 87% RA, required seated rest break with pursed lip breathing to rebound O2 to 90%. PT arrived at end of OT session to complete walking test with pt. At this time, recommend pulmonary rehab. Will continue to follow pt acutely and progress appropriately.       If plan is discharge home, recommend the following: A little help with walking and/or transfers    Functional Status Assessment  Patient has had a recent decline in their functional status and demonstrates the ability to make significant improvements in function in a reasonable and predictable amount of time.  Equipment Recommendations  None recommended by OT    Recommendations for Other Services       Precautions / Restrictions Precautions Precautions: Fall Restrictions Weight Bearing Restrictions Per Provider Order: No      Mobility Bed Mobility               General bed mobility comments: sitting  in recliner upon arrival and left with PT    Transfers Overall transfer level: Needs assistance Equipment used: 1 person hand held assist Transfers: Sit to/from Stand Sit to Stand: Contact guard assist           General transfer comment: contact guard assistance for safety with mobility, pt with preference for at least single UE support for stability, reports noting preference for ue support since breathing symptomts starting      Balance Overall balance assessment: Mild deficits observed, not formally tested                                         ADL either performed or assessed with clinical judgement   ADL Overall ADL's : Needs assistance/impaired                                     Functional mobility during ADLs: Contact guard assist General ADL Comments: contact guard assistance for functional mobility, pt with preference for single UE support.  contact guard assist to supervision for ADL;education on energy conservation strategies during adl completion     Vision         Perception         Praxis         Pertinent Vitals/Pain Pain Assessment Pain  Assessment: No/denies pain     Extremity/Trunk Assessment Upper Extremity Assessment Upper Extremity Assessment: Overall WFL for tasks assessed   Lower Extremity Assessment Lower Extremity Assessment: Overall WFL for tasks assessed   Cervical / Trunk Assessment Cervical / Trunk Assessment: Normal   Communication Communication Communication: No apparent difficulties   Cognition Arousal: Alert Behavior During Therapy: WFL for tasks assessed/performed Overall Cognitive Status: Within Functional Limits for tasks assessed                                       General Comments       Exercises     Shoulder Instructions      Home Living Family/patient expects to be discharged to:: Private residence Living Arrangements: Spouse/significant  other Available Help at Discharge: Family Type of Home: House Home Access: Stairs to enter [employer] of Steps: 3/4 Entrance Stairs-Rails: Right Home Layout: One level     Bathroom Shower/Tub: Producer, television/film/video: Handicapped height     Home Equipment: Agricultural consultant (2 wheels);Cane - single point;Grab bars - tub/shower;Electric scooter          Prior Functioning/Environment Prior Level of Function : Independent/Modified Independent               ADLs Comments: IADL and driving        OT Problem List: Decreased activity tolerance;Cardiopulmonary status limiting activity      OT Treatment/Interventions: Self-care/ADL training;Energy conservation;DME and/or AE instruction;Patient/family education    OT Goals(Current goals can be found in the care plan section) Acute Rehab OT Goals Patient Stated Goal: to get supplemental oxygen to breathe better OT Goal Formulation: With patient Time For Goal Achievement: 01/12/24 Potential to Achieve Goals: Good ADL Goals Pt Will Transfer to Toilet: with modified independence;ambulating Additional ADL Goal #1: Pt will demonstrate independence wtih 3 energy conservation strategies. Additional ADL Goal #2: Pt will tolerate ADL for 10-76min with stable O2 sats.  OT Frequency: Min 2X/week    Co-evaluation              AM-PAC OT "6 Clicks" Daily Activity     Outcome Measure Help from another person eating meals?: None Help from another person taking care of personal grooming?: A Little Help from another person toileting, which includes using toliet, bedpan, or urinal?: A Little Help from another person bathing (including washing, rinsing, drying)?: A Little Help from another person to put on and taking off regular upper body clothing?: None Help from another person to put on and taking off regular lower body clothing?: A Little 6 Click Score: 20   End of Session Equipment Utilized During Treatment:  Oxygen Nurse Communication: Mobility status  Activity Tolerance: Patient tolerated treatment well Patient left: Other (comment) (ambulating with PT)  OT Visit Diagnosis: Other abnormalities of gait and mobility (R26.89)                Time: 1150-1200 OT Time Calculation (min): 10 min Charges:  OT General Charges $OT Visit: 1 Visit OT Evaluation $OT Eval Low Complexity: 1 Low  Kaeleb Emond OTR/L Acute Rehabilitation Services Office: [phone]   [facility] 12/29/2023, 12:16 PM

## 2023-12-29 NOTE — Plan of Care (Signed)
  Problem: Education: Goal: Knowledge of General Education information will improve Description: Including pain rating scale, medication(s)/side effects and non-pharmacologic comfort measures 12/29/2023 1545 by Zane Herald, Doylene Canning, RN Outcome: Adequate for Discharge 12/29/2023 1001 by Zane Herald, Doylene Canning, RN Outcome: Progressing   Problem: Health Behavior/Discharge Planning: Goal: Ability to manage health-related needs will improve 12/29/2023 1545 by Zane Herald, Doylene Canning, RN Outcome: Adequate for Discharge 12/29/2023 1001 by Zane Herald, Doylene Canning, RN Outcome: Progressing   Problem: Clinical Measurements: Goal: Ability to maintain clinical measurements within normal limits will improve 12/29/2023 1545 by Zane Herald, Doylene Canning, RN Outcome: Adequate for Discharge 12/29/2023 1001 by Zane Herald, Doylene Canning, RN Outcome: Progressing Goal: Will remain free from infection 12/29/2023 1545 by Zane Herald, Doylene Canning, RN Outcome: Adequate for Discharge 12/29/2023 1001 by Zane Herald, Doylene Canning, RN Outcome: Progressing Goal: Diagnostic test results will improve 12/29/2023 1545 by Zane Herald, Doylene Canning, RN Outcome: Adequate for Discharge 12/29/2023 1001 by Zane Herald, Doylene Canning, RN Outcome: Progressing Goal: Respiratory complications will improve 12/29/2023 1545 by Zane Herald, Doylene Canning, RN Outcome: Adequate for Discharge 12/29/2023 1001 by Zane Herald, Doylene Canning, RN Outcome: Progressing Goal: Cardiovascular complication will be avoided 12/29/2023 1545 by Zane Herald, Doylene Canning, RN Outcome: Adequate for Discharge 12/29/2023 1001 by Zane Herald, Doylene Canning, RN Outcome: Progressing   Problem: Activity: Goal: Risk for activity intolerance will decrease 12/29/2023 1545 by Zane Herald, Doylene Canning, RN Outcome: Adequate for Discharge 12/29/2023 1001 by Zane Herald, Doylene Canning,  RN Outcome: Progressing   Problem: Nutrition: Goal: Adequate nutrition will be maintained 12/29/2023 1545 by Zane Herald, Doylene Canning, RN Outcome: Adequate for Discharge 12/29/2023 1001 by Zane Herald, Doylene Canning, RN Outcome: Progressing   Problem: Coping: Goal: Level of anxiety will decrease 12/29/2023 1545 by Zane Herald, Doylene Canning, RN Outcome: Adequate for Discharge 12/29/2023 1001 by Zane Herald, Doylene Canning, RN Outcome: Progressing   Problem: Elimination: Goal: Will not experience complications related to bowel motility 12/29/2023 1545 by Zane Herald, Doylene Canning, RN Outcome: Adequate for Discharge 12/29/2023 1001 by Zane Herald, Doylene Canning, RN Outcome: Progressing Goal: Will not experience complications related to urinary retention 12/29/2023 1545 by Zane Herald, Doylene Canning, RN Outcome: Adequate for Discharge 12/29/2023 1001 by Zane Herald, Doylene Canning, RN Outcome: Progressing   Problem: Pain Managment: Goal: General experience of comfort will improve and/or be controlled 12/29/2023 1545 by Zane Herald, Doylene Canning, RN Outcome: Adequate for Discharge 12/29/2023 1001 by Zane Herald, Doylene Canning, RN Outcome: Progressing   Problem: Safety: Goal: Ability to remain free from injury will improve 12/29/2023 1545 by Zane Herald, Doylene Canning, RN Outcome: Adequate for Discharge 12/29/2023 1001 by Zane Herald, Doylene Canning, RN Outcome: Progressing   Problem: Skin Integrity: Goal: Risk for impaired skin integrity will decrease 12/29/2023 1545 by Zane Herald, Doylene Canning, RN Outcome: Adequate for Discharge 12/29/2023 1001 by Zane Herald, Doylene Canning, RN Outcome: Progressing

## 2023-12-29 NOTE — TOC Transition Note (Signed)
Transition of Care [facility]) - Discharge Note   Patient Details  Name: Victoria Delgado MRN: [id number] Date of Birth: 1941-08-30  Transition of Care [facility]) CM/SW Contact:  Lawerance Sabal, RN Phone Number: 12/29/2023, 2:07 PM   Clinical Narrative:    [name] w patient and spouse at bedside. Home O2 set up through [employer].          Patient Goals and CMS Choice            Discharge Placement                       Discharge Plan and Services Additional resources added to the After Visit Summary for                                       Social Drivers of Health (SDOH) Interventions SDOH Screenings   Food Insecurity: No Food Insecurity (12/28/2023)  Housing: Low Risk  (12/28/2023)  Transportation Needs: No Transportation Needs (12/28/2023)  Utilities: Not At Risk (12/28/2023)  Social Connections: Unknown (12/28/2023)  Tobacco Use: Medium Risk (12/24/2023)     Readmission Risk Interventions     No data to display

## 2023-12-29 NOTE — Plan of Care (Signed)

## 2023-12-29 NOTE — Evaluation (Signed)
Physical Therapy Evaluation Patient Details Name: Victoria Delgado MRN: [id number] DOB: [birth year] Today's Date: 12/29/2023  History of Present Illness  Victoria Delgado is a 83 y.o. female who was being set up for home O2 in the outpatient setting but has not been done yet, presented to the ER with significant shortness of breath.  Patient apparently has been progressively getting sick with the shortness of breath.  Ultimately she came to the ER where she was found to be hypoxic. Her oxygen sats were in the upper 80s but currently in the 90s on 2 L.  Appears patient will need need home O2.  She has been admitted for further evaluation.  Clinical Impression   Pt admitted with above diagnosis. Lives at home with husband, in a single-level home with a few steps to enter; Prior to admission, pt was able to manage overall well with mobilty and amb, had taken to having handheld assist from husband for stability and confidence leading up to this admission; Presents to PT with decr activity tolerance, SOB with exertion; Requires supplemental oxygen to maintain oxygen saturations at acceptable, safe levels with physical activity; Walked the hallway with min assist and CGA for safety; needed 2 seated rest breaks;  Pt currently with functional limitations due to the deficits listed below (see PT Problem List). Pt will benefit from skilled PT to increase their independence and safety with mobility to allow discharge to the venue listed below.       See other PT note of this date for O2 qualifying walk note      If plan is discharge home, recommend the following: A little help with walking and/or transfers   Can travel by private vehicle        Equipment Recommendations Other (comment) (supplemental O2)  Recommendations for Other Services       Functional Status Assessment Patient has had a recent decline in their functional status and demonstrates the ability to make significant improvements in function in  a reasonable and predictable amount of time.     Precautions / Restrictions Precautions Precautions: Fall Restrictions Weight Bearing Restrictions Per Provider Order: No      Mobility  Bed Mobility               General bed mobility comments: sitting in recliner upon arrival and left with PT    Transfers Overall transfer level: Needs assistance Equipment used: 1 person hand held assist Transfers: Sit to/from Stand Sit to Stand: Contact guard assist           General transfer comment: contact guard assistance for safety with mobility, pt with preference for at least single UE support for stability, reports noting preference for ue support since breathing symptoms starting    Ambulation/Gait Ambulation/Gait assistance: Min assist, Contact guard assist Gait Distance (Feet): 100 Feet (x2; with 2 seated rest breaks) Assistive device: 1 person hand held assist (and pushed vitals machine) Gait Pattern/deviations: Step-through pattern Gait velocity: slowed     General Gait Details: Tending to reach out for UE support; desatted on room air  Stairs            Wheelchair Mobility     Tilt Bed    Modified Rankin (Stroke Patients Only)       Balance Overall balance assessment: Mild deficits observed, not formally tested  Pertinent Vitals/Pain Pain Assessment Pain Assessment: No/denies pain    Home Living Family/patient expects to be discharged to:: Private residence Living Arrangements: Spouse/significant other Available Help at Discharge: Family Type of Home: House Home Access: Stairs to enter Entrance Stairs-Rails: Right Entrance Stairs-Number of Steps: 3-4   Home Layout: One level Home Equipment: Agricultural consultant (2 wheels);Cane - single point;Grab bars - tub/shower;Electric scooter      Prior Function Prior Level of Function : Independent/Modified Independent               ADLs  Comments: IADL and driving     Extremity/Trunk Assessment   Upper Extremity Assessment Upper Extremity Assessment: Defer to OT evaluation    Lower Extremity Assessment Lower Extremity Assessment: Generalized weakness (Mild generalized weakness; WFL for simple mobility tasks)    Cervical / Trunk Assessment Cervical / Trunk Assessment: Normal  Communication   Communication Communication: No apparent difficulties  Cognition Arousal: Alert Behavior During Therapy: WFL for tasks assessed/performed Overall Cognitive Status: Within Functional Limits for tasks assessed                                          General Comments General comments (skin integrity, edema, etc.): See other PT note of this date for O2 qualifying walk    Exercises     Assessment/Plan    PT Assessment Patient needs continued PT services  PT Problem List Decreased strength;Decreased activity tolerance;Decreased mobility;Cardiopulmonary status limiting activity       PT Treatment Interventions DME instruction;Gait training;Stair training;Functional mobility training;Therapeutic activities;Therapeutic exercise;Patient/family education    PT Goals (Current goals can be found in the Care Plan section)  Acute Rehab PT Goals Patient Stated Goal: Have supplemental O2 to get home PT Goal Formulation: With patient Time For Goal Achievement: 01/05/24 Potential to Achieve Goals: Good    Frequency Min 1X/week     Co-evaluation               AM-PAC PT "6 Clicks" Mobility  Outcome Measure Help needed turning from your back to your side while in a flat bed without using bedrails?: A Little Help needed moving from lying on your back to sitting on the side of a flat bed without using bedrails?: A Little Help needed moving to and from a bed to a chair (including a wheelchair)?: A Little Help needed standing up from a chair using your arms (e.g., wheelchair or bedside chair)?: A Little Help  needed to walk in hospital room?: A Little Help needed climbing 3-5 steps with a railing? : A Little 6 Click Score: 18    End of Session Equipment Utilized During Treatment: Oxygen Activity Tolerance: Patient tolerated treatment well Patient left: in chair;with call bell/phone within reach;with family/visitor present Nurse Communication: Mobility status;Other (comment) (and needs supplemental O2) PT Visit Diagnosis: Unsteadiness on feet (R26.81);Other abnormalities of gait and mobility (R26.89);Difficulty in walking, not elsewhere classified (R26.2)    Time: 1200-1220 PT Time Calculation (min) (ACUTE ONLY): 20 min   Charges:   PT Evaluation $PT Eval Low Complexity: 1 Low   PT General Charges $$ ACUTE PT VISIT: 1 Visit         Van Clines, PT  Acute Rehabilitation Services Office [phone] Secure Chat welcomed   Levi Aland 12/29/2023, 12:30 PM

## 2023-12-29 NOTE — Progress Notes (Signed)
Physical Therapy Note  (Full PT eval note to follow)  SATURATION QUALIFICATIONS: (This note is used to comply with regulatory documentation for home oxygen)  Patient Saturations on Room Air at Rest = 89%  Patient Saturations on Room Air while Ambulating = 86%  Patient Saturations on 4 Liters of oxygen while Ambulating = 93%  Please briefly explain why patient needs home oxygen: Patient requires supplemental oxygen to maintain oxygen saturations at acceptable, safe levels with physical activity.  Van Clines, PT  Acute Rehabilitation Services Office [phone] Secure Chat welcomed

## 2023-12-29 NOTE — Progress Notes (Signed)
[facility] night cross cover note:   I was notified by RN that the patient is refusing her evening oral amiodarone.   Newton Pigg, DO Hospitalist

## 2023-12-30 NOTE — Telephone Encounter (Signed)
Med list updated

## 2023-12-30 NOTE — Telephone Encounter (Signed)
Order has been placed for 4 L with activity and sleep.

## 2023-12-30 NOTE — Telephone Encounter (Signed)
I have called and spoke with Mrs. Victoria Delgado, for now I am ok with her to restart metoprolol succinate at 25mg  daily. Her blood pressure seems to tolerate this dose. SBP in the 120s. She is aware to contact cardiology if her SBP is persistently less than 100. She is concerned about the pulmonary toxicity effect of amiodarone, I asked her to discuss with afib clinic to see if any other alternative antiarrhythmic will be a good option for her. Something that does not have significant pulmonary toxicity like amiodarone. She has already established with afib clinic, she will call to schedule.

## 2024-01-03 MED ORDER — PREDNISONE 10 MG PO TABS: ORAL_TABLET | ORAL | 0 refills | Status: DC

## 2024-01-03 NOTE — Addendum Note (Signed)
Addended by: Luciano Cutter on: 01/03/2024 10:44 AM   Modules accepted: Orders

## 2024-01-05 ENCOUNTER — Inpatient Hospital Stay (HOSPITAL_COMMUNITY): Payer: Medicare Other

## 2024-01-05 ENCOUNTER — Encounter (HOSPITAL_COMMUNITY): Payer: Self-pay | Admitting: Emergency Medicine

## 2024-01-05 ENCOUNTER — Emergency Department (HOSPITAL_COMMUNITY): Payer: Medicare Other

## 2024-01-05 ENCOUNTER — Inpatient Hospital Stay (HOSPITAL_COMMUNITY)
Admission: EM | Admit: 2024-01-05 | Discharge: 2024-01-14 | DRG: 291 | Disposition: A | Discharge status: Home / Self Care | Payer: Medicare Other | Attending: Internal Medicine | Admitting: Internal Medicine

## 2024-01-05 DIAGNOSIS — K922 Gastrointestinal hemorrhage, unspecified: Secondary | ICD-10-CM

## 2024-01-05 DIAGNOSIS — Z87891 Personal history of nicotine dependence: Secondary | ICD-10-CM

## 2024-01-05 DIAGNOSIS — Z9842 Cataract extraction status, left eye: Secondary | ICD-10-CM

## 2024-01-05 DIAGNOSIS — I110 Hypertensive heart disease with heart failure: Secondary | ICD-10-CM | POA: Diagnosis present

## 2024-01-05 DIAGNOSIS — G4730 Sleep apnea, unspecified: Secondary | ICD-10-CM

## 2024-01-05 DIAGNOSIS — J189 Pneumonia, unspecified organism: Secondary | ICD-10-CM | POA: Diagnosis present

## 2024-01-05 DIAGNOSIS — R9431 Abnormal electrocardiogram [ECG] [EKG]: Secondary | ICD-10-CM | POA: Diagnosis not present

## 2024-01-05 DIAGNOSIS — I421 Obstructive hypertrophic cardiomyopathy: Secondary | ICD-10-CM | POA: Diagnosis present

## 2024-01-05 DIAGNOSIS — Z66 Do not resuscitate: Secondary | ICD-10-CM | POA: Diagnosis present

## 2024-01-05 DIAGNOSIS — D62 Acute posthemorrhagic anemia: Secondary | ICD-10-CM | POA: Diagnosis not present

## 2024-01-05 DIAGNOSIS — I5030 Unspecified diastolic (congestive) heart failure: Secondary | ICD-10-CM | POA: Diagnosis present

## 2024-01-05 DIAGNOSIS — Z6841 Body Mass Index (BMI) 40.0 and over, adult: Secondary | ICD-10-CM

## 2024-01-05 DIAGNOSIS — J440 Chronic obstructive pulmonary disease with acute lower respiratory infection: Secondary | ICD-10-CM | POA: Diagnosis present

## 2024-01-05 DIAGNOSIS — Z825 Family history of asthma and other chronic lower respiratory diseases: Secondary | ICD-10-CM

## 2024-01-05 DIAGNOSIS — Z96643 Presence of artificial hip joint, bilateral: Secondary | ICD-10-CM | POA: Diagnosis present

## 2024-01-05 DIAGNOSIS — Z7901 Long term (current) use of anticoagulants: Secondary | ICD-10-CM

## 2024-01-05 DIAGNOSIS — E662 Morbid (severe) obesity with alveolar hypoventilation: Secondary | ICD-10-CM | POA: Diagnosis present

## 2024-01-05 DIAGNOSIS — I5033 Acute on chronic diastolic (congestive) heart failure: Secondary | ICD-10-CM | POA: Diagnosis present

## 2024-01-05 DIAGNOSIS — R7989 Other specified abnormal findings of blood chemistry: Secondary | ICD-10-CM

## 2024-01-05 DIAGNOSIS — Z8616 Personal history of COVID-19: Secondary | ICD-10-CM

## 2024-01-05 DIAGNOSIS — Z833 Family history of diabetes mellitus: Secondary | ICD-10-CM

## 2024-01-05 DIAGNOSIS — I10 Essential (primary) hypertension: Secondary | ICD-10-CM | POA: Diagnosis not present

## 2024-01-05 DIAGNOSIS — Z79899 Other long term (current) drug therapy: Secondary | ICD-10-CM

## 2024-01-05 DIAGNOSIS — Z952 Presence of prosthetic heart valve: Secondary | ICD-10-CM | POA: Diagnosis not present

## 2024-01-05 DIAGNOSIS — Z961 Presence of intraocular lens: Secondary | ICD-10-CM | POA: Diagnosis present

## 2024-01-05 DIAGNOSIS — E876 Hypokalemia: Secondary | ICD-10-CM | POA: Diagnosis not present

## 2024-01-05 DIAGNOSIS — R0902 Hypoxemia: Secondary | ICD-10-CM | POA: Diagnosis present

## 2024-01-05 DIAGNOSIS — E119 Type 2 diabetes mellitus without complications: Secondary | ICD-10-CM

## 2024-01-05 DIAGNOSIS — I2510 Atherosclerotic heart disease of native coronary artery without angina pectoris: Secondary | ICD-10-CM | POA: Diagnosis present

## 2024-01-05 DIAGNOSIS — E66813 Obesity, class 3: Secondary | ICD-10-CM | POA: Diagnosis present

## 2024-01-05 DIAGNOSIS — Z823 Family history of stroke: Secondary | ICD-10-CM

## 2024-01-05 DIAGNOSIS — Z9861 Coronary angioplasty status: Secondary | ICD-10-CM

## 2024-01-05 DIAGNOSIS — K219 Gastro-esophageal reflux disease without esophagitis: Secondary | ICD-10-CM

## 2024-01-05 DIAGNOSIS — Z8249 Family history of ischemic heart disease and other diseases of the circulatory system: Secondary | ICD-10-CM

## 2024-01-05 DIAGNOSIS — J9621 Acute and chronic respiratory failure with hypoxia: Secondary | ICD-10-CM | POA: Diagnosis not present

## 2024-01-05 DIAGNOSIS — E782 Mixed hyperlipidemia: Secondary | ICD-10-CM | POA: Diagnosis present

## 2024-01-05 DIAGNOSIS — Z9981 Dependence on supplemental oxygen: Secondary | ICD-10-CM

## 2024-01-05 DIAGNOSIS — I480 Paroxysmal atrial fibrillation: Secondary | ICD-10-CM

## 2024-01-05 DIAGNOSIS — R0602 Shortness of breath: Secondary | ICD-10-CM | POA: Diagnosis not present

## 2024-01-05 DIAGNOSIS — I422 Other hypertrophic cardiomyopathy: Secondary | ICD-10-CM | POA: Diagnosis not present

## 2024-01-05 DIAGNOSIS — Z9841 Cataract extraction status, right eye: Secondary | ICD-10-CM

## 2024-01-05 DIAGNOSIS — I342 Nonrheumatic mitral (valve) stenosis: Secondary | ICD-10-CM | POA: Diagnosis present

## 2024-01-05 DIAGNOSIS — Z5320 Procedure and treatment not carried out because of patient's decision for unspecified reasons: Secondary | ICD-10-CM

## 2024-01-05 DIAGNOSIS — J9622 Acute and chronic respiratory failure with hypercapnia: Secondary | ICD-10-CM | POA: Diagnosis not present

## 2024-01-05 DIAGNOSIS — E1165 Type 2 diabetes mellitus with hyperglycemia: Secondary | ICD-10-CM | POA: Diagnosis present

## 2024-01-05 DIAGNOSIS — I050 Rheumatic mitral stenosis: Secondary | ICD-10-CM | POA: Diagnosis not present

## 2024-01-05 DIAGNOSIS — K921 Melena: Secondary | ICD-10-CM | POA: Diagnosis not present

## 2024-01-05 LAB — I-STAT VENOUS BLOOD GAS, ED
Acid-Base Excess: 9 mmol/L — ABNORMAL HIGH (ref 0.0–2.0)
Bicarbonate: 33.8 mmol/L — ABNORMAL HIGH (ref 20.0–28.0)
Calcium, Ion: 1.11 mmol/L — ABNORMAL LOW (ref 1.15–1.40)
HCT: 29 % — ABNORMAL LOW (ref 36.0–46.0)
Hemoglobin: 9.9 g/dL — ABNORMAL LOW (ref 12.0–15.0)
O2 Saturation: 71 %
Potassium: 3.9 mmol/L (ref 3.5–5.1)
Sodium: 137 mmol/L (ref 135–145)
TCO2: 35 mmol/L — ABNORMAL HIGH (ref 22–32)
pCO2, Ven: 45.5 mm[Hg] (ref 44–60)
pH, Ven: 7.479 — ABNORMAL HIGH (ref 7.25–7.43)
pO2, Ven: 35 mm[Hg] (ref 32–45)

## 2024-01-05 LAB — RESP PANEL BY RT-PCR (RSV, FLU A&B, COVID)  RVPGX2
Influenza A by PCR: NEGATIVE
Influenza B by PCR: NEGATIVE
Resp Syncytial Virus by PCR: NEGATIVE
SARS Coronavirus 2 by RT PCR: NEGATIVE

## 2024-01-05 LAB — CBC WITH DIFFERENTIAL/PLATELET
Abs Immature Granulocytes: 0.16 10*3/uL — ABNORMAL HIGH (ref 0.00–0.07)
Basophils Absolute: 0 10*3/uL (ref 0.0–0.1)
Basophils Relative: 0 %
Eosinophils Absolute: 0.2 10*3/uL (ref 0.0–0.5)
Eosinophils Relative: 2 %
HCT: 32.1 % — ABNORMAL LOW (ref 36.0–46.0)
Hemoglobin: 10.2 g/dL — ABNORMAL LOW (ref 12.0–15.0)
Immature Granulocytes: 2 %
Lymphocytes Relative: 9 %
Lymphs Abs: 1 10*3/uL (ref 0.7–4.0)
MCH: 28.7 pg (ref 26.0–34.0)
MCHC: 31.8 g/dL (ref 30.0–36.0)
MCV: 90.2 fL (ref 80.0–100.0)
Monocytes Absolute: 0.7 10*3/uL (ref 0.1–1.0)
Monocytes Relative: 7 %
Neutro Abs: 8.5 10*3/uL — ABNORMAL HIGH (ref 1.7–7.7)
Neutrophils Relative %: 80 %
Platelets: 377 10*3/uL (ref 150–400)
RBC: 3.56 MIL/uL — ABNORMAL LOW (ref 3.87–5.11)
RDW: 19.3 % — ABNORMAL HIGH (ref 11.5–15.5)
WBC: 10.7 10*3/uL — ABNORMAL HIGH (ref 4.0–10.5)
nRBC: 0.7 % — ABNORMAL HIGH (ref 0.0–0.2)

## 2024-01-05 LAB — BASIC METABOLIC PANEL
Anion gap: 12 (ref 5–15)
BUN: 16 mg/dL (ref 8–23)
CO2: 28 mmol/L (ref 22–32)
Calcium: 8.9 mg/dL (ref 8.9–10.3)
Chloride: 96 mmol/L — ABNORMAL LOW (ref 98–111)
Creatinine, Ser: 0.91 mg/dL (ref 0.44–1.00)
GFR, Estimated: >60 mL/min (ref >60)
Glucose, Bld: 185 mg/dL — ABNORMAL HIGH (ref 70–99)
Potassium: 3.9 mmol/L (ref 3.5–5.1)
Sodium: 136 mmol/L (ref 135–145)

## 2024-01-05 LAB — TROPONIN I (HIGH SENSITIVITY)
Troponin I (High Sensitivity): 31 ng/L — ABNORMAL HIGH (ref <18)
Troponin I (High Sensitivity): 30 ng/L — ABNORMAL HIGH (ref <18)

## 2024-01-05 LAB — BRAIN NATRIURETIC PEPTIDE: B Natriuretic Peptide: 241.7 pg/mL — ABNORMAL HIGH (ref 0.0–100.0)

## 2024-01-05 LAB — PROCALCITONIN: Procalcitonin: <0.1 ng/mL

## 2024-01-05 LAB — HEMOGLOBIN A1C
Hgb A1c MFr Bld: 6.6 % — ABNORMAL HIGH (ref 4.8–5.6)
Mean Plasma Glucose: 142.72 mg/dL

## 2024-01-05 MED ORDER — PREDNISONE 20 MG PO TABS
50.0000 mg | ORAL_TABLET | Freq: Every day | ORAL | Status: DC
  Administered 2024-01-06 – 2024-01-14 (×9): 50 mg via ORAL
  Filled 2024-01-05 (×4): qty 1
  Filled 2024-01-05: qty 2
  Filled 2024-01-05 (×5): qty 1

## 2024-01-05 MED ORDER — SODIUM CHLORIDE 0.9 % IV SOLN: 1.0000 g | Freq: Once | INTRAVENOUS | Status: DC

## 2024-01-05 MED ORDER — SODIUM CHLORIDE 0.9 % IV BOLUS
500.0000 mL | Freq: Once | INTRAVENOUS | Status: AC
  Administered 2024-01-05: 500 mL via INTRAVENOUS

## 2024-01-05 MED ORDER — APIXABAN 5 MG PO TABS
5.0000 mg | ORAL_TABLET | Freq: Two times a day (BID) | ORAL | Status: DC
  Administered 2024-01-05 – 2024-01-11 (×14): 5 mg via ORAL
  Filled 2024-01-05 (×14): qty 1

## 2024-01-05 MED ORDER — POLYVINYL ALCOHOL 1.4 % OP SOLN: 1.0000 [drp] | Freq: Two times a day (BID) | OPHTHALMIC | Status: DC | PRN

## 2024-01-05 MED ORDER — BUDESONIDE 0.5 MG/2ML IN SUSP
0.5000 mg | Freq: Two times a day (BID) | RESPIRATORY_TRACT | Status: DC
  Administered 2024-01-05 – 2024-01-14 (×19): 0.5 mg via RESPIRATORY_TRACT
  Filled 2024-01-05 (×19): qty 2

## 2024-01-05 MED ORDER — SODIUM CHLORIDE 0.9 % IV SOLN
2.0000 g | INTRAVENOUS | Status: AC
  Administered 2024-01-05 – 2024-01-09 (×5): 2 g via INTRAVENOUS
  Filled 2024-01-05 (×5): qty 20

## 2024-01-05 MED ORDER — IOHEXOL 350 MG/ML SOLN
75.0000 mL | Freq: Once | INTRAVENOUS | Status: AC | PRN
  Administered 2024-01-05: 75 mL via INTRAVENOUS

## 2024-01-05 MED ORDER — MAGNESIUM SULFATE 2 GM/50ML IV SOLN
2.0000 g | Freq: Once | INTRAVENOUS | Status: AC
  Administered 2024-01-05: 2 g via INTRAVENOUS
  Filled 2024-01-05: qty 50

## 2024-01-05 MED ORDER — ARFORMOTEROL TARTRATE 15 MCG/2ML IN NEBU
15.0000 ug | INHALATION_SOLUTION | Freq: Two times a day (BID) | RESPIRATORY_TRACT | Status: DC
  Administered 2024-01-05 – 2024-01-14 (×19): 15 ug via RESPIRATORY_TRACT
  Filled 2024-01-05 (×19): qty 2

## 2024-01-05 MED ORDER — IPRATROPIUM BROMIDE 0.02 % IN SOLN
0.5000 mg | Freq: Four times a day (QID) | RESPIRATORY_TRACT | Status: DC
  Administered 2024-01-05 (×2): 0.5 mg via RESPIRATORY_TRACT
  Filled 2024-01-05 (×2): qty 2.5

## 2024-01-05 MED ORDER — LEVALBUTEROL HCL 0.63 MG/3ML IN NEBU
0.6300 mg | INHALATION_SOLUTION | Freq: Once | RESPIRATORY_TRACT | Status: AC
  Administered 2024-01-05: 0.63 mg via RESPIRATORY_TRACT
  Filled 2024-01-05: qty 3

## 2024-01-05 MED ORDER — ATROPINE SULFATE 1 % OP SOLN
1.0000 [drp] | Freq: Every day | OPHTHALMIC | Status: DC
  Administered 2024-01-06 – 2024-01-14 (×9): 1 [drp] via OPHTHALMIC
  Filled 2024-01-05 (×3): qty 2

## 2024-01-05 MED ORDER — LEVALBUTEROL HCL 1.25 MG/0.5ML IN NEBU
1.2500 mg | INHALATION_SOLUTION | Freq: Four times a day (QID) | RESPIRATORY_TRACT | Status: DC
  Administered 2024-01-05 (×2): 1.25 mg via RESPIRATORY_TRACT
  Filled 2024-01-05 (×4): qty 0.5

## 2024-01-05 MED ORDER — EZETIMIBE 10 MG PO TABS
10.0000 mg | ORAL_TABLET | Freq: Every day | ORAL | Status: DC
  Administered 2024-01-05 – 2024-01-14 (×10): 10 mg via ORAL
  Filled 2024-01-05 (×10): qty 1

## 2024-01-05 MED ORDER — PREDNISONE 5 MG PO TABS
50.0000 mg | ORAL_TABLET | Freq: Once | ORAL | Status: AC
  Administered 2024-01-05: 50 mg via ORAL
  Filled 2024-01-05: qty 2

## 2024-01-05 MED ORDER — FUROSEMIDE 10 MG/ML IJ SOLN
40.0000 mg | Freq: Once | INTRAMUSCULAR | Status: AC
  Administered 2024-01-05: 40 mg via INTRAVENOUS
  Filled 2024-01-05: qty 4

## 2024-01-05 MED ORDER — SODIUM CHLORIDE 0.9 % IV SOLN
100.0000 mg | Freq: Two times a day (BID) | INTRAVENOUS | Status: DC
  Administered 2024-01-05 – 2024-01-10 (×9): 100 mg via INTRAVENOUS
  Filled 2024-01-05 (×11): qty 100

## 2024-01-05 MED ORDER — IPRATROPIUM BROMIDE 0.02 % IN SOLN
0.5000 mg | Freq: Once | RESPIRATORY_TRACT | Status: AC
  Administered 2024-01-05: 0.5 mg via RESPIRATORY_TRACT
  Filled 2024-01-05: qty 2.5

## 2024-01-05 MED ORDER — SODIUM CHLORIDE 0.9% FLUSH
3.0000 mL | Freq: Two times a day (BID) | INTRAVENOUS | Status: DC
  Administered 2024-01-05 – 2024-01-14 (×17): 3 mL via INTRAVENOUS

## 2024-01-05 MED ORDER — SODIUM CHLORIDE 0.9 % IV SOLN
100.0000 mg | Freq: Once | INTRAVENOUS | Status: AC
  Administered 2024-01-05: 100 mg via INTRAVENOUS
  Filled 2024-01-05: qty 100

## 2024-01-05 NOTE — H&P (Signed)
History and Physical    Patient: Victoria Delgado UXL:[id number] DOB: [birth year] DOA: 01/05/2024 DOS: the patient was seen and examined on 01/05/2024 PCP: Charlane Ferretti, DO  Patient coming from: Home  Chief Complaint:  Chief Complaint  Patient presents with   Shortness of Breath   HPI: Victoria Delgado is a 83 y.o. female with medical history significant of hypertension, CAD, paroxysmal atrial fibrillation chronic anticoagulation, HOCM, severe COPD, OSA/OHS on CPAP, morbid obesity, and GERD presents with complaints of worsening shortness of breath.    She was just recently hospitalized from 1/25-1/26 with exertional dyspnea.  She had been sent home on 4 L of oxygen at that time.  She has been experiencing worsening shortness of breath despite being on home oxygen therapy following a recent hospitalization for respiratory failure. She is unable to walk across a room without gasping for air, with her oxygen saturation dropping to as low as 80% on four liters of oxygen. Increasing the oxygen to five liters did not improve her symptoms.  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 No recent changes in weight and no leg swelling. She does not check her weight daily.  She has had some associated left arm pain recently. Deines having chest pain, fevers, or chills.  She has a history of RSV last year and COVID the year before that. A CT scan of her chest showed no signs of a blood clot but did reveal an enlarged heart and fluid in her lungs, along with a small to moderate right pleural effusion. She is scheduled for a sleep study in March, but she currently cannot use her home CPAP machine as it exacerbates her feeling of breathlessness.  No history of smoking or exposure to smoke. 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   On admission into the emergency  department patient was noted to be afebrile with respirations 20-27, blood pressure 104/51-123/68, and saturations currently maintained on 6 L of nasal cannula oxygen.  Labs noted WBC 10.7, hemoglobin 10.2, glucose 185, BNP 241.7, high-sensitivity troponin 31-> 30.  Venous blood gas noted pH 7.479, pCO2 45.5, and pO2 35.  CT angiogram of the chest noted no signs of a pulmonary embolism, cardiomegaly with pulmonary edema with small moderate right and small left layering pleural effusions, and dependent bibasilar atelectasis or consolidation with few new areas throughout both lungs.   She had been given levalbuterol/and ipratropium nebs, magnesium sulfate 2 g IV, prednisone 50 mg p.o., doxycycline, and Rocephin.   Review of Systems: As mentioned in the history of present illness. All other systems reviewed and are negative. Past Medical History:  Diagnosis Date   Abdominal pain    Arthritis    osteoarthritis-hips,knees, back, hands   Asymmetric septal hypertrophy    Atrial fibrillation (HCC)    CAD (coronary artery disease)    2 drug-eluting stents placed in the circumflex leading into a marginal.  May 2017.  [name].   catheterization on 03/19/2018, this showed patent stent in the proximal to mid left circumflex artery, 20% ostial left circumflex artery disease, 40% OM 3 disease, widely patent stent in the ostial D1, 20% ostial to proximal LAD disease, 20% mid LAD disease.   Cataract    corrective surgery done   Fatty liver    Gallstones    GERD (gastroesophageal reflux disease)  Hepatitis    hepatitis A; past hx.,many yrs ago ? food source   Hernia, hiatal    Hyperlipidemia    Hypertension    Obesity    Sleep apnea    CPAP   Past Surgical History:  Procedure Laterality Date   APPENDECTOMY  2004   CARDIOVERSION N/A 01/03/2022   Procedure: CARDIOVERSION;  Surgeon: Sande Rives, MD;  Location: [facility] ENDOSCOPY;  Service: Cardiovascular;  Laterality: N/A;   CARDIOVERSION N/A  09/10/2023   Procedure: CARDIOVERSION;  Surgeon: Jodelle Red, MD;  Location: [facility] INVASIVE CV LAB;  Service: Cardiovascular;  Laterality: N/A;   CATARACT EXTRACTION W/ INTRAOCULAR LENS IMPLANT     both eyes   CHOLECYSTECTOMY     LEFT HEART CATH AND CORONARY ANGIOGRAPHY N/A 03/19/2018   Procedure: LEFT HEART CATH AND CORONARY ANGIOGRAPHY;  Surgeon: Kathleene Hazel, MD;  Location: [facility] INVASIVE CV LAB;  Service: Cardiovascular;  Laterality: N/A;   NM MYOCAR PERF WALL MOTION  08/15/04   No ischemia   RETINAL DETACHMENT SURGERY Bilateral    remains with slight hazy vision with lights   RIGHT/LEFT HEART CATH AND CORONARY ANGIOGRAPHY N/A 07/11/2020   Procedure: RIGHT/LEFT HEART CATH AND CORONARY ANGIOGRAPHY;  Surgeon: Tonny Bollman, MD;  Location: [facility] INVASIVE CV LAB;  Service: Cardiovascular;  Laterality: N/A;   TOTAL HIP ARTHROPLASTY Right 08/04/2014   Procedure: RIGHT TOTAL HIP ARTHROPLASTY ANTERIOR APPROACH;  Surgeon: Loanne Drilling, MD;  Location: [facility] ORS;  Service: Orthopedics;  Laterality: Right;   TOTAL HIP ARTHROPLASTY Left 01/05/2015   Procedure: LEFT TOTAL HIP ARTHROPLASTY ANTERIOR APPROACH;  Surgeon: Loanne Drilling, MD;  Location: [facility] ORS;  Service: Orthopedics;  Laterality: Left;   US ECHOCARDIOGRAPHY  06/25/2012   Moderate ASH,LV hyperdynamic,LA is mod. dilated,trace MR,TR,AI   Social History:  reports that she quit smoking about 35 years ago. Her smoking use included cigarettes. She started smoking about 55 years ago. She has a 20 pack-year smoking history. She has never used smokeless tobacco. She reports that she does not drink alcohol and does not use drugs.  Allergies  Allergen Reactions   Codeine Nausea Only   Kenalog [Triamcinolone Acetonide] Other (See Comments)    Had A.fib after she received the injection."Steroid meds"   Kenalog [Triamcinolone] Rash    Other reaction(s): AFIB   Pantoprazole Rash    Other reaction(s): chronic upper GI pain all the way to back    Relafen [Nabumetone] Other (See Comments)    Edema    Albuterol Other (See Comments)    Causes SVT   Epinephrine Palpitations   Penicillins Rash    40 years ago, injection site was red and swollen.  rash, facial/tongue/throat swelling, SOB or lightheadedness with hypotension: Yes Has patient had a PCN reaction causing immediate  Has patient had a PCN reaction causing severe rash involving mucus membranes or skin necrosis: NO Has patient had a PCN reaction that required hospitalization. No  Has patient had a PCN reaction occurring within the last 10 years: No If all of the above answers are "NO", then may proceed with Cephalosporin use.     Tramadol Other (See Comments)    Auditory halllucinations    Family History  Problem Relation Age of Onset   Hypertension Mother    CVA Mother    Emphysema Mother        smoked   Lung cancer Father 24       asbestos exposure   Cancer - Lung Father  smoked and asbestos exp   Emphysema Sister        smoked   Esophageal cancer Other        nephew   Diabetes Other        nephew   Colon cancer Neg Hx    Pancreatic cancer Neg Hx    Kidney disease Neg Hx    Liver disease Neg Hx     Prior to Admission medications   Medication Sig Start Date End Date Taking? Authorizing Provider  ACETAMINOPHEN PO Take 650 mg by mouth daily as needed for moderate pain or headache.    [provider]  amiodarone (PACERONE) 100 MG tablet Take 1 tablet (100 mg total) by mouth daily. 12/30/23   Almon Hercules, MD  apixaban (ELIQUIS) 5 MG TABS tablet Take 1 tablet (5 mg total) by mouth 2 (two) times daily. 08/14/23   Azalee Course, PA  atropine 1 % ophthalmic solution Place 1 drop into the right eye daily.    [provider]  cholecalciferol (VITAMIN D3) 25 MCG (1000 UNIT) tablet Take 1,000 Units by mouth 2 (two) times daily.    [provider]  Coenzyme Q10 (COQ10) 100 MG CAPS Take 100 mg by mouth daily.    [provider]   CRESTOR 20 MG tablet TAKE 1 TABLET(20 MG) BY MOUTH DAILY 01/30/23   Rollene Rotunda, MD  ezetimibe (ZETIA) 10 MG tablet TAKE 1 TABLET(10 MG) BY MOUTH DAILY 08/20/23   Azalee Course, PA  fluticasone-salmeterol (WIXELA INHUB) 100-50 MCG/ACT AEPB Inhale 1 puff into the lungs 2 (two) times daily. 12/24/23   Luciano Cutter, MD  furosemide (LASIX) 40 MG tablet Take 1 tablet (40 mg total) by mouth daily. 06/17/23   Rollene Rotunda, MD  levalbuterol [facility] HFA) 45 MCG/ACT inhaler Inhale 1-2 puffs into the lungs every 6 (six) hours as needed for wheezing. 06/26/22   Omar Person, MD  metoprolol succinate (TOPROL-XL) 25 MG 24 hr tablet Take 25 mg by mouth daily.    [provider]  nitroGLYCERIN (NITROSTAT) 0.4 MG SL tablet Place 1 tablet (0.4 mg total) under the tongue every 5 (five) minutes as needed for chest pain. 05/07/16   Rosalio Macadamia, NP  omeprazole (PRILOSEC) 20 MG capsule Take 20 mg by mouth 2 (two) times daily before a meal.    [provider]  predniSONE (DELTASONE) 10 MG tablet Take 4 tablets (40 mg total) by mouth daily with breakfast for 2 days, THEN 3 tablets (30 mg total) daily with breakfast for 2 days, THEN 2 tablets (20 mg total) daily with breakfast for 2 days, THEN 1 tablet (10 mg total) daily with breakfast for 2 days. 01/03/24 01/11/24  Luciano Cutter, MD  Propylene Glycol (SYSTANE BALANCE) 0.6 % SOLN Place 1 drop into both eyes 2 (two) times daily as needed (dry eyes).    [provider]  Tiotropium Bromide Monohydrate (SPIRIVA RESPIMAT) 1.25 MCG/ACT AERS Inhale 2 puffs into the lungs daily. 05/30/23   Omar Person, MD    Physical Exam: Vitals:   01/05/24 0534 01/05/24 0600 01/05/24 0630 01/05/24 0655  BP: 123/68 (!) 104/51 (!) 107/53   Pulse: 97 87 89   Resp: (!) 27 (!) 25 20   Temp: 98.8 F (37.1 C)     TempSrc: Oral     SpO2: 95% 97% 95% 97%  Weight:      Height:       Constitutional: Elderly female who appears to  be respiratory  distress Eyes: PERRL, lids and conjunctivae normal ENMT: Mucous membranes are moist. Normal dentition.  Neck: normal, supple,  Respiratory: Decreased overall aeration with positive crackles appreciated.  Patient currently on 6 L of nasal cannula oxygen with O2 saturations maintained above 90%.  She is able to speak in shortened sentences. Cardiovascular: Regular rate and rhythm, no murmurs / rubs / gallops.  Trace to 1+ lower extremity edema present. Abdomen: no tenderness, no masses palpated.  Bowel sounds positive.  Musculoskeletal: no clubbing / cyanosis. No joint deformity upper and lower extremities. Good ROM, no contractures. Normal muscle tone.  Skin: no rashes, lesions, ulcers. No induration Neurologic: CN 2-12 grossly intact. Sensation intact, DTR normal. Strength 5/5 in all 4.  Psychiatric: Normal judgment and insight. Alert and oriented x 3. Normal mood.   Data Reviewed:  EKG revealed sinus rhythm at 97 bpm with a left anterior fascicular block and borderline prolonged QTc of 498. Reviewed labs, imaging, and pertinent records as documented  Assessment and Plan:  Acute on chronic respiratory failure with hypoxia Suspected diastolic congestive heart failure exacerbation Patient presented with complaints of worsening shortness of breath and orthopnea.  Just recently discharged from the hospital for dyspnea on exertion placed on 4 L of nasal cannula oxygen.  Denies any significant change in weight.  Noted to be hypoxic into the 80s even on 4 L with ambulation.  On physical exam patient noted to have crackles in both lung fields.  CT angiogram of the chest obtained which noted cardiomegaly with pulmonary edema and layering pleural effusions.  BNP was 241.7.  Last echocardiogram noted EF to be 70 to 75% with indeterminate diastolic parameters and moderate left ventricular hypertrophy. -Admit to a telemetry bed -Continuous pulse oximetry with nasal cannula oxygen to maintain O2 saturation  greater than -Incentive spirometry and flutter valve -Check echocardiogram -Lasix 40 mg IV twice daily -PT to evaluate and treat   Possible pneumonia Bronchitis CT angiogram of the chest I also noted bilateral opacities with superimposed infection cannot be ruled out.  Patient was started on antibiotics of Rocephin and doxycycline. -Check procalcitonin -Continue Rocephin and doxycycline.  Adjust antibiotics as deemed medically -Scheduled levalbuterol and ipratropium nebs every 6 hours -Brovana and budesonide nebs twice daily  -Continue prednisone -Mucinex  Elevated troponin Acute on chronic.  High-sensitivity troponin 31-> 30.  Normal troponin slightly higher than previous but still essentially flat.  Suspect this secondary to demand -Continue to monitor  Paroxysmal atrial fibrillation anticoagulation Patient currently in sinus rhythm -Continue Eliquis  Prolonged QT interval On admission QT C noted to be 498.    Essential hypertension Patient's blood pressures had been noted to be soft during last hospitalization for which metoprolol succinate had been recommended to be discontinued.  Diabetes mellitus type 2 with hyperglycemia without long-term use of insulin On admission glucose was noted to be elevated at 185.  Last hemoglobin A1c was noted to be 7.1 when checked 12/07/2022.  Hyperlipidemia -Continue Crestor and Zetia  GERD -Continue pharmacy substitution of Pepcid for omeprazole as patient has allergy to recommended substitution Protonix  OSA  Patient reports that she has not been using her CPAP mask due to feeling [place] though it is suffocating her at night.  She is scheduled to have a sleep study in either March or April of this year  Morbid obesity BMI 41.9 kg 2020   DVT prophylaxis: Eliquis Advance Care Planning:   Code Status: Prior    Consults: Cardiology  Family  Communication: Patient's husband updated at bedside  Severity of Illness: The appropriate  patient status for this patient is INPATIENT. Inpatient status is judged to be reasonable and necessary in order to provide the required intensity of service to ensure the patient's safety. The patient's presenting symptoms, physical exam findings, and initial radiographic and laboratory data in the context of their chronic comorbidities is felt to place them at high risk for further clinical deterioration. Furthermore, it is not anticipated that the patient will be medically stable for discharge from the hospital within 2 midnights of admission.   * I certify that at the point of admission it is my clinical judgment that the patient will require inpatient hospital care spanning beyond 2 midnights from the point of admission due to high intensity of service, high risk for further deterioration and high frequency of surveillance required.*  Author: Clydie Braun, MD 01/05/2024 9:21 AM  For on call review [email or web address].

## 2024-01-05 NOTE — Plan of Care (Signed)

## 2024-01-05 NOTE — ED Triage Notes (Signed)
Patient BIB EMS from home. Patient with worsening dyspnea on exertion for the last few days. Patient denies any sick contacts. Patient was in the ED a week ago for similar symptoms.  Patient is on 5L [place] at baseline; history of COPD, CHF, Afib; patient was 95% RA when EMS arrived, they placed her on a NRB 15L for work of breathing.

## 2024-01-05 NOTE — ED Provider Notes (Signed)
[facility] Provider Note  CSN: [id number] Arrival date & time: 01/05/24 4010  Chief Complaint(s) Shortness of Breath  HPI Victoria Delgado is a 83 y.o. female with past medical history as below, significant for A-fib, GERD, HLD, HTN, OSA on CPAP, CAD who presents to the ED with complaint of dib  She was admitted on 1/25 and discharged the next day secondary to difficulty breathing.  Reports that she has not felt any better since her discharge.  She was on 4 L nasal cannula, was increased to 5 L by her pulmonologist.  Is also planning to start steroids with her pulmonologist but she is very hesitant to start any steroids because she had some issue with Kenalog previously that she feels gave her A-fib.  She has been compliant with her home medications.  When she ambulates she feels short of breath despite her pulse ox being normal.  Cough unchanged from prior, nonproductive.  No fevers or chills.  No chest pain.  No nausea or vomiting.  Past Medical History Past Medical History:  Diagnosis Date   Abdominal pain    Arthritis    osteoarthritis-hips,knees, back, hands   Asymmetric septal hypertrophy    Atrial fibrillation (HCC)    CAD (coronary artery disease)    2 drug-eluting stents placed in the circumflex leading into a marginal.  May 2017.  [name].   catheterization on 03/19/2018, this showed patent stent in the proximal to mid left circumflex artery, 20% ostial left circumflex artery disease, 40% OM 3 disease, widely patent stent in the ostial D1, 20% ostial to proximal LAD disease, 20% mid LAD disease.   Cataract    corrective surgery done   Fatty liver    Gallstones    GERD (gastroesophageal reflux disease)    Hepatitis    hepatitis A; past hx.,many yrs ago ? 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) 02/04/2022   SOB (shortness of breath) 12/26/2021   Persistent atrial fibrillation (HCC) 12/21/2021   Hypercoagulable state due to persistent atrial fibrillation (HCC) 12/21/2021   Atrial fibrillation with rapid ventricular response (HCC) 11/23/2021   COVID-19 virus infection 11/23/2021   ARF (acute renal failure) (HCC) 11/23/2021   Acute respiratory failure with hypoxemia (HCC) 11/23/2021   Dyslipidemia 05/18/2020   Educated about COVID-19 virus infection 05/18/2020   Pulmonary nodule, right 10/01/2019   Prediabetes 05/05/2019   Moderate aortic stenosis 03/19/2018   Abnormal nuclear stress test    Chest pain 03/16/2018   Unstable angina (HCC)    Right adrenal mass (HCC) 11/05/2017   Nonrheumatic aortic valve stenosis 10/11/2017   Coronary artery disease involving native coronary artery of native heart without angina pectoris 10/11/2017   LVH (left ventricular hypertrophy) 10/11/2017   PAF (paroxysmal atrial fibrillation) (HCC) 05/03/2015   OA (osteoarthritis) of hip 08/04/2014   Edema 08/17/2013  H/O bone density study 03/16/2013   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   Hereditary and idiopathic peripheral neuropathy 03/05/2013   Vitamin D deficiency 03/05/2013   Fatty infiltration of liver 02/08/2013   Mixed hyperlipidemia 02/08/2013   Obesity, Class III, BMI 40-49.9 (morbid obesity) (HCC) 02/08/2013   Cellulitis of groin, right 12/30/2012   DJD (degenerative joint disease) 12/17/2012   GERD (gastroesophageal reflux disease)  12/17/2012   Peripheral neuropathy 12/17/2012   HOCM (hypertrophic obstructive cardiomyopathy) (HCC) 10/16/2012   HTN (hypertension) 10/16/2012   Sleep apnea 10/16/2012   Heart palpitations    Home Medication(s) Prior to Admission medications   Medication Sig Start Date End Date Taking? Authorizing Provider  ACETAMINOPHEN PO Take 650 mg by mouth daily as needed for moderate pain or headache.   Yes [provider]  amiodarone (PACERONE) 100 MG tablet Take 1 tablet (100 mg total) by mouth daily. 12/30/23  Yes Almon Hercules, MD  apixaban (ELIQUIS) 5 MG TABS tablet Take 1 tablet (5 mg total) by mouth 2 (two) times daily. 08/14/23  Yes Azalee Course, PA  atropine 1 % ophthalmic solution Place 1 drop into the right eye daily.   Yes [provider]  cholecalciferol (VITAMIN D3) 25 MCG (1000 UNIT) tablet Take 1,000 Units by mouth 2 (two) times daily.   Yes [provider]  Coenzyme Q10 (COQ10) 100 MG CAPS Take 100 mg by mouth daily.   Yes [provider]  CRESTOR 20 MG tablet TAKE 1 TABLET(20 MG) BY MOUTH DAILY 01/30/23  Yes Rollene Rotunda, MD  ezetimibe (ZETIA) 10 MG tablet TAKE 1 TABLET(10 MG) BY MOUTH DAILY 08/20/23  Yes Meng, Avalon, PA  fluticasone-salmeterol (WIXELA INHUB) 100-50 MCG/ACT AEPB Inhale 1 puff into the lungs 2 (two) times daily. 12/24/23  Yes Luciano Cutter, MD  furosemide (LASIX) 40 MG tablet Take 1 tablet (40 mg total) by mouth daily. 06/17/23  Yes Rollene Rotunda, MD  levalbuterol [facility] HFA) 45 MCG/ACT inhaler Inhale 1-2 puffs into the lungs every 6 (six) hours as needed for wheezing. 06/26/22  Yes Omar Person, MD  nitroGLYCERIN (NITROSTAT) 0.4 MG SL tablet Place 1 tablet (0.4 mg total) under the tongue every 5 (five) minutes as needed for chest pain. 05/07/16  Yes Rosalio Macadamia, NP  omeprazole (PRILOSEC) 20 MG capsule Take 20 mg by mouth 2 (two) times daily before a meal.   Yes [provider]  predniSONE (DELTASONE) 10 MG tablet  Take 4 tablets (40 mg total) by mouth daily with breakfast for 2 days, THEN 3 tablets (30 mg total) daily with breakfast for 2 days, THEN 2 tablets (20 mg total) daily with breakfast for 2 days, THEN 1 tablet (10 mg total) daily with breakfast for 2 days. 01/03/24 01/11/24 Yes Luciano Cutter, MD  Propylene Glycol (SYSTANE BALANCE) 0.6 % SOLN Place 1 drop into both eyes 2 (two) times daily as needed (dry eyes).   Yes [provider]  Tiotropium Bromide Monohydrate (SPIRIVA RESPIMAT) 1.25 MCG/ACT AERS Inhale 2 puffs into the lungs daily. 05/30/23  Yes Omar Person, MD  metoprolol succinate (TOPROL-XL) 25 MG 24 hr tablet Take 25 mg by mouth daily. Patient not taking: Reported on 01/05/2024    [provider]  Past Surgical History Past Surgical History:  Procedure Laterality Date   APPENDECTOMY  2004   CARDIOVERSION N/A 01/03/2022   Procedure: CARDIOVERSION;  Surgeon: Sande Rives, MD;  [facility];  Service: Cardiovascular;  Laterality: N/A;   CARDIOVERSION N/A 09/10/2023   Procedure: CARDIOVERSION;  Surgeon: Jodelle Red, MD;  Location: [facility] INVASIVE CV LAB;  Service: Cardiovascular;  Laterality: N/A;   CATARACT EXTRACTION W/ INTRAOCULAR LENS IMPLANT     both eyes   CHOLECYSTECTOMY     LEFT HEART CATH AND CORONARY ANGIOGRAPHY N/A 03/19/2018   Procedure: LEFT HEART CATH AND CORONARY ANGIOGRAPHY;  Surgeon: Kathleene Hazel, MD;  Location: [facility] INVASIVE CV LAB;  Service: Cardiovascular;  Laterality: N/A;   NM MYOCAR PERF WALL MOTION  08/15/04   No ischemia   RETINAL DETACHMENT SURGERY Bilateral    remains with slight hazy vision with lights   RIGHT/LEFT HEART CATH AND CORONARY ANGIOGRAPHY N/A 07/11/2020   Procedure: RIGHT/LEFT HEART CATH AND CORONARY ANGIOGRAPHY;  Surgeon: Tonny Bollman, MD;  Location: [facility] INVASIVE CV  LAB;  Service: Cardiovascular;  Laterality: N/A;   TOTAL HIP ARTHROPLASTY Right 08/04/2014   Procedure: RIGHT TOTAL HIP ARTHROPLASTY ANTERIOR APPROACH;  Surgeon: Loanne Drilling, MD;  Location: [facility] ORS;  Service: Orthopedics;  Laterality: Right;   TOTAL HIP ARTHROPLASTY Left 01/05/2015   Procedure: LEFT TOTAL HIP ARTHROPLASTY ANTERIOR APPROACH;  Surgeon: Loanne Drilling, MD;  Location: [facility] ORS;  Service: Orthopedics;  Laterality: Left;   US ECHOCARDIOGRAPHY  06/25/2012   Moderate ASH,LV hyperdynamic,LA is mod. dilated,trace MR,TR,AI   Family History Family History  Problem Relation Age of Onset   Hypertension Mother    CVA Mother    Emphysema Mother        smoked   Lung cancer Father 51       asbestos exposure   Cancer - Lung Father        smoked and asbestos exp   Emphysema Sister        smoked   Esophageal cancer Other        nephew   Diabetes Other        nephew   Colon cancer Neg Hx    Pancreatic cancer Neg Hx    Kidney disease Neg Hx    Liver disease Neg Hx     Social History Social History   Tobacco Use   Smoking status: Former    Current packs/day: 0.00    Average packs/day: 1 pack/day for 20.0 years (20.0 ttl pk-yrs)    Types: Cigarettes    Start date: 12/03/1968    Quit date: 12/03/1988    Years since quitting: 35.1   Smokeless tobacco: Never  Vaping Use   Vaping status: Never Used  Substance Use Topics   Alcohol use: No    Alcohol/week: 0.0 standard drinks of alcohol   Drug use: No   Allergies Codeine, Kenalog [triamcinolone acetonide], Kenalog [triamcinolone], Pantoprazole, Relafen [nabumetone], Albuterol, Epinephrine, Penicillins, and Tramadol  Review of Systems Review of Systems  Constitutional:  Negative for chills and fever.  Respiratory:  Positive for cough, chest tightness and shortness of breath.   Cardiovascular:  Negative for chest pain, palpitations and leg swelling.  Gastrointestinal:  Negative for abdominal pain, nausea and vomiting.   Genitourinary:  Negative for difficulty urinating and dysuria.  Neurological:  Negative for syncope and headaches.  All other systems reviewed and are negative.   Physical Exam Vital Signs  I have reviewed the triage vital signs BP  135/64 (BP Location: Right Arm)   Pulse 91   Temp (!) 97.5 F (36.4 C) (Oral)   Resp 18   Ht 5\' 8"  (1.727 m)   Wt 127.8 kg   SpO2 93%   BMI 42.84 kg/m  Physical Exam Vitals and nursing note reviewed.  Constitutional:      General: She is not in acute distress.    Appearance: Normal appearance. She is well-developed. She is obese.  HENT:     Head: Normocephalic and atraumatic.     Right Ear: External ear normal.     Left Ear: External ear normal.     Nose: Nose normal.     Mouth/Throat:     Mouth: Mucous membranes are moist.  Eyes:     General: No scleral icterus.       Right eye: No discharge.        Left eye: No discharge.  Cardiovascular:     Rate and Rhythm: Normal rate and regular rhythm.     Pulses: Normal pulses.     Heart sounds: Normal heart sounds.  Pulmonary:     Effort: Pulmonary effort is normal. Tachypnea present. No respiratory distress.     Breath sounds: No stridor. Decreased breath sounds and wheezing present.  Abdominal:     General: Abdomen is flat. There is no distension.     Palpations: Abdomen is soft.     Tenderness: There is no abdominal tenderness.  Musculoskeletal:     Cervical back: No rigidity.     Right lower leg: No edema.     Left lower leg: No edema.  Skin:    General: Skin is warm and dry.     Capillary Refill: Capillary refill takes less than 2 seconds.  Neurological:     Mental Status: She is alert.  Psychiatric:        Mood and Affect: Mood normal.        Behavior: Behavior normal. Behavior is cooperative.     ED Results and Treatments Labs (all labs ordered are listed, but only abnormal results are displayed) Labs Reviewed  BASIC METABOLIC PANEL - Abnormal; Notable for the following  components:      Result Value   Chloride 96 (*)    Glucose, Bld 185 (*)    All other components within normal limits  CBC WITH DIFFERENTIAL/PLATELET - Abnormal; Notable for the following components:   WBC 10.7 (*)    RBC 3.56 (*)    Hemoglobin 10.2 (*)    HCT 32.1 (*)    RDW 19.3 (*)    nRBC 0.7 (*)    Neutro Abs 8.5 (*)    Abs Immature Granulocytes 0.16 (*)    All other components within normal limits  BRAIN NATRIURETIC PEPTIDE - Abnormal; Notable for the following components:   B Natriuretic Peptide 241.7 (*)    All other components within normal limits  HEMOGLOBIN A1C - Abnormal; Notable for the following components:   Hgb A1c MFr Bld 6.6 (*)    All other components within normal limits  CBC - Abnormal; Notable for the following components:   WBC 13.0 (*)    RBC 3.34 (*)    Hemoglobin 9.5 (*)    HCT 30.2 (*)    RDW 19.7 (*)    Platelets 411 (*)    nRBC 0.8 (*)    All other components within normal limits  BASIC METABOLIC PANEL - Abnormal; Notable for the following components:   Glucose, Bld 173 (*)  Calcium 8.4 (*)    All other components within normal limits  I-STAT VENOUS BLOOD GAS, ED - Abnormal; Notable for the following components:   pH, Ven 7.479 (*)    Bicarbonate 33.8 (*)    TCO2 35 (*)    Acid-Base Excess 9.0 (*)    Calcium, Ion 1.11 (*)    HCT 29.0 (*)    Hemoglobin 9.9 (*)    All other components within normal limits  TROPONIN I (HIGH SENSITIVITY) - Abnormal; Notable for the following components:   Troponin I (High Sensitivity) 31 (*)    All other components within normal limits  TROPONIN I (HIGH SENSITIVITY) - Abnormal; Notable for the following components:   Troponin I (High Sensitivity) 30 (*)    All other components within normal limits  RESP PANEL BY RT-PCR (RSV, FLU A&B, COVID)  RVPGX2  PROCALCITONIN                                                                                                                           Radiology CXR  Pertinent labs & imaging results that were available during my care of the patient were reviewed by me and considered in my medical decision making (see MDM for details).  Medications Ordered in ED Medications  apixaban (ELIQUIS) tablet 5 mg (5 mg Oral Given 01/05/24 2128)  cefTRIAXone (ROCEPHIN) 2 g in sodium chloride 0.9 % 100 mL IVPB (0 g Intravenous Stopped 01/05/24 1158)  ezetimibe (ZETIA) tablet 10 mg (10 mg Oral Given 01/05/24 1304)  sodium chloride flush (NS) 0.9 % injection 3 mL (3 mLs Intravenous Given 01/05/24 2127)  atropine 1 % ophthalmic solution 1 drop (0 drops Right Eye Hold 01/05/24 1416)  polyvinyl alcohol (LIQUIFILM TEARS) 1.4 % ophthalmic solution 1 drop (has no administration in time range)  budesonide (PULMICORT) nebulizer solution 0.5 mg (0.5 mg Nebulization Given 01/05/24 2050)  arformoterol (BROVANA) nebulizer solution 15 mcg (15 mcg Nebulization Given 01/05/24 1925)  predniSONE (DELTASONE) tablet 50 mg (has no administration in time range)  doxycycline (VIBRAMYCIN) 100 mg in sodium chloride 0.9 % 250 mL IVPB (0 mg Intravenous Stopped 01/05/24 2349)  ipratropium (ATROVENT) nebulizer solution 0.5 mg (has no administration in time range)  levalbuterol (XOPENEX) nebulizer solution 1.25 mg (has no administration in time range)  levalbuterol (XOPENEX) nebulizer solution 0.63 mg (0.63 mg Nebulization Given 01/05/24 0601)  sodium chloride 0.9 % bolus 500 mL (0 mLs Intravenous Stopped 01/05/24 0651)  predniSONE (DELTASONE) tablet 50 mg (50 mg Oral Given 01/05/24 0557)  ipratropium (ATROVENT) nebulizer solution 0.5 mg (0.5 mg Nebulization Given 01/05/24 0834)  levalbuterol (XOPENEX) nebulizer solution 0.63 mg (0.63 mg Nebulization Given 01/05/24 0833)  magnesium sulfate IVPB 2 g 50 mL (0 g Intravenous Stopped 01/05/24 1016)  iohexol (OMNIPAQUE) 350 MG/ML injection 75 mL (75 mLs Intravenous Contrast Given 01/05/24 0831)  doxycycline (VIBRAMYCIN) 100 mg in sodium chloride 0.9 % 250 mL  IVPB (0 mg Intravenous Stopped 01/05/24 1435)  furosemide (LASIX)  injection 40 mg (40 mg Intravenous Given 01/05/24 1107)                                                                                                                                     Procedures .Critical Care  Performed by: Sloan Leiter, DO Authorized by: Sloan Leiter, DO   Critical care provider statement:    Critical care time (minutes):  40   Critical care time was exclusive of:  Separately billable procedures and treating other patients   Critical care was necessary to treat or prevent imminent or life-threatening deterioration of the following conditions:  Respiratory failure   Critical care was time spent personally by me on the following activities:  Development of treatment plan with patient or surrogate, discussions with consultants, evaluation of patient's response to treatment, examination of patient, ordering and review of laboratory studies, ordering and review of radiographic studies, ordering and performing treatments and interventions, pulse oximetry, re-evaluation of patient's condition, review of old charts and obtaining history from patient or surrogate   (including critical care time)  Medical Decision Making / ED Course    Medical Decision Making:    QUENNA DOEPKE is a 83 y.o. female with past medical history as below, significant for A-fib, GERD, HLD, HTN, OSA on CPAP, CAD who presents to the ED with complaint of dib. The complaint involves an extensive differential diagnosis and also carries with it a high risk of complications and morbidity.  Serious etiology was considered. Ddx includes but is not limited to: In my evaluation of this patient's dyspnea my DDx includes, but is not limited to, pneumonia, pulmonary embolism, pneumothorax, pulmonary edema, metabolic acidosis, asthma, COPD, cardiac cause, anemia, anxiety, etc.    Complete initial physical exam performed, notably the patient was in no  acute distress, breathing comfortably on 5 L nasal cannula.  Trace wheezing noted on auscultation..    Reviewed and confirmed nursing documentation for past medical history, family history, social history.  Vital signs reviewed.    Clinical Course as of 01/06/24 0725  Wynelle Link Jan 05, 2024  0650 She attempted to ambulate, desatted to 83%, increased work of breathing.  Tachypnea.  Unable to ambulate more than few feet with out incurring respiratory distress.  [SG]  [id number] Chest x-ray concern for possible bronchitis. [SG]    Clinical Course User Index [SG] Sloan Leiter, DO    Brief summary: 83 year old female with history as above here with difficulty breathing.  Recently admitted 1/25 with similar complaint.  Feels her symptoms have not changed since that recent admission.  She has been compliant with home medications, no hypoxia noted prior to arrival.  Was placed on nonrebreather for increased work of breathing despite normoxia.  On arrival she is breathing comfortably on 5 L nasal cannula.  Will collect screening labs, give Xopenex as she is unable to take albuterol, also give oral steroids as  she does not want to take IV steroids.   She feels okay at rest, she did trial ambulation and sig increased WOB was noted with hypoxia despite 5LNC. Will get CTPE, give rpt xopenex neb and give atrovent. CTPE ordered and is pending  Handoff to incoming doc pending CT and admission                Additional history obtained: -Additional history obtained from ems -External records from outside source obtained and reviewed including: Chart review including previous notes, labs, imaging, consultation notes including  Recent admission Home meds Prior labs/imaging   Lab Tests: -I ordered, reviewed, and interpreted labs.   The pertinent results include:   Labs Reviewed  BASIC METABOLIC PANEL - Abnormal; Notable for the following components:      Result Value   Chloride 96 (*)    Glucose,  Bld 185 (*)    All other components within normal limits  CBC WITH DIFFERENTIAL/PLATELET - Abnormal; Notable for the following components:   WBC 10.7 (*)    RBC 3.56 (*)    Hemoglobin 10.2 (*)    HCT 32.1 (*)    RDW 19.3 (*)    nRBC 0.7 (*)    Neutro Abs 8.5 (*)    Abs Immature Granulocytes 0.16 (*)    All other components within normal limits  BRAIN NATRIURETIC PEPTIDE - Abnormal; Notable for the following components:   B Natriuretic Peptide 241.7 (*)    All other components within normal limits  HEMOGLOBIN A1C - Abnormal; Notable for the following components:   Hgb A1c MFr Bld 6.6 (*)    All other components within normal limits  CBC - Abnormal; Notable for the following components:   WBC 13.0 (*)    RBC 3.34 (*)    Hemoglobin 9.5 (*)    HCT 30.2 (*)    RDW 19.7 (*)    Platelets 411 (*)    nRBC 0.8 (*)    All other components within normal limits  BASIC METABOLIC PANEL - Abnormal; Notable for the following components:   Glucose, Bld 173 (*)    Calcium 8.4 (*)    All other components within normal limits  I-STAT VENOUS BLOOD GAS, ED - Abnormal; Notable for the following components:   pH, Ven 7.479 (*)    Bicarbonate 33.8 (*)    TCO2 35 (*)    Acid-Base Excess 9.0 (*)    Calcium, Ion 1.11 (*)    HCT 29.0 (*)    Hemoglobin 9.9 (*)    All other components within normal limits  TROPONIN I (HIGH SENSITIVITY) - Abnormal; Notable for the following components:   Troponin I (High Sensitivity) 31 (*)    All other components within normal limits  TROPONIN I (HIGH SENSITIVITY) - Abnormal; Notable for the following components:   Troponin I (High Sensitivity) 30 (*)    All other components within normal limits  RESP PANEL BY RT-PCR (RSV, FLU A&B, COVID)  RVPGX2  PROCALCITONIN    Notable for trop mild elev, bnp mild elev  EKG   EKG Interpretation Date/Time:  Sunday January 05 2024 05:34:08 EST Ventricular Rate:  97 PR Interval:  195 QRS Duration:  93 QT Interval:  392 QTC  Calculation: 498 R Axis:   -85  Text Interpretation: Sinus rhythm Left anterior fascicular block Abnormal R-wave progression, late transition Borderline prolonged QT interval Confirmed by Tanda Rockers ([id number]) on 01/05/2024 6:16:01 AM         Imaging  Studies ordered: I ordered imaging studies including CXR CTPE (CT PENDING) I independently visualized the following imaging with scope of interpretation limited to determining acute life threatening conditions related to emergency care; findings noted above I independently visualized and interpreted imaging. I agree with the radiologist interpretation   Medicines ordered and prescription drug management: Meds ordered this encounter  Medications   levalbuterol (XOPENEX) nebulizer solution 0.63 mg   sodium chloride 0.9 % bolus 500 mL   predniSONE (DELTASONE) tablet 50 mg   ipratropium (ATROVENT) nebulizer solution 0.5 mg   levalbuterol (XOPENEX) nebulizer solution 0.63 mg   magnesium sulfate IVPB 2 g 50 mL   iohexol (OMNIPAQUE) 350 MG/ML injection 75 mL   DISCONTD: cefTRIAXone (ROCEPHIN) 1 g in sodium chloride 0.9 % 100 mL IVPB    Antibiotic Indication::   CAP   doxycycline (VIBRAMYCIN) 100 mg in sodium chloride 0.9 % 250 mL IVPB    Antibiotic Indication::   CAP   furosemide (LASIX) injection 40 mg   apixaban (ELIQUIS) tablet 5 mg   cefTRIAXone (ROCEPHIN) 2 g in sodium chloride 0.9 % 100 mL IVPB    Antibiotic Indication::   CAP   ezetimibe (ZETIA) tablet 10 mg   sodium chloride flush (NS) 0.9 % injection 3 mL   DISCONTD: levalbuterol (XOPENEX) nebulizer solution 1.25 mg   DISCONTD: ipratropium (ATROVENT) nebulizer solution 0.5 mg   atropine 1 % ophthalmic solution 1 drop   polyvinyl alcohol (LIQUIFILM TEARS) 1.4 % ophthalmic solution 1 drop   budesonide (PULMICORT) nebulizer solution 0.5 mg   arformoterol (BROVANA) nebulizer solution 15 mcg   predniSONE (DELTASONE) tablet 50 mg   doxycycline (VIBRAMYCIN) 100 mg in sodium chloride 0.9  % 250 mL IVPB    Antibiotic Indication::   CAP   ipratropium (ATROVENT) nebulizer solution 0.5 mg   levalbuterol (XOPENEX) nebulizer solution 1.25 mg    -I have reviewed the patients home medicines and have made adjustments as needed   Consultations Obtained: na   Cardiac Monitoring: The patient was maintained on a cardiac monitor.  I personally viewed and interpreted the cardiac monitored which showed an underlying rhythm of: nsr Continuous pulse oximetry interpreted by myself, 97% on 5L, desat to 85% w/ ambulation.    Social Determinants of Health:  Diagnosis or treatment significantly limited by social determinants of health: former smoker and obesity   Reevaluation: After the interventions noted above, I reevaluated the patient and found that they have stayed the same  Co morbidities that complicate the patient evaluation  Past Medical History:  Diagnosis Date   Abdominal pain    Arthritis    osteoarthritis-hips,knees, back, hands   Asymmetric septal hypertrophy    Atrial fibrillation (HCC)    CAD (coronary artery disease)    2 drug-eluting stents placed in the circumflex leading into a marginal.  May 2017.  [name].   catheterization on 03/19/2018, this showed patent stent in the proximal to mid left circumflex artery, 20% ostial left circumflex artery disease, 40% OM 3 disease, widely patent stent in the ostial D1, 20% ostial to proximal LAD disease, 20% mid LAD disease.   Cataract    corrective surgery done   Fatty liver    Gallstones    GERD (gastroesophageal reflux disease)    Hepatitis    hepatitis A; past hx.,many yrs ago ? food source   Hernia, hiatal    Hyperlipidemia    Hypertension    Obesity    Sleep apnea    CPAP  Dispostion: Disposition decision including need for hospitalization was considered, and patient disposition pending at time of sign out.    Final Clinical Impression(s) / ED Diagnoses Final diagnoses:  Hypoxia         Sloan Leiter, DO 01/06/24 0725

## 2024-01-05 NOTE — Consult Note (Signed)
Cardiology Consultation   Patient ID: [name] MRN: [id number]; DOB: Jul 04, 1941  Admit date: 01/05/2024 Date of Consult: 01/05/2024  PCP:  Charlane Ferretti, DO   [facility] Providers Cardiologist:  Rollene Rotunda, MD        Patient Profile:   Victoria Delgado is a 83 y.o. 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   History of Present Illness:   Victoria Delgado is a 83 y.o. 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 Over the past several weeks she has had worsening dyspnea and orthopnea. She has had marked limitation of daily activities due to dyspnea on exertion. She has also noted lower extremity edema over the past several days. She is compliant with her medications including her lasix 40 mg daily. She does not take daily weights at home. She reports no chest pain, palpitations, syncope, or falls. She presented to the ED requiring 5-6 L [place]. EKG shows normal sinus rhythm. Troponin 31 - 30. BNP 241. CTA chest showed no PE but did show pulmonary edema with small-moderate effusions. She has been started on antibiotics empirically for presumed pneumonia. She received IV lasix 40 mg x1 but reported to not have put out any urine with this.   Past Medical History:  Diagnosis Date   Abdominal pain    Arthritis    osteoarthritis-hips,knees, back, hands   Asymmetric septal hypertrophy    Atrial fibrillation (HCC)    CAD (coronary artery disease)    2 drug-eluting stents placed in the circumflex leading into a marginal.  May 2017.  [name].   catheterization on 03/19/2018, this showed patent stent in the proximal to mid left circumflex artery, 20% ostial left circumflex artery disease, 40% OM 3 disease, widely patent stent in the ostial  D1, 20% ostial to proximal LAD disease, 20% mid LAD disease.   Cataract    corrective surgery done   Fatty liver    Gallstones    GERD (gastroesophageal reflux disease)    Hepatitis    hepatitis A; past hx.,many yrs ago ? food source   Hernia, hiatal    Hyperlipidemia    Hypertension    Obesity    Sleep apnea    CPAP    Past Surgical History:  Procedure Laterality Date   APPENDECTOMY  2004   CARDIOVERSION N/A 01/03/2022   Procedure: CARDIOVERSION;  Surgeon: Sande Rives, MD;  Location: [facility] ENDOSCOPY;  Service: Cardiovascular;  Laterality: N/A;   CARDIOVERSION N/A 09/10/2023   Procedure: CARDIOVERSION;  Surgeon: Jodelle Red, MD;  Location: [facility] INVASIVE CV LAB;  Service: Cardiovascular;  Laterality: N/A;   CATARACT EXTRACTION W/ INTRAOCULAR LENS IMPLANT     both eyes   CHOLECYSTECTOMY     LEFT HEART CATH AND CORONARY ANGIOGRAPHY N/A 03/19/2018   Procedure: LEFT HEART CATH AND CORONARY ANGIOGRAPHY;  Surgeon: Kathleene Hazel, MD;  Location: [facility] INVASIVE CV LAB;  Service: Cardiovascular;  Laterality: N/A;   NM MYOCAR PERF WALL MOTION  08/15/04   No ischemia   RETINAL DETACHMENT SURGERY Bilateral    remains with slight hazy vision with lights   RIGHT/LEFT HEART CATH AND CORONARY ANGIOGRAPHY N/A 07/11/2020   Procedure:  RIGHT/LEFT HEART CATH AND CORONARY ANGIOGRAPHY;  Surgeon: Tonny Bollman, MD;  Location: [facility] INVASIVE CV LAB;  Service: Cardiovascular;  Laterality: N/A;   TOTAL HIP ARTHROPLASTY Right 08/04/2014   Procedure: RIGHT TOTAL HIP ARTHROPLASTY ANTERIOR APPROACH;  Surgeon: Loanne Drilling, MD;  Location: [facility] ORS;  Service: Orthopedics;  Laterality: Right;   TOTAL HIP ARTHROPLASTY Left 01/05/2015   Procedure: LEFT TOTAL HIP ARTHROPLASTY ANTERIOR APPROACH;  Surgeon: Loanne Drilling, MD;  Location: [facility] ORS;  Service: Orthopedics;  Laterality: Left;   US ECHOCARDIOGRAPHY  06/25/2012   Moderate ASH,LV hyperdynamic,LA is mod. dilated,trace MR,TR,AI     Home  Medications:  Prior to Admission medications   Medication Sig Start Date End Date Taking? Authorizing Provider  ACETAMINOPHEN PO Take 650 mg by mouth daily as needed for moderate pain or headache.   Yes [provider]  amiodarone (PACERONE) 100 MG tablet Take 1 tablet (100 mg total) by mouth daily. 12/30/23  Yes Almon Hercules, MD  apixaban (ELIQUIS) 5 MG TABS tablet Take 1 tablet (5 mg total) by mouth 2 (two) times daily. 08/14/23  Yes Azalee Course, PA  atropine 1 % ophthalmic solution Place 1 drop into the right eye daily.   Yes [provider]  cholecalciferol (VITAMIN D3) 25 MCG (1000 UNIT) tablet Take 1,000 Units by mouth 2 (two) times daily.   Yes [provider]  Coenzyme Q10 (COQ10) 100 MG CAPS Take 100 mg by mouth daily.   Yes [provider]  CRESTOR 20 MG tablet TAKE 1 TABLET(20 MG) BY MOUTH DAILY 01/30/23  Yes Rollene Rotunda, MD  ezetimibe (ZETIA) 10 MG tablet TAKE 1 TABLET(10 MG) BY MOUTH DAILY 08/20/23  Yes Meng, [place], PA  fluticasone-salmeterol (WIXELA INHUB) 100-50 MCG/ACT AEPB Inhale 1 puff into the lungs 2 (two) times daily. 12/24/23  Yes Luciano Cutter, MD  furosemide (LASIX) 40 MG tablet Take 1 tablet (40 mg total) by mouth daily. 06/17/23  Yes Rollene Rotunda, MD  levalbuterol [facility] HFA) 45 MCG/ACT inhaler Inhale 1-2 puffs into the lungs every 6 (six) hours as needed for wheezing. 06/26/22  Yes Omar Person, MD  nitroGLYCERIN (NITROSTAT) 0.4 MG SL tablet Place 1 tablet (0.4 mg total) under the tongue every 5 (five) minutes as needed for chest pain. 05/07/16  Yes Rosalio Macadamia, NP  omeprazole (PRILOSEC) 20 MG capsule Take 20 mg by mouth 2 (two) times daily before a meal.   Yes [provider]  predniSONE (DELTASONE) 10 MG tablet Take 4 tablets (40 mg total) by mouth daily with breakfast for 2 days, THEN 3 tablets (30 mg total) daily with breakfast for 2 days, THEN 2 tablets (20 mg total) daily with breakfast for 2 days, THEN 1 tablet  (10 mg total) daily with breakfast for 2 days. 01/03/24 01/11/24 Yes Luciano Cutter, MD  Propylene Glycol (SYSTANE BALANCE) 0.6 % SOLN Place 1 drop into both eyes 2 (two) times daily as needed (dry eyes).   Yes [provider]  Tiotropium Bromide Monohydrate (SPIRIVA RESPIMAT) 1.25 MCG/ACT AERS Inhale 2 puffs into the lungs daily. 05/30/23  Yes Omar Person, MD  metoprolol succinate (TOPROL-XL) 25 MG 24 hr tablet Take 25 mg by mouth daily. Patient not taking: Reported on 01/05/2024    [provider]    Inpatient Medications: Scheduled Meds:  apixaban  5 mg Oral BID   arformoterol  15 mcg Nebulization BID   atropine  1 drop Right Eye Daily   budesonide (PULMICORT)  nebulizer solution  0.5 mg Nebulization BID   ezetimibe  10 mg Oral Daily   ipratropium  0.5 mg Nebulization Q6H   levalbuterol  1.25 mg Nebulization Q6H   [START ON 01/06/2024] predniSONE  50 mg Oral Q breakfast   sodium chloride flush  3 mL Intravenous Q12H   Continuous Infusions:  cefTRIAXone (ROCEPHIN)  IV Stopped (01/05/24 1158)   doxycycline (VIBRAMYCIN) IV 100 mg (01/05/24 2131)   PRN Meds: polyvinyl alcohol  Allergies:    Allergies  Allergen Reactions   Codeine Nausea Only   Kenalog [Triamcinolone Acetonide] Other (See Comments)    Had A.fib after she received the injection."Steroid meds"   Kenalog [Triamcinolone] Rash    Other reaction(s): AFIB   Pantoprazole Rash    Other reaction(s): chronic upper GI pain all the way to back   Relafen [Nabumetone] Other (See Comments)    Edema    Albuterol Other (See Comments)    Causes SVT   Epinephrine Palpitations   Penicillins Rash    40 years ago, injection site was red and swollen.  rash, facial/tongue/throat swelling, SOB or lightheadedness with hypotension: Yes Has patient had a PCN reaction causing immediate  Has patient had a PCN reaction causing severe rash involving mucus membranes or skin necrosis: NO Has patient had a PCN reaction  that required hospitalization. No  Has patient had a PCN reaction occurring within the last 10 years: No If all of the above answers are "NO", then may proceed with Cephalosporin use.     Tramadol Other (See Comments)    Auditory halllucinations    Social History:   Social History   Socioeconomic History   Marital status: Married    Spouse name: Not on file   Number of children: 2   Years of education: Not on file   Highest education level: Not on file  Occupational History    Employer: OTHER  Tobacco Use   Smoking status: Former    Current packs/day: 0.00    Average packs/day: 1 pack/day for 20.0 years (20.0 ttl pk-yrs)    Types: Cigarettes    Start date: 12/03/1968    Quit date: 12/03/1988    Years since quitting: 35.1   Smokeless tobacco: Never  Vaping Use   Vaping status: Never Used  Substance and Sexual Activity   Alcohol use: No    Alcohol/week: 0.0 standard drinks of alcohol   Drug use: No   Sexual activity: Yes  Other Topics Concern   Not on file  Social History Narrative   Two grandchildren.            Social Drivers of Corporate investment banker Strain: Not on file  Food Insecurity: No Food Insecurity (01/05/2024)   Hunger Vital Sign    Worried About Running Out of Food in the Last Year: Never true    Ran Out of Food in the Last Year: Never true  Transportation Needs: No Transportation Needs (01/05/2024)   PRAPARE - Administrator, Civil Service (Medical): No    Lack of Transportation (Non-Medical): No  Physical Activity: Not on file  Stress: Not on file  Social Connections: Unknown (01/05/2024)   Social Connection and Isolation Panel [NHANES]    Frequency of Communication with Friends and Family: More than three times a week    Frequency of Social Gatherings with Friends and Family: Three times a week    Attends Religious Services: Not on file    Active Member of  Clubs or Organizations: No    Attends Banker Meetings: Never     Marital Status: Married  Catering manager Violence: Not At Risk (01/05/2024)   Humiliation, Afraid, Rape, and Kick questionnaire    Fear of Current or Ex-Partner: No    Emotionally Abused: No    Physically Abused: No    Sexually Abused: No    Family History:    Family History  Problem Relation Age of Onset   Hypertension Mother    CVA Mother    Emphysema Mother        smoked   Lung cancer Father 56       asbestos exposure   Cancer - Lung Father        smoked and asbestos exp   Emphysema Sister        smoked   Esophageal cancer Other        nephew   Diabetes Other        nephew   Colon cancer Neg Hx    Pancreatic cancer Neg Hx    Kidney disease Neg Hx    Liver disease Neg Hx      ROS:  Please see the history of present illness.   All other ROS reviewed and negative.     Physical Exam/Data:   Vitals:   01/05/24 1434 01/05/24 1515 01/05/24 2050 01/05/24 2125  BP: (!) 104/53 110/65  125/62  Pulse: 98 99 94 97  Resp: (!) 23 20 20 18   Temp: (!) 97.5 F (36.4 C) 98.2 F (36.8 C)  (!) 97.4 F (36.3 C)  TempSrc: Oral Oral  Oral  SpO2: 96% 96% 94% 94%  Weight:      Height:        Intake/Output Summary (Last 24 hours) at 01/05/2024 2250 Last data filed at 01/05/2024 6433 Gross per 24 hour  Intake 500 ml  Output --  Net 500 ml      01/05/2024    5:30 AM 12/28/2023   12:11 PM 12/24/2023    1:27 PM  Last 3 Weights  Weight (lbs) 275 lb 9.2 oz 275 lb 276 lb  Weight (kg) 125 kg 124.739 kg 125.193 kg     Body mass index is 41.9 kg/m.  General:  Well nourished, well developed, in no acute distress HEENT: normal Neck: + JVD Vascular: No carotid bruits; Distal pulses 2+ bilaterally Cardiac:  normal S1, S2; RRR; systolic and diastolic murmurs Lungs:  decreased breath sounds bibasilar, no wheezing, +rales  Abd: soft, nontender, no hepatomegaly  Ext: 2+ edema Musculoskeletal:  No deformities Skin: warm and dry  Neuro:  no focal abnormalities noted Psych:  Normal  affect     Laboratory Data:  High Sensitivity Troponin:   Recent Labs  Lab 12/28/23 1536 12/28/23 1752 01/05/24 0540 01/05/24 0851  TROPONINIHS 21* 18* 31* 30*     Chemistry Recent Labs  Lab 01/05/24 0540 01/05/24 0558  NA 136 137  K 3.9 3.9  CL 96*  --   CO2 28  --   GLUCOSE 185*  --   BUN 16  --   CREATININE 0.91  --   CALCIUM 8.9  --   GFRNONAA >60  --   ANIONGAP 12  --     No results for input(s): "PROT", "ALBUMIN", "AST", "ALT", "ALKPHOS", "BILITOT" in the last 168 hours. Lipids No results for input(s): "CHOL", "TRIG", "HDL", "LABVLDL", "LDLCALC", "CHOLHDL" in the last 168 hours.  Hematology Recent Labs  Lab 01/05/24 [phone]  01/05/24 0558  WBC 10.7*  --   RBC 3.56*  --   HGB 10.2* 9.9*  HCT 32.1* 29.0*  MCV 90.2  --   MCH 28.7  --   MCHC 31.8  --   RDW 19.3*  --   PLT 377  --    Thyroid No results for input(s): "TSH", "FREET4" in the last 168 hours.  BNP Recent Labs  Lab 01/05/24 0540  BNP 241.7*    DDimer No results for input(s): "DDIMER" in the last 168 hours.   Radiology/Studies:  CT Angio Chest PE W and/or Wo Contrast Result Date: 01/05/2024 CLINICAL DATA:  Pulmonary embolism (PE) suspected, high prob EXAM: CT ANGIOGRAPHY CHEST WITH CONTRAST TECHNIQUE: Multidetector CT imaging of the chest was performed using the standard protocol during bolus administration of intravenous contrast. Multiplanar CT image reconstructions and MIPs were obtained to evaluate the vascular anatomy. RADIATION DOSE REDUCTION: This exam was performed according to the departmental dose-optimization program which includes automated exposure control, adjustment of the mA and/or kV according to patient size and/or use of iterative reconstruction technique. CONTRAST:  75mL OMNIPAQUE IOHEXOL 350 MG/ML SOLN COMPARISON:  X-ray 01/05/2024, CT 06/19/2022 FINDINGS: Cardiovascular: Satisfactory opacification of the pulmonary arteries to the segmental level. No evidence of pulmonary  embolism. Enlargement of the central pulmonary vasculature. Thoracic aorta is nonaneurysmal. Atherosclerotic calcifications of the aorta and coronary arteries. Prior TAVR. Heart size is mildly enlarged. No pericardial effusion. Mediastinum/Nodes: Multiple enlarged mediastinal lymph nodes including 2.3 cm subcarinal node (series 5, image 75), 1.3 cm lower right paratracheal node (series 5, image 53), and 1.3 cm AP window node (series 5, image 61). These have all increased in size compared to the prior CT. Small bilateral hilar lymph nodes without adenopathy. No enlarged axillary lymph nodes. Stable left thyroid lobe nodule, most recently characterized by ultrasound on 03/28/2022. Trachea and esophagus within normal limits. Lungs/Pleura: Small-moderate right and small left layering pleural effusions. Diffuse ground-glass opacity throughout both lungs with interlobular septal thickening. Dependent bibasilar atelectasis or consolidation. There are a few additional areas of more patchy airspace consolidation throughout both lungs. Background of emphysema. Appearance suggestive of innumerable thin walled pulmonary cysts. No pneumothorax. Upper Abdomen: Stable 2.2 cm right adrenal gland nodule, previously characterized as a benign adenoma and for which no further follow-up imaging is recommended. Musculoskeletal: No acute osseous abnormality. No chest wall abnormality. Review of the MIP images confirms the above findings. IMPRESSION: 1. No evidence of pulmonary embolism. 2. Cardiomegaly with pulmonary edema and small-moderate right and small left layering pleural effusions. 3. Dependent bibasilar atelectasis or consolidation. There are a few additional areas of more patchy airspace consolidation throughout both lungs. A superimposed infectious process is not excluded. 4. Multiple enlarged mediastinal lymph nodes, increased in size compared to the prior CT. These are favored to be reactive. Follow-up chest CT in 3-6 months  could be considered to ensure stability/resolution. 5. Aortic and coronary artery atherosclerosis (ICD10-I70.0). 6. Emphysema (ICD10-J43.9). Electronically Signed   By: Duanne Guess D.O.   On: 01/05/2024 08:52   DG Chest Port 1 View Result Date: 01/05/2024 CLINICAL DATA:  83 year old female with history of shortness of breath. EXAM: PORTABLE CHEST 1 VIEW COMPARISON:  Chest x-ray 12/28/2023. FINDINGS: Lung volumes are low. Bibasilar opacities which may reflect areas of atelectasis and/or consolidation, likely with superimposed small bilateral pleural effusions. Diffuse interstitial prominence and widespread peribronchial cuffing. No pneumothorax. Pulmonary vasculature does not appear engorged. Heart size is mildly enlarged. Upper mediastinal contours are distorted by  patient positioning. Atherosclerotic calcifications are noted in the thoracic aorta. Status post TAVR. IMPRESSION: 1. The appearance of the chest is concerning for probable acute bronchitis. 2. Low lung volumes with bibasilar areas of atelectasis and/or consolidation and superimposed small bilateral pleural effusions. 3. Aortic atherosclerosis. Electronically Signed   By: Trudie Reed M.D.   On: 01/05/2024 06:27     Assessment and Plan:  Victoria Delgado is a 83 y.o. 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  HFpEF  - recommend IV lasix 80 mg bid and adjust for goal net negative 2-3 L per day - obtain iron panel and replete with IV iron if ferritin <100 or <300 and iron sat <20 - recommend addition of spironolactone 25 mg daily - consider addition of SGLT2i  AF - continue amiodarone and apixaban  CAD - continue zetia (previously noted to be intolerant to statins)  HOCM - continue metoprolol     Risk Assessment/Risk Scores:        New York Heart  Association (NYHA) Functional Class NYHA Class III  CHA2DS2-VASc Score =   7  This indicates a  % annual risk of stroke. The patient's score is based upon:          For questions or updates, please contact [facility] Please consult [email or web address] for contact info under    Signed, Lorinda Creed  01/05/2024 10:50 PM

## 2024-01-05 NOTE — ED Notes (Signed)
Patient ambulated poorly; required 2 staff assist; RR rose to the 40's, oxygen desat to 86%, became very weak and stated she didn't think she was going to be able to make it back to the bed. MD Wallace Cullens updated.

## 2024-01-05 NOTE — ED Provider Notes (Signed)
83 yo female presenting with shortness of breath, desaturations with exertion, normally on 2-4L [place], now to 6L [place].  Hx of CHF, A Fib Pt does not tolerate IV steroids but was okay with oral steroids, given prednisone Pt does not want Albuterol which has caused A Fib or SVT or tachycardia issues in the past, but does okay with xopenex   Physical Exam  BP (!) 107/53   Pulse 89   Temp 98.8 F (37.1 C) (Oral)   Resp 20   Ht 5\' 8"  (1.727 m)   Wt 125 kg   SpO2 97%   BMI 41.90 kg/m   Physical Exam  Procedures  Procedures  ED Course / MDM   Clinical Course as of 01/05/24 0857  Sun Jan 05, 2024  0650 She attempted to ambulate, desatted to 83%, increased work of breathing.  Tachypnea.  Unable to ambulate more than few feet with out incurring respiratory distress.  [SG]  [id number] Chest x-ray concern for possible bronchitis. [SG]    Clinical Course User Index [SG] Sloan Leiter, DO   Medical Decision Making Amount and/or Complexity of Data Reviewed Labs: ordered. Radiology: ordered.  Risk Prescription drug management. Decision regarding hospitalization.   CT PE study reviewed.  There is no acute PE but there is note of layering effusions, difficult to exclude superimposed infection.  Per my review of the chart the patient does not appear to have been treated for an infection based on her last hospitalization course, but given her persistent shortness of breath, failure to improve since being hospitalized a week ago, I think it is reasonable at this point to try a course of antibiotics with Rocephin and doxycycline.  She will be admitted for her labored breathing, new oxygen requirement, persistent hypoxia with ambulation. She is stable at this time on 4-5L [place] while at rest, desats with activity.  Low suspicion clinically for sepsis.   Terald Sleeper, MD 01/05/24 [phone]

## 2024-01-05 NOTE — ED Notes (Signed)
 ED Provider at bedside.

## 2024-01-05 NOTE — Progress Notes (Signed)
Attempted Echocardiogram, Patient care in progress, spoke with RN will attempt at a later time.

## 2024-01-06 ENCOUNTER — Inpatient Hospital Stay (HOSPITAL_COMMUNITY): Payer: Medicare Other

## 2024-01-06 DIAGNOSIS — J9621 Acute and chronic respiratory failure with hypoxia: Secondary | ICD-10-CM | POA: Diagnosis not present

## 2024-01-06 DIAGNOSIS — R0602 Shortness of breath: Secondary | ICD-10-CM | POA: Diagnosis not present

## 2024-01-06 LAB — BASIC METABOLIC PANEL
Anion gap: 10 (ref 5–15)
BUN: 16 mg/dL (ref 8–23)
CO2: 29 mmol/L (ref 22–32)
Calcium: 8.4 mg/dL — ABNORMAL LOW (ref 8.9–10.3)
Chloride: 98 mmol/L (ref 98–111)
Creatinine, Ser: 0.79 mg/dL (ref 0.44–1.00)
GFR, Estimated: >60 mL/min (ref >60)
Glucose, Bld: 173 mg/dL — ABNORMAL HIGH (ref 70–99)
Potassium: 3.8 mmol/L (ref 3.5–5.1)
Sodium: 137 mmol/L (ref 135–145)

## 2024-01-06 LAB — CBC
HCT: 30.2 % — ABNORMAL LOW (ref 36.0–46.0)
Hemoglobin: 9.5 g/dL — ABNORMAL LOW (ref 12.0–15.0)
MCH: 28.4 pg (ref 26.0–34.0)
MCHC: 31.5 g/dL (ref 30.0–36.0)
MCV: 90.4 fL (ref 80.0–100.0)
Platelets: 411 10*3/uL — ABNORMAL HIGH (ref 150–400)
RBC: 3.34 MIL/uL — ABNORMAL LOW (ref 3.87–5.11)
RDW: 19.7 % — ABNORMAL HIGH (ref 11.5–15.5)
WBC: 13 10*3/uL — ABNORMAL HIGH (ref 4.0–10.5)
nRBC: 0.8 % — ABNORMAL HIGH (ref 0.0–0.2)

## 2024-01-06 LAB — ECHOCARDIOGRAM COMPLETE
AR max vel: 1.7 cm2
AV Area VTI: 1.68 cm2
AV Area mean vel: 1.57 cm2
AV Mean grad: 19 mm[Hg]
AV Peak grad: 33.4 mm[Hg]
Ao pk vel: 2.89 m/s
Area-P 1/2: 1.83 cm2
Calc EF: 67.7 %
Height: 68 in
MV VTI: 1.26 cm2
S' Lateral: 1.8 cm
Single Plane A2C EF: 71.3 %
Single Plane A4C EF: 69.4 %
Weight: 4507.97 [oz_av]

## 2024-01-06 MED ORDER — OMEPRAZOLE 20 MG PO CPDR
40.0000 mg | DELAYED_RELEASE_CAPSULE | Freq: Every day | ORAL | Status: DC
  Administered 2024-01-06 – 2024-01-12 (×7): 40 mg via ORAL
  Filled 2024-01-06 (×9): qty 2

## 2024-01-06 MED ORDER — LEVALBUTEROL HCL 1.25 MG/0.5ML IN NEBU: 1.2500 mg | INHALATION_SOLUTION | Freq: Four times a day (QID) | RESPIRATORY_TRACT | Status: DC | PRN

## 2024-01-06 MED ORDER — ACETAMINOPHEN 325 MG PO TABS
650.0000 mg | ORAL_TABLET | Freq: Four times a day (QID) | ORAL | Status: DC | PRN
  Administered 2024-01-06 (×2): 650 mg via ORAL
  Filled 2024-01-06: qty 2

## 2024-01-06 MED ORDER — OMEPRAZOLE 2 MG/ML ORAL SUSPENSION: 40.0000 mg | Freq: Every day | ORAL | Status: DC

## 2024-01-06 MED ORDER — IPRATROPIUM BROMIDE 0.02 % IN SOLN: 0.5000 mg | Freq: Four times a day (QID) | RESPIRATORY_TRACT | Status: DC | PRN

## 2024-01-06 MED ORDER — FUROSEMIDE 10 MG/ML IJ SOLN
40.0000 mg | Freq: Two times a day (BID) | INTRAMUSCULAR | Status: DC
  Administered 2024-01-06 – 2024-01-07 (×3): 40 mg via INTRAVENOUS
  Filled 2024-01-06 (×4): qty 4

## 2024-01-06 NOTE — Evaluation (Addendum)
Physical Therapy Evaluation Patient Details Name: Victoria Delgado MRN: [id number] DOB: 12-28-1940 Today's Date: 01/06/2024  History of Present Illness  Patient is 83 y.o. female  presents with complaints of worsening shortness of breath on 01/05/24. Pt  recently hospitalized from 1/25-1/26 with exertional dyspnea.  She had been sent home on 4 L of oxygen at that time. CTA of the chest noted no signs of a PE, cardiomegaly with pulmonary edema with small moderate right and small left layering pleural effusions, and dependent bibasilar atelectasis or consolidation with few new areas throughout both lungs. PMH significant of HT, CAD, PAF chronic anticoagulation, HOCM, severe COPD, OSA/OHS on CPAP, morbid obesity, GERD, RSV infectino complicated by NSTEMI in 2024, AS s/p TAVR.   Clinical Impression  Victoria Delgado is 83 y.o. female admitted with above HPI and diagnosis. Patient is currently limited by functional impairments below (see PT problem list). Patient lives with spouse and reports she had great difficulty mobilizing in home after discharge on 12/29/23. Pt reports once she got home she was limited to recliner<>WC transfers and stand pivot to transfer to toilet from [facility]. Pt states she was unable to amb with RW. Pt reports SOB became more limiting and required spouse to assist with all ADL's. This visit pt performed supine to partial long sit for repositioning and set up assist provided for oral care and extremity testing completed. Pt limited due to increased O2 demand this AM and increased WOB and DOE with minimal bed level activity. Patient will benefit from continued skilled PT interventions to address impairments and progress independence with mobility. Acute PT will follow and progress as able.         If plan is discharge home, recommend the following: A lot of help with walking and/or transfers;A lot of help with bathing/dressing/bathroom;Assistance with cooking/housework;Direct supervision/assist for  medications management;Assist for transportation;Help with stairs or ramp for entrance   Can travel by private vehicle        Equipment Recommendations  (TBD)  Recommendations for Other Services   OT consult;     Functional Status Assessment Patient has had a recent decline in their functional status and demonstrates the ability to make significant improvements in function in a reasonable and predictable amount of time.     Precautions / Restrictions Precautions Precautions: Fall Precaution Comments: 11L/min HFNC watch SpO2 Restrictions Weight Bearing Restrictions Per Provider Order: No      Mobility  Bed Mobility Overal bed mobility: Needs Assistance Bed Mobility: Supine to Sit     Supine to sit: HOB elevated, Used rails     General bed mobility comments: bed features utilized for supine>semi-long sit for repositioning. Eval limited due to dyspnea. pt requersting assist with oral hygiene and set up assist provided. Pt noted to have increased dyspnea noted with self care activity and cues required for pursed lip breathing.    Transfers                        Ambulation/Gait                  Stairs            Wheelchair Mobility     Tilt Bed    Modified Rankin (Stroke Patients Only)       Balance  Pertinent Vitals/Pain Pain Assessment Pain Assessment: No/denies pain (SOB)    Home Living Family/patient expects to be discharged to:: Private residence Living Arrangements: Spouse/significant other Available Help at Discharge: Family Type of Home: House Home Access: Stairs to enter Entrance Stairs-Rails: Right Entrance Stairs-Number of Steps: 3-4   Home Layout: One level Home Equipment: Agricultural consultant (2 wheels);Cane - single point;Grab bars - tub/shower;Electric scooter;Shower seat;Grab bars - toilet;Transport chair (tempurpedic hospital bed) Additional Comments: from  last dc. pt walked into house  sat in recliner and stayed put. husband got transport chair and then to toillet with pivt tx. very limted.    Prior Function Prior Level of Function : Independent/Modified Independent               ADLs Comments: showered 1x on mond after dc last admission     Extremity/Trunk Assessment   Upper Extremity Assessment Upper Extremity Assessment: Defer to OT evaluation    Lower Extremity Assessment Lower Extremity Assessment: RLE deficits/detail;LLE deficits/detail RLE Deficits / Details: proximal weakness greater than distal RLE Sensation: history of peripheral neuropathy RLE Coordination: WNL LLE Deficits / Details: proximal weakness greater than distal LLE Sensation: history of peripheral neuropathy LLE Coordination: WNL    Cervical / Trunk Assessment Cervical / Trunk Assessment: Normal  Communication   Communication Communication: No apparent difficulties (dyspnea limiting)  Cognition Arousal: Alert Behavior During Therapy: WFL for tasks assessed/performed Overall Cognitive Status: Within Functional Limits for tasks assessed                                          General Comments      Exercises Other Exercises Other Exercises: pursed lip breathing.   Assessment/Plan    PT Assessment Patient needs continued PT services  PT Problem List Decreased strength;Decreased activity tolerance;Decreased mobility;Cardiopulmonary status limiting activity;Decreased safety awareness;Decreased knowledge of precautions;Obesity       PT Treatment Interventions DME instruction;Gait training;Stair training;Functional mobility training;Therapeutic activities;Therapeutic exercise;Patient/family education;Balance training    PT Goals (Current goals can be found in the Care Plan section)  Acute Rehab PT Goals Patient Stated Goal: Have supplemental O2 to get home PT Goal Formulation: With patient Time For Goal Achievement:  01/20/24 Potential to Achieve Goals: Good    Frequency Min 1X/week     Co-evaluation               AM-PAC PT "6 Clicks" Mobility  Outcome Measure Help needed turning from your back to your side while in a flat bed without using bedrails?: A Lot Help needed moving from lying on your back to sitting on the side of a flat bed without using bedrails?: A Lot Help needed moving to and from a bed to a chair (including a wheelchair)?: Total Help needed standing up from a chair using your arms (e.g., wheelchair or bedside chair)?: Total Help needed to walk in hospital room?: Total Help needed climbing 3-5 steps with a railing? : Total 6 Click Score: 8    End of Session Equipment Utilized During Treatment: Oxygen (11L/min HFNC) Activity Tolerance: Patient tolerated treatment well;Patient limited by fatigue Patient left: in bed;with call bell/phone within reach;with bed alarm set;with family/visitor present Nurse Communication: Mobility status PT Visit Diagnosis: Unsteadiness on feet (R26.81);Other abnormalities of gait and mobility (R26.89);Difficulty in walking, not elsewhere classified (R26.2);Muscle weakness (generalized) (M62.81)    Time: 4098-1191 PT Time Calculation (min) (ACUTE ONLY): 16  min   Charges:   PT Evaluation $PT Eval Moderate Complexity: 1 Mod   PT General Charges $$ ACUTE PT VISIT: 1 Visit         Wynn Maudlin, DPT Acute Rehabilitation Services Office [phone]  01/06/24 1:14 PM

## 2024-01-06 NOTE — Plan of Care (Signed)
  Problem: Nutrition: Goal: Adequate nutrition will be maintained Outcome: Progressing   Problem: Coping: Goal: Level of anxiety will decrease Outcome: Progressing   Problem: Elimination: Goal: Will not experience complications related to bowel motility Outcome: Progressing   Problem: Safety: Goal: Ability to remain free from injury will improve Outcome: Progressing   

## 2024-01-06 NOTE — Progress Notes (Signed)
Patient along side her family has requested for her code status be change to DNR status. Provider made aware.

## 2024-01-06 NOTE — Progress Notes (Signed)
  Echocardiogram 2D Echocardiogram has been performed.  Sheralyn Boatman R 01/06/2024, 10:14 AM

## 2024-01-06 NOTE — Progress Notes (Signed)
PT Cancellation Note  Patient Details Name: Victoria Delgado MRN: [id number] DOB: [birth year]   Cancelled Treatment:    Reason Eval/Treat Not Completed: Medical issues which prohibited therapy (Per RN pt becoming more dyspneic with minimal movement in bed. O2 requirements increased this AM. Will follow up at later date/time as schedule allows/pt able.)    Renaldo Fiddler PT, DPT Acute Rehabilitation Services Office [phone]  01/06/24 8:44 AM

## 2024-01-06 NOTE — Progress Notes (Addendum)
Progress Note   Patient: Victoria Delgado ZOX:[id number] DOB: [birth year] DOA: 01/05/2024     1 DOS: the patient was seen and examined on 01/06/2024   Brief hospital course: Victoria Delgado is a 83 y.o. female with medical history significant of hypertension, CAD, paroxysmal atrial fibrillation chronic anticoagulation, HOCM, severe COPD, OSA/OHS on CPAP, morbid obesity, and GERD presents with complaints of worsening shortness of breath.  Currently admitted for acute on chronic hypoxic respiratory failure secondary to diastolic CHF exacerbation.  Assessment and Plan: Acute on chronic respiratory failure with hypoxia Suspected diastolic congestive heart failure exacerbation Patient presented with complaints of worsening shortness of breath and orthopnea.  Just recently discharged from the hospital for dyspnea on exertion placed on 4 L of nasal cannula oxygen.  Denies any significant change in weight.  Noted to be hypoxic into the 80s even on 4 L with ambulation.  On physical exam patient noted to have crackles in both lung fields.  CT angiogram of the chest obtained which noted cardiomegaly with pulmonary edema and layering pleural effusions.  BNP was 241.7.  Last echocardiogram noted EF to be 70 to 75% with indeterminate diastolic parameters and moderate left ventricular hypertrophy. -Admit to a telemetry bed -Continuous pulse oximetry with nasal cannula oxygen to maintain O2 saturation greater than -Incentive spirometry and flutter valve -Check echocardiogram pending -Lasix 40 mg IV twice daily -Trend pro BNP -Strict intake output chart, fluid restriction -PT to evaluate and treat     Possible pneumonia Bronchitis CT angiogram of the chest I also noted bilateral opacities with superimposed infection cannot be ruled out.  Patient was started on antibiotics of Rocephin and doxycycline. -Check procalcitonin not elevated -Continue Rocephin and doxycycline.  If condition improves and can de-escalate  antibiotics in 48 hours -Scheduled levalbuterol and ipratropium nebs every 6 hours -Brovana and budesonide nebs twice daily  -Continue prednisone -Mucinex   Elevated troponin Acute on chronic.  High-sensitivity troponin 31-> 30.  Normal troponin slightly higher than previous but still essentially flat.  Suspect this secondary to demand -Continue to monitor   Paroxysmal atrial fibrillation anticoagulation Patient currently in sinus rhythm -Continue Eliquis   Prolonged QT interval On admission QT C noted to be 498.     Essential hypertension Patient's blood pressures had been noted to be soft during last hospitalization for which metoprolol succinate had been recommended to be discontinued.   Diabetes mellitus type 2 with hyperglycemia without long-term use of insulin On admission glucose was noted to be elevated at 185.  Last hemoglobin A1c was noted to be 7.1 when checked 12/07/2022.   Hyperlipidemia -Continue Crestor and Zetia   GERD -Continue pharmacy substitution of Pepcid for omeprazole as patient has allergy to recommended substitution Protonix   OSA  Patient reports that she has not been using her CPAP mask due to feeling [place] though it is suffocating her at night.  She is scheduled to have a sleep study in either March or April of this year   Morbid obesity BMI 41.9 kg 2020     DVT prophylaxis: Eliquis Advance Care Planning:   Code Status: Patient had requested and changed her CODE STATUS to DNR/DNI.  The same has been compliant with.        Subjective: Patient was seen and examined at bedside today.  Patient was undergoing echocardiogram.  Patient reported that she had worsening shortness of breath this morning and had to be increased on her oxygen.  Currently she reports mild improvement in  shortness of breath.  Denies having any chest pain palpitations.  Physical Exam: Vitals:   01/06/24 0503 01/06/24 0812 01/06/24 0813 01/06/24 0818  BP: 135/64   (!) 114/97   Pulse: 91   97  Resp: 18   20  Temp: (!) 97.5 F (36.4 C)   98.3 F (36.8 C)  TempSrc: Oral   Oral  SpO2: 93% 98% 98% 98%  Weight:      Height:       Constitutional: Elderly female who appears to be respiratory distress Eyes: PERRL, lids and conjunctivae normal ENMT: Mucous membranes are moist. Normal dentition.  Neck: normal, supple,  Respiratory: Decreased overall aeration with positive crackles appreciated.  Patient currently on 6 L of nasal cannula oxygen with O2 saturations maintained above 90%.  She is able to speak in shortened sentences. Cardiovascular: Regular rate and rhythm, no murmurs / rubs / gallops.  Trace to 1+ lower extremity edema present. Abdomen: no tenderness, no masses palpated.  Bowel sounds positive.  Musculoskeletal: no clubbing / cyanosis. No joint deformity upper and lower extremities. Good ROM, no contractures. Normal muscle tone.  Skin: no rashes, lesions, ulcers. No induration Neurologic: CN 2-12 grossly intact. Sensation intact, DTR normal. Strength 5/5 in all 4.  Psychiatric: Normal judgment and insight. Alert and oriented x 3. Normal mood.  Data Reviewed:  Labs reviewed mild elevation in WBC count  Family Communication: Patient is alert  Disposition: Status is: Inpatient Remains inpatient appropriate because: Acute hypoxia secondary to CHF exacerbation  Planned Discharge Destination: Home with Home Health    Time spent: 35 minutes  Author: Harold Hedge, MD 01/06/2024 12:23 PM  For on call review [email or web address].

## 2024-01-07 DIAGNOSIS — I480 Paroxysmal atrial fibrillation: Secondary | ICD-10-CM | POA: Diagnosis not present

## 2024-01-07 DIAGNOSIS — I5033 Acute on chronic diastolic (congestive) heart failure: Secondary | ICD-10-CM | POA: Diagnosis not present

## 2024-01-07 DIAGNOSIS — R7989 Other specified abnormal findings of blood chemistry: Secondary | ICD-10-CM | POA: Diagnosis not present

## 2024-01-07 DIAGNOSIS — J9621 Acute and chronic respiratory failure with hypoxia: Secondary | ICD-10-CM | POA: Diagnosis not present

## 2024-01-07 LAB — BLOOD GAS, VENOUS
Acid-Base Excess: 7.2 mmol/L — ABNORMAL HIGH (ref 0.0–2.0)
Acid-Base Excess: 7.6 mmol/L — ABNORMAL HIGH (ref 0.0–2.0)
Bicarbonate: 36.4 mmol/L — ABNORMAL HIGH (ref 20.0–28.0)
Bicarbonate: 33.7 mmol/L — ABNORMAL HIGH (ref 20.0–28.0)
Drawn by: 8306
O2 Saturation: 18.7 %
O2 Saturation: 69.2 %
Patient temperature: 37
Patient temperature: 36.4
pCO2, Ven: 74 mm[Hg] (ref 44–60)
pCO2, Ven: 51 mm[Hg] (ref 44–60)
pH, Ven: 7.3 (ref 7.25–7.43)
pH, Ven: 7.43 (ref 7.25–7.43)
pO2, Ven: <31 mm[Hg] — CL (ref 32–45)
pO2, Ven: 37 mm[Hg] (ref 32–45)

## 2024-01-07 LAB — COMPREHENSIVE METABOLIC PANEL
ALT: 14 U/L (ref 0–44)
AST: 20 U/L (ref 15–41)
Albumin: 2.7 g/dL — ABNORMAL LOW (ref 3.5–5.0)
Alkaline Phosphatase: 68 U/L (ref 38–126)
Anion gap: 10 (ref 5–15)
BUN: 23 mg/dL (ref 8–23)
CO2: 30 mmol/L (ref 22–32)
Calcium: 8.7 mg/dL — ABNORMAL LOW (ref 8.9–10.3)
Chloride: 100 mmol/L (ref 98–111)
Creatinine, Ser: 0.93 mg/dL (ref 0.44–1.00)
GFR, Estimated: >60 mL/min (ref >60)
Glucose, Bld: 188 mg/dL — ABNORMAL HIGH (ref 70–99)
Potassium: 4.5 mmol/L (ref 3.5–5.1)
Sodium: 140 mmol/L (ref 135–145)
Total Bilirubin: 0.5 mg/dL (ref 0.0–1.2)
Total Protein: 5.7 g/dL — ABNORMAL LOW (ref 6.5–8.1)

## 2024-01-07 LAB — CBC WITH DIFFERENTIAL/PLATELET
Abs Immature Granulocytes: 0.16 10*3/uL — ABNORMAL HIGH (ref 0.00–0.07)
Basophils Absolute: 0 10*3/uL (ref 0.0–0.1)
Basophils Relative: 0 %
Eosinophils Absolute: 0.2 10*3/uL (ref 0.0–0.5)
Eosinophils Relative: 2 %
HCT: 27.6 % — ABNORMAL LOW (ref 36.0–46.0)
Hemoglobin: 8.9 g/dL — ABNORMAL LOW (ref 12.0–15.0)
Immature Granulocytes: 1 %
Lymphocytes Relative: 12 %
Lymphs Abs: 1.4 10*3/uL (ref 0.7–4.0)
MCH: 29.6 pg (ref 26.0–34.0)
MCHC: 32.2 g/dL (ref 30.0–36.0)
MCV: 91.7 fL (ref 80.0–100.0)
Monocytes Absolute: 1 10*3/uL (ref 0.1–1.0)
Monocytes Relative: 8 %
Neutro Abs: 9.6 10*3/uL — ABNORMAL HIGH (ref 1.7–7.7)
Neutrophils Relative %: 77 %
Platelets: 417 10*3/uL — ABNORMAL HIGH (ref 150–400)
RBC: 3.01 MIL/uL — ABNORMAL LOW (ref 3.87–5.11)
RDW: 20.3 % — ABNORMAL HIGH (ref 11.5–15.5)
WBC: 12.3 10*3/uL — ABNORMAL HIGH (ref 4.0–10.5)
nRBC: 0.8 % — ABNORMAL HIGH (ref 0.0–0.2)

## 2024-01-07 LAB — BRAIN NATRIURETIC PEPTIDE: B Natriuretic Peptide: 334.8 pg/mL — ABNORMAL HIGH (ref 0.0–100.0)

## 2024-01-07 MED ORDER — AMIODARONE HCL 200 MG PO TABS
100.0000 mg | ORAL_TABLET | Freq: Every day | ORAL | Status: DC
  Administered 2024-01-07 – 2024-01-14 (×8): 100 mg via ORAL
  Filled 2024-01-07 (×8): qty 1

## 2024-01-07 MED ORDER — FUROSEMIDE 10 MG/ML IJ SOLN
80.0000 mg | Freq: Two times a day (BID) | INTRAMUSCULAR | Status: AC
  Administered 2024-01-07 – 2024-01-08 (×3): 80 mg via INTRAVENOUS
  Filled 2024-01-07 (×3): qty 8

## 2024-01-07 MED ORDER — ROSUVASTATIN CALCIUM 20 MG PO TABS
20.0000 mg | ORAL_TABLET | Freq: Every day | ORAL | Status: DC
  Administered 2024-01-07 – 2024-01-14 (×8): 20 mg via ORAL
  Filled 2024-01-07 (×8): qty 1

## 2024-01-07 NOTE — Progress Notes (Signed)
 Patient was placed on BIPAP 10/5 with 5L O2 bled in via nasal mask (what patient wears at home). Patient was placed on BIPAP via Resmed machine due to not being able to tolerate a full face BIPAP mask. Patient is tolerating BIPAP well at this time without any complications.

## 2024-01-07 NOTE — Progress Notes (Signed)
Patient arrived to room 4E09 from 2W.  Assessment complete, VS obtained, and Admission database began.

## 2024-01-07 NOTE — Progress Notes (Signed)
Report called to Mindy,RN on 4 East.  Pt transferring to 4 E bed 9 via bed.

## 2024-01-07 NOTE — Progress Notes (Signed)
  Removed Bipap and replaced with HFNC @5 . Pt tolerated well. Pt resting with call bell within reach.  Will continue to monitor.

## 2024-01-07 NOTE — Progress Notes (Signed)
   Patient Name: Victoria Delgado Date of Encounter: 01/07/2024 [facility] Cardiologist: Lynwood Schilling, MD   Interval Summary  .    Still short of breath.  Mildly improved.  Currently on BiPAP.  Her son from [place]  in room.  Lengthy discussion.  She quickly became short of breath over rapid period of time at home.  Could not breathe even sitting in the chair.  Vital Signs .    Vitals:   01/07/24 0733 01/07/24 0816 01/07/24 0836 01/07/24 0951  BP: (!) 105/55 (!) 105/55 111/60 (!) 111/58  Pulse:  85    Resp:  16    Temp: 97.6 F (36.4 C)     TempSrc: Oral     SpO2:  99%    Weight:      Height:        Intake/Output Summary (Last 24 hours) at 01/07/2024 1002 Last data filed at 01/07/2024 0505 Gross per 24 hour  Intake --  Output 1250 ml  Net -1250 ml      01/07/2024    3:54 AM 01/06/2024    5:00 AM 01/05/2024    5:30 AM  Last 3 Weights  Weight (lbs) 279 lb 8.7 oz 281 lb 12 oz 275 lb 9.2 oz  Weight (kg) 126.8 kg 127.8 kg 125 kg      Telemetry/ECG    SR - Personally Reviewed  Physical Exam .   GEN: No acute distress.  Morbidly obese Neck: No JVD Cardiac: RRR, 2/6 SM, no rubs, or gallops.  Respiratory: wheeze bilaterally. GI: Soft, nontender, non-distended  MS: Chronic lower extremity edema  Assessment & Plan .     83 year old with diastolic HF Severe outflow tract obstruction HOCM Severe mitral stenosis - 19 mmHg mean gradient.  Post TAVR aortic valve. 23 mm at [facility] --Continue to try diuresis with IV lasix , I will increase to 80 mg twice daily.  Resp failure - on nebs/steroids PNA/bronchitis/COPD-on continued abx. Lymph nodes noted Trop 30 Parox AFIB - now in NSR. AMIO 100mg  low dose - monitor labs as outpatient.  Chronic anticoag - Eliquis .  Prolonged QTc 498 - stable Morbid obese  Very high risk for further morbidity mortality.  Had lengthy discussion with she and her son.   There are multiple factors that could lead to her shortness of  breath including chronic lung condition, chronic heart condition, potential medications etc.  Highly complex medical decision making. For questions or updates, please contact [facility] Please consult [email or web address] for contact info under        Signed, Oneil Parchment, MD

## 2024-01-07 NOTE — Progress Notes (Addendum)
  Dr.G. Verdene made aware of critical labs. Md recently assessed patient. Per MD will continue to monitor for now. Pt to have Bipap for 4 hours. Pt resting with call bell within reach.  Will continue to monitor.      Latest Reference Range & Units 01/07/24 07:39  pH, Ven 7.25 - 7.43  7.3  pCO2, Ven 44 - 60 mmHg 74 (HH)  pO2, Ven 32 - 45 mmHg <31 (LL)  Acid-Base Excess 0.0 - 2.0 mmol/L 7.2 (H)  Bicarbonate 20.0 - 28.0 mmol/L 36.4 (H)  O2 Saturation % 18.7  Patient temperature  37.0  (HH): Data is critically high (LL): Data is critically low (H): Data is abnormally high

## 2024-01-07 NOTE — Progress Notes (Signed)
 Attempted to wean patient down from 11 L HFNC.  When patient awake she can tolerate 3L with sats of 95%, however when patient asleep she dropped to 87%.  Increased oxygen to 8L at this time.  Patient also says she wears a CPAP, but her pulmonologist tells her to that she needs another sleep study.  Will notify provider.

## 2024-01-07 NOTE — Progress Notes (Signed)
Patient has been admitted for acute on chronic hypoxic respiratory failure.  Currently on high flow oxygen 11 L. -Changing bed request cardiac telemetry to progressive unit.  Tereasa Coop, MD [facility] 01/07/2024, 4:34 AM

## 2024-01-07 NOTE — Progress Notes (Addendum)
  RN reported that attempted to wean down to high flow oxygen currently on 11 L.  While patient is awake she was tolerating 3 L 95% O2 sat but when patient sleep O2 sat dropped to 87%.  Currently on 8 L [place] oxygen. Patient has been admitted for acute CHF exacerbation.  Currently on IV Lasix  twice daily.  As patient continues to desats starting BiPAP for 4 hours.  Checking VBG. Also plan to continue CPAP at bedtime given patient has history of OSA.

## 2024-01-07 NOTE — Evaluation (Signed)
 Occupational Therapy Evaluation Patient Details Name: Victoria Delgado MRN: [id number] DOB: 1941-04-09 Today's Date: 01/07/2024   History of Present Illness Patient is 83 y.o. female  presents with complaints of worsening shortness of breath on 01/05/24. Pt  recently hospitalized from 1/25-1/26 with exertional dyspnea.  She had been sent home on 4 L of oxygen at that time. CTA of the chest noted no signs of a PE, cardiomegaly with pulmonary edema with small moderate right and small left layering pleural effusions, and dependent bibasilar atelectasis or consolidation with few new areas throughout both lungs. PMH significant of HT, CAD, PAF chronic anticoagulation, HOCM, severe COPD, OSA/OHS on CPAP, morbid obesity, GERD, RSV infectino complicated by NSTEMI in 2024, AS s/p TAVR.   Clinical Impression   Pt reports having assist with ADLs and has been using a  transport chair for mobility since last hospital admission (~1 week ago), however before that pt was ind with ADL/mobility, lives with spouse who has been assisting her recently. Pt currently needing up to max A for ADLs, CGA for bed mobility and CGA for transfers with RW. Pt able to transfer to [facility] and to chair, SpO2 down to 88% at lowest on 5-6L O2. Pt educated on PLB, and cued throughout session. Pt presenting with impairments listed below, will follow acutely. Recommend HHOT at d/c.       If plan is discharge home, recommend the following: A little help with walking and/or transfers;A lot of help with bathing/dressing/bathroom;Assistance with cooking/housework;Assist for transportation;Help with stairs or ramp for entrance    Functional Status Assessment  Patient has had a recent decline in their functional status and demonstrates the ability to make significant improvements in function in a reasonable and predictable amount of time.  Equipment Recommendations  None recommended by OT (pt has all needed DME)    Recommendations for Other Services  PT consult     Precautions / Restrictions Precautions Precautions: Fall Precaution Comments: 5L HFNC Restrictions Weight Bearing Restrictions Per Provider Order: No      Mobility Bed Mobility Overal bed mobility: Needs Assistance Bed Mobility: Supine to Sit     Supine to sit: HOB elevated, Used rails, Contact guard          Transfers Overall transfer level: Needs assistance Equipment used: Rolling walker (2 wheels) Transfers: Sit to/from Stand Sit to Stand: Contact guard assist                  Balance Overall balance assessment: Needs assistance Sitting-balance support: Feet supported Sitting balance-Leahy Scale: Good     Standing balance support: During functional activity, Reliant on assistive device for balance Standing balance-Leahy Scale: Fair Standing balance comment: static stands without AD                           ADL either performed or assessed with clinical judgement   ADL Overall ADL's : Needs assistance/impaired Eating/Feeding: Set up;Sitting Eating/Feeding Details (indicate cue type and reason): drinking from cup Grooming: Set up;Sitting Grooming Details (indicate cue type and reason): using hand sanitizer seated in chair Upper Body Bathing: Moderate assistance;Sitting   Lower Body Bathing: Maximal assistance;Sitting/lateral leans   Upper Body Dressing : Moderate assistance;Sitting   Lower Body Dressing: Maximal assistance;Sitting/lateral leans Lower Body Dressing Details (indicate cue type and reason): to don socks Toilet Transfer: Contact guard assist;Ambulation;BSC/3in1 Toilet Transfer Details (indicate cue type and reason): ~25ft to [facility] Toileting- Clothing Manipulation and Hygiene: Maximal assistance  Toileting - Clothing Manipulation Details (indicate cue type and reason): max A for pericare, pt used bidet at home     Functional mobility during ADLs: Contact guard assist;Rolling walker (2 wheels)       Vision    Additional Comments: reports having cataract surgery, but R cataract lens does not stay in place and vision gets like it's looking through water when this happens     Perception Perception: Not tested       Praxis Praxis: Not tested       Pertinent Vitals/Pain Pain Assessment Pain Assessment: No/denies pain     Extremity/Trunk Assessment Upper Extremity Assessment Upper Extremity Assessment: Generalized weakness (reports LUE pain at IV site and to upper arm ROM WFL)   Lower Extremity Assessment Lower Extremity Assessment: Defer to PT evaluation   Cervical / Trunk Assessment Cervical / Trunk Assessment: Normal   Communication Communication Communication: No apparent difficulties   Cognition Arousal: Alert Behavior During Therapy: WFL for tasks assessed/performed Overall Cognitive Status: Within Functional Limits for tasks assessed                                       General Comments  VSS on 5-6L O2    Exercises     Shoulder Instructions      Home Living Family/patient expects to be discharged to:: Private residence Living Arrangements: Spouse/significant other Available Help at Discharge: Family;Available 24 hours/day Type of Home: House Home Access: Stairs to enter [employer] of Steps: 3-4 Entrance Stairs-Rails: Right Home Layout: One level     Bathroom Shower/Tub: Producer, Television/film/video: Handicapped height (with a bidet) Bathroom Accessibility: Yes   Home Equipment: Agricultural Consultant (2 wheels);Cane - single point;Grab bars - tub/shower;Electric scooter;Grab bars - toilet;Transport chair;Shower seat - built in   Additional Comments: has been on 4L O2 since last admission      Prior Functioning/Environment               Mobility Comments: ind without AD ADLs Comments: ind until last admission, has had help since then        OT Problem List: Decreased activity tolerance;Cardiopulmonary status limiting  activity;Impaired balance (sitting and/or standing)      OT Treatment/Interventions: Self-care/ADL training;Therapeutic exercise;Energy conservation;DME and/or AE instruction;Therapeutic activities;Patient/family education;Balance training    OT Goals(Current goals can be found in the care plan section) Acute Rehab OT Goals Patient Stated Goal: to get better and be more independent OT Goal Formulation: With patient Time For Goal Achievement: 01/21/24 Potential to Achieve Goals: Good  OT Frequency: Min 1X/week    Co-evaluation              AM-PAC OT 6 Clicks Daily Activity     Outcome Measure Help from another person eating meals?: None Help from another person taking care of personal grooming?: None Help from another person toileting, which includes using toliet, bedpan, or urinal?: A Lot Help from another person bathing (including washing, rinsing, drying)?: A Lot Help from another person to put on and taking off regular upper body clothing?: A Little Help from another person to put on and taking off regular lower body clothing?: A Lot 6 Click Score: 17   End of Session Equipment Utilized During Treatment: Oxygen;Rolling walker (2 wheels) (5-6L) Nurse Communication: Mobility status  Activity Tolerance: Patient tolerated treatment well Patient left: with call bell/phone within reach;in chair;with family/visitor  present  OT Visit Diagnosis: Other abnormalities of gait and mobility (R26.89);Muscle weakness (generalized) (M62.81)                Time: 8479-8395 OT Time Calculation (min): 44 min Charges:  OT General Charges $OT Visit: 1 Visit OT Evaluation $OT Eval Moderate Complexity: 1 Mod OT Treatments $Self Care/Home Management : 23-37 mins  Janeese Mcgloin K, OTD, OTR/L [employer] Preferred Acute Rehab [phone]   Laneta POUR Koonce 01/07/2024, 4:27 PM

## 2024-01-07 NOTE — Progress Notes (Signed)
 OT Cancellation Note  Patient Details Name: Victoria Delgado MRN: [id number] DOB: August 13, 1941   Cancelled Treatment:    Reason Eval/Treat Not Completed: Pain limiting ability to participate.  Attempted to see patient X 3 today.  First attempt patient on Bipap and asked therapist to come back after 12:00.  Therapist came back just after 12:00 and pt reported she couldn't do it then, she had an accident and needed to get cleaned up, so therapist told her he would come back.  On third attempt, pt declined getting up because she had the Purewick in.  I told her I could remove it so she could get up and she stated  No you won't! I told her I could get somebody, anybody to take it out, we just needed to see what she could do.  Pt did not like response from therapist and requested him to leave and to get another therapist to come in and see her.  Will have another therapist check in and proceed with eval, when able.    Wayde Gopaul OTR/L 01/07/2024, 2:13 PM

## 2024-01-07 NOTE — Progress Notes (Addendum)
 [facility] PROGRESS NOTE   Victoria Delgado FMW:[id number] DOB: 09-16-1941 DOA: 01/05/2024  PCP: Valentin Skates, DO  Brief History: 83 y.o. female with medical history significant of hypertension, CAD, paroxysmal atrial fibrillation chronic anticoagulation, HOCM, severe COPD, OSA/OHS on CPAP, morbid obesity, and GERD presents with complaints of worsening shortness of breath.  Currently admitted for acute on chronic hypoxic respiratory failure secondary to diastolic CHF exacerbation.   Consultants: Cardiology  Procedures: Echocardiogram    Subjective/Interval History: Patient continues to have shortness of breath but feels slightly better from yesterday. Has cough with slightly yellowish sputum. No blood in the sputum. No chest pain. No nausea vomiting.     Assessment/Plan:  Acute on chronic respiratory failure with hypoxia and hypercapnia Suspected diastolic congestive heart failure exacerbation Bilateral pleural effusions Patient presented with complaints of worsening shortness of breath and orthopnea.   Just recently discharged from the hospital for dyspnea on exertion placed on 4 L of nasal cannula oxygen.  Denies any significant change in weight.  Noted to be hypoxic into the 80s even on 4 L with ambulation.   CT angiogram of the chest obtained which noted cardiomegaly with pulmonary edema and layering pleural effusions.  BNP was 241.7.   Echocardiogram was done which showed EF of 70 to 75%.  Diastolic function could not be evaluated.  Right ventricular systolic function was normal.  Elevated pulmonary artery systolic pressure was noted. Patient was placed on IV diuretics.  She did diurese and yesterday.  Check daily weights.  Renal function and electrolytes are stable.  She was seen by cardiology at admission.  Increased dose of furosemide .  Monitor blood pressures closely. Venous blood gas showed hypercapnia.  Clinically patient appears to be stable.  BiPAP was ordered by  the night provider.   Possible pneumonia Bronchitis CT angiogram of the chest I also noted bilateral opacities with superimposed infection cannot be ruled out.  Patient was started on antibiotics of Rocephin  and doxycycline . For now we will continue with antibiotics.  Nebulizer treatments.  Also started on steroids. -Mucinex    Elevated troponin Acute on chronic.  High-sensitivity troponin 31-> 30.  Normal troponin slightly higher than previous but still essentially flat.  Suspect this secondary to demand  Normocytic anemia Hemoglobin was 14 back in October.  Her current hemoglobin is definitely lower than her baseline.  No overt bleeding has been mentioned.  Check FOBT.  Check anemia panel.  Noted to be on anticoagulation.  No clear indication to hold it at this time since there is no active bleeding.  Considering her tenuous respiratory status she is currently not stable enough for urgent endoscopic evaluation.   Paroxysmal atrial fibrillation anticoagulation Patient currently in sinus rhythm -Continue Eliquis   Enlarged mediastinal lymph nodes Incidentally noted on CT angiogram.  Will need reevaluation in a few weeks.  Enlargement of lymph nodes likely reactive.  Prolonged QT interval On admission QT C noted to be 498.     Essential hypertension Patient's blood pressures had been noted to be soft during last hospitalization for which metoprolol  succinate had been recommended to be discontinued.  Blood pressure is stable.  Not on any blood pressure lowering agents apart from the diuretics.   Diabetes mellitus type 2 with hyperglycemia without long-term use of insulin  On admission glucose was noted to be elevated at 185.  Last hemoglobin A1c was noted to be 7.1 when checked 12/07/2022.   Hyperlipidemia -Continue Crestor  and Zetia    GERD -Continue pharmacy substitution of Pepcid   for omeprazole  as patient has allergy to recommended substitution Protonix    OSA  Has not been using her  CPAP.   Morbid obesity BMI 41.9 kg 2020     DVT prophylaxis: Eliquis  CODE STATUS: Initially wanted to be DNR but now wants to be full code. Family communication: Discussed with patient.  No family at bedside. Disposition: Hopefully return home when improved   Status is: Inpatient Remains inpatient appropriate because: Acute CHF, bronchitis      Medications: Scheduled:  apixaban   5 mg Oral BID   arformoterol   15 mcg Nebulization BID   atropine   1 drop Right Eye Daily   budesonide  (PULMICORT ) nebulizer solution  0.5 mg Nebulization BID   ezetimibe   10 mg Oral Daily   furosemide   40 mg Intravenous BID   omeprazole   40 mg Oral Daily   predniSONE   50 mg Oral Q breakfast   sodium chloride  flush  3 mL Intravenous Q12H   Continuous:  cefTRIAXone  (ROCEPHIN )  IV 2 g (01/06/24 1349)   doxycycline  (VIBRAMYCIN ) IV Stopped (01/07/24 0012)   PRN:acetaminophen , ipratropium, levalbuterol , polyvinyl alcohol   Antibiotics: Anti-infectives (From admission, onward)    Start     Dose/Rate Route Frequency Ordered Stop   01/05/24 2200  doxycycline  (VIBRAMYCIN ) 100 mg in sodium chloride  0.9 % 250 mL IVPB        100 mg 125 mL/hr over 120 Minutes Intravenous Every 12 hours 01/05/24 1100     01/05/24 1115  cefTRIAXone  (ROCEPHIN ) 2 g in sodium chloride  0.9 % 100 mL IVPB        2 g 200 mL/hr over 30 Minutes Intravenous Every 24 hours 01/05/24 1034     01/05/24 0900  cefTRIAXone  (ROCEPHIN ) 1 g in sodium chloride  0.9 % 100 mL IVPB  Status:  Discontinued        1 g 200 mL/hr over 30 Minutes Intravenous  Once 01/05/24 0856 01/05/24 1034   01/05/24 0900  doxycycline  (VIBRAMYCIN ) 100 mg in sodium chloride  0.9 % 250 mL IVPB        100 mg 125 mL/hr over 120 Minutes Intravenous  Once 01/05/24 0856 01/05/24 1435       Objective:  Vital Signs  Vitals:   01/07/24 0713 01/07/24 0733 01/07/24 0816 01/07/24 0836  BP:  (!) 105/55 (!) 105/55 111/60  Pulse:   85   Resp:   16   Temp:  97.6 F (36.4 C)     TempSrc:  Oral    SpO2: 96%  99%   Weight:      Height:        Intake/Output Summary (Last 24 hours) at 01/07/2024 0946 Last data filed at 01/07/2024 0505 Gross per 24 hour  Intake --  Output 1250 ml  Net -1250 ml   Filed Weights   01/05/24 0530 01/06/24 0500 01/07/24 0354  Weight: 125 kg 127.8 kg 126.8 kg    General appearance: Awake alert.  In no distress Resp: Diminished air entry at the bases.  Few crackles at the bases bilaterally.  No wheezing or rhonchi. Cardio: S1-S2 is normal regular.  No S3-S4.  No rubs murmurs or bruit GI: Abdomen is soft.  Nontender nondistended.  Bowel sounds are present normal.  No masses organomegaly Extremities: Edema bilateral lower extremities. Neurologic: Alert and oriented x3.  No focal neurological deficits.    Lab Results:  Data Reviewed: I have personally reviewed following labs and reports of the imaging studies  CBC: Recent Labs  Lab 01/05/24 0540 01/05/24  9441 01/06/24 0644 01/07/24 0739  WBC 10.7*  --  13.0* 12.3*  NEUTROABS 8.5*  --   --  9.6*  HGB 10.2* 9.9* 9.5* 8.9*  HCT 32.1* 29.0* 30.2* 27.6*  MCV 90.2  --  90.4 91.7  PLT 377  --  411* 417*    Basic Metabolic Panel: Recent Labs  Lab 01/05/24 0540 01/05/24 0558 01/06/24 0644 01/07/24 0739  NA 136 137 137 140  K 3.9 3.9 3.8 4.5  CL 96*  --  98 100  CO2 28  --  29 30  GLUCOSE 185*  --  173* 188*  BUN 16  --  16 23  CREATININE 0.91  --  0.79 0.93  CALCIUM  8.9  --  8.4* 8.7*    GFR: Estimated Creatinine Clearance: 67.9 mL/min (by C-G formula based on SCr of 0.93 mg/dL).  Liver Function Tests: Recent Labs  Lab 01/07/24 0739  AST 20  ALT 14  ALKPHOS 68  BILITOT 0.5  PROT 5.7*  ALBUMIN 2.7*     BNP (last 3 results) Recent Labs    01/15/23 1343  PROBNP 302.0*    HbA1C: Recent Labs    01/05/24 1721  HGBA1C 6.6*     Recent Results (from the past 240 hours)  Resp panel by RT-PCR (RSV, Flu A&B, Covid) Anterior Nasal Swab     Status: None    Collection Time: 12/28/23  2:04 PM   Specimen: Anterior Nasal Swab  Result Value Ref Range Status   SARS Coronavirus 2 by RT PCR NEGATIVE NEGATIVE Final   Influenza A by PCR NEGATIVE NEGATIVE Final   Influenza B by PCR NEGATIVE NEGATIVE Final    Comment: (NOTE) The Xpert Xpress SARS-CoV-2/FLU/RSV plus assay is intended as an aid in the diagnosis of influenza from Nasopharyngeal swab specimens and should not be used as a sole basis for treatment. Nasal washings and aspirates are unacceptable for Xpert Xpress SARS-CoV-2/FLU/RSV testing.  Fact Sheet for Patients: bloggercourse.com  Fact Sheet for Healthcare Providers: seriousbroker.it  This test is not yet approved or cleared by the [place]  FDA and has been authorized for detection and/or diagnosis of SARS-CoV-2 by FDA under an Emergency Use Authorization (EUA). This EUA will remain in effect (meaning this test can be used) for the duration of the COVID-19 declaration under Section 564(b)(1) of the Act, 21 U.S.C. section 360bbb-3(b)(1), unless the authorization is terminated or revoked.     Resp Syncytial Virus by PCR NEGATIVE NEGATIVE Final    Comment: (NOTE) Fact Sheet for Patients: bloggercourse.com  Fact Sheet for Healthcare Providers: seriousbroker.it  This test is not yet approved or cleared by the [place]  FDA and has been authorized for detection and/or diagnosis of SARS-CoV-2 by FDA under an Emergency Use Authorization (EUA). This EUA will remain in effect (meaning this test can be used) for the duration of the COVID-19 declaration under Section 564(b)(1) of the Act, 21 U.S.C. section 360bbb-3(b)(1), unless the authorization is terminated or revoked.  Performed at [facility], [address] [place], [place] [address]   Resp panel by RT-PCR (RSV, Flu A&B, Covid) Anterior Nasal Swab     Status:  None   Collection Time: 01/05/24  5:40 AM   Specimen: Anterior Nasal Swab  Result Value Ref Range Status   SARS Coronavirus 2 by RT PCR NEGATIVE NEGATIVE Final   Influenza A by PCR NEGATIVE NEGATIVE Final   Influenza B by PCR NEGATIVE NEGATIVE Final    Comment: (NOTE) The  Xpert Xpress SARS-CoV-2/FLU/RSV plus assay is intended as an aid in the diagnosis of influenza from Nasopharyngeal swab specimens and should not be used as a sole basis for treatment. Nasal washings and aspirates are unacceptable for Xpert Xpress SARS-CoV-2/FLU/RSV testing.  Fact Sheet for Patients: bloggercourse.com  Fact Sheet for Healthcare Providers: seriousbroker.it  This test is not yet approved or cleared by the [place]  FDA and has been authorized for detection and/or diagnosis of SARS-CoV-2 by FDA under an Emergency Use Authorization (EUA). This EUA will remain in effect (meaning this test can be used) for the duration of the COVID-19 declaration under Section 564(b)(1) of the Act, 21 U.S.C. section 360bbb-3(b)(1), unless the authorization is terminated or revoked.     Resp Syncytial Virus by PCR NEGATIVE NEGATIVE Final    Comment: (NOTE) Fact Sheet for Patients: bloggercourse.com  Fact Sheet for Healthcare Providers: seriousbroker.it  This test is not yet approved or cleared by the [place]  FDA and has been authorized for detection and/or diagnosis of SARS-CoV-2 by FDA under an Emergency Use Authorization (EUA). This EUA will remain in effect (meaning this test can be used) for the duration of the COVID-19 declaration under Section 564(b)(1) of the Act, 21 U.S.C. section 360bbb-3(b)(1), unless the authorization is terminated or revoked.  Performed at [facility], [address] [place], [place] [address]       Radiology Studies: ECHOCARDIOGRAM COMPLETE Result Date:  01/06/2024    ECHOCARDIOGRAM REPORT   Patient Name:   Victoria Delgado Date of Exam: 01/06/2024 Medical Rec #:  [id number]     Height:       68.0 in Accession #:    [id number]    Weight:       281.7 lb Date of Birth:  10/21/1941    BSA:          2.364 m Patient Age:    82 years      BP:           114/97 mmHg Patient Gender: F             HR:           92 bpm. Exam Location:  Inpatient Procedure: 2D Echo, Cardiac Doppler and Color Doppler Indications:    R06.02 SOB  History:        Patient has prior history of Echocardiogram examinations, most                 recent 12/06/2022. Hypertrophic Cardiomyopathy and CHF, Abnormal                 ECG, Mitral Valve Disease and Aortic Valve Disease,                 Arrythmias:SVT and Atrial Fibrillation, Signs/Symptoms:Dyspnea,                 Shortness of Breath and Chest Pain; Risk Factors:Hypertension,                 Sleep Apnea, Dyslipidemia and Diabetes. Aortic stenosis. Severe                 mitral stenosis. TAVR.                 Aortic Valve: 23 mm Edwards Sapien prosthetic, stented (TAVR)                 valve is present in the aortic position. Procedure Date:  05/14/2022.  Sonographer:    Ellouise Mose RDCS Referring Phys: [id number] RONDELL A SMITH  Sonographer Comments: Patient is obese. Image acquisition challenging due to patient body habitus. Extremely difficult due to habitus and supine position. Did not attempt to turn. Patient stats dropped to 91 when talking. IMPRESSIONS  1. Left ventricular ejection fraction, by estimation, is 70 to 75%. The left ventricle has hyperdynamic function. The left ventricle has no regional wall motion abnormalities. There is moderate concentric left ventricular hypertrophy. Left ventricular diastolic function could not be evaluated.  2. Right ventricular systolic function is normal. The right ventricular size is not well visualized. There is severely elevated pulmonary artery systolic pressure.  3. Left atrial size was moderately  dilated.  4. Large pleural effusion in both left and right lateral regions.  5. The mitral valve is abnormal. Trivial mitral valve regurgitation. Severe mitral stenosis. The mean mitral valve gradient is 19.0 mmHg. Severe mitral annular calcification.  6. The aortic valve has been repaired/replaced. Aortic valve regurgitation is not visualized. 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 Procedure Date: 05/14/2022. Aortic valve mean gradient measures 19.0 mmHg.  7. The inferior vena cava is normal in size with greater than 50% respiratory variability, suggesting right atrial pressure of 3 mmHg. Comparison(s): Prior images reviewed side by side. Changes from prior study are noted. Severely elevated LVOT gradient--images are difficult, but calcified mitral valve may impinge on LVOT. FINDINGS  Left Ventricle: LVOT gradient at rest 152 mmHg. Left ventricular ejection fraction, by estimation, is 70 to 75%. The left ventricle has hyperdynamic function. The left ventricle has no regional wall motion abnormalities. The left ventricular internal cavity size was normal in size. There is moderate concentric left ventricular hypertrophy. Left ventricular diastolic function could not be evaluated due to mitral annular calcification (moderate or greater). Left ventricular diastolic function could not  be evaluated. Right Ventricle: The right ventricular size is not well visualized. Right vetricular wall thickness was not well visualized. Right ventricular systolic function is normal. There is severely elevated pulmonary artery systolic pressure. The tricuspid regurgitant velocity is 4.37 m/s, and with an assumed right atrial pressure of 3 mmHg, the estimated right ventricular systolic pressure is 79.4 mmHg. Left Atrium: Left atrial size was moderately dilated. Right Atrium: Right atrial size was normal in size. Pericardium: Trivial pericardial effusion is present. Presence of epicardial  fat layer. Mitral Valve: The mitral valve is abnormal. There is moderate thickening of the mitral valve leaflet(s). There is moderate calcification of the mitral valve leaflet(s). Severe mitral annular calcification. Trivial mitral valve regurgitation. Severe mitral valve stenosis. MV peak gradient, 27.7 mmHg. The mean mitral valve gradient is 19.0 mmHg. Tricuspid Valve: The tricuspid valve is grossly normal. Tricuspid valve regurgitation is mild . No evidence of tricuspid stenosis. Aortic Valve: The aortic valve has been repaired/replaced. Aortic valve regurgitation is not visualized. Aortic valve mean gradient measures 19.0 mmHg. Aortic valve peak gradient measures 33.4 mmHg. Aortic valve area, by VTI measures 1.68 cm. 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 Procedure Date: 05/14/2022. Pulmonic Valve: The pulmonic valve was not well visualized. Pulmonic valve regurgitation is trivial. No evidence of pulmonic stenosis. Aorta: The aortic root and ascending aorta are structurally normal, with no evidence of dilitation. Venous: The inferior vena cava is normal in size with greater than 50% respiratory variability, suggesting right atrial pressure of 3 mmHg. IAS/Shunts: The interatrial  septum was not well visualized. Additional Comments: There is a large pleural effusion in both left and right lateral regions.  LEFT VENTRICLE PLAX 2D LVIDd:         3.60 cm     Diastology LVIDs:         1.80 cm     LV e' medial:    6.96 cm/s LV PW:         1.70 cm     LV E/e' medial:  29.5 LV IVS:        1.80 cm     LV e' lateral:   7.07 cm/s LVOT diam:     2.20 cm     LV E/e' lateral: 29.0 LV SV:         95 LV SV Index:   40 LVOT Area:     3.80 cm  LV Volumes (MOD) LV vol d, MOD A2C: 71.2 ml LV vol d, MOD A4C: 82.4 ml LV vol s, MOD A2C: 20.4 ml LV vol s, MOD A4C: 25.2 ml LV SV MOD A2C:     50.8 ml LV SV MOD A4C:     82.4 ml LV SV MOD BP:      55.3 ml RIGHT VENTRICLE              IVC RV S prime:     15.80 cm/s  IVC diam: 1.80 cm TAPSE (M-mode): 1.8 cm LEFT ATRIUM             Index        RIGHT ATRIUM           Index LA diam:        5.70 cm 2.41 cm/m   RA Area:     17.40 cm LA Vol (A2C):   37.0 ml 15.65 ml/m  RA Volume:   41.50 ml  17.55 ml/m LA Vol (A4C):   99.3 ml 42.00 ml/m LA Biplane Vol: 61.5 ml 26.01 ml/m  AORTIC VALVE AV Area (Vmax):    1.70 cm AV Area (Vmean):   1.57 cm AV Area (VTI):     1.68 cm AV Vmax:           289.00 cm/s AV Vmean:          197.500 cm/s AV VTI:            0.568 m AV Peak Grad:      33.4 mmHg AV Mean Grad:      19.0 mmHg LVOT Vmax:         129.00 cm/s LVOT Vmean:        81.600 cm/s LVOT VTI:          0.251 m LVOT/AV VTI ratio: 0.44  AORTA Ao Root diam: 3.30 cm Ao Asc diam:  3.70 cm MITRAL VALVE                TRICUSPID VALVE MV Area (PHT): 1.83 cm     TR Peak grad:   76.4 mmHg MV Area VTI:   1.26 cm     TR Vmax:        437.00 cm/s MV Peak grad:  27.7 mmHg MV Mean grad:  19.0 mmHg    SHUNTS MV Vmax:       2.63 m/s     Systemic VTI:  0.25 m MV Vmean:      199.3 cm/s   Systemic Diam: 2.20 cm MV Decel Time: 415 msec MV E velocity: 205.00 cm/s MV A velocity:  210.00 cm/s MV E/A ratio:  0.98 Shelda Bruckner MD Electronically signed by Shelda Bruckner MD Signature Date/Time: 01/06/2024/4:55:47 PM    Final        LOS: 2 days   Joette Pebbles  [facility] Pager on [email or web address]  01/07/2024, 9:46 AM

## 2024-01-07 NOTE — Progress Notes (Signed)
   01/07/24 2313  BiPAP/CPAP/SIPAP  $ Non-Invasive Home Ventilator  Subsequent  $ Face Mask Medium (S)  Yes  BiPAP/CPAP/SIPAP Pt Type Adult  BiPAP/CPAP/SIPAP Resmed  Mask Type Full face mask  Mask Size Medium  EPAP (S)  10 cmH2O  Flow Rate 5 lpm  Patient Home Equipment No  Auto Titrate No  CPAP/SIPAP surface wiped down Yes  BiPAP/CPAP /SiPAP Vitals  Pulse Rate 97  Resp (!) 23  SpO2 95 %  MEWS Score/Color  MEWS Score 1  MEWS Score Color Green

## 2024-01-07 NOTE — Plan of Care (Signed)

## 2024-01-08 DIAGNOSIS — I480 Paroxysmal atrial fibrillation: Secondary | ICD-10-CM | POA: Diagnosis not present

## 2024-01-08 DIAGNOSIS — R7989 Other specified abnormal findings of blood chemistry: Secondary | ICD-10-CM | POA: Diagnosis not present

## 2024-01-08 DIAGNOSIS — I5033 Acute on chronic diastolic (congestive) heart failure: Secondary | ICD-10-CM | POA: Diagnosis not present

## 2024-01-08 DIAGNOSIS — J9621 Acute and chronic respiratory failure with hypoxia: Secondary | ICD-10-CM | POA: Diagnosis not present

## 2024-01-08 LAB — COMPREHENSIVE METABOLIC PANEL
ALT: 15 U/L (ref 0–44)
AST: 18 U/L (ref 15–41)
Albumin: 2.8 g/dL — ABNORMAL LOW (ref 3.5–5.0)
Alkaline Phosphatase: 63 U/L (ref 38–126)
Anion gap: 10 (ref 5–15)
BUN: 22 mg/dL (ref 8–23)
CO2: 30 mmol/L (ref 22–32)
Calcium: 8.5 mg/dL — ABNORMAL LOW (ref 8.9–10.3)
Chloride: 98 mmol/L (ref 98–111)
Creatinine, Ser: 0.81 mg/dL (ref 0.44–1.00)
GFR, Estimated: >60 mL/min (ref >60)
Glucose, Bld: 215 mg/dL — ABNORMAL HIGH (ref 70–99)
Potassium: 3.8 mmol/L (ref 3.5–5.1)
Sodium: 138 mmol/L (ref 135–145)
Total Bilirubin: 0.6 mg/dL (ref 0.0–1.2)
Total Protein: 5.7 g/dL — ABNORMAL LOW (ref 6.5–8.1)

## 2024-01-08 LAB — CBC
HCT: 28 % — ABNORMAL LOW (ref 36.0–46.0)
Hemoglobin: 8.9 g/dL — ABNORMAL LOW (ref 12.0–15.0)
MCH: 29.3 pg (ref 26.0–34.0)
MCHC: 31.8 g/dL (ref 30.0–36.0)
MCV: 92.1 fL (ref 80.0–100.0)
Platelets: 399 10*3/uL (ref 150–400)
RBC: 3.04 MIL/uL — ABNORMAL LOW (ref 3.87–5.11)
RDW: 19.8 % — ABNORMAL HIGH (ref 11.5–15.5)
WBC: 11.1 10*3/uL — ABNORMAL HIGH (ref 4.0–10.5)
nRBC: 0.8 % — ABNORMAL HIGH (ref 0.0–0.2)

## 2024-01-08 LAB — RETICULOCYTES
Immature Retic Fract: 31.4 % — ABNORMAL HIGH (ref 2.3–15.9)
RBC.: 2.99 MIL/uL — ABNORMAL LOW (ref 3.87–5.11)
Retic Count, Absolute: 162.4 10*3/uL (ref 19.0–186.0)
Retic Ct Pct: 5.4 % — ABNORMAL HIGH (ref 0.4–3.1)

## 2024-01-08 LAB — FERRITIN: Ferritin: 58 ng/mL (ref 11–307)

## 2024-01-08 LAB — IRON AND TIBC
Iron: 63 ug/dL (ref 28–170)
Saturation Ratios: 14 % (ref 10.4–31.8)
TIBC: 455 ug/dL — ABNORMAL HIGH (ref 250–450)
UIBC: 392 ug/dL

## 2024-01-08 LAB — VITAMIN B12: Vitamin B-12: 361 pg/mL (ref 180–914)

## 2024-01-08 LAB — FOLATE: Folate: 10.4 ng/mL (ref >5.9)

## 2024-01-08 NOTE — Progress Notes (Signed)
 Physical Therapy Treatment Patient Details Name: Victoria Delgado MRN: [id number] DOB: 08/01/1941 Today's Date: 01/08/2024   History of Present Illness Patient is 83 y.o. female  presents with complaints of worsening shortness of breath on 01/05/24. Pt  recently hospitalized from 1/25-1/26 with exertional dyspnea.  She had been sent home on 4 L of oxygen at that time. CTA of the chest noted no signs of a PE, cardiomegaly with pulmonary edema with small moderate right and small left layering pleural effusions, and dependent bibasilar atelectasis or consolidation with few new areas throughout both lungs. PMH significant of HT, CAD, PAF chronic anticoagulation, HOCM, severe COPD, OSA/OHS on CPAP, morbid obesity, GERD, RSV infectino complicated by NSTEMI in 2024, AS s/p TAVR.    PT Comments  Pt pleasant and agreeable to PT session. She engaged in transfer training and gait training. Pt performed STS from recliner chair without AD with supervision multiple times. She required a seated recovery after every 5 reps. Pt completed short distance ambulation within the room using RW and CGA with a seated recovery between bouts. Cued pt on PLB techniques during gait to help ease DOE. She maintained SpO2 >90% while ambulating with 4L O2 via [place]. Will continue to follow acutely and advance appropriately.    If plan is discharge home, recommend the following: Assistance with cooking/housework;Direct supervision/assist for medications management;Assist for transportation;Help with stairs or ramp for entrance;A little help with walking and/or transfers;A little help with bathing/dressing/bathroom   Can travel by private vehicle        Equipment Recommendations  None recommended by PT    Recommendations for Other Services       Precautions / Restrictions Precautions Precautions: Fall Restrictions Weight Bearing Restrictions Per Provider Order: No     Mobility  Bed Mobility               General bed  mobility comments: Not assessed. Pt seated in recliner chair at start of session.    Transfers Overall transfer level: Needs assistance Equipment used: None Transfers: Sit to/from Stand Sit to Stand: Supervision           General transfer comment: Pt performed STS from recliner chair 3x5 with seated recovery periods after each set. Pt pushed up from armrests and demonstrated good eccentric control.    Ambulation/Gait Ambulation/Gait assistance: Contact guard assist Gait Distance (Feet): 30 Feet (1x15, seated rest break; 1x30) Assistive device: Rolling walker (2 wheels) Gait Pattern/deviations: Step-through pattern, Decreased stride length       General Gait Details: Pt maintain body within RW at all time and displayed good sequencing. Slow and steady steps navigating the room. Pt maintain SpO2 >90% on 4L via [place] while ambulating. Cued pt on PLB technique to ease DOE.   Stairs             Wheelchair Mobility     Tilt Bed    Modified Rankin (Stroke Patients Only)       Balance Overall balance assessment: Mild deficits observed, not formally tested                                          Cognition Arousal: Alert Behavior During Therapy: WFL for tasks assessed/performed Overall Cognitive Status: Within Functional Limits for tasks assessed  Exercises      General Comments General comments (skin integrity, edema, etc.): VSS on 5L O2      Pertinent Vitals/Pain Pain Assessment Pain Assessment: No/denies pain    Home Living                          Prior Function            PT Goals (current goals can now be found in the care plan section) Acute Rehab PT Goals Patient Stated Goal: Get moving better Progress towards PT goals: Progressing toward goals    Frequency    Min 1X/week      PT Plan      Co-evaluation              AM-PAC PT 6 Clicks  Mobility   Outcome Measure  Help needed turning from your back to your side while in a flat bed without using bedrails?: A Lot Help needed moving from lying on your back to sitting on the side of a flat bed without using bedrails?: A Lot Help needed moving to and from a bed to a chair (including a wheelchair)?: A Lot Help needed standing up from a chair using your arms (e.g., wheelchair or bedside chair)?: A Little Help needed to walk in hospital room?: A Little Help needed climbing 3-5 steps with a railing? : A Lot 6 Click Score: 14    End of Session Equipment Utilized During Treatment: Gait belt;Oxygen Activity Tolerance: Patient limited by fatigue Patient left: in chair;with call bell/phone within reach;with family/visitor present Nurse Communication: Mobility status PT Visit Diagnosis: Unsteadiness on feet (R26.81);Other abnormalities of gait and mobility (R26.89);Difficulty in walking, not elsewhere classified (R26.2);Muscle weakness (generalized) (M62.81)     Time: 8893-8874 PT Time Calculation (min) (ACUTE ONLY): 19 min  Charges:    $Therapeutic Activity: 8-22 mins                       Randall SAUNDERS, PT, DPT Acute Rehabilitation Services Office: [phone] Secure Chat Preferred    Victoria Delgado 01/08/2024, 12:28 PM

## 2024-01-08 NOTE — Progress Notes (Signed)
   Patient Name: Victoria Delgado Date of Encounter: 01/08/2024 [facility] Cardiologist: Lynwood Schilling, MD   Interval Summary  .    Feeling better today.  Less short of breath.  Both husband and son are in room.  They have noticed a significant difference.  Vital Signs .    Vitals:   01/08/24 0405 01/08/24 0800 01/08/24 0805 01/08/24 0806  BP: 105/61  (!) 117/54 (!) 117/54  Pulse: 87  89 89  Resp: 20  18 (!) 21  Temp: 98 F (36.7 C) 98.2 F (36.8 C)  98.2 F (36.8 C)  TempSrc: Axillary Oral  Oral  SpO2: 99%  100%   Weight: 126.3 kg     Height:        Intake/Output Summary (Last 24 hours) at 01/08/2024 1011 Last data filed at 01/08/2024 0806 Gross per 24 hour  Intake --  Output 3100 ml  Net -3100 ml      01/08/2024    4:05 AM 01/07/2024    3:54 AM 01/06/2024    5:00 AM  Last 3 Weights  Weight (lbs) 278 lb 7.1 oz 279 lb 8.7 oz 281 lb 12 oz  Weight (kg) 126.3 kg 126.8 kg 127.8 kg      Telemetry/ECG    Sinus rhythm in the 90s- Personally Reviewed  Physical Exam .   GEN: No acute distress.   Neck: No JVD Cardiac: RRR, 2/6 systolic murmur, no rubs, or gallops.  Respiratory: Crackles bilaterally. GI: Soft, nontender, non-distended  MS: 2+ bilateral lower extremity edema  Assessment & Plan .     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  Murmur heard on exam.  Acute on chronic diastolic heart failure - Continuing with diuresis.  Maintaining adequate blood pressure.  Last check 117/54.  She was out about 2 L yesterday excellent. -Physiologically need to watch for signs of significant hypotension in the setting of outflow tract obstruction. -Currently getting diuresed with Lasix  80 mg IV twice daily (increased on 01/08/2024 to 80).  Lets continue with this strategy.  Improved.  Severe mitral stenosis - 19 mmHg mean gradient  on most recent transthoracic echocardiogram.  Playing a role in her symptomatology as well.  Significant calcified annulus.  Does not appear that she would be a candidate for invasive procedures.  Paroxysmal atrial fibrillation - Currently in sinus rhythm.  On amiodarone  100 mg low-dose.  Continue to monitor high risk medication management with LFTs TSH as outpatient.  Prolonged QTc 498-stable.  No evidence of ventricular tachycardia  Chronic anticoagulation - Continuing with Eliquis .  Hemoglobin stable.  Morbid obesity - BMI 42 currently.  Continuing to encourage weight loss  Possible pneumonia/bronchitis - Was given antibiotics Rocephin  and doxycycline .  Nebulizers.  Steroids. -Sees Dr. Kassie as outpatient and pulmonary.  Underlying lung pathology.     For questions or updates, please contact [facility] Please consult [email or web address] for contact info under        Signed, Oneil Parchment, MD

## 2024-01-08 NOTE — Care Management Important Message (Signed)
 Important Message  Patient Details  Name: Victoria Delgado MRN: [id number] Date of Birth: August 26, 1941   Important Message Given:  Yes - Medicare IM     Janith Melnick 01/08/2024, 10:32 AM

## 2024-01-08 NOTE — Progress Notes (Signed)
 [facility] PROGRESS NOTE   Victoria Delgado FMW:[id number] DOB: 03-14-1941 DOA: 01/05/2024  PCP: Valentin Skates, DO  Brief History: 83 y.o. female with medical history significant of hypertension, CAD, paroxysmal atrial fibrillation chronic anticoagulation, HOCM, severe COPD, OSA/OHS on CPAP, morbid obesity, and GERD presents with complaints of worsening shortness of breath.  Currently admitted for acute on chronic hypoxic respiratory failure secondary to diastolic CHF exacerbation.   Consultants: Cardiology  Procedures: Echocardiogram    Subjective/Interval History: Patient continues to have shortness of breath but feels slightly better from yesterday. Has cough with slightly yellowish sputum. No blood in the sputum. No chest pain. No nausea vomiting.     Assessment/Plan:  Acute on chronic respiratory failure with hypoxia and hypercapnia Suspected diastolic congestive heart failure exacerbation Bilateral pleural effusions Patient presented with complaints of worsening shortness of breath and orthopnea.   Just recently discharged from the hospital for dyspnea on exertion on 4 L of nasal cannula oxygen.  Denies any significant change in weight.  Noted to be hypoxic into the 80s even on 4 L with ambulation.   CT angiogram of the chest obtained which noted cardiomegaly with pulmonary edema and layering pleural effusions.  BNP was 241.7.   Echocardiogram was done which showed EF of 70 to 75%.  Diastolic function could not be evaluated.  Right ventricular systolic function was normal.  Elevated pulmonary artery systolic pressure was noted. Patient was placed on IV diuretics.  She did diurese and yesterday.  Check daily weights.  Renal function and electrolytes are stable.  She was seen by cardiology at admission.  Increased dose of furosemide .  Monitor blood pressures closely. -continue to wean O2 as able-- suspect will go back on O2 at home but does need to be able to complete  ADLs -PT/OT   Possible pneumonia Bronchitis CT angiogram of the chest:  noted bilateral opacities with superimposed infection cannot be ruled out.  Patient was started on antibiotics of Rocephin  and doxycycline  x 5 days For now we will continue with antibiotics.  Nebulizer treatments.  Also started on steroids. -Mucinex    Elevated troponin Acute on chronic.  High-sensitivity troponin 31-> 30.  Normal troponin slightly higher than previous but still essentially flat.  Suspect this secondary to demand  Normocytic anemia Hemoglobin was 14 back in October.  Her current hemoglobin is definitely lower than her baseline.  No overt bleeding has been mentioned.  Check FOBT.  Check anemia panel.  Noted to be on anticoagulation.  No clear indication to hold it at this time since there is no active bleeding.  Considering her tenuous respiratory status she is currently not stable enough for urgent endoscopic evaluation.   Paroxysmal atrial fibrillation anticoagulation Patient currently in sinus rhythm -Continue Eliquis   Enlarged mediastinal lymph nodes Incidentally noted on CT angiogram.  Will need reevaluation in a few weeks.  Enlargement of lymph nodes likely reactive.  Prolonged QT interval On admission QT C noted to be 498.     Essential hypertension Patient's blood pressures had been noted to be soft during last hospitalization for which metoprolol  succinate had been recommended to be discontinued.  Blood pressure is stable.  Not on any blood pressure lowering agents apart from the diuretics.   Diabetes mellitus type 2 with hyperglycemia without long-term use of insulin  - hemoglobin A1c was noted to be 7.1 when checked 12/07/2022.   Hyperlipidemia -Continue Crestor  and Zetia    GERD -Continue pharmacy substitution of Pepcid  for omeprazole  as patient has allergy  to recommended substitution Protonix    OSA  Has not been using her CPAP.   Morbid obesity Estimated body mass index is 42.34  kg/m as calculated from the following:   Height as of this encounter: 5' 8 (1.727 m).   Weight as of this encounter: 126.3 kg.      DVT prophylaxis: Eliquis  CODE STATUS: full Family communication: Discussed with patient.  No family at bedside. Disposition: Hopefully return home when improved   Status is: Inpatient Remains inpatient appropriate because: Acute CHF, bronchitis      Medications: Scheduled:  amiodarone   100 mg Oral Daily   apixaban   5 mg Oral BID   arformoterol   15 mcg Nebulization BID   atropine   1 drop Right Eye Daily   budesonide  (PULMICORT ) nebulizer solution  0.5 mg Nebulization BID   ezetimibe   10 mg Oral Daily   furosemide   80 mg Intravenous BID   omeprazole   40 mg Oral Daily   predniSONE   50 mg Oral Q breakfast   rosuvastatin   20 mg Oral Daily   sodium chloride  flush  3 mL Intravenous Q12H   Continuous:  cefTRIAXone  (ROCEPHIN )  IV 2 g (01/07/24 1246)   doxycycline  (VIBRAMYCIN ) IV 100 mg (01/08/24 1107)   PRN:acetaminophen , ipratropium, levalbuterol , polyvinyl alcohol      Objective:  Vital Signs  Vitals:   01/08/24 0405 01/08/24 0800 01/08/24 0805 01/08/24 0806  BP: 105/61  (!) 117/54 (!) 117/54  Pulse: 87  89 89  Resp: 20  18 (!) 21  Temp: 98 F (36.7 C) 98.2 F (36.8 C)  98.2 F (36.8 C)  TempSrc: Axillary Oral  Oral  SpO2: 99%  100%   Weight: 126.3 kg     Height:        Intake/Output Summary (Last 24 hours) at 01/08/2024 1128 Last data filed at 01/08/2024 1052 Gross per 24 hour  Intake 3 ml  Output 3250 ml  Net -3247 ml   Filed Weights   01/06/24 0500 01/07/24 0354 01/08/24 0405  Weight: 127.8 kg 126.8 kg 126.3 kg     General: Appearance:    Severely obese female in no acute distress     Lungs:     respirations unlabored  Heart:    Normal heart rate.   MS:   All extremities are intact.   Neurologic:   Awake, alert      Lab Results:  Data Reviewed: I have personally reviewed following labs and reports of the imaging  studies  CBC: Recent Labs  Lab 01/05/24 0540 01/05/24 0558 01/06/24 0644 01/07/24 0739 01/08/24 0325  WBC 10.7*  --  13.0* 12.3* 11.1*  NEUTROABS 8.5*  --   --  9.6*  --   HGB 10.2* 9.9* 9.5* 8.9* 8.9*  HCT 32.1* 29.0* 30.2* 27.6* 28.0*  MCV 90.2  --  90.4 91.7 92.1  PLT 377  --  411* 417* 399    Basic Metabolic Panel: Recent Labs  Lab 01/05/24 0540 01/05/24 0558 01/06/24 0644 01/07/24 0739 01/08/24 0325  NA 136 137 137 140 138  K 3.9 3.9 3.8 4.5 3.8  CL 96*  --  98 100 98  CO2 28  --  29 30 30   GLUCOSE 185*  --  173* 188* 215*  BUN 16  --  16 23 22   CREATININE 0.91  --  0.79 0.93 0.81  CALCIUM  8.9  --  8.4* 8.7* 8.5*    GFR: Estimated Creatinine Clearance: 77.7 mL/min (by C-G formula based on SCr  of 0.81 mg/dL).  Liver Function Tests: Recent Labs  Lab 01/07/24 0739 01/08/24 0325  AST 20 18  ALT 14 15  ALKPHOS 68 63  BILITOT 0.5 0.6  PROT 5.7* 5.7*  ALBUMIN 2.7* 2.8*     BNP (last 3 results) Recent Labs    01/15/23 1343  PROBNP 302.0*    HbA1C: Recent Labs    01/05/24 1721  HGBA1C 6.6*     Recent Results (from the past 240 hours)  Resp panel by RT-PCR (RSV, Flu A&B, Covid) Anterior Nasal Swab     Status: None   Collection Time: 01/05/24  5:40 AM   Specimen: Anterior Nasal Swab  Result Value Ref Range Status   SARS Coronavirus 2 by RT PCR NEGATIVE NEGATIVE Final   Influenza A by PCR NEGATIVE NEGATIVE Final   Influenza B by PCR NEGATIVE NEGATIVE Final    Comment: (NOTE) The Xpert Xpress SARS-CoV-2/FLU/RSV plus assay is intended as an aid in the diagnosis of influenza from Nasopharyngeal swab specimens and should not be used as a sole basis for treatment. Nasal washings and aspirates are unacceptable for Xpert Xpress SARS-CoV-2/FLU/RSV testing.  Fact Sheet for Patients: bloggercourse.com  Fact Sheet for Healthcare Providers: seriousbroker.it  This test is not yet approved or cleared  by the [place]  FDA and has been authorized for detection and/or diagnosis of SARS-CoV-2 by FDA under an Emergency Use Authorization (EUA). This EUA will remain in effect (meaning this test can be used) for the duration of the COVID-19 declaration under Section 564(b)(1) of the Act, 21 U.S.C. section 360bbb-3(b)(1), unless the authorization is terminated or revoked.     Resp Syncytial Virus by PCR NEGATIVE NEGATIVE Final    Comment: (NOTE) Fact Sheet for Patients: bloggercourse.com  Fact Sheet for Healthcare Providers: seriousbroker.it  This test is not yet approved or cleared by the [place]  FDA and has been authorized for detection and/or diagnosis of SARS-CoV-2 by FDA under an Emergency Use Authorization (EUA). This EUA will remain in effect (meaning this test can be used) for the duration of the COVID-19 declaration under Section 564(b)(1) of the Act, 21 U.S.C. section 360bbb-3(b)(1), unless the authorization is terminated or revoked.  Performed at [facility], [address], [place], [place] [address]       Radiology Studies: No results found.      LOS: 3 days   Harlene RAYMOND Bowl  [facility] Pager on [email or web address]  01/08/2024, 11:28 AM

## 2024-01-09 ENCOUNTER — Encounter (HOSPITAL_BASED_OUTPATIENT_CLINIC_OR_DEPARTMENT_OTHER): Payer: Medicare Other

## 2024-01-09 DIAGNOSIS — J9621 Acute and chronic respiratory failure with hypoxia: Secondary | ICD-10-CM | POA: Diagnosis not present

## 2024-01-09 LAB — COMPREHENSIVE METABOLIC PANEL
ALT: 13 U/L (ref 0–44)
AST: 21 U/L (ref 15–41)
Albumin: 3 g/dL — ABNORMAL LOW (ref 3.5–5.0)
Alkaline Phosphatase: 65 U/L (ref 38–126)
Anion gap: 13 (ref 5–15)
BUN: 28 mg/dL — ABNORMAL HIGH (ref 8–23)
CO2: 29 mmol/L (ref 22–32)
Calcium: 8.8 mg/dL — ABNORMAL LOW (ref 8.9–10.3)
Chloride: 98 mmol/L (ref 98–111)
Creatinine, Ser: 1.14 mg/dL — ABNORMAL HIGH (ref 0.44–1.00)
GFR, Estimated: 49 mL/min — ABNORMAL LOW (ref >60)
Glucose, Bld: 166 mg/dL — ABNORMAL HIGH (ref 70–99)
Potassium: 3.6 mmol/L (ref 3.5–5.1)
Sodium: 140 mmol/L (ref 135–145)
Total Bilirubin: 0.7 mg/dL (ref 0.0–1.2)
Total Protein: 5.8 g/dL — ABNORMAL LOW (ref 6.5–8.1)

## 2024-01-09 LAB — CBC
HCT: 28.3 % — ABNORMAL LOW (ref 36.0–46.0)
Hemoglobin: 8.9 g/dL — ABNORMAL LOW (ref 12.0–15.0)
MCH: 28.8 pg (ref 26.0–34.0)
MCHC: 31.4 g/dL (ref 30.0–36.0)
MCV: 91.6 fL (ref 80.0–100.0)
Platelets: 360 10*3/uL (ref 150–400)
RBC: 3.09 MIL/uL — ABNORMAL LOW (ref 3.87–5.11)
RDW: 19.9 % — ABNORMAL HIGH (ref 11.5–15.5)
WBC: 10.5 10*3/uL (ref 4.0–10.5)
nRBC: 0.6 % — ABNORMAL HIGH (ref 0.0–0.2)

## 2024-01-09 MED ORDER — FUROSEMIDE 10 MG/ML IJ SOLN
80.0000 mg | Freq: Once | INTRAMUSCULAR | Status: DC
Start: 2024-01-09 — End: 2024-01-09

## 2024-01-09 MED ORDER — POTASSIUM CHLORIDE CRYS ER 20 MEQ PO TBCR
40.0000 meq | EXTENDED_RELEASE_TABLET | Freq: Once | ORAL | Status: AC
  Administered 2024-01-09: 20 meq via ORAL
  Filled 2024-01-09: qty 2

## 2024-01-09 MED ORDER — FUROSEMIDE 10 MG/ML IJ SOLN
80.0000 mg | Freq: Two times a day (BID) | INTRAMUSCULAR | Status: DC
  Administered 2024-01-09 – 2024-01-11 (×5): 80 mg via INTRAVENOUS
  Filled 2024-01-09 (×7): qty 8

## 2024-01-09 NOTE — Progress Notes (Signed)
 Occupational Therapy Treatment Patient Details Name: Victoria Delgado MRN: [id number] DOB: February 13, 1941 Today's Date: 01/09/2024   History of present illness Patient is 83 y.o. female  presents with complaints of worsening shortness of breath on 01/05/24. Pt  recently hospitalized from 1/25-1/26 with exertional dyspnea.  She had been sent home on 4 L of oxygen at that time. CTA of the chest noted no signs of a PE, cardiomegaly with pulmonary edema with small moderate right and small left layering pleural effusions, and dependent bibasilar atelectasis or consolidation with few new areas throughout both lungs. PMH significant of HT, CAD, PAF chronic anticoagulation, HOCM, severe COPD, OSA/OHS on CPAP, morbid obesity, GERD, RSV infectino complicated by NSTEMI in 2024, AS s/p TAVR.   OT comments  Pt making good progress towards OT goals. Session focused on full EOB bathing/dressing tasks with collaboration regarding energy conservation strategy implementation. Pt able to manage UB ADLs with Setup Assist and LB ADLs with Mod A. Pt able to transfer to recliner without AD and no more than CGA at end of session. Pt with variable SpO2 readings during session, removed O2 for majority of bathing and denied any SOB though SpO2 84-88%. Left on 4 L O2 at end of session per pt request with continued variable readings but 87-92%; RN present and aware.      If plan is discharge home, recommend the following:  A little help with walking and/or transfers;A lot of help with bathing/dressing/bathroom;Assistance with cooking/housework;Assist for transportation;Help with stairs or ramp for entrance   Equipment Recommendations  None recommended by OT    Recommendations for Other Services      Precautions / Restrictions Precautions Precautions: Fall Precaution Comments: monitor O2 Restrictions Weight Bearing Restrictions Per Provider Order: No       Mobility Bed Mobility Overal bed mobility: Modified Independent                   Transfers Overall transfer level: Needs assistance Equipment used: None Transfers: Sit to/from Stand, Bed to chair/wheelchair/BSC Sit to Stand: Supervision     Step pivot transfers: Contact guard assist     General transfer comment: declined need for AD to transfer to chair. CGA for safety with dynamic tasks though no overt LOB     Balance Overall balance assessment: Mild deficits observed, not formally tested                                         ADL either performed or assessed with clinical judgement   ADL Overall ADL's : Needs assistance/impaired     Grooming: Set up;Sitting;Wash/dry face;Oral care;Applying deodorant;Brushing hair   Upper Body Bathing: Set up;Sitting   Lower Body Bathing: Minimal assistance;Sitting/lateral leans;Sit to/from stand Lower Body Bathing Details (indicate cue type and reason): assist for lower LE due to difficulty reaching. pt reported peri region cleaned well earlier and declined need for this Upper Body Dressing : Set up;Sitting   Lower Body Dressing: Moderate assistance;Sitting/lateral leans;Sit to/from stand Lower Body Dressing Details (indicate cue type and reason): assist for B socks, difficulty reaching. pt familiar with sock aide but also has husband assist if needed               General ADL Comments: Emphasis on energy conservation strategies with handout provided. pt reports implementing many of these strategies at home already since RSV infection last year  Extremity/Trunk Assessment Upper Extremity Assessment Upper Extremity Assessment: Overall WFL for tasks assessed;Right hand dominant   Lower Extremity Assessment Lower Extremity Assessment: Defer to PT evaluation        Vision   Vision Assessment?: No apparent visual deficits   Perception     Praxis      Cognition Arousal: Alert Behavior During Therapy: WFL for tasks assessed/performed Overall Cognitive Status: Within  Functional Limits for tasks assessed                                          Exercises      Shoulder Instructions       General Comments Variable O2 readings. Pt performed EOB bath without O2 on and denied SOB. replaced O2 probe with SpO2 84% up to 88%. replaced on 3 L O2 with similar O2 readings and left on 4 per pt request (reports pulmonologist recommended that). RN in, aware and to further asses    Pertinent Vitals/ Pain       Pain Assessment Pain Assessment: No/denies pain  Home Living                                          Prior Functioning/Environment              Frequency  Min 1X/week        Progress Toward Goals  OT Goals(current goals can now be found in the care plan section)  Progress towards OT goals: Progressing toward goals  Acute Rehab OT Goals Patient Stated Goal: not have to wear O2 during the day OT Goal Formulation: With patient Time For Goal Achievement: 01/21/24 Potential to Achieve Goals: Good ADL Goals Pt Will Perform Grooming: with supervision;standing Pt Will Perform Upper Body Dressing: with supervision;sitting Pt Will Perform Lower Body Dressing: with supervision;sitting/lateral leans;sit to/from stand Pt Will Transfer to Toilet: with supervision;ambulating;regular height toilet Additional ADL Goal #1: Pt will tolerate OOB activity x10 min with SpO2 above 90% in prep for ADLs Additional ADL Goal #2: pt will verbalize x3 energy conservation strategies in prep for ADLs  Plan      Co-evaluation                 AM-PAC OT 6 Clicks Daily Activity     Outcome Measure   Help from another person eating meals?: None Help from another person taking care of personal grooming?: None Help from another person toileting, which includes using toliet, bedpan, or urinal?: A Lot Help from another person bathing (including washing, rinsing, drying)?: A Lot Help from another person to put on and taking  off regular upper body clothing?: A Little Help from another person to put on and taking off regular lower body clothing?: A Lot 6 Click Score: 17    End of Session Equipment Utilized During Treatment: Oxygen  OT Visit Diagnosis: Other abnormalities of gait and mobility (R26.89);Muscle weakness (generalized) (M62.81)   Activity Tolerance Patient tolerated treatment well   Patient Left in chair;with call bell/phone within reach;with nursing/sitter in room   Nurse Communication Mobility status;Other (comment) (O2)        Time: 8987-8943 OT Time Calculation (min): 44 min  Charges: OT General Charges $OT Visit: 1 Visit OT Treatments $Self Care/Home Management : 38-52 mins  Mliss  B, OTR/L Acute Rehab Services Office: [phone]   Mliss Getting 01/09/2024, 11:57 AM

## 2024-01-09 NOTE — Plan of Care (Signed)
  Problem: Clinical Measurements: Goal: Ability to maintain clinical measurements within normal limits will improve Outcome: Progressing Goal: Will remain free from infection Outcome: Progressing Goal: Diagnostic test results will improve Outcome: Progressing Note: Reduced oxygen to 3 liters from 5 liters. Patient tolerating well Goal: Respiratory complications will improve Outcome: Progressing   Problem: Clinical Measurements: Goal: Will remain free from infection Outcome: Progressing   Problem: Clinical Measurements: Goal: Diagnostic test results will improve Outcome: Progressing Note: Reduced oxygen to 3 liters from 5 liters. Patient tolerating well   Problem: Clinical Measurements: Goal: Respiratory complications will improve Outcome: Progressing

## 2024-01-09 NOTE — Progress Notes (Signed)
 [facility] PROGRESS NOTE   ZURIYAH SHATZ FMW:[id number] DOB: 05/25/1941 DOA: 01/05/2024  PCP: Valentin Skates, DO  Brief History: 83 y.o. female with medical history significant of hypertension, CAD, paroxysmal atrial fibrillation chronic anticoagulation, HOCM, severe COPD, OSA/OHS on CPAP, morbid obesity, and GERD presents with complaints of worsening shortness of breath.  Currently admitted for acute on chronic hypoxic respiratory failure secondary to diastolic CHF exacerbation.      Subjective/Interval History: Sat in a chair for a while yesterday    Assessment/Plan:  Acute on chronic respiratory failure with hypoxia and hypercapnia Suspected diastolic congestive heart failure exacerbation Bilateral pleural effusions Patient presented with complaints of worsening shortness of breath and orthopnea.   Just recently discharged from the hospital for dyspnea on exertion on 4 L of nasal cannula oxygen.  Denies any significant change in weight.  Noted to be hypoxic into the 80s even on 4 L with ambulation.   CT angiogram of the chest obtained which noted cardiomegaly with pulmonary edema and layering pleural effusions.  BNP was 241.7.   Echocardiogram was done which showed EF of 70 to 75%.  Diastolic function could not be evaluated.  Right ventricular systolic function was normal.  Elevated pulmonary artery systolic pressure was noted. Patient was placed on IV diuretics-- per cards   Monitor blood pressures closely. -continue to wean O2 as able-- suspect will go back on O2 at home but does need to be able to complete ADLs -PT/OT   Possible pneumonia Bronchitis CT angiogram of the chest:  noted bilateral opacities with superimposed infection cannot be ruled out.  Patient was started on antibiotics of Rocephin  and doxycycline  x 5 days For now we will continue with antibiotics.  Nebulizer treatments.  Also started on steroids. -Mucinex    Elevated troponin Acute on chronic.   High-sensitivity troponin 31-> 30.  Normal troponin slightly higher than previous but still essentially flat.  Suspect this secondary to demand  Normocytic anemia Hemoglobin was 14 back in October.  Her current hemoglobin is definitely lower than her baseline.  No overt bleeding has been mentioned.  Check FOBT.  Check anemia panel.  Noted to be on anticoagulation.  No clear indication to hold it at this time since there is no active bleeding.  Considering her tenuous respiratory status she is currently not stable enough for urgent endoscopic evaluation.   Paroxysmal atrial fibrillation anticoagulation Patient currently in sinus rhythm -Continue Eliquis   Enlarged mediastinal lymph nodes Incidentally noted on CT angiogram.  Will need reevaluation in a few weeks.  Enlargement of lymph nodes likely reactive.  Prolonged QT interval On admission QT C noted to be 498.     Essential hypertension Patient's blood pressures had been noted to be soft during last hospitalization for which metoprolol  succinate had been recommended to be discontinued.  Blood pressure is stable.  Not on any blood pressure lowering agents apart from the diuretics.   Diabetes mellitus type 2 with hyperglycemia without long-term use of insulin  - hemoglobin A1c was noted to be 7.1 when checked 12/07/2022.   Hyperlipidemia -Continue Crestor  and Zetia    GERD -Continue pharmacy substitution of Pepcid  for omeprazole  as patient has allergy to recommended substitution Protonix    OSA  Has not been using her CPAP.   Morbid obesity Estimated body mass index is 41.5 kg/m as calculated from the following:   Height as of this encounter: 5' 8 (1.727 m).   Weight as of this encounter: 123.8 kg.  DVT prophylaxis: Eliquis  CODE STATUS: full Family communication: Discussed with patient.  No family at bedside. Disposition: Hopefully return home when improved suspect several more days   Status is: Inpatient Remains inpatient  appropriate because: Acute CHF, bronchitis      Medications: Scheduled:  amiodarone   100 mg Oral Daily   apixaban   5 mg Oral BID   arformoterol   15 mcg Nebulization BID   atropine   1 drop Right Eye Daily   budesonide  (PULMICORT ) nebulizer solution  0.5 mg Nebulization BID   ezetimibe   10 mg Oral Daily   furosemide   80 mg Intravenous BID   omeprazole   40 mg Oral Daily   predniSONE   50 mg Oral Q breakfast   rosuvastatin   20 mg Oral Daily   sodium chloride  flush  3 mL Intravenous Q12H   Continuous:  doxycycline  (VIBRAMYCIN ) IV 100 mg (01/09/24 0533)   PRN:acetaminophen , ipratropium, levalbuterol , polyvinyl alcohol      Objective:  Vital Signs  Vitals:   01/09/24 0000 01/09/24 0345 01/09/24 0500 01/09/24 0751  BP:  117/60    Pulse:  72    Resp: 20 17 16    Temp:  97.7 F (36.5 C)    TempSrc:  Oral    SpO2:  100%  98%  Weight:   123.8 kg   Height:        Intake/Output Summary (Last 24 hours) at 01/09/2024 1216 Last data filed at 01/09/2024 1146 Gross per 24 hour  Intake 360 ml  Output 1050 ml  Net -690 ml   Filed Weights   01/07/24 0354 01/08/24 0405 01/09/24 0500  Weight: 126.8 kg 126.3 kg 123.8 kg     General: Appearance:    Severely obese female in no acute distress     Lungs:     respirations unlabored, diminished  Heart:    Normal heart rate.   MS:   All extremities are intact.   Neurologic:   Awake, alert      Lab Results:  Data Reviewed: I have personally reviewed following labs and reports of the imaging studies  CBC: Recent Labs  Lab 01/05/24 0540 01/05/24 0558 01/06/24 0644 01/07/24 0739 01/08/24 0325 01/09/24 0759  WBC 10.7*  --  13.0* 12.3* 11.1* 10.5  NEUTROABS 8.5*  --   --  9.6*  --   --   HGB 10.2* 9.9* 9.5* 8.9* 8.9* 8.9*  HCT 32.1* 29.0* 30.2* 27.6* 28.0* 28.3*  MCV 90.2  --  90.4 91.7 92.1 91.6  PLT 377  --  411* 417* 399 360    Basic Metabolic Panel: Recent Labs  Lab 01/05/24 0540 01/05/24 0558 01/06/24 0644  01/07/24 0739 01/08/24 0325 01/09/24 0759  NA 136 137 137 140 138 140  K 3.9 3.9 3.8 4.5 3.8 3.6  CL 96*  --  98 100 98 98  CO2 28  --  29 30 30 29   GLUCOSE 185*  --  173* 188* 215* 166*  BUN 16  --  16 23 22  28*  CREATININE 0.91  --  0.79 0.93 0.81 1.14*  CALCIUM  8.9  --  8.4* 8.7* 8.5* 8.8*    GFR: Estimated Creatinine Clearance: 54.6 mL/min (A) (by C-G formula based on SCr of 1.14 mg/dL (H)).  Liver Function Tests: Recent Labs  Lab 01/07/24 0739 01/08/24 0325 01/09/24 0759  AST 20 18 21   ALT 14 15 13   ALKPHOS 68 63 65  BILITOT 0.5 0.6 0.7  PROT 5.7* 5.7* 5.8*  ALBUMIN 2.7* 2.8* 3.0*  BNP (last 3 results) Recent Labs    01/15/23 1343  PROBNP 302.0*    HbA1C: No results for input(s): HGBA1C in the last 72 hours.    Recent Results (from the past 240 hours)  Resp panel by RT-PCR (RSV, Flu A&B, Covid) Anterior Nasal Swab     Status: None   Collection Time: 01/05/24  5:40 AM   Specimen: Anterior Nasal Swab  Result Value Ref Range Status   SARS Coronavirus 2 by RT PCR NEGATIVE NEGATIVE Final   Influenza A by PCR NEGATIVE NEGATIVE Final   Influenza B by PCR NEGATIVE NEGATIVE Final    Comment: (NOTE) The Xpert Xpress SARS-CoV-2/FLU/RSV plus assay is intended as an aid in the diagnosis of influenza from Nasopharyngeal swab specimens and should not be used as a sole basis for treatment. Nasal washings and aspirates are unacceptable for Xpert Xpress SARS-CoV-2/FLU/RSV testing.  Fact Sheet for Patients: bloggercourse.com  Fact Sheet for Healthcare Providers: seriousbroker.it  This test is not yet approved or cleared by the [place]  FDA and has been authorized for detection and/or diagnosis of SARS-CoV-2 by FDA under an Emergency Use Authorization (EUA). This EUA will remain in effect (meaning this test can be used) for the duration of the COVID-19 declaration under Section 564(b)(1) of the Act, 21  U.S.C. section 360bbb-3(b)(1), unless the authorization is terminated or revoked.     Resp Syncytial Virus by PCR NEGATIVE NEGATIVE Final    Comment: (NOTE) Fact Sheet for Patients: bloggercourse.com  Fact Sheet for Healthcare Providers: seriousbroker.it  This test is not yet approved or cleared by the [place]  FDA and has been authorized for detection and/or diagnosis of SARS-CoV-2 by FDA under an Emergency Use Authorization (EUA). This EUA will remain in effect (meaning this test can be used) for the duration of the COVID-19 declaration under Section 564(b)(1) of the Act, 21 U.S.C. section 360bbb-3(b)(1), unless the authorization is terminated or revoked.  Performed at [facility], [address] [place], [place] [address]       Radiology Studies: No results found.      LOS: 4 days   Harlene RAYMOND Bowl  [facility] Pager on [email or web address]  01/09/2024, 12:16 PM

## 2024-01-09 NOTE — Progress Notes (Signed)
   Patient Name: Victoria Delgado Date of Encounter: 01/09/2024 [facility] Cardiologist: Lynwood Schilling, MD   Interval Summary  .    Slightly more winded.  Good urine output again yesterday 2.4 L creatinine remaining stable for the most part at 1.14 up from 0.932 days ago.  Vital Signs .    Vitals:   01/09/24 0000 01/09/24 0345 01/09/24 0500 01/09/24 0751  BP:  117/60    Pulse:  72    Resp: 20 17 16    Temp:  97.7 F (36.5 C)    TempSrc:  Oral    SpO2:  100%  98%  Weight:   123.8 kg   Height:        Intake/Output Summary (Last 24 hours) at 01/09/2024 0934 Last data filed at 01/08/2024 2130 Gross per 24 hour  Intake 7.56 ml  Output 1350 ml  Net -1342.44 ml      01/09/2024    5:00 AM 01/08/2024    4:05 AM 01/07/2024    3:54 AM  Last 3 Weights  Weight (lbs) 272 lb 14.9 oz 278 lb 7.1 oz 279 lb 8.7 oz  Weight (kg) 123.8 kg 126.3 kg 126.8 kg      Telemetry/ECG    Sinus rhythm- Personally Reviewed  Physical Exam .   GEN: No acute distress.   Neck: No JVD Cardiac: Regular rate and rhythm with 2/6 systolic murmur, rubs, or gallops.  Respiratory: Mild crackles bilaterally. GI: Soft, nontender, non-distended  MS: No edema  Assessment & Plan .     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 Kassie.  -More shortness of breath today.  Yesterday had a much improved today.  We will continue with IV diuresis for now.  No evidence of hypotension.  Remember with outflow tract obstruction if we decrease the preload significantly, we may see hypotension.  So far so good.  -Of course challenging situation.  I do not believe that she is an invasive  candidate for further valvular therapies.  Maintaining adequate fluid balance will be vital for her comfort.   For questions or updates, please contact [facility] Please consult [email or web address] for contact info under        Signed, Oneil Parchment, MD

## 2024-01-10 DIAGNOSIS — J9621 Acute and chronic respiratory failure with hypoxia: Secondary | ICD-10-CM | POA: Diagnosis not present

## 2024-01-10 LAB — COMPREHENSIVE METABOLIC PANEL
ALT: 16 U/L (ref 0–44)
AST: 18 U/L (ref 15–41)
Albumin: 2.9 g/dL — ABNORMAL LOW (ref 3.5–5.0)
Alkaline Phosphatase: 59 U/L (ref 38–126)
Anion gap: 11 (ref 5–15)
BUN: 26 mg/dL — ABNORMAL HIGH (ref 8–23)
CO2: 32 mmol/L (ref 22–32)
Calcium: 8.7 mg/dL — ABNORMAL LOW (ref 8.9–10.3)
Chloride: 98 mmol/L (ref 98–111)
Creatinine, Ser: 0.95 mg/dL (ref 0.44–1.00)
GFR, Estimated: >60 mL/min (ref >60)
Glucose, Bld: 159 mg/dL — ABNORMAL HIGH (ref 70–99)
Potassium: 3.2 mmol/L — ABNORMAL LOW (ref 3.5–5.1)
Sodium: 141 mmol/L (ref 135–145)
Total Bilirubin: 0.8 mg/dL (ref 0.0–1.2)
Total Protein: 5.7 g/dL — ABNORMAL LOW (ref 6.5–8.1)

## 2024-01-10 LAB — CBC
HCT: 27.5 % — ABNORMAL LOW (ref 36.0–46.0)
Hemoglobin: 8.7 g/dL — ABNORMAL LOW (ref 12.0–15.0)
MCH: 28.6 pg (ref 26.0–34.0)
MCHC: 31.6 g/dL (ref 30.0–36.0)
MCV: 90.5 fL (ref 80.0–100.0)
Platelets: 373 10*3/uL (ref 150–400)
RBC: 3.04 MIL/uL — ABNORMAL LOW (ref 3.87–5.11)
RDW: 20.1 % — ABNORMAL HIGH (ref 11.5–15.5)
WBC: 10.8 10*3/uL — ABNORMAL HIGH (ref 4.0–10.5)
nRBC: 0.6 % — ABNORMAL HIGH (ref 0.0–0.2)

## 2024-01-10 MED ORDER — POTASSIUM CHLORIDE CRYS ER 20 MEQ PO TBCR
40.0000 meq | EXTENDED_RELEASE_TABLET | Freq: Once | ORAL | Status: AC
  Administered 2024-01-10: 40 meq via ORAL
  Filled 2024-01-10: qty 2

## 2024-01-10 NOTE — Progress Notes (Addendum)
 [facility] PROGRESS NOTE   Victoria Delgado FMW:[id number] DOB: 1941/11/10 DOA: 01/05/2024  PCP: Valentin Skates, DO  Brief History: 83 y.o. female with medical history significant of hypertension, CAD, paroxysmal atrial fibrillation chronic anticoagulation, HOCM, severe COPD, OSA/OHS on CPAP, morbid obesity, and GERD presents with complaints of worsening shortness of breath.  Currently admitted for acute on chronic hypoxic respiratory failure secondary to diastolic CHF exacerbation.      Subjective/Interval History: Sat in a chair for a while yesterday    Assessment/Plan:  Acute on chronic respiratory failure with hypoxia and hypercapnia Suspected diastolic congestive heart failure exacerbation Bilateral pleural effusions Patient presented with complaints of worsening shortness of breath and orthopnea.   Just recently discharged from the hospital for dyspnea on exertion on 4 L of nasal cannula oxygen.  Denies any significant change in weight.  Noted to be hypoxic into the 80s even on 4 L with ambulation.   CT angiogram of the chest obtained which noted cardiomegaly with pulmonary edema and layering pleural effusions.  BNP was 241.7.   Echocardiogram was done which showed EF of 70 to 75%.  Diastolic function could not be evaluated.  Right ventricular systolic function was normal.  Elevated pulmonary artery systolic pressure was noted. Patient was placed on IV diuretics-- per cards   Monitor blood pressures closely. -continue to wean O2 as able-- suspect will go back on O2 at home but does need to be able to complete ADLs -PT/OT   Possible pneumonia Bronchitis CT angiogram of the chest:  noted bilateral opacities with superimposed infection cannot be ruled out.  Patient was started on antibiotics of Rocephin  and doxycycline  x 5 days For now we will continue with antibiotics.  Nebulizer treatments.  Also started on steroids. -Mucinex    Elevated troponin Acute on chronic.   High-sensitivity troponin 31-> 30.  Normal troponin slightly higher than previous but still essentially flat.  Suspect this secondary to demand  Normocytic anemia Hemoglobin was 14 back in October.  Her current hemoglobin is definitely lower than her baseline.  No overt bleeding has been mentioned.  Check FOBT.  Check anemia panel.  Noted to be on anticoagulation.  No clear indication to hold it at this time since there is no active bleeding.  Considering her tenuous respiratory status she is currently not stable enough for urgent endoscopic evaluation.   Paroxysmal atrial fibrillation anticoagulation Patient currently in sinus rhythm -Continue Eliquis   Enlarged mediastinal lymph nodes Incidentally noted on CT angiogram.  Will need reevaluation in a few weeks.  Enlargement of lymph nodes likely reactive.  Prolonged QT interval On admission QT C noted to be 498.     Essential hypertension Patient's blood pressures had been noted to be soft during last hospitalization for which metoprolol  succinate had been recommended to be discontinued.  Blood pressure is stable.  Not on any blood pressure lowering agents apart from the diuretics.   Diabetes mellitus type 2 with hyperglycemia without long-term use of insulin  - hemoglobin A1c was noted to be 7.1 when checked 12/07/2022.   Hyperlipidemia -Continue Crestor  and Zetia    GERD -Continue pharmacy substitution of Pepcid  for omeprazole  as patient has allergy to recommended substitution Protonix    OSA  --? Possible O2 in CPAP at night. Message sent to [facility] and Dr. Kassie -resp therapy for overnight pulse ox   Morbid obesity Estimated body mass index is 41.1 kg/m as calculated from the following:   Height as of this encounter: 5' 8 (1.727 m).  Weight as of this encounter: 122.6 kg.      DVT prophylaxis: Eliquis  CODE STATUS: full Family communication: Discussed with patient.  No family at bedside. Disposition: Hopefully return home when  improved suspect several more days   Status is: Inpatient Remains inpatient appropriate because: Acute CHF, bronchitis      Medications: Scheduled:  amiodarone   100 mg Oral Daily   apixaban   5 mg Oral BID   arformoterol   15 mcg Nebulization BID   atropine   1 drop Right Eye Daily   budesonide  (PULMICORT ) nebulizer solution  0.5 mg Nebulization BID   ezetimibe   10 mg Oral Daily   furosemide   80 mg Intravenous BID   omeprazole   40 mg Oral Daily   predniSONE   50 mg Oral Q breakfast   rosuvastatin   20 mg Oral Daily   sodium chloride  flush  3 mL Intravenous Q12H   Continuous:   PRN:acetaminophen , ipratropium, levalbuterol , polyvinyl alcohol      Objective:  Vital Signs  Vitals:   01/10/24 0834 01/10/24 0838 01/10/24 0839 01/10/24 1255  BP: (!) 109/42 (!) 109/42  (!) 101/54  Pulse: 84  80 86  Resp: (!) 31   15  Temp: 97.6 F (36.4 C) 97.6 F (36.4 C)  98.1 F (36.7 C)  TempSrc: Oral   Oral  SpO2: 97%  96% 92%  Weight:      Height:        Intake/Output Summary (Last 24 hours) at 01/10/2024 1300 Last data filed at 01/10/2024 9062 Gross per 24 hour  Intake 360 ml  Output 1800 ml  Net -1440 ml   Filed Weights   01/08/24 0405 01/09/24 0500 01/10/24 0330  Weight: 126.3 kg 123.8 kg 122.6 kg     General: Appearance:    Severely obese female in no acute distress     Lungs:     respirations unlabored, diminished  Heart:    Normal heart rate.   MS:   All extremities are intact.   Neurologic:   Awake, alert      Lab Results:  Data Reviewed: I have personally reviewed following labs and reports of the imaging studies  CBC: Recent Labs  Lab 01/05/24 0540 01/05/24 0558 01/06/24 0644 01/07/24 0739 01/08/24 0325 01/09/24 0759 01/10/24 0639  WBC 10.7*  --  13.0* 12.3* 11.1* 10.5 10.8*  NEUTROABS 8.5*  --   --  9.6*  --   --   --   HGB 10.2*   < > 9.5* 8.9* 8.9* 8.9* 8.7*  HCT 32.1*   < > 30.2* 27.6* 28.0* 28.3* 27.5*  MCV 90.2  --  90.4 91.7 92.1 91.6 90.5   PLT 377  --  411* 417* 399 360 373   < > = values in this interval not displayed.    Basic Metabolic Panel: Recent Labs  Lab 01/06/24 0644 01/07/24 0739 01/08/24 0325 01/09/24 0759 01/10/24 0639  NA 137 140 138 140 141  K 3.8 4.5 3.8 3.6 3.2*  CL 98 100 98 98 98  CO2 29 30 30 29  32  GLUCOSE 173* 188* 215* 166* 159*  BUN 16 23 22  28* 26*  CREATININE 0.79 0.93 0.81 1.14* 0.95  CALCIUM  8.4* 8.7* 8.5* 8.8* 8.7*    GFR: Estimated Creatinine Clearance: 65.2 mL/min (by C-G formula based on SCr of 0.95 mg/dL).  Liver Function Tests: Recent Labs  Lab 01/07/24 0739 01/08/24 0325 01/09/24 0759 01/10/24 0639  AST 20 18 21 18   ALT 14 15 13  16  ALKPHOS 68 63 65 59  BILITOT 0.5 0.6 0.7 0.8  PROT 5.7* 5.7* 5.8* 5.7*  ALBUMIN 2.7* 2.8* 3.0* 2.9*     BNP (last 3 results) Recent Labs    01/15/23 1343  PROBNP 302.0*    HbA1C: No results for input(s): HGBA1C in the last 72 hours.    Recent Results (from the past 240 hours)  Resp panel by RT-PCR (RSV, Flu A&B, Covid) Anterior Nasal Swab     Status: None   Collection Time: 01/05/24  5:40 AM   Specimen: Anterior Nasal Swab  Result Value Ref Range Status   SARS Coronavirus 2 by RT PCR NEGATIVE NEGATIVE Final   Influenza A by PCR NEGATIVE NEGATIVE Final   Influenza B by PCR NEGATIVE NEGATIVE Final    Comment: (NOTE) The Xpert Xpress SARS-CoV-2/FLU/RSV plus assay is intended as an aid in the diagnosis of influenza from Nasopharyngeal swab specimens and should not be used as a sole basis for treatment. Nasal washings and aspirates are unacceptable for Xpert Xpress SARS-CoV-2/FLU/RSV testing.  Fact Sheet for Patients: bloggercourse.com  Fact Sheet for Healthcare Providers: seriousbroker.it  This test is not yet approved or cleared by the [place]  FDA and has been authorized for detection and/or diagnosis of SARS-CoV-2 by FDA under an Emergency Use Authorization  (EUA). This EUA will remain in effect (meaning this test can be used) for the duration of the COVID-19 declaration under Section 564(b)(1) of the Act, 21 U.S.C. section 360bbb-3(b)(1), unless the authorization is terminated or revoked.     Resp Syncytial Virus by PCR NEGATIVE NEGATIVE Final    Comment: (NOTE) Fact Sheet for Patients: bloggercourse.com  Fact Sheet for Healthcare Providers: seriousbroker.it  This test is not yet approved or cleared by the [place]  FDA and has been authorized for detection and/or diagnosis of SARS-CoV-2 by FDA under an Emergency Use Authorization (EUA). This EUA will remain in effect (meaning this test can be used) for the duration of the COVID-19 declaration under Section 564(b)(1) of the Act, 21 U.S.C. section 360bbb-3(b)(1), unless the authorization is terminated or revoked.  Performed at [facility], [address], [place], [place] [address]       Radiology Studies: No results found.      LOS: 5 days   Harlene RAYMOND Bowl  [facility] Pager on [email or web address]  01/10/2024, 1:00 PM

## 2024-01-10 NOTE — Progress Notes (Signed)
   Patient Name: Victoria Delgado Date of Encounter: 01/10/2024 [facility] Cardiologist: Lynwood Schilling, MD   Interval Summary  .    [employer]. Feeling better but not at baseline.  Would like O2 in her CPAP at night.   Vital Signs .    Vitals:   01/10/24 0803 01/10/24 0834 01/10/24 0838 01/10/24 0839  BP:  (!) 109/42 (!) 109/42   Pulse:  84  80  Resp:  (!) 31    Temp:  97.6 F (36.4 C) 97.6 F (36.4 C)   TempSrc:  Oral    SpO2: 99% 97%  96%  Weight:      Height:        Intake/Output Summary (Last 24 hours) at 01/10/2024 1034 Last data filed at 01/10/2024 9062 Gross per 24 hour  Intake 360 ml  Output 2250 ml  Net -1890 ml      01/10/2024    3:30 AM 01/09/2024    5:00 AM 01/08/2024    4:05 AM  Last 3 Weights  Weight (lbs) 270 lb 4.5 oz 272 lb 14.9 oz 278 lb 7.1 oz  Weight (kg) 122.6 kg 123.8 kg 126.3 kg      Telemetry/ECG    Sinus rhythm- Personally Reviewed  Physical Exam .   GEN: No acute distress.   Neck: No JVD Cardiac: RRR, 2/6 systolic murmur, rubs, or gallops.  Respiratory: Crackles bilaterally. GI: Soft, nontender, non-distended  MS: Mild chronic edema  Assessment & Plan .     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 Kassie as outpatient.  Multiple high risk comorbidities Acute on chronic diastolic heart failure with hypertrophic cardiomyopathy - Continuing with IV diuresis with Lasix  80 mg IV twice daily.  Creatinine 0.95 this morning potassium 3.2.  Replete. -Watch for signs of rapidly decreasing preload which could lead to worsening outflow tract obstruction.  Challenging situation  with severe valvular abnormality as well.  For now continue with diuresis.  Paroxysmal atrial fibrillation - Currently on amiodarone  100 mg a day low-dose.  No changes made.  High risk medication management  Hyperlipidemia - On Crestor  20 mg a day.  On Zetia  as well  OSA --? Possible O2 in CPAP at night. Dr. Vann discussed.   Given her multiple comorbidities, I do not believe that she would be a candidate for further invasive therapies.   For questions or updates, please contact [facility] Please consult [email or web address] for contact info under        Signed, Oneil Parchment, MD

## 2024-01-10 NOTE — Plan of Care (Signed)
   Problem: Health Behavior/Discharge Planning: Goal: Ability to manage health-related needs will improve Outcome: Progressing   Problem: Clinical Measurements: Goal: Ability to maintain clinical measurements within normal limits will improve Outcome: Progressing Goal: Will remain free from infection Outcome: Progressing Goal: Diagnostic test results will improve Outcome: Progressing

## 2024-01-10 NOTE — Progress Notes (Signed)
 Physical Therapy Treatment Patient Details Name: Victoria Delgado MRN: [id number] DOB: 01-Aug-1941 Today's Date: 01/10/2024   History of Present Illness Patient is 83 y.o. female  presents with complaints of worsening shortness of breath on 01/05/24. Pt  recently hospitalized from 1/25-1/26 with exertional dyspnea.  She had been sent home on 4 L of oxygen at that time. CTA of the chest noted no signs of a PE, cardiomegaly with pulmonary edema with small moderate right and small left layering pleural effusions, and dependent bibasilar atelectasis or consolidation with few new areas throughout both lungs. PMH significant of HT, CAD, PAF chronic anticoagulation, HOCM, severe COPD, OSA/OHS on CPAP, morbid obesity, GERD, RSV infectino complicated by NSTEMI in 2024, AS s/p TAVR.    PT Comments  Today's session focused on gait training. Pt was able to increase her ambulatory distance using RW with  CGA and 3L O2 via . Pt reported DOE 2/4 and modified RPE 5/10 following gait. Pt would benefit from a rollator to be able to sit when fatigued and allow her to conserve energy with functional mobility. Reviewed BLE HEP she could perform while seated or supine to maintain ROM/strength. Encouraged her to continue using the incentive spirometer 10 times every hour. Pt will continue to benefit from acute skilled PT to increase her independence and safety with mobility to allow d/c Home with HHPT.       If plan is discharge home, recommend the following: Assistance with cooking/housework;Direct supervision/assist for medications management;Assist for transportation;Help with stairs or ramp for entrance;A little help with walking and/or transfers;A little help with bathing/dressing/bathroom   Can travel by private Psychologist, Clinical (4 wheels)    Recommendations for Other Services       Precautions / Restrictions Precautions Precautions: Fall Precaution Comments: monitor  O2 Restrictions Weight Bearing Restrictions Per Provider Order: No     Mobility  Bed Mobility               General bed mobility comments: Not assessed. Pt seated in recliner chair at start of session.    Transfers Overall transfer level: Needs assistance Equipment used: None Transfers: Sit to/from Stand Sit to Stand: Supervision           General transfer comment: Pt performs STS from recliner chair without use of AD. She will push up from armrest and obtain standing, where she will then use the RW.    Ambulation/Gait Ambulation/Gait assistance: Contact guard assist Gait Distance (Feet): 60 Feet Assistive device: Rolling walker (2 wheels) Gait Pattern/deviations: Step-through pattern, Decreased stride length       General Gait Details: Pt ambulated with a reciprocal gait pattern, decrease stride length, and decreased speed. She displayed good insight into endurance, turning back in the hallway to return to her room d/t DOE.   Stairs             Wheelchair Mobility     Tilt Bed    Modified Rankin (Stroke Patients Only)       Balance Overall balance assessment: Mild deficits observed, not formally tested                                          Cognition Arousal: Alert Behavior During Therapy: WFL for tasks assessed/performed Overall Cognitive Status: Within Functional Limits for tasks assessed  Exercises      General Comments General comments (skin integrity, edema, etc.): Pleth reading was inconsistent and did not show throughout majority of gait. Pt ambulated on 3L O2. SpO2 90-96%.      Pertinent Vitals/Pain Pain Assessment Pain Assessment: No/denies pain    Home Living                          Prior Function            PT Goals (current goals can now be found in the care plan section) Acute Rehab PT Goals Patient Stated Goal: Decrease need  for O2 Progress towards PT goals: Progressing toward goals    Frequency    Min 1X/week      PT Plan      Co-evaluation              AM-PAC PT 6 Clicks Mobility   Outcome Measure  Help needed turning from your back to your side while in a flat bed without using bedrails?: A Lot Help needed moving from lying on your back to sitting on the side of a flat bed without using bedrails?: A Lot Help needed moving to and from a bed to a chair (including a wheelchair)?: A Little Help needed standing up from a chair using your arms (e.g., wheelchair or bedside chair)?: A Little Help needed to walk in hospital room?: A Little Help needed climbing 3-5 steps with a railing? : A Lot 6 Click Score: 15    End of Session Equipment Utilized During Treatment: Gait belt;Oxygen Activity Tolerance: Patient tolerated treatment well (DOE 2/4 and RPE 5/10) Patient left: in chair;with call bell/phone within reach;with family/visitor present Nurse Communication: Mobility status PT Visit Diagnosis: Unsteadiness on feet (R26.81);Other abnormalities of gait and mobility (R26.89);Difficulty in walking, not elsewhere classified (R26.2);Muscle weakness (generalized) (M62.81)     Time: 9066-9047 PT Time Calculation (min) (ACUTE ONLY): 19 min  Charges:    $Gait Training: 8-22 mins                       Randall SAUNDERS, PT, DPT Acute Rehabilitation Services Office: [phone] Secure Chat Preferred   Delon CHRISTELLA Callander 01/10/2024, 11:50 AM

## 2024-01-11 DIAGNOSIS — J9621 Acute and chronic respiratory failure with hypoxia: Secondary | ICD-10-CM | POA: Diagnosis not present

## 2024-01-11 DIAGNOSIS — I422 Other hypertrophic cardiomyopathy: Secondary | ICD-10-CM

## 2024-01-11 DIAGNOSIS — I050 Rheumatic mitral stenosis: Secondary | ICD-10-CM | POA: Diagnosis not present

## 2024-01-11 DIAGNOSIS — I5033 Acute on chronic diastolic (congestive) heart failure: Secondary | ICD-10-CM | POA: Diagnosis not present

## 2024-01-11 LAB — CBC
HCT: 27.3 % — ABNORMAL LOW (ref 36.0–46.0)
Hemoglobin: 8.6 g/dL — ABNORMAL LOW (ref 12.0–15.0)
MCH: 28.9 pg (ref 26.0–34.0)
MCHC: 31.5 g/dL (ref 30.0–36.0)
MCV: 91.6 fL (ref 80.0–100.0)
Platelets: 356 10*3/uL (ref 150–400)
RBC: 2.98 MIL/uL — ABNORMAL LOW (ref 3.87–5.11)
RDW: 19.9 % — ABNORMAL HIGH (ref 11.5–15.5)
WBC: 9.7 10*3/uL (ref 4.0–10.5)
nRBC: 0.8 % — ABNORMAL HIGH (ref 0.0–0.2)

## 2024-01-11 LAB — COMPREHENSIVE METABOLIC PANEL
ALT: 16 U/L (ref 0–44)
AST: 20 U/L (ref 15–41)
Albumin: 3 g/dL — ABNORMAL LOW (ref 3.5–5.0)
Alkaline Phosphatase: 63 U/L (ref 38–126)
Anion gap: 15 (ref 5–15)
BUN: 29 mg/dL — ABNORMAL HIGH (ref 8–23)
CO2: 31 mmol/L (ref 22–32)
Calcium: 9.1 mg/dL (ref 8.9–10.3)
Chloride: 96 mmol/L — ABNORMAL LOW (ref 98–111)
Creatinine, Ser: 1.04 mg/dL — ABNORMAL HIGH (ref 0.44–1.00)
GFR, Estimated: 54 mL/min — ABNORMAL LOW (ref >60)
Glucose, Bld: 209 mg/dL — ABNORMAL HIGH (ref 70–99)
Potassium: 3.4 mmol/L — ABNORMAL LOW (ref 3.5–5.1)
Sodium: 142 mmol/L (ref 135–145)
Total Bilirubin: 0.8 mg/dL (ref 0.0–1.2)
Total Protein: 5.8 g/dL — ABNORMAL LOW (ref 6.5–8.1)

## 2024-01-11 LAB — MAGNESIUM: Magnesium: 2.2 mg/dL (ref 1.7–2.4)

## 2024-01-11 MED ORDER — METOPROLOL TARTRATE 12.5 MG HALF TABLET
12.5000 mg | ORAL_TABLET | Freq: Two times a day (BID) | ORAL | Status: AC
  Administered 2024-01-11 – 2024-01-13 (×3): 12.5 mg via ORAL
  Filled 2024-01-11 (×4): qty 1

## 2024-01-11 MED ORDER — POTASSIUM CHLORIDE CRYS ER 20 MEQ PO TBCR
40.0000 meq | EXTENDED_RELEASE_TABLET | Freq: Once | ORAL | Status: AC
  Administered 2024-01-11: 40 meq via ORAL
  Filled 2024-01-11: qty 2

## 2024-01-11 MED ORDER — METOPROLOL TARTRATE 25 MG PO TABS
25.0000 mg | ORAL_TABLET | Freq: Two times a day (BID) | ORAL | Status: DC
  Filled 2024-01-11: qty 1

## 2024-01-11 MED ORDER — FUROSEMIDE 10 MG/ML IJ SOLN
40.0000 mg | Freq: Three times a day (TID) | INTRAMUSCULAR | Status: DC
Start: 2024-01-12 — End: 2024-01-12
  Administered 2024-01-12: 40 mg via INTRAVENOUS
  Filled 2024-01-11: qty 4

## 2024-01-11 MED ORDER — FUROSEMIDE 10 MG/ML IJ SOLN
40.0000 mg | Freq: Once | INTRAMUSCULAR | Status: AC
  Administered 2024-01-11: 40 mg via INTRAVENOUS
  Filled 2024-01-11: qty 4

## 2024-01-11 NOTE — Progress Notes (Signed)
 [facility] PROGRESS NOTE   Victoria Delgado FMW:[id number] DOB: 10/06/1941 DOA: 01/05/2024  PCP: Valentin Skates, DO  Brief History: 83 y.o. female with medical history significant of hypertension, CAD, paroxysmal atrial fibrillation chronic anticoagulation, HOCM, severe COPD, OSA/OHS on CPAP, morbid obesity, and GERD presents with complaints of worsening shortness of breath.  Currently admitted for acute on chronic hypoxic respiratory failure secondary to diastolic CHF exacerbation.      Subjective/Interval History: Overnight reported to the RN that she has dark tarry stools    Assessment/Plan:  Acute on chronic respiratory failure with hypoxia and hypercapnia Suspected diastolic congestive heart failure exacerbation Bilateral pleural effusions Patient presented with complaints of worsening shortness of breath and orthopnea.   Just recently discharged from the hospital for dyspnea on exertion on 4 L of nasal cannula oxygen.  Denies any significant change in weight.  Noted to be hypoxic into the 80s even on 4 L with ambulation.   CT angiogram of the chest obtained which noted cardiomegaly with pulmonary edema and layering pleural effusions.  BNP was 241.7.   Echocardiogram was done which showed EF of 70 to 75%.  Diastolic function could not be evaluated.  Right ventricular systolic function was normal.  Elevated pulmonary artery systolic pressure was noted. Patient was placed on IV diuretics-- per cards   Monitor blood pressures closely. -continue to wean O2 as able-- off O2 at rest; suspect will go back on O2 at home with exertion but does need to be able to complete ADLs -PT/OT   Possible pneumonia Bronchitis CT angiogram of the chest:  noted bilateral opacities with superimposed infection cannot be ruled out.  Patient was started on antibiotics of Rocephin  and doxycycline  x 5 days For now we will continue with antibiotics.  Nebulizer treatments.  Also started on  steroids. -Mucinex    Elevated troponin Acute on chronic.  High-sensitivity troponin 31-> 30.  Normal troponin slightly higher than previous but still essentially flat.  Suspect this secondary to demand  Hypokalemia -replete  Normocytic anemia Hemoglobin was 14 back in October.  Her current hemoglobin is definitely lower than her baseline.   -Check FOBT.  - Noted to be on anticoagulation- if heme + will get GI consult   Paroxysmal atrial fibrillation anticoagulation Patient currently in sinus rhythm -Continue Eliquis   Enlarged mediastinal lymph nodes Incidentally noted on CT angiogram.  Will need reevaluation in a few weeks.  Enlargement of lymph nodes likely reactive.  Prolonged QT interval On admission QT C noted to be 498.     Essential hypertension Patient's blood pressures had been noted to be soft during last hospitalization for which metoprolol  succinate had been recommended to be discontinued.  Blood pressure is stable.  Not on any blood pressure lowering agents apart from the diuretics.   Diabetes mellitus type 2 with hyperglycemia without long-term use of insulin  - hemoglobin A1c was noted to be 7.1 when checked 12/07/2022.   Hyperlipidemia -Continue Crestor  and Zetia    GERD -Continue pharmacy substitution of Pepcid  for omeprazole  as patient has allergy to recommended substitution Protonix    OSA  --? Possible O2 in CPAP at night. Message sent to [facility] and Dr. Kassie -resp therapy for overnight pulse ox   Morbid obesity Estimated body mass index is 41.06 kg/m as calculated from the following:   Height as of this encounter: 5' 8 (1.727 m).   Weight as of this encounter: 122.5 kg.      DVT prophylaxis: Eliquis  CODE STATUS: full Family communication:  Discussed with patient.  No family at bedside. Disposition: Hopefully return home when improved suspect several more days   Status is: Inpatient Remains inpatient appropriate because: Acute CHF,  bronchitis      Medications: Scheduled:  amiodarone   100 mg Oral Daily   apixaban   5 mg Oral BID   arformoterol   15 mcg Nebulization BID   atropine   1 drop Right Eye Daily   budesonide  (PULMICORT ) nebulizer solution  0.5 mg Nebulization BID   ezetimibe   10 mg Oral Daily   [START ON 01/12/2024] furosemide   40 mg Intravenous TID   furosemide   40 mg Intravenous Once   metoprolol  tartrate  12.5 mg Oral BID   omeprazole   40 mg Oral Daily   predniSONE   50 mg Oral Q breakfast   rosuvastatin   20 mg Oral Daily   sodium chloride  flush  3 mL Intravenous Q12H   Continuous:   PRN:acetaminophen , ipratropium, levalbuterol , polyvinyl alcohol      Objective:  Vital Signs  Vitals:   01/11/24 0402 01/11/24 0538 01/11/24 0842 01/11/24 1215  BP: (!) 119/46  110/66 (!) 127/50  Pulse: 64 62 94 76  Resp: 16 16 20 20   Temp: 97.9 F (36.6 C)  97.8 F (36.6 C) 98.2 F (36.8 C)  TempSrc: Axillary  Oral Oral  SpO2: 100% 100% 90% 92%  Weight:  122.5 kg    Height:        Intake/Output Summary (Last 24 hours) at 01/11/2024 1257 Last data filed at 01/11/2024 0800 Gross per 24 hour  Intake 150 ml  Output 1250 ml  Net -1100 ml   Filed Weights   01/09/24 0500 01/10/24 0330 01/11/24 0538  Weight: 123.8 kg 122.6 kg 122.5 kg     General: Appearance:    Severely obese female in no acute distress     Lungs:     Clear to auscultation bilaterally, respirations unlabored  Heart:    Normal heart rate.      MS:   All extremities are intact.   Neurologic:   Awake, alert      Lab Results:  Data Reviewed: I have personally reviewed following labs and reports of the imaging studies  CBC: Recent Labs  Lab 01/05/24 0540 01/05/24 0558 01/07/24 0739 01/08/24 0325 01/09/24 0759 01/10/24 0639 01/11/24 0255  WBC 10.7*   < > 12.3* 11.1* 10.5 10.8* 9.7  NEUTROABS 8.5*  --  9.6*  --   --   --   --   HGB 10.2*   < > 8.9* 8.9* 8.9* 8.7* 8.6*  HCT 32.1*   < > 27.6* 28.0* 28.3* 27.5* 27.3*  MCV  90.2   < > 91.7 92.1 91.6 90.5 91.6  PLT 377   < > 417* 399 360 373 356   < > = values in this interval not displayed.    Basic Metabolic Panel: Recent Labs  Lab 01/07/24 0739 01/08/24 0325 01/09/24 0759 01/10/24 0639 01/11/24 0255  NA 140 138 140 141 142  K 4.5 3.8 3.6 3.2* 3.4*  CL 100 98 98 98 96*  CO2 30 30 29  32 31  GLUCOSE 188* 215* 166* 159* 209*  BUN 23 22 28* 26* 29*  CREATININE 0.93 0.81 1.14* 0.95 1.04*  CALCIUM  8.7* 8.5* 8.8* 8.7* 9.1  MG  --   --   --   --  2.2    GFR: Estimated Creatinine Clearance: 59.5 mL/min (A) (by C-G formula based on SCr of 1.04 mg/dL (H)).  Liver Function  Tests: Recent Labs  Lab 01/07/24 0739 01/08/24 0325 01/09/24 0759 01/10/24 0639 01/11/24 0255  AST 20 18 21 18 20   ALT 14 15 13 16 16   ALKPHOS 68 63 65 59 63  BILITOT 0.5 0.6 0.7 0.8 0.8  PROT 5.7* 5.7* 5.8* 5.7* 5.8*  ALBUMIN 2.7* 2.8* 3.0* 2.9* 3.0*     BNP (last 3 results) Recent Labs    01/15/23 1343  PROBNP 302.0*    HbA1C: No results for input(s): HGBA1C in the last 72 hours.    Recent Results (from the past 240 hours)  Resp panel by RT-PCR (RSV, Flu A&B, Covid) Anterior Nasal Swab     Status: None   Collection Time: 01/05/24  5:40 AM   Specimen: Anterior Nasal Swab  Result Value Ref Range Status   SARS Coronavirus 2 by RT PCR NEGATIVE NEGATIVE Final   Influenza A by PCR NEGATIVE NEGATIVE Final   Influenza B by PCR NEGATIVE NEGATIVE Final    Comment: (NOTE) The Xpert Xpress SARS-CoV-2/FLU/RSV plus assay is intended as an aid in the diagnosis of influenza from Nasopharyngeal swab specimens and should not be used as a sole basis for treatment. Nasal washings and aspirates are unacceptable for Xpert Xpress SARS-CoV-2/FLU/RSV testing.  Fact Sheet for Patients: bloggercourse.com  Fact Sheet for Healthcare Providers: seriousbroker.it  This test is not yet approved or cleared by the [place]  FDA  and has been authorized for detection and/or diagnosis of SARS-CoV-2 by FDA under an Emergency Use Authorization (EUA). This EUA will remain in effect (meaning this test can be used) for the duration of the COVID-19 declaration under Section 564(b)(1) of the Act, 21 U.S.C. section 360bbb-3(b)(1), unless the authorization is terminated or revoked.     Resp Syncytial Virus by PCR NEGATIVE NEGATIVE Final    Comment: (NOTE) Fact Sheet for Patients: bloggercourse.com  Fact Sheet for Healthcare Providers: seriousbroker.it  This test is not yet approved or cleared by the [place]  FDA and has been authorized for detection and/or diagnosis of SARS-CoV-2 by FDA under an Emergency Use Authorization (EUA). This EUA will remain in effect (meaning this test can be used) for the duration of the COVID-19 declaration under Section 564(b)(1) of the Act, 21 U.S.C. section 360bbb-3(b)(1), unless the authorization is terminated or revoked.  Performed at [facility], [address] [place], [place] [address]       Radiology Studies: No results found.      LOS: 6 days   Harlene RAYMOND Bowl  [facility] Pager on [email or web address]  01/11/2024, 12:57 PM

## 2024-01-11 NOTE — Progress Notes (Signed)
 Progress Note  Patient Name: Victoria Delgado Date of Encounter: 01/11/2024  Primary Cardiologist: Lynwood Schilling, MD  Subjective   No acute events overnight.  SOB improving.  Inpatient Medications    Scheduled Meds:  amiodarone   100 mg Oral Daily   apixaban   5 mg Oral BID   arformoterol   15 mcg Nebulization BID   atropine   1 drop Right Eye Daily   budesonide  (PULMICORT ) nebulizer solution  0.5 mg Nebulization BID   ezetimibe   10 mg Oral Daily   furosemide   80 mg Intravenous BID   omeprazole   40 mg Oral Daily   potassium chloride   40 mEq Oral Once   predniSONE   50 mg Oral Q breakfast   rosuvastatin   20 mg Oral Daily   sodium chloride  flush  3 mL Intravenous Q12H   Continuous Infusions:  PRN Meds: acetaminophen , ipratropium, levalbuterol , polyvinyl alcohol    Vital Signs    Vitals:   01/10/24 2317 01/11/24 0402 01/11/24 0538 01/11/24 0842  BP: (!) 122/46 (!) 119/46  110/66  Pulse: 73 64 62 94  Resp: 20 16 16 20   Temp: 97.8 F (36.6 C) 97.9 F (36.6 C)  97.8 F (36.6 C)  TempSrc: Oral Axillary  Oral  SpO2: 98% 100% 100% 90%  Weight:   122.5 kg   Height:        Intake/Output Summary (Last 24 hours) at 01/11/2024 1022 Last data filed at 01/11/2024 0800 Gross per 24 hour  Intake 150 ml  Output 1250 ml  Net -1100 ml   Filed Weights   01/09/24 0500 01/10/24 0330 01/11/24 0538  Weight: 123.8 kg 122.6 kg 122.5 kg    Telemetry     Personally reviewed.  ECG    Not performed today  Physical Exam   GEN: No acute distress.   Neck: Not examined due to body habitus Cardiac: RRR, no murmur, rub, or gallop.  Respiratory: Nonlabored. Clear to auscultation bilaterally. GI: Soft, nontender, bowel sounds present. MS: No edema; No deformity. Neuro:  Nonfocal. Psych: Alert and oriented x 3. Normal affect.  Labs    Chemistry Recent Labs  Lab 01/09/24 0759 01/10/24 0639 01/11/24 0255  NA 140 141 142  K 3.6 3.2* 3.4*  CL 98 98 96*  CO2 29 32 31  GLUCOSE  166* 159* 209*  BUN 28* 26* 29*  CREATININE 1.14* 0.95 1.04*  CALCIUM  8.8* 8.7* 9.1  PROT 5.8* 5.7* 5.8*  ALBUMIN 3.0* 2.9* 3.0*  AST 21 18 20   ALT 13 16 16   ALKPHOS 65 59 63  BILITOT 0.7 0.8 0.8  GFRNONAA 49* >60 54*  ANIONGAP 13 11 15      Hematology Recent Labs  Lab 01/09/24 0759 01/10/24 0639 01/11/24 0255  WBC 10.5 10.8* 9.7  RBC 3.09* 3.04* 2.98*  HGB 8.9* 8.7* 8.6*  HCT 28.3* 27.5* 27.3*  MCV 91.6 90.5 91.6  MCH 28.8 28.6 28.9  MCHC 31.4 31.6 31.5  RDW 19.9* 20.1* 19.9*  PLT 360 373 356    Cardiac Enzymes Recent Labs  Lab 12/28/23 1536 12/28/23 1752 01/05/24 0540 01/05/24 0851  TROPONINIHS 21* 18* 31* 30*    BNP Recent Labs  Lab 01/05/24 0540 01/07/24 0739  BNP 241.7* 334.8*     DDimerNo results for input(s): DDIMER in the last 168 hours.   Radiology    No results found.   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  Will cut back on the Lasix  dose.  Switch to IV Lasix  40 mg TID.  Will need to start beta-blocker due to elevated mean mitral PG 90 mmHg and HCM.  Start metoprolol  tartrate 25 mg twice daily.  Goal HR 60 bpm.  Will need to repeat limited echocardiogram once HR is around 60 bpm.  Continue amiodarone  200 mg once daily and Eliquis  5 mg twice daily.  Continue rosuvastatin  20 mg nightly and Zetia  10 mg once daily for HLD management.  Signed, Anyra Kaufman SHAUNNA Maywood, MD  01/11/2024, 10:22 AM

## 2024-01-11 NOTE — Progress Notes (Signed)
 Mobility Specialist Progress Note:   01/11/24 1116  Mobility  Activity Ambulated with assistance in hallway  Level of Assistance Contact guard assist, steadying assist  Assistive Device Front wheel walker  Distance Ambulated (ft) 150 ft  Activity Response Tolerated well  Mobility Referral Yes  Mobility visit 1 Mobility  Mobility Specialist Start Time (ACUTE ONLY) 1116  Mobility Specialist Stop Time (ACUTE ONLY) 1126  Mobility Specialist Time Calculation (min) (ACUTE ONLY) 10 min   Pt received in chair, agreeable to mobility session. When first standing at chairside, pt had LOB but was able to self correct. Gait belt donned for safety. Ambulated in hallway on 2L, unable to get accurate pleth throughout. Very slight audible SOB, however pt denies SOB and dizziness. Took 1 standing rest break during session before returning back to room. Sitting in chair comfortably. Upon arrival to room, pt reports that typically when she rests immediately after from walking she has severe SOB and wheezing,  and that this subsides after a few minutes. She did experienced this phenomenon while MS was present in room, SpO2 100% on 2L throughout. Once audible wheezing recovered, pt left with all needs met, call bell in reach.   Victoria Delgado Mobility Specialist Please contact via Special Educational Needs Teacher or  Rehab office at [phone]

## 2024-01-12 DIAGNOSIS — K921 Melena: Secondary | ICD-10-CM | POA: Diagnosis not present

## 2024-01-12 DIAGNOSIS — J9621 Acute and chronic respiratory failure with hypoxia: Secondary | ICD-10-CM | POA: Diagnosis not present

## 2024-01-12 DIAGNOSIS — I050 Rheumatic mitral stenosis: Secondary | ICD-10-CM | POA: Diagnosis not present

## 2024-01-12 DIAGNOSIS — D62 Acute posthemorrhagic anemia: Secondary | ICD-10-CM | POA: Diagnosis not present

## 2024-01-12 DIAGNOSIS — K922 Gastrointestinal hemorrhage, unspecified: Secondary | ICD-10-CM

## 2024-01-12 DIAGNOSIS — I480 Paroxysmal atrial fibrillation: Secondary | ICD-10-CM | POA: Diagnosis not present

## 2024-01-12 DIAGNOSIS — I5033 Acute on chronic diastolic (congestive) heart failure: Secondary | ICD-10-CM | POA: Diagnosis not present

## 2024-01-12 LAB — COMPREHENSIVE METABOLIC PANEL
ALT: 17 U/L (ref 0–44)
AST: 18 U/L (ref 15–41)
Albumin: 2.9 g/dL — ABNORMAL LOW (ref 3.5–5.0)
Alkaline Phosphatase: 57 U/L (ref 38–126)
Anion gap: 13 (ref 5–15)
BUN: 28 mg/dL — ABNORMAL HIGH (ref 8–23)
CO2: 29 mmol/L (ref 22–32)
Calcium: 8.9 mg/dL (ref 8.9–10.3)
Chloride: 98 mmol/L (ref 98–111)
Creatinine, Ser: 0.9 mg/dL (ref 0.44–1.00)
GFR, Estimated: >60 mL/min (ref >60)
Glucose, Bld: 194 mg/dL — ABNORMAL HIGH (ref 70–99)
Potassium: 3.4 mmol/L — ABNORMAL LOW (ref 3.5–5.1)
Sodium: 140 mmol/L (ref 135–145)
Total Bilirubin: 0.7 mg/dL (ref 0.0–1.2)
Total Protein: 5.4 g/dL — ABNORMAL LOW (ref 6.5–8.1)

## 2024-01-12 LAB — CBC
HCT: 26.3 % — ABNORMAL LOW (ref 36.0–46.0)
Hemoglobin: 8.2 g/dL — ABNORMAL LOW (ref 12.0–15.0)
MCH: 28.7 pg (ref 26.0–34.0)
MCHC: 31.2 g/dL (ref 30.0–36.0)
MCV: 92 fL (ref 80.0–100.0)
Platelets: 323 10*3/uL (ref 150–400)
RBC: 2.86 MIL/uL — ABNORMAL LOW (ref 3.87–5.11)
RDW: 19.6 % — ABNORMAL HIGH (ref 11.5–15.5)
WBC: 10.6 10*3/uL — ABNORMAL HIGH (ref 4.0–10.5)
nRBC: 0.8 % — ABNORMAL HIGH (ref 0.0–0.2)

## 2024-01-12 LAB — OCCULT BLOOD X 1 CARD TO LAB, STOOL: Fecal Occult Bld: POSITIVE — AB

## 2024-01-12 MED ORDER — FUROSEMIDE 10 MG/ML IJ SOLN
40.0000 mg | Freq: Two times a day (BID) | INTRAMUSCULAR | Status: DC
Start: 2024-01-12 — End: 2024-01-13
  Administered 2024-01-12 – 2024-01-13 (×3): 40 mg via INTRAVENOUS
  Filled 2024-01-12 (×3): qty 4

## 2024-01-12 MED ORDER — POTASSIUM CHLORIDE CRYS ER 20 MEQ PO TBCR
40.0000 meq | EXTENDED_RELEASE_TABLET | Freq: Three times a day (TID) | ORAL | Status: AC
  Administered 2024-01-12 – 2024-01-13 (×6): 40 meq via ORAL
  Filled 2024-01-12: qty 4
  Filled 2024-01-12 (×5): qty 2

## 2024-01-12 MED ORDER — OMEPRAZOLE 20 MG PO CPDR
40.0000 mg | DELAYED_RELEASE_CAPSULE | Freq: Two times a day (BID) | ORAL | Status: DC
  Administered 2024-01-12 – 2024-01-14 (×4): 40 mg via ORAL
  Filled 2024-01-12 (×5): qty 2

## 2024-01-12 MED ORDER — SIMETHICONE 80 MG PO CHEW
80.0000 mg | CHEWABLE_TABLET | Freq: Four times a day (QID) | ORAL | Status: DC | PRN
  Administered 2024-01-12: 80 mg via ORAL
  Filled 2024-01-12: qty 1

## 2024-01-12 NOTE — Progress Notes (Signed)
 Mobility Specialist Progress Note:    01/12/24 1410  Mobility  Activity Ambulated with assistance in hallway;Ambulated with assistance in room  Level of Assistance Contact guard assist, steadying assist  Assistive Device Front wheel walker  Distance Ambulated (ft) 40 ft  Activity Response Tolerated well  Mobility Referral Yes  Mobility visit 1 Mobility  Mobility Specialist Start Time (ACUTE ONLY) 1400  Mobility Specialist Stop Time (ACUTE ONLY) 1410  Mobility Specialist Time Calculation (min) (ACUTE ONLY) 10 min   Pt received in chair, agreeable to mobility session. Ambulated in hallway with RW and CGA for safety. Tolerated well, SpO2 >90% on RA, HR 103 bpm. During session, pt c/o dizziness. Returned pt back to room, sitting up in chair. Pt experienced SOB and wheezing after mobility. Recovered, HR 80 bpm, SpO2 93% on RA. Left pt with all needs met.   Victoria Delgado Mobility Specialist Please contact via Special Educational Needs Teacher or  Rehab office at [phone]

## 2024-01-12 NOTE — Progress Notes (Signed)
 [facility] PROGRESS NOTE   Victoria Delgado FMW:[id number] DOB: July 01, 1941 DOA: 01/05/2024  PCP: Valentin Skates, DO  Brief History: 83 y.o. female with medical history significant of hypertension, CAD, paroxysmal atrial fibrillation chronic anticoagulation, HOCM, severe COPD, OSA/OHS on CPAP, morbid obesity, and GERD presents with complaints of worsening shortness of breath.  Currently admitted for acute on chronic hypoxic respiratory failure secondary to diastolic CHF exacerbation.      Subjective/Interval History: Breathing improved    Assessment/Plan:  Acute on chronic respiratory failure with hypoxia and hypercapnia Suspected diastolic congestive heart failure exacerbation Bilateral pleural effusions Patient presented with complaints of worsening shortness of breath and orthopnea.   Just recently discharged from the hospital for dyspnea on exertion on 4 L of nasal cannula oxygen.  Denies any significant change in weight.  Noted to be hypoxic into the 80s even on 4 L with ambulation.   CT angiogram of the chest obtained which noted cardiomegaly with pulmonary edema and layering pleural effusions.  BNP was 241.7.   Echocardiogram was done which showed EF of 70 to 75%.  Diastolic function could not be evaluated.  Right ventricular systolic function was normal.  Elevated pulmonary artery systolic pressure was noted. Patient was placed on IV diuretics-- per cards   Monitor blood pressures closely. -continue to wean O2 as able-- off O2 at rest; suspect will go back on O2 at home with exertion but does need to be able to complete ADLs -PT/OT   Possible pneumonia Bronchitis CT angiogram of the chest:  noted bilateral opacities with superimposed infection cannot be ruled out.  Patient was started on antibiotics of Rocephin  and doxycycline  x 5 days For now we will continue with antibiotics.  Nebulizer treatments.  Also started on steroids. -Mucinex    Elevated troponin Acute on  chronic.  High-sensitivity troponin 31-> 30.  Normal troponin slightly higher than previous but still essentially flat.  Suspect this secondary to demand  Hypokalemia -replete  Normocytic anemia Hemoglobin was 14 back in October.  Her current hemoglobin is definitely lower than her baseline.   -heme + -GI consult   Paroxysmal atrial fibrillation anticoagulation Patient currently in sinus rhythm -hold eliquis  for now  Enlarged mediastinal lymph nodes Incidentally noted on CT angiogram.  Will need reevaluation in a few weeks.  Enlargement of lymph nodes likely reactive.  Prolonged QT interval On admission QT C noted to be 498.     Essential hypertension Patient's blood pressures had been noted to be soft during last hospitalization for which metoprolol  succinate had been recommended to be discontinued.  Blood pressure is stable.  Not on any blood pressure lowering agents apart from the diuretics.   Diabetes mellitus type 2 with hyperglycemia without long-term use of insulin  - hemoglobin A1c was noted to be 7.1 when checked 12/07/2022.   Hyperlipidemia -Continue Crestor  and Zetia    GERD -Continue pharmacy substitution of Pepcid  for omeprazole  as patient has allergy to recommended substitution Protonix    OSA  --? Possible O2 in CPAP at night. Message sent to [facility] and Dr. Kassie -resp therapy for overnight pulse ox   Morbid obesity Estimated body mass index is 41.06 kg/m as calculated from the following:   Height as of this encounter: 5' 8 (1.727 m).   Weight as of this encounter: 122.5 kg.      DVT prophylaxis: Eliquis  on hold, SCDs CODE STATUS: full Family communication: Discussed with patient.  No family at bedside. Disposition: Hopefully return home when improved suspect several  more days   Status is: Inpatient Remains inpatient appropriate because: Acute CHF, bronchitis      Medications: Scheduled:  amiodarone   100 mg Oral Daily   arformoterol   15 mcg  Nebulization BID   atropine   1 drop Right Eye Daily   budesonide  (PULMICORT ) nebulizer solution  0.5 mg Nebulization BID   ezetimibe   10 mg Oral Daily   furosemide   40 mg Intravenous BID   metoprolol  tartrate  12.5 mg Oral BID   omeprazole   40 mg Oral BID AC   potassium chloride   40 mEq Oral TID   predniSONE   50 mg Oral Q breakfast   rosuvastatin   20 mg Oral Daily   sodium chloride  flush  3 mL Intravenous Q12H   Continuous:   PRN:acetaminophen , ipratropium, levalbuterol , polyvinyl alcohol , simethicone      Objective:  Vital Signs  Vitals:   01/12/24 0310 01/12/24 0400 01/12/24 0740 01/12/24 1038  BP:  (!) 107/50 (!) 105/57 (!) 105/56  Pulse: (!) 59 62 65 77  Resp:  19 18   Temp:  97.7 F (36.5 C) 97.8 F (36.6 C) 98.1 F (36.7 C)  TempSrc:  Oral Oral Oral  SpO2: 98% 97%  95%  Weight:      Height:        Intake/Output Summary (Last 24 hours) at 01/12/2024 1101 Last data filed at 01/12/2024 0901 Gross per 24 hour  Intake 200 ml  Output 553 ml  Net -353 ml   Filed Weights   01/09/24 0500 01/10/24 0330 01/11/24 0538  Weight: 123.8 kg 122.6 kg 122.5 kg     General: Appearance:    Severely obese female in no acute distress     Lungs:     Clear to auscultation bilaterally, respirations unlabored  Heart:    Normal heart rate.      MS:   All extremities are intact.   Neurologic:   Awake, alert      Lab Results:  Data Reviewed: I have personally reviewed following labs and reports of the imaging studies  CBC: Recent Labs  Lab 01/07/24 0739 01/08/24 0325 01/09/24 0759 01/10/24 0639 01/11/24 0255 01/12/24 0333  WBC 12.3* 11.1* 10.5 10.8* 9.7 10.6*  NEUTROABS 9.6*  --   --   --   --   --   HGB 8.9* 8.9* 8.9* 8.7* 8.6* 8.2*  HCT 27.6* 28.0* 28.3* 27.5* 27.3* 26.3*  MCV 91.7 92.1 91.6 90.5 91.6 92.0  PLT 417* 399 360 373 356 323    Basic Metabolic Panel: Recent Labs  Lab 01/08/24 0325 01/09/24 0759 01/10/24 0639 01/11/24 0255 01/12/24 0333  NA  138 140 141 142 140  K 3.8 3.6 3.2* 3.4* 3.4*  CL 98 98 98 96* 98  CO2 30 29 32 31 29  GLUCOSE 215* 166* 159* 209* 194*  BUN 22 28* 26* 29* 28*  CREATININE 0.81 1.14* 0.95 1.04* 0.90  CALCIUM  8.5* 8.8* 8.7* 9.1 8.9  MG  --   --   --  2.2  --     GFR: Estimated Creatinine Clearance: 68.7 mL/min (by C-G formula based on SCr of 0.9 mg/dL).  Liver Function Tests: Recent Labs  Lab 01/08/24 0325 01/09/24 0759 01/10/24 0639 01/11/24 0255 01/12/24 0333  AST 18 21 18 20 18   ALT 15 13 16 16 17   ALKPHOS 63 65 59 63 57  BILITOT 0.6 0.7 0.8 0.8 0.7  PROT 5.7* 5.8* 5.7* 5.8* 5.4*  ALBUMIN 2.8* 3.0* 2.9* 3.0* 2.9*  BNP (last 3 results) Recent Labs    01/15/23 1343  PROBNP 302.0*    HbA1C: No results for input(s): HGBA1C in the last 72 hours.    Recent Results (from the past 240 hours)  Resp panel by RT-PCR (RSV, Flu A&B, Covid) Anterior Nasal Swab     Status: None   Collection Time: 01/05/24  5:40 AM   Specimen: Anterior Nasal Swab  Result Value Ref Range Status   SARS Coronavirus 2 by RT PCR NEGATIVE NEGATIVE Final   Influenza A by PCR NEGATIVE NEGATIVE Final   Influenza B by PCR NEGATIVE NEGATIVE Final    Comment: (NOTE) The Xpert Xpress SARS-CoV-2/FLU/RSV plus assay is intended as an aid in the diagnosis of influenza from Nasopharyngeal swab specimens and should not be used as a sole basis for treatment. Nasal washings and aspirates are unacceptable for Xpert Xpress SARS-CoV-2/FLU/RSV testing.  Fact Sheet for Patients: bloggercourse.com  Fact Sheet for Healthcare Providers: seriousbroker.it  This test is not yet approved or cleared by the [place]  FDA and has been authorized for detection and/or diagnosis of SARS-CoV-2 by FDA under an Emergency Use Authorization (EUA). This EUA will remain in effect (meaning this test can be used) for the duration of the COVID-19 declaration under Section 564(b)(1) of  the Act, 21 U.S.C. section 360bbb-3(b)(1), unless the authorization is terminated or revoked.     Resp Syncytial Virus by PCR NEGATIVE NEGATIVE Final    Comment: (NOTE) Fact Sheet for Patients: bloggercourse.com  Fact Sheet for Healthcare Providers: seriousbroker.it  This test is not yet approved or cleared by the [place]  FDA and has been authorized for detection and/or diagnosis of SARS-CoV-2 by FDA under an Emergency Use Authorization (EUA). This EUA will remain in effect (meaning this test can be used) for the duration of the COVID-19 declaration under Section 564(b)(1) of the Act, 21 U.S.C. section 360bbb-3(b)(1), unless the authorization is terminated or revoked.  Performed at [facility], [address] [place], [place] [address]       Radiology Studies: No results found.      LOS: 7 days   Harlene RAYMOND Bowl  [facility] Pager on [email or web address]  01/12/2024, 11:01 AM

## 2024-01-12 NOTE — Progress Notes (Signed)
 Progress Note  Patient Name: Victoria Delgado Date of Encounter: 01/12/2024  Primary Cardiologist: Lynwood Schilling, MD  Subjective   No acute events overnight.  Shortness of breath resolved.  Inpatient Medications    Scheduled Meds:  amiodarone   100 mg Oral Daily   apixaban   5 mg Oral BID   arformoterol   15 mcg Nebulization BID   atropine   1 drop Right Eye Daily   budesonide  (PULMICORT ) nebulizer solution  0.5 mg Nebulization BID   ezetimibe   10 mg Oral Daily   furosemide   40 mg Intravenous TID   metoprolol  tartrate  12.5 mg Oral BID   omeprazole   40 mg Oral BID AC   predniSONE   50 mg Oral Q breakfast   rosuvastatin   20 mg Oral Daily   sodium chloride  flush  3 mL Intravenous Q12H   Continuous Infusions:  PRN Meds: acetaminophen , ipratropium, levalbuterol , polyvinyl alcohol , simethicone    Vital Signs    Vitals:   01/11/24 2350 01/12/24 0310 01/12/24 0400 01/12/24 0740  BP: (!) 106/47  (!) 107/50 (!) 105/57  Pulse: 65 (!) 59 62 65  Resp: 18  19 18   Temp: 97.8 F (36.6 C)  97.7 F (36.5 C) 97.8 F (36.6 C)  TempSrc: Oral  Oral Oral  SpO2: 98% 98% 97%   Weight:      Height:        Intake/Output Summary (Last 24 hours) at 01/12/2024 1034 Last data filed at 01/12/2024 0901 Gross per 24 hour  Intake 200 ml  Output 553 ml  Net -353 ml   Filed Weights   01/09/24 0500 01/10/24 0330 01/11/24 0538  Weight: 123.8 kg 122.6 kg 122.5 kg    Telemetry     Personally reviewed.  NSR.  ECG    Not performed today  Physical Exam   GEN: No acute distress.   Neck: Not examined due to body habitus Cardiac: RRR, no murmur, rub, or gallop.  Respiratory: Nonlabored. Clear to auscultation bilaterally. GI: Soft, nontender, bowel sounds present. MS: No edema; No deformity. Neuro:  Nonfocal. Psych: Alert and oriented x 3. Normal affect.  Labs    Chemistry Recent Labs  Lab 01/10/24 [phone] 01/11/24 0255 01/12/24 0333  NA 141 142 140  K 3.2* 3.4* 3.4*  CL 98 96* 98   CO2 32 31 29  GLUCOSE 159* 209* 194*  BUN 26* 29* 28*  CREATININE 0.95 1.04* 0.90  CALCIUM  8.7* 9.1 8.9  PROT 5.7* 5.8* 5.4*  ALBUMIN 2.9* 3.0* 2.9*  AST 18 20 18   ALT 16 16 17   ALKPHOS 59 63 57  BILITOT 0.8 0.8 0.7  GFRNONAA >60 54* >60  ANIONGAP 11 15 13      Hematology Recent Labs  Lab 01/10/24 0639 01/11/24 0255 01/12/24 0333  WBC 10.8* 9.7 10.6*  RBC 3.04* 2.98* 2.86*  HGB 8.7* 8.6* 8.2*  HCT 27.5* 27.3* 26.3*  MCV 90.5 91.6 92.0  MCH 28.6 28.9 28.7  MCHC 31.6 31.5 31.2  RDW 20.1* 19.9* 19.6*  PLT 373 356 323    Cardiac Enzymes Recent Labs  Lab 12/28/23 1536 12/28/23 1752 01/05/24 0540 01/05/24 0851  TROPONINIHS 21* 18* 31* 30*    BNP Recent Labs  Lab 01/07/24 0739  BNP 334.8*     DDimerNo results for input(s): DDIMER in the last 168 hours.   Radiology    No results found.   Assessment & Plan   Acute on chronic diastolic heart failure, near compensated Severe mitral stenosis secondary to severe  MAC (Mean PG 19 mmHg) Hypertrophic cardiomyopathy ?  Melena yesterday Paroxysmal A-fib OSA HLD   -SOB significantly improved, resolved.  Currently on IV Lasix  40 mg TID, will switch to BID and transition to p.o. Lasix  40 mg twice daily starting tomorrow.   -Follow-up with GI for questionable melena.  Continue to hold Eliquis . -Severe mitral stenosis with mean PG 19 mmHg in the setting of hypertrophic cardiomyopathy.  Continue metoprolol  tartrate 12.5 mg twice daily. Will need to repeat limited echocardiogram once HR is around 60 bpm.   -Continue amiodarone  100 mg once daily.  Continue rosuvastatin  20 mg nightly and Zetia  10 mg once daily for HLD management.  Signed, Diannah SHAUNNA Maywood, MD  01/12/2024, 10:34 AM

## 2024-01-12 NOTE — Consult Note (Signed)
 Consultation Note   Referring Provider:  [facility] PCP: Valentin Skates, DO Primary Gastroenterologist: Norleen Kiang, MD         Reason for Consultation: dark stools , FOBT +  DOA: 01/05/2024         Hospital Day: 8   ASSESSMENT    Brief Narrative:  83 y.o. year old female with a history of DM2, CAD / prior stent placement , Afib on Eliquis , obesity, OSA on CPAP, severe COPD,  HTN, severe aortic stenosis s/p TAVR, severe mitral stenosis, diastolic heart failure, cholelithiasis s/p cholecystectomy, GERD. Patient admitted 2/2 with acute on chronic hypoxic respiratory failure  / diastolic CHF exacerbation, history of IDA on chronic iron .   Dark FOBT+ stool / acute on chronic anemia with history of iron deficiency.  This has been a reoccurring presentation over the years on Eliquis . Could possibly have intestinal AVMs. Intestinal neoplasm not excluded but seems less likely given chronicity of problem. On chronic oral iron. Currently not clearly iron deficient by labs.   Hgb 10.2 >>9.5 >> 8.7 >> 8.2.  Getting Omeprazole  40 mg daily   AFIB. On Amiodarone . Eliquis  now on hold  Acute on chronic respiratory failure . Acute on chronic diastolic CHF exacerbation Severe mitral stenosis Has been undergoing diuresis. Cardiology following  Possible pneumonia / bronchitis Treated with antibiotics / Nebulizers / steroids  Prolonged QT interval  Morbid obesity   Principal Problem:   Acute on chronic respiratory failure with hypoxemia (HCC) Active Problems:   HTN (hypertension)   Sleep apnea   GERD (gastroesophageal reflux disease)   Mixed hyperlipidemia   Obesity, Class III, BMI 40-49.9 (morbid obesity) (HCC)   PAF (paroxysmal atrial fibrillation) (HCC)   Diastolic congestive heart failure (HCC)   Pneumonia   Elevated troponin   Prolonged QT interval   Controlled type 2 diabetes mellitus without complication, without long-term current  use of insulin  (HCC)      PLAN:   --For now, increase Omeprazole  to BID --She is at increased risks for procedures and doesn't want invasive workup. Unfortunately it seems she isn't able to come off Eliquis .   HPI   Ms Victoria Delgado was admitted 2/2 with acute on chronic hypoxic respiratory failure  / diastolic CHF exacerbation. She has Afib and has been on / off Eliquis  at various times over the years. She says she always ends up having GI bleeding on Eliquis . She has undergone ablations but has been back on Eliquis  since the fall. She has been having dark stool since then but also takes iron. Last seen by us  in Dec 2022 for IDA. . EGD / colonoscopy recommended. She wasn't actively bleeding and wanted to forego any endoscopic evaluations. She hasn't had a colonoscopy in many years.   We were called to evaluate dark stools. She had a dark tarry BM last night which was FOBT+. She says that prior BMs this admission have also been dark but she was on iron up until this admission. She has no abdominal pain. No N/V or other GI complaints. She doesn't want any invasive Gi workup. She is just starting to feel better from heart / respiratory failure.   Previous GI Evaluations  None found. Apparently had a negative colonoscopy in  2003 and negative Cologuard in 2017   Recent Labs    01/10/24 0639 01/11/24 0255 01/12/24 0333  WBC 10.8* 9.7 10.6*  HGB 8.7* 8.6* 8.2*  HCT 27.5* 27.3* 26.3*  PLT 373 356 323   Recent Labs    01/10/24 0639 01/11/24 0255 01/12/24 0333  NA 141 142 140  K 3.2* 3.4* 3.4*  CL 98 96* 98  CO2 32 31 29  GLUCOSE 159* 209* 194*  BUN 26* 29* 28*  CREATININE 0.95 1.04* 0.90  CALCIUM  8.7* 9.1 8.9   Recent Labs    01/12/24 0333  PROT 5.4*  ALBUMIN 2.9*  AST 18  ALT 17  ALKPHOS 57  BILITOT 0.7   01/08/24 Ferritin 58 TIBC 455, 14% iron sat  Past Medical History:  Diagnosis Date   Abdominal pain    Arthritis    osteoarthritis-hips,knees, back, hands   Asymmetric  septal hypertrophy    Atrial fibrillation (HCC)    CAD (coronary artery disease)    2 drug-eluting stents placed in the circumflex leading into a marginal.  May 2017.  Justine Cleaver.   catheterization on 03/19/2018, this showed patent stent in the proximal to mid left circumflex artery, 20% ostial left circumflex artery disease, 40% OM 3 disease, widely patent stent in the ostial D1, 20% ostial to proximal LAD disease, 20% mid LAD disease.   Cataract    corrective surgery done   Fatty liver    Gallstones    GERD (gastroesophageal reflux disease)    Hepatitis    hepatitis A; past hx.,many yrs ago ? food source   Hernia, hiatal    Hyperlipidemia    Hypertension    Obesity    Sleep apnea    CPAP    Past Surgical History:  Procedure Laterality Date   APPENDECTOMY  2004   CARDIOVERSION N/A 01/03/2022   Procedure: CARDIOVERSION;  Surgeon: Barbaraann Darryle Ned, MD;  Location: [facility] ENDOSCOPY;  Service: Cardiovascular;  Laterality: N/A;   CARDIOVERSION N/A 09/10/2023   Procedure: CARDIOVERSION;  Surgeon: Lonni Slain, MD;  Location: [facility] INVASIVE CV LAB;  Service: Cardiovascular;  Laterality: N/A;   CATARACT EXTRACTION W/ INTRAOCULAR LENS IMPLANT     both eyes   CHOLECYSTECTOMY     LEFT HEART CATH AND CORONARY ANGIOGRAPHY N/A 03/19/2018   Procedure: LEFT HEART CATH AND CORONARY ANGIOGRAPHY;  Surgeon: Verlin Lonni BIRCH, MD;  Location: [facility] INVASIVE CV LAB;  Service: Cardiovascular;  Laterality: N/A;   NM MYOCAR PERF WALL MOTION  08/15/04   No ischemia   RETINAL DETACHMENT SURGERY Bilateral    remains with slight hazy vision with lights   RIGHT/LEFT HEART CATH AND CORONARY ANGIOGRAPHY N/A 07/11/2020   Procedure: RIGHT/LEFT HEART CATH AND CORONARY ANGIOGRAPHY;  Surgeon: Wonda Sharper, MD;  Location: [facility] INVASIVE CV LAB;  Service: Cardiovascular;  Laterality: N/A;   TOTAL HIP ARTHROPLASTY Right 08/04/2014   Procedure: RIGHT TOTAL HIP ARTHROPLASTY ANTERIOR APPROACH;  Surgeon: Dempsey Melodi GAILS, MD;  Location: [facility] ORS;  Service: Orthopedics;  Laterality: Right;   TOTAL HIP ARTHROPLASTY Left 01/05/2015   Procedure: LEFT TOTAL HIP ARTHROPLASTY ANTERIOR APPROACH;  Surgeon: Dempsey Melodi GAILS, MD;  Location: [facility] ORS;  Service: Orthopedics;  Laterality: Left;   US  ECHOCARDIOGRAPHY  06/25/2012   Moderate ASH,LV hyperdynamic,LA is mod. dilated,trace MR,TR,AI    Family History  Problem Relation Age of Onset   Hypertension Mother    CVA Mother    Emphysema Mother        smoked  Lung cancer Father 36       asbestos exposure   Cancer - Lung Father        smoked and asbestos exp   Emphysema Sister        smoked   Esophageal cancer Other        nephew   Diabetes Other        nephew   Colon cancer Neg Hx    Pancreatic cancer Neg Hx    Kidney disease Neg Hx    Liver disease Neg Hx     Prior to Admission medications   Medication Sig Start Date End Date Taking? Authorizing Provider  ACETAMINOPHEN  PO Take 650 mg by mouth daily as needed for moderate pain or headache.   Yes [provider]  amiodarone  (PACERONE ) 100 MG tablet Take 1 tablet (100 mg total) by mouth daily. 12/30/23  Yes Gonfa, Taye T, MD  apixaban  (ELIQUIS ) 5 MG TABS tablet Take 1 tablet (5 mg total) by mouth 2 (two) times daily. 08/14/23  Yes Meng, Hao, PA  atropine  1 % ophthalmic solution Place 1 drop into the right eye daily.   Yes [provider]  cholecalciferol  (VITAMIN D3) 25 MCG (1000 UNIT) tablet Take 1,000 Units by mouth 2 (two) times daily.   Yes [provider]  Coenzyme Q10 (COQ10) 100 MG CAPS Take 100 mg by mouth daily.   Yes [provider]  CRESTOR  20 MG tablet TAKE 1 TABLET(20 MG) BY MOUTH DAILY 01/30/23  Yes Lavona Agent, MD  ezetimibe  (ZETIA ) 10 MG tablet TAKE 1 TABLET(10 MG) BY MOUTH DAILY 08/20/23  Yes Meng, Hao, PA  fluticasone -salmeterol (WIXELA INHUB) 100-50 MCG/ACT AEPB Inhale 1 puff into the lungs 2 (two) times daily. 12/24/23  Yes Kassie Acquanetta Bradley, MD  furosemide   (LASIX ) 40 MG tablet Take 1 tablet (40 mg total) by mouth daily. 06/17/23  Yes Lavona Agent, MD  levalbuterol  (XOPENEX  HFA) 45 MCG/ACT inhaler Inhale 1-2 puffs into the lungs every 6 (six) hours as needed for wheezing. 06/26/22  Yes Gladis Leonor HERO, MD  nitroGLYCERIN  (NITROSTAT ) 0.4 MG SL tablet Place 1 tablet (0.4 mg total) under the tongue every 5 (five) minutes as needed for chest pain. 05/07/16  Yes Army Katheryn BROCKS, NP  omeprazole  (PRILOSEC) 20 MG capsule Take 20 mg by mouth 2 (two) times daily before a meal.   Yes [provider]  Propylene Glycol (SYSTANE BALANCE) 0.6 % SOLN Place 1 drop into both eyes 2 (two) times daily as needed (dry eyes).   Yes [provider]  Tiotropium Bromide Monohydrate  (SPIRIVA  RESPIMAT) 1.25 MCG/ACT AERS Inhale 2 puffs into the lungs daily. 05/30/23  Yes Gladis Leonor HERO, MD  metoprolol  succinate (TOPROL -XL) 25 MG 24 hr tablet Take 25 mg by mouth daily. Patient not taking: Reported on 01/05/2024    [provider]    Current Facility-Administered Medications  Medication Dose Route Frequency Provider Last Rate Last Admin   acetaminophen  (TYLENOL ) tablet 650 mg  650 mg Oral Q6H PRN Sundil, Subrina, MD   650 mg at 01/06/24 2249   amiodarone  (PACERONE ) tablet 100 mg  100 mg Oral Daily Krishnan, Gokul, MD   100 mg at 01/11/24 0844   apixaban  (ELIQUIS ) tablet 5 mg  5 mg Oral BID Smith, Rondell A, MD   5 mg at 01/11/24 2121   arformoterol  (BROVANA ) nebulizer solution 15 mcg  15 mcg Nebulization BID Smith, Rondell A, MD   15 mcg at 01/12/24 9096   atropine   1 % ophthalmic solution 1 drop  1 drop Right Eye Daily Claudene Reeves A, MD   1 drop at 01/11/24 1222   budesonide  (PULMICORT ) nebulizer solution 0.5 mg  0.5 mg Nebulization BID Claudene Reeves A, MD   0.5 mg at 01/12/24 9096   ezetimibe  (ZETIA ) tablet 10 mg  10 mg Oral Daily Smith, Rondell A, MD   10 mg at 01/11/24 0844   furosemide  (LASIX ) injection 40 mg  40 mg Intravenous TID Mallipeddi,  Vishnu P, MD       ipratropium (ATROVENT ) nebulizer solution 0.5 mg  0.5 mg Nebulization Q6H PRN Claudene Reeves LABOR, MD       levalbuterol  (XOPENEX ) nebulizer solution 1.25 mg  1.25 mg Nebulization Q6H PRN Claudene Reeves LABOR, MD       metoprolol  tartrate (LOPRESSOR ) tablet 12.5 mg  12.5 mg Oral BID Vann, Jessica U, DO   12.5 mg at 01/11/24 2121   omeprazole  (PRILOSEC) capsule 40 mg  40 mg Oral Daily Pudota, Kingsley P, MD   40 mg at 01/11/24 0845   polyvinyl alcohol  (LIQUIFILM TEARS) 1.4 % ophthalmic solution 1 drop  1 drop Both Eyes BID PRN Claudene Reeves A, MD       predniSONE  (DELTASONE ) tablet 50 mg  50 mg Oral Q breakfast Claudene Reeves A, MD   50 mg at 01/12/24 0854   rosuvastatin  (CRESTOR ) tablet 20 mg  20 mg Oral Daily Krishnan, Gokul, MD   20 mg at 01/11/24 0844   simethicone  (MYLICON) chewable tablet 80 mg  80 mg Oral Q6H PRN Charlton Evalene RAMAN, MD   80 mg at 01/12/24 9370   sodium chloride  flush (NS) 0.9 % injection 3 mL  3 mL Intravenous Q12H Claudene Reeves A, MD   3 mL at 01/12/24 0857    Allergies as of 01/05/2024 - Review Complete 01/05/2024  Allergen Reaction Noted   Codeine Nausea Only 04/18/2011   Kenalog [triamcinolone acetonide] Other (See Comments) 10/16/2012   Kenalog [triamcinolone] Rash 11/21/2021   Pantoprazole  Rash 11/21/2021   Relafen  [nabumetone ] Other (See Comments) 09/07/2013   Albuterol  Other (See Comments) 01/15/2023   Epinephrine Palpitations 06/30/2020   Penicillins Rash 04/18/2011   Tramadol  Other (See Comments) 01/05/2014    Social History   Socioeconomic History   Marital status: Married    Spouse name: Not on file   Number of children: 2   Years of education: Not on file   Highest education level: Not on file  Occupational History    Employer: OTHER  Tobacco Use   Smoking status: Former    Current packs/day: 0.00    Average packs/day: 1 pack/day for 20.0 years (20.0 ttl pk-yrs)    Types: Cigarettes    Start date: 12/03/1968    Quit date: 12/03/1988     Years since quitting: 35.1   Smokeless tobacco: Never  Vaping Use   Vaping status: Never Used  Substance and Sexual Activity   Alcohol  use: No    Alcohol /week: 0.0 standard drinks of alcohol    Drug use: No   Sexual activity: Yes  Other Topics Concern   Not on file  Social History Narrative   Two grandchildren.            Social Drivers of Corporate Investment Banker Strain: Not on file  Food Insecurity: No Food Insecurity (01/05/2024)   Hunger Vital Sign    Worried About Running Out of Food in the Last Year: Never true    Ran Out of  Food in the Last Year: Never true  Transportation Needs: No Transportation Needs (01/05/2024)   PRAPARE - Administrator, Civil Service (Medical): No    Lack of Transportation (Non-Medical): No  Physical Activity: Not on file  Stress: Not on file  Social Connections: Unknown (01/07/2024)   Social Connection and Isolation Panel [NHANES]    Frequency of Communication with Friends and Family: More than three times a week    Frequency of Social Gatherings with Friends and Family: Three times a week    Attends Religious Services: Patient declined    Active Member of Clubs or Organizations: No    Attends Banker Meetings: Never    Marital Status: Married  Catering Manager Violence: Not At Risk (01/05/2024)   Humiliation, Afraid, Rape, and Kick questionnaire    Fear of Current or Ex-Partner: No    Emotionally Abused: No    Physically Abused: No    Sexually Abused: No     Code Status   Code Status: Full Code  Review of Systems: All systems reviewed and negative except where noted in HPI.  Physical Exam: Vital signs in last 24 hours: Temp:  [97.7 F (36.5 C)-98.3 F (36.8 C)] 97.8 F (36.6 C) (02/09 0740) Pulse Rate:  [59-87] 65 (02/09 0740) Resp:  [18-20] 18 (02/09 0740) BP: (105-140)/(47-57) 105/57 (02/09 0740) SpO2:  [92 %-100 %] 97 % (02/09 0400) Last BM Date : 01/11/24  General:  Pleasant female in  NAD Psych:  Cooperative. Normal mood and affect Eyes: Pupils equal Ears:  Normal auditory acuity Nose: No deformity, discharge or lesions Neck:  Supple, no masses felt Lungs:  Clear to auscultation.  Heart:  Regular rate.  Abdomen:  Soft, nondistended, nontender, active bowel sounds, no masses felt Rectal :  Deferred Msk: Symmetrical without gross deformities.  Neurologic:  Alert, oriented, grossly normal neurologically Extremities : 1+ BLE edema Skin:  Intact without significant lesions.    Intake/Output from previous day: 02/08 0701 - 02/09 0700 In: -  Out: 853 [Urine:851; Stool:2] Intake/Output this shift:  Total I/O In: 200 [P.O.:200] Out: -    Vina Dasen, NP-C   01/12/2024, 9:40 AM

## 2024-01-13 DIAGNOSIS — I480 Paroxysmal atrial fibrillation: Secondary | ICD-10-CM | POA: Diagnosis not present

## 2024-01-13 DIAGNOSIS — Z5320 Procedure and treatment not carried out because of patient's decision for unspecified reasons: Secondary | ICD-10-CM

## 2024-01-13 DIAGNOSIS — J9621 Acute and chronic respiratory failure with hypoxia: Secondary | ICD-10-CM | POA: Diagnosis not present

## 2024-01-13 DIAGNOSIS — I5033 Acute on chronic diastolic (congestive) heart failure: Secondary | ICD-10-CM | POA: Diagnosis not present

## 2024-01-13 DIAGNOSIS — I342 Nonrheumatic mitral (valve) stenosis: Secondary | ICD-10-CM

## 2024-01-13 DIAGNOSIS — I2510 Atherosclerotic heart disease of native coronary artery without angina pectoris: Secondary | ICD-10-CM

## 2024-01-13 DIAGNOSIS — Z952 Presence of prosthetic heart valve: Secondary | ICD-10-CM

## 2024-01-13 LAB — BASIC METABOLIC PANEL
Anion gap: 11 (ref 5–15)
BUN: 29 mg/dL — ABNORMAL HIGH (ref 8–23)
CO2: 28 mmol/L (ref 22–32)
Calcium: 8.8 mg/dL — ABNORMAL LOW (ref 8.9–10.3)
Chloride: 101 mmol/L (ref 98–111)
Creatinine, Ser: 1.01 mg/dL — ABNORMAL HIGH (ref 0.44–1.00)
GFR, Estimated: 56 mL/min — ABNORMAL LOW (ref >60)
Glucose, Bld: 135 mg/dL — ABNORMAL HIGH (ref 70–99)
Potassium: 3.9 mmol/L (ref 3.5–5.1)
Sodium: 140 mmol/L (ref 135–145)

## 2024-01-13 LAB — CBC
HCT: 29.3 % — ABNORMAL LOW (ref 36.0–46.0)
Hemoglobin: 9 g/dL — ABNORMAL LOW (ref 12.0–15.0)
MCH: 28.5 pg (ref 26.0–34.0)
MCHC: 30.7 g/dL (ref 30.0–36.0)
MCV: 92.7 fL (ref 80.0–100.0)
Platelets: 338 10*3/uL (ref 150–400)
RBC: 3.16 MIL/uL — ABNORMAL LOW (ref 3.87–5.11)
RDW: 19.9 % — ABNORMAL HIGH (ref 11.5–15.5)
WBC: 9.5 10*3/uL (ref 4.0–10.5)
nRBC: 0.7 % — ABNORMAL HIGH (ref 0.0–0.2)

## 2024-01-13 MED ORDER — ASPIRIN 81 MG PO TBEC
81.0000 mg | DELAYED_RELEASE_TABLET | Freq: Every day | ORAL | Status: DC
  Administered 2024-01-14: 81 mg via ORAL
  Filled 2024-01-13: qty 1

## 2024-01-13 MED ORDER — TORSEMIDE 20 MG PO TABS
20.0000 mg | ORAL_TABLET | Freq: Every day | ORAL | Status: DC
  Administered 2024-01-14: 20 mg via ORAL
  Filled 2024-01-13: qty 1

## 2024-01-13 MED ORDER — METOPROLOL SUCCINATE ER 25 MG PO TB24: 25.0000 mg | ORAL_TABLET | Freq: Every day | ORAL | Status: DC

## 2024-01-13 NOTE — Progress Notes (Signed)
 Physical Therapy Treatment Patient Details Name: Victoria Delgado MRN: [id number] DOB: February 11, 1941 Today's Date: 01/13/2024   History of Present Illness 83 y.o. female admitted 01/05/24 with SOB, acute respiratory failure and CHF exacerbation. PMHx: admission 1/25-1/26 for DOE, HTN, CAD, PAF chronic anticoagulation, CM, severe COPD, OSA/OHS on CPAP, morbid obesity, GERD, NSTEMI, AS s/p TAVR.    PT Comments  Pt pleasant, reporting increased strength and activity tolerance. Pt able to perform stairs and increased gait distance without supplemental O2 and SPO2 >92% on RA. Pt educated for repeated STS, progressive activity and transfers. Encouraged up to bathroom rather than BSC. Will continue to follow.   HR 90 SpO2 92-98% on RA    If plan is discharge home, recommend the following: Assistance with cooking/housework;Direct supervision/assist for medications management;Assist for transportation;Help with stairs or ramp for entrance;A little help with walking and/or transfers;A little help with bathing/dressing/bathroom   Can travel by private Psychologist, clinical (4 wheels)    Recommendations for Other Services       Precautions / Restrictions Precautions Precautions: Fall Precaution Comments: monitor O2 Restrictions Weight Bearing Restrictions Per Provider Order: No (Simultaneous filing. User may not have seen previous data.)     Mobility  Bed Mobility               General bed mobility comments: in recliner on arrival and exit    Transfers Overall transfer level: Modified independent                 General transfer comment: pt standing with use of armrests to rise from recliner, able to perform 5 sTS with cues for fully upright posture without UE support in standing    Ambulation/Gait Ambulation/Gait assistance: Supervision Gait Distance (Feet): 140 Feet Assistive device: Rolling walker (2 wheels) Gait Pattern/deviations:  Step-through pattern, Decreased stride length   Gait velocity interpretation: 1.31 - 2.62 ft/sec, indicative of limited community ambulator   General Gait Details: cues for posture, one standing rest with SPO2 >92% on RA throughout gait and max HR 90   Stairs Stairs: Yes Stairs assistance: Supervision Stair Management: Forwards, Backwards, Two rails, Alternating pattern Number of Stairs: 3 General stair comments: pt ascended stairs with bil rails forward then backed down   Wheelchair Mobility     Tilt Bed    Modified Rankin (Stroke Patients Only)       Balance Overall balance assessment: Mild deficits observed, not formally tested Sitting-balance support: No upper extremity supported, Feet supported Sitting balance-Leahy Scale: Good     Standing balance support: During functional activity, Bilateral upper extremity supported, No upper extremity supported Standing balance-Leahy Scale: Fair Standing balance comment: static standing without support, RW for gait                            Cognition Arousal: Alert Behavior During Therapy: WFL for tasks assessed/performed Overall Cognitive Status: Within Functional Limits for tasks assessed                                          Exercises      General Comments        Pertinent Vitals/Pain Pain Assessment Pain Assessment: No/denies pain    Home Living  Prior Function            PT Goals (current goals can now be found in the care plan section) Progress towards PT goals: Progressing toward goals    Frequency    Min 1X/week      PT Plan      Co-evaluation              AM-PAC PT "6 Clicks" Mobility   Outcome Measure  Help needed turning from your back to your side while in a flat bed without using bedrails?: None Help needed moving from lying on your back to sitting on the side of a flat bed without using bedrails?: A  Little Help needed moving to and from a bed to a chair (including a wheelchair)?: None Help needed standing up from a chair using your arms (e.g., wheelchair or bedside chair)?: None Help needed to walk in hospital room?: A Little Help needed climbing 3-5 steps with a railing? : A Little 6 Click Score: 21    End of Session   Activity Tolerance: Patient tolerated treatment well Patient left: in chair;with call bell/phone within reach;with family/visitor present Nurse Communication: Mobility status PT Visit Diagnosis: Other abnormalities of gait and mobility (R26.89);Difficulty in walking, not elsewhere classified (R26.2);Muscle weakness (generalized) (M62.81)     Time: 5784-6962 PT Time Calculation (min) (ACUTE ONLY): 20 min  Charges:    $Gait Training: 8-22 mins PT General Charges $$ ACUTE PT VISIT: 1 Visit                     Annis Baseman, PT Acute Rehabilitation Services Office: [phone]    [name] 01/13/2024, 12:24 PM

## 2024-01-13 NOTE — Progress Notes (Signed)
 Occupational Therapy Treatment Patient Details Name: Victoria Delgado MRN: [id number] DOB: 22-May-1941 Today's Date: 01/13/2024   History of present illness 83 y.o. female admitted 01/05/24 with SOB, acute respiratory failure and CHF exacerbation.th complaints of worsening shortness of breath on 01/05/24. PMHx: admission 1/25-1/26 for DOE, HTN, CAD, PAF chronic anticoagulation, CM, severe COPD, OSA/OHS on CPAP, morbid obesity, GERD, NSTEMI, AS s/p TAVR.   OT comments  Pt making excellent progress, received off of O2 and reporting feeling much better. Pt politely declined ADL retraining but eager to mobilize in hallway, opting to use RW for energy conservation with no more than CGA needed. SpO2 > 95% on RA during activity with OT emphasizing self monitoring of symptoms, use of pulse ox to assess SpO2 at home and gradual endurance retraining. Pt endorses spouse able to provide light ADL assist as needed at DC.      If plan is discharge home, recommend the following:  Assistance with cooking/housework;Assist for transportation;Help with stairs or ramp for entrance;A little help with bathing/dressing/bathroom   Equipment Recommendations  Other (comment) (Rollator)    Recommendations for Other Services      Precautions / Restrictions Precautions Precautions: Fall Precaution Comments: monitor O2 Restrictions Weight Bearing Restrictions Per Provider Order: No (Simultaneous filing. User may not have seen previous data.)       Mobility Bed Mobility               General bed mobility comments: in recliner on entry    Transfers Overall transfer level: Needs assistance Equipment used: Rolling walker (2 wheels) Transfers: Sit to/from Stand Sit to Stand: Supervision                 Balance Overall balance assessment: Needs assistance Sitting-balance support: No upper extremity supported, Feet supported Sitting balance-Leahy Scale: Good     Standing balance support: During  functional activity, Bilateral upper extremity supported, No upper extremity supported Standing balance-Leahy Scale: Fair Standing balance comment: BUE support on RW more for energy conservation than balance                           ADL either performed or assessed with clinical judgement   ADL Overall ADL's : Needs assistance/impaired                     Lower Body Dressing: Sitting/lateral leans;Sit to/from stand;Set up Lower Body Dressing Details (indicate cue type and reason): able to doff socks to don slippers (reports improved mobility with slippers). Readdressed that spouse can assist with donning socks/compression stockings if needed and pt with prior adaptive strategies to manage stockings well.             Functional mobility during ADLs: Contact guard assist;Rolling walker (2 wheels) General ADL Comments: Reinforced energy conservation, self monitoring and when rest breakas needed. Encouraged O2 monitoring at home, use of Rollator for rest breaks/carrying items and gradual improvements in activity tolerance.    Extremity/Trunk Assessment Upper Extremity Assessment Upper Extremity Assessment: Generalized weakness;Right hand dominant   Lower Extremity Assessment Lower Extremity Assessment: Defer to PT evaluation        Vision   Vision Assessment?: No apparent visual deficits   Perception     Praxis      Cognition Arousal: Alert Behavior During Therapy: WFL for tasks assessed/performed Overall Cognitive Status: Within Functional Limits for tasks assessed  Exercises      Shoulder Instructions       General Comments      Pertinent Vitals/ Pain       Pain Assessment Pain Assessment: No/denies pain  Home Living                                          Prior Functioning/Environment              Frequency  Min 1X/week        Progress Toward  Goals  OT Goals(current goals can now be found in the care plan section)  Progress towards OT goals: Progressing toward goals  Acute Rehab OT Goals Patient Stated Goal: not have to wear O2 at home. walk again in afternoon, not overdo it OT Goal Formulation: With patient Time For Goal Achievement: 01/21/24 Potential to Achieve Goals: Good ADL Goals Pt Will Perform Grooming: with supervision;standing Pt Will Perform Upper Body Dressing: with supervision;sitting Pt Will Perform Lower Body Dressing: with supervision;sitting/lateral leans;sit to/from stand Pt Will Transfer to Toilet: with supervision;ambulating;regular height toilet Additional ADL Goal #1: Pt will tolerate OOB activity x10 min with SpO2 above 90% in prep for ADLs Additional ADL Goal #2: pt will verbalize x3 energy conservation strategies in prep for ADLs  Plan      Co-evaluation                 AM-PAC OT "6 Clicks" Daily Activity     Outcome Measure   Help from another person eating meals?: None Help from another person taking care of personal grooming?: None Help from another person toileting, which includes using toliet, bedpan, or urinal?: A Little Help from another person bathing (including washing, rinsing, drying)?: A Little Help from another person to put on and taking off regular upper body clothing?: A Little Help from another person to put on and taking off regular lower body clothing?: A Little 6 Click Score: 20    End of Session Equipment Utilized During Treatment: Rolling walker (2 wheels);Gait belt  OT Visit Diagnosis: Other abnormalities of gait and mobility (R26.89);Muscle weakness (generalized) (M62.81)   Activity Tolerance Patient tolerated treatment well   Patient Left in chair;with call bell/phone within reach   Nurse Communication Mobility status;Other (comment) (O2)        Time: 4098-1191 OT Time Calculation (min): 17 min  Charges: OT General Charges $OT Visit: 1 Visit OT  Treatments $Therapeutic Activity: 8-22 mins  Lawrence Pretty, OTR/L Acute Rehab Services Office: [phone]   Annabella Barr 01/13/2024, 9:52 AM

## 2024-01-13 NOTE — Progress Notes (Signed)
 SATURATION QUALIFICATIONS: (This note is used to comply with regulatory documentation for home oxygen)  Patient Saturations on Room Air at Rest = 96%  Patient Saturations on Room Air while Ambulating = 95%  Please briefly explain why patient needs home oxygen: N/A

## 2024-01-13 NOTE — Progress Notes (Signed)
 [facility] PROGRESS NOTE   GWENNETTE CAMARATA ZOX:[id number] DOB: Apr 14, 1941 DOA: 01/05/2024  PCP: Windell Hasty, DO  Brief History: 83 y.o. female with medical history significant of hypertension, CAD, paroxysmal atrial fibrillation chronic anticoagulation, HOCM, severe COPD, OSA/OHS on CPAP, morbid obesity, and GERD presents with complaints of worsening shortness of breath.  Currently admitted for acute on chronic hypoxic respiratory failure secondary to diastolic CHF exacerbation.      Subjective/Interval History: Off O2    Assessment/Plan:  Acute on chronic respiratory failure with hypoxia and hypercapnia Suspected diastolic congestive heart failure exacerbation Bilateral pleural effusions Patient presented with complaints of worsening shortness of breath and orthopnea.   Just recently discharged from the hospital for dyspnea on exertion on 4 L of nasal cannula oxygen.  Denies any significant change in weight.  Noted to be hypoxic into the 80s even on 4 L with ambulation.   CT angiogram of the chest obtained which noted cardiomegaly with pulmonary edema and layering pleural effusions.  BNP was 241.7.   Echocardiogram was done which showed EF of 70 to 75%.  Diastolic function could not be evaluated.  Right ventricular systolic function was normal.  Elevated pulmonary artery systolic pressure was noted. Patient was placed on diuretics-- per cards   Monitor blood pressures closely. -continue to wean O2 as able-- off O2 at rest; suspect will go back on O2 at home with exertion but does need to be able to complete ADLs -PT/OT   Possible pneumonia Bronchitis CT angiogram of the chest:  noted bilateral opacities with superimposed infection cannot be ruled out.  Patient was started on antibiotics of Rocephin  and doxycycline  x 5 days For now we will continue with antibiotics.  Nebulizer treatments.  Also started on steroids. -Mucinex    Elevated troponin Acute on chronic.   High-sensitivity troponin 31-> 30.  Normal troponin slightly higher than previous but still essentially flat.  Suspect this secondary to demand  Hypokalemia -replete  Normocytic anemia Hemoglobin was 14 back in October.  Her current hemoglobin is definitely lower than her baseline.   -heme + -GI consult   Paroxysmal atrial fibrillation anticoagulation Patient currently in sinus rhythm -hold eliquis  for now  Enlarged mediastinal lymph nodes Incidentally noted on CT angiogram.  Will need reevaluation in a few weeks.  Enlargement of lymph nodes likely reactive.  Prolonged QT interval On admission QT C noted to be 498.     Essential hypertension Patient's blood pressures had been noted to be soft during last hospitalization for which metoprolol  succinate had been recommended to be discontinued.  Blood pressure is stable.  Not on any blood pressure lowering agents apart from the diuretics.   Diabetes mellitus type 2 with hyperglycemia without long-term use of insulin  - hemoglobin A1c was noted to be 7.1 when checked 12/07/2022.   Hyperlipidemia -Continue Crestor  and Zetia    GERD -Continue pharmacy substitution of Pepcid  for omeprazole  as patient has allergy to recommended substitution Protonix    OSA  --? Possible O2 in CPAP at night. Message sent to [facility] and Dr. Washington Hacker -resp therapy for overnight pulse ox   Morbid obesity Estimated body mass index is 41.13 kg/m as calculated from the following:   Height as of this encounter: 5\' 8"  (1.727 m).   Weight as of this encounter: 122.7 kg.      DVT prophylaxis: Eliquis  on hold, SCDs CODE STATUS: full Family communication: home in AM   Status is: Inpatient Remains inpatient appropriate because: Acute CHF, bronchitis  Medications: Scheduled:  amiodarone   100 mg Oral Daily   arformoterol   15 mcg Nebulization BID   atropine   1 drop Right Eye Daily   budesonide  (PULMICORT ) nebulizer solution  0.5 mg Nebulization BID    ezetimibe   10 mg Oral Daily   furosemide   40 mg Intravenous BID   metoprolol  tartrate  12.5 mg Oral BID   omeprazole   40 mg Oral BID AC   potassium chloride   40 mEq Oral TID   predniSONE   50 mg Oral Q breakfast   rosuvastatin   20 mg Oral Daily   sodium chloride  flush  3 mL Intravenous Q12H   Continuous:   PRN:acetaminophen , ipratropium, levalbuterol , polyvinyl alcohol , simethicone      Objective:  Vital Signs  Vitals:   01/13/24 0809 01/13/24 0816 01/13/24 1219 01/13/24 1503  BP:  (!) 120/45  (!) 114/45  Pulse:  65  65  Resp:  13  20  Temp:  (!) 97.5 F (36.4 C)  98 F (36.7 C)  TempSrc:  Oral  Oral  SpO2: 97% 97% 94% 91%  Weight:      Height:        Intake/Output Summary (Last 24 hours) at 01/13/2024 1606 Last data filed at 01/12/2024 2349 Gross per 24 hour  Intake 120 ml  Output 501 ml  Net -381 ml   Filed Weights   01/10/24 0330 01/11/24 0538 01/13/24 0408  Weight: 122.6 kg 122.5 kg 122.7 kg     General: Appearance:    Severely obese female in no acute distress     Lungs:     Clear to auscultation bilaterally, respirations unlabored  Heart:    Normal heart rate.      MS:   All extremities are intact.   Neurologic:   Awake, alert      Lab Results:  Data Reviewed: I have personally reviewed following labs and reports of the imaging studies  CBC: Recent Labs  Lab 01/07/24 0739 01/08/24 0325 01/09/24 0759 01/10/24 0639 01/11/24 0255 01/12/24 0333 01/13/24 0741  WBC 12.3*   < > 10.5 10.8* 9.7 10.6* 9.5  NEUTROABS 9.6*  --   --   --   --   --   --   HGB 8.9*   < > 8.9* 8.7* 8.6* 8.2* 9.0*  HCT 27.6*   < > 28.3* 27.5* 27.3* 26.3* 29.3*  MCV 91.7   < > 91.6 90.5 91.6 92.0 92.7  PLT 417*   < > 360 373 356 323 338   < > = values in this interval not displayed.    Basic Metabolic Panel: Recent Labs  Lab 01/09/24 0759 01/10/24 0639 01/11/24 0255 01/12/24 0333 01/13/24 0741  NA 140 141 142 140 140  K 3.6 3.2* 3.4* 3.4* 3.9  CL 98 98 96* 98  101  CO2 29 32 31 29 28   GLUCOSE 166* 159* 209* 194* 135*  BUN 28* 26* 29* 28* 29*  CREATININE 1.14* 0.95 1.04* 0.90 1.01*  CALCIUM  8.8* 8.7* 9.1 8.9 8.8*  MG  --   --  2.2  --   --     GFR: Estimated Creatinine Clearance: 61.3 mL/min (A) (by C-G formula based on SCr of 1.01 mg/dL (H)).  Liver Function Tests: Recent Labs  Lab 01/08/24 0325 01/09/24 0759 01/10/24 0639 01/11/24 0255 01/12/24 0333  AST 18 21 18 20 18   ALT 15 13 16 16 17   ALKPHOS 63 65 59 63 57  BILITOT 0.6 0.7 0.8 0.8 0.7  PROT 5.7* 5.8* 5.7* 5.8* 5.4*  ALBUMIN 2.8* 3.0* 2.9* 3.0* 2.9*     BNP (last 3 results) Recent Labs    01/15/23 1343  PROBNP 302.0*    HbA1C: No results for input(s): "HGBA1C" in the last 72 hours.    Recent Results (from the past 240 hours)  Resp panel by RT-PCR (RSV, Flu A&B, Covid) Anterior Nasal Swab     Status: None   Collection Time: 01/05/24  5:40 AM   Specimen: Anterior Nasal Swab  Result Value Ref Range Status   SARS Coronavirus 2 by RT PCR NEGATIVE NEGATIVE Final   Influenza A by PCR NEGATIVE NEGATIVE Final   Influenza B by PCR NEGATIVE NEGATIVE Final    Comment: (NOTE) The Xpert Xpress SARS-CoV-2/FLU/RSV plus assay is intended as an aid in the diagnosis of influenza from Nasopharyngeal swab specimens and should not be used as a sole basis for treatment. Nasal washings and aspirates are unacceptable for Xpert Xpress SARS-CoV-2/FLU/RSV testing.  Fact Sheet for Patients: BloggerCourse.com  Fact Sheet for Healthcare Providers: SeriousBroker.it  This test is not yet approved or cleared by the [place]  FDA and has been authorized for detection and/or diagnosis of SARS-CoV-2 by FDA under an Emergency Use Authorization (EUA). This EUA will remain in effect (meaning this test can be used) for the duration of the COVID-19 declaration under Section 564(b)(1) of the Act, 21 U.S.C. section 360bbb-3(b)(1), unless the  authorization is terminated or revoked.     Resp Syncytial Virus by PCR NEGATIVE NEGATIVE Final    Comment: (NOTE) Fact Sheet for Patients: BloggerCourse.com  Fact Sheet for Healthcare Providers: SeriousBroker.it  This test is not yet approved or cleared by the [place]  FDA and has been authorized for detection and/or diagnosis of SARS-CoV-2 by FDA under an Emergency Use Authorization (EUA). This EUA will remain in effect (meaning this test can be used) for the duration of the COVID-19 declaration under Section 564(b)(1) of the Act, 21 U.S.C. section 360bbb-3(b)(1), unless the authorization is terminated or revoked.  Performed at [facility], [address], [place], [place] [address]       Radiology Studies: No results found.      LOS: 8 days   Enrigue Harvard  [facility] Pager on [email or web address]  01/13/2024, 4:06 PM

## 2024-01-13 NOTE — Progress Notes (Signed)
 Rounding Note    Patient Name: Victoria Delgado Date of Encounter: 01/13/2024  [facility] Cardiologist: Eilleen Grates, MD   Subjective   Feeling much better. Much less short of breath at rest and improving with exertion. She is hoping that she will not need to go home with oxygen.  We had extensive conversation today regarding her mitral valve, afib, risk of CVA, and risk of bleeding. See summary below. She does not want to pursue surgery or procedures such as endoscopy if possible.  Inpatient Medications    Scheduled Meds:  amiodarone   100 mg Oral Daily   arformoterol   15 mcg Nebulization BID   [START ON 01/14/2024] aspirin  EC  81 mg Oral Daily   atropine   1 drop Right Eye Daily   budesonide  (PULMICORT ) nebulizer solution  0.5 mg Nebulization BID   ezetimibe   10 mg Oral Daily   [START ON 01/14/2024] metoprolol  succinate  25 mg Oral Daily   metoprolol  tartrate  12.5 mg Oral BID   omeprazole   40 mg Oral BID AC   potassium chloride   40 mEq Oral TID   predniSONE   50 mg Oral Q breakfast   rosuvastatin   20 mg Oral Daily   sodium chloride  flush  3 mL Intravenous Q12H   [START ON 01/14/2024] torsemide   20 mg Oral Daily   Continuous Infusions:  PRN Meds: acetaminophen , ipratropium, levalbuterol , polyvinyl alcohol , simethicone    Vital Signs    Vitals:   01/13/24 0809 01/13/24 0816 01/13/24 1219 01/13/24 1503  BP:  (!) 120/45  (!) 114/45  Pulse:  65  65  Resp:  13  20  Temp:  (!) 97.5 F (36.4 C)  98 F (36.7 C)  TempSrc:  Oral  Oral  SpO2: 97% 97% 94% 91%  Weight:      Height:        Intake/Output Summary (Last 24 hours) at 01/13/2024 1832 Last data filed at 01/12/2024 2349 Gross per 24 hour  Intake 120 ml  Output 501 ml  Net -381 ml      01/13/2024    4:08 AM 01/11/2024    5:38 AM 01/10/2024    3:30 AM  Last 3 Weights  Weight (lbs) 270 lb 8.1 oz 270 lb 1 oz 270 lb 4.5 oz  Weight (kg) 122.7 kg 122.5 kg 122.6 kg      Telemetry    SR - Personally  Reviewed  Physical Exam   GEN: No acute distress.   Neck: No JVD Cardiac: RRR, no murmurs, rubs, or gallops.  Respiratory: Clear to auscultation bilaterally. GI: Soft, nontender, non-distended  MS: bilateral 1+ pitting LE edema Neuro:  Nonfocal  Psych: Normal affect   New pertinent results (labs, ECG, imaging, cardiac studies)    Reviewed as below  Patient Profile     83 y.o. female with PMH AS s/p TAVR, CAD s/p PCI, chronic diastolic heart failure, paroxysmal atrial fibrillation, mitral stenosis, history of GI bleeding  Assessment & Plan   Acute on chronic diastolic heart failure Severe mitral stenosis -EF 70-75% this admission, moderate LVH, severely elevated PA pressure. Cannot assess diastolic function due to severe MAC -mean mitral gradient was 19 at a heart rate of 91 bpm. Concern that severe MAC may be impinging on LVOT as well -HR now in the 60s. Appears euvolemic. We discussed limited echo to assess MV gradient as an outpatient that she is clinically improved and heart rate is lower -continue metoprolol , will change back to succinate dose  for tomorrow -changing lasix  to oral torsemide  to start tomorrow AM. This may work better than her home furosemide  given recurrent edema  S/P [facility]R 2021 at [facility] -mean gradient of 23 mm valve was 19 mmHg. Has been higher on [facility] echoes in the past  Paroxysmal atrial fibrillation Possible GI bleeding -CHA2DS2/VAS Stroke Risk Points=7  -afib predates her severe mitral stenosis (has been called moderate in the past); if her mitral stenosis is severe on recheck echo, guidelines would recommend changing to coumadin. This is risky for her given her history of bleeding. -has been off of all anticoagulation for times in the past due to bleeding. Has discussed [facility] in the past and declined this. -continue amiodarone , in sinus rhythm today. Did not hold sinus rhythm without amiodarone . -GI has seen patient this admission, patient declines  endoscopy -she has high CVA risk as above, but also risk of bleeding.  We had an extensive conversation with shared decision making today.  I discussed with her her elevated stroke risk, and I also discussed with her that aspirin  alone does not prevent formation of clot to cause a stroke and A-fib.  I also discussed with her that if her mitral stenosis has progressed to severe, that Coumadin is preferred over other anticoagulants.  We also discussed her risk of bleeding and her known history of GI and other spontaneous bleeds.  She was able to repeat the risks back to me and understood her very elevated risk of stroke without anticoagulation.  After shared decision making, she does not want to be on anticoagulation.  With her CAD and TAVR, we discussed restarting 81 mg of aspirin  tomorrow as she reports that she has not had any bleeding issues on aspirin  alone.  She does understand that aspirin  does not prevent stroke in atrial fibrillation.    CAD s/p prior PCI -continue rosuvastatin , ezetimibe  -As noted above, will restart 81 mg of aspirin  as she declines anticoagulation  Overall she has multiple high risk comorbidities, would be a poor surgical candidate with these. Will attempt to manage symptoms medically. If she does well on oral torsemide  in the AM, could potentially be ready for discharge tomorroe    Signed, Sheryle Donning, MD  01/13/2024, 6:32 PM

## 2024-01-13 NOTE — TOC Initial Note (Signed)
 Transition of Care (TOC) - Initial/Assessment Note  Sherin Dingwall RN, BSN Transitions of Care Unit 4E- RN Case Manager See Treatment Team for direct phone #   Patient Details  Name: PUALANI BOGGESS MRN: [id number] Date of Birth: 1941/08/16  Transition [facility] Care [facility]) CM/SW Contact:    Rox Cope, RN Phone Number: 01/13/2024, 2:36 PM  Clinical Narrative:                 Cm spoke with pt at bedside to discuss potential transition needs.  Per pt she is home w/ spouse, has home 42 w/ [employer] that was set up on last admit, she also has CPAP at home- that she has had about 19yrs- that she got through [facility]. She states that she gets her CPAP supplies through [employer]- which her [facility] contracts with for DME needs.   Discussed potential HH needs - which pt voiced she is not sure she will need HH therapy as she states she is moving around much better at this time- CM will f/u prior to discharge.   Also discussed 02 use w/ CPAP- pt voiced that she did not use 02 w/ CPAP last night, and may just f/u with her doctor post discharge. Overnight Pulse oximetry testing done the other night however per results in chart testing was done on 3L, will see if attending wants to retest prior to d/c or have pt f/u outpt.   CM to continue to follow     Expected Discharge Plan: Home w Home Health Services Barriers to Discharge: Continued Medical Work up   Patient Goals and CMS Choice Patient states their goals for this hospitalization and ongoing recovery are:: return home and continue to get stronger CMS Medicare.gov Compare Post Acute Care list provided to:: Patient Choice offered to / list presented to : Patient      Expected Discharge Plan and Services   Discharge Planning Services: CM Consult   Living arrangements for the past 2 months: Single Family Home                                      Prior Living Arrangements/Services Living arrangements for the past 2 months:  Single Family Home Lives with:: Self, Spouse (home 02- [employer], Cpap) Patient language and need for interpreter reviewed:: Yes Do you feel safe going back to the place where you live?: Yes      Need for Family Participation in Patient Care: Yes (Comment) Care giver support system in place?: Yes (comment) Current home services: DME Criminal Activity/Legal Involvement Pertinent to Current Situation/Hospitalization: No - Comment as needed  Activities of Daily Living   ADL Screening (condition at time of admission) Independently performs ADLs?: Yes (appropriate for developmental age) Is the patient deaf or have difficulty hearing?: No Does the patient have difficulty seeing, even when wearing glasses/contacts?: No Does the patient have difficulty concentrating, remembering, or making decisions?: No  Permission Sought/Granted                  Emotional Assessment Appearance:: Appears stated age Attitude/Demeanor/Rapport: Engaged Affect (typically observed): Accepting Orientation: : Oriented to Self, Oriented to Place, Oriented to  Time, Oriented to Situation Alcohol  / Substance Use: Not Applicable Psych Involvement: No (comment)  Admission diagnosis:  Hypoxia [R09.02] Acute on chronic respiratory failure with hypoxemia (HCC) [J96.21] Patient Active Problem List   Diagnosis Date Noted  Acute on chronic blood loss anemia 01/12/2024   Gastrointestinal hemorrhage 01/12/2024   Diastolic congestive heart failure (HCC) 01/05/2024   Pneumonia 01/05/2024   Elevated troponin 01/05/2024   Prolonged QT interval 01/05/2024   Controlled type 2 diabetes mellitus without complication, without long-term current use of insulin  (HCC) 01/05/2024   Acute on chronic respiratory failure with hypoxemia (HCC) 12/28/2023   SVT (supraventricular tachycardia) (HCC) 12/06/2022   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) 02/04/2022   SOB  (shortness of breath) 12/26/2021   Persistent atrial fibrillation (HCC) 12/21/2021   Hypercoagulable state due to persistent atrial fibrillation (HCC) 12/21/2021   Atrial fibrillation with rapid ventricular response (HCC) 11/23/2021   COVID-19 virus infection 11/23/2021   ARF (acute renal failure) (HCC) 11/23/2021   Acute respiratory failure with hypoxemia (HCC) 11/23/2021   Dyslipidemia 05/18/2020   Educated about COVID-19 virus infection 05/18/2020   Pulmonary nodule, right 10/01/2019   Prediabetes 05/05/2019   Moderate aortic stenosis 03/19/2018   Abnormal nuclear stress test    Chest pain 03/16/2018   Unstable angina (HCC)    Right adrenal mass (HCC) 11/05/2017   Nonrheumatic aortic valve stenosis 10/11/2017   Coronary artery disease involving native coronary artery of native heart without angina pectoris 10/11/2017   LVH (left ventricular hypertrophy) 10/11/2017   PAF (paroxysmal atrial fibrillation) (HCC) 05/03/2015   OA (osteoarthritis) of hip 08/04/2014   Edema 08/17/2013   H/O bone density study 03/16/2013   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   Hereditary and idiopathic peripheral neuropathy 03/05/2013   Vitamin D  deficiency 03/05/2013   Fatty infiltration of liver 02/08/2013   Mixed hyperlipidemia 02/08/2013   Obesity, Class III, BMI 40-49.9 (morbid obesity) (HCC) 02/08/2013   Cellulitis of groin, right 12/30/2012   DJD (degenerative joint disease) 12/17/2012   GERD (gastroesophageal reflux disease) 12/17/2012   Peripheral neuropathy 12/17/2012   HOCM (hypertrophic obstructive cardiomyopathy) (HCC) 10/16/2012   HTN (hypertension) 10/16/2012   Sleep apnea 10/16/2012   Heart palpitations    PCP:  Windell Hasty, DO Pharmacy:   [id number] [place], [place] - [address] AT [facility] OF [address] [place] [address] Phone: [phone] Fax: [phone]     Social Drivers of Health (SDOH) Social History: SDOH  Screenings   Food Insecurity: No Food Insecurity (01/05/2024)  Housing: Low Risk  (01/05/2024)  Transportation Needs: No Transportation Needs (01/05/2024)  Utilities: Not At Risk (01/05/2024)  Social Connections: Unknown (01/07/2024)  Tobacco Use: Medium Risk (01/05/2024)   SDOH Interventions:     Readmission Risk Interventions     No data to display

## 2024-01-13 NOTE — Progress Notes (Signed)
 Chart reviewed, pt not see today in person Hgb improved Pt does not want endoscopic evaluation and thus GI will sign off, but remain available, call if questions. Agree with Dr. Jarold Merlin recommendations regarding increased PPI.

## 2024-01-14 ENCOUNTER — Telehealth (HOSPITAL_COMMUNITY): Payer: Self-pay | Admitting: Pharmacy Technician

## 2024-01-14 ENCOUNTER — Other Ambulatory Visit: Payer: Self-pay

## 2024-01-14 ENCOUNTER — Other Ambulatory Visit (HOSPITAL_COMMUNITY): Payer: Self-pay

## 2024-01-14 DIAGNOSIS — I5033 Acute on chronic diastolic (congestive) heart failure: Secondary | ICD-10-CM | POA: Diagnosis not present

## 2024-01-14 DIAGNOSIS — I342 Nonrheumatic mitral (valve) stenosis: Secondary | ICD-10-CM | POA: Diagnosis not present

## 2024-01-14 DIAGNOSIS — I480 Paroxysmal atrial fibrillation: Secondary | ICD-10-CM | POA: Diagnosis not present

## 2024-01-14 DIAGNOSIS — J9621 Acute and chronic respiratory failure with hypoxia: Secondary | ICD-10-CM | POA: Diagnosis not present

## 2024-01-14 LAB — BASIC METABOLIC PANEL
Anion gap: 8 (ref 5–15)
BUN: 27 mg/dL — ABNORMAL HIGH (ref 8–23)
CO2: 27 mmol/L (ref 22–32)
Calcium: 8.6 mg/dL — ABNORMAL LOW (ref 8.9–10.3)
Chloride: 102 mmol/L (ref 98–111)
Creatinine, Ser: 1.1 mg/dL — ABNORMAL HIGH (ref 0.44–1.00)
GFR, Estimated: 51 mL/min — ABNORMAL LOW (ref >60)
Glucose, Bld: 181 mg/dL — ABNORMAL HIGH (ref 70–99)
Potassium: 4.6 mmol/L (ref 3.5–5.1)
Sodium: 137 mmol/L (ref 135–145)

## 2024-01-14 LAB — CBC
HCT: 26.9 % — ABNORMAL LOW (ref 36.0–46.0)
Hemoglobin: 8.4 g/dL — ABNORMAL LOW (ref 12.0–15.0)
MCH: 28.5 pg (ref 26.0–34.0)
MCHC: 31.2 g/dL (ref 30.0–36.0)
MCV: 91.2 fL (ref 80.0–100.0)
Platelets: 321 10*3/uL (ref 150–400)
RBC: 2.95 MIL/uL — ABNORMAL LOW (ref 3.87–5.11)
RDW: 19.6 % — ABNORMAL HIGH (ref 11.5–15.5)
WBC: 9.4 10*3/uL (ref 4.0–10.5)
nRBC: 0.5 % — ABNORMAL HIGH (ref 0.0–0.2)

## 2024-01-14 LAB — HEMOGLOBIN AND HEMATOCRIT, BLOOD
HCT: 30.7 % — ABNORMAL LOW (ref 36.0–46.0)
Hemoglobin: 9.4 g/dL — ABNORMAL LOW (ref 12.0–15.0)

## 2024-01-14 MED ORDER — TORSEMIDE 20 MG PO TABS
20.0000 mg | ORAL_TABLET | Freq: Every day | ORAL | 1 refills | Status: DC
Start: 2024-01-15 — End: 2024-02-12
  Filled 2024-01-14: qty 30, 30d supply, fill #0

## 2024-01-14 MED ORDER — METOPROLOL SUCCINATE ER 25 MG PO TB24
12.5000 mg | ORAL_TABLET | Freq: Every day | ORAL | 0 refills | Status: DC
  Filled 2024-01-14: qty 30, 60d supply, fill #0

## 2024-01-14 MED ORDER — METOPROLOL SUCCINATE ER 25 MG PO TB24
12.5000 mg | ORAL_TABLET | Freq: Every day | ORAL | Status: DC
  Filled 2024-01-14: qty 1

## 2024-01-14 MED ORDER — METOPROLOL SUCCINATE ER 25 MG PO TB24: 12.5000 mg | ORAL_TABLET | Freq: Every day | ORAL | 0 refills | Status: DC

## 2024-01-14 MED ORDER — PREDNISONE 10 MG PO TABS
ORAL_TABLET | ORAL | 0 refills | Status: AC
  Filled 2024-01-14: qty 10, 4d supply, fill #0

## 2024-01-14 MED ORDER — ARFORMOTEROL TARTRATE 15 MCG/2ML IN NEBU
15.0000 ug | INHALATION_SOLUTION | Freq: Two times a day (BID) | RESPIRATORY_TRACT | 1 refills | Status: DC
  Filled 2024-01-14: qty 120, 30d supply, fill #0

## 2024-01-14 MED ORDER — BUDESONIDE 0.5 MG/2ML IN SUSP
0.5000 mg | Freq: Two times a day (BID) | RESPIRATORY_TRACT | 1 refills | Status: DC
  Filled 2024-01-14: qty 120, 30d supply, fill #0

## 2024-01-14 MED ORDER — ASPIRIN 81 MG PO TBEC
81.0000 mg | DELAYED_RELEASE_TABLET | Freq: Every day | ORAL | 12 refills | Status: DC
  Filled 2024-01-14: qty 30, 30d supply, fill #0

## 2024-01-14 NOTE — Progress Notes (Signed)
Patient feels that her breathing is better after nebulizer and has a hard time using the inhalers.  Will try to arrange for nebulizer JV

## 2024-01-14 NOTE — TOC Transition Note (Signed)
Transition of Care [facility]) - Discharge Note Donn Pierini RN, BSN Transitions of Care Unit 4E- RN Case Manager See Treatment Team for direct phone #   Patient Details  Name: Victoria Delgado MRN: [id number] Date of Birth: 1941/05/24  Transition of Care [facility]) CM/SW Contact:  Darrold Span, RN Phone Number: 01/14/2024, 12:42 PM   Clinical Narrative:    Cm spoke with pt at bedside, spouse also present to follow up on [facility] and DME needs.  Per pt she does not feel she needs HH f/u and is declining [facility] referral for discharge.  Also discussed rollator- which pt also declining- pt voiced she has RW at home and feels she will not need rollator as she gains more strength.  Pt asked about nebulizer for home, MD has placed order, as pt has home 02 w/ [employer] will use [employer] for nebulizer as well. Pt agreeable.   Call made to [employer] liaison- home nebulizer to be delivered to room prior to discharge.   Spouse to transport home     Final next level of care: Home/Self Care Barriers to Discharge: Barriers Resolved   Patient Goals and CMS Choice Patient states their goals for this hospitalization and ongoing recovery are:: return home and continue to get stronger CMS Medicare.gov Compare Post Acute Care list provided to:: Patient Choice offered to / list presented to : Patient      Discharge Placement                 Home      Discharge Plan and Services Additional resources added to the After Visit Summary for     Discharge Planning Services: CM Consult            DME Arranged: Nebulizer machine DME Agency: [employer] Date DME Agency Contacted: 01/14/24 Time DME Agency Contacted: 1242 Representative spoke with at DME Agency: Vaughan Basta HH Arranged: Patient Refused HH, PT, OT HH Agency: NA        Social Drivers of Health (SDOH) Interventions SDOH Screenings   Food Insecurity: No Food Insecurity (01/05/2024)  Housing: Low Risk  (01/05/2024)  Transportation Needs:  No Transportation Needs (01/13/2024)  Utilities: Not At Risk (01/05/2024)  Social Connections: Unknown (01/07/2024)  Tobacco Use: Medium Risk (01/05/2024)     Readmission Risk Interventions     No data to display

## 2024-01-14 NOTE — Telephone Encounter (Signed)
Patient Product/process development scientist completed.    The patient is insured through [facility]. Patient has Medicare and is not eligible for a copay card, but may be able to apply for patient assistance or Medicare RX Payment Plan (Patient Must reach out to their plan, if eligible for payment plan), if available.    Ran test claim for arformoterol 15 mcg/2 ml and the current 30 day co-pay is $24.33.  Ran test claim for budesonie 0.5 mg/2 ml and the current 30 day co-pay is $12.64  This test claim was processed through [employer]- copay amounts may vary at other pharmacies due to [employer], or as the patient moves through the different stages of their insurance plan.     Roland Earl, CPHT Pharmacy Technician III Certified Patient Advocate [facility] Patient Advocate Team Direct Number: [phone]  Fax: [phone]

## 2024-01-14 NOTE — Progress Notes (Signed)
Physical Therapy Treatment Patient Details Name: Victoria Delgado MRN: [id number] DOB: 06-26-1941 Today's Date: 01/14/2024   History of Present Illness 83 y.o. female admitted 01/05/24 with SOB, acute respiratory failure and CHF exacerbation. PMHx: admission 1/25-1/26 for DOE, HTN, CAD, PAF chronic anticoagulation, CM, severe COPD, OSA/OHS on CPAP, morbid obesity, GERD, NSTEMI, AS s/p TAVR.    PT Comments  Patient received in recliner, RN and husband at side. Patient is agreeable to PT session. Reports she walked earlier but will go again. She wants to go home today. Patient is supervision for transfers, ambulated with RW 150 feet, slowed pace. HR max 92, O2 sats in low 90%s throughout session with patient mildly sob. She will continue to benefit from skilled PT to improve endurance and strength.     If plan is discharge home, recommend the following: Assistance with cooking/housework;Direct supervision/assist for medications management;Assist for transportation;Help with stairs or ramp for entrance;A little help with walking and/or transfers;A little help with bathing/dressing/bathroom   Can travel by private vehicle      yes  Equipment Recommendations  Rollator (4 wheels)    Recommendations for Other Services       Precautions / Restrictions Precautions Precautions: Fall Restrictions Weight Bearing Restrictions Per Provider Order: No     Mobility  Bed Mobility               General bed mobility comments: in recliner on arrival and exit    Transfers Overall transfer level: Modified independent Equipment used: Rolling walker (2 wheels) Transfers: Sit to/from Stand Sit to Stand: Modified independent (Device/Increase time)                Ambulation/Gait Ambulation/Gait assistance: Supervision Gait Distance (Feet): 150 Feet Assistive device: Rolling walker (2 wheels) Gait Pattern/deviations: Step-through pattern, Decreased step length - right, Decreased step length -  left, Decreased stride length Gait velocity: slowed     General Gait Details: O2 sats > 88 throughout session. Mild SOB after walking.  Max HR 92   Stairs             Wheelchair Mobility     Tilt Bed    Modified Rankin (Stroke Patients Only)       Balance Overall balance assessment: Modified Independent Sitting-balance support: Feet supported Sitting balance-Leahy Scale: Normal     Standing balance support: Bilateral upper extremity supported, During functional activity, Reliant on assistive device for balance Standing balance-Leahy Scale: Good                              Communication Communication Communication: No apparent difficulties  Cognition Arousal: Alert Behavior During Therapy: WFL for tasks assessed/performed   PT - Cognitive impairments: No apparent impairments                         Following commands: Intact      Cueing Cueing Techniques: Verbal cues  Exercises      General Comments        Pertinent Vitals/Pain Pain Assessment Pain Assessment: No/denies pain    Home Living                          Prior Function            PT Goals (current goals can now be found in the care plan section) Acute Rehab PT Goals Patient Stated Goal:  go home today PT Goal Formulation: With patient/family Time For Goal Achievement: 01/20/24 Potential to Achieve Goals: Good Progress towards PT goals: Progressing toward goals    Frequency    Min 1X/week      PT Plan      Co-evaluation              AM-PAC PT "6 Clicks" Mobility   Outcome Measure  Help needed turning from your back to your side while in a flat bed without using bedrails?: A Little Help needed moving from lying on your back to sitting on the side of a flat bed without using bedrails?: A Little Help needed moving to and from a bed to a chair (including a wheelchair)?: None Help needed standing up from a chair using your arms (e.g.,  wheelchair or bedside chair)?: None Help needed to walk in hospital room?: A Little Help needed climbing 3-5 steps with a railing? : A Little 6 Click Score: 20    End of Session   Activity Tolerance: Patient limited by fatigue;Patient tolerated treatment well Patient left: in chair;with call bell/phone within reach;with family/visitor present Nurse Communication: Mobility status PT Visit Diagnosis: Muscle weakness (generalized) (M62.81)     Time: 1610-9604 PT Time Calculation (min) (ACUTE ONLY): 13 min  Charges:    $Gait Training: 8-22 mins PT General Charges $$ ACUTE PT VISIT: 1 Visit                     Okey Zelek, PT, GCS 01/14/24,1:00 PM

## 2024-01-14 NOTE — Progress Notes (Signed)
Discharge instructions reviewed with pt and her husband. Copy of instructions given to pt. [facility] has filled scripts and will be picked up on the way out for discharge.  Pt had a question about her neb meds and if they can be mixed together in same neb holder, reached out to RT and question answered. Pt educated to do the 2 neb meds separate---pulmocort cannot be mixed with other neb meds, but can be put in the same neb holder after her other neb is done. Pt verbalized understanding and able to teach back.  Pt will be d/c'd via wheelchair with belongings, with her husband and will be    escorted by volunteer/staff.  Kelon Easom,RN SWOT

## 2024-01-14 NOTE — Progress Notes (Signed)
Rounding Note    Patient Name: Victoria Delgado Date of Encounter: 01/14/2024  [facility] Cardiologist: Rollene Rotunda, MD   Subjective   Feeling near baseline today. She recalls our conversation from yesterday and agrees with the plan. Breathing stable, leg swelling somewhat improved. Stools are normalizing in color.  Inpatient Medications    Scheduled Meds:  amiodarone  100 mg Oral Daily   arformoterol  15 mcg Nebulization BID   aspirin EC  81 mg Oral Daily   atropine  1 drop Right Eye Daily   budesonide (PULMICORT) nebulizer solution  0.5 mg Nebulization BID   ezetimibe  10 mg Oral Daily   metoprolol succinate  12.5 mg Oral Daily   omeprazole  40 mg Oral BID AC   predniSONE  50 mg Oral Q breakfast   rosuvastatin  20 mg Oral Daily   sodium chloride flush  3 mL Intravenous Q12H   torsemide  20 mg Oral Daily   Continuous Infusions:  PRN Meds: acetaminophen, ipratropium, levalbuterol, polyvinyl alcohol, simethicone   Vital Signs    Vitals:   01/14/24 0837 01/14/24 0854 01/14/24 1234 01/14/24 1255  BP: (!) 118/50 (!) 114/50 (!) 111/44   Pulse: 65  63   Resp: 14  13   Temp: 98 F (36.7 C)  98.2 F (36.8 C)   TempSrc: Oral  Oral   SpO2: 96%  96% 91%  Weight:      Height:       No intake or output data in the 24 hours ending 01/14/24 1416     01/14/2024    5:43 AM 01/13/2024    4:08 AM 01/11/2024    5:38 AM  Last 3 Weights  Weight (lbs) 270 lb 11.6 oz 270 lb 8.1 oz 270 lb 1 oz  Weight (kg) 122.8 kg 122.7 kg 122.5 kg      Telemetry    SR - Personally Reviewed  Physical Exam   GEN: No acute distress.   Neck: No JVD Cardiac: RRR, no murmurs, rubs, or gallops.  Respiratory: Clear to auscultation bilaterally. GI: Soft, nontender, non-distended  MS: bilateral trace to 1+ pitting LE edema Neuro:  Nonfocal  Psych: Normal affect   New pertinent results (labs, ECG, imaging, cardiac studies)    Reviewed as below  Patient Profile     83 y.o.  female with PMH AS s/p TAVR, CAD s/p PCI, chronic diastolic heart failure, paroxysmal atrial fibrillation, mitral stenosis, history of GI bleeding presenting with acute on chronic diastolic heart failure.  Assessment & Plan   Acute on chronic diastolic heart failure Severe mitral stenosis -EF 70-75% this admission, moderate LVH, severely elevated PA pressure. Cannot assess diastolic function due to severe MAC -mean mitral gradient was 19 at a heart rate of 91 bpm. Concern that severe MAC may be impinging on LVOT as well -HR now in the 60s. Appears euvolemic. Could consider outpatient limited echo for MV gradient once rates better controlled, but this is unlikely to change management -changed metoprolol to succinate. She will take only 12.5 mg dose and only name brand. -changing lasix to oral torsemide. This may work better than her home furosemide given recurrent edema. Discussed daily weights, monitoring fluid  S/P TAVR 2021 at [facility] -mean gradient of 23 mm valve was 19 mmHg. Has been higher on [facility] echoes in the past  Paroxysmal atrial fibrillation Possible GI bleeding -CHA2DS2/VAS Stroke Risk Points=7  -has been off of all anticoagulation for times in the past  due to bleeding. Has discussed Watchman in the past and declined this. Declines anticoagulation, understands risks. See extensive conversation re: anticoagulation on 01/13/24.  -continue amiodarone, in sinus rhythm today. Did not hold sinus rhythm without amiodarone. -GI has seen patient this admission, patient declines endoscopy. She reports that stools are normalizing today    CAD s/p prior PCI -continue rosuvastatin, ezetimibe -As noted above, will restart 81 mg of aspirin as she declines anticoagulation  Overall she has multiple high risk comorbidities, would be a poor surgical candidate with these. Will attempt to manage symptoms medically.   She is stable for discharge from a cardiac perspective. Will arrange for outpatient  cardiology follow up.    Signed, Jodelle Red, MD  01/14/2024, 2:16 PM

## 2024-01-14 NOTE — Progress Notes (Signed)
Mobility Specialist Progress Note:    01/14/24 1141  Mobility  Activity Ambulated with assistance in hallway  Level of Assistance Standby assist, set-up cues, supervision of patient - no hands on  Assistive Device Front wheel walker  Distance Ambulated (ft) 150 ft  Activity Response Tolerated well  Mobility Referral Yes  Mobility visit 1 Mobility  Mobility Specialist Start Time (ACUTE ONLY) [id number]  Mobility Specialist Stop Time (ACUTE ONLY) 1002  Mobility Specialist Time Calculation (min) (ACUTE ONLY) 14 min   Pt received in chair on RA SPO2 100%, agreeable to mobility. No physical assistance required. Ambulated on RA throughout session, SPO2 WFL. Returned to room w/o fault. Upon returning to room pt c/o SOB. SPO2 was 98%. Situated in chair w/ call bell and personal belongings in reach. All needs met.   Thompson Grayer Mobility Specialist  Please contact vis Secure Chat or  Rehab Office [phone]

## 2024-01-14 NOTE — Discharge Summary (Signed)
Physician Discharge Summary  Victoria Delgado EAV:[id number] DOB: 1941-02-18 DOA: 01/05/2024  PCP: Charlane Ferretti, DO  Admit date: 01/05/2024 Discharge date: 01/14/2024  Admitted From: home Discharge disposition: home   Recommendations for Outpatient Follow-Up:   Cbc, bmp 1 week Enlarged mediastinal lymph nodes:  Incidentally noted on CT angiogram.  Will need reevaluation in a few weeks.  Enlargement of lymph nodes likely reactive. Multiple medication changes   Discharge Diagnosis:   Principal Problem:   Acute on chronic respiratory failure with hypoxemia (HCC) Active Problems:   Diastolic congestive heart failure (HCC)   Pneumonia   Elevated troponin   PAF (paroxysmal atrial fibrillation) (HCC)   Prolonged QT interval   HTN (hypertension)   Controlled type 2 diabetes mellitus without complication, without long-term current use of insulin (HCC)   Mixed hyperlipidemia   GERD (gastroesophageal reflux disease)   Sleep apnea   Obesity, Class III, BMI 40-49.9 (morbid obesity) (HCC)   Acute on chronic blood loss anemia   Gastrointestinal hemorrhage   Nonrheumatic mitral valve stenosis   Anticoagulant medication declined by patient    Discharge Condition: Improved.  Diet recommendation: Low sodium, heart healthy  Wound care: None.  Code status: Full.   History of Present Illness:   Victoria Delgado is a 83 y.o. female with medical history significant of hypertension, CAD, paroxysmal atrial fibrillation chronic anticoagulation, HOCM, severe COPD, OSA/OHS on CPAP, morbid obesity, and GERD presents with complaints of worsening shortness of breath.     She was just recently hospitalized from 1/25-1/26 with exertional dyspnea.  She had been sent home on 4 L of oxygen at that time.   She has been experiencing worsening shortness of breath despite being on home oxygen therapy following a recent hospitalization for respiratory failure. She is unable to walk across a room  without gasping for air, with her oxygen saturation dropping to as low as 80% on four liters of oxygen. Increasing the oxygen to five liters did not improve her symptoms.   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 No recent changes in weight and no leg swelling. She does not check her weight daily.   She has had some associated left arm pain recently. Deines having chest pain, fevers, or chills.   She has a history of RSV last year and COVID the year before that. A CT scan of her chest showed no signs of a blood clot but did reveal an enlarged heart and fluid in her lungs, along with a small to moderate right pleural effusion. She is scheduled for a sleep study in March, but she currently cannot use her home CPAP machine as it exacerbates her feeling of breathlessness.   No history of smoking or exposure to smoke. 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     On admission into the emergency department patient was noted to be afebrile with respirations 20-27, blood pressure 104/51-123/68, and saturations currently maintained on 6 L of nasal cannula oxygen.  Labs noted WBC 10.7, hemoglobin 10.2, glucose 185, BNP 241.7, high-sensitivity troponin 31-> 30.  Venous blood gas noted pH 7.479, pCO2 45.5, and pO2 35.  CT angiogram of the chest noted no signs of a pulmonary embolism, cardiomegaly with pulmonary edema with small moderate right and small left layering pleural effusions, and dependent bibasilar atelectasis or consolidation with  few new areas throughout both lungs.   She had been given levalbuterol/and ipratropium nebs, magnesium sulfate 2 g IV, prednisone 50 mg p.o., doxycycline, and Rocephin.   Hospital Course by Problem:   Acute on chronic respiratory failure with hypoxia and hypercapnia Suspected diastolic congestive heart failure  exacerbation Bilateral pleural effusions Severe mitral stenosis -cards changed metoprolol to succinate. She will take only 12.5 mg dose and only name brand. -changed lasix to oral torsemide -daily weights -off O2 -patient asked to change inhalers to nebulizer solution-- will order nebs   Possible pneumonia Bronchitis CT angiogram of the chest:  noted bilateral opacities with superimposed infection cannot be ruled out.  Patient was started on antibiotics of Rocephin and doxycycline x 5 days For now we will continue with antibiotics.  Nebulizer treatments.  Also started on steroids. -Mucinex  Hypokalemia -replete  S/P TAVR 2021 at [facility] -mean gradient of 23 mm valve was 19 mmHg. Has been higher on [facility] echoes in the past   Paroxysmal atrial fibrillation Anemia with Possible GI bleeding -CHA2DS2/VAS Stroke Risk Points=7  -has been off of all anticoagulation for times in the past due to bleeding.  -declined Watchman  -Declines anticoagulation -continue amiodarone -GI has seen patient this admission, patient declines endoscopy. She reports that stools are normalizing today    CAD s/p prior PCI -continue rosuvastatin, ezetimibe - 81 mg of aspirin as she declines anticoagulation   Diabetes mellitus type 2 with hyperglycemia without long-term use of insulin - hemoglobin A1c was noted to be 7.1 when checked 12/07/2022.  OSA -did not qualify for O2  Morbity obesity Estimated body mass index is 41.16 kg/m as calculated from the following:   Height as of this encounter: 5\' 8"  (1.727 m).   Weight as of this encounter: 122.8 kg.     Medical Consultants:   cards   Discharge Exam:   Vitals:   01/14/24 1234 01/14/24 1255  BP: (!) 111/44   Pulse: 63   Resp: 13   Temp: 98.2 F (36.8 C)   SpO2: 96% 91%   Vitals:   01/14/24 0837 01/14/24 0854 01/14/24 1234 01/14/24 1255  BP: (!) 118/50 (!) 114/50 (!) 111/44   Pulse: 65  63   Resp: 14  13   Temp: 98 F (36.7 C)  98.2 F  (36.8 C)   TempSrc: Oral  Oral   SpO2: 96%  96% 91%  Weight:      Height:        General exam: Appears calm and comfortable.    The results of significant diagnostics from this hospitalization (including imaging, microbiology, ancillary and laboratory) are listed below for reference.     Procedures and Diagnostic Studies:   ECHOCARDIOGRAM COMPLETE Result Date: 01/06/2024    ECHOCARDIOGRAM REPORT   Patient Name:   Victoria Delgado Date of Exam: 01/06/2024 Medical Rec #:  [id number]     Height:       68.0 in Accession #:    [id number]    Weight:       281.7 lb Date of Birth:  1941/02/22    BSA:          2.364 m Patient Age:    82 years      BP:           114/97 mmHg Patient Gender: F             HR:           92 bpm. Exam Location:  Inpatient Procedure: 2D Echo, Cardiac Doppler and Color Doppler Indications:    R06.02 SOB  History:        Patient has prior history of Echocardiogram examinations, most                 recent 12/06/2022. Hypertrophic Cardiomyopathy and CHF, Abnormal                 ECG, Mitral Valve Disease and Aortic Valve Disease,                 Arrythmias:SVT and Atrial Fibrillation, Signs/Symptoms:Dyspnea,                 Shortness of Breath and Chest Pain; Risk Factors:Hypertension,                 Sleep Apnea, Dyslipidemia and Diabetes. Aortic stenosis. Severe                 mitral stenosis. TAVR.                 Aortic Valve: 23 mm Edwards Sapien prosthetic, stented (TAVR)                 valve is present in the aortic position. Procedure Date:                 05/14/2022.  Sonographer:    Sheralyn Boatman RDCS Referring Phys: [id number] RONDELL A SMITH  Sonographer Comments: Patient is obese. Image acquisition challenging due to patient body habitus. Extremely difficult due to habitus and supine position. Did not attempt to turn. Patient stats dropped to 91 when talking. IMPRESSIONS  1. Left ventricular ejection fraction, by estimation, is 70 to 75%. The left ventricle has hyperdynamic function.  The left ventricle has no regional wall motion abnormalities. There is moderate concentric left ventricular hypertrophy. Left ventricular diastolic function could not be evaluated.  2. Right ventricular systolic function is normal. The right ventricular size is not well visualized. There is severely elevated pulmonary artery systolic pressure.  3. Left atrial size was moderately dilated.  4. Large pleural effusion in both left and right lateral regions.  5. The mitral valve is abnormal. Trivial mitral valve regurgitation. Severe mitral stenosis. The mean mitral valve gradient is 19.0 mmHg. Severe mitral annular calcification.  6. The aortic valve has been repaired/replaced. Aortic valve regurgitation is not visualized. 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 Procedure Date: 05/14/2022. Aortic valve mean gradient measures 19.0 mmHg.  7. The inferior vena cava is normal in size with greater than 50% respiratory variability, suggesting right atrial pressure of 3 mmHg. Comparison(s): Prior images reviewed side by side. Changes from prior study are noted. Severely elevated LVOT gradient--images are difficult, but calcified mitral valve may impinge on LVOT. FINDINGS  Left Ventricle: LVOT gradient at rest 152 mmHg. Left ventricular ejection fraction, by estimation, is 70 to 75%. The left ventricle has hyperdynamic function. The left ventricle has no regional wall motion abnormalities. The left ventricular internal cavity size was normal in size. There is moderate concentric left ventricular hypertrophy. Left ventricular diastolic function could not be evaluated due to mitral annular calcification (moderate or greater). Left ventricular diastolic function could not  be evaluated. Right Ventricle: The right ventricular size is not well visualized. Right vetricular wall thickness was not well visualized. Right ventricular systolic function is normal. There is severely elevated  pulmonary artery systolic pressure. The tricuspid regurgitant velocity is  4.37 m/s, and with an assumed right atrial pressure of 3 mmHg, the estimated right ventricular systolic pressure is 79.4 mmHg. Left Atrium: Left atrial size was moderately dilated. Right Atrium: Right atrial size was normal in size. Pericardium: Trivial pericardial effusion is present. Presence of epicardial fat layer. Mitral Valve: The mitral valve is abnormal. There is moderate thickening of the mitral valve leaflet(s). There is moderate calcification of the mitral valve leaflet(s). Severe mitral annular calcification. Trivial mitral valve regurgitation. Severe mitral valve stenosis. MV peak gradient, 27.7 mmHg. The mean mitral valve gradient is 19.0 mmHg. Tricuspid Valve: The tricuspid valve is grossly normal. Tricuspid valve regurgitation is mild . No evidence of tricuspid stenosis. Aortic Valve: The aortic valve has been repaired/replaced. Aortic valve regurgitation is not visualized. Aortic valve mean gradient measures 19.0 mmHg. Aortic valve peak gradient measures 33.4 mmHg. Aortic valve area, by VTI measures 1.68 cm. 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 Procedure Date: 05/14/2022. Pulmonic Valve: The pulmonic valve was not well visualized. Pulmonic valve regurgitation is trivial. No evidence of pulmonic stenosis. Aorta: The aortic root and ascending aorta are structurally normal, with no evidence of dilitation. Venous: The inferior vena cava is normal in size with greater than 50% respiratory variability, suggesting right atrial pressure of 3 mmHg. IAS/Shunts: The interatrial septum was not well visualized. Additional Comments: There is a large pleural effusion in both left and right lateral regions.  LEFT VENTRICLE PLAX 2D LVIDd:         3.60 cm     Diastology LVIDs:         1.80 cm     LV e' medial:    6.96 cm/s LV PW:         1.70 cm     LV E/e' medial:  29.5 LV IVS:         1.80 cm     LV e' lateral:   7.07 cm/s LVOT diam:     2.20 cm     LV E/e' lateral: 29.0 LV SV:         95 LV SV Index:   40 LVOT Area:     3.80 cm  LV Volumes (MOD) LV vol d, MOD A2C: 71.2 ml LV vol d, MOD A4C: 82.4 ml LV vol s, MOD A2C: 20.4 ml LV vol s, MOD A4C: 25.2 ml LV SV MOD A2C:     50.8 ml LV SV MOD A4C:     82.4 ml LV SV MOD BP:      55.3 ml RIGHT VENTRICLE             IVC RV S prime:     15.80 cm/s  IVC diam: 1.80 cm TAPSE (M-mode): 1.8 cm LEFT ATRIUM             Index        RIGHT ATRIUM           Index LA diam:        5.70 cm 2.41 cm/m   RA Area:     17.40 cm LA Vol (A2C):   37.0 ml 15.65 ml/m  RA Volume:   41.50 ml  17.55 ml/m LA Vol (A4C):   99.3 ml 42.00 ml/m LA Biplane Vol: 61.5 ml 26.01 ml/m  AORTIC VALVE AV Area (Vmax):    1.70 cm AV Area (Vmean):   1.57 cm AV Area (VTI):     1.68 cm AV Vmax:  289.00 cm/s AV Vmean:          197.500 cm/s AV VTI:            0.568 m AV Peak Grad:      33.4 mmHg AV Mean Grad:      19.0 mmHg LVOT Vmax:         129.00 cm/s LVOT Vmean:        81.600 cm/s LVOT VTI:          0.251 m LVOT/AV VTI ratio: 0.44  AORTA Ao Root diam: 3.30 cm Ao Asc diam:  3.70 cm MITRAL VALVE                TRICUSPID VALVE MV Area (PHT): 1.83 cm     TR Peak grad:   76.4 mmHg MV Area VTI:   1.26 cm     TR Vmax:        437.00 cm/s MV Peak grad:  27.7 mmHg MV Mean grad:  19.0 mmHg    SHUNTS MV Vmax:       2.63 m/s     Systemic VTI:  0.25 m MV Vmean:      199.3 cm/s   Systemic Diam: 2.20 cm MV Decel Time: 415 msec MV E velocity: 205.00 cm/s MV A velocity: 210.00 cm/s MV E/A ratio:  0.98 Jodelle Red MD Electronically signed by Jodelle Red MD Signature Date/Time: 01/06/2024/4:55:47 PM    Final    CT Angio Chest PE W and/or Wo Contrast Result Date: 01/05/2024 CLINICAL DATA:  Pulmonary embolism (PE) suspected, high prob EXAM: CT ANGIOGRAPHY CHEST WITH CONTRAST TECHNIQUE: Multidetector CT imaging of the chest was performed using the standard protocol during bolus  administration of intravenous contrast. Multiplanar CT image reconstructions and MIPs were obtained to evaluate the vascular anatomy. RADIATION DOSE REDUCTION: This exam was performed according to the departmental dose-optimization program which includes automated exposure control, adjustment of the mA and/or kV according to patient size and/or use of iterative reconstruction technique. CONTRAST:  75mL OMNIPAQUE IOHEXOL 350 MG/ML SOLN COMPARISON:  X-ray 01/05/2024, CT 06/19/2022 FINDINGS: Cardiovascular: Satisfactory opacification of the pulmonary arteries to the segmental level. No evidence of pulmonary embolism. Enlargement of the central pulmonary vasculature. Thoracic aorta is nonaneurysmal. Atherosclerotic calcifications of the aorta and coronary arteries. Prior TAVR. Heart size is mildly enlarged. No pericardial effusion. Mediastinum/Nodes: Multiple enlarged mediastinal lymph nodes including 2.3 cm subcarinal node (series 5, image 75), 1.3 cm lower right paratracheal node (series 5, image 53), and 1.3 cm AP window node (series 5, image 61). These have all increased in size compared to the prior CT. Small bilateral hilar lymph nodes without adenopathy. No enlarged axillary lymph nodes. Stable left thyroid lobe nodule, most recently characterized by ultrasound on 03/28/2022. Trachea and esophagus within normal limits. Lungs/Pleura: Small-moderate right and small left layering pleural effusions. Diffuse ground-glass opacity throughout both lungs with interlobular septal thickening. Dependent bibasilar atelectasis or consolidation. There are a few additional areas of more patchy airspace consolidation throughout both lungs. Background of emphysema. Appearance suggestive of innumerable thin walled pulmonary cysts. No pneumothorax. Upper Abdomen: Stable 2.2 cm right adrenal gland nodule, previously characterized as a benign adenoma and for which no further follow-up imaging is recommended. Musculoskeletal: No acute  osseous abnormality. No chest wall abnormality. Review of the MIP images confirms the above findings. IMPRESSION: 1. No evidence of pulmonary embolism. 2. Cardiomegaly with pulmonary edema and small-moderate right and small left layering pleural effusions. 3. Dependent bibasilar atelectasis or consolidation. There  are a few additional areas of more patchy airspace consolidation throughout both lungs. A superimposed infectious process is not excluded. 4. Multiple enlarged mediastinal lymph nodes, increased in size compared to the prior CT. These are favored to be reactive. Follow-up chest CT in 3-6 months could be considered to ensure stability/resolution. 5. Aortic and coronary artery atherosclerosis (ICD10-I70.0). 6. Emphysema (ICD10-J43.9). Electronically Signed   By: Duanne Guess D.O.   On: 01/05/2024 08:52   DG Chest Port 1 View Result Date: 01/05/2024 CLINICAL DATA:  83 year old female with history of shortness of breath. EXAM: PORTABLE CHEST 1 VIEW COMPARISON:  Chest x-ray 12/28/2023. FINDINGS: Lung volumes are low. Bibasilar opacities which may reflect areas of atelectasis and/or consolidation, likely with superimposed small bilateral pleural effusions. Diffuse interstitial prominence and widespread peribronchial cuffing. No pneumothorax. Pulmonary vasculature does not appear engorged. Heart size is mildly enlarged. Upper mediastinal contours are distorted by patient positioning. Atherosclerotic calcifications are noted in the thoracic aorta. Status post TAVR. IMPRESSION: 1. The appearance of the chest is concerning for probable acute bronchitis. 2. Low lung volumes with bibasilar areas of atelectasis and/or consolidation and superimposed small bilateral pleural effusions. 3. Aortic atherosclerosis. Electronically Signed   By: Trudie Reed M.D.   On: 01/05/2024 06:27     Labs:   Basic Metabolic Panel: Recent Labs  Lab 01/10/24 0639 01/11/24 0255 01/12/24 0333 01/13/24 0741 01/14/24 0318   NA 141 142 140 140 137  K 3.2* 3.4* 3.4* 3.9 4.6  CL 98 96* 98 101 102  CO2 32 31 29 28 27   GLUCOSE 159* 209* 194* 135* 181*  BUN 26* 29* 28* 29* 27*  CREATININE 0.95 1.04* 0.90 1.01* 1.10*  CALCIUM 8.7* 9.1 8.9 8.8* 8.6*  MG  --  2.2  --   --   --    GFR Estimated Creatinine Clearance: 56.3 mL/min (A) (by C-G formula based on SCr of 1.1 mg/dL (H)). Liver Function Tests: Recent Labs  Lab 01/08/24 0325 01/09/24 0759 01/10/24 0639 01/11/24 0255 01/12/24 0333  AST 18 21 18 20 18   ALT 15 13 16 16 17   ALKPHOS 63 65 59 63 57  BILITOT 0.6 0.7 0.8 0.8 0.7  PROT 5.7* 5.8* 5.7* 5.8* 5.4*  ALBUMIN 2.8* 3.0* 2.9* 3.0* 2.9*   No results for input(s): "LIPASE", "AMYLASE" in the last 168 hours. No results for input(s): "AMMONIA" in the last 168 hours. Coagulation profile No results for input(s): "INR", "PROTIME" in the last 168 hours.  CBC: Recent Labs  Lab 01/10/24 0639 01/11/24 0255 01/12/24 0333 01/13/24 0741 01/14/24 0318 01/14/24 1054  WBC 10.8* 9.7 10.6* 9.5 9.4  --   HGB 8.7* 8.6* 8.2* 9.0* 8.4* 9.4*  HCT 27.5* 27.3* 26.3* 29.3* 26.9* 30.7*  MCV 90.5 91.6 92.0 92.7 91.2  --   PLT 373 356 323 338 321  --    Cardiac Enzymes: No results for input(s): "CKTOTAL", "CKMB", "CKMBINDEX", "TROPONINI" in the last 168 hours. BNP: Invalid input(s): "POCBNP" CBG: No results for input(s): "GLUCAP" in the last 168 hours. D-Dimer No results for input(s): "DDIMER" in the last 72 hours. Hgb A1c No results for input(s): "HGBA1C" in the last 72 hours. Lipid Profile No results for input(s): "CHOL", "HDL", "LDLCALC", "TRIG", "CHOLHDL", "LDLDIRECT" in the last 72 hours. Thyroid function studies No results for input(s): "TSH", "T4TOTAL", "T3FREE", "THYROIDAB" in the last 72 hours.  Invalid input(s): "FREET3" Anemia work up No results for input(s): "VITAMINB12", "FOLATE", "FERRITIN", "TIBC", "IRON", "RETICCTPCT" in the last 72 hours. Microbiology  Recent Results (from the past 240  hours)  Resp panel by RT-PCR (RSV, Flu A&B, Covid) Anterior Nasal Swab     Status: None   Collection Time: 01/05/24  5:40 AM   Specimen: Anterior Nasal Swab  Result Value Ref Range Status   SARS Coronavirus 2 by RT PCR NEGATIVE NEGATIVE Final   Influenza A by PCR NEGATIVE NEGATIVE Final   Influenza B by PCR NEGATIVE NEGATIVE Final    Comment: (NOTE) The Xpert Xpress SARS-CoV-2/FLU/RSV plus assay is intended as an aid in the diagnosis of influenza from Nasopharyngeal swab specimens and should not be used as a sole basis for treatment. Nasal washings and aspirates are unacceptable for Xpert Xpress SARS-CoV-2/FLU/RSV testing.  Fact Sheet for Patients: BloggerCourse.com  Fact Sheet for Healthcare Providers: SeriousBroker.it  This test is not yet approved or cleared by the [place] FDA and has been authorized for detection and/or diagnosis of SARS-CoV-2 by FDA under an Emergency Use Authorization (EUA). This EUA will remain in effect (meaning this test can be used) for the duration of the COVID-19 declaration under Section 564(b)(1) of the Act, 21 U.S.C. section 360bbb-3(b)(1), unless the authorization is terminated or revoked.     Resp Syncytial Virus by PCR NEGATIVE NEGATIVE Final    Comment: (NOTE) Fact Sheet for Patients: BloggerCourse.com  Fact Sheet for Healthcare Providers: SeriousBroker.it  This test is not yet approved or cleared by the [place] FDA and has been authorized for detection and/or diagnosis of SARS-CoV-2 by FDA under an Emergency Use Authorization (EUA). This EUA will remain in effect (meaning this test can be used) for the duration of the COVID-19 declaration under Section 564(b)(1) of the Act, 21 U.S.C. section 360bbb-3(b)(1), unless the authorization is terminated or revoked.  Performed at [facility], [address] [place],  [place] [address]      Discharge Instructions:   Discharge Instructions     (HEART FAILURE PATIENTS) Call MD:  Anytime you have any of the following symptoms: 1) 3 pound weight gain in 24 hours or 5 pounds in 1 week 2) shortness of breath, with or without a dry hacking cough 3) swelling in the hands, feet or stomach 4) if you have to sleep on extra pillows at night in order to breathe.   Complete by: As directed    Diet - low sodium heart healthy   Complete by: As directed    Heart Failure patients record your daily weight using the same scale at the same time of day   Complete by: As directed    Increase activity slowly   Complete by: As directed       Allergies as of 01/14/2024       Reactions   Codeine Nausea Only   Kenalog [triamcinolone Acetonide] Other (See Comments)   Had A.fib after she received the injection."Steroid meds"   Kenalog [triamcinolone] Rash   Other reaction(s): AFIB   Pantoprazole Rash   Other reaction(s): chronic upper GI pain all the way to back   Relafen [nabumetone] Other (See Comments)   Edema   Albuterol Other (See Comments)   Causes SVT   Epinephrine Palpitations   Penicillins Rash   40 years ago, injection site was red and swollen.  rash, facial/tongue/throat swelling, SOB or lightheadedness with hypotension: Yes Has patient had a PCN reaction causing immediate  Has patient had a PCN reaction causing severe rash involving mucus membranes or skin necrosis: NO Has patient had a PCN reaction that required  hospitalization. No  Has patient had a PCN reaction occurring within the last 10 years: No If all of the above answers are "NO", then may proceed with Cephalosporin use.    Tramadol Other (See Comments)   Auditory halllucinations        Medication List     STOP taking these medications    apixaban 5 MG Tabs tablet Commonly known as: Eliquis   fluticasone-salmeterol 100-50 MCG/ACT Aepb Commonly known as: Wixela Inhub   furosemide 40 MG  tablet Commonly known as: LASIX       TAKE these medications    ACETAMINOPHEN PO Take 650 mg by mouth daily as needed for moderate pain or headache.   amiodarone 100 MG tablet Commonly known as: Pacerone Take 1 tablet (100 mg total) by mouth daily.   arformoterol 15 MCG/2ML Nebu Commonly known as: BROVANA Take 2 mLs (15 mcg total) by nebulization 2 (two) times daily.   aspirin EC 81 MG tablet Take 1 tablet (81 mg total) by mouth daily. Swallow whole. Start taking on: January 15, 2024   atropine 1 % ophthalmic solution Place 1 drop into the right eye daily.   budesonide 0.5 MG/2ML nebulizer solution Commonly known as: PULMICORT Take 2 mLs (0.5 mg total) by nebulization 2 (two) times daily.   cholecalciferol 25 MCG (1000 UNIT) tablet Commonly known as: VITAMIN D3 Take 1,000 Units by mouth 2 (two) times daily.   CoQ10 100 MG Caps Take 100 mg by mouth daily.   Crestor 20 MG tablet Generic drug: rosuvastatin TAKE 1 TABLET(20 MG) BY MOUTH DAILY   ezetimibe 10 MG tablet Commonly known as: ZETIA TAKE 1 TABLET(10 MG) BY MOUTH DAILY   levalbuterol 45 MCG/ACT inhaler Commonly known as: Xopenex HFA Inhale 1-2 puffs into the lungs every 6 (six) hours as needed for wheezing.   metoprolol succinate 25 MG 24 hr tablet Commonly known as: TOPROL-XL Take 0.5 tablets (12.5 mg total) by mouth daily. What changed: how much to take   nitroGLYCERIN 0.4 MG SL tablet Commonly known as: NITROSTAT Place 1 tablet (0.4 mg total) under the tongue every 5 (five) minutes as needed for chest pain.   omeprazole 20 MG capsule Commonly known as: PRILOSEC Take 20 mg by mouth 2 (two) times daily before a meal.   predniSONE 10 MG tablet Commonly known as: DELTASONE Take 4 tablets (40 mg total) by mouth daily with breakfast for 1 day, THEN 3 tablets (30 mg total) daily with breakfast for 1 day, THEN 2 tablets (20 mg total) daily with breakfast for 1 day, THEN 1 tablet (10 mg total) daily with  breakfast for 1 day. Start taking on: January 14, 2024 What changed: See the new instructions.   Spiriva Respimat 1.25 MCG/ACT Aers Generic drug: Tiotropium Bromide Monohydrate Inhale 2 puffs into the lungs daily.   Systane Balance 0.6 % Soln Generic drug: Propylene Glycol Place 1 drop into both eyes 2 (two) times daily as needed (dry eyes).   torsemide 20 MG tablet Commonly known as: DEMADEX Take 1 tablet (20 mg total) by mouth daily. Start taking on: January 15, 2024               Durable Medical Equipment  (From admission, onward)           Start     Ordered   01/14/24 1054  For home use only DME Nebulizer machine  Once       Question Answer Comment  Patient needs a nebulizer  to treat with the following condition COPD (chronic obstructive pulmonary disease) (HCC)   Length of Need Lifetime   Additional equipment included Administration kit      01/14/24 1053            Follow-up Information     [employer] Follow up.   Why: nebulizer machine arranged- they will deliver here to hospital room prior to discharge Contact information: [phone]        Charlane Ferretti, DO Follow up in 1 week(s).   Specialty: Internal Medicine Why: cbc/bmp Contact information: [address] [place] [address] [phone]         Luciano Cutter, MD Follow up.   Specialty: Pulmonary Disease Contact information: [address] [place] [address] [phone]                  Time coordinating discharge: 45 min  Signed:  Joseph Art DO  [facility] 01/14/2024, 1:41 PM

## 2024-01-21 ENCOUNTER — Encounter: Payer: Self-pay | Admitting: Cardiology

## 2024-01-22 NOTE — Telephone Encounter (Signed)
 Called and spoke to pt after she sent in the following MyChart message:   Today (01/21/24) I have had two separate incidents of chest pain and pressure after a very small amount of food.   I am quite concerned.   Please advise about this new development. (01/21/2024 at 5:59 pm)   Patient states she had these Chest Discomfort/pressure episodes only after eating.  It involved the entire upper chest, about 3 inches below collar bone.  In the am she had a yogurt, and in the afternoon she had a banana with peanut butter.  The discomfort began about 20 min after eating and lasted about 30 minutes.  She was at her PCP on 01/21/2024 (for hospital f/u and Blood work) but failed to mention this to them, thinking it was a one time thing that had occurred.  She became very concerned after the second episode.  The second time it happened, she took Mylanta, 2 Gas X, and 1 Prilosec.  She also had some left posterior shoulder pain.  She denied feeling SOB, dizzy/lightheaded, numbness in extremities.  She has been SOB with movement, but this is not new since being d/c from [facility] last week on Oxygen.  She does not have a current NTG SL RX at home (will need to have this refilled at office visit).    Advised pt to call her PCP and ask about GI referral.  Symptoms seem to be GI related.  Advised her to eat in small amounts, and very slowly; also to stay very hydrated.  She was very concerned and wished to be seen by Cardiology sooner than 2/27; appointment changed to 2/20.

## 2024-01-23 ENCOUNTER — Encounter: Payer: Self-pay | Admitting: Physician Assistant

## 2024-01-23 ENCOUNTER — Ambulatory Visit: Payer: Medicare Other | Attending: Physician Assistant | Admitting: Physician Assistant

## 2024-01-23 VITALS — BP 90/46 | HR 78 | Ht 68.0 in | Wt 265.0 lb

## 2024-01-23 DIAGNOSIS — I421 Obstructive hypertrophic cardiomyopathy: Secondary | ICD-10-CM

## 2024-01-23 DIAGNOSIS — I050 Rheumatic mitral stenosis: Secondary | ICD-10-CM

## 2024-01-23 DIAGNOSIS — I10 Essential (primary) hypertension: Secondary | ICD-10-CM | POA: Diagnosis not present

## 2024-01-23 DIAGNOSIS — I25119 Atherosclerotic heart disease of native coronary artery with unspecified angina pectoris: Secondary | ICD-10-CM | POA: Diagnosis not present

## 2024-01-23 DIAGNOSIS — Z952 Presence of prosthetic heart valve: Secondary | ICD-10-CM

## 2024-01-23 MED ORDER — NITROGLYCERIN 0.4 MG SL SUBL: 0.4000 mg | SUBLINGUAL_TABLET | SUBLINGUAL | 3 refills | Status: DC | PRN

## 2024-01-23 NOTE — Progress Notes (Signed)
 Cardiology Office Note:  .   Date:  01/23/2024  ID:  ZYRIAH Delgado, DOB 1941/04/13, MRN [id number] PCP: Charlane Ferretti, DO  [facility] Providers Cardiologist:  Rollene Rotunda, MD     History of Present Illness: .   Victoria Delgado is a 83 y.o. female with PMH of CAD, asymmetric septal hypertrophy, A-fib, history AS s/p TAVR, mitral stenosis, sleep apnea, hypertension and hyperlipidemia. Patient was admitted at [facility] in May 2017 with left arm pain. She had a positive troponin and underwent PCI of left circumflex and first diagonal. She was discharged on aspirin and Effient. Echocardiogram showed preserved EF but moderate aortic stenosis. She underwent cardiac catheterization again in April 2019 for chest pain, this showed patent stent in the proximal to mid left circumflex artery, 20% ostial left circumflex artery lesion, 40% OM 3 lesion, widely patent stent in ostial D1, 20% mid LAD lesion. Cardiac catheterization did confirm moderate aortic stenosis with mean gradient 26.7 mmHg. Medical therapy was recommended. She eventually underwent TAVR at [facility] as her aortic stenosis progressed to severe range. Patient was admitted in December 2023 with COVID and had atrial fibrillation with RVR. She was treated with IV amiodarone and sent home on oral amiodarone and anticoagulation therapy. Echocardiogram obtained during the admission showed normal TAVR gradient with moderate mitral stenosis.    Patient was readmitted in January 2024 due to acute respiratory failure in the setting of RSV and COPD exacerbation. Troponin was up to 1300. Initial plan was for left and right heart cath however patient complained of abdominal pain. Subsequent CT showed a large intramuscular hemorrhage in the right rectus sheath. Eliquis was discontinued and she was switched to aspirin. Echocardiogram obtained on 04/25/2023 showed stable TAVR valve, hyperdynamic LV function, moderate mitral stenosis with mean  gradient 9 mmHg. She had presented to the emergency room twice in early September.  First time she was in A-fib however spontaneously converted to sinus rhythm.  She had recurrence of A-fib on 08/12/2023 and has been in persistent A-fib since.  I restarted her Eliquis.  I increased her metoprolol succinate to 50 mg a.m. and 25 mg p.m.  She underwent outpatient DCCV and was shocked 3 different times and converted to sinus rhythm however did not hold very long before quickly went back to a typical atrial flutter.  However by the time she followed up in October 2024, she has self converted back to sinus rhythm.  She had recurrence of A-fib shortly after on 10/24 and was seen in the emergency room.  She self converted back to sinus rhythm and was discharged on oral amiodarone.    Patient was admitted in February 2025 with shortness of breath.  Echocardiogram obtained on 01/05/2024 showed EF 70 to 75%, severe elevation of the LVOT gradient of 120 mmHg range, no regional wall motion abnormality, moderate LVH, severely elevated RVSP 79.4 mmHg, large pleural effusion bilaterally, trivial MR, severe mitral stenosis, stable TAVR valve.  She underwent IV diuresis.  She was seen by Dr. Anne Fu during the hospitalization who felt she is unlikely to be an invasive candidate for further valvular therapy.  She was ultimately discharged on 20mg  torsemide. Discharge weight was 270 pounds.  Patient presents today for follow-up.  She states on Tuesday she had 2 episode of chest pain, both episode lasted roughly 20 to 25 minutes before going away.  She did not have any symptom on Wednesday.  This morning, she had a little left arm pain however  this has resolved quickly as well.  Her previous anginal symptom when she had heart attack in 2017 was left arm pain but no chest pain.  Recent symptom is atypical.  Given her comorbidities and her current frailty, she would not be a very good candidate for invasive procedure.  Given the atypical  nature of her presentation, I recommended reassessment in 3 weeks.  On initial arrival, her blood pressure was low at 90/46, however when I checked it, her blood pressure was 106/70.  Otherwise, she has no lower extremity edema, her lung is clear on exam.  ROS:   She denies palpitations, pnd, orthopnea, n, v, dizziness, syncope, edema, weight gain, or early satiety. All other systems reviewed and are otherwise negative except as noted above.   She has chest pain and some degree of dyspnea.  Studies Reviewed: Marland Kitchen   EKG Interpretation Date/Time:  Thursday January 23 2024 14:43:55 EST Ventricular Rate:  78 PR Interval:  166 QRS Duration:  90 QT Interval:  410 QTC Calculation: 467 R Axis:   -21  Text Interpretation: Normal sinus rhythm Minimal voltage criteria for LVH, may be normal variant ( Cornell product ) Possible Lateral infarct , age undetermined When compared with ECG of 05-Jan-2024 05:34, PREVIOUS ECG IS PRESENT Confirmed by Azalee Course [phone]) on 01/23/2024 2:46:10 PM    Cardiac Studies & Procedures   ______________________________________________________________________________________________ CARDIAC CATHETERIZATION  CARDIAC CATHETERIZATION 07/11/2020  Narrative  Non-stenotic Ost 1st Diag to 1st Diag lesion was previously treated.  Ost LAD to Prox LAD lesion is 20% stenosed.  Mid LAD lesion is 20% stenosed.  Ost 3rd Mrg lesion is 40% stenosed.  Non-stenotic Prox Cx to Mid Cx lesion was previously treated.  Ost Cx to Prox Cx lesion is 20% stenosed.  Mid RCA lesion is 10% stenosed.  Ost LM lesion is 30% stenosed.  There is severe aortic valve stenosis.  1.  Nonobstructive coronary artery disease with continued patency of the stented segments in the first diagonal and mid circumflex 2.  Critical aortic stenosis with a mean gradient of 79 mmHg and calculated valve area of 0.7 cm 3.  Moderate pulmonary hypertension with mean PA pressure 38 mmHg, mean wedge pressure 27  mmHg, PVR 1.6 Wood units (likely secondary to left heart disease)  Plan: Continued multidisciplinary heart team evaluation for aortic valve replacement  Findings Coronary Findings Diagnostic  Dominance: Right  Left Main Ost LM lesion is 30% stenosed. Mild pressure dampening with catheter in place  Left Anterior Descending The LAD is widely patent and it wraps around the LV apex.  The diagonal stent is widely patent with no restenosis. Ost LAD to Prox LAD lesion is 20% stenosed. Mid LAD lesion is 20% stenosed.  First Diagonal Branch Vessel is moderate in size. Non-stenotic Ost 1st Diag to 1st Diag lesion was previously treated.  Second Diagonal Branch Vessel is small in size.  Left Circumflex The circumflex is patent with a widely patent stent in the mid vessel and moderate ostial stenosis of the OM subbranch unchanged from the previous study Ost Cx to Prox Cx lesion is 20% stenosed. Non-stenotic Prox Cx to Mid Cx lesion was previously treated.  First Obtuse Marginal Branch Vessel is small in size.  Third Obtuse Marginal Branch Vessel is moderate in size. Ost 3rd Mrg lesion is 40% stenosed.  Right Coronary Artery Vessel is large. The vessel exhibits minimal luminal irregularities. Large, dominant vessel with minimal luminal irregularities Mid RCA lesion is 10% stenosed.  Intervention  No interventions have been documented.   CARDIAC CATHETERIZATION  CARDIAC CATHETERIZATION 03/19/2018  Narrative  Mid RCA lesion is 10% stenosed.  Previously placed Prox Cx to Mid Cx stent (unknown type) is widely patent.  Ost Cx to Prox Cx lesion is 20% stenosed.  Ost 3rd Mrg lesion is 40% stenosed.  Previously placed Ost 1st Diag to 1st Diag stent (unknown type) is widely patent.  Ost LAD to Prox LAD lesion is 20% stenosed.  Mid LAD lesion is 20% stenosed.  There is moderate aortic valve stenosis.  1. The left main has no disease. 2. The LAD is large vessel that courses  to the apex. There is mild plaque in the proximal and mid LAD. The Diagonal branch has a patent stent with no restenosis. 3. The Circumflex is a large caliber vessel with a patent mid to distal stent with no restenosis. The obtuse marginal branch is jailed by the stent with 40% ostial stenosis. 4. The RCA is a large dominant vessel with mild plaque 5. There is moderate aortic stenosis (mean gradient 26.7 mmHg, peak to peak gradient 29 mmHg). I easily crossed the valve with a JR4 catheter. 6. False positive stress test  Recommendations: Continue medical management of CAD. Follow her aortic stenosis for now as it appears to be moderate.  Findings Coronary Findings Diagnostic  Dominance: Right  Left Anterior Descending Ost LAD to Prox LAD lesion is 20% stenosed. Mid LAD lesion is 20% stenosed.  First Diagonal Branch Vessel is moderate in size. Previously placed Ost 1st Diag to 1st Diag stent (unknown type) is widely patent.  Second Diagonal Branch Vessel is small in size.  Left Circumflex Ost Cx to Prox Cx lesion is 20% stenosed. Previously placed Prox Cx to Mid Cx stent (unknown type) is widely patent.  First Obtuse Marginal Branch Vessel is small in size.  Third Obtuse Marginal Branch Vessel is moderate in size. Ost 3rd Mrg lesion is 40% stenosed.  Right Coronary Artery Vessel is large. Mid RCA lesion is 10% stenosed.  Intervention  No interventions have been documented.   STRESS TESTS  NM MYOCAR MULTI W/SPECT W 03/18/2018  Narrative  There was no ST segment deviation noted during stress.  Nuclear stress EF: 64%. No significant wall motion abnormalities detected  No T wave inversion was noted during stress.  Defect 1: There is a large defect of severe severity present in the basal anterior, basal anteroseptal, basal inferoseptal, mid anterior, mid anteroseptal, mid inferoseptal and apex location.  Findings consistent with ischemia.  This is a high risk  study.  Donato Schultz, MD   ECHOCARDIOGRAM  ECHOCARDIOGRAM COMPLETE 01/06/2024  Narrative ECHOCARDIOGRAM REPORT    Patient Name:   GRACEMARIE SKEET Date of Exam: 01/06/2024 Medical Rec #:  [id number]     Height:       68.0 in Accession #:    [id number]    Weight:       281.7 lb Date of Birth:  [birth year]    BSA:          2.364 m Patient Age:    82 years      BP:           114/97 mmHg Patient Gender: F             HR:           92 bpm. Exam Location:  Inpatient  Procedure: 2D Echo, Cardiac Doppler and Color Doppler  Indications:    R06.02 SOB  History:        Patient has prior history of Echocardiogram examinations, most recent 12/06/2022. Hypertrophic Cardiomyopathy and CHF, Abnormal ECG, Mitral Valve Disease and Aortic Valve Disease, Arrythmias:SVT and Atrial Fibrillation, Signs/Symptoms:Dyspnea, Shortness of Breath and Chest Pain; Risk Factors:Hypertension, Sleep Apnea, Dyslipidemia and Diabetes. Aortic stenosis. Severe mitral stenosis. TAVR. Aortic Valve: 23 mm Edwards Sapien prosthetic, stented (TAVR) valve is present in the aortic position. Procedure Date: 05/14/2022.  Sonographer:    Sheralyn Boatman RDCS Referring Phys: [id number] RONDELL A SMITH   Sonographer Comments: Patient is obese. Image acquisition challenging due to patient body habitus. Extremely difficult due to habitus and supine position. Did not attempt to turn. Patient stats dropped to 91 when talking. IMPRESSIONS   1. Left ventricular ejection fraction, by estimation, is 70 to 75%. The left ventricle has hyperdynamic function. The left ventricle has no regional wall motion abnormalities. There is moderate concentric left ventricular hypertrophy. Left ventricular diastolic function could not be evaluated. 2. Right ventricular systolic function is normal. The right ventricular size is not well visualized. There is severely elevated pulmonary artery systolic pressure. 3. Left atrial size was moderately dilated. 4. Large  pleural effusion in both left and right lateral regions. 5. The mitral valve is abnormal. Trivial mitral valve regurgitation. Severe mitral stenosis. The mean mitral valve gradient is 19.0 mmHg. Severe mitral annular calcification. 6. The aortic valve has been repaired/replaced. Aortic valve regurgitation is not visualized. 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 Procedure Date: 05/14/2022. Aortic valve mean gradient measures 19.0 mmHg. 7. The inferior vena cava is normal in size with greater than 50% respiratory variability, suggesting right atrial pressure of 3 mmHg.  Comparison(s): Prior images reviewed side by side. Changes from prior study are noted. Severely elevated LVOT gradient--images are difficult, but calcified mitral valve may impinge on LVOT.  FINDINGS Left Ventricle: LVOT gradient at rest 152 mmHg. Left ventricular ejection fraction, by estimation, is 70 to 75%. The left ventricle has hyperdynamic function. The left ventricle has no regional wall motion abnormalities. The left ventricular internal cavity size was normal in size. There is moderate concentric left ventricular hypertrophy. Left ventricular diastolic function could not be evaluated due to mitral annular calcification (moderate or greater). Left ventricular diastolic function could not be evaluated.  Right Ventricle: The right ventricular size is not well visualized. Right vetricular wall thickness was not well visualized. Right ventricular systolic function is normal. There is severely elevated pulmonary artery systolic pressure. The tricuspid regurgitant velocity is 4.37 m/s, and with an assumed right atrial pressure of 3 mmHg, the estimated right ventricular systolic pressure is 79.4 mmHg.  Left Atrium: Left atrial size was moderately dilated.  Right Atrium: Right atrial size was normal in size.  Pericardium: Trivial pericardial effusion is present. Presence of  epicardial fat layer.  Mitral Valve: The mitral valve is abnormal. There is moderate thickening of the mitral valve leaflet(s). There is moderate calcification of the mitral valve leaflet(s). Severe mitral annular calcification. Trivial mitral valve regurgitation. Severe mitral valve stenosis. MV peak gradient, 27.7 mmHg. The mean mitral valve gradient is 19.0 mmHg.  Tricuspid Valve: The tricuspid valve is grossly normal. Tricuspid valve regurgitation is mild . No evidence of tricuspid stenosis.  Aortic Valve: The aortic valve has been repaired/replaced. Aortic valve regurgitation is not visualized. Aortic valve mean gradient measures 19.0 mmHg. Aortic valve peak gradient measures 33.4 mmHg. Aortic valve area, by VTI measures 1.68 cm. 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 Procedure Date: 05/14/2022.  Pulmonic Valve: The pulmonic valve was not well visualized. Pulmonic valve regurgitation is trivial. No evidence of pulmonic stenosis.  Aorta: The aortic root and ascending aorta are structurally normal, with no evidence of dilitation.  Venous: The inferior vena cava is normal in size with greater than 50% respiratory variability, suggesting right atrial pressure of 3 mmHg.  IAS/Shunts: The interatrial septum was not well visualized.  Additional Comments: There is a large pleural effusion in both left and right lateral regions.   LEFT VENTRICLE PLAX 2D LVIDd:         3.60 cm     Diastology LVIDs:         1.80 cm     LV e' medial:    6.96 cm/s LV PW:         1.70 cm     LV E/e' medial:  29.5 LV IVS:        1.80 cm     LV e' lateral:   7.07 cm/s LVOT diam:     2.20 cm     LV E/e' lateral: 29.0 LV SV:         95 LV SV Index:   40 LVOT Area:     3.80 cm  LV Volumes (MOD) LV vol d, MOD A2C: 71.2 ml LV vol d, MOD A4C: 82.4 ml LV vol s, MOD A2C: 20.4 ml LV vol s, MOD A4C: 25.2 ml LV SV MOD A2C:     50.8 ml LV SV MOD A4C:     82.4  ml LV SV MOD BP:      55.3 ml  RIGHT VENTRICLE             IVC RV S prime:     15.80 cm/s  IVC diam: 1.80 cm TAPSE (M-mode): 1.8 cm  LEFT ATRIUM             Index        RIGHT ATRIUM           Index LA diam:        5.70 cm 2.41 cm/m   RA Area:     17.40 cm LA Vol (A2C):   37.0 ml 15.65 ml/m  RA Volume:   41.50 ml  17.55 ml/m LA Vol (A4C):   99.3 ml 42.00 ml/m LA Biplane Vol: 61.5 ml 26.01 ml/m AORTIC VALVE AV Area (Vmax):    1.70 cm AV Area (Vmean):   1.57 cm AV Area (VTI):     1.68 cm AV Vmax:           289.00 cm/s AV Vmean:          197.500 cm/s AV VTI:            0.568 m AV Peak Grad:      33.4 mmHg AV Mean Grad:      19.0 mmHg LVOT Vmax:         129.00 cm/s LVOT Vmean:        81.600 cm/s LVOT VTI:          0.251 m LVOT/AV VTI ratio: 0.44  AORTA Ao Root diam: 3.30 cm Ao Asc diam:  3.70 cm  MITRAL VALVE                TRICUSPID VALVE MV Area (PHT): 1.83 cm     TR Peak grad:   76.4 mmHg MV Area  VTI:   1.26 cm     TR Vmax:        437.00 cm/s MV Peak grad:  27.7 mmHg MV Mean grad:  19.0 mmHg    SHUNTS MV Vmax:       2.63 m/s     Systemic VTI:  0.25 m MV Vmean:      199.3 cm/s   Systemic Diam: 2.20 cm MV Decel Time: 415 msec MV E velocity: 205.00 cm/s MV A velocity: 210.00 cm/s MV E/A ratio:  0.98  Jodelle Red MD Electronically signed by Jodelle Red MD Signature Date/Time: 01/06/2024/4:55:47 PM    Final          ______________________________________________________________________________________________      Risk Assessment/Calculations:    CHA2DS2-VASc Score = 6   This indicates a 9.7% annual risk of stroke. The patient's score is based upon: CHF History: 1 HTN History: 1 Diabetes History: 0 Stroke History: 0 Vascular Disease History: 1 Age Score: 2 Gender Score: 1            Physical Exam:   VS:  BP (!) 90/46 (BP Location: Left Arm, Patient Position: Sitting, Cuff Size: Large)   Pulse 78   Ht 5\' 8"  (1.727 m)    Wt 265 lb (120.2 kg)   SpO2 96%   BMI 40.29 kg/m    Wt Readings from Last 3 Encounters:  01/23/24 265 lb (120.2 kg)  01/14/24 270 lb 11.6 oz (122.8 kg)  12/28/23 275 lb (124.7 kg)    GEN: Well nourished, well developed in no acute distress NECK: No JVD; No carotid bruits CARDIAC: RRR, no murmurs, rubs, gallops RESPIRATORY:  Clear to auscultation without rales, wheezing or rhonchi  ABDOMEN: Soft, non-tender, non-distended EXTREMITIES:  No edema; No deformity   ASSESSMENT AND PLAN: .    CAD Recent episodes of chest tightness, resolved with gas relief measures.  Previous anginal symptom was left arm pain only. -Monitor symptoms.  Given her frailty and comorbidities, would like to avoid invasive workup. -Order stress test if symptoms persist at 3-week follow-up.  Severe Mitral Stenosis Persistent AFib despite DC cardioversion and amiodarone therapy. Recent hospitalization with shortness of breath and elevated LVOT gradient. Not a candidate for invasive valvular therapy. -Continue current medications including amiodarone and metoprolol succinate. -Return for reassessment in 3 weeks.  Hypertrophic cardiomyopathy: Seen on recent echocardiogram.  Significant LVOT gradient.  Hypertension: Blood pressure initially low, however on repeat check, blood pressure was normal.  History of TAVR: stable on last echocardiogram  Persistent atrial fibrillation: Maintaining sinus rhythm on amiodarone and metoprolol succinate        Dispo: Follow-up in 3 weeks for reassessment of chest pain.  Signed, Azalee Course, PA

## 2024-01-23 NOTE — Patient Instructions (Signed)
 Medication Instructions:  NO CHANGES *If you need a refill on your cardiac medications before your next appointment, please call your pharmacy*   Lab Work: NO LABS If you have labs (blood work) drawn today and your tests are completely normal, you will receive your results only by: MyChart Message (if you have MyChart) OR A paper copy in the mail If you have any lab test that is abnormal or we need to change your treatment, we will call you to review the results.   Testing/Procedures: NO TESTING   Follow-Up: At [facility], you and your health needs are our priority.  As part of our continuing mission to provide you with exceptional heart care, we have created designated Provider Care Teams.  These Care Teams include your primary Cardiologist (physician) and Advanced Practice Providers (APPs -  Physician Assistants and Nurse Practitioners) who all work together to provide you with the care you need, when you need it.  Your next appointment:   3 week(s)  Provider:   Azalee Course, PA

## 2024-01-30 ENCOUNTER — Ambulatory Visit (HOSPITAL_BASED_OUTPATIENT_CLINIC_OR_DEPARTMENT_OTHER): Payer: Medicare Other | Admitting: Pulmonary Disease

## 2024-01-30 ENCOUNTER — Ambulatory Visit: Payer: Medicare Other | Admitting: Physician Assistant

## 2024-02-04 ENCOUNTER — Encounter (HOSPITAL_BASED_OUTPATIENT_CLINIC_OR_DEPARTMENT_OTHER): Payer: Medicare Other

## 2024-02-14 ENCOUNTER — Ambulatory Visit: Payer: Medicare Other | Admitting: Physician Assistant

## 2024-02-14 ENCOUNTER — Encounter (HOSPITAL_BASED_OUTPATIENT_CLINIC_OR_DEPARTMENT_OTHER): Payer: Medicare Other | Admitting: Internal Medicine

## 2024-02-28 ENCOUNTER — Ambulatory Visit: Payer: Medicare Other | Admitting: Cardiology

## 2024-03-03 DEATH — deceased

## 2024-05-13 ENCOUNTER — Ambulatory Visit (HOSPITAL_COMMUNITY): Payer: Medicare Other | Admitting: Physician Assistant
